# Patient Record
Sex: Male | Born: 1955
Health system: Southern US, Community
[De-identification: ages and names within clinical notes are randomized; demographics above are authoritative.]

## PROBLEM LIST (undated history)

## (undated) DIAGNOSIS — Z21 Asymptomatic human immunodeficiency virus [HIV] infection status: Secondary | ICD-10-CM

## (undated) DIAGNOSIS — E7800 Pure hypercholesterolemia, unspecified: Secondary | ICD-10-CM

## (undated) DIAGNOSIS — Z131 Encounter for screening for diabetes mellitus: Secondary | ICD-10-CM

## (undated) DIAGNOSIS — H269 Unspecified cataract: Secondary | ICD-10-CM

## (undated) DIAGNOSIS — B20 Human immunodeficiency virus [HIV] disease: Secondary | ICD-10-CM

## (undated) DIAGNOSIS — I10 Essential (primary) hypertension: Secondary | ICD-10-CM

## (undated) DIAGNOSIS — E119 Type 2 diabetes mellitus without complications: Secondary | ICD-10-CM

## (undated) DIAGNOSIS — F32A Depression, unspecified: Secondary | ICD-10-CM

## (undated) DIAGNOSIS — F419 Anxiety disorder, unspecified: Secondary | ICD-10-CM

## (undated) DIAGNOSIS — F329 Major depressive disorder, single episode, unspecified: Secondary | ICD-10-CM

## (undated) DIAGNOSIS — E785 Hyperlipidemia, unspecified: Secondary | ICD-10-CM

## (undated) HISTORY — DX: Asymptomatic human immunodeficiency virus (hiv) infection status: Z21

## (undated) HISTORY — DX: Encounter for screening for diabetes mellitus: Z13.1

## (undated) HISTORY — DX: Hyperlipidemia, unspecified: E78.5

## (undated) HISTORY — DX: Essential (primary) hypertension: I10

## (undated) HISTORY — DX: Human immunodeficiency virus (HIV) disease: B20

---

## 1995-09-12 DIAGNOSIS — B20 Human immunodeficiency virus [HIV] disease: Secondary | ICD-10-CM

## 1995-09-12 HISTORY — DX: Human immunodeficiency virus (HIV) disease: B20

## 1997-05-15 ENCOUNTER — Encounter: Admission: RE | Admit: 1997-05-15 | Discharge: 1997-05-15 | Payer: Self-pay | Admitting: Internal Medicine

## 1997-06-17 ENCOUNTER — Encounter: Admission: RE | Admit: 1997-06-17 | Discharge: 1997-06-17 | Payer: Self-pay | Admitting: Internal Medicine

## 1997-08-24 ENCOUNTER — Encounter: Admission: RE | Admit: 1997-08-24 | Discharge: 1997-08-24 | Payer: Self-pay | Admitting: Internal Medicine

## 1997-10-05 ENCOUNTER — Encounter: Admission: RE | Admit: 1997-10-05 | Discharge: 1997-10-05 | Payer: Self-pay | Admitting: Internal Medicine

## 1997-10-18 ENCOUNTER — Emergency Department (HOSPITAL_COMMUNITY): Admission: EM | Admit: 1997-10-18 | Discharge: 1997-10-18 | Payer: Self-pay | Admitting: Internal Medicine

## 1997-10-18 ENCOUNTER — Encounter: Payer: Self-pay | Admitting: Internal Medicine

## 1997-10-23 ENCOUNTER — Encounter: Admission: RE | Admit: 1997-10-23 | Discharge: 1997-10-23 | Payer: Self-pay | Admitting: Internal Medicine

## 1997-11-18 ENCOUNTER — Encounter: Admission: RE | Admit: 1997-11-18 | Discharge: 1997-11-18 | Payer: Self-pay | Admitting: Hematology and Oncology

## 1997-12-16 ENCOUNTER — Encounter: Admission: RE | Admit: 1997-12-16 | Discharge: 1997-12-16 | Payer: Self-pay | Admitting: Internal Medicine

## 1998-01-18 ENCOUNTER — Encounter: Admission: RE | Admit: 1998-01-18 | Discharge: 1998-01-18 | Payer: Self-pay | Admitting: Internal Medicine

## 1998-02-10 ENCOUNTER — Encounter: Admission: RE | Admit: 1998-02-10 | Discharge: 1998-02-10 | Payer: Self-pay | Admitting: Internal Medicine

## 1998-04-29 ENCOUNTER — Ambulatory Visit (HOSPITAL_COMMUNITY): Admission: RE | Admit: 1998-04-29 | Discharge: 1998-04-29 | Payer: Self-pay | Admitting: Internal Medicine

## 1998-04-29 ENCOUNTER — Encounter: Admission: RE | Admit: 1998-04-29 | Discharge: 1998-04-29 | Payer: Self-pay | Admitting: Internal Medicine

## 1998-05-26 ENCOUNTER — Encounter: Admission: RE | Admit: 1998-05-26 | Discharge: 1998-05-26 | Payer: Self-pay | Admitting: Internal Medicine

## 1998-07-20 ENCOUNTER — Encounter: Admission: RE | Admit: 1998-07-20 | Discharge: 1998-07-20 | Payer: Self-pay | Admitting: Internal Medicine

## 1998-09-21 ENCOUNTER — Encounter: Admission: RE | Admit: 1998-09-21 | Discharge: 1998-09-21 | Payer: Self-pay | Admitting: Internal Medicine

## 1998-11-05 ENCOUNTER — Encounter: Admission: RE | Admit: 1998-11-05 | Discharge: 1998-11-05 | Payer: Self-pay | Admitting: Internal Medicine

## 1999-01-11 ENCOUNTER — Ambulatory Visit (HOSPITAL_COMMUNITY): Admission: RE | Admit: 1999-01-11 | Discharge: 1999-01-11 | Payer: Self-pay | Admitting: Internal Medicine

## 1999-01-11 ENCOUNTER — Encounter: Admission: RE | Admit: 1999-01-11 | Discharge: 1999-01-11 | Payer: Self-pay | Admitting: Internal Medicine

## 1999-01-18 ENCOUNTER — Encounter: Admission: RE | Admit: 1999-01-18 | Discharge: 1999-01-18 | Payer: Self-pay | Admitting: Internal Medicine

## 1999-02-01 ENCOUNTER — Encounter: Admission: RE | Admit: 1999-02-01 | Discharge: 1999-02-01 | Payer: Self-pay | Admitting: Hematology and Oncology

## 1999-02-04 ENCOUNTER — Encounter: Admission: RE | Admit: 1999-02-04 | Discharge: 1999-02-04 | Payer: Self-pay | Admitting: Internal Medicine

## 1999-02-08 ENCOUNTER — Encounter: Admission: RE | Admit: 1999-02-08 | Discharge: 1999-02-08 | Payer: Self-pay | Admitting: Internal Medicine

## 1999-02-15 ENCOUNTER — Encounter: Admission: RE | Admit: 1999-02-15 | Discharge: 1999-02-15 | Payer: Self-pay | Admitting: Internal Medicine

## 1999-03-01 ENCOUNTER — Encounter: Admission: RE | Admit: 1999-03-01 | Discharge: 1999-03-01 | Payer: Self-pay | Admitting: Internal Medicine

## 1999-03-07 ENCOUNTER — Encounter (HOSPITAL_COMMUNITY): Admission: RE | Admit: 1999-03-07 | Discharge: 1999-06-05 | Payer: Self-pay | Admitting: Dentistry

## 1999-04-07 ENCOUNTER — Ambulatory Visit (HOSPITAL_COMMUNITY): Admission: RE | Admit: 1999-04-07 | Discharge: 1999-04-07 | Payer: Self-pay | Admitting: Internal Medicine

## 1999-04-07 ENCOUNTER — Encounter: Admission: RE | Admit: 1999-04-07 | Discharge: 1999-04-07 | Payer: Self-pay | Admitting: Internal Medicine

## 1999-04-28 ENCOUNTER — Encounter: Admission: RE | Admit: 1999-04-28 | Discharge: 1999-04-28 | Payer: Self-pay | Admitting: Internal Medicine

## 1999-05-06 ENCOUNTER — Encounter: Admission: RE | Admit: 1999-05-06 | Discharge: 1999-05-06 | Payer: Self-pay | Admitting: Internal Medicine

## 1999-05-10 ENCOUNTER — Encounter: Admission: RE | Admit: 1999-05-10 | Discharge: 1999-05-10 | Payer: Self-pay | Admitting: Internal Medicine

## 1999-05-26 ENCOUNTER — Encounter: Admission: RE | Admit: 1999-05-26 | Discharge: 1999-05-26 | Payer: Self-pay | Admitting: Internal Medicine

## 1999-06-12 ENCOUNTER — Encounter: Payer: Self-pay | Admitting: Emergency Medicine

## 1999-06-12 ENCOUNTER — Emergency Department (HOSPITAL_COMMUNITY): Admission: EM | Admit: 1999-06-12 | Discharge: 1999-06-12 | Payer: Self-pay | Admitting: Emergency Medicine

## 1999-06-14 ENCOUNTER — Encounter: Admission: RE | Admit: 1999-06-14 | Discharge: 1999-06-14 | Payer: Self-pay | Admitting: Internal Medicine

## 1999-09-20 ENCOUNTER — Encounter: Admission: RE | Admit: 1999-09-20 | Discharge: 1999-09-20 | Payer: Self-pay | Admitting: Internal Medicine

## 1999-09-20 ENCOUNTER — Ambulatory Visit (HOSPITAL_COMMUNITY): Admission: RE | Admit: 1999-09-20 | Discharge: 1999-09-20 | Payer: Self-pay | Admitting: Internal Medicine

## 1999-09-30 ENCOUNTER — Encounter (HOSPITAL_COMMUNITY): Admission: RE | Admit: 1999-09-30 | Discharge: 1999-12-29 | Payer: Self-pay | Admitting: Dentistry

## 1999-11-10 ENCOUNTER — Encounter: Admission: RE | Admit: 1999-11-10 | Discharge: 1999-11-10 | Payer: Self-pay | Admitting: Hematology and Oncology

## 1999-12-13 ENCOUNTER — Encounter: Admission: RE | Admit: 1999-12-13 | Discharge: 1999-12-13 | Payer: Self-pay | Admitting: Internal Medicine

## 1999-12-13 ENCOUNTER — Ambulatory Visit (HOSPITAL_COMMUNITY): Admission: RE | Admit: 1999-12-13 | Discharge: 1999-12-13 | Payer: Self-pay | Admitting: Internal Medicine

## 1999-12-27 ENCOUNTER — Encounter: Admission: RE | Admit: 1999-12-27 | Discharge: 1999-12-27 | Payer: Self-pay | Admitting: Internal Medicine

## 2000-01-24 ENCOUNTER — Encounter: Admission: RE | Admit: 2000-01-24 | Discharge: 2000-01-24 | Payer: Self-pay | Admitting: Internal Medicine

## 2000-02-03 ENCOUNTER — Encounter (HOSPITAL_COMMUNITY): Admission: RE | Admit: 2000-02-03 | Discharge: 2000-05-03 | Payer: Self-pay | Admitting: Dentistry

## 2000-02-09 ENCOUNTER — Encounter: Admission: RE | Admit: 2000-02-09 | Discharge: 2000-02-09 | Payer: Self-pay | Admitting: Hematology and Oncology

## 2000-03-06 ENCOUNTER — Encounter: Admission: RE | Admit: 2000-03-06 | Discharge: 2000-03-06 | Payer: Self-pay | Admitting: Internal Medicine

## 2000-03-06 ENCOUNTER — Ambulatory Visit (HOSPITAL_COMMUNITY): Admission: RE | Admit: 2000-03-06 | Discharge: 2000-03-06 | Payer: Self-pay | Admitting: Internal Medicine

## 2000-03-15 ENCOUNTER — Ambulatory Visit (HOSPITAL_COMMUNITY): Admission: RE | Admit: 2000-03-15 | Discharge: 2000-03-15 | Payer: Self-pay | Admitting: Internal Medicine

## 2000-04-17 ENCOUNTER — Encounter: Admission: RE | Admit: 2000-04-17 | Discharge: 2000-04-17 | Payer: Self-pay | Admitting: Internal Medicine

## 2000-06-12 ENCOUNTER — Encounter: Admission: RE | Admit: 2000-06-12 | Discharge: 2000-06-12 | Payer: Self-pay | Admitting: Internal Medicine

## 2000-07-09 ENCOUNTER — Encounter (HOSPITAL_COMMUNITY): Admission: RE | Admit: 2000-07-09 | Discharge: 2000-10-07 | Payer: Self-pay | Admitting: Dentistry

## 2000-07-27 ENCOUNTER — Encounter: Admission: RE | Admit: 2000-07-27 | Discharge: 2000-07-27 | Payer: Self-pay | Admitting: Internal Medicine

## 2000-07-27 ENCOUNTER — Ambulatory Visit (HOSPITAL_COMMUNITY): Admission: RE | Admit: 2000-07-27 | Discharge: 2000-07-27 | Payer: Self-pay | Admitting: Internal Medicine

## 2000-09-17 ENCOUNTER — Encounter: Admission: RE | Admit: 2000-09-17 | Discharge: 2000-09-17 | Payer: Self-pay | Admitting: Internal Medicine

## 2000-09-25 ENCOUNTER — Encounter: Admission: RE | Admit: 2000-09-25 | Discharge: 2000-12-24 | Payer: Self-pay | Admitting: Internal Medicine

## 2000-11-05 ENCOUNTER — Encounter: Admission: RE | Admit: 2000-11-05 | Discharge: 2000-11-05 | Payer: Self-pay

## 2001-02-11 ENCOUNTER — Encounter: Admission: RE | Admit: 2001-02-11 | Discharge: 2001-02-11 | Payer: Self-pay | Admitting: Internal Medicine

## 2001-02-11 ENCOUNTER — Ambulatory Visit (HOSPITAL_COMMUNITY): Admission: RE | Admit: 2001-02-11 | Discharge: 2001-02-11 | Payer: Self-pay | Admitting: Internal Medicine

## 2001-02-22 ENCOUNTER — Encounter (HOSPITAL_COMMUNITY): Admission: RE | Admit: 2001-02-22 | Discharge: 2001-05-23 | Payer: Self-pay | Admitting: Dentistry

## 2001-03-18 ENCOUNTER — Encounter: Admission: RE | Admit: 2001-03-18 | Discharge: 2001-03-18 | Payer: Self-pay | Admitting: Internal Medicine

## 2001-04-15 ENCOUNTER — Encounter: Admission: RE | Admit: 2001-04-15 | Discharge: 2001-04-15 | Payer: Self-pay | Admitting: Internal Medicine

## 2001-05-01 ENCOUNTER — Encounter: Admission: RE | Admit: 2001-05-01 | Discharge: 2001-05-01 | Payer: Self-pay | Admitting: Internal Medicine

## 2001-07-01 ENCOUNTER — Encounter: Admission: RE | Admit: 2001-07-01 | Discharge: 2001-07-01 | Payer: Self-pay | Admitting: Internal Medicine

## 2001-07-01 ENCOUNTER — Ambulatory Visit (HOSPITAL_COMMUNITY): Admission: RE | Admit: 2001-07-01 | Discharge: 2001-07-01 | Payer: Self-pay | Admitting: Internal Medicine

## 2001-08-01 ENCOUNTER — Encounter: Admission: RE | Admit: 2001-08-01 | Discharge: 2001-08-01 | Payer: Self-pay | Admitting: Internal Medicine

## 2001-10-14 ENCOUNTER — Ambulatory Visit (HOSPITAL_COMMUNITY): Admission: RE | Admit: 2001-10-14 | Discharge: 2001-10-14 | Payer: Self-pay | Admitting: Internal Medicine

## 2001-10-14 ENCOUNTER — Encounter: Admission: RE | Admit: 2001-10-14 | Discharge: 2001-10-14 | Payer: Self-pay | Admitting: Internal Medicine

## 2001-10-14 ENCOUNTER — Encounter (INDEPENDENT_AMBULATORY_CARE_PROVIDER_SITE_OTHER): Payer: Self-pay | Admitting: *Deleted

## 2001-10-14 LAB — CONVERTED CEMR LAB: HIV 1 RNA Quant: 97 copies/mL

## 2001-12-09 ENCOUNTER — Encounter: Admission: RE | Admit: 2001-12-09 | Discharge: 2001-12-09 | Payer: Self-pay | Admitting: Internal Medicine

## 2001-12-16 ENCOUNTER — Encounter: Admission: RE | Admit: 2001-12-16 | Discharge: 2001-12-16 | Payer: Self-pay | Admitting: Internal Medicine

## 2002-01-29 ENCOUNTER — Ambulatory Visit (HOSPITAL_COMMUNITY): Admission: RE | Admit: 2002-01-29 | Discharge: 2002-01-29 | Payer: Self-pay | Admitting: Internal Medicine

## 2002-01-29 ENCOUNTER — Encounter (INDEPENDENT_AMBULATORY_CARE_PROVIDER_SITE_OTHER): Payer: Self-pay | Admitting: *Deleted

## 2002-01-29 ENCOUNTER — Encounter: Admission: RE | Admit: 2002-01-29 | Discharge: 2002-01-29 | Payer: Self-pay | Admitting: Internal Medicine

## 2002-01-29 LAB — CONVERTED CEMR LAB: CD4 Count: 620 microliters

## 2002-02-17 ENCOUNTER — Encounter: Admission: RE | Admit: 2002-02-17 | Discharge: 2002-02-17 | Payer: Self-pay | Admitting: Internal Medicine

## 2002-03-31 ENCOUNTER — Encounter: Admission: RE | Admit: 2002-03-31 | Discharge: 2002-03-31 | Payer: Self-pay | Admitting: Internal Medicine

## 2002-03-31 ENCOUNTER — Encounter (INDEPENDENT_AMBULATORY_CARE_PROVIDER_SITE_OTHER): Payer: Self-pay | Admitting: *Deleted

## 2002-03-31 LAB — CONVERTED CEMR LAB: CD4 Count: 590 microliters

## 2002-05-06 ENCOUNTER — Encounter: Admission: RE | Admit: 2002-05-06 | Discharge: 2002-05-06 | Payer: Self-pay | Admitting: Dentistry

## 2002-05-19 ENCOUNTER — Encounter: Admission: RE | Admit: 2002-05-19 | Discharge: 2002-05-19 | Payer: Self-pay | Admitting: Internal Medicine

## 2002-07-21 ENCOUNTER — Encounter: Payer: Self-pay | Admitting: Internal Medicine

## 2002-07-21 ENCOUNTER — Encounter: Admission: RE | Admit: 2002-07-21 | Discharge: 2002-07-21 | Payer: Self-pay | Admitting: Internal Medicine

## 2002-07-21 ENCOUNTER — Ambulatory Visit (HOSPITAL_COMMUNITY): Admission: RE | Admit: 2002-07-21 | Discharge: 2002-07-21 | Payer: Self-pay | Admitting: Internal Medicine

## 2002-07-25 ENCOUNTER — Encounter: Admission: RE | Admit: 2002-07-25 | Discharge: 2002-07-25 | Payer: Self-pay | Admitting: Internal Medicine

## 2002-09-18 ENCOUNTER — Encounter: Admission: RE | Admit: 2002-09-18 | Discharge: 2002-09-18 | Payer: Self-pay | Admitting: Internal Medicine

## 2002-10-15 ENCOUNTER — Encounter: Admission: RE | Admit: 2002-10-15 | Discharge: 2002-10-15 | Payer: Self-pay | Admitting: Internal Medicine

## 2002-11-27 ENCOUNTER — Encounter: Admission: RE | Admit: 2002-11-27 | Discharge: 2002-11-27 | Payer: Self-pay | Admitting: Internal Medicine

## 2002-11-30 ENCOUNTER — Encounter (INDEPENDENT_AMBULATORY_CARE_PROVIDER_SITE_OTHER): Payer: Self-pay | Admitting: *Deleted

## 2002-11-30 LAB — CONVERTED CEMR LAB: CD4 T Cell Abs: 620

## 2003-01-10 HISTORY — PX: OTHER SURGICAL HISTORY: SHX169

## 2003-01-10 HISTORY — PX: EYE SURGERY: SHX253

## 2003-01-22 ENCOUNTER — Ambulatory Visit (HOSPITAL_COMMUNITY): Admission: RE | Admit: 2003-01-22 | Discharge: 2003-01-22 | Payer: Self-pay | Admitting: Internal Medicine

## 2003-01-22 ENCOUNTER — Encounter: Admission: RE | Admit: 2003-01-22 | Discharge: 2003-01-22 | Payer: Self-pay | Admitting: Internal Medicine

## 2003-02-27 ENCOUNTER — Encounter (HOSPITAL_COMMUNITY): Admission: RE | Admit: 2003-02-27 | Discharge: 2003-02-27 | Payer: Self-pay | Admitting: Dentistry

## 2003-03-12 ENCOUNTER — Encounter: Admission: RE | Admit: 2003-03-12 | Discharge: 2003-03-12 | Payer: Self-pay | Admitting: Internal Medicine

## 2003-05-15 ENCOUNTER — Encounter: Admission: RE | Admit: 2003-05-15 | Discharge: 2003-05-15 | Payer: Self-pay | Admitting: Internal Medicine

## 2003-05-22 ENCOUNTER — Encounter: Admission: RE | Admit: 2003-05-22 | Discharge: 2003-05-22 | Payer: Self-pay | Admitting: Internal Medicine

## 2003-05-22 ENCOUNTER — Ambulatory Visit (HOSPITAL_COMMUNITY): Admission: RE | Admit: 2003-05-22 | Discharge: 2003-05-22 | Payer: Self-pay | Admitting: Internal Medicine

## 2003-07-07 ENCOUNTER — Encounter: Admission: RE | Admit: 2003-07-07 | Discharge: 2003-07-07 | Payer: Self-pay | Admitting: Internal Medicine

## 2003-09-28 ENCOUNTER — Ambulatory Visit: Payer: Self-pay | Admitting: Internal Medicine

## 2003-10-09 ENCOUNTER — Ambulatory Visit: Payer: Self-pay | Admitting: Dentistry

## 2003-10-14 ENCOUNTER — Ambulatory Visit: Payer: Self-pay | Admitting: Internal Medicine

## 2003-10-15 ENCOUNTER — Ambulatory Visit: Payer: Self-pay | Admitting: *Deleted

## 2003-11-25 ENCOUNTER — Ambulatory Visit: Payer: Self-pay | Admitting: Internal Medicine

## 2004-01-06 ENCOUNTER — Ambulatory Visit: Payer: Self-pay | Admitting: Internal Medicine

## 2004-03-04 ENCOUNTER — Ambulatory Visit: Payer: Self-pay | Admitting: Internal Medicine

## 2004-04-29 ENCOUNTER — Ambulatory Visit: Payer: Self-pay | Admitting: Dentistry

## 2004-04-29 ENCOUNTER — Encounter: Admission: RE | Admit: 2004-04-29 | Discharge: 2004-04-29 | Payer: Self-pay | Admitting: Dentistry

## 2004-05-31 ENCOUNTER — Ambulatory Visit: Payer: Self-pay | Admitting: Internal Medicine

## 2004-07-19 ENCOUNTER — Ambulatory Visit: Payer: Self-pay | Admitting: Internal Medicine

## 2004-08-20 ENCOUNTER — Emergency Department (HOSPITAL_COMMUNITY): Admission: EM | Admit: 2004-08-20 | Discharge: 2004-08-20 | Payer: Self-pay | Admitting: Emergency Medicine

## 2004-09-14 ENCOUNTER — Ambulatory Visit: Payer: Self-pay | Admitting: Internal Medicine

## 2004-11-15 ENCOUNTER — Ambulatory Visit: Payer: Self-pay | Admitting: Internal Medicine

## 2004-11-29 ENCOUNTER — Ambulatory Visit: Payer: Self-pay | Admitting: Internal Medicine

## 2005-01-06 ENCOUNTER — Ambulatory Visit: Payer: Self-pay | Admitting: Internal Medicine

## 2005-01-20 ENCOUNTER — Ambulatory Visit: Payer: Self-pay | Admitting: Dentistry

## 2005-07-21 ENCOUNTER — Ambulatory Visit: Payer: Self-pay | Admitting: Dentistry

## 2005-07-28 ENCOUNTER — Ambulatory Visit: Payer: Self-pay | Admitting: Internal Medicine

## 2005-08-25 ENCOUNTER — Ambulatory Visit: Payer: Self-pay | Admitting: Internal Medicine

## 2005-11-06 ENCOUNTER — Ambulatory Visit: Payer: Self-pay | Admitting: Internal Medicine

## 2005-12-11 ENCOUNTER — Ambulatory Visit: Payer: Self-pay | Admitting: Internal Medicine

## 2006-01-29 ENCOUNTER — Ambulatory Visit: Payer: Self-pay | Admitting: Internal Medicine

## 2006-02-01 ENCOUNTER — Ambulatory Visit: Payer: Self-pay | Admitting: Dentistry

## 2006-02-26 ENCOUNTER — Ambulatory Visit: Payer: Self-pay | Admitting: Internal Medicine

## 2006-03-05 ENCOUNTER — Encounter (INDEPENDENT_AMBULATORY_CARE_PROVIDER_SITE_OTHER): Payer: Self-pay | Admitting: *Deleted

## 2006-03-05 LAB — CONVERTED CEMR LAB

## 2006-03-18 ENCOUNTER — Encounter (INDEPENDENT_AMBULATORY_CARE_PROVIDER_SITE_OTHER): Payer: Self-pay | Admitting: *Deleted

## 2006-03-29 ENCOUNTER — Ambulatory Visit: Payer: Self-pay | Admitting: Internal Medicine

## 2006-04-09 ENCOUNTER — Ambulatory Visit: Payer: Self-pay | Admitting: Internal Medicine

## 2006-06-11 ENCOUNTER — Ambulatory Visit: Payer: Self-pay | Admitting: Internal Medicine

## 2006-08-14 ENCOUNTER — Emergency Department (HOSPITAL_COMMUNITY): Admission: EM | Admit: 2006-08-14 | Discharge: 2006-08-14 | Payer: Self-pay | Admitting: Emergency Medicine

## 2006-08-22 ENCOUNTER — Ambulatory Visit: Payer: Self-pay | Admitting: Internal Medicine

## 2006-08-31 ENCOUNTER — Ambulatory Visit: Payer: Self-pay | Admitting: Dentistry

## 2006-09-12 ENCOUNTER — Ambulatory Visit: Payer: Self-pay | Admitting: Internal Medicine

## 2006-09-12 LAB — CONVERTED CEMR LAB
BUN: 13 mg/dL (ref 6–23)
Chloride: 103 meq/L (ref 96–112)
Creatinine, Ser: 0.85 mg/dL (ref 0.40–1.50)
Glucose, Bld: 173 mg/dL — ABNORMAL HIGH (ref 70–99)
Potassium: 4.7 meq/L (ref 3.5–5.3)

## 2006-09-18 ENCOUNTER — Ambulatory Visit (HOSPITAL_COMMUNITY): Admission: RE | Admit: 2006-09-18 | Discharge: 2006-09-18 | Payer: Self-pay | Admitting: Internal Medicine

## 2006-10-01 ENCOUNTER — Ambulatory Visit: Payer: Self-pay | Admitting: Internal Medicine

## 2006-10-19 ENCOUNTER — Ambulatory Visit: Payer: Self-pay | Admitting: Internal Medicine

## 2006-12-12 ENCOUNTER — Ambulatory Visit: Payer: Self-pay | Admitting: Internal Medicine

## 2006-12-13 ENCOUNTER — Encounter (INDEPENDENT_AMBULATORY_CARE_PROVIDER_SITE_OTHER): Payer: Self-pay | Admitting: Internal Medicine

## 2006-12-13 LAB — CONVERTED CEMR LAB
ALT: 33 units/L (ref 0–53)
AST: 40 units/L — ABNORMAL HIGH (ref 0–37)
Absolute CD4: 599 #/uL (ref 381–1469)
Albumin: 4.5 g/dL (ref 3.5–5.2)
Alkaline Phosphatase: 53 units/L (ref 39–117)
Basophils Absolute: 0 10*3/uL (ref 0.0–0.1)
Basophils Relative: 0 % (ref 0–1)
CD4 T Helper %: 25 % — ABNORMAL LOW (ref 32–62)
Eosinophils Absolute: 0.1 10*3/uL — ABNORMAL LOW (ref 0.2–0.7)
MCHC: 34.4 g/dL (ref 30.0–36.0)
MCV: 110 fL — ABNORMAL HIGH (ref 78.0–100.0)
Neutrophils Relative %: 53 % (ref 43–77)
Platelets: 144 10*3/uL — ABNORMAL LOW (ref 150–400)
Potassium: 4.2 meq/L (ref 3.5–5.3)
Sodium: 140 meq/L (ref 135–145)
Total Protein: 8 g/dL (ref 6.0–8.3)
WBC, lymph enumeration: 6.3 10*3/uL (ref 4.0–10.5)
WBC: 6.3 10*3/uL (ref 4.0–10.5)

## 2007-02-06 ENCOUNTER — Ambulatory Visit: Payer: Self-pay | Admitting: Internal Medicine

## 2007-03-27 ENCOUNTER — Ambulatory Visit: Payer: Self-pay | Admitting: Internal Medicine

## 2007-05-15 ENCOUNTER — Ambulatory Visit: Payer: Self-pay | Admitting: Internal Medicine

## 2007-05-16 ENCOUNTER — Encounter (INDEPENDENT_AMBULATORY_CARE_PROVIDER_SITE_OTHER): Payer: Self-pay | Admitting: Internal Medicine

## 2007-05-16 LAB — CONVERTED CEMR LAB
Absolute CD4: 692 #/uL (ref 381–1469)
Cholesterol: 151 mg/dL (ref 0–200)
HDL: 46 mg/dL (ref >39)
Microalb, Ur: 0.86 mg/dL (ref 0.00–1.89)
Total Lymphocytes %: 43 % (ref 12–46)
Triglycerides: 91 mg/dL (ref <150)
WBC, lymph enumeration: 7 10*3/uL (ref 4.0–10.5)

## 2007-07-17 ENCOUNTER — Ambulatory Visit: Payer: Self-pay | Admitting: Internal Medicine

## 2007-09-18 ENCOUNTER — Ambulatory Visit: Payer: Self-pay | Admitting: Internal Medicine

## 2007-09-18 LAB — CONVERTED CEMR LAB
Basophils Absolute: 0 10*3/uL (ref 0.0–0.1)
HIV 1 RNA Quant: 161 copies/mL — ABNORMAL HIGH (ref <50)
HIV-1 RNA Quant, Log: 2.21 — ABNORMAL HIGH (ref <1.70)
Hemoglobin: 15.4 g/dL (ref 13.0–17.0)
Lymphocytes Relative: 36 % (ref 12–46)
Monocytes Absolute: 0.5 10*3/uL (ref 0.1–1.0)
Neutro Abs: 4.2 10*3/uL (ref 1.7–7.7)
Platelets: 146 10*3/uL — ABNORMAL LOW (ref 150–400)
RDW: 14 % (ref 11.5–15.5)
Total Lymphocytes %: 36 % (ref 12–46)
WBC, lymph enumeration: 7.5 10*3/uL (ref 4.0–10.5)

## 2007-11-21 ENCOUNTER — Ambulatory Visit: Payer: Self-pay | Admitting: Internal Medicine

## 2007-11-21 LAB — CONVERTED CEMR LAB
ALT: 29 units/L (ref 0–53)
Absolute CD4: 693 #/uL (ref 381–1469)
Alkaline Phosphatase: 53 units/L (ref 39–117)
Basophils Absolute: 0 10*3/uL (ref 0.0–0.1)
Creatinine, Ser: 0.85 mg/dL (ref 0.40–1.50)
Eosinophils Absolute: 0.1 10*3/uL (ref 0.0–0.7)
Eosinophils Relative: 1 % (ref 0–5)
Glucose, Bld: 267 mg/dL — ABNORMAL HIGH (ref 70–99)
HCT: 45.3 % (ref 39.0–52.0)
LDL Cholesterol: 89 mg/dL (ref 0–99)
MCHC: 35.3 g/dL (ref 30.0–36.0)
MCV: 110.8 fL — ABNORMAL HIGH (ref 78.0–100.0)
Platelets: 168 10*3/uL (ref 150–400)
RDW: 14 % (ref 11.5–15.5)
Sodium: 137 meq/L (ref 135–145)
Total Bilirubin: 0.3 mg/dL (ref 0.3–1.2)
Total CHOL/HDL Ratio: 3.6
Total Protein: 8.2 g/dL (ref 6.0–8.3)
Triglycerides: 147 mg/dL (ref <150)
VLDL: 29 mg/dL (ref 0–40)
WBC, lymph enumeration: 6.5 10*3/uL (ref 4.0–10.5)

## 2008-01-15 ENCOUNTER — Ambulatory Visit: Payer: Self-pay | Admitting: Internal Medicine

## 2008-01-16 ENCOUNTER — Encounter (INDEPENDENT_AMBULATORY_CARE_PROVIDER_SITE_OTHER): Payer: Self-pay | Admitting: Internal Medicine

## 2008-01-16 LAB — CONVERTED CEMR LAB
Absolute CD4: 786 #/uL (ref 381–1469)
BUN: 8 mg/dL (ref 6–23)
Basophils Absolute: 0 10*3/uL (ref 0.0–0.1)
CD4 T Helper %: 27 % — ABNORMAL LOW (ref 32–62)
CO2: 23 meq/L (ref 19–32)
Calcium: 9.4 mg/dL (ref 8.4–10.5)
Chloride: 99 meq/L (ref 96–112)
Cholesterol: 143 mg/dL (ref 0–200)
Creatinine, Ser: 0.87 mg/dL (ref 0.40–1.50)
Eosinophils Relative: 2 % (ref 0–5)
Glucose, Bld: 239 mg/dL — ABNORMAL HIGH (ref 70–99)
HCT: 44.9 % (ref 39.0–52.0)
HDL: 41 mg/dL (ref >39)
HIV-1 RNA Quant, Log: <1.68 (ref <1.68)
Hemoglobin: 16 g/dL (ref 13.0–17.0)
Lymphocytes Relative: 41 % (ref 12–46)
Lymphs Abs: 2.9 10*3/uL (ref 0.7–4.0)
Monocytes Absolute: 0.6 10*3/uL (ref 0.1–1.0)
Monocytes Relative: 8 % (ref 3–12)
Neutro Abs: 3.5 10*3/uL (ref 1.7–7.7)
RBC: 4.14 M/uL — ABNORMAL LOW (ref 4.22–5.81)
RDW: 13.9 % (ref 11.5–15.5)
Total Bilirubin: 0.3 mg/dL (ref 0.3–1.2)
Total CHOL/HDL Ratio: 3.5
Total lymphocyte count: 2911 cells/mcL (ref 700–3300)
Triglycerides: 118 mg/dL (ref <150)
VLDL: 24 mg/dL (ref 0–40)

## 2008-03-25 ENCOUNTER — Ambulatory Visit: Payer: Self-pay | Admitting: Internal Medicine

## 2008-05-18 ENCOUNTER — Ambulatory Visit: Payer: Self-pay | Admitting: Internal Medicine

## 2008-05-18 LAB — CONVERTED CEMR LAB
ALT: 23 units/L (ref 0–53)
Absolute CD4: 808 #/uL (ref 381–1469)
Albumin: 4.5 g/dL (ref 3.5–5.2)
Alkaline Phosphatase: 51 units/L (ref 39–117)
Basophils Absolute: 0 10*3/uL (ref 0.0–0.1)
Basophils Relative: 0 % (ref 0–1)
CD4 T Helper %: 26 % — ABNORMAL LOW (ref 32–62)
Hemoglobin: 15.1 g/dL (ref 13.0–17.0)
Lymphocytes Relative: 42 % (ref 12–46)
MCHC: 34.8 g/dL (ref 30.0–36.0)
Monocytes Absolute: 0.5 10*3/uL (ref 0.1–1.0)
Monocytes Relative: 7 % (ref 3–12)
Neutro Abs: 3.7 10*3/uL (ref 1.7–7.7)
Neutrophils Relative %: 50 % (ref 43–77)
Potassium: 4.9 meq/L (ref 3.5–5.3)
RBC: 3.92 M/uL — ABNORMAL LOW (ref 4.22–5.81)
Sodium: 138 meq/L (ref 135–145)
Total Bilirubin: 0.3 mg/dL (ref 0.3–1.2)
Total Lymphocytes %: 42 % (ref 12–46)
Total Protein: 7.9 g/dL (ref 6.0–8.3)
Total lymphocyte count: 3108 cells/mcL (ref 700–3300)

## 2008-06-03 ENCOUNTER — Encounter (INDEPENDENT_AMBULATORY_CARE_PROVIDER_SITE_OTHER): Payer: Self-pay | Admitting: Internal Medicine

## 2008-06-03 LAB — CONVERTED CEMR LAB
Chlamydia, Swab/Urine, PCR: NEGATIVE
GC Probe Amp, Urine: NEGATIVE

## 2008-08-12 ENCOUNTER — Ambulatory Visit: Payer: Self-pay | Admitting: Internal Medicine

## 2008-08-12 LAB — CONVERTED CEMR LAB
Albumin: 4.9 g/dL (ref 3.5–5.2)
Alkaline Phosphatase: 43 units/L (ref 39–117)
BUN: 11 mg/dL (ref 6–23)
Basophils Absolute: 0 10*3/uL (ref 0.0–0.1)
Basophils Relative: 0 % (ref 0–1)
CD4 T Helper %: 29 % — ABNORMAL LOW (ref 32–62)
Creatinine, Ser: 0.86 mg/dL (ref 0.40–1.50)
Eosinophils Absolute: 0.1 10*3/uL (ref 0.0–0.7)
Eosinophils Relative: 2 % (ref 0–5)
Glucose, Bld: 219 mg/dL — ABNORMAL HIGH (ref 70–99)
HCT: 46 % (ref 39.0–52.0)
HIV-1 RNA Quant, Log: 2.46 — ABNORMAL HIGH (ref <1.68)
Hemoglobin: 15.6 g/dL (ref 13.0–17.0)
Lymphocytes Relative: 40 % (ref 12–46)
MCHC: 33.9 g/dL (ref 30.0–36.0)
MCV: 116.2 fL — ABNORMAL HIGH (ref 78.0–100.0)
Monocytes Absolute: 0.6 10*3/uL (ref 0.1–1.0)
Platelets: 147 10*3/uL — ABNORMAL LOW (ref 150–400)
Potassium: 4.1 meq/L (ref 3.5–5.3)
RDW: 14.7 % (ref 11.5–15.5)
Total lymphocyte count: 2920 cells/mcL (ref 700–3300)

## 2008-11-12 ENCOUNTER — Ambulatory Visit: Payer: Self-pay | Admitting: Internal Medicine

## 2008-11-13 ENCOUNTER — Encounter (INDEPENDENT_AMBULATORY_CARE_PROVIDER_SITE_OTHER): Payer: Self-pay | Admitting: Internal Medicine

## 2008-11-13 LAB — CONVERTED CEMR LAB
ALT: 25 units/L (ref 0–53)
AST: 31 units/L (ref 0–37)
Alkaline Phosphatase: 51 units/L (ref 39–117)
Basophils Relative: 0 % (ref 0–1)
Creatinine, Ser: 0.78 mg/dL (ref 0.40–1.50)
Eosinophils Absolute: 0.1 10*3/uL (ref 0.0–0.7)
HIV-1 antibody: POSITIVE — AB
HIV: REACTIVE
MCHC: 34.2 g/dL (ref 30.0–36.0)
MCV: 113.7 fL — ABNORMAL HIGH (ref 78.0–100.0)
Neutrophils Relative %: 50 % (ref 43–77)
Platelets: 148 10*3/uL — ABNORMAL LOW (ref 150–400)
Total Bilirubin: 0.3 mg/dL (ref 0.3–1.2)
Total CHOL/HDL Ratio: 3.4
Total lymphocyte count: 2814 cells/mcL (ref 700–3300)
VLDL: 26 mg/dL (ref 0–40)
WBC: 6.7 10*3/uL (ref 4.0–10.5)

## 2009-01-12 ENCOUNTER — Ambulatory Visit: Payer: Self-pay | Admitting: Internal Medicine

## 2009-03-09 ENCOUNTER — Ambulatory Visit: Payer: Self-pay | Admitting: Internal Medicine

## 2009-03-09 LAB — CONVERTED CEMR LAB
ALT: 22 units/L (ref 0–53)
Albumin: 4.8 g/dL (ref 3.5–5.2)
CO2: 25 meq/L (ref 19–32)
Calcium: 9.4 mg/dL (ref 8.4–10.5)
Chloride: 99 meq/L (ref 96–112)
Creatinine, Ser: 0.75 mg/dL (ref 0.40–1.50)
Eosinophils Absolute: 0.1 10*3/uL (ref 0.0–0.7)
HCT: 47.5 % (ref 39.0–52.0)
Hgb A1c MFr Bld: 9.8 % — ABNORMAL HIGH (ref 4.6–6.1)
Lymphs Abs: 2.7 10*3/uL (ref 0.7–4.0)
MCV: 111.5 fL — ABNORMAL HIGH (ref 78.0–100.0)
Monocytes Relative: 7 % (ref 3–12)
Neutrophils Relative %: 51 % (ref 43–77)
Potassium: 4.5 meq/L (ref 3.5–5.3)
RBC: 4.26 M/uL (ref 4.22–5.81)
Total Protein: 8.2 g/dL (ref 6.0–8.3)
Total lymphocyte count: 2747 cells/mcL (ref 700–3300)
WBC: 6.7 10*3/uL (ref 4.0–10.5)

## 2009-05-13 ENCOUNTER — Ambulatory Visit: Payer: Self-pay | Admitting: Internal Medicine

## 2009-06-14 ENCOUNTER — Ambulatory Visit: Payer: Self-pay | Admitting: Internal Medicine

## 2009-06-15 ENCOUNTER — Encounter (INDEPENDENT_AMBULATORY_CARE_PROVIDER_SITE_OTHER): Payer: Self-pay | Admitting: Internal Medicine

## 2009-06-15 LAB — CONVERTED CEMR LAB
Absolute CD4: 806 #/uL (ref 381–1469)
Basophils Relative: 0 % (ref 0–1)
CO2: 25 meq/L (ref 19–32)
Calcium: 9.4 mg/dL (ref 8.4–10.5)
Chloride: 100 meq/L (ref 96–112)
Cholesterol: 165 mg/dL (ref 0–200)
Creatinine, Ser: 0.86 mg/dL (ref 0.40–1.50)
Eosinophils Absolute: 0.1 10*3/uL (ref 0.0–0.7)
Glucose, Bld: 297 mg/dL — ABNORMAL HIGH (ref 70–99)
HCT: 46.8 % (ref 39.0–52.0)
HIV-1 RNA Quant, Log: <1.68 (ref <1.68)
Hemoglobin: 15.7 g/dL (ref 13.0–17.0)
Lymphs Abs: 2.8 10*3/uL (ref 0.7–4.0)
MCHC: 33.5 g/dL (ref 30.0–36.0)
MCV: 113.6 fL — ABNORMAL HIGH (ref 78.0–100.0)
Monocytes Absolute: 0.5 10*3/uL (ref 0.1–1.0)
Monocytes Relative: 7 % (ref 3–12)
RBC: 4.12 M/uL — ABNORMAL LOW (ref 4.22–5.81)
Total Bilirubin: 0.4 mg/dL (ref 0.3–1.2)
Total CHOL/HDL Ratio: 4
Total Protein: 8.2 g/dL (ref 6.0–8.3)
Total lymphocyte count: 2880 cells/mcL (ref 700–3300)
Triglycerides: 206 mg/dL — ABNORMAL HIGH (ref <150)
VLDL: 41 mg/dL — ABNORMAL HIGH (ref 0–40)

## 2009-08-12 ENCOUNTER — Ambulatory Visit: Payer: Self-pay | Admitting: Internal Medicine

## 2009-08-12 LAB — CONVERTED CEMR LAB: Microalb, Ur: 1.79 mg/dL (ref 0.00–1.89)

## 2009-10-11 ENCOUNTER — Encounter (INDEPENDENT_AMBULATORY_CARE_PROVIDER_SITE_OTHER): Payer: Self-pay | Admitting: Internal Medicine

## 2009-10-11 LAB — CONVERTED CEMR LAB
ALT: 29 units/L (ref 0–53)
AST: 34 units/L (ref 0–37)
Albumin: 5.1 g/dL (ref 3.5–5.2)
BUN: 8 mg/dL (ref 6–23)
Calcium: 10 mg/dL (ref 8.4–10.5)
Chloride: 104 meq/L (ref 96–112)
HIV-1 RNA Quant, Log: 2.19 — ABNORMAL HIGH (ref <1.30)
Potassium: 4.5 meq/L (ref 3.5–5.3)

## 2010-08-17 ENCOUNTER — Other Ambulatory Visit: Payer: Self-pay | Admitting: Internal Medicine

## 2010-08-17 DIAGNOSIS — I10 Essential (primary) hypertension: Secondary | ICD-10-CM | POA: Insufficient documentation

## 2010-08-17 DIAGNOSIS — Z79899 Other long term (current) drug therapy: Secondary | ICD-10-CM

## 2010-08-17 DIAGNOSIS — E1140 Type 2 diabetes mellitus with diabetic neuropathy, unspecified: Secondary | ICD-10-CM | POA: Insufficient documentation

## 2010-08-17 DIAGNOSIS — Z113 Encounter for screening for infections with a predominantly sexual mode of transmission: Secondary | ICD-10-CM

## 2010-08-17 DIAGNOSIS — B20 Human immunodeficiency virus [HIV] disease: Secondary | ICD-10-CM

## 2010-08-17 DIAGNOSIS — Z131 Encounter for screening for diabetes mellitus: Secondary | ICD-10-CM

## 2010-08-17 DIAGNOSIS — E785 Hyperlipidemia, unspecified: Secondary | ICD-10-CM

## 2010-08-18 ENCOUNTER — Other Ambulatory Visit: Payer: Medicare Other

## 2010-08-18 ENCOUNTER — Other Ambulatory Visit: Payer: Self-pay

## 2010-08-18 ENCOUNTER — Other Ambulatory Visit: Payer: Self-pay | Admitting: Internal Medicine

## 2010-08-18 DIAGNOSIS — Z79899 Other long term (current) drug therapy: Secondary | ICD-10-CM

## 2010-08-18 DIAGNOSIS — B20 Human immunodeficiency virus [HIV] disease: Secondary | ICD-10-CM

## 2010-08-18 DIAGNOSIS — Z113 Encounter for screening for infections with a predominantly sexual mode of transmission: Secondary | ICD-10-CM

## 2010-08-18 LAB — COMPREHENSIVE METABOLIC PANEL
ALT: 26 U/L (ref 0–53)
AST: 32 U/L (ref 0–37)
Albumin: 4.5 g/dL (ref 3.5–5.2)
Calcium: 10 mg/dL (ref 8.4–10.5)
Chloride: 104 mEq/L (ref 96–112)
Potassium: 4.3 mEq/L (ref 3.5–5.3)
Total Protein: 7.7 g/dL (ref 6.0–8.3)

## 2010-08-18 LAB — URINALYSIS
Ketones, ur: NEGATIVE mg/dL
Leukocytes, UA: NEGATIVE
Nitrite: NEGATIVE
Specific Gravity, Urine: 1.03 (ref 1.005–1.030)
pH: 5.5 (ref 5.0–8.0)

## 2010-08-18 LAB — LIPID PANEL
Total CHOL/HDL Ratio: 3.3 Ratio
VLDL: 36 mg/dL (ref 0–40)

## 2010-08-19 LAB — CBC WITH DIFFERENTIAL/PLATELET
Basophils Absolute: 0 10*3/uL (ref 0.0–0.1)
HCT: 43.3 % (ref 39.0–52.0)
Lymphocytes Relative: 39 % (ref 12–46)
Monocytes Absolute: 0.4 10*3/uL (ref 0.1–1.0)
Neutro Abs: 4.5 10*3/uL (ref 1.7–7.7)
Platelets: 150 10*3/uL (ref 150–400)
RDW: 13.9 % (ref 11.5–15.5)
WBC: 8.1 10*3/uL (ref 4.0–10.5)

## 2010-08-19 LAB — HEPATITIS C ANTIBODY: HCV Ab: REACTIVE — AB

## 2010-08-19 LAB — T-HELPER CELL (CD4) - (RCID CLINIC ONLY): CD4 % Helper T Cell: 27 % — ABNORMAL LOW (ref 33–55)

## 2010-08-19 LAB — GC/CHLAMYDIA PROBE AMP, URINE
Chlamydia, Swab/Urine, PCR: NEGATIVE
GC Probe Amp, Urine: NEGATIVE

## 2010-09-02 ENCOUNTER — Telehealth: Payer: Self-pay | Admitting: *Deleted

## 2010-09-02 ENCOUNTER — Ambulatory Visit (INDEPENDENT_AMBULATORY_CARE_PROVIDER_SITE_OTHER): Payer: Medicare Other | Admitting: Internal Medicine

## 2010-09-02 ENCOUNTER — Encounter: Payer: Self-pay | Admitting: Internal Medicine

## 2010-09-02 VITALS — BP 119/74 | HR 69 | Temp 98.2°F | Ht 73.0 in | Wt 171.0 lb

## 2010-09-02 DIAGNOSIS — B20 Human immunodeficiency virus [HIV] disease: Secondary | ICD-10-CM

## 2010-09-02 DIAGNOSIS — Z23 Encounter for immunization: Secondary | ICD-10-CM

## 2010-09-02 MED ORDER — EFAVIRENZ-EMTRICITAB-TENOFOVIR 600-200-300 MG PO TABS: 1.0000 | ORAL_TABLET | Freq: Every day | ORAL | Status: DC

## 2010-09-02 NOTE — Assessment & Plan Note (Signed)
I discussed with the patient at length different treatment options for his HIV including Atripla.  He has never been on any other regimen since starting his current regimen so should not have any resistance mutations.  I discussed the long term effects of medicines including cardiac risks and better options with tenofovir and emtricitabine and he is in agreement with switching.  I have given him a new prescription and he is to take that once it is filled.  I also discussed condom use with sexual activity.  I am going to recheck his virus in 6 weeks to assure continued immune suppression and see him at that time to assure he is getting a PCP and other needs.     He was referred to [facility] for a new PCP.     Records are going to be obtained from [facility].

## 2010-09-02 NOTE — Telephone Encounter (Signed)
Notified patient of appointment with Internal Medicine, [place] [facility], Dr. Nehemiah Settle for 09/09/10 at 2:15 PM Wendall Mola CMA

## 2010-09-02 NOTE — Telephone Encounter (Signed)
Faxed medical release for patient's records from [facility]. Wendall Mola CMA

## 2010-09-02 NOTE — Progress Notes (Signed)
  Subjective:    Patient ID: Nicholas Whitehead, male    DOB: 11/19/1955, 55 y.o.   MRN: [id number]  HPI new patient here for his first visit.  55 yo male with a history of HIV diagnosed in 1997 with a CD4 nadir at that time of 20.  His risk factors are MSM, no history of drug abuse.  He has been followed in [facility] for the last 10 years approximately but his physician left and so is looking for a new physician.  He has diabetes and hypertension.  He has been on a regimen of efavirenz, epivir and Zerit for about 10 years and has been doing well with no significant side effects.  His virus today is undetectable though has had some low level viremia since at least 2008 (no records available).  His CD4 has remained good during that time.  He tells me he has tolerated that regimen well.      Review of Systems  Constitutional: Negative.   HENT: Negative.   Eyes: Negative.   Respiratory: Negative.   Cardiovascular: Negative.   Gastrointestinal: Negative.   Genitourinary: Negative.   Musculoskeletal: Negative.   Skin: Negative.   Neurological: Negative.   Hematological: Negative.   Psychiatric/Behavioral: Negative.        Objective:   Physical Exam  Constitutional: He is oriented to person, place, and time. He appears well-developed and well-nourished.  HENT:  Mouth/Throat: Oropharynx is clear and moist. No oropharyngeal exudate.  Cardiovascular: Normal rate, regular rhythm and normal heart sounds.   No murmur heard. Pulmonary/Chest: Effort normal and breath sounds normal. No respiratory distress. He has no wheezes.  Abdominal: Soft. Bowel sounds are normal. He exhibits no distension.  Lymphadenopathy:    He has no cervical adenopathy.  Neurological: He is alert and oriented to person, place, and time.  Skin: Skin is warm and dry. No erythema.  Psychiatric: He has a normal mood and affect. His behavior is normal.          Assessment & Plan:

## 2010-10-03 ENCOUNTER — Other Ambulatory Visit: Payer: Self-pay | Admitting: Internal Medicine

## 2010-10-03 ENCOUNTER — Other Ambulatory Visit (INDEPENDENT_AMBULATORY_CARE_PROVIDER_SITE_OTHER): Payer: Medicare Other

## 2010-10-03 DIAGNOSIS — B20 Human immunodeficiency virus [HIV] disease: Secondary | ICD-10-CM

## 2010-10-03 LAB — COMPLETE METABOLIC PANEL WITH GFR
Alkaline Phosphatase: 59 U/L (ref 39–117)
BUN: 14 mg/dL (ref 6–23)
Creat: 0.99 mg/dL (ref 0.50–1.35)
GFR, Est Non African American: >60 mL/min (ref >60)
Glucose, Bld: 300 mg/dL — ABNORMAL HIGH (ref 70–99)
Total Bilirubin: 0.3 mg/dL (ref 0.3–1.2)

## 2010-10-03 LAB — CBC WITH DIFFERENTIAL/PLATELET
Basophils Absolute: 0 10*3/uL (ref 0.0–0.1)
HCT: 45.9 % (ref 39.0–52.0)
Hemoglobin: 16 g/dL (ref 13.0–17.0)
Lymphocytes Relative: 34 % (ref 12–46)
Monocytes Absolute: 0.5 10*3/uL (ref 0.1–1.0)
Monocytes Relative: 6 % (ref 3–12)
Neutro Abs: 5.1 10*3/uL (ref 1.7–7.7)
WBC: 8.7 10*3/uL (ref 4.0–10.5)

## 2010-10-04 LAB — T-HELPER CELL (CD4) - (RCID CLINIC ONLY): CD4 % Helper T Cell: 29 % — ABNORMAL LOW (ref 33–55)

## 2010-10-04 LAB — HEPATITIS A ANTIBODY, TOTAL: Hep A Total Ab: POSITIVE — AB

## 2010-10-05 LAB — HIV-1 RNA ULTRAQUANT REFLEX TO GENTYP+
HIV 1 RNA Quant: <20 copies/mL (ref <20)
HIV-1 RNA Quant, Log: <1.3 {Log} (ref <1.30)

## 2010-10-20 ENCOUNTER — Ambulatory Visit (INDEPENDENT_AMBULATORY_CARE_PROVIDER_SITE_OTHER): Payer: Medicare Other | Admitting: Internal Medicine

## 2010-10-20 ENCOUNTER — Encounter: Payer: Self-pay | Admitting: Internal Medicine

## 2010-10-20 VITALS — BP 107/65 | HR 69 | Temp 98.1°F | Ht 72.0 in | Wt 174.0 lb

## 2010-10-20 DIAGNOSIS — B20 Human immunodeficiency virus [HIV] disease: Secondary | ICD-10-CM

## 2010-10-20 MED ORDER — GLUCERNA PO LIQD: 237.0000 mL | Freq: Every day | ORAL | Status: DC

## 2010-10-20 NOTE — Progress Notes (Signed)
  Subjective:    Patient ID: Nicholas Whitehead, male    DOB: 1955/12/23, 55 y.o.   MRN: [id number]  HPI He comes in for followup on his regimen of Atripla. Recent denies ever since with Zerit and Epivir and tolerated those well however he was changed by my recommendation to Atripla. He states that he continues to tolerate well and has not noted any rashes. Since he had been on Sustiva there is no significant new side effects that he is noted. He is pleased with the medications. His viral load continues to be undetectable.   Review of Systems  Constitutional: Negative for fever, activity change and unexpected weight change.  Respiratory: Negative for chest tightness, shortness of breath and wheezing.   Cardiovascular: Negative for chest pain and palpitations.  Gastrointestinal: Negative for nausea, diarrhea, constipation and abdominal distention.  Musculoskeletal: Negative for myalgias and arthralgias.  Skin: Negative for rash.  Neurological: Negative for headaches.  Psychiatric/Behavioral: Negative for dysphoric mood.       Objective:   Physical Exam  Constitutional: He is oriented to person, place, and time. He appears well-developed and well-nourished.  HENT:  Mouth/Throat: Oropharynx is clear and moist. No oropharyngeal exudate.  Eyes: Right eye exhibits discharge. Left eye exhibits no discharge. No scleral icterus.  Cardiovascular: Normal rate, regular rhythm and normal heart sounds.   No murmur heard. Pulmonary/Chest: Effort normal and breath sounds normal. He has no rales.  Abdominal: Soft. Bowel sounds are normal. There is no tenderness.  Lymphadenopathy:    He has no cervical adenopathy.  Neurological: He is alert and oriented to person, place, and time.  Skin: Skin is warm and dry. No rash noted. No erythema.  Psychiatric: He has a normal mood and affect. His behavior is normal.          Assessment & Plan:

## 2010-10-24 LAB — CULTURE, ROUTINE-ABSCESS

## 2010-11-28 ENCOUNTER — Other Ambulatory Visit: Payer: Self-pay | Admitting: *Deleted

## 2010-11-28 NOTE — Telephone Encounter (Signed)
rec'd refill request for a med that was d/c'd 8/12. Sent it back to pharmacy with notationthat it was d/c'd

## 2011-04-03 ENCOUNTER — Other Ambulatory Visit: Payer: Medicare Other

## 2011-04-04 ENCOUNTER — Other Ambulatory Visit: Payer: Medicare Other

## 2011-04-04 DIAGNOSIS — B20 Human immunodeficiency virus [HIV] disease: Secondary | ICD-10-CM

## 2011-04-04 LAB — CBC WITH DIFFERENTIAL/PLATELET
HCT: 46.3 % (ref 39.0–52.0)
Hemoglobin: 15.9 g/dL (ref 13.0–17.0)
Lymphs Abs: 2.9 10*3/uL (ref 0.7–4.0)
Monocytes Relative: 7 % (ref 3–12)
Neutro Abs: 4.2 10*3/uL (ref 1.7–7.7)
Neutrophils Relative %: 55 % (ref 43–77)
RBC: 4.89 MIL/uL (ref 4.22–5.81)

## 2011-04-04 LAB — COMPLETE METABOLIC PANEL WITH GFR
ALT: 27 U/L (ref 0–53)
Alkaline Phosphatase: 58 U/L (ref 39–117)
CO2: 29 mEq/L (ref 19–32)
Creat: 0.78 mg/dL (ref 0.50–1.35)
GFR, Est African American: >89 mL/min
GFR, Est Non African American: >89 mL/min
Total Bilirubin: 0.3 mg/dL (ref 0.3–1.2)

## 2011-04-05 LAB — T-HELPER CELL (CD4) - (RCID CLINIC ONLY): CD4 % Helper T Cell: 28 % — ABNORMAL LOW (ref 33–55)

## 2011-04-05 LAB — HIV-1 RNA QUANT-NO REFLEX-BLD: HIV-1 RNA Quant, Log: <1.3 {Log} (ref <1.30)

## 2011-04-17 ENCOUNTER — Other Ambulatory Visit: Payer: Self-pay | Admitting: *Deleted

## 2011-04-17 ENCOUNTER — Ambulatory Visit (INDEPENDENT_AMBULATORY_CARE_PROVIDER_SITE_OTHER): Payer: Medicare Other | Admitting: Internal Medicine

## 2011-04-17 ENCOUNTER — Encounter: Payer: Self-pay | Admitting: Internal Medicine

## 2011-04-17 VITALS — BP 112/74 | HR 80 | Temp 98.6°F | Ht 72.5 in | Wt 172.0 lb

## 2011-04-17 DIAGNOSIS — B20 Human immunodeficiency virus [HIV] disease: Secondary | ICD-10-CM

## 2011-04-17 DIAGNOSIS — F329 Major depressive disorder, single episode, unspecified: Secondary | ICD-10-CM

## 2011-04-17 DIAGNOSIS — Z113 Encounter for screening for infections with a predominantly sexual mode of transmission: Secondary | ICD-10-CM

## 2011-04-17 MED ORDER — GLUCERNA PO LIQD: 237.0000 mL | Freq: Every day | ORAL | Status: DC

## 2011-04-17 NOTE — Patient Instructions (Signed)
Increase exercise when possible

## 2011-04-18 ENCOUNTER — Encounter: Payer: Self-pay | Admitting: Internal Medicine

## 2011-04-18 DIAGNOSIS — F419 Anxiety disorder, unspecified: Secondary | ICD-10-CM | POA: Insufficient documentation

## 2011-04-18 DIAGNOSIS — F32A Depression, unspecified: Secondary | ICD-10-CM | POA: Insufficient documentation

## 2011-04-18 DIAGNOSIS — F329 Major depressive disorder, single episode, unspecified: Secondary | ICD-10-CM | POA: Insufficient documentation

## 2011-04-18 NOTE — Assessment & Plan Note (Signed)
Doing well, will continue.  Follow up in 6 months.  He did receive condoms and was reminded to use them with all sexual activity.  He is follow by a PCP who manages non HIV issues.

## 2011-04-18 NOTE — Assessment & Plan Note (Signed)
Feels occasionally.  Not interested in psychotherapy or pharmacotherapy at this time.  No SI.

## 2011-04-18 NOTE — Progress Notes (Signed)
  Subjective:    Patient ID: Nicholas Whitehead, male    DOB: 12-17-1955, 56 y.o.   MRN: [id number]  HPI Here for follow up of 042.  He continues to take Atripla, changed last year from Combivir, and no new issues.  His peripheral neuropathy persists but no worse. No recent hospitalizations.  Continues to deal with occasional depression but feels he is improving.  Continues to take his medicine with 100% compliance.     Review of Systems  Constitutional: Negative for fever, chills, fatigue and unexpected weight change.  HENT: Negative for sore throat and trouble swallowing.   Respiratory: Negative for cough and shortness of breath.   Cardiovascular: Negative for chest pain, palpitations and leg swelling.  Gastrointestinal: Negative for nausea, abdominal pain and diarrhea.  Genitourinary: Negative for discharge.  Musculoskeletal: Negative for myalgias, joint swelling and arthralgias.  Skin: Negative for pallor and rash.  Neurological: Negative for weakness, light-headedness and headaches.  Hematological: Negative for adenopathy.  Psychiatric/Behavioral: Positive for dysphoric mood. The patient is not nervous/anxious.        Objective:   Physical Exam  Constitutional: He appears well-developed and well-nourished. No distress.  HENT:  Mouth/Throat: Oropharynx is clear and moist. No oropharyngeal exudate.  Cardiovascular: Normal rate, regular rhythm and normal heart sounds.  Exam reveals no gallop and no friction rub.   No murmur heard. Pulmonary/Chest: Effort normal and breath sounds normal. No respiratory distress. He has no wheezes. He has no rales.  Abdominal: Soft. Bowel sounds are normal. He exhibits no distension. There is no tenderness. There is no rebound.  Lymphadenopathy:    He has no cervical adenopathy.  Skin: Skin is warm and dry. No rash noted. No erythema.          Assessment & Plan:

## 2011-08-03 ENCOUNTER — Other Ambulatory Visit: Payer: Self-pay | Admitting: Licensed Clinical Social Worker

## 2011-08-03 DIAGNOSIS — B20 Human immunodeficiency virus [HIV] disease: Secondary | ICD-10-CM

## 2011-08-03 MED ORDER — EFAVIRENZ-EMTRICITAB-TENOFOVIR 600-200-300 MG PO TABS: 1.0000 | ORAL_TABLET | Freq: Every day | ORAL | Status: DC

## 2011-09-27 ENCOUNTER — Encounter: Payer: Self-pay | Admitting: *Deleted

## 2011-10-12 ENCOUNTER — Other Ambulatory Visit: Payer: Medicare Other

## 2011-10-12 ENCOUNTER — Other Ambulatory Visit (HOSPITAL_COMMUNITY)
Admission: RE | Admit: 2011-10-12 | Discharge: 2011-10-12 | Disposition: A | Discharge status: Home / Self Care | Payer: Medicare Other | Source: Ambulatory Visit | Attending: Infectious Diseases | Admitting: Infectious Diseases

## 2011-10-12 DIAGNOSIS — Z113 Encounter for screening for infections with a predominantly sexual mode of transmission: Secondary | ICD-10-CM | POA: Insufficient documentation

## 2011-10-12 DIAGNOSIS — B20 Human immunodeficiency virus [HIV] disease: Secondary | ICD-10-CM

## 2011-10-12 LAB — COMPREHENSIVE METABOLIC PANEL
ALT: 25 U/L (ref 0–53)
Alkaline Phosphatase: 60 U/L (ref 39–117)
CO2: 26 mEq/L (ref 19–32)
Creat: 0.75 mg/dL (ref 0.50–1.35)
Sodium: 135 mEq/L (ref 135–145)
Total Bilirubin: 0.3 mg/dL (ref 0.3–1.2)
Total Protein: 7.5 g/dL (ref 6.0–8.3)

## 2011-10-12 LAB — CBC WITH DIFFERENTIAL/PLATELET
Basophils Relative: 0 % (ref 0–1)
Eosinophils Absolute: 0.1 10*3/uL (ref 0.0–0.7)
Eosinophils Relative: 1 % (ref 0–5)
Lymphs Abs: 2.4 10*3/uL (ref 0.7–4.0)
MCH: 32.1 pg (ref 26.0–34.0)
MCHC: 34.5 g/dL (ref 30.0–36.0)
MCV: 93.1 fL (ref 78.0–100.0)
Neutrophils Relative %: 59 % (ref 43–77)
Platelets: 147 10*3/uL — ABNORMAL LOW (ref 150–400)
RBC: 4.64 MIL/uL (ref 4.22–5.81)
RDW: 14.3 % (ref 11.5–15.5)

## 2011-10-12 LAB — RPR

## 2011-10-13 LAB — T-HELPER CELL (CD4) - (RCID CLINIC ONLY): CD4 T Cell Abs: 770 uL (ref 400–2700)

## 2011-10-13 LAB — HIV-1 RNA QUANT-NO REFLEX-BLD: HIV 1 RNA Quant: <20 copies/mL (ref <20)

## 2011-10-26 ENCOUNTER — Ambulatory Visit (INDEPENDENT_AMBULATORY_CARE_PROVIDER_SITE_OTHER): Payer: Medicare Other | Admitting: Internal Medicine

## 2011-10-26 ENCOUNTER — Encounter: Payer: Self-pay | Admitting: Internal Medicine

## 2011-10-26 VITALS — BP 114/73 | HR 73 | Temp 98.1°F | Ht 73.0 in | Wt 177.0 lb

## 2011-10-26 DIAGNOSIS — B20 Human immunodeficiency virus [HIV] disease: Secondary | ICD-10-CM

## 2011-10-30 NOTE — Assessment & Plan Note (Signed)
Doing well and no new issues.  Knows to use condoms with sexual activity.  Will defer lipid management to primary physician.  RTC 6 months.

## 2011-10-30 NOTE — Progress Notes (Signed)
  Subjective:    Patient ID: Nicholas Whitehead, male    DOB: 02-18-1955, 56 y.o.   MRN: [id number]  HPI Here for follow up of 042.  No complaints related to HIV or meds.  Excellent compliance and tolerance.  Peripheral neuropathy persists.  Also fell the night before and hurt left hip.  Able to walk and full ROM.  Reports good vacation in [place].     Review of Systems  Constitutional: Negative for fatigue.  Respiratory: Negative for shortness of breath.   Gastrointestinal: Negative for nausea, abdominal pain and diarrhea.  Musculoskeletal:       Hurt hip  Neurological: Negative for dizziness and headaches.  Psychiatric/Behavioral: Negative for dysphoric mood. The patient is not nervous/anxious.        Objective:   Physical Exam  Constitutional: He appears well-developed and well-nourished. No distress.  HENT:  Mouth/Throat: No oropharyngeal exudate.  Cardiovascular: Normal rate, regular rhythm and normal heart sounds.  Exam reveals no gallop and no friction rub.   No murmur heard. Pulmonary/Chest: Effort normal and breath sounds normal. No respiratory distress. He has no wheezes. He has no rales.  Abdominal: Soft. Bowel sounds are normal. He exhibits no distension. There is no tenderness. There is no rebound.          Assessment & Plan:

## 2011-11-27 ENCOUNTER — Telehealth: Payer: Self-pay | Admitting: *Deleted

## 2011-11-27 DIAGNOSIS — B20 Human immunodeficiency virus [HIV] disease: Secondary | ICD-10-CM

## 2011-11-27 MED ORDER — GLUCERNA PO LIQD: 237.0000 mL | Freq: Every day | ORAL | Status: DC

## 2011-11-27 NOTE — Telephone Encounter (Signed)
Patient called and advised to fax Rx for Glucerna to [facility].

## 2011-12-13 ENCOUNTER — Other Ambulatory Visit: Payer: Self-pay | Admitting: Gastroenterology

## 2011-12-27 ENCOUNTER — Encounter (HOSPITAL_COMMUNITY): Payer: Self-pay | Admitting: Pharmacy Technician

## 2011-12-29 ENCOUNTER — Encounter (HOSPITAL_COMMUNITY): Payer: Self-pay | Admitting: *Deleted

## 2011-12-29 NOTE — Pre-Procedure Instructions (Signed)
Your procedure is scheduled WU:[id number], January 16, 2012 Report to [facility] Admitting at:1100 Call this number if you have problems morning of your [phone]  Follow all bowel prep instructions per your doctor's orders.  Do not eat or drink anything after midnight the night before your procedure. You may brush your teeth, rinse out your mouth, but no water, no food, no chewing gum, no mints, no candies, no chewing tobacco.     Take these medicines the morning of your procedure with A SIP OF WATER:Atenolol and Zoloft   Please make arrangements for a responsible person to drive you home after the procedure. You cannot go home by cab/taxi. We recommend you have someone with you at home the first 24 hours after your procedure. Driver for procedure is friend Tawni Pummel [phone]  LEAVE ALL VALUABLES, JEWELRY, BILLFOLD AT HOME.  NO DENTURES, CONTACT LENSES ALLOWED IN THE ENDOSCOPY ROOM.   YOU MAY WEAR DEODORANT, PLEASE REMOVE ALL JEWELRY, WATCHES RINGS, BODY PIERCINGS AND LEAVE AT HOME.   WOMEN: NO MAKE-UP, LOTIONS PERFUMES

## 2012-01-16 ENCOUNTER — Encounter (HOSPITAL_COMMUNITY)
Admission: RE | Disposition: A | Discharge status: Home / Self Care | Payer: Self-pay | Source: Ambulatory Visit | Attending: Gastroenterology

## 2012-01-16 ENCOUNTER — Encounter (HOSPITAL_COMMUNITY): Payer: Self-pay | Admitting: *Deleted

## 2012-01-16 ENCOUNTER — Encounter (HOSPITAL_COMMUNITY): Payer: Self-pay | Admitting: Anesthesiology

## 2012-01-16 ENCOUNTER — Ambulatory Visit (HOSPITAL_COMMUNITY): Payer: Medicare Other | Admitting: Anesthesiology

## 2012-01-16 ENCOUNTER — Ambulatory Visit (HOSPITAL_COMMUNITY)
Admission: RE | Admit: 2012-01-16 | Discharge: 2012-01-16 | Disposition: A | Discharge status: Home / Self Care | Payer: Medicare Other | Source: Ambulatory Visit | Attending: Gastroenterology | Admitting: Gastroenterology

## 2012-01-16 DIAGNOSIS — D126 Benign neoplasm of colon, unspecified: Secondary | ICD-10-CM | POA: Insufficient documentation

## 2012-01-16 DIAGNOSIS — Z1211 Encounter for screening for malignant neoplasm of colon: Secondary | ICD-10-CM | POA: Insufficient documentation

## 2012-01-16 DIAGNOSIS — E7800 Pure hypercholesterolemia, unspecified: Secondary | ICD-10-CM | POA: Insufficient documentation

## 2012-01-16 DIAGNOSIS — E119 Type 2 diabetes mellitus without complications: Secondary | ICD-10-CM | POA: Insufficient documentation

## 2012-01-16 DIAGNOSIS — K5730 Diverticulosis of large intestine without perforation or abscess without bleeding: Secondary | ICD-10-CM | POA: Insufficient documentation

## 2012-01-16 DIAGNOSIS — Z794 Long term (current) use of insulin: Secondary | ICD-10-CM | POA: Insufficient documentation

## 2012-01-16 DIAGNOSIS — D214 Benign neoplasm of connective and other soft tissue of abdomen: Secondary | ICD-10-CM | POA: Insufficient documentation

## 2012-01-16 DIAGNOSIS — Z21 Asymptomatic human immunodeficiency virus [HIV] infection status: Secondary | ICD-10-CM | POA: Insufficient documentation

## 2012-01-16 DIAGNOSIS — Z79899 Other long term (current) drug therapy: Secondary | ICD-10-CM | POA: Insufficient documentation

## 2012-01-16 HISTORY — DX: Unspecified cataract: H26.9

## 2012-01-16 HISTORY — PX: COLONOSCOPY WITH PROPOFOL: SHX5780

## 2012-01-16 HISTORY — DX: Pure hypercholesterolemia, unspecified: E78.00

## 2012-01-16 HISTORY — DX: Type 2 diabetes mellitus without complications: E11.9

## 2012-01-16 HISTORY — DX: Depression, unspecified: F32.A

## 2012-01-16 HISTORY — DX: Major depressive disorder, single episode, unspecified: F32.9

## 2012-01-16 HISTORY — DX: Anxiety disorder, unspecified: F41.9

## 2012-01-16 LAB — GLUCOSE, CAPILLARY: Glucose-Capillary: 190 mg/dL — ABNORMAL HIGH (ref 70–99)

## 2012-01-16 SURGERY — COLONOSCOPY WITH PROPOFOL
Anesthesia: Monitor Anesthesia Care

## 2012-01-16 MED ORDER — LACTATED RINGERS IV SOLN
INTRAVENOUS | Status: DC
  Administered 2012-01-16: 12:00:00 via INTRAVENOUS

## 2012-01-16 MED ORDER — SODIUM CHLORIDE 0.9 % IV SOLN: INTRAVENOUS | Status: DC

## 2012-01-16 MED ORDER — PROPOFOL 10 MG/ML IV EMUL
INTRAVENOUS | Status: DC | PRN
  Administered 2012-01-16: 120 ug/kg/min via INTRAVENOUS

## 2012-01-16 MED ORDER — FENTANYL CITRATE 0.05 MG/ML IJ SOLN
INTRAMUSCULAR | Status: DC | PRN
  Administered 2012-01-16 (×2): 50 ug via INTRAVENOUS

## 2012-01-16 MED ORDER — MIDAZOLAM HCL 5 MG/5ML IJ SOLN
INTRAMUSCULAR | Status: DC | PRN
  Administered 2012-01-16: 2 mg via INTRAVENOUS

## 2012-01-16 SURGICAL SUPPLY — 22 items

## 2012-01-16 NOTE — Anesthesia Preprocedure Evaluation (Signed)
Anesthesia Evaluation  Patient identified by MRN, date of birth, ID band Patient awake    Reviewed: Allergy & Precautions, H&P , NPO status , Patient's Chart, lab work & pertinent test results  Airway Mallampati: II TM Distance: <3 FB Neck ROM: Full    Dental No notable dental hx.    Pulmonary Current Smoker,  breath sounds clear to auscultation  Pulmonary exam normal       Cardiovascular hypertension, Pt. on medications Rhythm:Regular Rate:Normal     Neuro/Psych negative neurological ROS  negative psych ROS   GI/Hepatic negative GI ROS, Neg liver ROS,   Endo/Other  diabetes, Insulin Dependent  Renal/GU negative Renal ROS  negative genitourinary   Musculoskeletal negative musculoskeletal ROS (+)   Abdominal   Peds negative pediatric ROS (+)  Hematology  (+) HIV,   Anesthesia Other Findings   Reproductive/Obstetrics negative OB ROS                           Anesthesia Physical Anesthesia Plan  ASA: III  Anesthesia Plan: MAC   Post-op Pain Management:    Induction: Intravenous  Airway Management Planned: Nasal Cannula  Additional Equipment:   Intra-op Plan:   Post-operative Plan:   Informed Consent: I have reviewed the patients History and Physical, chart, labs and discussed the procedure including the risks, benefits and alternatives for the proposed anesthesia with the patient or authorized representative who has indicated his/her understanding and acceptance.     Plan Discussed with: CRNA and Surgeon  Anesthesia Plan Comments:         Anesthesia Quick Evaluation

## 2012-01-16 NOTE — H&P (Signed)
  Problem: Screening colonoscopy  History: The patient is a 57 year old male born 1955/08/29 who is scheduled to undergo his first screening colonoscopy with polypectomy to prevent colon cancer.  Past medical history: HIV. Hypertension. Type 2 diabetes mellitus. Hypercholesterolemia. Depression. Cataracts. Neuropathy. Cataract surgery.  Medication allergies: Glipizide causes nausea.  Chronic medications: Colace. Pravachol. Atripla. Cirtirizine. Insulin. Trazodone. Zoloft. Atenolol. Lotensin. Hydrochlorothiazide. Klonopin.  Exam: The patient is alert and lying comfortably on the endoscopy stretcher. Cardiac exam reveals a regular rhythm. Lungs are clear to auscultation. Abdomen is soft, flat, and nontender to palpation in all quadrants.  Plan: Proceed with screening colonoscopy with polypectomy to prevent colon cancer.

## 2012-01-16 NOTE — Transfer of Care (Signed)
Immediate Anesthesia Transfer of Care Note  Patient: Nicholas Whitehead  Procedure(s) Performed: Procedure(s) (LRB) with comments: COLONOSCOPY WITH PROPOFOL (N/A)  Patient Location: PACU  Anesthesia Type:MAC  Level of Consciousness: awake, alert  and oriented  Airway & Oxygen Therapy: Patient Spontanous Breathing and Patient connected to face mask oxygen  Post-op Assessment: Report given to PACU RN and Post -op Vital signs reviewed and stable  Post vital signs: Reviewed and stable  Complications: No apparent anesthesia complications

## 2012-01-16 NOTE — Op Note (Signed)
Procedure: Screening colonoscopy  Endoscopist: Danise Edge  Premedication: Propofol administered by anesthesia  Procedure: The patient was placed in the left lateral decubitus position. Anal inspection and digital rectal exam were normal. The Pentax pediatric colonoscope was introduced into the rectum and advanced to the cecum. A normal-appearing ileocecal valve and appendiceal orifice were identified. Colonic preparation for the exam today was suboptimal to screen for small colon polyps.  Rectum. A 5 mm pedunculated polyp was removed with the cold snare and submitted for pathological interpretation. Retroflexed view of the distal rectum was normal.  Sigmoid colon and descending colon. Left colonic diverticulosis. From the mid sigmoid colon at 40 cm from the anal verge a 10 mm pedunculated polyp was removed with the electrocautery snare.  Splenic flexure. Normal.  Transverse colon. Normal.  Hepatic flexure. Normal.  Ascending colon. Normal.  Cecum and ileocecal valve. Normal.  Assessment: #1. Suboptimal colon prep to screen for small colon polyps #2. Left colonic diverticulosis #3. A small polyp was removed from the rectum and a 10 mm pedunculated polyp was removed from the sigmoid colon with the electrocautery snare  Recommendations: Await colon polyp pathology.

## 2012-01-16 NOTE — Anesthesia Postprocedure Evaluation (Signed)
  Anesthesia Post-op Note  Patient: Nicholas Whitehead  Procedure(s) Performed: Procedure(s) (LRB): COLONOSCOPY WITH PROPOFOL (N/A)  Patient Location: PACU  Anesthesia Type: MAC  Level of Consciousness: awake and alert   Airway and Oxygen Therapy: Patient Spontanous Breathing  Post-op Pain: mild  Post-op Assessment: Post-op Vital signs reviewed, Patient's Cardiovascular Status Stable, Respiratory Function Stable, Patent Airway and No signs of Nausea or vomiting  Last Vitals:  Filed Vitals:   01/16/12 1151  BP: 159/89  Pulse: 51  Temp: 36.5 C  Resp: 20    Post-op Vital Signs: stable   Complications: No apparent anesthesia complications

## 2012-01-17 ENCOUNTER — Encounter (HOSPITAL_COMMUNITY): Payer: Self-pay | Admitting: Gastroenterology

## 2012-04-16 ENCOUNTER — Other Ambulatory Visit: Payer: Self-pay | Admitting: Infectious Diseases

## 2012-04-16 ENCOUNTER — Other Ambulatory Visit (INDEPENDENT_AMBULATORY_CARE_PROVIDER_SITE_OTHER): Payer: Medicare HMO

## 2012-04-16 DIAGNOSIS — B20 Human immunodeficiency virus [HIV] disease: Secondary | ICD-10-CM

## 2012-04-17 LAB — HIV-1 RNA QUANT-NO REFLEX-BLD
HIV 1 RNA Quant: 108 copies/mL — ABNORMAL HIGH (ref <20)
HIV-1 RNA Quant, Log: 2.03 {Log} — ABNORMAL HIGH (ref <1.30)

## 2012-04-17 LAB — T-HELPER CELL (CD4) - (RCID CLINIC ONLY): CD4 % Helper T Cell: 26 % — ABNORMAL LOW (ref 33–55)

## 2012-04-24 LAB — HM COLONOSCOPY

## 2012-04-30 ENCOUNTER — Ambulatory Visit (INDEPENDENT_AMBULATORY_CARE_PROVIDER_SITE_OTHER): Payer: Medicare HMO | Admitting: Internal Medicine

## 2012-04-30 ENCOUNTER — Encounter: Payer: Self-pay | Admitting: Internal Medicine

## 2012-04-30 VITALS — BP 107/71 | HR 69 | Temp 98.6°F | Ht 73.0 in | Wt 181.0 lb

## 2012-04-30 DIAGNOSIS — B20 Human immunodeficiency virus [HIV] disease: Secondary | ICD-10-CM

## 2012-04-30 DIAGNOSIS — Z113 Encounter for screening for infections with a predominantly sexual mode of transmission: Secondary | ICD-10-CM

## 2012-04-30 NOTE — Assessment & Plan Note (Signed)
He is doing well though did have a detectable viral load of 108. I'm going to have him repeat the viral load in about one month otherwise he can return in 6 months for his routine followup. He will be called with this result and particularly if there is any concern based on the repeat viral load.

## 2012-04-30 NOTE — Progress Notes (Signed)
  Subjective:    Patient ID: Nicholas Whitehead, male    DOB: Jan 08, 1956, 57 y.o.   MRN: [id number]  HPI He comes in for follow up of his HIV.  He continues on Atripla and denies any missed doses. He has had no issues since his last visit. He did get a colonoscopy which showed 2 benign polyps. He does continue to follow his primary care physician. He does exercise and does not smoke. Today he has no complaints the   Review of Systems  Constitutional: Negative for fever, chills and fatigue.  HENT: Negative for sore throat and trouble swallowing.   Respiratory: Negative for cough and shortness of breath.   Cardiovascular: Negative for chest pain, palpitations and leg swelling.  Gastrointestinal: Negative for nausea, abdominal pain and diarrhea.  Musculoskeletal: Negative for myalgias, joint swelling and arthralgias.  Skin: Negative for rash.  Neurological: Negative for dizziness and headaches.       Objective:   Physical Exam  Constitutional: He appears well-developed and well-nourished. No distress.  HENT:  Mouth/Throat: Oropharynx is clear and moist. No oropharyngeal exudate.  Cardiovascular: Normal rate, regular rhythm and normal heart sounds.  Exam reveals no gallop and no friction rub.   No murmur heard. Pulmonary/Chest: Effort normal and breath sounds normal. No respiratory distress. He has no wheezes. He has no rales.  Abdominal: Soft. Bowel sounds are normal. He exhibits no distension. There is no tenderness. There is no rebound.          Assessment & Plan:

## 2012-05-28 ENCOUNTER — Telehealth: Payer: Self-pay | Admitting: *Deleted

## 2012-05-28 NOTE — Telephone Encounter (Signed)
Patient called c/o a "purple area" on his chest that he noticed. It is not painful, wasn't sure if he needed to be seen. He has a lab appt on Thursday 05/30/12. Told him one of the nurses will look at it on Thursday and if he feels he needs to be seen will  try to work in with one of the MDs. Wendall Mola CMA

## 2012-05-30 ENCOUNTER — Other Ambulatory Visit: Payer: Self-pay | Admitting: Internal Medicine

## 2012-05-30 ENCOUNTER — Other Ambulatory Visit: Payer: Medicare HMO

## 2012-05-30 ENCOUNTER — Encounter: Payer: Self-pay | Admitting: *Deleted

## 2012-05-30 DIAGNOSIS — L989 Disorder of the skin and subcutaneous tissue, unspecified: Secondary | ICD-10-CM

## 2012-05-30 DIAGNOSIS — B20 Human immunodeficiency virus [HIV] disease: Secondary | ICD-10-CM

## 2012-05-31 LAB — HIV-1 RNA QUANT-NO REFLEX-BLD: HIV 1 RNA Quant: 41 copies/mL — ABNORMAL HIGH (ref <20)

## 2012-06-28 ENCOUNTER — Other Ambulatory Visit: Payer: Self-pay | Admitting: *Deleted

## 2012-06-28 DIAGNOSIS — B20 Human immunodeficiency virus [HIV] disease: Secondary | ICD-10-CM

## 2012-06-28 MED ORDER — EFAVIRENZ-EMTRICITAB-TENOFOVIR 600-200-300 MG PO TABS: 1.0000 | ORAL_TABLET | Freq: Every day | ORAL | Status: DC

## 2012-07-08 ENCOUNTER — Telehealth: Payer: Self-pay | Admitting: *Deleted

## 2012-07-08 NOTE — Telephone Encounter (Signed)
Patient called for his viral load results from 05/30/12.  Results reported to patient.  Pt verbalized understanding, all questions answered. Andree Coss, RN

## 2012-09-13 ENCOUNTER — Other Ambulatory Visit: Payer: Self-pay | Admitting: *Deleted

## 2012-09-13 DIAGNOSIS — Z131 Encounter for screening for diabetes mellitus: Secondary | ICD-10-CM

## 2012-09-13 DIAGNOSIS — E785 Hyperlipidemia, unspecified: Secondary | ICD-10-CM

## 2012-09-16 ENCOUNTER — Other Ambulatory Visit: Payer: Medicare HMO

## 2012-09-16 ENCOUNTER — Other Ambulatory Visit: Payer: Self-pay | Admitting: *Deleted

## 2012-09-16 MED ORDER — GABAPENTIN 100 MG PO CAPS: 100.0000 mg | ORAL_CAPSULE | Freq: Three times a day (TID) | ORAL | Status: DC

## 2012-09-16 MED ORDER — PIOGLITAZONE HCL 30 MG PO TABS: 30.0000 mg | ORAL_TABLET | Freq: Every day | ORAL | Status: DC

## 2012-09-19 ENCOUNTER — Other Ambulatory Visit: Payer: Self-pay | Admitting: *Deleted

## 2012-09-19 ENCOUNTER — Encounter: Payer: Self-pay | Admitting: Infectious Disease

## 2012-09-19 ENCOUNTER — Ambulatory Visit (INDEPENDENT_AMBULATORY_CARE_PROVIDER_SITE_OTHER): Payer: Commercial Managed Care - HMO | Admitting: Infectious Disease

## 2012-09-19 ENCOUNTER — Other Ambulatory Visit (HOSPITAL_COMMUNITY)
Admission: RE | Admit: 2012-09-19 | Discharge: 2012-09-19 | Disposition: A | Discharge status: Home / Self Care | Payer: Medicare HMO | Source: Ambulatory Visit | Attending: Infectious Disease | Admitting: Infectious Disease

## 2012-09-19 VITALS — BP 136/84 | HR 119 | Temp 98.8°F | Wt 182.0 lb

## 2012-09-19 DIAGNOSIS — G629 Polyneuropathy, unspecified: Secondary | ICD-10-CM

## 2012-09-19 DIAGNOSIS — B20 Human immunodeficiency virus [HIV] disease: Secondary | ICD-10-CM

## 2012-09-19 DIAGNOSIS — Z113 Encounter for screening for infections with a predominantly sexual mode of transmission: Secondary | ICD-10-CM | POA: Insufficient documentation

## 2012-09-19 DIAGNOSIS — I10 Essential (primary) hypertension: Secondary | ICD-10-CM

## 2012-09-19 DIAGNOSIS — G4700 Insomnia, unspecified: Secondary | ICD-10-CM

## 2012-09-19 DIAGNOSIS — E119 Type 2 diabetes mellitus without complications: Secondary | ICD-10-CM

## 2012-09-19 DIAGNOSIS — Z23 Encounter for immunization: Secondary | ICD-10-CM

## 2012-09-19 DIAGNOSIS — F329 Major depressive disorder, single episode, unspecified: Secondary | ICD-10-CM

## 2012-09-19 DIAGNOSIS — G589 Mononeuropathy, unspecified: Secondary | ICD-10-CM

## 2012-09-19 LAB — COMPLETE METABOLIC PANEL WITH GFR
ALT: 35 U/L (ref 0–53)
CO2: 28 mEq/L (ref 19–32)
Calcium: 10.1 mg/dL (ref 8.4–10.5)
Chloride: 102 mEq/L (ref 96–112)
Creat: 0.75 mg/dL (ref 0.50–1.35)
GFR, Est African American: >89 mL/min
GFR, Est Non African American: >89 mL/min
Glucose, Bld: 109 mg/dL — ABNORMAL HIGH (ref 70–99)
Total Protein: 8 g/dL (ref 6.0–8.3)

## 2012-09-19 LAB — CBC WITH DIFFERENTIAL/PLATELET
Basophils Relative: 0 % (ref 0–1)
Eosinophils Absolute: 0.1 10*3/uL (ref 0.0–0.7)
MCH: 33.6 pg (ref 26.0–34.0)
MCHC: 34.9 g/dL (ref 30.0–36.0)
Neutro Abs: 6.2 10*3/uL (ref 1.7–7.7)
Neutrophils Relative %: 63 % (ref 43–77)
Platelets: 162 10*3/uL (ref 150–400)
RBC: 4.53 MIL/uL (ref 4.22–5.81)

## 2012-09-19 MED ORDER — EMTRICITAB-RILPIVIR-TENOFOV DF 200-25-300 MG PO TABS: 1.0000 | ORAL_TABLET | Freq: Every day | ORAL | Status: DC

## 2012-09-19 MED ORDER — PRAVASTATIN SODIUM 40 MG PO TABS: 40.0000 mg | ORAL_TABLET | Freq: Every evening | ORAL | Status: DC

## 2012-09-19 MED ORDER — CLONAZEPAM 1 MG PO TABS: 1.0000 mg | ORAL_TABLET | ORAL | Status: DC

## 2012-09-19 MED ORDER — TRAZODONE HCL 100 MG PO TABS: 200.0000 mg | ORAL_TABLET | Freq: Every day | ORAL | Status: DC

## 2012-09-19 NOTE — Progress Notes (Signed)
Subjective:    Patient ID: Nicholas Whitehead, male    DOB: [birth year], 57 y.o.   MRN: [id number]  HPI  57 year old African American man long standing HIV controlled on Atripla but with morbid diabetes mellitus hypertension hyperlipidemia, neuropathy and depression. Patient is currently suffering from fairly severe depressive symptoms. He has had several major life stressors. The most recent of these has been the fact that he has been dismissed from [facility] his failure to meet their payment schedule requirements. He also been followed by endocrinology at that practice. Secondly he has had his sister move in with him because she has had disabling stroke and is unable to move walk up stairs. This interpersonal relation is also been spectrum on the patient. His depression is been significant enough that recently when his friend took him on a cruise to the [place] he did not enjoy any of it despite the fact that he visited several very [place] locations.  He denies passive or active suicidal ideation but is afraid that he is going to "lose control of his emotions. He is currently on trazodone at 200 mg at bedtime as well as clonazepam twice daily. He is still having trouble sleeping. I proposed increasing his nighttime dose of clonazepam and will refill his trazodone. I am also happy to refill his chronic other medications until he is reestablished with a new primary care physician.  Does he has had substantial depressive symptoms I feel very strongly he should come off of Atripla and I've made a decision with the patient to change him to complera with food.    Review of Systems  Constitutional: Negative for fever, chills, diaphoresis, activity change, appetite change, fatigue and unexpected weight change.  HENT: Negative for congestion, sore throat, rhinorrhea, sneezing, trouble swallowing and sinus pressure.   Eyes: Negative for photophobia and visual disturbance.  Respiratory:  Negative for cough, chest tightness, shortness of breath, wheezing and stridor.   Cardiovascular: Negative for chest pain, palpitations and leg swelling.  Gastrointestinal: Negative for nausea, vomiting, abdominal pain, diarrhea, constipation, blood in stool, abdominal distention and anal bleeding.  Genitourinary: Negative for dysuria, hematuria, flank pain and difficulty urinating.  Musculoskeletal: Negative for myalgias, back pain, joint swelling, arthralgias and gait problem.  Skin: Negative for color change, pallor, rash and wound.  Neurological: Negative for dizziness, tremors, weakness and light-headedness.  Hematological: Negative for adenopathy. Does not bruise/bleed easily.  Psychiatric/Behavioral: Positive for sleep disturbance, dysphoric mood and decreased concentration. Negative for suicidal ideas, hallucinations, behavioral problems, confusion, self-injury and agitation. The patient is nervous/anxious. The patient is not hyperactive.        Objective:   Physical Exam  Constitutional: He is oriented to person, place, and time. He appears well-nourished. No distress.  Lipodystrophy changes in the face  HENT:  Head: Normocephalic and atraumatic.  Mouth/Throat: Oropharynx is clear and moist. No oropharyngeal exudate.  Eyes: Conjunctivae and EOM are normal. Pupils are equal, round, and reactive to light. No scleral icterus.  Neck: Normal range of motion. Neck supple. No JVD present.  Cardiovascular: Normal rate, regular rhythm and normal heart sounds.  Exam reveals no gallop and no friction rub.   No murmur heard. Pulmonary/Chest: Effort normal and breath sounds normal. No respiratory distress. He has no wheezes. He has no rales. He exhibits no tenderness.  Abdominal: He exhibits no distension and no mass. There is no tenderness. There is no rebound and no guarding.  Musculoskeletal: He exhibits no edema and no  tenderness.  Lymphadenopathy:    He has no cervical adenopathy.   Neurological: He is alert and oriented to person, place, and time. He has normal reflexes. He exhibits normal muscle tone. Coordination normal.  Skin: Skin is warm and dry. He is not diaphoretic. No erythema. No pallor.  Psychiatric: His behavior is normal. Judgment and thought content normal. He exhibits a depressed mood.          Assessment & Plan:  HIV: change to Complera, check labs today and in 1-2 months  Depression with insomnia and anxiety: Refill his trazodone at 200 mg at bedtime increase clonazepam to 1 mg in the morning and 2 mg at bedtime. I spent greater than 45 minutes with the patient including greater than 50% of time in face to face counsel of the patient and in coordination of their care.   Diabetes mellitus we can refill his Lantus if needed and other diabetic medications.  Hypertension also had to refill these meds as needed.

## 2012-09-20 LAB — T-HELPER CELL (CD4) - (RCID CLINIC ONLY)
CD4 % Helper T Cell: 31 % — ABNORMAL LOW (ref 33–55)
CD4 T Cell Abs: 910 /uL (ref 400–2700)

## 2012-09-20 LAB — RPR

## 2012-09-20 LAB — HIV-1 RNA ULTRAQUANT REFLEX TO GENTYP+: HIV-1 RNA Quant, Log: <1.3 {Log} (ref <1.30)

## 2012-09-23 ENCOUNTER — Ambulatory Visit: Payer: Medicare HMO | Admitting: Endocrinology

## 2012-09-25 LAB — HLA B*5701: HLA-B*5701: NEGATIVE

## 2012-09-26 ENCOUNTER — Telehealth: Payer: Self-pay | Admitting: *Deleted

## 2012-09-26 NOTE — Telephone Encounter (Signed)
HLA-B *5701 genetic testing prior to considering change to abacavir-based medication.  Paperwork given to Dr. Daiva Eves for completion.

## 2012-10-17 ENCOUNTER — Other Ambulatory Visit: Payer: Commercial Managed Care - HMO

## 2012-10-17 DIAGNOSIS — B20 Human immunodeficiency virus [HIV] disease: Secondary | ICD-10-CM

## 2012-10-17 LAB — COMPLETE METABOLIC PANEL WITH GFR
ALT: 37 U/L (ref 0–53)
Alkaline Phosphatase: 47 U/L (ref 39–117)
CO2: 27 mEq/L (ref 19–32)
Creat: 0.86 mg/dL (ref 0.50–1.35)
GFR, Est African American: >89 mL/min
GFR, Est Non African American: >89 mL/min
Sodium: 134 mEq/L — ABNORMAL LOW (ref 135–145)
Total Bilirubin: 0.3 mg/dL (ref 0.3–1.2)
Total Protein: 7.2 g/dL (ref 6.0–8.3)

## 2012-10-17 LAB — CBC WITH DIFFERENTIAL/PLATELET
Eosinophils Absolute: 0.1 10*3/uL (ref 0.0–0.7)
Hemoglobin: 14.4 g/dL (ref 13.0–17.0)
Lymphocytes Relative: 25 % (ref 12–46)
Lymphs Abs: 1.9 10*3/uL (ref 0.7–4.0)
MCH: 32.7 pg (ref 26.0–34.0)
Monocytes Relative: 10 % (ref 3–12)
Neutrophils Relative %: 63 % (ref 43–77)
Platelets: 160 10*3/uL (ref 150–400)
RBC: 4.4 MIL/uL (ref 4.22–5.81)
WBC: 7.6 10*3/uL (ref 4.0–10.5)

## 2012-10-18 LAB — T-HELPER CELL (CD4) - (RCID CLINIC ONLY)
CD4 % Helper T Cell: 32 % — ABNORMAL LOW (ref 33–55)
CD4 T Cell Abs: 680 /uL (ref 400–2700)

## 2012-10-18 LAB — HIV-1 RNA QUANT-NO REFLEX-BLD: HIV-1 RNA Quant, Log: <1.3 {Log} (ref <1.30)

## 2012-10-28 ENCOUNTER — Other Ambulatory Visit: Payer: Self-pay | Admitting: *Deleted

## 2012-10-28 MED ORDER — BENAZEPRIL-HYDROCHLOROTHIAZIDE 20-25 MG PO TABS: 1.0000 | ORAL_TABLET | Freq: Every day | ORAL | Status: DC

## 2012-10-31 ENCOUNTER — Ambulatory Visit (INDEPENDENT_AMBULATORY_CARE_PROVIDER_SITE_OTHER): Payer: Commercial Managed Care - HMO | Admitting: Internal Medicine

## 2012-10-31 ENCOUNTER — Encounter: Payer: Self-pay | Admitting: Internal Medicine

## 2012-10-31 VITALS — BP 130/72 | HR 62 | Temp 98.3°F | Ht 73.0 in | Wt 189.0 lb

## 2012-10-31 DIAGNOSIS — I10 Essential (primary) hypertension: Secondary | ICD-10-CM

## 2012-10-31 DIAGNOSIS — F329 Major depressive disorder, single episode, unspecified: Secondary | ICD-10-CM

## 2012-10-31 DIAGNOSIS — B20 Human immunodeficiency virus [HIV] disease: Secondary | ICD-10-CM

## 2012-10-31 MED ORDER — PSEUDOEPHEDRINE HCL 30 MG PO TABS: 30.0000 mg | ORAL_TABLET | ORAL | Status: DC | PRN

## 2012-10-31 MED ORDER — GABAPENTIN 100 MG PO CAPS: 100.0000 mg | ORAL_CAPSULE | Freq: Three times a day (TID) | ORAL | Status: DC

## 2012-10-31 NOTE — Assessment & Plan Note (Signed)
He has good blood pressure control today. He will continue with his current medications. He also will look for a new primary physician.  He was let go by his previous practice at [place] to 2 not paying his bills

## 2012-10-31 NOTE — Assessment & Plan Note (Signed)
He is doing well with his Complera and will continue. He will return in 4 months.

## 2012-10-31 NOTE — Assessment & Plan Note (Signed)
He was scheduled for counseling

## 2012-10-31 NOTE — Progress Notes (Signed)
  Subjective:    Patient ID: Nicholas Whitehead, male    DOB: 09/24/1955, 57 y.o.   MRN: [id number]  HPI He comes in for followup of his HIV. He was seen last month and with ongoing depression his Atripla was changed to Complera.  He reports excellent compliance in taking medication with a high fatty meal. He also continues to suffer from depression though has noted some improvement since stopping Sustiva.  He has not yet seen a Veterinary surgeon. He does continue to take his medication including higher dose of the benzodiazepine. He is requesting refills of his Neurontin. He has had some upper airway congestion, no fever   Review of Systems  Constitutional: Positive for fatigue. Negative for fever, chills and unexpected weight change.  HENT: Positive for congestion. Negative for sore throat.   Eyes: Negative for visual disturbance.  Respiratory: Positive for cough. Negative for shortness of breath.   Cardiovascular: Negative for leg swelling.  Gastrointestinal: Negative for nausea.  Musculoskeletal: Negative for back pain.  Neurological: Negative for dizziness, light-headedness and headaches.  Hematological: Negative for adenopathy.  Psychiatric/Behavioral: Positive for sleep disturbance and dysphoric mood. Negative for suicidal ideas.       Objective:   Physical Exam  Constitutional: He appears well-developed and well-nourished. No distress.  HENT:  Mouth/Throat: No oropharyngeal exudate.  Eyes: No scleral icterus.  Cardiovascular: Normal rate, regular rhythm and normal heart sounds.   No murmur heard. Pulmonary/Chest: Effort normal and breath sounds normal. No respiratory distress.  Lymphadenopathy:    He has no cervical adenopathy.  Neurological: He is alert.  Skin: No rash noted.  Psychiatric: His behavior is normal.          Assessment & Plan:

## 2012-11-05 ENCOUNTER — Ambulatory Visit: Payer: Commercial Managed Care - HMO

## 2012-11-05 DIAGNOSIS — F331 Major depressive disorder, recurrent, moderate: Secondary | ICD-10-CM

## 2012-11-05 DIAGNOSIS — F411 Generalized anxiety disorder: Secondary | ICD-10-CM

## 2012-11-05 NOTE — Progress Notes (Signed)
I met with Nicholas Whitehead today for the first time and he reports that he has no energy and is not sleeping well.  He said he gets about 3-4 hours of sleep a night and sometimes gets a nap during the day.  His sister and grand-nephew (age 55) are staying with him temporarily and this is very stressful to him.  He said "I don't like to be around children very much" and reports that this child is very "rambunctious", which is stressful to him.  He said he gets along well with his sister and that she is planning to get her own place soon.  She had a car wreck in July and has been recuperating ever since.  He reports depressed mood (3 on a 10 pt scale) with no suicidal thoughts.  He also reports anxiety every day and possibly panic attacks.  He said he is irritable at times as well.  He presents very lethargic and soft-spoken.  Plan to meet in 5 weeks due to scheduling.

## 2012-11-22 ENCOUNTER — Other Ambulatory Visit: Payer: Self-pay | Admitting: *Deleted

## 2012-11-22 MED ORDER — INSULIN ASPART 100 UNIT/ML ~~LOC~~ SOLN: 16.0000 [IU] | Freq: Three times a day (TID) | SUBCUTANEOUS | Status: DC

## 2012-12-16 ENCOUNTER — Ambulatory Visit: Payer: Commercial Managed Care - HMO

## 2012-12-16 DIAGNOSIS — F331 Major depressive disorder, recurrent, moderate: Secondary | ICD-10-CM

## 2012-12-16 DIAGNOSIS — F411 Generalized anxiety disorder: Secondary | ICD-10-CM

## 2012-12-16 NOTE — Progress Notes (Signed)
Nicholas Whitehead was soft spoken again today, reporting that he has had some issues with poor sleep and diarrhea, since starting the Complera.  He said that recently it seemed to go away and is hopeful that the worst of it is over.  I told him about melatonin and he said he may try this as a sleep aid.  He also reported racing thoughts, so I introduced him to mindfulness and facilitated a 2 minute guided meditation.  He responded very well to this and said he felt very peaceful from it.  Plan to meet in one week.

## 2012-12-23 ENCOUNTER — Ambulatory Visit: Payer: Commercial Managed Care - HMO

## 2012-12-23 DIAGNOSIS — F330 Major depressive disorder, recurrent, mild: Secondary | ICD-10-CM

## 2012-12-23 NOTE — Progress Notes (Signed)
Nicholas Whitehead talked about his past week and how he spent 3 hours at a choir rehearsal on Saturday, which took a lot out of him.  As a result, he did not go to hear the [facility] yesterday.  He asked "do you think I'm lonely?"  I suggested [facility], but he said he had tried it a couple of years ago and didn't relate to the people who went there.  I gave him contact information on [facility] [employer]'s support groups and printed out a calendar.  I also talked about volunteer work, but he said he didn't want to do that.  Due to scheduling he will return in January.

## 2012-12-24 ENCOUNTER — Other Ambulatory Visit: Payer: Self-pay | Admitting: *Deleted

## 2012-12-26 ENCOUNTER — Ambulatory Visit: Payer: Self-pay | Admitting: Podiatry

## 2013-01-03 ENCOUNTER — Other Ambulatory Visit: Payer: Self-pay | Admitting: *Deleted

## 2013-01-03 MED ORDER — PRAVASTATIN SODIUM 40 MG PO TABS: 40.0000 mg | ORAL_TABLET | Freq: Every evening | ORAL | Status: DC

## 2013-01-06 LAB — HM DIABETES EYE EXAM

## 2013-01-13 ENCOUNTER — Telehealth: Payer: Self-pay | Admitting: Podiatry

## 2013-01-13 NOTE — Telephone Encounter (Signed)
Patient called inquiring about his appointment wanting to know if we relocated.  I returned his call and informed him that his appointment is not today.  Appointment is on Thursday the 8th at 9:30am and we are at the same location on [address].

## 2013-01-15 ENCOUNTER — Telehealth: Payer: Self-pay | Admitting: *Deleted

## 2013-01-15 NOTE — Telephone Encounter (Signed)
Pt states he is changing pharmacies to [facility] and to expect refill requests from them today.  Pharmacy changed in his profile. Landis Gandy, RN

## 2013-01-16 ENCOUNTER — Ambulatory Visit (INDEPENDENT_AMBULATORY_CARE_PROVIDER_SITE_OTHER): Payer: Medicare HMO | Admitting: Podiatry

## 2013-01-16 ENCOUNTER — Encounter: Payer: Self-pay | Admitting: Podiatry

## 2013-01-16 VITALS — BP 144/83 | HR 59 | Resp 17 | Ht 73.0 in | Wt 190.0 lb

## 2013-01-16 DIAGNOSIS — M79609 Pain in unspecified limb: Secondary | ICD-10-CM

## 2013-01-16 DIAGNOSIS — B351 Tinea unguium: Secondary | ICD-10-CM

## 2013-01-16 DIAGNOSIS — E1159 Type 2 diabetes mellitus with other circulatory complications: Secondary | ICD-10-CM

## 2013-01-16 DIAGNOSIS — Q828 Other specified congenital malformations of skin: Secondary | ICD-10-CM

## 2013-01-16 NOTE — Progress Notes (Signed)
Subjective:     Patient ID: Nicholas Whitehead, male   DOB: [birth year], 58 y.o.   MRN: [id number]  HPI patient is in poor health HIV-positive with diabetes and nail disease 1-5 of both feet and fifth keratotic lesions heels of both feet that are painful   Review of Systems     Objective:   Physical Exam No change in neurovascular history with severe nail disease 1-5 both feet that are thick and keratotic plantar lesions heel both feet that are thick and painful    Assessment:     At risk diabetic with vascular disease and at risk lesions plantar feet and painful nailbeds 1-5 both feet    Plan:     Diabetic education rendered and today debridement of nailbeds 1-5 both feet accomplished along with debridement of lesions on the plantar aspect of both feet reappoint for routine care and less needs to be seen earlier

## 2013-01-16 NOTE — Progress Notes (Signed)
   Subjective:    Patient ID: Nicholas Whitehead, male    DOB: 02-01-1955, 58 y.o.   MRN: [id number]  HPI    Review of Systems  Constitutional: Positive for activity change and appetite change.  HENT: Negative.   Eyes: Negative.   Respiratory: Negative.   Cardiovascular: Negative.   Gastrointestinal: Negative.   Endocrine: Negative.   Genitourinary: Negative.   Musculoskeletal: Positive for arthralgias.       Aching in pelvic bones and legs and sometimes in the arms  Skin: Negative.   Allergic/Immunologic: Positive for immunocompromised state.       HIV positive  Neurological:       Peripheral neuropathy  Hematological:       HIV positive  Psychiatric/Behavioral: The patient is nervous/anxious.        Pt's brother has been ICU       Objective:   Physical Exam        Assessment & Plan:

## 2013-01-16 NOTE — Progress Notes (Signed)
Pt presents for debridement of callous and toenails.

## 2013-01-28 ENCOUNTER — Ambulatory Visit: Payer: Commercial Managed Care - HMO

## 2013-01-28 DIAGNOSIS — F330 Major depressive disorder, recurrent, mild: Secondary | ICD-10-CM

## 2013-01-28 NOTE — Progress Notes (Signed)
Nicholas Whitehead was in a pleasant mood today and seemed to have more energy than in past sessions.  He reported that on New Year's Eve his younger brother was taken to the hospital by EMT and when he saw him he looked like he was dying.  He said it shocked him and he thought he was losing his brother.  However, he said a few days later they released him and he looked much better.  He expressed his relief at this.  He also followed my suggestion and went to a support group at the [facility].  He said there were 4 or 5 of them there and they had a very good discussion.  He plans to return this week.  I praised him for following up on this.  He also said he said over the weekend with a gospel group he is a part of, called [employer], and they were "received very well".  Plan to meet in one week.

## 2013-02-01 ENCOUNTER — Other Ambulatory Visit: Payer: Self-pay | Admitting: Infectious Disease

## 2013-02-01 DIAGNOSIS — F411 Generalized anxiety disorder: Secondary | ICD-10-CM

## 2013-02-04 ENCOUNTER — Ambulatory Visit: Payer: Commercial Managed Care - HMO

## 2013-02-04 DIAGNOSIS — F330 Major depressive disorder, recurrent, mild: Secondary | ICD-10-CM

## 2013-02-04 NOTE — Progress Notes (Signed)
Nicholas Whitehead was pleasant again today and reported that he has returned to the [employer] a second time for a support group and it was very helpful.  He said the group members are respectful of each other and really seem to listen to each other.  He said he looks forward to it now and is glad I recommended it.  He also reports that his brother, who was recently hospitalized, is doing much better and he is relieved.  He talked about his family today and how close they are.  Plan to meet in one month.

## 2013-02-13 ENCOUNTER — Other Ambulatory Visit: Payer: Self-pay | Admitting: Licensed Clinical Social Worker

## 2013-02-13 DIAGNOSIS — E785 Hyperlipidemia, unspecified: Secondary | ICD-10-CM

## 2013-02-13 MED ORDER — PRAVASTATIN SODIUM 40 MG PO TABS: 40.0000 mg | ORAL_TABLET | Freq: Every evening | ORAL | Status: DC

## 2013-02-20 ENCOUNTER — Other Ambulatory Visit: Payer: Self-pay | Admitting: Licensed Clinical Social Worker

## 2013-02-20 DIAGNOSIS — F329 Major depressive disorder, single episode, unspecified: Secondary | ICD-10-CM

## 2013-02-20 DIAGNOSIS — F32A Depression, unspecified: Secondary | ICD-10-CM

## 2013-02-20 MED ORDER — SERTRALINE HCL 100 MG PO TABS: 100.0000 mg | ORAL_TABLET | Freq: Every day | ORAL | Status: DC

## 2013-02-24 ENCOUNTER — Other Ambulatory Visit (INDEPENDENT_AMBULATORY_CARE_PROVIDER_SITE_OTHER): Payer: Commercial Managed Care - HMO

## 2013-02-24 DIAGNOSIS — B20 Human immunodeficiency virus [HIV] disease: Secondary | ICD-10-CM

## 2013-02-24 LAB — COMPLETE METABOLIC PANEL WITH GFR
ALK PHOS: 61 U/L (ref 39–117)
ALT: 31 U/L (ref 0–53)
AST: 33 U/L (ref 0–37)
Albumin: 4.4 g/dL (ref 3.5–5.2)
BILIRUBIN TOTAL: 0.3 mg/dL (ref 0.2–1.2)
BUN: 14 mg/dL (ref 6–23)
CO2: 29 meq/L (ref 19–32)
CREATININE: 0.97 mg/dL (ref 0.50–1.35)
Calcium: 9.1 mg/dL (ref 8.4–10.5)
Chloride: 100 mEq/L (ref 96–112)
GFR, Est Non African American: 88 mL/min
Glucose, Bld: 245 mg/dL — ABNORMAL HIGH (ref 70–99)
Potassium: 3.9 mEq/L (ref 3.5–5.3)
SODIUM: 137 meq/L (ref 135–145)
TOTAL PROTEIN: 7.8 g/dL (ref 6.0–8.3)

## 2013-02-24 LAB — CBC WITH DIFFERENTIAL/PLATELET
BASOS ABS: 0 10*3/uL (ref 0.0–0.1)
BASOS PCT: 0 % (ref 0–1)
EOS ABS: 0.1 10*3/uL (ref 0.0–0.7)
EOS PCT: 1 % (ref 0–5)
HCT: 46.7 % (ref 39.0–52.0)
Hemoglobin: 15.9 g/dL (ref 13.0–17.0)
Lymphocytes Relative: 27 % (ref 12–46)
Lymphs Abs: 2.6 10*3/uL (ref 0.7–4.0)
MCH: 31.5 pg (ref 26.0–34.0)
MCHC: 34 g/dL (ref 30.0–36.0)
MCV: 92.5 fL (ref 78.0–100.0)
Monocytes Absolute: 0.6 10*3/uL (ref 0.1–1.0)
Monocytes Relative: 6 % (ref 3–12)
Neutro Abs: 6.3 10*3/uL (ref 1.7–7.7)
Neutrophils Relative %: 66 % (ref 43–77)
Platelets: 128 10*3/uL — ABNORMAL LOW (ref 150–400)
RBC: 5.05 MIL/uL (ref 4.22–5.81)
RDW: 15.1 % (ref 11.5–15.5)
WBC: 9.6 10*3/uL (ref 4.0–10.5)

## 2013-02-25 LAB — T-HELPER CELL (CD4) - (RCID CLINIC ONLY)
CD4 T CELL HELPER: 33 % (ref 33–55)
CD4 T Cell Abs: 800 /uL (ref 400–2700)

## 2013-02-26 ENCOUNTER — Other Ambulatory Visit: Payer: Self-pay | Admitting: *Deleted

## 2013-02-26 ENCOUNTER — Telehealth: Payer: Self-pay | Admitting: *Deleted

## 2013-02-26 DIAGNOSIS — G609 Hereditary and idiopathic neuropathy, unspecified: Secondary | ICD-10-CM

## 2013-02-26 LAB — HIV-1 RNA QUANT-NO REFLEX-BLD: HIV-1 RNA Quant, Log: <1.3 {Log} (ref <1.30)

## 2013-02-26 MED ORDER — GABAPENTIN 100 MG PO CAPS: 100.0000 mg | ORAL_CAPSULE | Freq: Three times a day (TID) | ORAL | Status: DC

## 2013-02-26 NOTE — Telephone Encounter (Signed)
Patient called requesting refills on his insulins and supplies and also the Atenolol. Advised him he needed to get established with a PCP and gave him the # again for [facility]. Told him I would ask Dr. Linus Salmons to fill these medications this month while he is waiting to be seen by PCP. Myrtis Hopping

## 2013-02-27 ENCOUNTER — Other Ambulatory Visit: Payer: Self-pay | Admitting: Internal Medicine

## 2013-02-27 ENCOUNTER — Other Ambulatory Visit: Payer: Self-pay | Admitting: *Deleted

## 2013-02-27 DIAGNOSIS — Z23 Encounter for immunization: Secondary | ICD-10-CM

## 2013-02-27 DIAGNOSIS — E785 Hyperlipidemia, unspecified: Secondary | ICD-10-CM

## 2013-02-27 DIAGNOSIS — G4700 Insomnia, unspecified: Secondary | ICD-10-CM

## 2013-02-27 DIAGNOSIS — B20 Human immunodeficiency virus [HIV] disease: Secondary | ICD-10-CM

## 2013-02-27 DIAGNOSIS — I10 Essential (primary) hypertension: Secondary | ICD-10-CM

## 2013-02-27 DIAGNOSIS — G629 Polyneuropathy, unspecified: Secondary | ICD-10-CM

## 2013-02-27 DIAGNOSIS — F329 Major depressive disorder, single episode, unspecified: Secondary | ICD-10-CM

## 2013-02-27 DIAGNOSIS — E119 Type 2 diabetes mellitus without complications: Secondary | ICD-10-CM

## 2013-02-27 DIAGNOSIS — F32A Depression, unspecified: Secondary | ICD-10-CM

## 2013-02-27 MED ORDER — INSULIN PEN NEEDLE 31G X 8 MM MISC: Status: DC

## 2013-02-27 MED ORDER — BENAZEPRIL-HYDROCHLOROTHIAZIDE 20-25 MG PO TABS: 1.0000 | ORAL_TABLET | Freq: Every day | ORAL | Status: DC

## 2013-02-27 MED ORDER — PRAVASTATIN SODIUM 40 MG PO TABS: 40.0000 mg | ORAL_TABLET | Freq: Every evening | ORAL | Status: DC

## 2013-02-27 MED ORDER — INSULIN ASPART 100 UNIT/ML ~~LOC~~ SOLN: 16.0000 [IU] | Freq: Three times a day (TID) | SUBCUTANEOUS | Status: DC

## 2013-02-27 MED ORDER — ATENOLOL 50 MG PO TABS: 50.0000 mg | ORAL_TABLET | Freq: Every day | ORAL | Status: DC

## 2013-02-27 MED ORDER — INSULIN GLARGINE 100 UNIT/ML ~~LOC~~ SOLN: 80.0000 [IU] | Freq: Two times a day (BID) | SUBCUTANEOUS | Status: DC

## 2013-02-27 MED ORDER — EMTRICITAB-RILPIVIR-TENOFOV DF 200-25-300 MG PO TABS: 1.0000 | ORAL_TABLET | Freq: Every day | ORAL | Status: DC

## 2013-02-27 MED ORDER — PIOGLITAZONE HCL 30 MG PO TABS: 30.0000 mg | ORAL_TABLET | Freq: Every day | ORAL | Status: DC

## 2013-02-27 NOTE — Progress Notes (Signed)
Patient notified, insulin needles also sent with message for future refills to be provided by PCP

## 2013-02-27 NOTE — Telephone Encounter (Signed)
Patient scheduled with [facility] for 03/25/13. Nicholas Whitehead

## 2013-03-03 ENCOUNTER — Other Ambulatory Visit: Payer: Medicare HMO

## 2013-03-04 ENCOUNTER — Ambulatory Visit: Payer: Medicare HMO

## 2013-03-10 ENCOUNTER — Other Ambulatory Visit: Payer: Self-pay | Admitting: *Deleted

## 2013-03-10 MED ORDER — INSULIN ASPART 100 UNIT/ML FLEXPEN: 16.0000 [IU] | PEN_INJECTOR | Freq: Three times a day (TID) | SUBCUTANEOUS | Status: DC

## 2013-03-20 ENCOUNTER — Ambulatory Visit: Payer: Medicare HMO | Admitting: Internal Medicine

## 2013-03-20 ENCOUNTER — Ambulatory Visit: Payer: Commercial Managed Care - HMO

## 2013-03-20 DIAGNOSIS — F330 Major depressive disorder, recurrent, mild: Secondary | ICD-10-CM

## 2013-03-20 NOTE — Progress Notes (Signed)
Nicholas Whitehead was very low key at the outset of the appointment, talking very low.  Then he looked up and said "how about them Redsox??"  Then he laughed when I looked at him confused, and said he was just trying to lighten the mood.  We both laughed.  He talked about his frustration with his sister and her grandson living with him.  He said he loves her (and her grandson), but he misses having his space.  He said there are things of hers stacked up in house and it is driving him crazy.  He did say she is working on finding a place to move to.  He also talked about his involvement with 2 gospel singing groups and how he enjoys it, but sometimes just gets tired of going to the rehearsals.  He also said he plans to return to the [facility] for a support group meeting.  I praised him for continuing to do this.  Plan to meet in a few weeks.

## 2013-03-23 ENCOUNTER — Emergency Department (HOSPITAL_COMMUNITY): Payer: Medicare HMO

## 2013-03-23 ENCOUNTER — Encounter (HOSPITAL_COMMUNITY): Payer: Self-pay | Admitting: Emergency Medicine

## 2013-03-23 ENCOUNTER — Emergency Department (HOSPITAL_COMMUNITY)
Admission: EM | Admit: 2013-03-23 | Discharge: 2013-03-23 | Disposition: A | Discharge status: Home / Self Care | Payer: Medicare HMO | Attending: Emergency Medicine | Admitting: Emergency Medicine

## 2013-03-23 DIAGNOSIS — I10 Essential (primary) hypertension: Secondary | ICD-10-CM | POA: Insufficient documentation

## 2013-03-23 DIAGNOSIS — S20219A Contusion of unspecified front wall of thorax, initial encounter: Secondary | ICD-10-CM

## 2013-03-23 DIAGNOSIS — Z87891 Personal history of nicotine dependence: Secondary | ICD-10-CM | POA: Insufficient documentation

## 2013-03-23 DIAGNOSIS — B20 Human immunodeficiency virus [HIV] disease: Secondary | ICD-10-CM | POA: Insufficient documentation

## 2013-03-23 DIAGNOSIS — F3289 Other specified depressive episodes: Secondary | ICD-10-CM | POA: Insufficient documentation

## 2013-03-23 DIAGNOSIS — H269 Unspecified cataract: Secondary | ICD-10-CM | POA: Insufficient documentation

## 2013-03-23 DIAGNOSIS — E7800 Pure hypercholesterolemia, unspecified: Secondary | ICD-10-CM | POA: Insufficient documentation

## 2013-03-23 DIAGNOSIS — Z794 Long term (current) use of insulin: Secondary | ICD-10-CM | POA: Insufficient documentation

## 2013-03-23 DIAGNOSIS — E785 Hyperlipidemia, unspecified: Secondary | ICD-10-CM | POA: Insufficient documentation

## 2013-03-23 DIAGNOSIS — E119 Type 2 diabetes mellitus without complications: Secondary | ICD-10-CM | POA: Insufficient documentation

## 2013-03-23 DIAGNOSIS — W010XXA Fall on same level from slipping, tripping and stumbling without subsequent striking against object, initial encounter: Secondary | ICD-10-CM | POA: Insufficient documentation

## 2013-03-23 DIAGNOSIS — IMO0002 Reserved for concepts with insufficient information to code with codable children: Secondary | ICD-10-CM | POA: Insufficient documentation

## 2013-03-23 DIAGNOSIS — F411 Generalized anxiety disorder: Secondary | ICD-10-CM | POA: Insufficient documentation

## 2013-03-23 DIAGNOSIS — Z79899 Other long term (current) drug therapy: Secondary | ICD-10-CM | POA: Insufficient documentation

## 2013-03-23 DIAGNOSIS — Y9389 Activity, other specified: Secondary | ICD-10-CM | POA: Insufficient documentation

## 2013-03-23 DIAGNOSIS — Y929 Unspecified place or not applicable: Secondary | ICD-10-CM | POA: Insufficient documentation

## 2013-03-23 DIAGNOSIS — F329 Major depressive disorder, single episode, unspecified: Secondary | ICD-10-CM | POA: Insufficient documentation

## 2013-03-23 MED ORDER — HYDROCODONE-ACETAMINOPHEN 5-325 MG PO TABS
2.0000 | ORAL_TABLET | Freq: Once | ORAL | Status: AC
  Administered 2013-03-23: 2 via ORAL
  Filled 2013-03-23: qty 2

## 2013-03-23 MED ORDER — HYDROCODONE-ACETAMINOPHEN 5-325 MG PO TABS
1.0000 | ORAL_TABLET | Freq: Four times a day (QID) | ORAL | Status: DC | PRN
Start: 2013-03-23 — End: 2013-05-21

## 2013-03-23 NOTE — ED Notes (Signed)
Bed: DY70 Expected date: 03/23/13 Expected time: 12:27 AM Means of arrival: Ambulance Comments: [facility], rib pain

## 2013-03-23 NOTE — ED Notes (Signed)
Pt assisted to the BR.  With steady gait.

## 2013-03-23 NOTE — Discharge Instructions (Signed)
Take ibuprofen as need for pain. You may also take hydrocodone as need for pain. No driving for the next 6 hours or when taking hydrocodone. Also, do not take tylenol or acetaminophen containing medication when taking hydrocodone. Also do not drink alcohol when taking the hydrocodone. Take full and deep breaths, stay active. Follow up with primary care doctor in 1 week for recheck if symptoms fail to improve/resolve. Return to ER if worse, new symptoms/pain, trouble breathing, fevers, other concern.    Fall Prevention and Home Safety Falls cause injuries and can affect all age groups. It is possible to use preventive measures to significantly decrease the likelihood of falls. There are many simple measures which can make your home safer and prevent falls. OUTDOORS  Repair cracks and edges of walkways and driveways.  Remove high doorway thresholds.  Trim shrubbery on the main path into your home.  Have good outside lighting.  Clear walkways of tools, rocks, debris, and clutter.  Check that handrails are not broken and are securely fastened. Both sides of steps should have handrails.  Have leaves, snow, and ice cleared regularly.  Use sand or salt on walkways during winter months.  In the garage, clean up grease or oil spills. BATHROOM  Install night lights.  Install grab bars by the toilet and in the tub and shower.  Use non-skid mats or decals in the tub or shower.  Place a plastic non-slip stool in the shower to sit on, if needed.  Keep floors dry and clean up all water on the floor immediately.  Remove soap buildup in the tub or shower on a regular basis.  Secure bath mats with non-slip, double-sided rug tape.  Remove throw rugs and tripping hazards from the floors. BEDROOMS  Install night lights.  Make sure a bedside light is easy to reach.  Do not use oversized bedding.  Keep a telephone by your bedside.  Have a firm chair with side arms to use for getting  dressed.  Remove throw rugs and tripping hazards from the floor. KITCHEN  Keep handles on pots and pans turned toward the center of the stove. Use back burners when possible.  Clean up spills quickly and allow time for drying.  Avoid walking on wet floors.  Avoid hot utensils and knives.  Position shelves so they are not too high or low.  Place commonly used objects within easy reach.  If necessary, use a sturdy step stool with a grab bar when reaching.  Keep electrical cables out of the way.  Do not use floor polish or wax that makes floors slippery. If you must use wax, use non-skid floor wax.  Remove throw rugs and tripping hazards from the floor. STAIRWAYS  Never leave objects on stairs.  Place handrails on both sides of stairways and use them. Fix any loose handrails. Make sure handrails on both sides of the stairways are as long as the stairs.  Check carpeting to make sure it is firmly attached along stairs. Make repairs to worn or loose carpet promptly.  Avoid placing throw rugs at the top or bottom of stairways, or properly secure the rug with carpet tape to prevent slippage. Get rid of throw rugs, if possible.  Have an electrician put in a light switch at the top and bottom of the stairs. OTHER FALL PREVENTION TIPS  Wear low-heel or rubber-soled shoes that are supportive and fit well. Wear closed toe shoes.  When using a stepladder, make sure it is fully  opened and both spreaders are firmly locked. Do not climb a closed stepladder.  Add color or contrast paint or tape to grab bars and handrails in your home. Place contrasting color strips on first and last steps.  Learn and use mobility aids as needed. Install an electrical emergency response system.  Turn on lights to avoid dark areas. Replace light bulbs that burn out immediately. Get light switches that glow.  Arrange furniture to create clear pathways. Keep furniture in the same place.  Firmly attach  carpet with non-skid or double-sided tape.  Eliminate uneven floor surfaces.  Select a carpet pattern that does not visually hide the edge of steps.  Be aware of all pets. OTHER HOME SAFETY TIPS  Set the water temperature for 120 F (48.8 C).  Keep emergency numbers on or near the telephone.  Keep smoke detectors on every level of the home and near sleeping areas. Document Released: 12/16/2001 Document Revised: 06/27/2011 Document Reviewed: 03/17/2011 [facility] Patient Information 2014 [place].    Alcohol Intoxication Alcohol intoxication occurs when the amount of alcohol that a person has consumed impairs his or her ability to mentally and physically function. Alcohol directly impairs the normal chemical activity of the brain. Drinking large amounts of alcohol can lead to changes in mental function and behavior, and it can cause many physical effects that can be harmful.  Alcohol intoxication can range in severity from mild to very severe. Various factors can affect the level of intoxication that occurs, such as the person's age, gender, weight, frequency of alcohol consumption, and the presence of other medical conditions (such as diabetes, seizures, or heart conditions). Dangerous levels of alcohol intoxication may occur when people drink large amounts of alcohol in a short period (binge drinking). Alcohol can also be especially dangerous when combined with certain prescription medicines or "recreational" drugs. SIGNS AND SYMPTOMS Some common signs and symptoms of mild alcohol intoxication include:  Loss of coordination.  Changes in mood and behavior.  Impaired judgment.  Slurred speech. As alcohol intoxication progresses to more severe levels, other signs and symptoms will appear. These may include:  Vomiting.  Confusion and impaired memory.  Slowed breathing.  Seizures.  Loss of consciousness. DIAGNOSIS  Your health care provider will take a medical history  and perform a physical exam. You will be asked about the amount and type of alcohol you have consumed. Blood tests will be done to measure the concentration of alcohol in your blood. In many places, your blood alcohol level must be lower than 80 mg/dL (0.08%) to legally drive. However, many dangerous effects of alcohol can occur at much lower levels.  TREATMENT  People with alcohol intoxication often do not require treatment. Most of the effects of alcohol intoxication are temporary, and they go away as the alcohol naturally leaves the body. Your health care provider will monitor your condition until you are stable enough to go home. Fluids are sometimes given through an IV access tube to help prevent dehydration.  HOME CARE INSTRUCTIONS  Do not drive after drinking alcohol.  Stay hydrated. Drink enough water and fluids to keep your urine clear or pale yellow. Avoid caffeine.   Only take over-the-counter or prescription medicines as directed by your health care provider.  SEEK MEDICAL CARE IF:   You have persistent vomiting.   You do not feel better after a few days.  You have frequent alcohol intoxication. Your health care provider can help determine if you should see a substance use  treatment counselor. SEEK IMMEDIATE MEDICAL CARE IF:   You become shaky or tremble when you try to stop drinking.   You shake uncontrollably (seizure).   You throw up (vomit) blood. This may be bright red or may look like black coffee grounds.   You have blood in your stool. This may be bright red or may appear as a black, tarry, bad smelling stool.   You become lightheaded or faint.  MAKE SURE YOU:   Understand these instructions.  Will watch your condition.  Will get help right away if you are not doing well or get worse. Document Released: 10/05/2004 Document Revised: 08/28/2012 Document Reviewed: 05/31/2012 [facility] Patient Information 2014 [place].     Chest Contusion A  chest contusion is a deep bruise on your chest area. Contusions are the result of an injury that caused bleeding under the skin. A chest contusion may involve bruising of the skin, muscles, or ribs. The contusion may turn blue, purple, or yellow. Minor injuries will give you a painless contusion, but more severe contusions may stay painful and swollen for a few weeks. CAUSES  A contusion is usually caused by a blow, trauma, or direct force to an area of the body. SYMPTOMS   Swelling and redness of the injured area.  Discoloration of the injured area.  Tenderness and soreness of the injured area.  Pain. DIAGNOSIS  The diagnosis can be made by taking a history and performing a physical exam. An X-ray, CT scan, or MRI may be needed to determine if there were any associated injuries, such as broken bones (fractures) or internal injuries. TREATMENT  Often, the best treatment for a chest contusion is resting, icing, and applying cold compresses to the injured area. Deep breathing exercises may be recommended to reduce the risk of pneumonia. Over-the-counter medicines may also be recommended for pain control. HOME CARE INSTRUCTIONS   Put ice on the injured area.  Put ice in a plastic bag.  Place a towel between your skin and the bag.  Leave the ice on for 15-20 minutes, 03-04 times a day.  Only take over-the-counter or prescription medicines as directed by your caregiver. Your caregiver may recommend avoiding anti-inflammatory medicines (aspirin, ibuprofen, and naproxen) for 48 hours because these medicines may increase bruising.  Rest the injured area.  Perform deep-breathing exercises as directed by your caregiver.  Stop smoking if you smoke.  Do not lift objects over 5 pounds (2.3 kg) for 3 days or longer if recommended by your caregiver. SEEK IMMEDIATE MEDICAL CARE IF:   You have increased bruising or swelling.  You have pain that is getting worse.  You have difficulty  breathing.  You have dizziness, weakness, or fainting.  You have blood in your urine or stool.  You cough up or vomit blood.  Your swelling or pain is not relieved with medicines. MAKE SURE YOU:   Understand these instructions.  Will watch your condition.  Will get help right away if you are not doing well or get worse. Document Released: 09/20/2000 Document Revised: 09/20/2011 Document Reviewed: 06/19/2011 [facility] Patient Information 2014 [place].    Rib Contusion A rib contusion (bruise) can occur by a blow to the chest or by a fall against a hard object. Usually these will be much better in a couple weeks. If X-rays were taken today and there are no broken bones (fractures), the diagnosis of bruising is made. However, broken ribs may not show up for several days, or may  be discovered later on a routine X-ray when signs of healing show up. If this happens to you, it does not mean that something was missed on the X-ray, but simply that it did not show up on the first X-rays. Earlier diagnosis will not usually change the treatment. HOME CARE INSTRUCTIONS   Avoid strenuous activity. Be careful during activities and avoid bumping the injured ribs. Activities that pull on the injured ribs and cause pain should be avoided, if possible.  For the first day or two, an ice pack used every 20 minutes while awake may be helpful. Put ice in a plastic bag and put a towel between the bag and the skin.  Eat a normal, well-balanced diet. Drink plenty of fluids to avoid constipation.  Take deep breaths several times a day to keep lungs free of infection. Try to cough several times a day. Splint the injured area with a pillow while coughing to ease pain. Coughing can help prevent pneumonia.  Wear a rib belt or binder only if told to do so by your caregiver. If you are wearing a rib belt or binder, you must do the breathing exercises as directed by your caregiver. If not used properly, rib  belts or binders restrict breathing which can lead to pneumonia.  Only take over-the-counter or prescription medicines for pain, discomfort, or fever as directed by your caregiver. SEEK MEDICAL CARE IF:   You or your child has an oral temperature above 102 F (38.9 C).  Your baby is older than 3 months with a rectal temperature of 100.5 F (38.1 C) or higher for more than 1 day.  You develop a cough, with thick or bloody sputum. SEEK IMMEDIATE MEDICAL CARE IF:   You have difficulty breathing.  You feel sick to your stomach (nausea), have vomiting or belly (abdominal) pain.  You have worsening pain, not controlled with medications, or there is a change in the location of the pain.  You develop sweating or radiation of the pain into the arms, jaw or shoulders, or become light headed or faint.  You or your child has an oral temperature above 102 F (38.9 C), not controlled by medicine.  Your or your baby is older than 3 months with a rectal temperature of 102 F (38.9 C) or higher.  Your baby is 31 months old or younger with a rectal temperature of 100.4 F (38 C) or higher. MAKE SURE YOU:   Understand these instructions.  Will watch your condition.  Will get help right away if you are not doing well or get worse. Document Released: 09/20/2000 Document Revised: 04/22/2012 Document Reviewed: 08/14/2007 [facility] Patient Information 2014 [place].      Emergency Department Resource Guide 1) Find a Doctor and Pay Out of Pocket Although you won't have to find out who is covered by your insurance plan, it is a good idea to ask around and get recommendations. You will then need to call the office and see if the doctor you have chosen will accept you as a new patient and what types of options they offer for patients who are self-pay. Some doctors offer discounts or will set up payment plans for their patients who do not have insurance, but you will need to ask so you aren't  surprised when you get to your appointment.  2) Contact Your Local Health Department Not all health departments have doctors that can see patients for sick visits, but many do, so it is worth a call  to see if yours does. If you don't know where your local health department is, you can check in your phone book. The CDC also has a tool to help you locate your state's health department, and many state websites also have listings of all of their local health departments.  3) Find a [facility] If your illness is not likely to be very severe or complicated, you may want to try a walk in clinic. These are popping up all over the country in pharmacies, drugstores, and shopping centers. They're usually staffed by nurse practitioners or physician assistants that have been trained to treat common illnesses and complaints. They're usually fairly quick and inexpensive. However, if you have serious medical issues or chronic medical problems, these are probably not your best option.  No Primary Care Doctor: - Call Health Connect at  [phone] - they can help you locate a primary care doctor that  accepts your insurance, provides certain services, etc. - Physician Referral Service- [phone]  Chronic Pain Problems: Organization         Address  Phone   Notes  [facility]  [phone] Patients need to be referred by their primary care doctor.   Medication Assistance: Organization         Address  Phone   Notes  [facility] Medication [facility] [address] [place], [place] [address] [phone] --Must be a resident of [facility] -- Must have NO insurance coverage whatsoever (no Medicaid/ Medicare, etc.) -- The pt. MUST have a primary care doctor that directs their care regularly and follows them in the community   Med[facility]ist  [phone]   [employer]  [phone]    Agencies that provide inexpensive medical care: Organization          Address  Phone   Notes  [place]  [phone]   [facility]    [phone]   [facility] [place], [place] [address] [phone]   [place] [address] [place] [phone]   Planned Parenthood    [phone]   [facility]    [phone]   [place] and [address], [place] Phone:  [phone], Fax:  [phone] Hours of Operation:  9 am - 6 pm, M-F.  Also accepts Medicaid/Medicare and self-pay.  [facility] for [place], [address], [place] Phone: [phone], Fax: [phone]. Hours of Operation:  8:30 am - 5:30 pm, M-F.  Also accepts Medicaid and self-pay.  [facility] [place] [address] [place] Phone: [phone]   [place], [place], [place] [phone], Ext. [phone] Mondays & Thursdays: 7-9 AM.  First 15 patients are seen on a first come, first serve basis.    [place] Providers:  Organization         Address  Phone   Notes  [facility] [address], [place] [phone] Also accepts self-pay patients.  [place], [place]  [phone]   [place], [address], [place] [phone]   [place] [address] [place] [phone]   Lucianne Lei [address], [place]   [phone] Only accepts [place] Access Medicaid patients after they have their name applied to their card.   Self-Pay (no insurance) in [facility]:  Organization         Address  Phone   Notes  Sickle Cell Patients, [facility] [place] [phone]   [facility] [place] [phone]     [facility] [place]  [place], [place],  [phone]   [facility]/Dr. Osei-Bonsu  [address] [place] or [address], [place] [phone] Phone number for both [place] and [place] locations is the same.  Urgent Medical and [facility] [address] [place] [phone]   [facility] [address] [place] or [address] [phone]   [facility] [address], [place] [phone], phone; [phone], fax Sees patients 1st and 3rd Saturday of every month.  Must not qualify for public or private insurance (i.e. Medicaid, Medicare, [place] Health Choice, Veterans' Benefits)  Household income should be no more than 200% of the poverty level The clinic cannot treat you if you are pregnant or think you are pregnant  Sexually transmitted diseases are not treated at the clinic.    Dental Care: Organization         Address  Phone  Notes  [facility] Department of [facility] [place] [phone] Accepts children up to age 20 who are enrolled in [place] or [place]; pregnant women with a Medicaid card; and children who have applied for Medicaid or [place] Health Choice, but were declined, whose parents can pay a reduced fee at time of service.  [facility] Department of [facility]  [address], [place] [phone] Accepts children up to age 9 who are enrolled in [place] or [place]; pregnant women with a Medicaid card; and children who have applied for Medicaid or [place] Health Choice, but were declined, whose parents can pay a reduced fee at time of service.  [place] Adult Dental Access PROGRAM  [place] [phone] Patients are seen by appointment only. Walk-ins are not accepted. [place] will see patients 64  years of age and older. Monday - Tuesday (8am-5pm) Most Wednesdays (8:30-5pm) $30 per visit, cash only  [facility] Adult Dental Access PROGRAM  [address], [facility] [phone] Patients are seen by appointment only. Walk-ins are not accepted. [place] will see patients 28 years of age and older. One Wednesday Evening (Monthly: Volunteer Based).  $30 per visit, cash only  [place]  [phone] for adults; Children under age 55, call Graduate Pediatric Dentistry at [phone]. Children aged 47-14, please call [phone] to request a pediatric application.  Dental services are provided in all areas of dental care including fillings, crowns and bridges, complete and partial dentures, implants, gum treatment, root canals, and extractions. Preventive care is also provided. Treatment is provided to both adults and children. Patients are selected via a lottery and there is often a waiting list.   [facility] [address] [place]  [phone] [email or web address], [place], [place] [phone], Ext. [phone] Second and Fourth Thursday of each month, opens at  6:30 AM; Clinic ends at 9 AM.  Patients are seen on a first-come first-served basis, and a limited number are seen during each clinic.   [facility]  [address] Hillard Danker [place], [place] [phone]   Eligibility Requirements You must have lived in [place], [place], or [place] counties for at least the last three months.   You cannot be eligible for state or federal sponsored [employer], including [employer], [place], or [employer].   You generally cannot be eligible for healthcare insurance through your employer.    How to apply: Eligibility screenings are held every Tuesday and Wednesday afternoon from 1:00 pm until 4:00 pm. You do not need an appointment for the interview!  [facility]  [address] [place], [place]   [place]  [place] Department  [place]  [phone]    Behavioral Health Resources in the Community: Intensive Outpatient Programs Organization         Address  Phone  Notes  [place]. [address], [place], [place] [phone]   [facility] Outpatient [address] [place], [place]   ADS: Alcohol & Drug Svcs [address], [place], [place]   [place] [address]  [place], [place] or [phone]   Substance Abuse Resources Organization         Address  Phone  Notes  Alcohol and Drug Services  [phone]   [place]  [phone]   [place]   Chinita Pester  [phone]   Residential & Outpatient Substance Abuse Program  [phone]   Psychological Services Organization         Address  Phone  Notes  [facility] [place]  [place]  [phone]   [place] [address], [place] or [phone]    Mobile Crisis Teams Organization         Address  Phone  Notes  [facility], Mobile Crisis Care Unit  [phone]   [facility]  [address] [place], [place]   [place] [address] [place] [phone]    Self-Help/Support Groups Organization         Address  Phone             Notes  [place]. of [place] - variety of support groups  [place] Call for more information  Narcotics Anonymous (NA), [facility] [address], [employer] [place]  2 meetings at this location   Special educational needs teacher         Address  Phone  Notes  [facility] Residential Treatment [place],    [place]   [phone]   [facility]  [address], [place] [id number], [place], [place]   [place], [place] [phone] Admissions: 8am-3pm M-F  Incentives Substance [place] [address],    [place], [place] [phone]   [facility] [address] Jadene Pierini [place], [place]   [facility] [address],  [place], [place]   [facility] - Intensive Outpatient [address], [place], [place] [phone]   [facility] [place].,  [place], [place] or  [phone]   [facility]idential Treatment Services (RTS) [address] [place], [place] Accepts Medicaid  [facility] [address]  [place] [phone] Substance Abuse/Addiction Treatment   [facility] Organization         Address  Phone  Notes  [facility]  [phone]   Domenic Schwab, PhD [address] Arlis Porta [place], [place]   [phone] or [phone]   [place], [place] [phone]   [place], [place], [place] [phone] Insurance/Medicaid/sponsorship through [facility] and Families [address] [place]                                    [place], [place] [phone] [place] [address], [place] [phone]    Dr. Adele Schilder  [phone]   [facility] [place] Dept. 1) [address], [place] 2) [place] 3)  [place] [address], [place] [phone]  [phone]   [place] [phone] or [phone] (After Hours)

## 2013-03-23 NOTE — ED Notes (Signed)
Pt fell over a speed bump and injured his left side in the rib area, pt has etoh on board, pt states pain is worse with inspiration

## 2013-03-23 NOTE — ED Notes (Signed)
Pt's family phone numbers: Verdene Lennert [phone] Joycelyn Schmid [phone]

## 2013-03-23 NOTE — ED Provider Notes (Signed)
CSN: [id number]     Arrival date & time 03/23/13  0028 History   First MD Initiated Contact with Patient 03/23/13 0054     Chief Complaint  Patient presents with  . Fall  . Chest Pain     (Consider location/radiation/quality/duration/timing/severity/associated sxs/prior Treatment) Patient is a 58 y.o. male presenting with fall and chest pain. The history is provided by the patient.  Fall Associated symptoms include chest pain. Pertinent negatives include no abdominal pain, no headaches and no shortness of breath.  Chest Pain Associated symptoms: no abdominal pain, no back pain, no fever, no headache, no nausea, no numbness, no palpitations, no shortness of breath, not vomiting and no weakness   pt states had drank '3 cocktails' tonight, was dropped off at home, stumbled over a speed bump in his drive, and fell against chest wall. C/o chest wall pain since fall. Constant, dull, moderate, non radiating, worse w position change and palpation. No sob. Was asymptomatic prior to trip and fall. Denies loc. No head injury. No headache. No nv. No neck pain or stiffness. No back pain. Abrasion to right knee, states tetanus shot within past 2-3 years. Has been ambulatory post fall. No anticoag use. Pt denies other injury or pain.      Past Medical History  Diagnosis Date  . HIV infection   . Hypertension   . Diabetes mellitus without complication   . Depression   . Anxiety   . Hypercholesterolemia   . Cataract     right    Past Surgical History  Procedure Laterality Date  . Eye surgery  2005    catarct surgery  . Optic lens surgery  2005  . Colonoscopy with propofol  01/16/2012    Procedure: COLONOSCOPY WITH PROPOFOL;  Surgeon: Garlan Fair, MD;  Location: [facility];  Service: Endoscopy;  Laterality: N/A;   Family History  Problem Relation Age of Onset  . Arthritis Mother   . Diabetes Mother   . Heart disease Mother   . Arthritis Father   . Diabetes Father   . Heart disease  Father    History  Substance Use Topics  . Smoking status: Former Smoker -- 0.25 packs/day    Types: Cigarettes    Quit date: 01/09/2012  . Smokeless tobacco: Never Used  . Alcohol Use: Yes     Comment: socially    Review of Systems  Constitutional: Negative for fever.  HENT: Negative for nosebleeds.   Eyes: Negative for pain and visual disturbance.  Respiratory: Negative for shortness of breath.   Cardiovascular: Positive for chest pain. Negative for palpitations and leg swelling.  Gastrointestinal: Negative for nausea, vomiting and abdominal pain.  Genitourinary: Negative for flank pain.  Musculoskeletal: Negative for back pain and neck pain.  Skin: Positive for wound.  Neurological: Negative for weakness, numbness and headaches.  Hematological: Does not bruise/bleed easily.  Psychiatric/Behavioral: Negative for confusion.      Allergies  Glipizide  Home Medications   Current Outpatient Rx  Name  Route  Sig  Dispense  Refill  . atenolol (TENORMIN) 50 MG tablet   Oral   Take 1 tablet (50 mg total) by mouth daily.   30 tablet   0   . benazepril-hydrochlorthiazide (LOTENSIN HCT) 20-25 MG per tablet   Oral   Take 1 tablet by mouth daily before breakfast.   30 tablet   0   . cetirizine (ZYRTEC) 10 MG tablet   Oral   Take 10 mg by mouth  daily.         . clonazePAM (KLONOPIN) 1 MG tablet      take 1 tablet by mouth every morning and 2 at bedtime   90 tablet   3   . docusate sodium (COLACE) 50 MG capsule   Oral   Take by mouth daily.          . Emtricitab-Rilpivir-Tenofovir 200-25-300 MG TABS   Oral   Take 1 tablet by mouth daily.   30 tablet   11   . gabapentin (NEURONTIN) 100 MG capsule   Oral   Take 1 capsule (100 mg total) by mouth 3 (three) times daily.   90 capsule   3   . Glucerna (GLUCERNA) LIQD   Oral   Take 237 mLs by mouth daily.   30 Can   11   . ibuprofen (ADVIL,MOTRIN) 200 MG tablet   Oral   Take 800 mg by mouth every 6  (six) hours as needed. PAIN         . insulin aspart (NOVOLOG) 100 UNIT/ML FlexPen   Subcutaneous   Inject 16-24 Units into the skin 3 (three) times daily with meals. And at bedtime   20 mL   0     Future refills from PCP   . insulin glargine (LANTUS) 100 UNIT/ML injection   Subcutaneous   Inject 0.8 mLs (80 Units total) into the skin 2 (two) times daily.   30 mL   0   . Multiple Vitamin (MULTIVITAMIN) tablet   Oral   Take 1 tablet by mouth daily.           . pioglitazone (ACTOS) 30 MG tablet   Oral   Take 1 tablet (30 mg total) by mouth daily.   30 tablet   0   . pravastatin (PRAVACHOL) 40 MG tablet   Oral   Take 1 tablet (40 mg total) by mouth every evening.   30 tablet   0   . pseudoephedrine (SUDAFED) 30 MG tablet   Oral   Take 1 tablet (30 mg total) by mouth every 4 (four) hours as needed for congestion.   30 tablet   0   . sertraline (ZOLOFT) 100 MG tablet   Oral   Take 1 tablet (100 mg total) by mouth daily before breakfast.   30 tablet   6   . traZODone (DESYREL) 100 MG tablet   Oral   Take 2 tablets (200 mg total) by mouth at bedtime.   60 tablet   11    BP 142/77  Pulse 75  Temp(Src) 98.3 F (36.8 C) (Oral)  Resp 18  SpO2 94% Physical Exam  Nursing note and vitals reviewed. Constitutional: He is oriented to person, place, and time. He appears well-developed and well-nourished. No distress.  HENT:  Head: Atraumatic.  No facial or scalp contusion, pain, or tenderness.   Eyes: Conjunctivae are normal. Pupils are equal, round, and reactive to light.  Neck: Normal range of motion. Neck supple. No tracheal deviation present.  Cardiovascular: Normal rate, regular rhythm, normal heart sounds and intact distal pulses.   Pulmonary/Chest: Effort normal and breath sounds normal. No accessory muscle usage. No respiratory distress. He exhibits tenderness.  Anterior chest wall tenderness reproducing symptoms. Symmetric chest movement/excursion. No  crepitus.   Abdominal: Soft. Bowel sounds are normal. He exhibits no distension and no mass. There is no tenderness. There is no rebound and no guarding.  No abd wall contusion or bruising.  Musculoskeletal: Normal range of motion. He exhibits no edema and no tenderness.  CTLS spine, non tender, aligned, no step off. Good rom bil ext without pain or focal bony tenderness.  Abrasion right knee. No fb. No effusion. Knee stable. Distal pulses palp.   Neurological: He is alert and oriented to person, place, and time.  Motor intact bil.   Skin: Skin is warm and dry. He is not diaphoretic.  Psychiatric: He has a normal mood and affect.    ED Course  Procedures (including critical care time)   Dg Chest 2 View  03/23/2013   CLINICAL DATA:  Ex-smoker.  Fell 6 hr ago.  Chest pain.  EXAM: CHEST  2 VIEW  COMPARISON:  None.  FINDINGS: Enlarged cardiac silhouette. Mildly prominent pulmonary vasculature. Poor inspiration. Mild linear density at both lung bases. No fracture or pneumothorax seen.  IMPRESSION: 1. Poor inspiration with mild bibasilar atelectasis or scarring. 2. Cardiomegaly and mild pulmonary vascular congestion.   Electronically Signed   By: Enrique Sack M.D.   On: 03/23/2013 01:21      MDM  Cxr.  Reviewed nursing notes and prior charts for additional history.   Pt has ride, does not have to drive. No meds pta.  vicodin po.  Pt s/p mechanical fall. No loc. No anticoag use. cxr neg acute.   Pt comfortable on recheck. Spine nt.  Pt appears stable for d/c.       Mirna Mires, MD 03/23/13 [phone]

## 2013-03-23 NOTE — ED Notes (Signed)
Pt reports he was walking home tonight after having "3 cocktails", tripped on a speed bump and fell.  Pt reports landing on his L side, reports L side chest pain, pain is worse when taking a deep breath.  Pt denies hitting his head.  Small abrasion noted on his L elbow.

## 2013-03-25 ENCOUNTER — Other Ambulatory Visit (INDEPENDENT_AMBULATORY_CARE_PROVIDER_SITE_OTHER): Payer: Commercial Managed Care - HMO

## 2013-03-25 ENCOUNTER — Encounter: Payer: Self-pay | Admitting: Physician Assistant

## 2013-03-25 ENCOUNTER — Ambulatory Visit (INDEPENDENT_AMBULATORY_CARE_PROVIDER_SITE_OTHER): Payer: Commercial Managed Care - HMO | Admitting: Physician Assistant

## 2013-03-25 VITALS — BP 108/70 | HR 89 | Temp 98.3°F | Ht 73.0 in

## 2013-03-25 DIAGNOSIS — F329 Major depressive disorder, single episode, unspecified: Secondary | ICD-10-CM

## 2013-03-25 DIAGNOSIS — I10 Essential (primary) hypertension: Secondary | ICD-10-CM

## 2013-03-25 DIAGNOSIS — S20219A Contusion of unspecified front wall of thorax, initial encounter: Secondary | ICD-10-CM

## 2013-03-25 DIAGNOSIS — G589 Mononeuropathy, unspecified: Secondary | ICD-10-CM

## 2013-03-25 DIAGNOSIS — F32A Depression, unspecified: Secondary | ICD-10-CM

## 2013-03-25 DIAGNOSIS — E785 Hyperlipidemia, unspecified: Secondary | ICD-10-CM

## 2013-03-25 DIAGNOSIS — J309 Allergic rhinitis, unspecified: Secondary | ICD-10-CM

## 2013-03-25 DIAGNOSIS — F3289 Other specified depressive episodes: Secondary | ICD-10-CM

## 2013-03-25 DIAGNOSIS — Z0000 Encounter for general adult medical examination without abnormal findings: Secondary | ICD-10-CM

## 2013-03-25 DIAGNOSIS — G629 Polyneuropathy, unspecified: Secondary | ICD-10-CM

## 2013-03-25 DIAGNOSIS — Z131 Encounter for screening for diabetes mellitus: Secondary | ICD-10-CM

## 2013-03-25 DIAGNOSIS — J302 Other seasonal allergic rhinitis: Secondary | ICD-10-CM

## 2013-03-25 DIAGNOSIS — E119 Type 2 diabetes mellitus without complications: Secondary | ICD-10-CM

## 2013-03-25 DIAGNOSIS — S20212A Contusion of left front wall of thorax, initial encounter: Secondary | ICD-10-CM

## 2013-03-25 DIAGNOSIS — B20 Human immunodeficiency virus [HIV] disease: Secondary | ICD-10-CM

## 2013-03-25 LAB — TSH: TSH: 0.87 u[IU]/mL (ref 0.35–5.50)

## 2013-03-25 LAB — HEMOGLOBIN A1C: Hgb A1c MFr Bld: 11.1 % — ABNORMAL HIGH (ref 4.6–6.5)

## 2013-03-25 NOTE — Progress Notes (Signed)
Patient ID: Nicholas Whitehead is a 58 y.o. male DOB: [birth year] MRN: [id number]     HPI:  Patient is a 58 year old male who presents to the office to establish care. Patient reports an extensive medical history including the following. Reports he follows with Infectious Disease Dr every three months for HIV evaluation. Follows with podiatrist for diabetic foot care and neuropathy.   1.Hypertension Treated with Benazepril - HCTZ and Tenormin  2. Hyperlipidemia Treated with prevastatin  3.depression treated with Sertraline, Trazodone, Clonazepam  4. Seasonal allergies Treated with Zrytec  5 diabetes Treated with Actos and Insulin glargine, insuline aspart  6. diabetic neuropathy Treated with Gabapentin  7. HIV Treated with Complera  8 constipation Treated with docusate sodium  Reports was seen at ED 2 day PTA after a fall. States had been drinking and was walking to his home when he stumbled over a speed bump on the road. States fell forward landing on his left anterior and side chest. Was evaluated by ED and told no fractures. States he is still having pain on his chest wall and it hurts to breath. Was provided Norco for pain which he states is not helping. Since his injury he reports he is not able to stand up and straighten his chest or take deep breaths. Denies SOB, cough, chest tightness, Numbness, tingling, extremity pain or swelling, N/V/F.    Influenza: 02/27/13 Pneumonia: 09/02/10 Tetanus: unknown Eye Dr. unknown   ROS: As stated in HPI. All other systems negative  Past Medical History  Diagnosis Date  . HIV infection   . Hypertension   . Diabetes mellitus without complication   . Depression   . Anxiety   . Hypercholesterolemia   . Cataract     right   . DM (diabetes mellitus screen)   . HIV disease 09/12/1995  . Hyperlipidemia    Family History  Problem Relation Age of Onset  . Arthritis Mother   . Diabetes Mother   . Heart disease Mother   . Arthritis  Father   . Diabetes Father   . Heart disease Father    History   Social History  . Marital Status: Single    Spouse Name: N/A    Number of Children: N/A  . Years of Education: N/A   Social History Main Topics  . Smoking status: Former Smoker -- 0.25 packs/day    Types: Cigarettes    Quit date: 01/09/2012  . Smokeless tobacco: Never Used  . Alcohol Use: Yes     Comment: socially  . Drug Use: No  . Sexual Activity: No     Comment: pt.given condoms,started back smoking in November 2013   Other Topics Concern  . None   Social History Narrative  . None   Past Surgical History  Procedure Laterality Date  . Eye surgery  2005    catarct surgery  . Optic lens surgery  2005  . Colonoscopy with propofol  01/16/2012    Procedure: COLONOSCOPY WITH PROPOFOL;  Surgeon: Garlan Fair, MD;  Location: [facility];  Service: Endoscopy;  Laterality: N/A;   Current Outpatient Prescriptions on File Prior to Visit  Medication Sig Dispense Refill  . aspirin EC 81 MG tablet Take 81 mg by mouth every morning.      Marland Kitchen atenolol (TENORMIN) 50 MG tablet Take 1 tablet (50 mg total) by mouth daily.  30 tablet  0  . benazepril-hydrochlorthiazide (LOTENSIN HCT) 20-25 MG per tablet Take 1 tablet by mouth daily  before breakfast.  30 tablet  0  . cetirizine (ZYRTEC) 10 MG tablet Take 10 mg by mouth daily.      . clonazePAM (KLONOPIN) 1 MG tablet Take 1-2 mg by mouth 2 (two) times daily. Take 1 tablet in the morning and 2 tablets at bedtime      . COD LIVER OIL PO Take 1 capsule by mouth every morning.      . docusate sodium (COLACE) 100 MG capsule Take 100 mg by mouth every morning.      Marland Kitchen Emtricitab-Rilpivir-Tenofovir 200-25-300 MG TABS Take 1 tablet by mouth daily.  30 tablet  11  . gabapentin (NEURONTIN) 100 MG capsule Take 1 capsule (100 mg total) by mouth 3 (three) times daily.  90 capsule  3  . HYDROcodone-acetaminophen (NORCO/VICODIN) 5-325 MG per tablet Take 1-2 tablets by mouth every 6 (six)  hours as needed for moderate pain.  20 tablet  0  . insulin aspart (NOVOLOG) 100 UNIT/ML injection Inject 16-24 Units into the skin 4 (four) times daily -  with meals and at bedtime.      . insulin glargine (LANTUS) 100 UNIT/ML injection Inject 100 Units into the skin 2 (two) times daily.      . Multiple Vitamin (MULTIVITAMIN) tablet Take 1 tablet by mouth daily.        . pioglitazone (ACTOS) 30 MG tablet Take 1 tablet (30 mg total) by mouth daily.  30 tablet  0  . pravastatin (PRAVACHOL) 40 MG tablet Take 1 tablet (40 mg total) by mouth every evening.  30 tablet  0  . sertraline (ZOLOFT) 100 MG tablet Take 1 tablet (100 mg total) by mouth daily before breakfast.  30 tablet  6  . traZODone (DESYREL) 100 MG tablet Take 2 tablets (200 mg total) by mouth at bedtime.  60 tablet  11   No current facility-administered medications on file prior to visit.   Allergies  Allergen Reactions  . Glipizide Nausea Only    PE:  Filed Vitals:   03/25/13 1131  BP: 108/70  Pulse: 89  Temp: 98.3 F (36.8 C)    CONSTITUTIONAL: thin, frail appearing, pleasant male, appears older than stated age, seated slouched in wheel chair with cane at his side. Refuse to stand or ambulate during physical exam due to pain. Soft spoken. HEENT: normocephalic, atraumatic, bilateral ext/int canals normal. Bilateral TM's without injections, bulging, erythema. Nose normal, uvula midline, oropharynx clear and moist. EYES: PERRLA, bilateral EOM and conjunctiva normal NECK: FROM, supple, without thyromegaly or mass CARDIO: RRR, normal S1 and S2, distal pulses intact. PULM/CHEST CTA bilateral, no wheezes, rales or rhonchi. Decreased breath sounds in all lobes. Chest wall is TTP of left anterior and lateral aspect. No ecchymosis noted. Chest wall is symmetric with inhalation. ABD: appearance normal, soft, nontender. Normal bowel sounds x 4 quadrants. GU: deferred.  MUSC: decreased ROM of left upper extremity secondary to chest  wall pain. Able to flex and extend bilateral Lower extremity without difficulty, well healing abrasion noted to right knee.  LYMPH: no cervical, supraclavicular adenopathy NEURO: alert and oriented x 3, no cranial nerve deficit, good muscle strength with good sensation in bilateral lower and right upper extremities. Muscle strength of extremities tested with patient in seated position.Unable to access gait. SKIN: warm, dry, no rash or lesions noted. PSYCH: Mood and affect normal, speech normal.   Lab Results  Component Value Date   WBC 9.6 02/24/2013   HGB 15.9 02/24/2013   HCT 46.7 02/24/2013  PLT 128* 02/24/2013   GLUCOSE 245* 02/24/2013   CHOL 134 08/18/2010   TRIG 179* 08/18/2010   HDL 41 08/18/2010   LDLCALC 57 08/18/2010   ALT 31 02/24/2013   AST 33 02/24/2013   NA 137 02/24/2013   K 3.9 02/24/2013   CL 100 02/24/2013   CREATININE 0.97 02/24/2013   BUN 14 02/24/2013   CO2 29 02/24/2013   TSH 0.87 03/25/2013   HGBA1C 11.1* 03/25/2013   MICROALBUR 1.79 08/12/2009     ASSESSMENT and PLAN   CPX/v70.0 - Patient has been counseled on age-appropriate routine health concerns for screening and prevention. These are reviewed and up-to-date. Immunizations are up-to-date or declined. Labs ordered and will be reviewed reviewed.  1.Hypertension Treated with Benazepril - HCTZ and Tenormin  2. Hyperlipidemia Treated with prevastatin  3.depression treated with Sertraline, Trazodone, Clonazepam  4. Seasonal allergies Treated with Zrytec  5 diabetes Treated with Actos and Insulin glargine, insuline aspart  6. diabetic neuropathy Treated with Gabapentin  7. HIV Treated with Complera  8 constipation Treated with docusate sodium  9. Chest wall contusion Patient encouraged to continue taking Hydrocodone - acetaminophen as prescribed and to practice deep breathing exercises. Instructed patient need to attempt to straighten back and elevate chest wall to allow for deep breathing. Instructed and  demonstrated to take a minimum of 10 deep breaths per hour for pneumonia prevention. Patient replied with "not going to tell you I'll do something if I don't think I will."

## 2013-03-25 NOTE — Patient Instructions (Signed)
It was great meeting you today Nicholas Whitehead!  I have ordered a few labs for you, please report to the lab in the lower level to have these completed.  Continue taking all medications as prescribed.  Hypertension Hypertension is another name for high blood pressure. High blood pressure may mean that your heart needs to work harder to pump blood. Blood pressure consists of two numbers, which includes a higher number over a lower number (example: 110/72). HOME CARE   Make lifestyle changes as told by your doctor. This may include weight loss and exercise.  Take your blood pressure medicine every day.  Limit how much salt you use.  Stop smoking if you smoke.  Do not use drugs.  Talk to your doctor if you are using decongestants or birth control pills. These medicines might make blood pressure higher.  Females should not drink more than 1 alcoholic drink per day. Males should not drink more than 2 alcoholic drinks per day.  See your doctor as told. GET HELP RIGHT AWAY IF:   You have a blood pressure reading with a top number of 180 or higher.  You get a very bad headache.  You get blurred or changing vision.  You feel confused.  You feel weak, numb, or faint.  You get chest or belly (abdominal) pain.  You throw up (vomit).  You cannot breathe very well. MAKE SURE YOU:   Understand these instructions.  Will watch your condition.  Will get help right away if you are not doing well or get worse. Document Released: 06/14/2007 Document Revised: 03/20/2011 Document Reviewed: 06/14/2007 [facility] Patient Information 2014 [place], [place].  Diabetes and Foot Care Diabetes may cause you to have problems because of poor blood supply (circulation) to your feet and legs. This may cause the skin on your feet to become thinner, break easier, and heal more slowly. Your skin may become dry, and the skin may peel and crack. You may also have nerve damage in your legs and feet causing  decreased feeling in them. You may not notice minor injuries to your feet that could lead to infections or more serious problems. Taking care of your feet is one of the most important things you can do for yourself.  HOME CARE INSTRUCTIONS  Wear shoes at all times, even in the house. Do not go barefoot. Bare feet are easily injured.  Check your feet daily for blisters, cuts, and redness. If you cannot see the bottom of your feet, use a mirror or ask someone for help.  Wash your feet with warm water (do not use hot water) and mild soap. Then pat your feet and the areas between your toes until they are completely dry. Do not soak your feet as this can dry your skin.  Apply a moisturizing lotion or petroleum jelly (that does not contain alcohol and is unscented) to the skin on your feet and to dry, brittle toenails. Do not apply lotion between your toes.  Trim your toenails straight across. Do not dig under them or around the cuticle. File the edges of your nails with an emery board or nail file.  Do not cut corns or calluses or try to remove them with medicine.  Wear clean socks or stockings every day. Make sure they are not too tight. Do not wear knee-high stockings since they may decrease blood flow to your legs.  Wear shoes that fit properly and have enough cushioning. To break in new shoes, wear them for  just a few hours a day. This prevents you from injuring your feet. Always look in your shoes before you put them on to be sure there are no objects inside.  Do not cross your legs. This may decrease the blood flow to your feet.  If you find a minor scrape, cut, or break in the skin on your feet, keep it and the skin around it clean and dry. These areas may be cleansed with mild soap and water. Do not cleanse the area with peroxide, alcohol, or iodine.  When you remove an adhesive bandage, be sure not to damage the skin around it.  If you have a wound, look at it several times a day to make  sure it is healing.  Do not use heating pads or hot water bottles. They may burn your skin. If you have lost feeling in your feet or legs, you may not know it is happening until it is too late.  Make sure your health care provider performs a complete foot exam at least annually or more often if you have foot problems. Report any cuts, sores, or bruises to your health care provider immediately. SEEK MEDICAL CARE IF:   You have an injury that is not healing.  You have cuts or breaks in the skin.  You have an ingrown nail.  You notice redness on your legs or feet.  You feel burning or tingling in your legs or feet.  You have pain or cramps in your legs and feet.  Your legs or feet are numb.  Your feet always feel cold. SEEK IMMEDIATE MEDICAL CARE IF:   There is increasing redness, swelling, or pain in or around a wound.  There is a red line that goes up your leg.  Pus is coming from a wound.  You develop a fever or as directed by your health care provider.  You notice a bad smell coming from an ulcer or wound. Document Released: 12/24/1999 Document Revised: 08/28/2012 Document Reviewed: 06/04/2012 [facility] Patient Information 2014 [place].  Cholesterol Cholesterol is a type of fat. Your body needs a small amount of cholesterol, but too much can cause health problems. Certain problems include heart attacks, strokes, and not enough blood flow to your heart, brain, kidneys, or feet. You get cholesterol in 2 ways:  Naturally.  By eating certain foods. HOME CARE  Eat a low-fat diet:  Eat less eggs, whole dairy products (whole milk, cheese, and butter), fatty meats, and fried foods.  Eat more fruits, vegetables, whole-wheat breads, lean chicken, and fish.  Follow your exercise program as told by your doctor.  Keep your weight at a healthy level. Talk to your doctor about what is right for you.  Only take medicine as told by your doctor.  Get your cholesterol  checked once a year or as told by your doctor. MAKE SURE YOU:  Understand these instructions.  Will watch your condition.  Will get help right away if you are not doing well or get worse. Document Released: 03/24/2008 Document Revised: 03/20/2011 Document Reviewed: 10/09/2012 [facility] Patient Information 2014 [place], [place].

## 2013-03-25 NOTE — Progress Notes (Signed)
Pre visit review using our clinic review tool, if applicable. No additional management support is needed unless otherwise documented below in the visit note. 

## 2013-03-26 ENCOUNTER — Other Ambulatory Visit: Payer: Self-pay | Admitting: Physician Assistant

## 2013-03-26 DIAGNOSIS — E119 Type 2 diabetes mellitus without complications: Secondary | ICD-10-CM

## 2013-03-27 NOTE — Assessment & Plan Note (Signed)
Treated with Actos and Insulin glargine, insuline aspart

## 2013-03-27 NOTE — Assessment & Plan Note (Signed)
Treated with prevastatin

## 2013-03-27 NOTE — Assessment & Plan Note (Signed)
Treated with Complera Sees Infectious Disease provider

## 2013-03-27 NOTE — Assessment & Plan Note (Signed)
Treated with Benazepril - HCTZ and Tenormin

## 2013-03-27 NOTE — Assessment & Plan Note (Signed)
treated with Sertraline, Trazodone, Clonazepam

## 2013-03-31 ENCOUNTER — Encounter: Payer: Self-pay | Admitting: Endocrinology

## 2013-03-31 ENCOUNTER — Ambulatory Visit (INDEPENDENT_AMBULATORY_CARE_PROVIDER_SITE_OTHER): Payer: Medicare HMO | Admitting: Endocrinology

## 2013-03-31 VITALS — BP 108/60 | HR 63 | Temp 97.7°F | Ht 73.0 in | Wt 181.0 lb

## 2013-03-31 DIAGNOSIS — Z131 Encounter for screening for diabetes mellitus: Secondary | ICD-10-CM

## 2013-03-31 NOTE — Progress Notes (Signed)
Subjective:    Patient ID: Nicholas Whitehead, male    DOB: March 01, 1955, 58 y.o.   MRN: [id number]  HPI pt states DM was dx'ed in 1996; he has moderate neuropathy of the lower extremities, but he is unaware of any associated chronic complications.  he has been on insulin since approx 2001.  He takes multiple daily injections.  pt says his diet and exercise are not very good.  Past Medical History  Diagnosis Date  . HIV infection   . Hypertension   . Diabetes mellitus without complication   . Depression   . Anxiety   . Hypercholesterolemia   . Cataract     right   . DM (diabetes mellitus screen)   . HIV disease 09/12/1995  . Hyperlipidemia     Past Surgical History  Procedure Laterality Date  . Eye surgery  2005    catarct surgery  . Optic lens surgery  2005  . Colonoscopy with propofol  01/16/2012    Procedure: COLONOSCOPY WITH PROPOFOL;  Surgeon: Garlan Fair, MD;  Location: [facility];  Service: Endoscopy;  Laterality: N/A;    History   Social History  . Marital Status: Single    Spouse Name: N/A    Number of Children: N/A  . Years of Education: N/A   Occupational History  . Not on file.   Social History Main Topics  . Smoking status: Former Smoker -- 0.25 packs/day    Types: Cigarettes    Quit date: 01/09/2012  . Smokeless tobacco: Never Used  . Alcohol Use: Yes     Comment: socially  . Drug Use: No  . Sexual Activity: No     Comment: pt.given condoms,started back smoking in November 2013   Other Topics Concern  . Not on file   Social History Narrative  . No narrative on file    Current Outpatient Prescriptions on File Prior to Visit  Medication Sig Dispense Refill  . aspirin EC 81 MG tablet Take 81 mg by mouth every morning.      Marland Kitchen atenolol (TENORMIN) 50 MG tablet Take 1 tablet (50 mg total) by mouth daily.  30 tablet  0  . benazepril-hydrochlorthiazide (LOTENSIN HCT) 20-25 MG per tablet Take 1 tablet by mouth daily before breakfast.  30 tablet  0    . cetirizine (ZYRTEC) 10 MG tablet Take 10 mg by mouth daily.      . clonazePAM (KLONOPIN) 1 MG tablet Take 1-2 mg by mouth 2 (two) times daily. Take 1 tablet in the morning and 2 tablets at bedtime      . COD LIVER OIL PO Take 1 capsule by mouth every morning.      . docusate sodium (COLACE) 100 MG capsule Take 100 mg by mouth every morning.      Marland Kitchen Emtricitab-Rilpivir-Tenofovir 200-25-300 MG TABS Take 1 tablet by mouth daily.  30 tablet  11  . gabapentin (NEURONTIN) 100 MG capsule Take 1 capsule (100 mg total) by mouth 3 (three) times daily.  90 capsule  3  . HYDROcodone-acetaminophen (NORCO/VICODIN) 5-325 MG per tablet Take 1-2 tablets by mouth every 6 (six) hours as needed for moderate pain.  20 tablet  0  . insulin aspart (NOVOLOG) 100 UNIT/ML injection Inject 16-24 Units into the skin 4 (four) times daily -  with meals and at bedtime.      . insulin glargine (LANTUS) 100 UNIT/ML injection Inject 100 Units into the skin 2 (two) times daily.      Marland Kitchen  Melatonin 3 MG TABS Take by mouth at bedtime.      . Multiple Vitamin (MULTIVITAMIN) tablet Take 1 tablet by mouth daily.        . pioglitazone (ACTOS) 30 MG tablet Take 1 tablet (30 mg total) by mouth daily.  30 tablet  0  . pravastatin (PRAVACHOL) 40 MG tablet Take 1 tablet (40 mg total) by mouth every evening.  30 tablet  0  . sertraline (ZOLOFT) 100 MG tablet Take 1 tablet (100 mg total) by mouth daily before breakfast.  30 tablet  6  . traZODone (DESYREL) 100 MG tablet Take 2 tablets (200 mg total) by mouth at bedtime.  60 tablet  11   No current facility-administered medications on file prior to visit.    Allergies  Allergen Reactions  . Glipizide Nausea Only    Family History  Problem Relation Age of Onset  . Arthritis Mother   . Diabetes Mother   . Heart disease Mother   . Arthritis Father   . Diabetes Father   . Heart disease Father     BP 108/60  Pulse 63  Temp(Src) 97.7 F (36.5 C) (Oral)  Ht 6\' 1"  (1.854 m)  Wt 181  lb (82.101 kg)  BMI 23.89 kg/m2  SpO2 95%  Review of Systems Pt says he is under care for depression.  This is helping, but depression is still affecting his ability to care for his DM.  He has mild hypoglycemia approx once a week, usually in the afternoon.      Objective:   Physical Exam VITAL SIGNS:  See vs page GENERAL: no distress.  Lab Results  Component Value Date   HGBA1C 11.1* 03/25/2013      Assessment & Plan:  DM: poor control Depression: this complicates the rx of DM.

## 2013-03-31 NOTE — Patient Instructions (Addendum)
good diet and exercise habits significanly improve the control of your diabetes.  please let me know if you wish to be referred to a dietician.  high blood sugar is very risky to your health.  you should see an eye doctor every year.  You are at higher than average risk for pneumonia and hepatitis-B.  You should be vaccinated against both.   controlling your blood pressure and cholesterol drastically reduces the damage diabetes does to your body.  this also applies to quitting smoking.  please discuss these with your doctor.   check your blood sugar 3 times a day.  vary the time of day when you check, between before the 3 meals, and at bedtime.  also check if you have symptoms of your blood sugar being too high or too low.  please keep a record of the readings and bring it to your next appointment with Dr Dwyane Dee.  You can write it on any piece of paper.  please call us sooner if your blood sugar goes below 70, or if you have a lot of readings over 200. This is the next step in your diabetes care.   Please see dr Dwyane Dee soon.

## 2013-04-02 ENCOUNTER — Other Ambulatory Visit: Payer: Self-pay | Admitting: Endocrinology

## 2013-04-02 DIAGNOSIS — E1165 Type 2 diabetes mellitus with hyperglycemia: Principal | ICD-10-CM

## 2013-04-02 DIAGNOSIS — IMO0001 Reserved for inherently not codable concepts without codable children: Secondary | ICD-10-CM

## 2013-04-08 ENCOUNTER — Encounter: Payer: Self-pay | Admitting: Internal Medicine

## 2013-04-08 ENCOUNTER — Ambulatory Visit (INDEPENDENT_AMBULATORY_CARE_PROVIDER_SITE_OTHER): Payer: Commercial Managed Care - HMO | Admitting: Internal Medicine

## 2013-04-08 VITALS — BP 103/65 | HR 71 | Temp 97.8°F | Ht 73.0 in | Wt 182.0 lb

## 2013-04-08 DIAGNOSIS — F32A Depression, unspecified: Secondary | ICD-10-CM

## 2013-04-08 DIAGNOSIS — R52 Pain, unspecified: Secondary | ICD-10-CM

## 2013-04-08 DIAGNOSIS — B20 Human immunodeficiency virus [HIV] disease: Secondary | ICD-10-CM

## 2013-04-08 DIAGNOSIS — F329 Major depressive disorder, single episode, unspecified: Secondary | ICD-10-CM

## 2013-04-08 DIAGNOSIS — F3289 Other specified depressive episodes: Secondary | ICD-10-CM

## 2013-04-08 MED ORDER — IBUPROFEN 200 MG PO TABS
400.0000 mg | ORAL_TABLET | Freq: Once | ORAL | Status: AC
  Administered 2013-04-08: 400 mg via ORAL

## 2013-04-08 NOTE — Assessment & Plan Note (Signed)
Doing well from an HIV standpoint.  RTC 4 months

## 2013-04-08 NOTE — Progress Notes (Signed)
  Subjective:    Patient ID: Nicholas Whitehead, male    DOB: 07/07/1955, 58 y.o.   MRN: [id number]  HPI  He comes in for followup of his HIV. He was seen last month and with ongoing depression his Atripla was changed to Complera.  He reports excellent compliance in taking medication with a high fatty meal. He also continues to suffer from depression though has noted some improvement since stopping Sustiva.  He is now following with Grayland Ormond. He does continue to take his medication including higher dose of the benzodiazepine. He also was seen in the ED after a fall.  No fx, just sore muscles.  Now has a PCP.    Review of Systems  Eyes: Positive for visual disturbance.       Will discuss with pcp  Cardiovascular: Negative for leg swelling.  Gastrointestinal: Negative for nausea.  Musculoskeletal: Negative for back pain.  Skin: Negative for rash.  Neurological: Negative for dizziness, light-headedness and headaches.  Psychiatric/Behavioral: Positive for dysphoric mood. Negative for suicidal ideas.       Objective:   Physical Exam  Constitutional: He appears well-developed and well-nourished. No distress.  HENT:  Mouth/Throat: No oropharyngeal exudate.  Eyes: No scleral icterus.  Cardiovascular: Normal rate, regular rhythm and normal heart sounds.   No murmur heard. Pulmonary/Chest: Effort normal and breath sounds normal. No respiratory distress.  Lymphadenopathy:    He has no cervical adenopathy.  Neurological: He is alert.  Skin: No rash noted.  Psychiatric: His behavior is normal.          Assessment & Plan:

## 2013-04-08 NOTE — Assessment & Plan Note (Signed)
Pleased with our counselor and group.

## 2013-04-08 NOTE — Addendum Note (Signed)
Addended by: Myrtis Hopping A on: 04/08/2013 12:20 PM   Modules accepted: Orders

## 2013-04-17 ENCOUNTER — Ambulatory Visit (INDEPENDENT_AMBULATORY_CARE_PROVIDER_SITE_OTHER): Payer: Commercial Managed Care - HMO | Admitting: Podiatry

## 2013-04-17 ENCOUNTER — Encounter: Payer: Self-pay | Admitting: Podiatry

## 2013-04-17 VITALS — BP 106/59 | HR 70 | Resp 16

## 2013-04-17 DIAGNOSIS — B351 Tinea unguium: Secondary | ICD-10-CM

## 2013-04-17 DIAGNOSIS — M79609 Pain in unspecified limb: Secondary | ICD-10-CM | POA: Diagnosis not present

## 2013-04-17 DIAGNOSIS — E1059 Type 1 diabetes mellitus with other circulatory complications: Secondary | ICD-10-CM | POA: Diagnosis not present

## 2013-04-17 DIAGNOSIS — Q828 Other specified congenital malformations of skin: Secondary | ICD-10-CM

## 2013-04-17 NOTE — Patient Instructions (Signed)
Diabetes and Foot Care Diabetes may cause you to have problems because of poor blood supply (circulation) to your feet and legs. This may cause the skin on your feet to become thinner, break easier, and heal more slowly. Your skin may become dry, and the skin may peel and crack. You may also have nerve damage in your legs and feet causing decreased feeling in them. You may not notice minor injuries to your feet that could lead to infections or more serious problems. Taking care of your feet is one of the most important things you can do for yourself.  HOME CARE INSTRUCTIONS  Wear shoes at all times, even in the house. Do not go barefoot. Bare feet are easily injured.  Check your feet daily for blisters, cuts, and redness. If you cannot see the bottom of your feet, use a mirror or ask someone for help.  Wash your feet with warm water (do not use hot water) and mild soap. Then pat your feet and the areas between your toes until they are completely dry. Do not soak your feet as this can dry your skin.  Apply a moisturizing lotion or petroleum jelly (that does not contain alcohol and is unscented) to the skin on your feet and to dry, brittle toenails. Do not apply lotion between your toes.  Trim your toenails straight across. Do not dig under them or around the cuticle. File the edges of your nails with an emery board or nail file.  Do not cut corns or calluses or try to remove them with medicine.  Wear clean socks or stockings every day. Make sure they are not too tight. Do not wear knee-high stockings since they may decrease blood flow to your legs.  Wear shoes that fit properly and have enough cushioning. To break in new shoes, wear them for just a few hours a day. This prevents you from injuring your feet. Always look in your shoes before you put them on to be sure there are no objects inside.  Do not cross your legs. This may decrease the blood flow to your feet.  If you find a minor scrape,  cut, or break in the skin on your feet, keep it and the skin around it clean and dry. These areas may be cleansed with mild soap and water. Do not cleanse the area with peroxide, alcohol, or iodine.  When you remove an adhesive bandage, be sure not to damage the skin around it.  If you have a wound, look at it several times a day to make sure it is healing.  Do not use heating pads or hot water bottles. They may burn your skin. If you have lost feeling in your feet or legs, you may not know it is happening until it is too late.  Make sure your health care provider performs a complete foot exam at least annually or more often if you have foot problems. Report any cuts, sores, or bruises to your health care provider immediately. SEEK MEDICAL CARE IF:   You have an injury that is not healing.  You have cuts or breaks in the skin.  You have an ingrown nail.  You notice redness on your legs or feet.  You feel burning or tingling in your legs or feet.  You have pain or cramps in your legs and feet.  Your legs or feet are numb.  Your feet always feel cold. SEEK IMMEDIATE MEDICAL CARE IF:   There is increasing redness,   swelling, or pain in or around a wound.  There is a red line that goes up your leg.  Pus is coming from a wound.  You develop a fever or as directed by your health care provider.  You notice a bad smell coming from an ulcer or wound. Document Released: 12/24/1999 Document Revised: 08/28/2012 Document Reviewed: 06/04/2012 Exit[employer] Patient Information 2014 [employer].  

## 2013-04-20 NOTE — Progress Notes (Signed)
Subjective:     Patient ID: Nicholas Whitehead, male   DOB: 23-Apr-1955, 58 y.o.   MRN: [id number]  HPI patient presents with nail disease that are thick dystrophic and impossible to cut for him 1-5 both feet in lesions on the plantar aspect of both feet that are painful. States that he cannot take care of them himself due to poor health and long-term diabetes   Review of Systems     Objective:   Physical Exam Neurovascular status unchanged from previous visit with patient well oriented x3 and is noted to have incurvated nailbeds 1-5 bilateral with thickness and also is found to have keratotic lesion sub-fifth metatarsals both feet that are painful when pressed    Assessment:     At risk diabetic with lesions sub-5 both feet and nail disease 1-5 both feet    Plan:     Debridement nailbeds 1-5 both feet with no iatrogenic bleeding noted and debridement lesions on the plantar aspect fifth metatarsal both feet with no bleeding noted

## 2013-04-22 ENCOUNTER — Ambulatory Visit: Payer: Medicare HMO

## 2013-04-22 NOTE — Progress Notes (Signed)
Nicholas Whitehead was in a pleasant mood today and reported that he continues to attend a support group at the [facility] every Thursday.  He said many good things about this group - how accepting they are, how interested they seem to be in every member, etc.  He continues to be frustrated with his sister and great nephews (ages 49 and 26), who are temporarily staying with him.  He said "I never wanted children" and he feels like his personal space is compromised by sharing it with them.  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  He talked about how gratifying it is to be a part of these groups.  Plan to meet in 2 weeks.

## 2013-04-29 ENCOUNTER — Other Ambulatory Visit: Payer: Self-pay | Admitting: Licensed Clinical Social Worker

## 2013-04-29 MED ORDER — INSULIN GLARGINE 100 UNIT/ML ~~LOC~~ SOLN: 100.0000 [IU] | Freq: Two times a day (BID) | SUBCUTANEOUS | Status: DC

## 2013-04-29 MED ORDER — ATENOLOL 50 MG PO TABS: 50.0000 mg | ORAL_TABLET | Freq: Every day | ORAL | Status: DC

## 2013-04-30 ENCOUNTER — Other Ambulatory Visit: Payer: Self-pay | Admitting: Licensed Clinical Social Worker

## 2013-04-30 MED ORDER — INSULIN GLARGINE 100 UNITS/ML SOLOSTAR PEN: 80.0000 [IU] | PEN_INJECTOR | Freq: Two times a day (BID) | SUBCUTANEOUS | Status: DC

## 2013-04-30 MED ORDER — PIOGLITAZONE HCL 30 MG PO TABS: 30.0000 mg | ORAL_TABLET | Freq: Every day | ORAL | Status: DC

## 2013-05-05 ENCOUNTER — Ambulatory Visit (INDEPENDENT_AMBULATORY_CARE_PROVIDER_SITE_OTHER): Payer: Commercial Managed Care - HMO | Admitting: Endocrinology

## 2013-05-05 ENCOUNTER — Encounter: Payer: Self-pay | Admitting: Endocrinology

## 2013-05-05 VITALS — BP 110/62 | HR 68 | Temp 97.9°F | Resp 14 | Ht 73.0 in | Wt 182.4 lb

## 2013-05-05 DIAGNOSIS — E1142 Type 2 diabetes mellitus with diabetic polyneuropathy: Secondary | ICD-10-CM

## 2013-05-05 DIAGNOSIS — IMO0001 Reserved for inherently not codable concepts without codable children: Secondary | ICD-10-CM | POA: Diagnosis not present

## 2013-05-05 DIAGNOSIS — Z131 Encounter for screening for diabetes mellitus: Secondary | ICD-10-CM | POA: Diagnosis not present

## 2013-05-05 DIAGNOSIS — E785 Hyperlipidemia, unspecified: Secondary | ICD-10-CM | POA: Diagnosis not present

## 2013-05-05 DIAGNOSIS — I10 Essential (primary) hypertension: Secondary | ICD-10-CM

## 2013-05-05 DIAGNOSIS — E1149 Type 2 diabetes mellitus with other diabetic neurological complication: Secondary | ICD-10-CM | POA: Diagnosis not present

## 2013-05-05 DIAGNOSIS — E1165 Type 2 diabetes mellitus with hyperglycemia: Principal | ICD-10-CM

## 2013-05-05 LAB — BASIC METABOLIC PANEL
BUN: 11 mg/dL (ref 6–23)
CHLORIDE: 101 meq/L (ref 96–112)
CO2: 29 mEq/L (ref 19–32)
Calcium: 9.2 mg/dL (ref 8.4–10.5)
Creatinine, Ser: 0.8 mg/dL (ref 0.4–1.5)
GFR: 128.84 mL/min (ref >60.00)
GLUCOSE: 149 mg/dL — AB (ref 70–99)
POTASSIUM: 4 meq/L (ref 3.5–5.1)
SODIUM: 141 meq/L (ref 135–145)

## 2013-05-05 LAB — URINALYSIS
BILIRUBIN URINE: NEGATIVE
Hgb urine dipstick: NEGATIVE
KETONES UR: NEGATIVE
LEUKOCYTES UA: NEGATIVE
Nitrite: NEGATIVE
SPECIFIC GRAVITY, URINE: 1.015 (ref 1.000–1.030)
Total Protein, Urine: NEGATIVE
URINE GLUCOSE: NEGATIVE
Urobilinogen, UA: 1 (ref 0.0–1.0)
pH: 6 (ref 5.0–8.0)

## 2013-05-05 LAB — LIPID PANEL
CHOL/HDL RATIO: 3
Cholesterol: 113 mg/dL (ref 0–200)
HDL: 32.7 mg/dL — AB (ref >39.00)
LDL Cholesterol: 51 mg/dL (ref 0–99)
TRIGLYCERIDES: 147 mg/dL (ref 0.0–149.0)
VLDL: 29.4 mg/dL (ref 0.0–40.0)

## 2013-05-05 LAB — HEMOGLOBIN A1C: HEMOGLOBIN A1C: 9.2 % — AB (ref 4.6–6.5)

## 2013-05-05 LAB — MICROALBUMIN / CREATININE URINE RATIO
CREATININE, U: 77.2 mg/dL
Microalb Creat Ratio: 0.6 mg/g (ref 0.0–30.0)
Microalb, Ur: 0.5 mg/dL (ref 0.0–1.9)

## 2013-05-05 NOTE — Patient Instructions (Signed)
Invokana in am   Please check blood sugars at least half the time about 2 hours after any meal and as directed on waking up. Please bring blood sugar monitor to each visit  Reduce Benazepril to 1/2 daily  When sugar <100 reduce doses 5-10 units

## 2013-05-05 NOTE — Progress Notes (Addendum)
Patient ID: Nicholas Whitehead, male   DOB: 03-02-1955, 58 y.o.   MRN: [id number]   Reason for Appointment: Type II Diabetes follow-up   History of Present Illness   Diagnosis date: 1998    Previous history: He was started on oral agents at diagnosis and around 2006 was put on insulin  Recent history:  His blood sugars appear to be usually well controlled when he takes Actos along with his insulin. He has usually been taking very large doses of insulin especially basal On his last visit in 6/14 his A1c was excellent at 5.9 and he was taking about 25-30 units of NovoLog with his 80 units of Lantus twice a day He did not followup since then However he thinks that because of depression and also going on a cruise last July he is not doing well with his day-to-day management especially diet. Also was irregular with insulin Blood sugars were markedly increased recently with A1c of 11.7 in 3/15 Since starting back on his insulin regimen as before his blood sugars are improving but still relatively high during the day Taking somewhat more doses of NovoLog and before Overall he thinks he is watching his diet better with avoiding simple sugars and snacks Although he is back on his insulin as before and Actos for the last month or so his blood sugars have not come down to good levels as yet     Oral hypoglycemic drugs: Actos 30 mg        Side effects from medications: None Insulin regimen: Lantus 80 bid, Novolog 40, 30 acl, 50 ACC supper         Proper timing of medications in relation to meals: Yes.          Monitors blood glucose:  2-3 times a day     Glucometer: ? One Touch.          Blood Glucose readings from recall: readings before breakfast:  146, 238 Lunch and Supper 200, hs 148 last night  Hypoglycemia:  none         Meals: 3 meals per day.          Physical activity: exercise: minimal, had a fall and is still having pain           Dietician visit: Most recent: 2013   Weight control:  Wt  Readings from Last 3 Encounters:  05/05/13 182 lb 6.4 oz (82.736 kg)  04/08/13 182 lb (82.555 kg)  03/31/13 181 lb (82.101 kg)          Complications:     Diabetes labs:  Lab Results  Component Value Date   HGBA1C 11.1* 03/25/2013   HGBA1C 6.7* 10/11/2009   HGBA1C 9.8* 03/09/2009   Lab Results  Component Value Date   MICROALBUR 1.79 08/12/2009   LDLCALC 57 08/18/2010   CREATININE 0.97 02/24/2013       Medication List       This list is accurate as of: 05/05/13  3:15 PM.  Always use your most recent med list.               aspirin EC 81 MG tablet  Take 81 mg by mouth every morning.     atenolol 50 MG tablet  Commonly known as:  TENORMIN  Take 1 tablet (50 mg total) by mouth daily.     benazepril-hydrochlorthiazide 20-25 MG per tablet  Commonly known as:  LOTENSIN HCT  Take 1 tablet by mouth daily before breakfast.  cetirizine 10 MG tablet  Commonly known as:  ZYRTEC  Take 10 mg by mouth daily.     clonazePAM 1 MG tablet  Commonly known as:  KLONOPIN  Take 1-2 mg by mouth 2 (two) times daily. Take 1 tablet in the morning and 2 tablets at bedtime     COD LIVER OIL PO  Take 1 capsule by mouth every morning.     docusate sodium 100 MG capsule  Commonly known as:  COLACE  Take 100 mg by mouth every morning.     Emtricitab-Rilpivir-Tenofovir 200-25-300 MG Tabs  Take 1 tablet by mouth daily.     gabapentin 100 MG capsule  Commonly known as:  NEURONTIN  Take 1 capsule (100 mg total) by mouth 3 (three) times daily.     HYDROcodone-acetaminophen 5-325 MG per tablet  Commonly known as:  NORCO/VICODIN  Take 1-2 tablets by mouth every 6 (six) hours as needed for moderate pain.     insulin aspart 100 UNIT/ML injection  Commonly known as:  novoLOG  Inject 16-24 Units into the skin 4 (four) times daily -  with meals and at bedtime.     insulin glargine 100 unit/mL Sopn  Commonly known as:  LANTUS  Inject 0.8 mLs (80 Units total) into the skin 2 (two) times daily.      Melatonin 3 MG Tabs  Take by mouth at bedtime.     multivitamin tablet  Take 1 tablet by mouth daily.     pioglitazone 30 MG tablet  Commonly known as:  ACTOS  Take 1 tablet (30 mg total) by mouth daily.     pravastatin 40 MG tablet  Commonly known as:  PRAVACHOL  Take 1 tablet (40 mg total) by mouth every evening.     sertraline 100 MG tablet  Commonly known as:  ZOLOFT  Take 1 tablet (100 mg total) by mouth daily before breakfast.     traZODone 100 MG tablet  Commonly known as:  DESYREL  Take 2 tablets (200 mg total) by mouth at bedtime.        Allergies:  Allergies  Allergen Reactions  . Glipizide Nausea Only    Past Medical History  Diagnosis Date  . HIV infection   . Hypertension   . Diabetes mellitus without complication   . Depression   . Anxiety   . Hypercholesterolemia   . Cataract     right   . DM (diabetes mellitus screen)   . HIV disease 09/12/1995  . Hyperlipidemia     Past Surgical History  Procedure Laterality Date  . Eye surgery  2005    catarct surgery  . Optic lens surgery  2005  . Colonoscopy with propofol  01/16/2012    Procedure: COLONOSCOPY WITH PROPOFOL;  Surgeon: Garlan Fair, MD;  Location: [facility];  Service: Endoscopy;  Laterality: N/A;    Family History  Problem Relation Age of Onset  . Arthritis Mother   . Diabetes Mother   . Heart disease Mother   . Arthritis Father   . Diabetes Father   . Heart disease Father     Social History:  reports that he quit smoking about 15 months ago. His smoking use included Cigarettes. He smoked 0.25 packs per day. He has never used smokeless tobacco. He reports that he drinks alcohol. He reports that he does not use illicit drugs.  Review of Systems:  Hypertension:  currently on benazepril/HCT  Lipids: Last LDL was 68 with taking pravastatin  He has symptoms of neuropathy with paresthesia in feet treated with gabapentin    LABS:  No visits with results within 1 Week(s)  from this visit. Latest known visit with results is:  Appointment on 03/25/2013  Component Date Value Ref Range Status  . TSH 03/25/2013 0.87  0.35 - 5.50 uIU/mL Final  . Hemoglobin A1C 03/25/2013 11.1* 4.6 - 6.5 % Final   Glycemic Control Guidelines for People with Diabetes:Non Diabetic:  <6%Goal of Therapy: <7%Additional Action Suggested:  >8%      Examination:   BP 110/62  Pulse 68  Temp(Src) 97.9 F (36.6 C)  Resp 14  Ht 6\' 1"  (1.854 m)  Wt 182 lb 6.4 oz (82.736 kg)  BMI 24.07 kg/m2  SpO2 96%  Body mass index is 24.07 kg/(m^2).   No ankle edema  ASSESSMENT/ PLAN:    Diabetes type 2: He has had insulin resistance despite his normal BMI and overall appears to have excellent control when he is compliant with his insulin, diet, exercise. In the past has had significant benefits using Actos to help with insulin resistance Until last month his blood sugar was out of control because of noncompliance on insulin and self-care as above Blood glucose control is appearing improved although postprandial readings are significantly high and it appears to need more basal insulin during the day Because of persistently poor control this month will have him add Invokana Discussed how this works and benefits as well as effects on weight, blood pressure, fluid status and possible side effects and management He will be started on 100 mg daily He will increase his NovoLog based on blood sugars as before and most likely will be able to cut down his insulin doses once the Invokana is effective Needs to be followup in 3 weeks  Hypertension: Since blood pressure is low normal he will cut down his Lotensin HCT to half a tablet  Depression: He needs to work with his PCP for treatment  Elayne Snare 05/05/2013, 3:15 PM

## 2013-05-06 ENCOUNTER — Ambulatory Visit: Payer: Commercial Managed Care - HMO

## 2013-05-06 DIAGNOSIS — F330 Major depressive disorder, recurrent, mild: Secondary | ICD-10-CM

## 2013-05-06 NOTE — Progress Notes (Signed)
Nicholas Whitehead was pleasant today and reported that he continues to enjoy attending a support group at the [facility] every week.  He talked about his ongoing frustration with having his sister and grand nephews living with him in his apartment.  She is waiting on a settlement from a car wreck and is also applying for disability, meaning that she will likely stay with him a while longer.  He talked some today about when he was first diagnosed with HIV and how sick he was.  Overall, he seems to be stable at this time.

## 2013-05-07 LAB — FRUCTOSAMINE: Fructosamine: 338 umol/L — ABNORMAL HIGH (ref 190–270)

## 2013-05-13 ENCOUNTER — Ambulatory Visit: Payer: Medicare HMO | Admitting: *Deleted

## 2013-05-13 ENCOUNTER — Other Ambulatory Visit: Payer: Self-pay | Admitting: Licensed Clinical Social Worker

## 2013-05-16 ENCOUNTER — Ambulatory Visit: Payer: Medicare HMO | Admitting: Internal Medicine

## 2013-05-21 ENCOUNTER — Encounter: Payer: Self-pay | Admitting: Internal Medicine

## 2013-05-21 ENCOUNTER — Ambulatory Visit (INDEPENDENT_AMBULATORY_CARE_PROVIDER_SITE_OTHER): Payer: Commercial Managed Care - HMO | Admitting: Internal Medicine

## 2013-05-21 ENCOUNTER — Other Ambulatory Visit (INDEPENDENT_AMBULATORY_CARE_PROVIDER_SITE_OTHER): Payer: Commercial Managed Care - HMO

## 2013-05-21 VITALS — BP 94/62 | HR 64 | Temp 98.2°F | Resp 22 | Ht 73.0 in | Wt 182.4 lb

## 2013-05-21 DIAGNOSIS — Z0000 Encounter for general adult medical examination without abnormal findings: Secondary | ICD-10-CM

## 2013-05-21 DIAGNOSIS — N400 Enlarged prostate without lower urinary tract symptoms: Secondary | ICD-10-CM

## 2013-05-21 DIAGNOSIS — E785 Hyperlipidemia, unspecified: Secondary | ICD-10-CM

## 2013-05-21 DIAGNOSIS — M19019 Primary osteoarthritis, unspecified shoulder: Secondary | ICD-10-CM | POA: Insufficient documentation

## 2013-05-21 DIAGNOSIS — I10 Essential (primary) hypertension: Secondary | ICD-10-CM

## 2013-05-21 DIAGNOSIS — E1149 Type 2 diabetes mellitus with other diabetic neurological complication: Secondary | ICD-10-CM

## 2013-05-21 DIAGNOSIS — Z23 Encounter for immunization: Secondary | ICD-10-CM

## 2013-05-21 LAB — LIPID PANEL
CHOLESTEROL: 97 mg/dL (ref 0–200)
HDL: 34.7 mg/dL — ABNORMAL LOW (ref >39.00)
LDL Cholesterol: 45 mg/dL (ref 0–99)
Total CHOL/HDL Ratio: 3
Triglycerides: 85 mg/dL (ref 0.0–149.0)
VLDL: 17 mg/dL (ref 0.0–40.0)

## 2013-05-21 LAB — PSA: PSA: 0.71 ng/mL (ref 0.10–4.00)

## 2013-05-21 LAB — HM DIABETES FOOT EXAM

## 2013-05-21 LAB — FECAL OCCULT BLOOD, GUAIAC: FECAL OCCULT BLD: NEGATIVE

## 2013-05-21 MED ORDER — BENAZEPRIL HCL 20 MG PO TABS: 20.0000 mg | ORAL_TABLET | Freq: Every day | ORAL | Status: DC

## 2013-05-21 MED ORDER — CANAGLIFLOZIN 100 MG PO TABS: 1.0000 | ORAL_TABLET | Freq: Every day | ORAL | Status: DC

## 2013-05-21 MED ORDER — HYDROCODONE-ACETAMINOPHEN 5-325 MG PO TABS: 1.0000 | ORAL_TABLET | Freq: Four times a day (QID) | ORAL | Status: DC | PRN

## 2013-05-21 NOTE — Patient Instructions (Addendum)
Hypertension As your heart beats, it forces blood through your arteries. This force is your blood pressure. If the pressure is too high, it is called hypertension (HTN) or high blood pressure. HTN is dangerous because you may have it and not know it. High blood pressure may mean that your heart has to work harder to pump blood. Your arteries may be narrow or stiff. The extra work puts you at risk for heart disease, stroke, and other problems.  Blood pressure consists of two numbers, a higher number over a lower, 110/72, for example. It is stated as "110 over 72." The ideal is below 120 for the top number (systolic) and under 80 for the bottom (diastolic). Write down your blood pressure today. You should pay close attention to your blood pressure if you have certain conditions such as:  Heart failure.  Prior heart attack.  Diabetes  Chronic kidney disease.  Prior stroke.  Multiple risk factors for heart disease. To see if you have HTN, your blood pressure should be measured while you are seated with your arm held at the level of the heart. It should be measured at least twice. A one-time elevated blood pressure reading (especially in the Emergency Department) does not mean that you need treatment. There may be conditions in which the blood pressure is different between your right and left arms. It is important to see your caregiver soon for a recheck. Most people have essential hypertension which means that there is not a specific cause. This type of high blood pressure may be lowered by changing lifestyle factors such as:  Stress.  Smoking.  Lack of exercise.  Excessive weight.  Drug/tobacco/alcohol use.  Eating less salt. Most people do not have symptoms from high blood pressure until it has caused damage to the body. Effective treatment can often prevent, delay or reduce that damage. TREATMENT  When a cause has been identified, treatment for high blood pressure is directed at the  cause. There are a large number of medications to treat HTN. These fall into several categories, and your caregiver will help you select the medicines that are best for you. Medications may have side effects. You should review side effects with your caregiver. If your blood pressure stays high after you have made lifestyle changes or started on medicines,   Your medication(s) may need to be changed.  Other problems may need to be addressed.  Be certain you understand your prescriptions, and know how and when to take your medicine.  Be sure to follow up with your caregiver within the time frame advised (usually within two weeks) to have your blood pressure rechecked and to review your medications.  If you are taking more than one medicine to lower your blood pressure, make sure you know how and at what times they should be taken. Taking two medicines at the same time can result in blood pressure that is too low. SEEK IMMEDIATE MEDICAL CARE IF:  You develop a severe headache, blurred or changing vision, or confusion.  You have unusual weakness or numbness, or a faint feeling.  You have severe chest or abdominal pain, vomiting, or breathing problems. MAKE SURE YOU:   Understand these instructions.  Will watch your condition.  Will get help right away if you are not doing well or get worse. Document Released: 12/26/2004 Document Revised: 03/20/2011 Document Reviewed: 08/16/2007 [facility] Patient Information 2014 [place]. Health Maintenance, Males A healthy lifestyle and preventative care can promote health and wellness.  Maintain  regular health, dental, and eye exams.  Eat a healthy diet. Foods like vegetables, fruits, whole grains, low-fat dairy products, and lean protein foods contain the nutrients you need and are low in calories. Decrease your intake of foods high in solid fats, added sugars, and salt. Get information about a proper diet from your health care provider, if  necessary.  Regular physical exercise is one of the most important things you can do for your health. Most adults should get at least 150 minutes of moderate-intensity exercise (any activity that increases your heart rate and causes you to sweat) each week. In addition, most adults need muscle-strengthening exercises on 2 or more days a week.   Maintain a healthy weight. The body mass index (BMI) is a screening tool to identify possible weight problems. It provides an estimate of body fat based on height and weight. Your health care provider can find your BMI and can help you achieve or maintain a healthy weight. For males 20 years and older:  A BMI below 18.5 is considered underweight.  A BMI of 18.5 to 24.9 is normal.  A BMI of 25 to 29.9 is considered overweight.  A BMI of 30 and above is considered obese.  Maintain normal blood lipids and cholesterol by exercising and minimizing your intake of saturated fat. Eat a balanced diet with plenty of fruits and vegetables. Blood tests for lipids and cholesterol should begin at age 57 and be repeated every 5 years. If your lipid or cholesterol levels are high, you are over 50, or you are at high risk for heart disease, you may need your cholesterol levels checked more frequently.Ongoing high lipid and cholesterol levels should be treated with medicines, if diet and exercise are not working.  If you smoke, find out from your health care provider how to quit. If you do not use tobacco, do not start.  Lung cancer screening is recommended for adults aged 50 80 years who are at high risk for developing lung cancer because of a history of smoking. A yearly low-dose CT scan of the lungs is recommended for people who have at least a 30-pack-year history of smoking and are a current smoker or have quit within the past 15 years. A pack year of smoking is smoking an average of 1 pack of cigarettes a day for 1 year (for example, a 30-pack-year history of smoking  could mean smoking 1 pack a day for 30 years or 2 packs a day for 15 years). Yearly screening should continue until the smoker has stopped smoking for at least 15 years. Yearly screening should be stopped for people who develop a health problem that would prevent them from having lung cancer treatment.  If you choose to drink alcohol, do not have more than 2 drinks per day. One drink is considered to be 12 oz (360 mL) of beer, 5 oz (150 mL) of wine, or 1.5 oz (45 mL) of liquor.  Avoid use of street drugs. Do not share needles with anyone. Ask for help if you need support or instructions about stopping the use of drugs.  High blood pressure causes heart disease and increases the risk of stroke. Blood pressure should be checked at least every 1 2 years. Ongoing high blood pressure should be treated with medicines if weight loss and exercise are not effective.  If you are 44 56 years old, ask your health care provider if you should take aspirin to prevent heart disease.  Diabetes screening involves  taking a blood sample to check your fasting blood sugar level. This should be done once every 3 years after age 38, if you are at a normal weight and without risk factors for diabetes. Testing should be considered at a younger age or be carried out more frequently if you are overweight and have at least 1 risk factor for diabetes.  Colorectal cancer can be detected and often prevented. Most routine colorectal cancer screening begins at the age of 29 and continues through age 2. However, your health care provider may recommend screening at an earlier age if you have risk factors for colon cancer. On a yearly basis, your health care provider may provide home test kits to check for hidden blood in the stool. A small camera at the end of a tube may be used to directly examine the colon (sigmoidoscopy or colonoscopy) to detect the earliest forms of colorectal cancer. Talk to your health care provider about this at  age 49, when routine screening begins. A direct exam of the colon should be repeated every 5 10 years through age 46, unless early forms of pre-cancerous polyps or small growths are found.  People who are at an increased risk for hepatitis B should be screened for this virus. You are considered at high risk for hepatitis B if:  You were born in a country where hepatitis B occurs often. Talk with your health care provider about which countries are considered high-risk.  Your parents were born in a high-risk country and you have not received a shot to protect against hepatitis B (hepatitis B vaccine).  You have HIV or AIDS.  You use needles to inject street drugs.  You live with, or have sex with, someone who has hepatitis B.  You are a man who has sex with other men (MSM).  You get hemodialysis treatment.  You take certain medicines for conditions like cancer, organ transplantation, and autoimmune conditions.  Hepatitis C blood testing is recommended for all people born from [birth year] through 1963 and any individual with known risk factors for hepatitis C.  Healthy men should no longer receive prostate-specific antigen (PSA) blood tests as part of routine cancer screening. Talk to your health care provider about prostate cancer screening.  Testicular cancer screening is not recommended for adolescents or adult males who have no symptoms. Screening includes self-exam, a health care provider exam, and other screening tests. Consult with your health care provider about any symptoms you have or any concerns you have about testicular cancer.  Practice safe sex. Use condoms and avoid high-risk sexual practices to reduce the spread of sexually transmitted infections (STIs).  Use sunscreen. Apply sunscreen liberally and repeatedly throughout the day. You should seek shade when your shadow is shorter than you. Protect yourself by wearing long sleeves, pants, a wide-brimmed hat, and sunglasses year  round, whenever you are outdoors.  Tell your health care provider of new moles or changes in moles, especially if there is a change in shape or color. Also tell your provider if a mole is larger than the size of a pencil eraser.  A one-time screening for abdominal aortic aneurysm (AAA) and surgical repair of large AAAs by ultrasound is recommended for men aged 21 75 years who are current or former smokers.  Stay current with your vaccines (immunizations). Document Released: 06/24/2007 Document Revised: 10/16/2012 Document Reviewed: 05/23/2010 [facility] Patient Information 2014 [place], [place].

## 2013-05-21 NOTE — Progress Notes (Signed)
Subjective:    Patient ID: Nicholas Whitehead, male    DOB: 18-Sep-1955, 58 y.o.   MRN: [id number]  Arthritis Presents for follow-up visit. The disease course has been fluctuating. He complains of pain. He reports no stiffness, joint swelling or joint warmth. Affected locations include the right shoulder and left shoulder. His pain is at a severity of 3/10. Associated symptoms include pain at night and pain while resting. Pertinent negatives include no diarrhea, dry eyes, dry mouth, dysuria, fatigue, fever, rash, Raynaud's syndrome, uveitis or weight loss. His past medical history is significant for osteoarthritis. His pertinent risk factors include overuse. Past treatments include an opioid. The treatment provided moderate relief. Factors aggravating his arthritis include activity. Compliance with prior treatments has been good.      Review of Systems  Constitutional: Negative.  Negative for fever, chills, weight loss, diaphoresis, appetite change and fatigue.  HENT: Negative.   Eyes: Negative.   Respiratory: Negative.  Negative for cough, choking, chest tightness, shortness of breath, wheezing and stridor.   Cardiovascular: Negative.  Negative for chest pain, palpitations and leg swelling.  Gastrointestinal: Negative.  Negative for nausea, vomiting, abdominal pain, diarrhea, constipation, blood in stool and rectal pain.  Endocrine: Negative.  Negative for polydipsia, polyphagia and polyuria.  Genitourinary: Negative for dysuria.  Musculoskeletal: Positive for arthralgias and arthritis. Negative for back pain, gait problem, joint swelling, myalgias, neck pain, neck stiffness and stiffness.  Skin: Negative.  Negative for rash.  Allergic/Immunologic: Positive for immunocompromised state.  Neurological: Negative.  Negative for tremors, syncope, weakness, light-headedness, numbness and headaches.  Hematological: Negative.  Negative for adenopathy. Does not bruise/bleed easily.    Psychiatric/Behavioral: Negative.        Objective:   Physical Exam  Vitals reviewed. Constitutional: He is oriented to person, place, and time. He appears well-developed and well-nourished. No distress.  HENT:  Head: Normocephalic and atraumatic.  Mouth/Throat: Oropharynx is clear and moist. No oropharyngeal exudate.  Eyes: Conjunctivae are normal. Right eye exhibits no discharge. Left eye exhibits no discharge. No scleral icterus.  Neck: Normal range of motion. Neck supple. No JVD present. No tracheal deviation present. No thyromegaly present.  Cardiovascular: Normal rate, regular rhythm, normal heart sounds and intact distal pulses.  Exam reveals no gallop and no friction rub.   No murmur heard. Pulmonary/Chest: Effort normal and breath sounds normal. No stridor. No respiratory distress. He has no wheezes. He has no rales. He exhibits no tenderness.  Abdominal: Soft. Bowel sounds are normal. He exhibits no distension and no mass. There is no tenderness. There is no rebound and no guarding. Hernia confirmed negative in the right inguinal area and confirmed negative in the left inguinal area.  Genitourinary: Rectum normal and penis normal. Rectal exam shows no external hemorrhoid, no internal hemorrhoid, no fissure, no mass, no tenderness and anal tone normal. Guaiac negative stool. Prostate is enlarged (1+ smooth symm BPH). Prostate is not tender. Right testis shows no mass, no swelling and no tenderness. Right testis is descended. Left testis shows no mass, no swelling and no tenderness. Left testis is descended. Circumcised. No penile erythema or penile tenderness. No discharge found.  Musculoskeletal: Normal range of motion. He exhibits no edema and no tenderness.  Lymphadenopathy:    He has no cervical adenopathy.       Right: No inguinal adenopathy present.       Left: No inguinal adenopathy present.  Neurological: He is oriented to person, place, and time.  Skin: Skin is warm  and  dry. No rash noted. He is not diaphoretic. No erythema. No pallor.  Psychiatric: He has a normal mood and affect. His behavior is normal. Judgment and thought content normal.     Lab Results  Component Value Date   WBC 9.6 02/24/2013   HGB 15.9 02/24/2013   HCT 46.7 02/24/2013   PLT 128* 02/24/2013   GLUCOSE 149* 05/05/2013   CHOL 113 05/05/2013   TRIG 147.0 05/05/2013   HDL 32.70* 05/05/2013   LDLCALC 51 05/05/2013   ALT 31 02/24/2013   AST 33 02/24/2013   NA 141 05/05/2013   K 4.0 05/05/2013   CL 101 05/05/2013   CREATININE 0.8 05/05/2013   BUN 11 05/05/2013   CO2 29 05/05/2013   TSH 0.87 03/25/2013   HGBA1C 9.2* 05/05/2013   MICROALBUR 0.5 05/05/2013       Assessment & Plan:

## 2013-05-21 NOTE — Progress Notes (Signed)
Pre visit review using our clinic review tool, if applicable. No additional management support is needed unless otherwise documented below in the visit note. 

## 2013-05-22 ENCOUNTER — Encounter: Payer: Self-pay | Admitting: Internal Medicine

## 2013-05-22 NOTE — Assessment & Plan Note (Addendum)

## 2013-05-22 NOTE — Assessment & Plan Note (Signed)
His PSA is normal and there are no s/s that need to be treated

## 2013-05-22 NOTE — Assessment & Plan Note (Signed)
His blood sugars are well controlled 

## 2013-05-22 NOTE — Assessment & Plan Note (Signed)
He will cont norco as needed for pain 

## 2013-05-22 NOTE — Assessment & Plan Note (Signed)
His BP is well controlled 

## 2013-05-23 ENCOUNTER — Telehealth: Payer: Self-pay

## 2013-05-23 NOTE — Telephone Encounter (Signed)
Received pharmacy rejection stating that insurance will not cover [employer] without a prior authorization. PA submitted via cover my meds and approved per insurance, pharmacy notified.

## 2013-06-05 ENCOUNTER — Ambulatory Visit: Payer: Commercial Managed Care - HMO

## 2013-06-05 DIAGNOSIS — F330 Major depressive disorder, recurrent, mild: Secondary | ICD-10-CM

## 2013-06-05 NOTE — Progress Notes (Signed)
Nicholas Whitehead was in a very good mood today, laughing a good part of the session.  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  They performed 15 songs and he got to see old friends, which he said he thoroughly enjoyed.  He also reported that he continues to go to a support group at [facility] and has really benefited from this.  He has made some friends out of this group and has even met them outside of the group.  Plan to meet in 2 weeks.

## 2013-06-06 ENCOUNTER — Other Ambulatory Visit: Payer: Self-pay | Admitting: Licensed Clinical Social Worker

## 2013-06-06 MED ORDER — CLONAZEPAM 1 MG PO TABS: 1.0000 mg | ORAL_TABLET | Freq: Two times a day (BID) | ORAL | Status: DC

## 2013-06-09 ENCOUNTER — Other Ambulatory Visit: Payer: Commercial Managed Care - HMO

## 2013-06-09 ENCOUNTER — Other Ambulatory Visit (INDEPENDENT_AMBULATORY_CARE_PROVIDER_SITE_OTHER): Payer: Commercial Managed Care - HMO

## 2013-06-09 ENCOUNTER — Other Ambulatory Visit: Payer: Self-pay | Admitting: *Deleted

## 2013-06-09 DIAGNOSIS — E1149 Type 2 diabetes mellitus with other diabetic neurological complication: Secondary | ICD-10-CM

## 2013-06-09 DIAGNOSIS — E785 Hyperlipidemia, unspecified: Secondary | ICD-10-CM

## 2013-06-09 LAB — BASIC METABOLIC PANEL
BUN: 10 mg/dL (ref 6–23)
CO2: 29 meq/L (ref 19–32)
Calcium: 8.8 mg/dL (ref 8.4–10.5)
Chloride: 105 mEq/L (ref 96–112)
Creatinine, Ser: 0.9 mg/dL (ref 0.4–1.5)
GFR: 113.88 mL/min (ref >60.00)
GLUCOSE: 122 mg/dL — AB (ref 70–99)
Potassium: 3.7 mEq/L (ref 3.5–5.1)
SODIUM: 139 meq/L (ref 135–145)

## 2013-06-09 LAB — LIPID PANEL
Cholesterol: 96 mg/dL (ref 0–200)
HDL: 37 mg/dL — ABNORMAL LOW (ref >39.00)
LDL Cholesterol: 39 mg/dL (ref 0–99)
Total CHOL/HDL Ratio: 3
Triglycerides: 100 mg/dL (ref 0.0–149.0)
VLDL: 20 mg/dL (ref 0.0–40.0)

## 2013-06-11 LAB — FRUCTOSAMINE: FRUCTOSAMINE: 260 umol/L (ref 190–270)

## 2013-06-12 ENCOUNTER — Encounter: Payer: Self-pay | Admitting: Endocrinology

## 2013-06-12 ENCOUNTER — Ambulatory Visit (INDEPENDENT_AMBULATORY_CARE_PROVIDER_SITE_OTHER): Payer: Commercial Managed Care - HMO | Admitting: Endocrinology

## 2013-06-12 VITALS — BP 110/68 | HR 72 | Temp 97.8°F | Resp 14 | Ht 73.0 in | Wt 180.4 lb

## 2013-06-12 DIAGNOSIS — E1149 Type 2 diabetes mellitus with other diabetic neurological complication: Secondary | ICD-10-CM

## 2013-06-12 DIAGNOSIS — E785 Hyperlipidemia, unspecified: Secondary | ICD-10-CM

## 2013-06-12 MED ORDER — PRAVASTATIN SODIUM 40 MG PO TABS: 40.0000 mg | ORAL_TABLET | Freq: Every evening | ORAL | Status: DC

## 2013-06-12 NOTE — Patient Instructions (Addendum)
Lantus 55 in am and 65 in pm

## 2013-06-12 NOTE — Progress Notes (Signed)
Patient ID: Nicholas Whitehead, male   DOB: 1955/11/03, 58 y.o.   MRN: [id number]   Reason for Appointment: Type II Diabetes follow-up   History of Present Illness   Diagnosis date: 1998    Previous history: He was started on oral agents at diagnosis and around 2006 was put on insulin His blood sugars are usually well controlled when he takes Actos along with his insulin.  Recent history:  He has usually been taking  large doses of insulin especially basal Blood sugars were worse in early 2015 because of depression andnot doing well with his day-to-day management especially diet. Also was irregular with insulin Because of poor control he was started on Invokana in 4/15 and Actos was resumed He also thinks he has been more committed to watching his diet consistently as before With this his blood sugars are significantly improved He has been on his own cutting back on his insulin when he sees his blood sugar is near-normal He has reduced his Lantus by about 20 units twice a day Also taking almost half of his NovoLog especially in the evening Glucose control: His average blood sugar now is down to 133 for the last 4 weeks  Oral hypoglycemic drugs: Actos 30 mg        Side effects from medications: None, tolerating Actos and Invokana Insulin regimen: Lantus 60-65 bid, Novolog 25 AC tid        Proper timing of medications in relation to meals: Yes.          Monitors blood glucose:  2-3 times a day     Glucometer:  Accu-Chek Analysis of download showed the following recent readings  PREMEAL Breakfast Lunch Dinner Bedtime Overall  Glucose range:  100-155   96-148   82-137   99-205   82-218   Mean/median:  131     147   133   Hypoglycemia:  none         Meals: 3 meals per day.          Physical activity: exercise: minimal,is still having pain in his legs           Dietician visit: Most recent: 2013   Weight control:  Wt Readings from Last 3 Encounters:  06/12/13 180 lb 6.4 oz (81.829 kg)   05/21/13 182 lb 6 oz (82.725 kg)  05/05/13 182 lb 6.4 oz (82.736 kg)          Complications:     Diabetes labs:  Lab Results  Component Value Date   HGBA1C 9.2* 05/05/2013   HGBA1C 11.1* 03/25/2013   HGBA1C 6.7* 10/11/2009   Lab Results  Component Value Date   MICROALBUR 0.5 05/05/2013   LDLCALC 39 06/09/2013   CREATININE 0.9 06/09/2013       Medication List       This list is accurate as of: 06/12/13  2:56 PM.  Always use your most recent med list.               aspirin EC 81 MG tablet  Take 81 mg by mouth every morning.     atenolol 50 MG tablet  Commonly known as:  TENORMIN  Take 1 tablet (50 mg total) by mouth daily.     B-D ULTRAFINE III SHORT PEN 31G X 8 MM Misc  Generic drug:  Insulin Pen Needle     Canagliflozin 100 MG Tabs  Commonly known as:  INVOKANA  Take 1 tablet (100 mg total) by mouth daily.  cetirizine 10 MG tablet  Commonly known as:  ZYRTEC  Take 10 mg by mouth daily.     clonazePAM 1 MG tablet  Commonly known as:  KLONOPIN  Take 1-2 tablets (1-2 mg total) by mouth 2 (two) times daily. Take 1 tablet in the morning and 2 tablets at bedtime     COD LIVER OIL PO  Take 1 capsule by mouth every morning.     docusate sodium 100 MG capsule  Commonly known as:  COLACE  Take 100 mg by mouth every morning.     Emtricitab-Rilpivir-Tenofovir 200-25-300 MG Tabs  Take 1 tablet by mouth daily.     gabapentin 100 MG capsule  Commonly known as:  NEURONTIN  Take 1 capsule (100 mg total) by mouth 3 (three) times daily.     HYDROcodone-acetaminophen 5-325 MG per tablet  Commonly known as:  NORCO/VICODIN  Take 1-2 tablets by mouth every 6 (six) hours as needed for moderate pain.     insulin aspart 100 UNIT/ML injection  Commonly known as:  novoLOG  Inject 16-24 Units into the skin 3 (three) times daily with meals.     insulin glargine 100 unit/mL Sopn  Commonly known as:  LANTUS  Inject 60 Units into the skin 2 (two) times daily.     Melatonin 3  MG Tabs  Take by mouth at bedtime.     multivitamin tablet  Take 1 tablet by mouth daily.     pioglitazone 30 MG tablet  Commonly known as:  ACTOS  Take 1 tablet (30 mg total) by mouth daily.     pravastatin 40 MG tablet  Commonly known as:  PRAVACHOL  Take 1 tablet (40 mg total) by mouth every evening.     sertraline 100 MG tablet  Commonly known as:  ZOLOFT  Take 1 tablet (100 mg total) by mouth daily before breakfast.     traZODone 100 MG tablet  Commonly known as:  DESYREL  Take 2 tablets (200 mg total) by mouth at bedtime.        Allergies:  Allergies  Allergen Reactions  . Glipizide Nausea Only    Past Medical History  Diagnosis Date  . HIV infection   . Hypertension   . Diabetes mellitus without complication   . Depression   . Anxiety   . Hypercholesterolemia   . Cataract     right   . DM (diabetes mellitus screen)   . HIV disease 09/12/1995  . Hyperlipidemia     Past Surgical History  Procedure Laterality Date  . Eye surgery  2005    catarct surgery  . Optic lens surgery  2005  . Colonoscopy with propofol  01/16/2012    Procedure: COLONOSCOPY WITH PROPOFOL;  Surgeon: Garlan Fair, MD;  Location: [facility];  Service: Endoscopy;  Laterality: N/A;    Family History  Problem Relation Age of Onset  . Arthritis Mother   . Diabetes Mother   . Heart disease Mother   . Arthritis Father   . Diabetes Father   . Heart disease Father     Social History:  reports that he quit smoking about 17 months ago. His smoking use included Cigarettes. He smoked 0.25 packs per day. He has never used smokeless tobacco. He reports that he drinks alcohol. He reports that he does not use illicit drugs.  Review of Systems:  Hypertension:  currently on atenolol  Lipids: Last LDL was 68 with taking pravastatin  He has symptoms of  neuropathy with paresthesia in feet treated with gabapentin  Depression: Recently well-controlled    LABS:  Appointment on  06/09/2013  Component Date Value Ref Range Status  . Fructosamine 06/09/2013 260  190 - 270 umol/L Final  . Cholesterol 06/09/2013 96  0 - 200 mg/dL Final   ATP III Classification       Desirable:  < 200 mg/dL               Borderline High:  200 - 239 mg/dL          High:  > = 240 mg/dL  . Triglycerides 06/09/2013 100.0  0.0 - 149.0 mg/dL Final   Normal:  <150 mg/dLBorderline High:  150 - 199 mg/dL  . HDL 06/09/2013 37.00* >39.00 mg/dL Final  . VLDL 06/09/2013 20.0  0.0 - 40.0 mg/dL Final  . LDL Cholesterol 06/09/2013 39  0 - 99 mg/dL Final  . Total CHOL/HDL Ratio 06/09/2013 3   Final                  Men          Women1/2 Average Risk     3.4          3.3Average Risk          5.0          4.42X Average Risk          9.6          7.13X Average Risk          15.0          11.0                      . Sodium 06/09/2013 139  135 - 145 mEq/L Final  . Potassium 06/09/2013 3.7  3.5 - 5.1 mEq/L Final  . Chloride 06/09/2013 105  96 - 112 mEq/L Final  . CO2 06/09/2013 29  19 - 32 mEq/L Final  . Glucose, Bld 06/09/2013 122* 70 - 99 mg/dL Final  . BUN 06/09/2013 10  6 - 23 mg/dL Final  . Creatinine, Ser 06/09/2013 0.9  0.4 - 1.5 mg/dL Final  . Calcium 06/09/2013 8.8  8.4 - 10.5 mg/dL Final  . GFR 06/09/2013 113.88  >60.00 mL/min Final     Examination:   BP 110/68  Pulse 72  Temp(Src) 97.8 F (36.6 C)  Resp 14  Ht 6\' 1"  (1.854 m)  Wt 180 lb 6.4 oz (81.829 kg)  BMI 23.81 kg/m2  SpO2 94%  Body mass index is 23.81 kg/(m^2).   No ankle edema  ASSESSMENT/ PLAN:    Diabetes type 2:  Recent blood sugars are significantly better with adding Invokana in improving his diet He has better motivation to be better compliant with day-to-day management of his diabetes as instructed Although he had some fluctuation in his overall blood sugars are excellent and fructosamine is upper normal  He has had insulin resistance despite his normal BMI   In the past has had significant benefits using Actos to  help with insulin resistance and now able to reduce insulin dosage with Invokana and this is being tolerated well  Hypertension: He has stopped his benazepril on his own and blood pressure is still fairly good with atenolol alone   Elayne Snare 06/12/2013, 2:56 PM

## 2013-06-17 ENCOUNTER — Ambulatory Visit: Payer: Medicare HMO

## 2013-06-17 DIAGNOSIS — F331 Major depressive disorder, recurrent, moderate: Secondary | ICD-10-CM

## 2013-06-17 NOTE — Progress Notes (Signed)
Nicholas Whitehead was soft spoken today, reporting that he had "woken up on the wrong side of the bed".  He said he wasn't sure why but that he just felt more "down" today than usual.  I suggested that when this happens, perhaps making a "gratitude list" can help pull [name] out of that negative slump.  I also showed him some of the basics of cognitive behavior therapy - how our thoughts influence our feelings and also gave him a list of cognitive distortions.  He seemed to find this interesting.  He continues to attend a support group at [facility].  Plan to meet in 2 weeks.

## 2013-07-01 ENCOUNTER — Ambulatory Visit: Payer: Commercial Managed Care - HMO

## 2013-07-01 DIAGNOSIS — F330 Major depressive disorder, recurrent, mild: Secondary | ICD-10-CM

## 2013-07-01 NOTE — Progress Notes (Signed)
Nicholas Whitehead was in good spirits today, reporting that he continues to enjoy his support group at the [facility] and that he and 2 or 3 of the other members have become friends.  He said they recently had a cookout at one of their houses and he had a "phenomenal time".  He told me to suggest this group to other clients if they need it.  He also has been busy visiting with some friends from out of town.  Overall, he appears to be very stable at this time.  Plan to meet in 4 weeks.

## 2013-07-22 ENCOUNTER — Other Ambulatory Visit: Payer: Self-pay | Admitting: *Deleted

## 2013-07-22 NOTE — Telephone Encounter (Signed)
Metformin and atenolol rxes faxed back to [facility] to forward to pt's PCP, [facility].

## 2013-07-25 ENCOUNTER — Other Ambulatory Visit: Payer: Self-pay | Admitting: *Deleted

## 2013-07-25 MED ORDER — PIOGLITAZONE HCL 30 MG PO TABS: 30.0000 mg | ORAL_TABLET | Freq: Every day | ORAL | Status: DC

## 2013-07-25 MED ORDER — INSULIN ASPART 100 UNIT/ML ~~LOC~~ SOLN: 16.0000 [IU] | Freq: Three times a day (TID) | SUBCUTANEOUS | Status: DC

## 2013-07-29 ENCOUNTER — Other Ambulatory Visit: Payer: Self-pay | Admitting: Internal Medicine

## 2013-07-30 ENCOUNTER — Other Ambulatory Visit: Payer: Commercial Managed Care - HMO

## 2013-07-30 DIAGNOSIS — B20 Human immunodeficiency virus [HIV] disease: Secondary | ICD-10-CM

## 2013-07-31 LAB — T-HELPER CELL (CD4) - (RCID CLINIC ONLY)
CD4 % Helper T Cell: 33 % (ref 33–55)
CD4 T CELL ABS: 680 /uL (ref 400–2700)

## 2013-08-02 LAB — HIV-1 RNA QUANT-NO REFLEX-BLD
HIV 1 RNA Quant: <20 copies/mL (ref <20)
HIV-1 RNA Quant, Log: <1.3 {Log} (ref <1.30)

## 2013-08-04 ENCOUNTER — Other Ambulatory Visit: Payer: Self-pay | Admitting: Licensed Clinical Social Worker

## 2013-08-04 ENCOUNTER — Ambulatory Visit: Payer: Commercial Managed Care - HMO

## 2013-08-04 MED ORDER — CLONAZEPAM 1 MG PO TABS: 1.0000 mg | ORAL_TABLET | Freq: Two times a day (BID) | ORAL | Status: DC

## 2013-08-12 ENCOUNTER — Ambulatory Visit (INDEPENDENT_AMBULATORY_CARE_PROVIDER_SITE_OTHER): Payer: Medicare HMO | Admitting: Internal Medicine

## 2013-08-12 ENCOUNTER — Encounter: Payer: Self-pay | Admitting: Internal Medicine

## 2013-08-12 VITALS — BP 145/84 | HR 71 | Temp 98.1°F | Wt 186.0 lb

## 2013-08-12 DIAGNOSIS — B20 Human immunodeficiency virus [HIV] disease: Secondary | ICD-10-CM

## 2013-08-12 DIAGNOSIS — Z113 Encounter for screening for infections with a predominantly sexual mode of transmission: Secondary | ICD-10-CM | POA: Insufficient documentation

## 2013-08-12 NOTE — Progress Notes (Signed)
  Subjective:    Patient ID: Nicholas Whitehead, male    DOB: 1955-09-23, 58 y.o.   MRN: [id number]  HPI  He comes in for followup of his HIV. He continues on Complera and reports excellent compliance in taking medication with a high fatty meal. He also continues to suffer from depression though has noted some improvement since stopping Sustiva.  He continues to follow with [facility]. He does continue to take his medication including higher dose of the benzodiazepine.  Manages his dm with Dr. Dwyane Dee and PCP Dr. [name].     Review of Systems  Cardiovascular: Negative for leg swelling.  Gastrointestinal: Negative for nausea.  Musculoskeletal: Negative for back pain.  Skin: Negative for rash.  Neurological: Negative for dizziness, light-headedness and headaches.  Psychiatric/Behavioral: Positive for dysphoric mood. Negative for suicidal ideas.       Objective:   Physical Exam  Constitutional: He appears well-developed and well-nourished. No distress.  HENT:  Mouth/Throat: No oropharyngeal exudate.  Eyes: No scleral icterus.  Cardiovascular: Normal rate, regular rhythm and normal heart sounds.   No murmur heard. Pulmonary/Chest: Effort normal and breath sounds normal. No respiratory distress.  Lymphadenopathy:    He has no cervical adenopathy.  Neurological: He is alert.  Skin: No rash noted.  Psychiatric: His behavior is normal.          Assessment & Plan:

## 2013-08-12 NOTE — Assessment & Plan Note (Signed)
Doing good on current regimen.  RTC 4 months.  Lipids done by PCP.

## 2013-08-14 ENCOUNTER — Ambulatory Visit: Payer: Commercial Managed Care - HMO

## 2013-08-21 ENCOUNTER — Ambulatory Visit (INDEPENDENT_AMBULATORY_CARE_PROVIDER_SITE_OTHER): Payer: Commercial Managed Care - HMO | Admitting: Internal Medicine

## 2013-08-21 ENCOUNTER — Other Ambulatory Visit (INDEPENDENT_AMBULATORY_CARE_PROVIDER_SITE_OTHER): Payer: Commercial Managed Care - HMO

## 2013-08-21 ENCOUNTER — Encounter: Payer: Self-pay | Admitting: Internal Medicine

## 2013-08-21 VITALS — BP 140/80 | HR 64 | Temp 97.9°F | Resp 16 | Ht 73.0 in | Wt 182.0 lb

## 2013-08-21 DIAGNOSIS — E1149 Type 2 diabetes mellitus with other diabetic neurological complication: Secondary | ICD-10-CM

## 2013-08-21 DIAGNOSIS — L602 Onychogryphosis: Secondary | ICD-10-CM | POA: Insufficient documentation

## 2013-08-21 DIAGNOSIS — I10 Essential (primary) hypertension: Secondary | ICD-10-CM

## 2013-08-21 DIAGNOSIS — L608 Other nail disorders: Secondary | ICD-10-CM

## 2013-08-21 LAB — HEMOGLOBIN A1C: Hgb A1c MFr Bld: 6.8 % — ABNORMAL HIGH (ref 4.6–6.5)

## 2013-08-21 LAB — BASIC METABOLIC PANEL
BUN: 10 mg/dL (ref 6–23)
CALCIUM: 9.1 mg/dL (ref 8.4–10.5)
CO2: 29 meq/L (ref 19–32)
Chloride: 106 mEq/L (ref 96–112)
Creatinine, Ser: 0.9 mg/dL (ref 0.4–1.5)
GFR: 108.17 mL/min (ref >60.00)
Glucose, Bld: 135 mg/dL — ABNORMAL HIGH (ref 70–99)
Potassium: 3.8 mEq/L (ref 3.5–5.1)
SODIUM: 141 meq/L (ref 135–145)

## 2013-08-21 NOTE — Progress Notes (Signed)
Pre visit review using our clinic review tool, if applicable. No additional management support is needed unless otherwise documented below in the visit note. 

## 2013-08-21 NOTE — Progress Notes (Signed)
Subjective:    Patient ID: Nicholas Whitehead, male    DOB: [birth year], 58 y.o.   MRN: [id number]  Diabetes He presents for his follow-up diabetic visit. He has type 2 diabetes mellitus. His disease course has been stable. There are no hypoglycemic associated symptoms. Pertinent negatives for hypoglycemia include no dizziness, headaches or tremors. Pertinent negatives for diabetes include no blurred vision, no chest pain, no fatigue, no foot paresthesias, no foot ulcerations, no polydipsia, no polyphagia, no polyuria, no visual change, no weakness and no weight loss. There are no hypoglycemic complications. There are no diabetic complications. Current diabetic treatment includes oral agent (dual therapy) and insulin injections. His weight is stable. He is following a generally healthy diet. Meal planning includes avoidance of concentrated sweets. He never participates in exercise. There is no change in his home blood glucose trend. An ACE inhibitor/angiotensin II receptor blocker is not being taken. He sees a podiatrist.Eye exam is current.      Review of Systems  Constitutional: Negative.  Negative for fever, chills, weight loss, diaphoresis, appetite change and fatigue.  HENT: Negative.   Eyes: Negative.  Negative for blurred vision.  Respiratory: Negative.  Negative for cough, choking, chest tightness, shortness of breath and stridor.   Cardiovascular: Negative.  Negative for chest pain, palpitations and leg swelling.  Gastrointestinal: Negative.  Negative for nausea, vomiting, abdominal pain, diarrhea, constipation and blood in stool.  Endocrine: Negative.  Negative for polydipsia, polyphagia and polyuria.  Genitourinary: Negative.   Musculoskeletal: Negative.  Negative for arthralgias, back pain, myalgias and neck pain.  Skin: Negative.  Negative for rash.  Allergic/Immunologic: Negative.   Neurological: Negative.  Negative for dizziness, tremors, syncope, weakness, light-headedness,  numbness and headaches.  Hematological: Negative.  Negative for adenopathy. Does not bruise/bleed easily.  Psychiatric/Behavioral: Negative.        Objective:   Physical Exam  Vitals reviewed. Constitutional: He is oriented to person, place, and time. He appears well-developed and well-nourished. No distress.  HENT:  Head: Normocephalic and atraumatic.  Mouth/Throat: Oropharynx is clear and moist. No oropharyngeal exudate.  Eyes: Conjunctivae are normal. Right eye exhibits no discharge. Left eye exhibits no discharge. No scleral icterus.  Neck: Normal range of motion. Neck supple. No JVD present. No tracheal deviation present. No thyromegaly present.  Cardiovascular: Normal rate, regular rhythm, normal heart sounds and intact distal pulses.  Exam reveals no gallop and no friction rub.   No murmur heard. Pulmonary/Chest: Effort normal and breath sounds normal. No stridor. No respiratory distress. He has no wheezes. He has no rales. He exhibits no tenderness.  Abdominal: Soft. Bowel sounds are normal. He exhibits no distension and no mass. There is no tenderness. There is no rebound and no guarding.  Musculoskeletal: Normal range of motion. He exhibits no edema and no tenderness.  Lymphadenopathy:    He has no cervical adenopathy.  Neurological: He is oriented to person, place, and time.  Skin: Skin is warm and dry. No rash noted. He is not diaphoretic. No erythema. No pallor.     Lab Results  Component Value Date   WBC 9.6 02/24/2013   HGB 15.9 02/24/2013   HCT 46.7 02/24/2013   PLT 128* 02/24/2013   GLUCOSE 122* 06/09/2013   CHOL 96 06/09/2013   TRIG 100.0 06/09/2013   HDL 37.00* 06/09/2013   LDLCALC 39 06/09/2013   ALT 31 02/24/2013   AST 33 02/24/2013   NA 139 06/09/2013   K 3.7 06/09/2013   CL 105 06/09/2013  CREATININE 0.9 06/09/2013   BUN 10 06/09/2013   CO2 29 06/09/2013   TSH 0.87 03/25/2013   PSA 0.71 05/21/2013   HGBA1C 9.2* 05/05/2013   MICROALBUR 0.5 05/05/2013       Assessment &  Plan:

## 2013-08-21 NOTE — Patient Instructions (Signed)

## 2013-08-23 NOTE — Assessment & Plan Note (Signed)
His BP is well controlled His lytes and renal function are stable 

## 2013-08-23 NOTE — Assessment & Plan Note (Signed)
His blood sugars are well controlled His renal function is stable 

## 2013-08-26 ENCOUNTER — Ambulatory Visit: Payer: Commercial Managed Care - HMO

## 2013-09-04 ENCOUNTER — Telehealth: Payer: Self-pay | Admitting: *Deleted

## 2013-09-04 ENCOUNTER — Other Ambulatory Visit: Payer: Self-pay | Admitting: Internal Medicine

## 2013-09-04 DIAGNOSIS — F419 Anxiety disorder, unspecified: Principal | ICD-10-CM

## 2013-09-04 DIAGNOSIS — G629 Polyneuropathy, unspecified: Secondary | ICD-10-CM

## 2013-09-04 DIAGNOSIS — F32A Depression, unspecified: Secondary | ICD-10-CM

## 2013-09-04 DIAGNOSIS — I10 Essential (primary) hypertension: Secondary | ICD-10-CM

## 2013-09-04 DIAGNOSIS — F329 Major depressive disorder, single episode, unspecified: Secondary | ICD-10-CM

## 2013-09-04 DIAGNOSIS — G4700 Insomnia, unspecified: Secondary | ICD-10-CM

## 2013-09-04 DIAGNOSIS — B20 Human immunodeficiency virus [HIV] disease: Secondary | ICD-10-CM

## 2013-09-04 DIAGNOSIS — Z23 Encounter for immunization: Secondary | ICD-10-CM

## 2013-09-04 MED ORDER — SERTRALINE HCL 100 MG PO TABS: 100.0000 mg | ORAL_TABLET | Freq: Every day | ORAL | Status: DC

## 2013-09-04 MED ORDER — TRAZODONE HCL 100 MG PO TABS: 200.0000 mg | ORAL_TABLET | Freq: Every day | ORAL | Status: DC

## 2013-09-04 NOTE — Telephone Encounter (Signed)
I don't see any indication the PCP is.  We can refill it with 5 refills. thanks

## 2013-09-04 NOTE — Telephone Encounter (Signed)
Received incoming fax requesting refills on trazodone 100mg  and sertraline 100mg .  Please advise if patient's PCP is now maintaining these medications. Landis Gandy, RN

## 2013-09-05 NOTE — Telephone Encounter (Signed)
Refilled by Dr. [name].  Thank you!

## 2013-09-08 ENCOUNTER — Other Ambulatory Visit (INDEPENDENT_AMBULATORY_CARE_PROVIDER_SITE_OTHER): Payer: Commercial Managed Care - HMO

## 2013-09-08 ENCOUNTER — Other Ambulatory Visit: Payer: Commercial Managed Care - HMO

## 2013-09-08 DIAGNOSIS — E785 Hyperlipidemia, unspecified: Secondary | ICD-10-CM

## 2013-09-08 DIAGNOSIS — E1149 Type 2 diabetes mellitus with other diabetic neurological complication: Secondary | ICD-10-CM

## 2013-09-08 LAB — LIPID PANEL
CHOLESTEROL: 104 mg/dL (ref 0–200)
HDL: 43.3 mg/dL (ref >39.00)
LDL Cholesterol: 50 mg/dL (ref 0–99)
NonHDL: 60.7
TRIGLYCERIDES: 53 mg/dL (ref 0.0–149.0)
Total CHOL/HDL Ratio: 2
VLDL: 10.6 mg/dL (ref 0.0–40.0)

## 2013-09-08 LAB — COMPREHENSIVE METABOLIC PANEL
ALT: 21 U/L (ref 0–53)
AST: 26 U/L (ref 0–37)
Albumin: 3.9 g/dL (ref 3.5–5.2)
Alkaline Phosphatase: 45 U/L (ref 39–117)
BUN: 7 mg/dL (ref 6–23)
CO2: 27 meq/L (ref 19–32)
Calcium: 8.9 mg/dL (ref 8.4–10.5)
Chloride: 107 mEq/L (ref 96–112)
Creatinine, Ser: 0.8 mg/dL (ref 0.4–1.5)
GFR: 134.48 mL/min (ref >60.00)
GLUCOSE: 104 mg/dL — AB (ref 70–99)
Potassium: 4 mEq/L (ref 3.5–5.1)
Sodium: 141 mEq/L (ref 135–145)
Total Bilirubin: 0.5 mg/dL (ref 0.2–1.2)
Total Protein: 8.1 g/dL (ref 6.0–8.3)

## 2013-09-08 LAB — MICROALBUMIN / CREATININE URINE RATIO
Creatinine,U: 129.2 mg/dL
MICROALB UR: 7 mg/dL — AB (ref 0.0–1.9)
Microalb Creat Ratio: 5.4 mg/g (ref 0.0–30.0)

## 2013-09-08 LAB — HEMOGLOBIN A1C: Hgb A1c MFr Bld: 6.7 % — ABNORMAL HIGH (ref 4.6–6.5)

## 2013-09-11 ENCOUNTER — Ambulatory Visit (INDEPENDENT_AMBULATORY_CARE_PROVIDER_SITE_OTHER): Payer: Commercial Managed Care - HMO | Admitting: Endocrinology

## 2013-09-11 ENCOUNTER — Encounter: Payer: Self-pay | Admitting: Endocrinology

## 2013-09-11 VITALS — BP 162/84 | HR 64 | Temp 98.1°F | Resp 16 | Ht 73.0 in | Wt 184.4 lb

## 2013-09-11 DIAGNOSIS — E1149 Type 2 diabetes mellitus with other diabetic neurological complication: Secondary | ICD-10-CM

## 2013-09-11 DIAGNOSIS — I10 Essential (primary) hypertension: Secondary | ICD-10-CM

## 2013-09-11 DIAGNOSIS — Z23 Encounter for immunization: Secondary | ICD-10-CM

## 2013-09-11 DIAGNOSIS — G609 Hereditary and idiopathic neuropathy, unspecified: Secondary | ICD-10-CM

## 2013-09-11 MED ORDER — GABAPENTIN 300 MG PO CAPS: 300.0000 mg | ORAL_CAPSULE | Freq: Three times a day (TID) | ORAL | Status: DC

## 2013-09-11 NOTE — Patient Instructions (Signed)
Please check blood sugars at least half the time about 2 hours after any meal and times per week on waking up. Please bring blood sugar monitor to each visit  Lantus dose depends on trend of glucose before Bfst and supper  Novolog dose depends on meal size and Carbs not sugar level

## 2013-09-11 NOTE — Progress Notes (Signed)
Patient ID: Nicholas Whitehead, male   DOB: [birth year], 58 y.o.   MRN: [id number]   Reason for Appointment: Type II Diabetes follow-up   History of Present Illness   Diagnosis date: 1998    Previous history: He was started on oral agents at diagnosis and around 2006 was put on insulin His blood sugars are usually well controlled when he takes Actos along with his insulin.  Recent history:  He has usually been taking  large doses of insulin especially his Lantus Blood sugars were worse in early 2015 because of noncompliance secondary to depression Because of poor control he was started on Invokana in 4/15 and Actos was resumed He has also been more compliant with taking his insulin and watching his diet With this his blood sugars are significantly improved and A1c is below 7 recently He has been on his own cutting back on his Lantus insulin and has reduced it by about 10 units recently However he is still taking about 25 units of NovoLog at meals and not adjusting dose based on meal size He is however checking his blood sugar mostly around lunchtime and sometimes right after eating Also has occasional bedtime readings which are good Fasting blood sugar but never checked appears to be fairly good No hypoglycemia Has been able to maintain his weight even with improving blood sugars and is tolerating Actos and Invokana without side effects  Oral hypoglycemic drugs: Actos 30 mg        Side effects from medications: None Insulin regimen: Lantus 50-55 bid, Novolog  25 AC tid        Proper timing of medications in relation to meals: Yes.          Monitors blood glucose:  2-3 times a day     Glucometer:  Accu-Chek Analysis of download showed the following recent readings  PREMEAL Breakfast Lunch  PCL  Bedtime Overall  Glucose range:  101-147   114-154   147- 185   97-175    Mean/median:     135   Hypoglycemia:  none although he feels a little weaker when blood sugar is close to 100          Meals: 3 meals per day.  usually avoiding high-fat meals         Physical activity: exercise: some walking,is still having pain in his legs           Dietician visit: Most recent: 2013   Weight control:  Wt Readings from Last 3 Encounters:  09/11/13 184 lb 6.4 oz (83.643 kg)  08/21/13 182 lb (82.555 kg)  08/12/13 186 lb (84.369 kg)          Complications:     Diabetes labs:  Lab Results  Component Value Date   HGBA1C 6.7* 09/08/2013   HGBA1C 6.8* 08/21/2013   HGBA1C 9.2* 05/05/2013   Lab Results  Component Value Date   MICROALBUR 7.0* 09/08/2013   LDLCALC 50 09/08/2013   CREATININE 0.8 09/08/2013       Medication List       This list is accurate as of: 09/11/13  3:05 PM.  Always use your most recent med list.               aspirin EC 81 MG tablet  Take 81 mg by mouth every morning.     atenolol 50 MG tablet  Commonly known as:  TENORMIN  take 1 tablet by mouth once daily     B-D  ULTRAFINE III SHORT PEN 31G X 8 MM Misc  Generic drug:  Insulin Pen Needle  use as directed     Canagliflozin 100 MG Tabs  Commonly known as:  INVOKANA  Take 1 tablet (100 mg total) by mouth daily.     cetirizine 10 MG tablet  Commonly known as:  ZYRTEC  Take 10 mg by mouth daily.     clonazePAM 1 MG tablet  Commonly known as:  KLONOPIN  Take 1-2 tablets (1-2 mg total) by mouth 2 (two) times daily. Take 1 tablet in the morning and 2 tablets at bedtime     COD LIVER OIL PO  Take 1 capsule by mouth every morning.     docusate sodium 100 MG capsule  Commonly known as:  COLACE  Take 100 mg by mouth every morning.     Emtricitab-Rilpivir-Tenofovir 200-25-300 MG Tabs  Take 1 tablet by mouth daily.     gabapentin 100 MG capsule  Commonly known as:  NEURONTIN  Take 1 capsule (100 mg total) by mouth 3 (three) times daily.     insulin aspart 100 UNIT/ML injection  Commonly known as:  novoLOG  Inject 16-24 Units into the skin 3 (three) times daily with meals.     insulin glargine  100 unit/mL Sopn  Commonly known as:  LANTUS  Inject 60 Units into the skin 2 (two) times daily.     Melatonin 3 MG Tabs  Take by mouth at bedtime.     multivitamin tablet  Take 1 tablet by mouth daily.     pioglitazone 30 MG tablet  Commonly known as:  ACTOS  Take 1 tablet (30 mg total) by mouth daily.     pravastatin 40 MG tablet  Commonly known as:  PRAVACHOL  Take 1 tablet (40 mg total) by mouth every evening.     sertraline 100 MG tablet  Commonly known as:  ZOLOFT  Take 1 tablet (100 mg total) by mouth daily before breakfast.     traZODone 100 MG tablet  Commonly known as:  DESYREL  Take 2 tablets (200 mg total) by mouth at bedtime.        Allergies:  Allergies  Allergen Reactions  . Glipizide Nausea Only    Past Medical History  Diagnosis Date  . HIV infection   . Hypertension   . Diabetes mellitus without complication   . Depression   . Anxiety   . Hypercholesterolemia   . Cataract     right   . DM (diabetes mellitus screen)   . HIV disease 09/12/1995  . Hyperlipidemia     Past Surgical History  Procedure Laterality Date  . Eye surgery  2005    catarct surgery  . Optic lens surgery  2005  . Colonoscopy with propofol  01/16/2012    Procedure: COLONOSCOPY WITH PROPOFOL;  Surgeon: Garlan Fair, MD;  Location: [facility];  Service: Endoscopy;  Laterality: N/A;    Family History  Problem Relation Age of Onset  . Arthritis Mother   . Diabetes Mother   . Heart disease Mother   . Arthritis Father   . Diabetes Father   . Heart disease Father     Social History:  reports that he quit smoking about 20 months ago. His smoking use included Cigarettes. He smoked 0.25 packs per day. He has never used smokeless tobacco. He reports that he does not drink alcohol or use illicit drugs.  Review of Systems:  Hypertension:  currently on  atenolol, blood pressure is higher because of stress. Blood pressure was fairly good with PCP recently  He was  previously on benazepril which he stopped on his own  Lipids: LDL is controlled with taking pravastatin  Lab Results  Component Value Date   CHOL 104 09/08/2013   HDL 43.30 09/08/2013   LDLCALC 50 09/08/2013   TRIG 53.0 09/08/2013   CHOLHDL 2 09/08/2013    He has symptoms of neuropathy with paresthesia in feet and legs treated with 100 mg of gabapentin, 3 daily. However he does not feel his symptoms are controlled and is also having pain on walking  Depression: Recently well-controlled, continues to take Zoloft    LABS:  Appointment on 09/08/2013  Component Date Value Ref Range Status  . Hemoglobin A1C 09/08/2013 6.7* 4.6 - 6.5 % Final   Glycemic Control Guidelines for People with Diabetes:Non Diabetic:  <6%Goal of Therapy: <7%Additional Action Suggested:  >8%   . Sodium 09/08/2013 141  135 - 145 mEq/L Final  . Potassium 09/08/2013 4.0  3.5 - 5.1 mEq/L Final  . Chloride 09/08/2013 107  96 - 112 mEq/L Final  . CO2 09/08/2013 27  19 - 32 mEq/L Final  . Glucose, Bld 09/08/2013 104* 70 - 99 mg/dL Final  . BUN 09/08/2013 7  6 - 23 mg/dL Final  . Creatinine, Ser 09/08/2013 0.8  0.4 - 1.5 mg/dL Final  . Total Bilirubin 09/08/2013 0.5  0.2 - 1.2 mg/dL Final  . Alkaline Phosphatase 09/08/2013 45  39 - 117 U/L Final  . AST 09/08/2013 26  0 - 37 U/L Final  . ALT 09/08/2013 21  0 - 53 U/L Final  . Total Protein 09/08/2013 8.1  6.0 - 8.3 g/dL Final  . Albumin 09/08/2013 3.9  3.5 - 5.2 g/dL Final  . Calcium 09/08/2013 8.9  8.4 - 10.5 mg/dL Final  . GFR 09/08/2013 134.48  >60.00 mL/min Final  . Cholesterol 09/08/2013 104  0 - 200 mg/dL Final   ATP III Classification       Desirable:  < 200 mg/dL               Borderline High:  200 - 239 mg/dL          High:  > = 240 mg/dL  . Triglycerides 09/08/2013 53.0  0.0 - 149.0 mg/dL Final   Normal:  <150 mg/dLBorderline High:  150 - 199 mg/dL  . HDL 09/08/2013 43.30  >39.00 mg/dL Final  . VLDL 09/08/2013 10.6  0.0 - 40.0 mg/dL Final  . LDL Cholesterol  09/08/2013 50  0 - 99 mg/dL Final  . Total CHOL/HDL Ratio 09/08/2013 2   Final                  Men          Women1/2 Average Risk     3.4          3.3Average Risk          5.0          4.42X Average Risk          9.6          7.13X Average Risk          15.0          11.0                      . NonHDL 09/08/2013 60.70   Final   NOTE:  Non-HDL  goal should be 30 mg/dL higher than patient's LDL goal (i.e. LDL goal of < 70 mg/dL, would have non-HDL goal of < 100 mg/dL)  . Microalb, Ur 09/08/2013 7.0* 0.0 - 1.9 mg/dL Final  . Creatinine,U 09/08/2013 129.2   Final  . Microalb Creat Ratio 09/08/2013 5.4  0.0 - 30.0 mg/g Final     Examination:   BP 162/84  Pulse 64  Temp(Src) 98.1 F (36.7 C)  Resp 16  Ht 6\' 1"  (1.854 m)  Wt 184 lb 6.4 oz (83.643 kg)  BMI 24.33 kg/m2  SpO2 95%  Body mass index is 24.33 kg/(m^2).   Has a normal affect  No lower leg edema  ASSESSMENT/ PLAN:    Diabetes type 2:  Recent blood sugars are overall looking better with good compliance with his insulin, diet and trying to walk a little Also has benefited from using Actos which had helped his insulin resistant previously; also probably better with adding Invokana since he is reduced his insulin dose especially basal Discussed timing of glucose monitoring and she is checking blood sugars mostly around lunchtime Also discussed that any stool adjust his mealtime insulin based on his meal size and carbohydrate He should not adjust his Lantus arbitrarily but discussed adjusting it based on blood sugars before breakfast and supper on a weekly basis Currently does not wish to see a dietitian again  NEUROPATHY: He is not getting relief of symptoms. Taking only small doses of gabapentin and has no side effects. Discussed increasing the dose at least 300 mg up to 3-4 times a day for symptomatic relief  Hypertension: Blood pressure is high normal and may consider restarting benazepril even though his microalbumin ratio is  normal  Counseling time over 50% of today's 25 minute visit   Tinaya Ceballos 09/11/2013, 3:05 PM

## 2013-09-23 ENCOUNTER — Ambulatory Visit: Payer: Commercial Managed Care - HMO

## 2013-10-02 ENCOUNTER — Ambulatory Visit: Payer: Commercial Managed Care - HMO | Admitting: Podiatry

## 2013-10-07 ENCOUNTER — Ambulatory Visit: Payer: Commercial Managed Care - HMO

## 2013-10-15 ENCOUNTER — Other Ambulatory Visit: Payer: Self-pay | Admitting: *Deleted

## 2013-10-15 MED ORDER — INSULIN PEN NEEDLE 31G X 8 MM MISC: Status: DC

## 2013-10-15 NOTE — Telephone Encounter (Signed)
Left msg on triage requesting refills on his pen needles. Called pt back inform him will send to [facility]...Nicholas Whitehead

## 2013-10-16 ENCOUNTER — Ambulatory Visit: Payer: Commercial Managed Care - HMO | Admitting: Podiatry

## 2013-10-16 ENCOUNTER — Ambulatory Visit (INDEPENDENT_AMBULATORY_CARE_PROVIDER_SITE_OTHER): Payer: Commercial Managed Care - HMO | Admitting: Podiatry

## 2013-10-16 ENCOUNTER — Encounter: Payer: Self-pay | Admitting: Podiatry

## 2013-10-16 DIAGNOSIS — E1151 Type 2 diabetes mellitus with diabetic peripheral angiopathy without gangrene: Secondary | ICD-10-CM

## 2013-10-16 DIAGNOSIS — B351 Tinea unguium: Secondary | ICD-10-CM

## 2013-10-16 DIAGNOSIS — M79673 Pain in unspecified foot: Secondary | ICD-10-CM

## 2013-10-16 DIAGNOSIS — Q828 Other specified congenital malformations of skin: Secondary | ICD-10-CM

## 2013-10-16 NOTE — Progress Notes (Signed)
Subjective:     Patient ID: Nicholas Whitehead, male   DOB: [birth year], 58 y.o.   MRN: [id number]  HPI patient presents with severely thickened nailbeds 1-5 both feet that are painful and painful lesions plantar aspect heel of both feet that make it hard for him to walk   Review of Systems     Objective:   Physical Exam Neurovascular status diminished but intact with thick yellow brittle nailbeds 1-5 both feet that are painful and painful keratotic lesions plantar aspect heel of both feet    Assessment:     At risk diabetic with lesion formation and nail disease 1-5 both feet that are painful    Plan:     Debride painful nailbeds 1-5 both feet and lesions on both feet with no iatrogenic bleeding noted

## 2013-10-16 NOTE — Progress Notes (Signed)
   Subjective:    Patient ID: Nicholas Whitehead, male    DOB: Nov 29, 1955, 58 y.o.   MRN: [id number]  HPI Nail debridement   Review of Systems     Objective:   Physical Exam        Assessment & Plan:

## 2013-10-20 ENCOUNTER — Telehealth: Payer: Self-pay

## 2013-10-20 MED ORDER — CLONAZEPAM 1 MG PO TABS: 1.0000 mg | ORAL_TABLET | Freq: Two times a day (BID) | ORAL | Status: DC

## 2013-10-20 NOTE — Telephone Encounter (Signed)
done

## 2013-10-20 NOTE — Telephone Encounter (Signed)
Received refill request from [place] request refills for clonazepam 1mg  . Rx last written7/27/15 and pt last seen 08/21/13  . Please advise Thanks

## 2013-10-21 ENCOUNTER — Ambulatory Visit: Payer: Commercial Managed Care - HMO

## 2013-11-04 ENCOUNTER — Ambulatory Visit: Payer: Commercial Managed Care - HMO

## 2013-11-17 ENCOUNTER — Other Ambulatory Visit: Payer: Self-pay | Admitting: *Deleted

## 2013-11-17 DIAGNOSIS — IMO0002 Reserved for concepts with insufficient information to code with codable children: Secondary | ICD-10-CM

## 2013-11-17 DIAGNOSIS — E1165 Type 2 diabetes mellitus with hyperglycemia: Secondary | ICD-10-CM

## 2013-11-17 MED ORDER — PIOGLITAZONE HCL 30 MG PO TABS: 30.0000 mg | ORAL_TABLET | Freq: Every day | ORAL | Status: DC

## 2013-11-17 MED ORDER — PRAVASTATIN SODIUM 40 MG PO TABS: 40.0000 mg | ORAL_TABLET | Freq: Every evening | ORAL | Status: DC

## 2013-11-20 ENCOUNTER — Ambulatory Visit: Payer: Commercial Managed Care - HMO

## 2013-11-28 ENCOUNTER — Other Ambulatory Visit (INDEPENDENT_AMBULATORY_CARE_PROVIDER_SITE_OTHER): Payer: Commercial Managed Care - HMO

## 2013-11-28 ENCOUNTER — Other Ambulatory Visit: Payer: Commercial Managed Care - HMO

## 2013-11-28 DIAGNOSIS — E1149 Type 2 diabetes mellitus with other diabetic neurological complication: Secondary | ICD-10-CM

## 2013-11-28 DIAGNOSIS — IMO0002 Reserved for concepts with insufficient information to code with codable children: Secondary | ICD-10-CM

## 2013-11-28 DIAGNOSIS — E1165 Type 2 diabetes mellitus with hyperglycemia: Principal | ICD-10-CM

## 2013-11-28 LAB — COMPREHENSIVE METABOLIC PANEL
ALBUMIN: 4.1 g/dL (ref 3.5–5.2)
ALT: 31 U/L (ref 0–53)
AST: 40 U/L — ABNORMAL HIGH (ref 0–37)
Alkaline Phosphatase: 60 U/L (ref 39–117)
BUN: 13 mg/dL (ref 6–23)
CALCIUM: 8.9 mg/dL (ref 8.4–10.5)
CHLORIDE: 104 meq/L (ref 96–112)
CO2: 26 meq/L (ref 19–32)
Creatinine, Ser: 1 mg/dL (ref 0.4–1.5)
GFR: 101.73 mL/min (ref >60.00)
GLUCOSE: 159 mg/dL — AB (ref 70–99)
POTASSIUM: 3.9 meq/L (ref 3.5–5.1)
SODIUM: 138 meq/L (ref 135–145)
TOTAL PROTEIN: 8.1 g/dL (ref 6.0–8.3)
Total Bilirubin: 0.7 mg/dL (ref 0.2–1.2)

## 2013-11-28 LAB — HEMOGLOBIN A1C: Hgb A1c MFr Bld: 6.9 % — ABNORMAL HIGH (ref 4.6–6.5)

## 2013-12-01 ENCOUNTER — Other Ambulatory Visit: Payer: Self-pay | Admitting: *Deleted

## 2013-12-01 MED ORDER — ATENOLOL 50 MG PO TABS: 50.0000 mg | ORAL_TABLET | Freq: Every day | ORAL | Status: DC

## 2013-12-08 ENCOUNTER — Other Ambulatory Visit: Payer: Commercial Managed Care - HMO

## 2013-12-09 ENCOUNTER — Telehealth: Payer: Self-pay

## 2013-12-09 NOTE — Telephone Encounter (Signed)
rx refill for Clonazepam denied.   Rx was written for #90 with 3 refills on 10.12.15.   Indicated to pharmacy may have had that rx filled at another pharmacy.   Will need to hear from pt for more information.

## 2013-12-11 ENCOUNTER — Ambulatory Visit (INDEPENDENT_AMBULATORY_CARE_PROVIDER_SITE_OTHER): Payer: Commercial Managed Care - HMO | Admitting: Endocrinology

## 2013-12-11 ENCOUNTER — Ambulatory Visit: Payer: Commercial Managed Care - HMO

## 2013-12-11 ENCOUNTER — Encounter: Payer: Self-pay | Admitting: Endocrinology

## 2013-12-11 ENCOUNTER — Other Ambulatory Visit (INDEPENDENT_AMBULATORY_CARE_PROVIDER_SITE_OTHER): Payer: Commercial Managed Care - HMO

## 2013-12-11 VITALS — BP 122/70 | HR 64 | Temp 98.0°F | Resp 14 | Ht 73.0 in | Wt 185.2 lb

## 2013-12-11 DIAGNOSIS — I10 Essential (primary) hypertension: Secondary | ICD-10-CM

## 2013-12-11 DIAGNOSIS — Z23 Encounter for immunization: Secondary | ICD-10-CM

## 2013-12-11 DIAGNOSIS — G629 Polyneuropathy, unspecified: Secondary | ICD-10-CM

## 2013-12-11 DIAGNOSIS — F32A Depression, unspecified: Secondary | ICD-10-CM

## 2013-12-11 DIAGNOSIS — B20 Human immunodeficiency virus [HIV] disease: Secondary | ICD-10-CM

## 2013-12-11 DIAGNOSIS — G4700 Insomnia, unspecified: Secondary | ICD-10-CM

## 2013-12-11 DIAGNOSIS — E119 Type 2 diabetes mellitus without complications: Secondary | ICD-10-CM

## 2013-12-11 DIAGNOSIS — F329 Major depressive disorder, single episode, unspecified: Secondary | ICD-10-CM

## 2013-12-11 LAB — CBC WITH DIFFERENTIAL/PLATELET
Basophils Absolute: 0 10*3/uL (ref 0.0–0.1)
Basophils Relative: 0 % (ref 0–1)
EOS ABS: 0.1 10*3/uL (ref 0.0–0.7)
EOS PCT: 1 % (ref 0–5)
HCT: 46.1 % (ref 39.0–52.0)
Hemoglobin: 15.6 g/dL (ref 13.0–17.0)
LYMPHS ABS: 2.2 10*3/uL (ref 0.7–4.0)
Lymphocytes Relative: 28 % (ref 12–46)
MCH: 32.6 pg (ref 26.0–34.0)
MCHC: 33.8 g/dL (ref 30.0–36.0)
MCV: 96.4 fL (ref 78.0–100.0)
MPV: 10 fL (ref 9.4–12.4)
Monocytes Absolute: 0.6 10*3/uL (ref 0.1–1.0)
Monocytes Relative: 8 % (ref 3–12)
NEUTROS PCT: 63 % (ref 43–77)
Neutro Abs: 5 10*3/uL (ref 1.7–7.7)
Platelets: 113 10*3/uL — ABNORMAL LOW (ref 150–400)
RBC: 4.78 MIL/uL (ref 4.22–5.81)
RDW: 15.2 % (ref 11.5–15.5)
WBC: 8 10*3/uL (ref 4.0–10.5)

## 2013-12-11 LAB — COMPLETE METABOLIC PANEL WITH GFR
ALT: 30 U/L (ref 0–53)
AST: 34 U/L (ref 0–37)
Albumin: 3.8 g/dL (ref 3.5–5.2)
Alkaline Phosphatase: 48 U/L (ref 39–117)
BUN: 10 mg/dL (ref 6–23)
CO2: 29 meq/L (ref 19–32)
CREATININE: 0.78 mg/dL (ref 0.50–1.35)
Calcium: 9.1 mg/dL (ref 8.4–10.5)
Chloride: 106 mEq/L (ref 96–112)
GLUCOSE: 130 mg/dL — AB (ref 70–99)
Potassium: 4.4 mEq/L (ref 3.5–5.3)
Sodium: 142 mEq/L (ref 135–145)
Total Bilirubin: 0.3 mg/dL (ref 0.2–1.2)
Total Protein: 7.5 g/dL (ref 6.0–8.3)

## 2013-12-11 NOTE — Patient Instructions (Signed)
May stop Invokana  Monitor BP

## 2013-12-11 NOTE — Progress Notes (Signed)
Patient ID: Nicholas Whitehead, male   DOB: August 27, 1955, 58 y.o.   MRN: [id number]   Reason for Appointment: Type II Diabetes follow-up   History of Present Illness   Diagnosis date: 1998    Previous history: He was started on oral agents at diagnosis and around 2006 was put on insulin His blood sugars are usually well controlled when he takes Actos along with his insulin.  Recent history:  He has usually been requiring large doses of insulin especially  Lantus Blood sugars were worse in early 2015 because of noncompliance secondary to depression Because of poor control he was started on Invokana in 4/15 and Actos was resumed He continues to be compliant with taking his insulin and watching his diet With this his blood sugars are significantly improved and A1c is below 7 consistently  He has been on his own cutting back on his Lantus insulin and has reduced it by about 5 units since his last visit He thinks he is adjusting his Humalog based on his meal size, between 20-25 units Did not bring his monitor for download and not clear what his blood sugar patterns are A1c is fairly good although relatively higher His fasting glucose was higher in the lab but he thinks it is usually not high at home No hypoglycemia Has been having some issues with nocturia and urgency and he thinks this is since starting Invokana  Oral hypoglycemic drugs: Actos 30 mg, Invokana       Side effects from medications: None Insulin regimen: Lantus 50  bid, Novolog  20-25 AC tid        Proper timing of medications in relation to meals: Yes.          Monitors blood glucose:  2-3 times a day     Glucometer:  Accu-Chek Readings by recall:  PRE-MEAL Breakfast Lunch Dinner Bedtime Overall  Glucose range: 110   120-140   Mean/median:     ?     Hypoglycemia:  none although he feels a little weak when blood sugar is close to 100         Meals: 3 meals per day.  usually avoiding high-fat meals         Physical activity:  exercise: more walking, having pain in his legs at times           Dietician visit: Most recent: 2013   Weight control:  Wt Readings from Last 3 Encounters:  12/11/13 185 lb 3.2 oz (84.006 kg)  09/11/13 184 lb 6.4 oz (83.643 kg)  08/21/13 182 lb (82.555 kg)          Complications:     Diabetes labs:  Lab Results  Component Value Date   HGBA1C 6.9* 11/28/2013   HGBA1C 6.7* 09/08/2013   HGBA1C 6.8* 08/21/2013   Lab Results  Component Value Date   MICROALBUR 7.0* 09/08/2013   LDLCALC 50 09/08/2013   CREATININE 1.0 11/28/2013       Medication List       This list is accurate as of: 12/11/13 10:07 AM.  Always use your most recent med list.               aspirin EC 81 MG tablet  Take 81 mg by mouth every morning.     atenolol 50 MG tablet  Commonly known as:  TENORMIN  Take 1 tablet (50 mg total) by mouth daily.     canagliflozin 100 MG Tabs tablet  Commonly known as:  INVOKANA  Take 1 tablet (100 mg total) by mouth daily.     cetirizine 10 MG tablet  Commonly known as:  ZYRTEC  Take 10 mg by mouth daily.     clonazePAM 1 MG tablet  Commonly known as:  KLONOPIN  Take 1-2 tablets (1-2 mg total) by mouth 2 (two) times daily. Take 1 tablet in the morning and 2 tablets at bedtime     COD LIVER OIL PO  Take 1 capsule by mouth every morning.     docusate sodium 100 MG capsule  Commonly known as:  COLACE  Take 100 mg by mouth every morning.     Emtricitab-Rilpivir-Tenofovir 200-25-300 MG Tabs  Take 1 tablet by mouth daily.     gabapentin 300 MG capsule  Commonly known as:  NEURONTIN  Take 1 capsule (300 mg total) by mouth 3 (three) times daily.     insulin aspart 100 UNIT/ML injection  Commonly known as:  novoLOG  Inject 16-24 Units into the skin 3 (three) times daily with meals.     insulin glargine 100 unit/mL Sopn  Commonly known as:  LANTUS  Inject 60 Units into the skin 2 (two) times daily.     Insulin Pen Needle 31G X 8 MM Misc  Commonly known  as:  B-D ULTRAFINE III SHORT PEN  - Use to administer insulin   - Dx 250.00     Melatonin 3 MG Tabs  Take by mouth at bedtime.     multivitamin tablet  Take 1 tablet by mouth daily.     pioglitazone 30 MG tablet  Commonly known as:  ACTOS  Take 1 tablet (30 mg total) by mouth daily.     pravastatin 40 MG tablet  Commonly known as:  PRAVACHOL  Take 1 tablet (40 mg total) by mouth every evening.     sertraline 100 MG tablet  Commonly known as:  ZOLOFT  Take 1 tablet (100 mg total) by mouth daily before breakfast.     traZODone 100 MG tablet  Commonly known as:  DESYREL  Take 2 tablets (200 mg total) by mouth at bedtime.        Allergies:  Allergies  Allergen Reactions  . Glipizide Nausea Only    Past Medical History  Diagnosis Date  . HIV infection   . Hypertension   . Diabetes mellitus without complication   . Depression   . Anxiety   . Hypercholesterolemia   . Cataract     right   . DM (diabetes mellitus screen)   . HIV disease 09/12/1995  . Hyperlipidemia     Past Surgical History  Procedure Laterality Date  . Eye surgery  2005    catarct surgery  . Optic lens surgery  2005  . Colonoscopy with propofol  01/16/2012    Procedure: COLONOSCOPY WITH PROPOFOL;  Surgeon: Garlan Fair, MD;  Location: [facility];  Service: Endoscopy;  Laterality: N/A;    Family History  Problem Relation Age of Onset  . Arthritis Mother   . Diabetes Mother   . Heart disease Mother   . Arthritis Father   . Diabetes Father   . Heart disease Father     Social History:  reports that he quit smoking about 23 months ago. His smoking use included Cigarettes. He smoked 0.25 packs per day. He has never used smokeless tobacco. He reports that he does not drink alcohol or use illicit drugs.  Review of Systems:  Hypertension:  currently on  atenolol only   Lipids: LDL is controlled with taking pravastatin  Lab Results  Component Value Date   CHOL 104 09/08/2013   HDL 43.30  09/08/2013   LDLCALC 50 09/08/2013   TRIG 53.0 09/08/2013   CHOLHDL 2 09/08/2013    He has symptoms of neuropathy with paresthesia in feet and legs treated with 300 mg of gabapentin, 3 daily.   Depression: Recently well-controlled, continues to take Zoloft    LABS:  No visits with results within 1 Week(s) from this visit. Latest known visit with results is:  Appointment on 11/28/2013  Component Date Value Ref Range Status  . Hgb A1c MFr Bld 11/28/2013 6.9* 4.6 - 6.5 % Final   Glycemic Control Guidelines for People with Diabetes:Non Diabetic:  <6%Goal of Therapy: <7%Additional Action Suggested:  >8%   . Sodium 11/28/2013 138  135 - 145 mEq/L Final  . Potassium 11/28/2013 3.9  3.5 - 5.1 mEq/L Final  . Chloride 11/28/2013 104  96 - 112 mEq/L Final  . CO2 11/28/2013 26  19 - 32 mEq/L Final  . Glucose, Bld 11/28/2013 159* 70 - 99 mg/dL Final  . BUN 11/28/2013 13  6 - 23 mg/dL Final  . Creatinine, Ser 11/28/2013 1.0  0.4 - 1.5 mg/dL Final  . Total Bilirubin 11/28/2013 0.7  0.2 - 1.2 mg/dL Final  . Alkaline Phosphatase 11/28/2013 60  39 - 117 U/L Final  . AST 11/28/2013 40* 0 - 37 U/L Final  . ALT 11/28/2013 31  0 - 53 U/L Final  . Total Protein 11/28/2013 8.1  6.0 - 8.3 g/dL Final  . Albumin 11/28/2013 4.1  3.5 - 5.2 g/dL Final  . Calcium 11/28/2013 8.9  8.4 - 10.5 mg/dL Final  . GFR 11/28/2013 101.73  >60.00 mL/min Final     Examination:   BP 122/70 mmHg  Pulse 64  Temp(Src) 98 F (36.7 C)  Resp 14  Ht 6\' 1"  (1.854 m)  Wt 185 lb 3.2 oz (84.006 kg)  BMI 24.44 kg/m2  SpO2 95%  Body mass index is 24.44 kg/(m^2).      ASSESSMENT/ PLAN:    Diabetes type 2:  Recent blood sugars are overall fairly well controlled with A1c again below 7% He still has good compliance with his insulin, diet and is now trying to walk a little more Also has benefited from using Actos which had helped his insulin resistant previously However he thinks he is getting frequent urination especially  at night with urge incontinence and he thinks this is from [place] He can try to stop the Baker for a week and see if it makes a difference Since his blood sugar patterns were not reviewed in detail will not make any changes in his insulin; he reports fairly good blood sugars at various times  NEUROPATHY: He is  getting relief of symptoms somewhat better with increasing the gabapentin to 300 mg  Hypertension: Blood pressure is  normal and may consider restarting benazepril if blood pressure goes up with stopping Invokana    Karyn Brull 12/11/2013, 10:07 AM

## 2013-12-12 LAB — HIV-1 RNA QUANT-NO REFLEX-BLD

## 2013-12-12 LAB — T-HELPER CELL (CD4) - (RCID CLINIC ONLY)
CD4 T CELL HELPER: 35 % (ref 33–55)
CD4 T Cell Abs: 840 /uL (ref 400–2700)

## 2013-12-17 ENCOUNTER — Ambulatory Visit: Payer: Commercial Managed Care - HMO

## 2013-12-24 ENCOUNTER — Other Ambulatory Visit: Payer: Self-pay | Admitting: *Deleted

## 2013-12-24 ENCOUNTER — Telehealth: Payer: Self-pay | Admitting: Internal Medicine

## 2013-12-24 DIAGNOSIS — F329 Major depressive disorder, single episode, unspecified: Secondary | ICD-10-CM

## 2013-12-24 DIAGNOSIS — F32A Depression, unspecified: Secondary | ICD-10-CM

## 2013-12-24 DIAGNOSIS — F419 Anxiety disorder, unspecified: Secondary | ICD-10-CM

## 2013-12-24 MED ORDER — CLONAZEPAM 1 MG PO TABS: 1.0000 mg | ORAL_TABLET | Freq: Two times a day (BID) | ORAL | Status: DC

## 2013-12-24 MED ORDER — SERTRALINE HCL 100 MG PO TABS: 100.0000 mg | ORAL_TABLET | Freq: Every day | ORAL | Status: DC

## 2013-12-24 NOTE — Telephone Encounter (Signed)
Patient called back.  He is not out of zoloft.  He is out of klonopin.

## 2013-12-24 NOTE — Telephone Encounter (Signed)
Pt called and requesting refill of Zoloft as he is out, he has appt with MD on 01/21/14.

## 2013-12-24 NOTE — Telephone Encounter (Signed)
done

## 2013-12-25 ENCOUNTER — Ambulatory Visit: Payer: Commercial Managed Care - HMO

## 2013-12-25 ENCOUNTER — Encounter: Payer: Self-pay | Admitting: Internal Medicine

## 2013-12-25 ENCOUNTER — Ambulatory Visit (INDEPENDENT_AMBULATORY_CARE_PROVIDER_SITE_OTHER): Payer: Commercial Managed Care - HMO | Admitting: Internal Medicine

## 2013-12-25 VITALS — BP 153/82 | HR 82 | Temp 97.7°F | Ht 73.0 in | Wt 182.0 lb

## 2013-12-25 DIAGNOSIS — B20 Human immunodeficiency virus [HIV] disease: Secondary | ICD-10-CM

## 2013-12-25 DIAGNOSIS — Z113 Encounter for screening for infections with a predominantly sexual mode of transmission: Secondary | ICD-10-CM

## 2013-12-29 NOTE — Progress Notes (Signed)
  Subjective:    Patient ID: Nicholas Whitehead, male    DOB: [birth year], 58 y.o.   MRN: [id number]  HPI He comes in for followup of his HIV. He continues on Complera and reports excellent compliance in taking medication with a high fatty meal.  He continues to follow with Gray[facility] Ormond. He does continue to take his medication including higher dose of the benzodiazepine.  Manages his dm with Dr. Dwyane Dee and PCP Dr. [name].  CD4 840, viral load remains undetectable.    Review of Systems  Cardiovascular: Negative for leg swelling.  Gastrointestinal: Negative for nausea.  Musculoskeletal: Negative for back pain.  Skin: Negative for rash.  Neurological: Negative for dizziness, light-headedness and headaches.  Psychiatric/Behavioral: Positive for dysphoric mood. Negative for suicidal ideas.       Objective:   Physical Exam  Constitutional: He appears well-developed and well-nourished.  HENT:  Mouth/Throat: No oropharyngeal exudate.  Eyes: No scleral icterus.  Cardiovascular: Normal rate, regular rhythm and normal heart sounds.   No murmur heard. Pulmonary/Chest: Effort normal and breath sounds normal. No respiratory distress.  Lymphadenopathy:    He has no cervical adenopathy.  Skin: No rash noted.          Assessment & Plan:

## 2013-12-29 NOTE — Assessment & Plan Note (Signed)
Doing well.  RTC 4-5 months.

## 2014-01-06 LAB — HM DIABETES EYE EXAM

## 2014-01-07 ENCOUNTER — Ambulatory Visit: Payer: Commercial Managed Care - HMO

## 2014-01-12 ENCOUNTER — Other Ambulatory Visit: Payer: Self-pay | Admitting: *Deleted

## 2014-01-12 MED ORDER — INSULIN PEN NEEDLE 31G X 8 MM MISC: Status: DC

## 2014-01-14 ENCOUNTER — Other Ambulatory Visit: Payer: Self-pay | Admitting: *Deleted

## 2014-01-14 MED ORDER — ACCU-CHEK FASTCLIX LANCETS MISC: Status: DC

## 2014-01-14 MED ORDER — GLUCOSE BLOOD VI STRP: ORAL_STRIP | Status: DC

## 2014-01-19 ENCOUNTER — Ambulatory Visit (INDEPENDENT_AMBULATORY_CARE_PROVIDER_SITE_OTHER): Payer: Commercial Managed Care - HMO | Admitting: Podiatry

## 2014-01-19 DIAGNOSIS — B351 Tinea unguium: Secondary | ICD-10-CM

## 2014-01-19 DIAGNOSIS — Q828 Other specified congenital malformations of skin: Secondary | ICD-10-CM | POA: Diagnosis not present

## 2014-01-19 DIAGNOSIS — M79673 Pain in unspecified foot: Secondary | ICD-10-CM | POA: Diagnosis not present

## 2014-01-19 DIAGNOSIS — E1151 Type 2 diabetes mellitus with diabetic peripheral angiopathy without gangrene: Secondary | ICD-10-CM | POA: Diagnosis not present

## 2014-01-19 NOTE — Progress Notes (Signed)
Subjective:     Patient ID: Nicholas Whitehead, male   DOB: [birth year], 59 y.o.   MRN: [id number]  HPI patient presents with severely thickened nailbeds 1-5 both feet that are painful and painful lesions plantar aspect heel of both feet that make it hard for him to walk   Review of Systems     Objective:   Physical Exam Neurovascular status diminished but intact with thick yellow brittle nailbeds 1-5 both feet that are painful and painful keratotic lesions plantar aspect heel of both feet    Assessment:     At risk diabetic with lesion formation and nail disease 1-5 both feet that are painful    Plan:     Debride painful nailbeds 1-5 both feet and lesions on both feet with no iatrogenic bleeding noted

## 2014-01-21 ENCOUNTER — Encounter: Payer: Self-pay | Admitting: Internal Medicine

## 2014-01-21 ENCOUNTER — Ambulatory Visit: Payer: Commercial Managed Care - HMO

## 2014-01-21 ENCOUNTER — Ambulatory Visit (INDEPENDENT_AMBULATORY_CARE_PROVIDER_SITE_OTHER): Payer: Commercial Managed Care - HMO | Admitting: Internal Medicine

## 2014-01-21 VITALS — BP 122/78 | HR 68 | Temp 98.2°F | Resp 16 | Ht 73.0 in | Wt 186.0 lb

## 2014-01-21 DIAGNOSIS — I10 Essential (primary) hypertension: Secondary | ICD-10-CM

## 2014-01-21 DIAGNOSIS — Z23 Encounter for immunization: Secondary | ICD-10-CM

## 2014-01-21 DIAGNOSIS — E1140 Type 2 diabetes mellitus with diabetic neuropathy, unspecified: Secondary | ICD-10-CM

## 2014-01-21 NOTE — Patient Instructions (Signed)

## 2014-01-21 NOTE — Progress Notes (Signed)
   Subjective:    Patient ID: Nicholas Whitehead, male    DOB: 1955/10/23, 59 y.o.   MRN: [id number]  Diabetes He presents for his follow-up diabetic visit. He has type 2 diabetes mellitus. His disease course has been stable. There are no hypoglycemic associated symptoms. Associated symptoms include foot paresthesias. Pertinent negatives for diabetes include no blurred vision, no chest pain, no fatigue, no foot ulcerations, no polydipsia, no polyphagia, no polyuria, no visual change, no weakness and no weight loss. There are no hypoglycemic complications. Diabetic complications include peripheral neuropathy. Current diabetic treatment includes oral agent (dual therapy) and insulin injections. He is compliant with treatment all of the time. He is following a generally healthy diet. Meal planning includes avoidance of concentrated sweets. He participates in exercise intermittently. There is no change in his home blood glucose trend. An ACE inhibitor/angiotensin II receptor blocker is not being taken. He does not see a podiatrist.Eye exam is current.      Review of Systems  Constitutional: Negative.  Negative for weight loss and fatigue.  HENT: Negative.   Eyes: Negative.  Negative for blurred vision.  Respiratory: Negative.   Cardiovascular: Negative.  Negative for chest pain, palpitations and leg swelling.  Gastrointestinal: Negative.  Negative for abdominal pain.  Endocrine: Negative.  Negative for polydipsia, polyphagia and polyuria.  Genitourinary: Negative.   Musculoskeletal: Negative.   Skin: Negative.   Allergic/Immunologic: Negative.   Neurological: Negative.  Negative for weakness.  Hematological: Negative.   Psychiatric/Behavioral: Negative.        Objective:   Physical Exam  Constitutional: He is oriented to person, place, and time. He appears well-developed and well-nourished. No distress.  HENT:  Head: Normocephalic and atraumatic.  Mouth/Throat: Oropharynx is clear and moist.  No oropharyngeal exudate.  Eyes: Conjunctivae are normal. Right eye exhibits no discharge. Left eye exhibits no discharge. No scleral icterus.  Neck: Normal range of motion. Neck supple. No JVD present. No tracheal deviation present. No thyromegaly present.  Cardiovascular: Normal rate, regular rhythm, normal heart sounds and intact distal pulses.  Exam reveals no gallop and no friction rub.   No murmur heard. Pulmonary/Chest: Effort normal and breath sounds normal. No stridor. No respiratory distress. He has no wheezes. He has no rales. He exhibits no tenderness.  Abdominal: Soft. Bowel sounds are normal. He exhibits no distension and no mass. There is no tenderness. There is no rebound and no guarding.  Musculoskeletal: Normal range of motion. He exhibits no edema or tenderness.  Lymphadenopathy:    He has no cervical adenopathy.  Neurological: He is oriented to person, place, and time.  Skin: Skin is warm and dry. No rash noted. He is not diaphoretic. No erythema. No pallor.  Vitals reviewed.     Lab Results  Component Value Date   WBC 8.0 12/11/2013   HGB 15.6 12/11/2013   HCT 46.1 12/11/2013   PLT 113* 12/11/2013   GLUCOSE 130* 12/11/2013   CHOL 104 09/08/2013   TRIG 53.0 09/08/2013   HDL 43.30 09/08/2013   LDLCALC 50 09/08/2013   ALT 30 12/11/2013   AST 34 12/11/2013   NA 142 12/11/2013   K 4.4 12/11/2013   CL 106 12/11/2013   CREATININE 0.78 12/11/2013   BUN 10 12/11/2013   CO2 29 12/11/2013   TSH 0.87 03/25/2013   PSA 0.71 05/21/2013   HGBA1C 6.9* 11/28/2013   MICROALBUR 7.0* 09/08/2013      Assessment & Plan:

## 2014-01-22 ENCOUNTER — Encounter: Payer: Self-pay | Admitting: Internal Medicine

## 2014-01-22 ENCOUNTER — Other Ambulatory Visit: Payer: Commercial Managed Care - HMO

## 2014-01-22 NOTE — Assessment & Plan Note (Signed)
His BP is well controlled 

## 2014-01-22 NOTE — Assessment & Plan Note (Signed)
His recent A1C shows excellent blood sugar control Will cont the current regimen

## 2014-02-05 ENCOUNTER — Ambulatory Visit: Payer: Commercial Managed Care - HMO

## 2014-02-05 ENCOUNTER — Telehealth: Payer: Self-pay | Admitting: Internal Medicine

## 2014-02-05 MED ORDER — CLONAZEPAM 1 MG PO TABS: 1.0000 mg | ORAL_TABLET | Freq: Two times a day (BID) | ORAL | Status: DC

## 2014-02-05 NOTE — Telephone Encounter (Signed)
done

## 2014-02-05 NOTE — Telephone Encounter (Signed)
Pharmacy called in and said that pt came in to request refill on his   clonazePAM (KLONOPIN) 1 MG tablet [[id number]]

## 2014-02-19 ENCOUNTER — Ambulatory Visit: Payer: Commercial Managed Care - HMO

## 2014-02-19 DIAGNOSIS — F331 Major depressive disorder, recurrent, moderate: Secondary | ICD-10-CM

## 2014-02-19 NOTE — BH Specialist Note (Signed)
Nicholas Whitehead was very quiet when he first arrived and said his depression has been taking its toll on him.  As the session progressed he was able to laugh and smile some.  He talked about his ongoing frustration with his sister living with him and having her grandchildren (4 and 3) staying at his apartment part of every day.  Then he shared that he has a new love interest - a 57 year old - who he says is very nice and is a Theme park manager.  Overall, he seems to be stable and his sharp wit is still intact.  Plan to meet in 2 weeks. Nicholas Spice, LCSW  [place]: [phone]

## 2014-02-23 ENCOUNTER — Other Ambulatory Visit: Payer: Self-pay | Admitting: *Deleted

## 2014-02-23 MED ORDER — INSULIN ASPART 100 UNIT/ML ~~LOC~~ SOLN: 20.0000 [IU] | Freq: Three times a day (TID) | SUBCUTANEOUS | Status: DC

## 2014-03-05 ENCOUNTER — Ambulatory Visit: Payer: Commercial Managed Care - HMO

## 2014-03-05 DIAGNOSIS — F331 Major depressive disorder, recurrent, moderate: Secondary | ICD-10-CM
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
## 2014-03-12 ENCOUNTER — Ambulatory Visit (INDEPENDENT_AMBULATORY_CARE_PROVIDER_SITE_OTHER): Payer: Commercial Managed Care - HMO | Admitting: Endocrinology

## 2014-03-12 ENCOUNTER — Encounter: Payer: Self-pay | Admitting: Endocrinology

## 2014-03-12 VITALS — BP 148/72 | HR 61 | Temp 97.8°F | Resp 16 | Ht 73.0 in | Wt 178.2 lb

## 2014-03-12 DIAGNOSIS — E1140 Type 2 diabetes mellitus with diabetic neuropathy, unspecified: Secondary | ICD-10-CM

## 2014-03-12 DIAGNOSIS — E119 Type 2 diabetes mellitus without complications: Secondary | ICD-10-CM

## 2014-03-12 LAB — HEMOGLOBIN A1C: Hgb A1c MFr Bld: 8.2 % — ABNORMAL HIGH (ref 4.6–6.5)

## 2014-03-12 LAB — BASIC METABOLIC PANEL
BUN: 10 mg/dL (ref 6–23)
CO2: 30 mEq/L (ref 19–32)
CREATININE: 0.93 mg/dL (ref 0.40–1.50)
Calcium: 9.3 mg/dL (ref 8.4–10.5)
Chloride: 104 mEq/L (ref 96–112)
GFR: 107.95 mL/min (ref >60.00)
GLUCOSE: 262 mg/dL — AB (ref 70–99)
Potassium: 4.2 mEq/L (ref 3.5–5.1)
Sodium: 138 mEq/L (ref 135–145)

## 2014-03-12 LAB — GLUCOSE, POCT (MANUAL RESULT ENTRY): POC GLUCOSE: 264 mg/dL — AB (ref 70–99)

## 2014-03-12 NOTE — Progress Notes (Signed)
Patient ID: Nicholas Whitehead, male   DOB: Jun 02, 1955, 59 y.o.   MRN: [id number]   Reason for Appointment: Type II Diabetes follow-up   History of Present Illness   Diagnosis date: 1998    Previous history: He was started on oral agents at diagnosis and around 2006 was put on insulin His blood sugars are usually well controlled when he takes Actos along with his insulin.  Recent history:  He takes a regimen of basal bolus insulin He has usually been requiring large doses of insulin especially  Lantus Because of poor control he was started on Invokana in 4/15 and Actos was resumed With this his blood sugars are significantly improved and A1c is below 7 consistently  However on his visit in 12/15 because of complaints of frequent urination and urgency his Invokana was stopped as a trial; he thinks that with stopping the medication he did not have any further frequency and incontinence  Recently because of various stress issues and personal factors he has not been consistent with his regimen He will sometimes forget his Lantus including last night He has not had a functioning glucose monitor for the last 2 months He has not changed his Lantus dose He thinks he is adjusting his Humalog based on his meal size, between 20-25 units; however sometimes may skip a meal because of decreased appetite  Oral hypoglycemic drugs: Actos 30 mg, not Invokana       Side effects from medications: None Insulin regimen: Lantus 50  bid, Novolog  20-25 AC tid        Proper timing of medications in relation to meals: Yes.          Monitors blood glucose: none for the last 2 months   Glucometer:  Accu-Chek  Hypoglycemia:  none although he feels a little weak when blood sugar is close to 100         Meals: 3 meals per day.  usually avoiding high-fat meals         Physical activity: exercise: more walking, having pain in his legs at times           Dietician visit: Most recent: 2013   Weight control:  Wt  Readings from Last 3 Encounters:  03/12/14 178 lb 3.2 oz (80.831 kg)  01/21/14 186 lb (84.369 kg)  12/25/13 182 lb (82.555 kg)          Complications:     Diabetes labs:  Lab Results  Component Value Date   HGBA1C 6.9* 11/28/2013   HGBA1C 6.7* 09/08/2013   HGBA1C 6.8* 08/21/2013   Lab Results  Component Value Date   MICROALBUR 7.0* 09/08/2013   LDLCALC 50 09/08/2013   CREATININE 0.78 12/11/2013       Medication List       This list is accurate as of: 03/12/14 10:41 AM.  Always use your most recent med list.               ACCU-CHEK FASTCLIX LANCETS Misc  Use to check blood sugar 3 times per day dx code E11.49     aspirin EC 81 MG tablet  Take 81 mg by mouth every morning.     atenolol 50 MG tablet  Commonly known as:  TENORMIN  Take 1 tablet (50 mg total) by mouth daily.     benazepril 20 MG tablet  Commonly known as:  LOTENSIN     cetirizine 10 MG tablet  Commonly known as:  ZYRTEC  Take 10  mg by mouth daily.     clonazePAM 1 MG tablet  Commonly known as:  KLONOPIN  Take 1-2 tablets (1-2 mg total) by mouth 2 (two) times daily. Take 1 tablet in the morning and 2 tablets at bedtime     COD LIVER OIL PO  Take 1 capsule by mouth every morning.     docusate sodium 100 MG capsule  Commonly known as:  COLACE  Take 100 mg by mouth every morning.     Emtricitab-Rilpivir-Tenofovir 200-25-300 MG Tabs  Take 1 tablet by mouth daily.     gabapentin 300 MG capsule  Commonly known as:  NEURONTIN  Take 1 capsule (300 mg total) by mouth 3 (three) times daily.     glucose blood test strip  Commonly known as:  ACCU-CHEK SMARTVIEW  Use as instructed to check blood sugar 3 times per day dx code E11.49     insulin aspart 100 UNIT/ML injection  Commonly known as:  novoLOG  Inject 20-25 Units into the skin 3 (three) times daily with meals.     insulin glargine 100 unit/mL Sopn  Commonly known as:  LANTUS  Inject 60 Units into the skin 2 (two) times daily.      Insulin Pen Needle 31G X 8 MM Misc  Commonly known as:  B-D ULTRAFINE III SHORT PEN  Use to administer insulin Dx E11.9     Melatonin 3 MG Tabs  Take by mouth at bedtime.     multivitamin tablet  Take 1 tablet by mouth daily.     pioglitazone 30 MG tablet  Commonly known as:  ACTOS  Take 1 tablet (30 mg total) by mouth daily.     pravastatin 40 MG tablet  Commonly known as:  PRAVACHOL  Take 1 tablet (40 mg total) by mouth every evening.     sertraline 100 MG tablet  Commonly known as:  ZOLOFT  Take 1 tablet (100 mg total) by mouth daily before breakfast.     traZODone 100 MG tablet  Commonly known as:  DESYREL  Take 2 tablets (200 mg total) by mouth at bedtime.        Allergies:  Allergies  Allergen Reactions  . Glipizide Nausea Only    Past Medical History  Diagnosis Date  . HIV infection   . Hypertension   . Diabetes mellitus without complication   . Depression   . Anxiety   . Hypercholesterolemia   . Cataract     right   . DM (diabetes mellitus screen)   . HIV disease 09/12/1995  . Hyperlipidemia     Past Surgical History  Procedure Laterality Date  . Eye surgery  2005    catarct surgery  . Optic lens surgery  2005  . Colonoscopy with propofol  01/16/2012    Procedure: COLONOSCOPY WITH PROPOFOL;  Surgeon: Garlan Fair, MD;  Location: [facility];  Service: Endoscopy;  Laterality: N/A;    Family History  Problem Relation Age of Onset  . Arthritis Mother   . Diabetes Mother   . Heart disease Mother   . Arthritis Father   . Diabetes Father   . Heart disease Father     Social History:  reports that he quit smoking about 2 years ago. His smoking use included Cigarettes. He smoked 0.25 packs per day. He has never used smokeless tobacco. He reports that he does not drink alcohol or use illicit drugs.  Review of Systems:  Hypertension:  currently on atenolol and  back on benazepril, he thinks blood pressure may be higher today from stress  Lipids:  LDL is controlled with taking pravastatin  Lab Results  Component Value Date   CHOL 104 09/08/2013   HDL 43.30 09/08/2013   LDLCALC 50 09/08/2013   TRIG 53.0 09/08/2013   CHOLHDL 2 09/08/2013    He has symptoms of neuropathy with paresthesia in feet and legs and symptoms are well controlled with 300 mg of gabapentin, 3 daily.   Depression: Not well-controlled, continues to take Zoloft    LABS:  Office Visit on 03/12/2014  Component Date Value Ref Range Status  . POC Glucose 03/12/2014 264* 70 - 99 mg/dl Final     Examination:   BP 162/84 mmHg  Pulse 61  Temp(Src) 97.8 F (36.6 C)  Resp 16  Ht 6\' 1"  (1.854 m)  Wt 178 lb 3.2 oz (80.831 kg)  BMI 23.52 kg/m2  SpO2 96%  Body mass index is 23.52 kg/(m^2).      ASSESSMENT/ PLAN:    Diabetes type 2:  Recent blood sugars are difficult to assess as he has not checked his blood sugars at home and A1c not available He has not been consistently compliant with his Lantus and his glucose is high today  For now will not change his insulin regimen except making him take his Lantus consistently right at suppertime for better compliance New glucose monitor given Continue Actos Follow-up in 2 months for review of blood sugar control and patterns Patient instructions as follows   Hypertension: Blood pressure is relatively higher, he will follow-up with PCP  Patient Instructions  Take Lantus at supper  Please check blood sugars at least half the time about 2 hours after any meal and 3 times per week on waking up. Please bring blood sugar monitor to each visit. Recommended blood sugar levels about 2 hours after meal is 140-180 and on waking up 90-130 Call if staying higher than target  Walk daily       Cyann Venti 03/12/2014, 10:41 AM

## 2014-03-12 NOTE — Patient Instructions (Signed)
Take Lantus at supper  Please check blood sugars at least half the time about 2 hours after any meal and 3 times per week on waking up. Please bring blood sugar monitor to each visit. Recommended blood sugar levels about 2 hours after meal is 140-180 and on waking up 90-130 Call if staying higher than target  Walk daily

## 2014-03-15 NOTE — Progress Notes (Signed)
Quick Note:  Please let patient know that the A1c as high as expected at 8.2 and glucose 262  ______

## 2014-03-16 ENCOUNTER — Other Ambulatory Visit: Payer: Self-pay | Admitting: *Deleted

## 2014-03-16 DIAGNOSIS — I10 Essential (primary) hypertension: Secondary | ICD-10-CM

## 2014-03-16 DIAGNOSIS — B20 Human immunodeficiency virus [HIV] disease: Secondary | ICD-10-CM

## 2014-03-16 DIAGNOSIS — F329 Major depressive disorder, single episode, unspecified: Secondary | ICD-10-CM

## 2014-03-16 DIAGNOSIS — F32A Depression, unspecified: Secondary | ICD-10-CM

## 2014-03-16 DIAGNOSIS — G4700 Insomnia, unspecified: Secondary | ICD-10-CM

## 2014-03-16 DIAGNOSIS — Z23 Encounter for immunization: Secondary | ICD-10-CM

## 2014-03-16 DIAGNOSIS — G629 Polyneuropathy, unspecified: Secondary | ICD-10-CM

## 2014-03-16 MED ORDER — PIOGLITAZONE HCL 30 MG PO TABS: 30.0000 mg | ORAL_TABLET | Freq: Every day | ORAL | Status: DC

## 2014-03-16 MED ORDER — EMTRICITAB-RILPIVIR-TENOFOV DF 200-25-300 MG PO TABS: 1.0000 | ORAL_TABLET | Freq: Every day | ORAL | Status: DC

## 2014-03-17 ENCOUNTER — Other Ambulatory Visit: Payer: Self-pay

## 2014-03-17 MED ORDER — ATENOLOL 50 MG PO TABS: 50.0000 mg | ORAL_TABLET | Freq: Every day | ORAL | Status: DC

## 2014-03-19 ENCOUNTER — Ambulatory Visit: Payer: Commercial Managed Care - HMO

## 2014-03-19 DIAGNOSIS — F331 Major depressive disorder, recurrent, moderate: Secondary | ICD-10-CM
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
## 2014-03-24 ENCOUNTER — Telehealth: Payer: Self-pay

## 2014-03-24 MED ORDER — CLONAZEPAM 1 MG PO TABS: 1.0000 mg | ORAL_TABLET | Freq: Two times a day (BID) | ORAL | Status: DC

## 2014-03-24 NOTE — Addendum Note (Signed)
Addended by: [name] on: 03/24/2014 03:57 PM   Modules accepted: Orders

## 2014-03-24 NOTE — Telephone Encounter (Signed)
Received refill request from [facility]  request refills for clonazepam 1 mg . Rx last written 02/05/14 #30/3rf and pt last seen 02/05/14. Please advise Thanks

## 2014-03-24 NOTE — Telephone Encounter (Signed)
done

## 2014-03-27 ENCOUNTER — Other Ambulatory Visit: Payer: Self-pay | Admitting: *Deleted

## 2014-03-27 DIAGNOSIS — E1140 Type 2 diabetes mellitus with diabetic neuropathy, unspecified: Secondary | ICD-10-CM

## 2014-03-27 MED ORDER — GABAPENTIN 300 MG PO CAPS: 300.0000 mg | ORAL_CAPSULE | Freq: Three times a day (TID) | ORAL | Status: DC

## 2014-04-02 ENCOUNTER — Ambulatory Visit: Payer: Commercial Managed Care - HMO

## 2014-04-02 DIAGNOSIS — F330 Major depressive disorder, recurrent, mild: Secondary | ICD-10-CM
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
## 2014-04-13 ENCOUNTER — Other Ambulatory Visit: Payer: Self-pay | Admitting: *Deleted

## 2014-04-13 DIAGNOSIS — E1165 Type 2 diabetes mellitus with hyperglycemia: Secondary | ICD-10-CM

## 2014-04-13 DIAGNOSIS — IMO0002 Reserved for concepts with insufficient information to code with codable children: Secondary | ICD-10-CM

## 2014-04-13 MED ORDER — PRAVASTATIN SODIUM 40 MG PO TABS: 40.0000 mg | ORAL_TABLET | Freq: Every evening | ORAL | Status: DC

## 2014-04-16 ENCOUNTER — Ambulatory Visit: Payer: Commercial Managed Care - HMO

## 2014-04-16 DIAGNOSIS — F32A Depression, unspecified: Secondary | ICD-10-CM

## 2014-04-16 DIAGNOSIS — F329 Major depressive disorder, single episode, unspecified: Secondary | ICD-10-CM

## 2014-04-16 NOTE — BH Specialist Note (Signed)
Nicholas Whitehead was in a down mood today, reporting that his blood/sugar levels have been fluctuating some lately.  He also expressed ongoing frustration with his living situation, which involves his sister staying with him and her grandchildren, 28 and 17, spending time at his apartment.  He said they are not well disciplined and it frustrates him.  I talked with him about setting some limits with his sister and/or his niece about allowing the children to be at his place, but he feels very trapped and obligated to "help family".  Plan to meet again in 2 weeks. Curley Spice, LCSW   [place]odas: [phone]

## 2014-04-23 ENCOUNTER — Ambulatory Visit: Payer: Commercial Managed Care - HMO

## 2014-04-27 ENCOUNTER — Ambulatory Visit: Payer: Commercial Managed Care - HMO

## 2014-04-28 ENCOUNTER — Telehealth: Payer: Self-pay

## 2014-04-28 MED ORDER — INSULIN PEN NEEDLE 31G X 8 MM MISC: Status: DC

## 2014-04-28 NOTE — Telephone Encounter (Signed)
Refill request for short pen needle 31gx72mm

## 2014-04-29 ENCOUNTER — Other Ambulatory Visit: Payer: Commercial Managed Care - HMO

## 2014-04-29 ENCOUNTER — Other Ambulatory Visit: Payer: Self-pay

## 2014-04-29 ENCOUNTER — Ambulatory Visit: Payer: Commercial Managed Care - HMO

## 2014-04-29 ENCOUNTER — Other Ambulatory Visit (HOSPITAL_COMMUNITY)
Admission: RE | Admit: 2014-04-29 | Discharge: 2014-04-29 | Disposition: A | Discharge status: Home / Self Care | Payer: Commercial Managed Care - HMO | Source: Ambulatory Visit | Attending: Internal Medicine | Admitting: Internal Medicine

## 2014-04-29 DIAGNOSIS — B20 Human immunodeficiency virus [HIV] disease: Secondary | ICD-10-CM

## 2014-04-29 DIAGNOSIS — F331 Major depressive disorder, recurrent, moderate: Secondary | ICD-10-CM

## 2014-04-29 DIAGNOSIS — Z113 Encounter for screening for infections with a predominantly sexual mode of transmission: Secondary | ICD-10-CM

## 2014-04-29 DIAGNOSIS — E1140 Type 2 diabetes mellitus with diabetic neuropathy, unspecified: Secondary | ICD-10-CM

## 2014-04-29 LAB — COMPLETE METABOLIC PANEL WITH GFR
ALK PHOS: 55 U/L (ref 39–117)
ALT: 23 U/L (ref 0–53)
AST: 31 U/L (ref 0–37)
Albumin: 3.9 g/dL (ref 3.5–5.2)
BUN: 7 mg/dL (ref 6–23)
CHLORIDE: 106 meq/L (ref 96–112)
CO2: 26 mEq/L (ref 19–32)
Calcium: 9 mg/dL (ref 8.4–10.5)
Creat: 0.74 mg/dL (ref 0.50–1.35)
GFR, Est African American: >89 mL/min
Glucose, Bld: 123 mg/dL — ABNORMAL HIGH (ref 70–99)
Potassium: 4.5 mEq/L (ref 3.5–5.3)
Sodium: 140 mEq/L (ref 135–145)
Total Bilirubin: 0.3 mg/dL (ref 0.2–1.2)
Total Protein: 7.1 g/dL (ref 6.0–8.3)

## 2014-04-29 LAB — CBC WITH DIFFERENTIAL/PLATELET
Basophils Absolute: 0 10*3/uL (ref 0.0–0.1)
Basophils Relative: 0 % (ref 0–1)
Eosinophils Absolute: 0.1 10*3/uL (ref 0.0–0.7)
Eosinophils Relative: 1 % (ref 0–5)
HCT: 43 % (ref 39.0–52.0)
Hemoglobin: 14.6 g/dL (ref 13.0–17.0)
Lymphocytes Relative: 25 % (ref 12–46)
Lymphs Abs: 1.6 10*3/uL (ref 0.7–4.0)
MCH: 32.5 pg (ref 26.0–34.0)
MCHC: 34 g/dL (ref 30.0–36.0)
MCV: 95.8 fL (ref 78.0–100.0)
MONO ABS: 0.3 10*3/uL (ref 0.1–1.0)
MPV: 10.2 fL (ref 8.6–12.4)
Monocytes Relative: 5 % (ref 3–12)
Neutro Abs: 4.4 10*3/uL (ref 1.7–7.7)
Neutrophils Relative %: 69 % (ref 43–77)
PLATELETS: 136 10*3/uL — AB (ref 150–400)
RBC: 4.49 MIL/uL (ref 4.22–5.81)
RDW: 14 % (ref 11.5–15.5)
WBC: 6.4 10*3/uL (ref 4.0–10.5)

## 2014-04-29 MED ORDER — BENAZEPRIL HCL 20 MG PO TABS: 20.0000 mg | ORAL_TABLET | Freq: Every day | ORAL | Status: DC

## 2014-04-29 NOTE — Addendum Note (Signed)
Addended by: Dolan Amen D on: 04/29/2014 12:05 PM   Modules accepted: Orders

## 2014-04-29 NOTE — BH Specialist Note (Signed)
Nicholas Whitehead presented visibly down in spirits. [name] reports his sister has moved out his house and he has mixed emotions. He feels that she feels "some type of way" and his other siblings do not understand. [name] feels guilty about having to ask his sister to leave the home despite years of wanting her to move out. He stated he had to ask her to go to live with her daughter because he was paranoid that someone was going to report him and he would be charged with fraud.  He talks about feelings of loneliness but denies any suicidal thoughts. He denies drinking to sooth the pain but admits to having done so in the past. CBT was used in order to assist [name] with processing his feelings/emotions and to look out developing a new life for himself. [name] agrees to call in he needs to return prior to his scheduled appointment. Connie will return to therapy in two weeks.  Fransisca Kaufmann. Benjiman Core MSW Intern [place], [place]

## 2014-04-30 ENCOUNTER — Ambulatory Visit: Payer: Commercial Managed Care - HMO

## 2014-04-30 LAB — T-HELPER CELL (CD4) - (RCID CLINIC ONLY)
CD4 % Helper T Cell: 30 % — ABNORMAL LOW (ref 33–55)
CD4 T Cell Abs: 450 /uL (ref 400–2700)

## 2014-04-30 LAB — RPR

## 2014-04-30 LAB — URINE CYTOLOGY ANCILLARY ONLY
Chlamydia: NEGATIVE
Neisseria Gonorrhea: NEGATIVE

## 2014-05-01 ENCOUNTER — Other Ambulatory Visit (INDEPENDENT_AMBULATORY_CARE_PROVIDER_SITE_OTHER): Payer: Commercial Managed Care - HMO

## 2014-05-01 DIAGNOSIS — E1140 Type 2 diabetes mellitus with diabetic neuropathy, unspecified: Secondary | ICD-10-CM

## 2014-05-01 LAB — COMPREHENSIVE METABOLIC PANEL
ALBUMIN: 4.2 g/dL (ref 3.5–5.2)
ALT: 29 U/L (ref 0–53)
AST: 43 U/L — AB (ref 0–37)
Alkaline Phosphatase: 46 U/L (ref 39–117)
BUN: 6 mg/dL (ref 6–23)
CO2: 33 meq/L — AB (ref 19–32)
CREATININE: 0.83 mg/dL (ref 0.40–1.50)
Calcium: 9.5 mg/dL (ref 8.4–10.5)
Chloride: 103 mEq/L (ref 96–112)
GFR: 123.03 mL/min (ref >60.00)
Glucose, Bld: 127 mg/dL — ABNORMAL HIGH (ref 70–99)
Potassium: 4.3 mEq/L (ref 3.5–5.1)
Sodium: 137 mEq/L (ref 135–145)
Total Bilirubin: 0.4 mg/dL (ref 0.2–1.2)
Total Protein: 8.1 g/dL (ref 6.0–8.3)

## 2014-05-01 LAB — HEMOGLOBIN A1C: Hgb A1c MFr Bld: 7 % — ABNORMAL HIGH (ref 4.6–6.5)

## 2014-05-01 LAB — HIV-1 RNA QUANT-NO REFLEX-BLD
HIV 1 RNA Quant: <20 {copies}/mL
HIV-1 RNA Quant, Log: <1.3 {Log}

## 2014-05-06 ENCOUNTER — Ambulatory Visit: Payer: Medicare HMO | Admitting: Endocrinology

## 2014-05-11 ENCOUNTER — Other Ambulatory Visit: Payer: Self-pay | Admitting: *Deleted

## 2014-05-11 MED ORDER — INSULIN ASPART 100 UNIT/ML ~~LOC~~ SOLN: 20.0000 [IU] | Freq: Three times a day (TID) | SUBCUTANEOUS | Status: DC

## 2014-05-14 ENCOUNTER — Ambulatory Visit (INDEPENDENT_AMBULATORY_CARE_PROVIDER_SITE_OTHER): Payer: Medicare HMO | Admitting: Endocrinology

## 2014-05-14 ENCOUNTER — Ambulatory Visit: Payer: Commercial Managed Care - HMO

## 2014-05-14 ENCOUNTER — Encounter: Payer: Self-pay | Admitting: Internal Medicine

## 2014-05-14 ENCOUNTER — Encounter: Payer: Self-pay | Admitting: Endocrinology

## 2014-05-14 ENCOUNTER — Ambulatory Visit (INDEPENDENT_AMBULATORY_CARE_PROVIDER_SITE_OTHER): Payer: Commercial Managed Care - HMO | Admitting: Internal Medicine

## 2014-05-14 VITALS — BP 150/78 | HR 79 | Temp 98.3°F | Resp 16 | Ht 72.0 in | Wt 184.2 lb

## 2014-05-14 VITALS — BP 128/74 | HR 68 | Temp 98.3°F | Ht 73.25 in | Wt 184.0 lb

## 2014-05-14 DIAGNOSIS — IMO0002 Reserved for concepts with insufficient information to code with codable children: Secondary | ICD-10-CM

## 2014-05-14 DIAGNOSIS — B20 Human immunodeficiency virus [HIV] disease: Secondary | ICD-10-CM

## 2014-05-14 DIAGNOSIS — L0291 Cutaneous abscess, unspecified: Secondary | ICD-10-CM

## 2014-05-14 DIAGNOSIS — E1165 Type 2 diabetes mellitus with hyperglycemia: Secondary | ICD-10-CM

## 2014-05-14 DIAGNOSIS — L0390 Cellulitis, unspecified: Secondary | ICD-10-CM | POA: Diagnosis not present

## 2014-05-14 DIAGNOSIS — F331 Major depressive disorder, recurrent, moderate: Secondary | ICD-10-CM

## 2014-05-14 NOTE — Patient Instructions (Signed)
Please check blood sugars at least half the time about 2 hours after any meal and 3 times per week on waking up. Please bring blood sugar monitor to each visit. Recommended blood sugar levels about 2 hours after meal is 140-180 and on waking up 90-130  Stop Novolog at bedtime

## 2014-05-14 NOTE — Assessment & Plan Note (Signed)
Indurated area on left hand noted.  Discussed treatment including I and D.  Area cleaned with iodine in a sterile fashion, area injected with lidocaine over top of lesion.  I and D performed using I and D blade with return of blood with pus and drained.  Culture sent.  Patient tolerated procedure well with no complications.  Minimal blood loss.

## 2014-05-14 NOTE — Progress Notes (Signed)
  Subjective:    Patient ID: Nicholas Whitehead, male    DOB: May 17, 1955, 59 y.o.   MRN: [id number]  HPI He comes in for followup of his HIV. He continues on Complera and reports excellent compliance in taking medication with a high fatty meal.  He continues to follow with [facility] Ormond. He does continue to take his medication including higher dose of the benzodiazepine.  Manages his dm with Dr. Dwyane Dee and PCP Dr. [name].  CD4 450, viral load remains undetectable. Has a rash on both hands that is dry and scaley.  Left hand with tender area with induration that looks like abscess.    Review of Systems  Cardiovascular: Negative for leg swelling.  Gastrointestinal: Negative for nausea.  Genitourinary: Negative for discharge and genital sores.  Musculoskeletal: Negative for back pain.  Skin: Negative for rash.       Left hand with indurated and painful area  Neurological: Negative for dizziness, light-headedness and headaches.       Objective:   Physical Exam  Constitutional: He appears well-developed and well-nourished.  HENT:  Mouth/Throat: No oropharyngeal exudate.  Eyes: No scleral icterus.  Cardiovascular: Normal rate, regular rhythm and normal heart sounds.   No murmur heard. Pulmonary/Chest: Effort normal and breath sounds normal. No respiratory distress.  Lymphadenopathy:    He has no cervical adenopathy.  Skin: No rash noted.          Assessment & Plan:

## 2014-05-14 NOTE — Assessment & Plan Note (Signed)
Doing great, no new issues.  RTC 4-5 months

## 2014-05-14 NOTE — BH Specialist Note (Signed)
Nicholas Whitehead was sleepy today, reporting that he was up late last night when a friend knocked on his door at 1:30.  He said he sent him away and said it was too late to be visiting.  He talked about how his sister still has some of her things in his apartment,which frustrates him, because it feels like it isn't really his place still.  He said he is working on finding someone to help him move her things out.  He had a bandage on his hand and said Dr. Linus Salmons lanced a spot that was infected.  Otherwise, his humor was intact and he perked up as the session progressed, as he usually does.  Plan to meet in 2 weeks. Curley Spice, LCSW  Whodas: 28

## 2014-05-14 NOTE — Progress Notes (Signed)
Patient ID: Nicholas Whitehead, male   DOB: 19-Oct-1955, 59 y.o.   MRN: [id number]   Reason for Appointment: Type II Diabetes follow-up   History of Present Illness   Diagnosis date: 1998    Previous history: Nicholas Whitehead was started on oral agents at diagnosis and around 2006 was put on insulin His blood sugars are usually well controlled when Nicholas Whitehead takes Actos along with his insulin.  Recent history:   Insulin regimen: Lantus 60  bid, Novolog  20-25 AC tid         Nicholas Whitehead is on a regimen of basal bolus insulin Nicholas Whitehead has usually been requiring large doses of insulin especially  Lantus Because of poor control Nicholas Whitehead was started on Invokana in 4/15 and Actos was resumed With this his blood sugars are significantly improved and A1c is below 7 usually In 12/15 because of complaints of frequent urination and urgency his Invokana was stopped  His blood sugars had gone out of control in early 2016 because of poor compliance with his insulin and medications and also lack of glucose monitoring More recently Nicholas Whitehead has done much better with his insulin compliance and diet A1c is down to 7% again Unable to review his home readings as Nicholas Whitehead did not bring his monitor today For some reason Nicholas Whitehead has increased his Lantus insulin by 10 units twice a day on his own but does not appear to have hyperglycemia from this NOVOLOG: Nicholas Whitehead now sees that Nicholas Whitehead is taking this at bedtime also and not clear why.  Nicholas Whitehead will occasionally feel hypoglycemic during the night Does not have hypoglycemia in either time Nicholas Whitehead thinks Nicholas Whitehead is adjusting his NovoLog based on his meal size, between 20-25 units  Oral hypoglycemic drugs: Actos 30 mg    Side effects from medications: None Proper timing of medications in relation to meals: Yes.          Monitors blood glucose:     Glucometer:  Accu-Chek  Readings by recall:  PRE-MEAL Breakfast Lunch Dinner Bedtime Overall  Glucose range: 71-90 114 137 95-213   Mean/median:                Meals: 3 meals per  day.  usually avoiding high-fat meals         Physical activity: exercise: regular walking, having pain in his legs at times           Dietician visit: Most recent: 2013   Weight control:  Wt Readings from Last 3 Encounters:  05/14/14 184 lb (83.462 kg)  05/14/14 184 lb 3.2 oz (83.553 kg)  03/12/14 178 lb 3.2 oz (80.831 kg)          Diabetes labs:  Lab Results  Component Value Date   HGBA1C 7.0* 05/01/2014   HGBA1C 8.2* 03/12/2014   HGBA1C 6.9* 11/28/2013   Lab Results  Component Value Date   MICROALBUR 7.0* 09/08/2013   LDLCALC 50 09/08/2013   CREATININE 0.83 05/01/2014       Medication List       This list is accurate as of: 05/14/14 12:01 PM.  Always use your most recent med list.               ACCU-CHEK FASTCLIX LANCETS Misc  Use to check blood sugar 3 times per day dx code E11.49     aspirin EC 81 MG tablet  Take 81 mg by mouth every morning.     atenolol 50 MG tablet  Commonly known as:  TENORMIN  Take 1 tablet (50 mg total) by mouth daily.     benazepril 20 MG tablet  Commonly known as:  LOTENSIN  Take 1 tablet (20 mg total) by mouth daily.     cetirizine 10 MG tablet  Commonly known as:  ZYRTEC  Take 10 mg by mouth daily.     clonazePAM 1 MG tablet  Commonly known as:  KLONOPIN  Take 1-2 tablets (1-2 mg total) by mouth 2 (two) times daily. Take 1 tablet in the morning and 2 tablets at bedtime     COD LIVER OIL PO  Take 1 capsule by mouth every morning.     docusate sodium 100 MG capsule  Commonly known as:  COLACE  Take 100 mg by mouth every morning.     Emtricitab-Rilpivir-Tenofov DF 200-25-300 MG Tabs  Take 1 tablet by mouth daily.     gabapentin 300 MG capsule  Commonly known as:  NEURONTIN  Take 1 capsule (300 mg total) by mouth 3 (three) times daily.     glucose blood test strip  Commonly known as:  ACCU-CHEK SMARTVIEW  Use as instructed to check blood sugar 3 times per day dx code E11.49     insulin aspart 100 UNIT/ML injection   Commonly known as:  novoLOG  Inject 20-25 Units into the skin 3 (three) times daily with meals.     insulin glargine 100 unit/mL Sopn  Commonly known as:  LANTUS  Inject 60 Units into the skin 2 (two) times daily.     Insulin Pen Needle 31G X 8 MM Misc  Commonly known as:  B-D ULTRAFINE III SHORT PEN  Use to administer insulin Dx E11.9     Melatonin 3 MG Tabs  Take by mouth at bedtime.     multivitamin tablet  Take 1 tablet by mouth daily.     pioglitazone 30 MG tablet  Commonly known as:  ACTOS  Take 1 tablet (30 mg total) by mouth daily.     pravastatin 40 MG tablet  Commonly known as:  PRAVACHOL  Take 1 tablet (40 mg total) by mouth every evening.     sertraline 100 MG tablet  Commonly known as:  ZOLOFT  Take 1 tablet (100 mg total) by mouth daily before breakfast.     traZODone 100 MG tablet  Commonly known as:  DESYREL  Take 2 tablets (200 mg total) by mouth at bedtime.        Allergies:  Allergies  Allergen Reactions  . Glipizide Nausea Only    Past Medical History  Diagnosis Date  . HIV infection   . Hypertension   . Diabetes mellitus without complication   . Depression   . Anxiety   . Hypercholesterolemia   . Cataract     right   . DM (diabetes mellitus screen)   . HIV disease 09/12/1995  . Hyperlipidemia     Past Surgical History  Procedure Laterality Date  . Eye surgery  2005    catarct surgery  . Optic lens surgery  2005  . Colonoscopy with propofol  01/16/2012    Procedure: COLONOSCOPY WITH PROPOFOL;  Surgeon: Garlan Fair, MD;  Location: [facility];  Service: Endoscopy;  Laterality: N/A;    Family History  Problem Relation Age of Onset  . Arthritis Mother   . Diabetes Mother   . Heart disease Mother   . Arthritis Father   . Diabetes Father   . Heart disease Father  Social History:  reports that Nicholas Whitehead quit smoking about 2 years ago. His smoking use included Cigarettes. Nicholas Whitehead smoked 0.25 packs per day. Nicholas Whitehead has never used smokeless  tobacco. Nicholas Whitehead reports that Nicholas Whitehead does not drink alcohol or use illicit drugs.  Review of Systems:  Nicholas Whitehead is asking about a painful area on the dorsum of his left hand for the last week or so  Hypertension:  currently on atenolol and back on benazepril  Lipids: LDL is controlled with taking pravastatin  Lab Results  Component Value Date   CHOL 104 09/08/2013   HDL 43.30 09/08/2013   LDLCALC 50 09/08/2013   TRIG 53.0 09/08/2013   CHOLHDL 2 09/08/2013    Nicholas Whitehead has symptoms of neuropathy with paresthesia in feet and legs and symptoms are well controlled with 300 mg of gabapentin, 3 daily.   Depression: Not well-controlled, continues to take Zoloft    LABS:  No visits with results within 1 Week(s) from this visit. Latest known visit with results is:  Lab on 05/01/2014  Component Date Value Ref Range Status  . Hgb A1c MFr Bld 05/01/2014 7.0* 4.6 - 6.5 % Final   Glycemic Control Guidelines for People with Diabetes:Non Diabetic:  <6%Goal of Therapy: <7%Additional Action Suggested:  >8%   . Sodium 05/01/2014 137  135 - 145 mEq/L Final  . Potassium 05/01/2014 4.3  3.5 - 5.1 mEq/L Final  . Chloride 05/01/2014 103  96 - 112 mEq/L Final  . CO2 05/01/2014 33* 19 - 32 mEq/L Final  . Glucose, Bld 05/01/2014 127* 70 - 99 mg/dL Final  . BUN 05/01/2014 6  6 - 23 mg/dL Final  . Creatinine, Ser 05/01/2014 0.83  0.40 - 1.50 mg/dL Final  . Total Bilirubin 05/01/2014 0.4  0.2 - 1.2 mg/dL Final  . Alkaline Phosphatase 05/01/2014 46  39 - 117 U/L Final  . AST 05/01/2014 43* 0 - 37 U/L Final  . ALT 05/01/2014 29  0 - 53 U/L Final  . Total Protein 05/01/2014 8.1  6.0 - 8.3 g/dL Final  . Albumin 05/01/2014 4.2  3.5 - 5.2 g/dL Final  . Calcium 05/01/2014 9.5  8.4 - 10.5 mg/dL Final  . GFR 05/01/2014 123.03  >60.00 mL/min Final     Examination:   BP 150/78 mmHg  Pulse 79  Temp(Src) 98.3 F (36.8 C)  Resp 16  Ht 6' (1.829 m)  Wt 184 lb 3.2 oz (83.553 kg)  BMI 24.98 kg/m2  SpO2 91%  Body mass index is  24.98 kg/(m^2).    Nicholas Whitehead has a nodular area on the dorsum of his left hand, not warm or erythematous and minimally tender.  Has pain on movement of the hand  ASSESSMENT/ PLAN:    Diabetes type 2:  Recent blood sugars are difficult to assess as Nicholas Whitehead has not brought his monitor for download A1c is better at 7% with improved compliance with all his insulin doses and Actos However the patient is taking NovoLog at bedtime and discussed that this is not needed and would cause nocturnal hypoglycemia Otherwise will continue his insulin regimen unchanged  Left hand nodule with pain: This does not appear to have infection and Nicholas Whitehead will follow-up with PCP  Hypertension: Followed by PCP  Patient Instructions  Please check blood sugars at least half the time about 2 hours after any meal and 3 times per week on waking up. Please bring blood sugar monitor to each visit. Recommended blood sugar levels about 2 hours after meal is 140-180  and on waking up 90-130  Stop Novolog at bedtime        Jamonta Goerner 05/14/2014, 12:01 PM

## 2014-05-17 LAB — WOUND CULTURE
GRAM STAIN: NONE SEEN
GRAM STAIN: NONE SEEN
Gram Stain: NONE SEEN
Organism ID, Bacteria: NO GROWTH

## 2014-05-25 DIAGNOSIS — E119 Type 2 diabetes mellitus without complications: Secondary | ICD-10-CM | POA: Diagnosis not present

## 2014-05-25 LAB — HM DIABETES EYE EXAM

## 2014-05-28 ENCOUNTER — Ambulatory Visit: Payer: Commercial Managed Care - HMO

## 2014-06-04 ENCOUNTER — Ambulatory Visit: Payer: Commercial Managed Care - HMO

## 2014-06-04 DIAGNOSIS — F331 Major depressive disorder, recurrent, moderate: Secondary | ICD-10-CM

## 2014-06-04 NOTE — BH Specialist Note (Signed)
Nicholas Whitehead was in a better mood today as he talked about how his life is a bit easier now that his sister has moved out.  He said his family has been upset with him for "kicking out" his sister, but he feels like he did what he had to do and that he did help her for several years.  I validated this for him.  He was able to joke around some today, which is a good sign.  I facilitated him meeting with Myriam Jacobson at [facility] re: food assistance.  Plan to meet in 2 weeks. Curley Spice, LCSW  Whodas: 30

## 2014-06-09 ENCOUNTER — Other Ambulatory Visit: Payer: Medicare HMO

## 2014-06-12 ENCOUNTER — Ambulatory Visit: Payer: Medicare HMO | Admitting: Endocrinology

## 2014-06-15 ENCOUNTER — Telehealth: Payer: Self-pay | Admitting: *Deleted

## 2014-06-15 NOTE — Telephone Encounter (Signed)
Requesting f/u of cellulits.  Dr. Linus Salmons does not have any appointments until July.  Pt given appt for Thurs., June 9 with Dr. Megan Salon.

## 2014-06-18 ENCOUNTER — Other Ambulatory Visit: Payer: Self-pay | Admitting: *Deleted

## 2014-06-18 ENCOUNTER — Ambulatory Visit: Payer: Commercial Managed Care - HMO

## 2014-06-18 ENCOUNTER — Ambulatory Visit (INDEPENDENT_AMBULATORY_CARE_PROVIDER_SITE_OTHER): Payer: Commercial Managed Care - HMO | Admitting: Internal Medicine

## 2014-06-18 ENCOUNTER — Encounter: Payer: Self-pay | Admitting: Internal Medicine

## 2014-06-18 VITALS — BP 151/76 | HR 61 | Temp 97.7°F | Wt 168.0 lb

## 2014-06-18 DIAGNOSIS — L0390 Cellulitis, unspecified: Secondary | ICD-10-CM | POA: Diagnosis not present

## 2014-06-18 DIAGNOSIS — L309 Dermatitis, unspecified: Secondary | ICD-10-CM | POA: Diagnosis not present

## 2014-06-18 DIAGNOSIS — L0291 Cutaneous abscess, unspecified: Secondary | ICD-10-CM | POA: Diagnosis not present

## 2014-06-18 MED ORDER — DOXYCYCLINE HYCLATE 100 MG PO TABS: 100.0000 mg | ORAL_TABLET | Freq: Two times a day (BID) | ORAL | Status: DC

## 2014-06-18 MED ORDER — TRIAMCINOLONE 0.1 % CREAM:EUCERIN CREAM 1:1: 1.0000 "application " | TOPICAL_CREAM | Freq: Three times a day (TID) | CUTANEOUS | Status: DC

## 2014-06-18 NOTE — Progress Notes (Signed)
Patient ID: Nicholas Whitehead, male   DOB: 05/20/1955, 59 y.o.   MRN: [id number]          Patient Active Problem List   Diagnosis Date Noted  . Dermatitis 06/18/2014  . Cellulitis and abscess 05/14/2014  . Long toenail 08/21/2013  . Screening examination for venereal disease 08/12/2013  . Routine general medical examination at a health care facility 05/21/2013  . BPH (benign prostatic hyperplasia) 05/21/2013  . 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 Anxiety and depression 04/18/2011  . Hyperlipidemia   . Diabetes mellitus with diabetic neuropathy   . Hypertension   . HIV disease 09/12/1995    Patient's Medications  New Prescriptions   DOXYCYCLINE (VIBRA-TABS) 100 MG TABLET    Take 1 tablet (100 mg total) by mouth 2 (two) times daily.   TRIAMCINOLONE ACETONIDE (TRIAMCINOLONE 0.1 % CREAM : EUCERIN) CREA    Apply 1 application topically 3 (three) times daily.  Previous Medications   ACCU-CHEK FASTCLIX LANCETS MISC    Use to check blood sugar 3 times per day dx code E11.49   ASPIRIN EC 81 MG TABLET    Take 81 mg by mouth every morning.   ATENOLOL (TENORMIN) 50 MG TABLET    Take 1 tablet (50 mg total) by mouth daily.   BENAZEPRIL (LOTENSIN) 20 MG TABLET    Take 1 tablet (20 mg total) by mouth daily.   CETIRIZINE (ZYRTEC) 10 MG TABLET    Take 10 mg by mouth daily.   CLONAZEPAM (KLONOPIN) 1 MG TABLET    Take 1-2 tablets (1-2 mg total) by mouth 2 (two) times daily. Take 1 tablet in the morning and 2 tablets at bedtime   COD LIVER OIL PO    Take 1 capsule by mouth every morning.   DOCUSATE SODIUM (COLACE) 100 MG CAPSULE    Take 100 mg by mouth every morning.   EMTRICITAB-RILPIVIR-TENOFOVIR 200-25-300 MG TABS    Take 1 tablet by mouth daily.   GABAPENTIN (NEURONTIN) 300 MG CAPSULE    Take 1 capsule (300 mg total) by mouth 3 (three) times daily.   GLUCOSE BLOOD (ACCU-CHEK SMARTVIEW) TEST STRIP    Use as instructed to check blood sugar 3 times per day dx code E11.49   INSULIN ASPART (NOVOLOG) 100  UNIT/ML INJECTION    Inject 20-25 Units into the skin 3 (three) times daily with meals.   INSULIN GLARGINE (LANTUS) 100 UNIT/ML SOPN    Inject 60 Units into the skin 2 (two) times daily.   INSULIN PEN NEEDLE (B-D ULTRAFINE III SHORT PEN) 31G X 8 MM MISC    Use to administer insulin Dx E11.9   MELATONIN 3 MG TABS    Take by mouth at bedtime.   MULTIPLE VITAMIN (MULTIVITAMIN) TABLET    Take 1 tablet by mouth daily.     PIOGLITAZONE (ACTOS) 30 MG TABLET    Take 1 tablet (30 mg total) by mouth daily.   PRAVASTATIN (PRAVACHOL) 40 MG TABLET    Take 1 tablet (40 mg total) by mouth every evening.   SERTRALINE (ZOLOFT) 100 MG TABLET    Take 1 tablet (100 mg total) by mouth daily before breakfast.   TRAZODONE (DESYREL) 100 MG TABLET    Take 2 tablets (200 mg total) by mouth at bedtime.  Modified Medications   No medications on file  Discontinued Medications   No medications on file    Subjective: Mr. Fuston is seen on a work in basis. He is followed by my  partner Dr. Linus Salmons last month and had a small abscess on the dorsum of his left hand lanced. Cultures of the abscess fluid were negative. Since that time he's had another small nodule appear near the previous site of the I&D. He has also developed recurrent dermatitis over the dorsum of both hands. He had a similar type of dermatitis many years ago when he was followed by Dr. Tonia Ghent. He cannot remember the name of the medicated cream that he used with success but when I mentioned Eucerin he thought that that sounded familiar. He was seen by a dermatologist to get a biopsy that did not reveal a reason for the dermatitis.  Review of Systems: Pertinent items are noted in HPI.  Past Medical History  Diagnosis Date  . HIV infection   . Hypertension   . Diabetes mellitus without complication   . Depression   . Anxiety   . Hypercholesterolemia   . Cataract     right   . DM (diabetes mellitus screen)   . HIV disease 09/12/1995  . Hyperlipidemia      History  Substance Use Topics  . Smoking status: Former Smoker -- 0.25 packs/day    Types: Cigarettes    Quit date: 01/09/2012  . Smokeless tobacco: Never Used  . Alcohol Use: No     Comment: socially    Family History  Problem Relation Age of Onset  . Arthritis Mother   . Diabetes Mother   . Heart disease Mother   . Arthritis Father   . Diabetes Father   . Heart disease Father     Allergies  Allergen Reactions  . Glipizide Nausea Only    Objective: Temp: 97.7 F (36.5 C) (06/09 1524) Temp Source: Oral (06/09 1524) BP: 151/76 mmHg (06/09 1524) Pulse Rate: 61 (06/09 1524) Body mass index is 22 kg/(m^2).  General: He is pleasant and in no distress Hands: Dry cracked thickened skin over the dorsum of both hands. There is a 10 mm nodule on the dorsum of the left hand that is superficially denuded. There is no drainage.  Lab Results Lab Results  Component Value Date   WBC 6.4 04/29/2014   HGB 14.6 04/29/2014   HCT 43.0 04/29/2014   MCV 95.8 04/29/2014   PLT 136* 04/29/2014    Lab Results  Component Value Date   CREATININE 0.83 05/01/2014   BUN 6 05/01/2014   NA 137 05/01/2014   K 4.3 05/01/2014   CL 103 05/01/2014   CO2 33* 05/01/2014    Lab Results  Component Value Date   ALT 29 05/01/2014   AST 43* 05/01/2014   ALKPHOS 46 05/01/2014   BILITOT 0.4 05/01/2014    Lab Results  Component Value Date   CHOL 104 09/08/2013   HDL 43.30 09/08/2013   LDLCALC 50 09/08/2013   TRIG 53.0 09/08/2013   CHOLHDL 2 09/08/2013    Lab Results HIV 1 RNA QUANT (copies/mL)  Date Value  04/29/2014 <20  12/11/2013 <20  07/30/2013 <20   CD4 T CELL ABS (/uL)  Date Value  04/29/2014 450  12/11/2013 840  07/30/2013 680     Assessment: He has a history of recurrent dermatitis. I will treat him with Eucerin cream.  He has a history of MRSA boils. I will treat him with doxycycline.  Plan: 1. Eucerin cream 2. Doxycycline 100 mg twice a day for 5  days 3. Follow-up in 3 months   Michel Bickers, MD [facility]  [facility] [phone] pager   [phone] cell 06/18/2014, 4:20 PM

## 2014-06-19 ENCOUNTER — Other Ambulatory Visit: Payer: Self-pay | Admitting: *Deleted

## 2014-06-22 ENCOUNTER — Other Ambulatory Visit: Payer: Self-pay | Admitting: Endocrinology

## 2014-06-23 NOTE — Telephone Encounter (Signed)
Error

## 2014-06-25 ENCOUNTER — Ambulatory Visit: Payer: Commercial Managed Care - HMO

## 2014-06-25 DIAGNOSIS — F32A Depression, unspecified: Secondary | ICD-10-CM

## 2014-06-25 DIAGNOSIS — F329 Major depressive disorder, single episode, unspecified: Secondary | ICD-10-CM

## 2014-06-25 NOTE — BH Specialist Note (Signed)
Erika was in a fairly good mood today.  He talked about how his hand got infected and he came here last week and got it treated.  He said it was definitely getting better.  He also talked about a neighbor he used to check on, but who recently told him he didn't need to come over anymore, which was a "slap in the face".  He was able to laugh some today and show some of his dry humor, which indicates he is doing well.  Plan to meet in 2 weeks. Curley Spice, LCSW

## 2014-07-01 ENCOUNTER — Telehealth: Payer: Self-pay

## 2014-07-01 MED ORDER — CLONAZEPAM 1 MG PO TABS: 1.0000 mg | ORAL_TABLET | Freq: Two times a day (BID) | ORAL | Status: DC

## 2014-07-01 NOTE — Telephone Encounter (Signed)
Received refill request from [facility]   request refills for Clonazepam 1 mg tablet . Rx last written 03/24/2014 and pt last seen 02/05/2014 . Please advise Thanks

## 2014-07-01 NOTE — Telephone Encounter (Signed)
done

## 2014-07-01 NOTE — Addendum Note (Signed)
Addended by: [name] on: 07/01/2014 10:24 AM   Modules accepted: Orders

## 2014-07-02 ENCOUNTER — Ambulatory Visit (INDEPENDENT_AMBULATORY_CARE_PROVIDER_SITE_OTHER): Payer: Commercial Managed Care - HMO | Admitting: Internal Medicine

## 2014-07-02 ENCOUNTER — Ambulatory Visit: Payer: Commercial Managed Care - HMO

## 2014-07-02 VITALS — BP 160/82 | HR 62 | Temp 98.2°F | Resp 16 | Ht 72.0 in | Wt 168.0 lb

## 2014-07-02 DIAGNOSIS — I10 Essential (primary) hypertension: Secondary | ICD-10-CM | POA: Diagnosis not present

## 2014-07-02 DIAGNOSIS — F418 Other specified anxiety disorders: Secondary | ICD-10-CM | POA: Diagnosis not present

## 2014-07-02 DIAGNOSIS — F329 Major depressive disorder, single episode, unspecified: Secondary | ICD-10-CM

## 2014-07-02 DIAGNOSIS — F419 Anxiety disorder, unspecified: Secondary | ICD-10-CM

## 2014-07-02 MED ORDER — NEBIVOLOL HCL 10 MG PO TABS: 10.0000 mg | ORAL_TABLET | Freq: Every day | ORAL | Status: DC

## 2014-07-02 NOTE — Progress Notes (Signed)
Pre visit review using our clinic review tool, if applicable. No additional management support is needed unless otherwise documented below in the visit note. 

## 2014-07-02 NOTE — Patient Instructions (Signed)

## 2014-07-02 NOTE — Progress Notes (Signed)
Subjective:  Patient ID: Nicholas Whitehead, male    DOB: 1955/10/24  Age: 59 y.o. MRN: [id number]  CC: Hypertension   HPI Arael Piccione presents for a BP check - he offers no new complaints. He needs a [place] form completed to help him with transportation in light of the neuropathy in his feet.  Outpatient Prescriptions Prior to Visit  Medication Sig Dispense Refill  . ACCU-CHEK FASTCLIX LANCETS MISC USE  TO CHECK BLOOD SUGAR THREE TIMES DAILY 306 each 1  . ACCU-CHEK SMARTVIEW test strip USE AS DIRECTED  TO CHECK BLOOD SUGAR THREE TIMES DAILY 300 each 1  . aspirin EC 81 MG tablet Take 81 mg by mouth every morning.    . benazepril (LOTENSIN) 20 MG tablet Take 1 tablet (20 mg total) by mouth daily. 90 tablet 3  . cetirizine (ZYRTEC) 10 MG tablet Take 10 mg by mouth daily.    . clonazePAM (KLONOPIN) 1 MG tablet Take 1-2 tablets (1-2 mg total) by mouth 2 (two) times daily. Take 1 tablet in the morning and 2 tablets at bedtime 60 tablet 3  . COD LIVER OIL PO Take 1 capsule by mouth every morning.    . docusate sodium (COLACE) 100 MG capsule Take 100 mg by mouth every morning.    Marland Kitchen Emtricitab-Rilpivir-Tenofovir 200-25-300 MG TABS Take 1 tablet by mouth daily. 30 tablet 11  . gabapentin (NEURONTIN) 300 MG capsule Take 1 capsule (300 mg total) by mouth 3 (three) times daily. 90 capsule 3  . insulin aspart (NOVOLOG) 100 UNIT/ML injection Inject 20-25 Units into the skin 3 (three) times daily with meals. 30 mL 1  . insulin glargine (LANTUS) 100 unit/mL SOPN Inject 60 Units into the skin 2 (two) times daily.    . Insulin Pen Needle (B-D ULTRAFINE III SHORT PEN) 31G X 8 MM MISC Use to administer insulin Dx E11.9 300 each 3  . Melatonin 3 MG TABS Take by mouth at bedtime.    . Multiple Vitamin (MULTIVITAMIN) tablet Take 1 tablet by mouth daily.      . pioglitazone (ACTOS) 30 MG tablet Take 1 tablet (30 mg total) by mouth daily. 30 tablet 3  . pravastatin (PRAVACHOL) 40 MG tablet Take 1 tablet (40 mg total)  by mouth every evening. 30 tablet 4  . sertraline (ZOLOFT) 100 MG tablet Take 1 tablet (100 mg total) by mouth daily before breakfast. 30 tablet 11  . traZODone (DESYREL) 100 MG tablet Take 2 tablets (200 mg total) by mouth at bedtime. 60 tablet 11  . Triamcinolone Acetonide (TRIAMCINOLONE 0.1 % CREAM : EUCERIN) CREA Apply 1 application topically 3 (three) times daily. 1 each 3  . atenolol (TENORMIN) 50 MG tablet Take 1 tablet (50 mg total) by mouth daily. 30 tablet 11  . doxycycline (VIBRA-TABS) 100 MG tablet Take 1 tablet (100 mg total) by mouth 2 (two) times daily. 10 tablet 0   No facility-administered medications prior to visit.    ROS Review of Systems  Constitutional: Negative.  Negative for fever, chills, diaphoresis, appetite change and fatigue.  HENT: Negative.   Eyes: Negative.   Respiratory: Negative.  Negative for cough, choking, chest tightness, shortness of breath and stridor.   Cardiovascular: Negative.  Negative for chest pain, palpitations and leg swelling.  Gastrointestinal: Negative.  Negative for nausea, vomiting, abdominal pain, diarrhea, constipation and blood in stool.  Endocrine: Negative.   Genitourinary: Negative.   Musculoskeletal: Negative.  Negative for myalgias, back pain, joint swelling, arthralgias and neck  pain.  Skin: Positive for rash. Negative for color change and wound.  Allergic/Immunologic: Negative.   Neurological: Positive for numbness. Negative for dizziness, tremors, seizures, syncope, weakness, light-headedness and headaches.  Hematological: Negative.  Negative for adenopathy. Does not bruise/bleed easily.  Psychiatric/Behavioral: Positive for sleep disturbance. Negative for suicidal ideas, hallucinations, self-injury, dysphoric mood and agitation. The patient is nervous/anxious. The patient is not hyperactive.     Objective:  BP 160/82 mmHg  Pulse 62  Temp(Src) 98.2 F (36.8 C) (Oral)  Ht 6' (1.829 m)  Wt 168 lb (76.204 kg)  BMI 22.78  kg/m2  SpO2 92%  BP Readings from Last 3 Encounters:  07/02/14 160/82  06/18/14 151/76  05/14/14 128/74    Wt Readings from Last 3 Encounters:  07/02/14 168 lb (76.204 kg)  06/18/14 168 lb (76.204 kg)  05/14/14 184 lb (83.462 kg)    Physical Exam  Constitutional: He is oriented to person, place, and time. He appears well-developed and well-nourished. No distress.  HENT:  Mouth/Throat: Oropharynx is clear and moist. No oropharyngeal exudate.  Eyes: Conjunctivae are normal. Right eye exhibits no discharge. Left eye exhibits no discharge. No scleral icterus.  Neck: Normal range of motion. Neck supple. No JVD present. No tracheal deviation present. No thyromegaly present.  Cardiovascular: Normal rate, regular rhythm, normal heart sounds and intact distal pulses.  Exam reveals no gallop and no friction rub.   No murmur heard. Pulmonary/Chest: Effort normal and breath sounds normal. No stridor. No respiratory distress. He has no wheezes. He has no rales. He exhibits no tenderness.  Abdominal: Soft. Bowel sounds are normal. He exhibits no distension and no mass. There is no tenderness. There is no rebound and no guarding.  Musculoskeletal: Normal range of motion. He exhibits no edema or tenderness.  Lymphadenopathy:    He has no cervical adenopathy.  Neurological: He is oriented to person, place, and time.  Skin: Skin is warm and dry. No rash noted. He is not diaphoretic. No erythema. No pallor.  Scaling and lichenification over both FA's and the dorsum of both hands  Psychiatric: He has a normal mood and affect. His behavior is normal. Judgment and thought content normal.  Vitals reviewed.   Lab Results  Component Value Date   WBC 6.4 04/29/2014   HGB 14.6 04/29/2014   HCT 43.0 04/29/2014   PLT 136* 04/29/2014   GLUCOSE 127* 05/01/2014   CHOL 104 09/08/2013   TRIG 53.0 09/08/2013   HDL 43.30 09/08/2013   LDLCALC 50 09/08/2013   ALT 29 05/01/2014   AST 43* 05/01/2014   NA 137  05/01/2014   K 4.3 05/01/2014   CL 103 05/01/2014   CREATININE 0.83 05/01/2014   BUN 6 05/01/2014   CO2 33* 05/01/2014   TSH 0.87 03/25/2013   PSA 0.71 05/21/2013   HGBA1C 7.0* 05/01/2014   MICROALBUR 7.0* 09/08/2013    No results found.  Assessment & Plan:   Abdifatah was seen today for hypertension.  Diagnoses and all orders for this visit:  Essential hypertension - his BP is not well controlled, I think bystolic would be a better BP med than atenolol, will cont the ACEI Orders: -     nebivolol (BYSTOLIC) 10 MG tablet; Take 1 tablet (10 mg total) by mouth daily.  Anxiety and depression - will cont klonopin as needed   I have discontinued Mr. Marmolejos atenolol and doxycycline. I am also having him start on nebivolol. Additionally, I am having him maintain his multivitamin, cetirizine,  aspirin EC, COD LIVER OIL PO, docusate sodium, Melatonin, insulin glargine, traZODone, sertraline, pioglitazone, Emtricitab-Rilpivir-Tenofov DF, gabapentin, pravastatin, Insulin Pen Needle, benazepril, insulin aspart, triamcinolone 0.1 % cream : eucerin, ACCU-CHEK FASTCLIX LANCETS, ACCU-CHEK SMARTVIEW, and clonazePAM.  Meds ordered this encounter  Medications  . nebivolol (BYSTOLIC) 10 MG tablet    Sig: Take 1 tablet (10 mg total) by mouth daily.    Dispense:  30 tablet    Refill:  11     Follow-up: Return in about 3 months (around 10/02/2014).  Scarlette Calico, MD

## 2014-07-04 ENCOUNTER — Encounter: Payer: Self-pay | Admitting: Internal Medicine

## 2014-07-06 ENCOUNTER — Other Ambulatory Visit: Payer: Self-pay | Admitting: Internal Medicine

## 2014-07-06 ENCOUNTER — Ambulatory Visit: Payer: Commercial Managed Care - HMO

## 2014-07-06 ENCOUNTER — Other Ambulatory Visit: Payer: Self-pay | Admitting: *Deleted

## 2014-07-06 DIAGNOSIS — F411 Generalized anxiety disorder: Secondary | ICD-10-CM

## 2014-07-06 MED ORDER — PIOGLITAZONE HCL 30 MG PO TABS: 30.0000 mg | ORAL_TABLET | Freq: Every day | ORAL | Status: DC

## 2014-07-06 MED ORDER — CLONAZEPAM 1 MG PO TABS: 1.0000 mg | ORAL_TABLET | Freq: Three times a day (TID) | ORAL | Status: DC | PRN

## 2014-07-08 ENCOUNTER — Telehealth: Payer: Self-pay | Admitting: Internal Medicine

## 2014-07-08 NOTE — Telephone Encounter (Signed)
Informed pt .

## 2014-07-08 NOTE — Telephone Encounter (Signed)
Patient has called back and they will be faxing this form back over to [name]. There is some information missing from it. Once its filled please fax back.

## 2014-07-08 NOTE — Telephone Encounter (Signed)
Paperwork was faxed and sent down to be scanned. I have no idea who long it takes to show up in chart, plus pt was given a copy for his own records.

## 2014-07-08 NOTE — Telephone Encounter (Signed)
Patient called regarding his [employer] paperwork that was supposed to be faxed last week. Apparently they were having problems with their fax machine and he was wanting [name] to fax this paperwork back over and then call Anderson Malta with [employer] at [phone]. He did not have their fax number.  I verified with Rachel Bo that he does not have this paperwork.

## 2014-07-14 NOTE — Telephone Encounter (Signed)
Patient is calling back, he need [employer] form fax.

## 2014-07-15 ENCOUNTER — Telehealth: Payer: Self-pay | Admitting: Internal Medicine

## 2014-07-15 NOTE — Telephone Encounter (Signed)
Patient is calling back. [employer] was supposed to be faxing this back to [name]. Have we received it?

## 2014-07-15 NOTE — Telephone Encounter (Signed)
Patient is stating that [facility] on [address] never received the fax for clonazePAM (KLONOPIN) 1 MG tablet [[id number]

## 2014-07-16 ENCOUNTER — Other Ambulatory Visit: Payer: Self-pay

## 2014-07-16 DIAGNOSIS — F411 Generalized anxiety disorder: Secondary | ICD-10-CM

## 2014-07-16 MED ORDER — CLONAZEPAM 1 MG PO TABS: 1.0000 mg | ORAL_TABLET | Freq: Three times a day (TID) | ORAL | Status: DC | PRN

## 2014-07-16 NOTE — Telephone Encounter (Signed)
Noted  

## 2014-07-16 NOTE — Telephone Encounter (Signed)
Pt called in and just wanted you to know that he rec'd the Clonazepam from the mail order today.  Just FYI!!!

## 2014-07-16 NOTE — Telephone Encounter (Signed)
Printed up application and refaxed to [phone]

## 2014-07-22 ENCOUNTER — Ambulatory Visit: Payer: Commercial Managed Care - HMO

## 2014-07-22 ENCOUNTER — Other Ambulatory Visit: Payer: Self-pay | Admitting: *Deleted

## 2014-07-22 DIAGNOSIS — E1140 Type 2 diabetes mellitus with diabetic neuropathy, unspecified: Secondary | ICD-10-CM

## 2014-07-22 DIAGNOSIS — F329 Major depressive disorder, single episode, unspecified: Secondary | ICD-10-CM

## 2014-07-22 DIAGNOSIS — F32A Depression, unspecified: Secondary | ICD-10-CM

## 2014-07-22 MED ORDER — GABAPENTIN 300 MG PO CAPS: 300.0000 mg | ORAL_CAPSULE | Freq: Three times a day (TID) | ORAL | Status: DC

## 2014-07-22 NOTE — BH Specialist Note (Signed)
Nicholas Whitehead was preoccupied today with getting papers filled out for [facility], so that he can get rides to his medical appointments, etc.  After this he told a hilarious story of how he went to a friend's bachelorette party recently and did a "strip tease", wearing a robe, but with black shorts and a black "wife beater" tee shirt underneath.  He and I laughed for several minutes over this.  He seems to be doing fairly well at this time and obviously still is able to have a good time when he wants to.  Plan to meet in 3 weeks. Curley Spice, LCSW

## 2014-07-23 ENCOUNTER — Ambulatory Visit: Payer: Commercial Managed Care - HMO | Admitting: Internal Medicine

## 2014-08-11 ENCOUNTER — Other Ambulatory Visit (INDEPENDENT_AMBULATORY_CARE_PROVIDER_SITE_OTHER): Payer: Commercial Managed Care - HMO

## 2014-08-11 DIAGNOSIS — E1165 Type 2 diabetes mellitus with hyperglycemia: Secondary | ICD-10-CM

## 2014-08-11 DIAGNOSIS — IMO0002 Reserved for concepts with insufficient information to code with codable children: Secondary | ICD-10-CM

## 2014-08-11 LAB — BASIC METABOLIC PANEL
BUN: 10 mg/dL (ref 6–23)
CALCIUM: 9.2 mg/dL (ref 8.4–10.5)
CO2: 31 mEq/L (ref 19–32)
Chloride: 108 mEq/L (ref 96–112)
Creatinine, Ser: 0.85 mg/dL (ref 0.40–1.50)
GFR: 119.58 mL/min (ref >60.00)
Glucose, Bld: 64 mg/dL — ABNORMAL LOW (ref 70–99)
Potassium: 3.7 mEq/L (ref 3.5–5.1)
Sodium: 143 mEq/L (ref 135–145)

## 2014-08-11 LAB — HEMOGLOBIN A1C: HEMOGLOBIN A1C: 6.2 % (ref 4.6–6.5)

## 2014-08-12 ENCOUNTER — Ambulatory Visit: Payer: Commercial Managed Care - HMO

## 2014-08-12 DIAGNOSIS — F32A Depression, unspecified: Secondary | ICD-10-CM

## 2014-08-12 DIAGNOSIS — F329 Major depressive disorder, single episode, unspecified: Secondary | ICD-10-CM

## 2014-08-12 NOTE — BH Specialist Note (Signed)
Nicholas Whitehead was in good spirits today, saying that his married next door neighbor "came on to him" recently, but he turned him down so as to avoid any "drama".  He also talked about the socializing he does with other neighbors and said he enjoys it. He did talk some about his chronic pain and I mentioned my training in hypnosis but he said he didn't think he could do it.  Plan to meet in 2 weeks. Curley Spice, LCSW

## 2014-08-14 ENCOUNTER — Ambulatory Visit (INDEPENDENT_AMBULATORY_CARE_PROVIDER_SITE_OTHER): Payer: Commercial Managed Care - HMO | Admitting: Endocrinology

## 2014-08-14 ENCOUNTER — Other Ambulatory Visit: Payer: Self-pay | Admitting: *Deleted

## 2014-08-14 ENCOUNTER — Encounter: Payer: Self-pay | Admitting: Endocrinology

## 2014-08-14 VITALS — BP 138/80 | HR 54 | Temp 97.9°F | Resp 16 | Ht 72.0 in | Wt 184.0 lb

## 2014-08-14 DIAGNOSIS — E119 Type 2 diabetes mellitus without complications: Secondary | ICD-10-CM | POA: Diagnosis not present

## 2014-08-14 MED ORDER — INSULIN ASPART 100 UNIT/ML FLEXPEN: PEN_INJECTOR | SUBCUTANEOUS | Status: DC

## 2014-08-14 NOTE — Progress Notes (Signed)
Patient ID: Nicholas Whitehead, male   DOB: Aug 22, 1955, 59 y.o.   MRN: [id number]   Reason for Appointment: Type II Diabetes follow-up   History of Present Illness   Diagnosis date: 1998    Previous history: He was started on oral agents at diagnosis and around 2006 was put on insulin His blood sugars are usually well controlled when he takes Actos along with his insulin. He has been on Invokana since 04/2013 Recent history:   Insulin regimen: Lantus 60  bid, Novolog  20 AC tid         He is on a regimen of basal bolus insulin with Actos. He has usually been requiring large doses of insulin especially  Lantus With this his blood sugars are significantly improved and A1c is below 7 usually In 12/15 because of complaints of frequent urination and urgency his Anastasio Auerbach was stopped  Since about 2016 he has done much better with his insulin compliance and diet A1c is down to 6.2 which is about the best he has had On his last visit his insulin doses were unchanged but he was told not to take any Novolog at bedtime  Current blood sugar patterns and problems:  His blood sugars are overall fairly stable throughout the day with only occasional fluctuations  Has only one high reading in the morning from forgetting to take his Lantus the night before  He may sporadically have high readings after lunch or supper probably from inconsistent diet  Taking fairly consistent doses of Novolog of 20 units despite variable diet  Oral hypoglycemic drugs: Actos 30 mg    Side effects from medications: None Proper timing of medications in relation to meals: Yes.          Monitors blood glucose:  1-2 times a day  Glucometer:  Accu-Chek  Mean values apply above for all meters except median for One Touch  PRE-MEAL Fasting Lunch Dinner Bedtime Overall  Glucose range:  87-173   79-155   114, 134   95-181    Mean/median:  110   126    136  126          Meals: 3 meals per day. Lunch 2-3pm  usually  avoiding high-fat meals         Physical activity: exercise: regular walking, legs weaker recently         Dietician visit: Most recent: 2013   Weight control:  Wt Readings from Last 3 Encounters:  08/14/14 184 lb (83.462 kg)  07/02/14 168 lb (76.204 kg)  06/18/14 168 lb (76.204 kg)          Diabetes labs:  Lab Results  Component Value Date   HGBA1C 6.2 08/11/2014   HGBA1C 7.0* 05/01/2014   HGBA1C 8.2* 03/12/2014   Lab Results  Component Value Date   MICROALBUR 7.0* 09/08/2013   LDLCALC 50 09/08/2013   CREATININE 0.85 08/11/2014       Medication List       This list is accurate as of: 08/14/14 11:59 PM.  Always use your most recent med list.               ACCU-CHEK FASTCLIX LANCETS Misc  USE  TO CHECK BLOOD SUGAR THREE TIMES DAILY     ACCU-CHEK SMARTVIEW test strip  Generic drug:  glucose blood  USE AS DIRECTED  TO CHECK BLOOD SUGAR THREE TIMES DAILY     aspirin EC 81 MG tablet  Take 81 mg by  mouth every morning.     atenolol 50 MG tablet  Commonly known as:  TENORMIN     benazepril 20 MG tablet  Commonly known as:  LOTENSIN  Take 1 tablet (20 mg total) by mouth daily.     cetirizine 10 MG tablet  Commonly known as:  ZYRTEC  Take 10 mg by mouth daily.     clonazePAM 1 MG tablet  Commonly known as:  KLONOPIN  Take 1 tablet (1 mg total) by mouth 3 (three) times daily as needed for anxiety.     COD LIVER OIL PO  Take 1 capsule by mouth every morning.     docusate sodium 100 MG capsule  Commonly known as:  COLACE  Take 100 mg by mouth every morning.     Emtricitab-Rilpivir-Tenofov DF 200-25-300 MG Tabs  Take 1 tablet by mouth daily.     gabapentin 300 MG capsule  Commonly known as:  NEURONTIN  Take 1 capsule (300 mg total) by mouth 3 (three) times daily.     insulin aspart 100 UNIT/ML injection  Commonly known as:  novoLOG  Inject 20-25 Units into the skin 3 (three) times daily with meals.     insulin aspart 100 UNIT/ML FlexPen  Commonly  known as:  NOVOLOG FLEXPEN  Inject 20-25 units three times a day with meals     insulin glargine 100 unit/mL Sopn  Commonly known as:  LANTUS  Inject 60 Units into the skin 2 (two) times daily.     Insulin Pen Needle 31G X 8 MM Misc  Commonly known as:  B-D ULTRAFINE III SHORT PEN  Use to administer insulin Dx E11.9     Melatonin 3 MG Tabs  Take by mouth at bedtime.     multivitamin tablet  Take 1 tablet by mouth daily.     nebivolol 10 MG tablet  Commonly known as:  BYSTOLIC  Take 1 tablet (10 mg total) by mouth daily.     pioglitazone 30 MG tablet  Commonly known as:  ACTOS  Take 1 tablet (30 mg total) by mouth daily.     pravastatin 40 MG tablet  Commonly known as:  PRAVACHOL  Take 1 tablet (40 mg total) by mouth every evening.     sertraline 100 MG tablet  Commonly known as:  ZOLOFT  Take 1 tablet (100 mg total) by mouth daily before breakfast.     traZODone 100 MG tablet  Commonly known as:  DESYREL  Take 2 tablets (200 mg total) by mouth at bedtime.     triamcinolone 0.1 % cream : eucerin Crea  Apply 1 application topically 3 (three) times daily.        Allergies:  Allergies  Allergen Reactions  . Glipizide Nausea Only    Past Medical History  Diagnosis Date  . HIV infection   . Hypertension   . Diabetes mellitus without complication   . Depression   . Anxiety   . Hypercholesterolemia   . Cataract     right   . DM (diabetes mellitus screen)   . HIV disease 09/12/1995  . Hyperlipidemia     Past Surgical History  Procedure Laterality Date  . Eye surgery  2005    catarct surgery  . Optic lens surgery  2005  . Colonoscopy with propofol  01/16/2012    Procedure: COLONOSCOPY WITH PROPOFOL;  Surgeon: Garlan Fair, MD;  Location: [facility];  Service: Endoscopy;  Laterality: N/A;    Family History  Problem Relation Age of Onset  . Arthritis Mother   . Diabetes Mother   . Heart disease Mother   . Arthritis Father   . Diabetes Father   .  Heart disease Father     Social History:  reports that he quit smoking about 2 years ago. His smoking use included Cigarettes. He smoked 0.25 packs per day. He has never used smokeless tobacco. He reports that he does not drink alcohol or use illicit drugs.  Review of Systems:  He is asking about a painful area on the dorsum of his left hand for the last week or so  Hypertension:  currently on atenolol and back on benazepril  Lipids: LDL is controlled with taking pravastatin  Lab Results  Component Value Date   CHOL 104 09/08/2013   HDL 43.30 09/08/2013   LDLCALC 50 09/08/2013   TRIG 53.0 09/08/2013   CHOLHDL 2 09/08/2013    He has symptoms of neuropathy with paresthesia in feet and legs and symptoms are well controlled with 300 mg of gabapentin, 3 daily.   Depression: Not well-controlled, continues to take Zoloft    LABS:  Appointment on 08/11/2014  Component Date Value Ref Range Status  . Hgb A1c MFr Bld 08/11/2014 6.2  4.6 - 6.5 % Final   Glycemic Control Guidelines for People with Diabetes:Non Diabetic:  <6%Goal of Therapy: <7%Additional Action Suggested:  >8%   . Sodium 08/11/2014 143  135 - 145 mEq/L Final  . Potassium 08/11/2014 3.7  3.5 - 5.1 mEq/L Final  . Chloride 08/11/2014 108  96 - 112 mEq/L Final  . CO2 08/11/2014 31  19 - 32 mEq/L Final  . Glucose, Bld 08/11/2014 64* 70 - 99 mg/dL Final  . BUN 08/11/2014 10  6 - 23 mg/dL Final  . Creatinine, Ser 08/11/2014 0.85  0.40 - 1.50 mg/dL Final  . Calcium 08/11/2014 9.2  8.4 - 10.5 mg/dL Final  . GFR 08/11/2014 119.58  >60.00 mL/min Final     Examination:   BP 138/80 mmHg  Pulse 54  Temp(Src) 97.9 F (36.6 C)  Resp 16  Ht 6' (1.829 m)  Wt 184 lb (83.462 kg)  BMI 24.95 kg/m2  SpO2 94%  Body mass index is 24.95 kg/(m^2).   No pedal edema  ASSESSMENT/ PLAN:    Diabetes type 2:  Recent blood sugars are significantly improved with A1c now 6.2% without any hypoglycemia Although his weight is fluctuating it  is about the same as earlier this year This is despite taking large doses of insulin along with Actos  Since he tends to have low normal sugars at lunch and dinner will have him reduce his Lantus by 5 units in the morning Discussed that A1c is excellent and does not need aggressive control Also he can take his Lantus earlier in the evening for better compliance instead of bedtime  Needs rechecking his microalbumin next time  Hypertension: Followed by PCP, recently controlled  Patient Instructions  Lantus 55 in am and 60 in pm; take Lantus at dinner in pms           [facility] 08/16/2014, 5:04 PM

## 2014-08-14 NOTE — Patient Instructions (Signed)
Lantus 55 in am and 60 in pm; take Lantus at dinner in pms

## 2014-08-25 ENCOUNTER — Ambulatory Visit: Payer: Commercial Managed Care - HMO

## 2014-08-25 DIAGNOSIS — F32A Depression, unspecified: Secondary | ICD-10-CM

## 2014-08-25 DIAGNOSIS — F329 Major depressive disorder, single episode, unspecified: Secondary | ICD-10-CM

## 2014-08-25 NOTE — BH Specialist Note (Signed)
Nicholas Whitehead came in with a cane and stumbled a couple of times walking down the hall.  He was in a fairly good mood with minimal complaints today, unlike recent sessions.  He was able to laugh some and joke with me at times.  He talked about how he would like to find someone to be in a relationship with, but doesn't know how to make that happen.  He also talked briefly about a younger brother with whom he has no contact because of his rudeness.  Plan to meet again in 2 weeks.

## 2014-09-07 ENCOUNTER — Other Ambulatory Visit: Payer: Self-pay | Admitting: *Deleted

## 2014-09-07 ENCOUNTER — Other Ambulatory Visit: Payer: Self-pay

## 2014-09-07 DIAGNOSIS — IMO0002 Reserved for concepts with insufficient information to code with codable children: Secondary | ICD-10-CM

## 2014-09-07 DIAGNOSIS — F329 Major depressive disorder, single episode, unspecified: Secondary | ICD-10-CM

## 2014-09-07 DIAGNOSIS — F419 Anxiety disorder, unspecified: Secondary | ICD-10-CM

## 2014-09-07 DIAGNOSIS — F32A Depression, unspecified: Secondary | ICD-10-CM

## 2014-09-07 DIAGNOSIS — G4700 Insomnia, unspecified: Secondary | ICD-10-CM

## 2014-09-07 DIAGNOSIS — E1165 Type 2 diabetes mellitus with hyperglycemia: Secondary | ICD-10-CM

## 2014-09-07 MED ORDER — INSULIN GLARGINE 100 UNITS/ML SOLOSTAR PEN: PEN_INJECTOR | SUBCUTANEOUS | Status: DC

## 2014-09-07 MED ORDER — PRAVASTATIN SODIUM 40 MG PO TABS: 40.0000 mg | ORAL_TABLET | Freq: Every evening | ORAL | Status: DC

## 2014-09-07 MED ORDER — TRAZODONE HCL 100 MG PO TABS: 200.0000 mg | ORAL_TABLET | Freq: Every day | ORAL | Status: DC

## 2014-09-07 NOTE — Telephone Encounter (Signed)
OK #[id number] NR

## 2014-09-07 NOTE — Telephone Encounter (Signed)
Ok to rf? Dr. Jones out of office  

## 2014-09-08 ENCOUNTER — Ambulatory Visit: Payer: Commercial Managed Care - HMO

## 2014-09-08 DIAGNOSIS — F329 Major depressive disorder, single episode, unspecified: Secondary | ICD-10-CM

## 2014-09-08 DIAGNOSIS — F32A Depression, unspecified: Secondary | ICD-10-CM

## 2014-09-08 NOTE — BH Specialist Note (Signed)
Nicholas Whitehead was in a pleasant mood today, telling funny stories of interactions with neighbors, who he seems to enjoy joking with.  He mentioned that his father was a Art gallery manager and I commented that that must be where he got his humor from.  He reported that he finally got his [employer] pass, which took over 2 months to get.  Plan to meet in 3 weeks. Curley Spice, LCSW

## 2014-09-22 ENCOUNTER — Encounter: Payer: Self-pay | Admitting: Internal Medicine

## 2014-09-22 ENCOUNTER — Other Ambulatory Visit (INDEPENDENT_AMBULATORY_CARE_PROVIDER_SITE_OTHER): Payer: Commercial Managed Care - HMO

## 2014-09-22 ENCOUNTER — Ambulatory Visit (INDEPENDENT_AMBULATORY_CARE_PROVIDER_SITE_OTHER): Payer: Commercial Managed Care - HMO | Admitting: Internal Medicine

## 2014-09-22 VITALS — BP 154/80 | HR 50 | Temp 97.5°F | Wt 187.8 lb

## 2014-09-22 VITALS — BP 136/80 | HR 60 | Temp 97.8°F | Resp 16 | Ht 72.0 in | Wt 187.0 lb

## 2014-09-22 DIAGNOSIS — E785 Hyperlipidemia, unspecified: Secondary | ICD-10-CM

## 2014-09-22 DIAGNOSIS — E1140 Type 2 diabetes mellitus with diabetic neuropathy, unspecified: Secondary | ICD-10-CM

## 2014-09-22 DIAGNOSIS — B20 Human immunodeficiency virus [HIV] disease: Secondary | ICD-10-CM

## 2014-09-22 DIAGNOSIS — Z23 Encounter for immunization: Secondary | ICD-10-CM

## 2014-09-22 LAB — BASIC METABOLIC PANEL
BUN: 10 mg/dL (ref 6–23)
CHLORIDE: 105 meq/L (ref 96–112)
CO2: 32 meq/L (ref 19–32)
Calcium: 9.6 mg/dL (ref 8.4–10.5)
Creatinine, Ser: 0.8 mg/dL (ref 0.40–1.50)
GFR: 128.19 mL/min (ref >60.00)
Glucose, Bld: 79 mg/dL (ref 70–99)
POTASSIUM: 3.9 meq/L (ref 3.5–5.1)
Sodium: 143 mEq/L (ref 135–145)

## 2014-09-22 LAB — CBC
HEMATOCRIT: 46.2 % (ref 39.0–52.0)
HEMOGLOBIN: 16.2 g/dL (ref 13.0–17.0)
MCH: 33 pg (ref 26.0–34.0)
MCHC: 35.1 g/dL (ref 30.0–36.0)
MCV: 94.1 fL (ref 78.0–100.0)
MPV: 9.9 fL (ref 8.6–12.4)
Platelets: 127 10*3/uL — ABNORMAL LOW (ref 150–400)
RBC: 4.91 MIL/uL (ref 4.22–5.81)
RDW: 15.3 % (ref 11.5–15.5)
WBC: 6.7 10*3/uL (ref 4.0–10.5)

## 2014-09-22 LAB — COMPREHENSIVE METABOLIC PANEL
ALBUMIN: 4.4 g/dL (ref 3.6–5.1)
ALK PHOS: 40 U/L (ref 40–115)
ALT: 22 U/L (ref 9–46)
AST: 28 U/L (ref 10–35)
BILIRUBIN TOTAL: 0.3 mg/dL (ref 0.2–1.2)
BUN: 11 mg/dL (ref 7–25)
CALCIUM: 9.2 mg/dL (ref 8.6–10.3)
CO2: 27 mmol/L (ref 20–31)
Chloride: 105 mmol/L (ref 98–110)
Creat: 0.81 mg/dL (ref 0.70–1.33)
Glucose, Bld: 80 mg/dL (ref 65–99)
Potassium: 4 mmol/L (ref 3.5–5.3)
Sodium: 143 mmol/L (ref 135–146)
Total Protein: 7.9 g/dL (ref 6.1–8.1)

## 2014-09-22 LAB — URINALYSIS, ROUTINE W REFLEX MICROSCOPIC
BILIRUBIN URINE: NEGATIVE
KETONES UR: NEGATIVE
LEUKOCYTES UA: NEGATIVE
Nitrite: NEGATIVE
PH: 6 (ref 5.0–8.0)
Specific Gravity, Urine: 1.02 (ref 1.000–1.030)
Total Protein, Urine: NEGATIVE
Urine Glucose: NEGATIVE
Urobilinogen, UA: 0.2 (ref 0.0–1.0)

## 2014-09-22 LAB — LIPID PANEL
CHOL/HDL RATIO: 3
Cholesterol: 120 mg/dL (ref 0–200)
HDL: 42.7 mg/dL (ref >39.00)
LDL Cholesterol: 61 mg/dL (ref 0–99)
NonHDL: 77.14
TRIGLYCERIDES: 80 mg/dL (ref 0.0–149.0)
VLDL: 16 mg/dL (ref 0.0–40.0)

## 2014-09-22 LAB — TSH: TSH: 1.07 u[IU]/mL (ref 0.35–4.50)

## 2014-09-22 LAB — MICROALBUMIN / CREATININE URINE RATIO
CREATININE, U: 137 mg/dL
Microalb Creat Ratio: 4 mg/g (ref 0.0–30.0)
Microalb, Ur: 5.5 mg/dL — ABNORMAL HIGH (ref 0.0–1.9)

## 2014-09-22 MED ORDER — EMTRICITAB-RILPIVIR-TENOFOV AF 200-25-25 MG PO TABS: 1.0000 | ORAL_TABLET | Freq: Every day | ORAL | Status: DC

## 2014-09-22 NOTE — Progress Notes (Signed)
Patient ID: Nicholas Whitehead, male   DOB: 1955-01-22, 59 y.o.   MRN: [id number]          Patient Active Problem List   Diagnosis Date Noted  . HIV disease 09/12/1995    Priority: High  . Dermatitis 06/18/2014  . Long toenail 08/21/2013  . Screening examination for venereal disease 08/12/2013  . Routine general medical examination at a health care facility 05/21/2013  . BPH (benign prostatic hyperplasia) 05/21/2013  . 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 Anxiety and depression 04/18/2011  . Hyperlipidemia   . Diabetes mellitus with diabetic neuropathy   . Hypertension     Patient's Medications  New Prescriptions   EMTRICITABINE-RILPIVIR-TENOFOVIR AF (ODEFSEY) 200-25-25 MG TABS PER TABLET    Take 1 tablet by mouth daily.  Previous Medications   ACCU-CHEK FASTCLIX LANCETS MISC    USE  TO CHECK BLOOD SUGAR THREE TIMES DAILY   ACCU-CHEK SMARTVIEW TEST STRIP    USE AS DIRECTED  TO CHECK BLOOD SUGAR THREE TIMES DAILY   ASPIRIN EC 81 MG TABLET    Take 81 mg by mouth every morning.   ATENOLOL (TENORMIN) 50 MG TABLET       BENAZEPRIL (LOTENSIN) 20 MG TABLET    Take 1 tablet (20 mg total) by mouth daily.   CETIRIZINE (ZYRTEC) 10 MG TABLET    Take 10 mg by mouth daily.   CLONAZEPAM (KLONOPIN) 1 MG TABLET    Take 1 tablet (1 mg total) by mouth 3 (three) times daily as needed for anxiety.   COD LIVER OIL PO    Take 1 capsule by mouth every morning.   DOCUSATE SODIUM (COLACE) 100 MG CAPSULE    Take 100 mg by mouth every morning.   GABAPENTIN (NEURONTIN) 300 MG CAPSULE    Take 1 capsule (300 mg total) by mouth 3 (three) times daily.   INSULIN ASPART (NOVOLOG FLEXPEN) 100 UNIT/ML FLEXPEN    Inject 20-25 units three times a day with meals   INSULIN ASPART (NOVOLOG) 100 UNIT/ML INJECTION    Inject 20-25 Units into the skin 3 (three) times daily with meals.   INSULIN GLARGINE (LANTUS) 100 UNIT/ML SOPN    Inject 55 units in the am and 60 units in the pm   INSULIN PEN NEEDLE (B-D ULTRAFINE III SHORT PEN) 31G  X 8 MM MISC    Use to administer insulin Dx E11.9   MELATONIN 3 MG TABS    Take by mouth at bedtime.   MULTIPLE VITAMIN (MULTIVITAMIN) TABLET    Take 1 tablet by mouth daily.     NEBIVOLOL (BYSTOLIC) 10 MG TABLET    Take 1 tablet (10 mg total) by mouth daily.   PIOGLITAZONE (ACTOS) 30 MG TABLET    Take 1 tablet (30 mg total) by mouth daily.   PRAVASTATIN (PRAVACHOL) 40 MG TABLET    Take 1 tablet (40 mg total) by mouth every evening.   SERTRALINE (ZOLOFT) 100 MG TABLET    Take 1 tablet (100 mg total) by mouth daily before breakfast.   TRAZODONE (DESYREL) 100 MG TABLET    Take 2 tablets (200 mg total) by mouth at bedtime.   TRIAMCINOLONE ACETONIDE (TRIAMCINOLONE 0.1 % CREAM : EUCERIN) CREA    Apply 1 application topically 3 (three) times daily.  Modified Medications   No medications on file  Discontinued Medications   EMTRICITAB-RILPIVIR-TENOFOVIR 200-25-300 MG TABS    Take 1 tablet by mouth daily.    Subjective: Nicholas Whitehead is in for his  routine HIV follow-up visit. He denies missing any doses of his Complera. He is tolerating it well. He takes it each morning right after breakfast. He continues to see Curley Spice, our behavioral health counselor, for depression and feels that that is helpful. He has been using the Eucerin cream and his dermatitis has improved.  Review of Systems: Pertinent items are noted in HPI.  Past Medical History  Diagnosis Date  . HIV infection   . Hypertension   . Diabetes mellitus without complication   . Depression   . Anxiety   . Hypercholesterolemia   . Cataract     right   . DM (diabetes mellitus screen)   . HIV disease 09/12/1995  . Hyperlipidemia     Social History  Substance Use Topics  . Smoking status: Current Some Day Smoker -- 0.25 packs/day    Types: Cigarettes    Last Attempt to Quit: 01/09/2012  . Smokeless tobacco: Never Used  . Alcohol Use: No     Comment: socially    Family History  Problem Relation Age of Onset  . Arthritis Mother     . Diabetes Mother   . Heart disease Mother   . Arthritis Father   . Diabetes Father   . Heart disease Father     Allergies  Allergen Reactions  . Glipizide Nausea Only    Objective:  Filed Vitals:   09/22/14 1109  BP: 154/80  Pulse: 50  Temp: 97.5 F (36.4 C)  TempSrc: Oral  Weight: 187 lb 12 oz (85.163 kg)   Body mass index is 25.46 kg/(m^2).  General: He is in good spirits Oral: No oropharyngeal lesions Skin: His chronic dermatitis on his forearms and hands is much better Lungs: Clear Cor: Regular S1 and S2 with no murmurs Mood: Normal. He does not appear anxious or depressed  Lab Results Lab Results  Component Value Date   WBC 6.4 04/29/2014   HGB 14.6 04/29/2014   HCT 43.0 04/29/2014   MCV 95.8 04/29/2014   PLT 136* 04/29/2014    Lab Results  Component Value Date   CREATININE 0.85 08/11/2014   BUN 10 08/11/2014   NA 143 08/11/2014   K 3.7 08/11/2014   CL 108 08/11/2014   CO2 31 08/11/2014    Lab Results  Component Value Date   ALT 29 05/01/2014   AST 43* 05/01/2014   ALKPHOS 46 05/01/2014   BILITOT 0.4 05/01/2014    Lab Results  Component Value Date   CHOL 104 09/08/2013   HDL 43.30 09/08/2013   LDLCALC 50 09/08/2013   TRIG 53.0 09/08/2013   CHOLHDL 2 09/08/2013    Lab Results HIV 1 RNA QUANT (copies/mL)  Date Value  04/29/2014 <20  12/11/2013 <20  07/30/2013 <20   CD4 T CELL ABS (/uL)  Date Value  04/29/2014 450  12/11/2013 840  07/30/2013 680     Problem List Items Addressed This Visit      High   HIV disease    His HIV infection remains under excellent control. I will change Complera to the new, safer version called Odefsey. He can take that each morning after breakfast. I will repeat lab work today and have him follow-up in 6 months.      Relevant Medications   emtricitabine-rilpivir-tenofovir AF (ODEFSEY) 200-25-25 MG TABS per tablet   Other Relevant Orders   T-helper cell (CD4)- ([facility] clinic only)   HIV 1 RNA  quant-no reflex-bld   CBC   Comprehensive metabolic  panel        Michel Bickers, [place] [facility] [phone] pager   [phone] cell 09/22/2014, 11:46 AM

## 2014-09-22 NOTE — Patient Instructions (Signed)

## 2014-09-22 NOTE — Progress Notes (Signed)
Pre visit review using our clinic review tool, if applicable. No additional management support is needed unless otherwise documented below in the visit note. 

## 2014-09-22 NOTE — Progress Notes (Signed)
Subjective:  Patient ID: Nicholas Whitehead, male    DOB: [birth year]  Age: 59 y.o. MRN: [id number]  CC: Hypertension; Diabetes; and Hyperlipidemia   HPI Nicholas Whitehead presents for follow-up. He offers no new or different complaints today.  Outpatient Prescriptions Prior to Visit  Medication Sig Dispense Refill  . ACCU-CHEK FASTCLIX LANCETS MISC USE  TO CHECK BLOOD SUGAR THREE TIMES DAILY 306 each 1  . ACCU-CHEK SMARTVIEW test strip USE AS DIRECTED  TO CHECK BLOOD SUGAR THREE TIMES DAILY 300 each 1  . aspirin EC 81 MG tablet Take 81 mg by mouth every morning.    Marland Kitchen atenolol (TENORMIN) 50 MG tablet   1  . benazepril (LOTENSIN) 20 MG tablet Take 1 tablet (20 mg total) by mouth daily. 90 tablet 3  . cetirizine (ZYRTEC) 10 MG tablet Take 10 mg by mouth daily.    . clonazePAM (KLONOPIN) 1 MG tablet Take 1 tablet (1 mg total) by mouth 3 (three) times daily as needed for anxiety. 75 tablet 3  . COD LIVER OIL PO Take 1 capsule by mouth every morning.    . docusate sodium (COLACE) 100 MG capsule Take 100 mg by mouth every morning.    Marland Kitchen emtricitabine-rilpivir-tenofovir AF (ODEFSEY) 200-25-25 MG TABS per tablet Take 1 tablet by mouth daily. 30 tablet 11  . gabapentin (NEURONTIN) 300 MG capsule Take 1 capsule (300 mg total) by mouth 3 (three) times daily. 90 capsule 3  . insulin aspart (NOVOLOG FLEXPEN) 100 UNIT/ML FlexPen Inject 20-25 units three times a day with meals 30 mL 3  . insulin aspart (NOVOLOG) 100 UNIT/ML injection Inject 20-25 Units into the skin 3 (three) times daily with meals. 30 mL 1  . insulin glargine (LANTUS) 100 unit/mL SOPN Inject 55 units in the am and 60 units in the pm 30 mL 3  . Insulin Pen Needle (B-D ULTRAFINE III SHORT PEN) 31G X 8 MM MISC Use to administer insulin Dx E11.9 300 each 3  . Melatonin 3 MG TABS Take by mouth at bedtime.    . Multiple Vitamin (MULTIVITAMIN) tablet Take 1 tablet by mouth daily.      . nebivolol (BYSTOLIC) 10 MG tablet Take 1 tablet (10 mg total) by  mouth daily. 30 tablet 11  . pioglitazone (ACTOS) 30 MG tablet Take 1 tablet (30 mg total) by mouth daily. 30 tablet 3  . pravastatin (PRAVACHOL) 40 MG tablet Take 1 tablet (40 mg total) by mouth every evening. 30 tablet 4  . sertraline (ZOLOFT) 100 MG tablet Take 1 tablet (100 mg total) by mouth daily before breakfast. 30 tablet 11  . traZODone (DESYREL) 100 MG tablet Take 2 tablets (200 mg total) by mouth at bedtime. 60 tablet 0  . Triamcinolone Acetonide (TRIAMCINOLONE 0.1 % CREAM : EUCERIN) CREA Apply 1 application topically 3 (three) times daily. 1 each 3   No facility-administered medications prior to visit.    ROS Review of Systems  Constitutional: Negative.  Negative for fever, chills, diaphoresis, appetite change and fatigue.  HENT: Negative.  Negative for trouble swallowing.   Eyes: Negative.   Respiratory: Negative.  Negative for cough, choking, shortness of breath and stridor.   Cardiovascular: Negative.  Negative for chest pain, palpitations and leg swelling.  Gastrointestinal: Negative.  Negative for nausea, vomiting, abdominal pain, diarrhea, constipation and blood in stool.  Endocrine: Negative.   Genitourinary: Negative.   Musculoskeletal: Negative.  Negative for myalgias, back pain, joint swelling and arthralgias.  Skin: Negative.  Negative  for rash.  Allergic/Immunologic: Negative.   Neurological: Negative.  Negative for dizziness, tremors, light-headedness, numbness and headaches.  Hematological: Negative.  Negative for adenopathy. Does not bruise/bleed easily.  Psychiatric/Behavioral: Negative for sleep disturbance, self-injury, dysphoric mood, decreased concentration and agitation. The patient is nervous/anxious.     Objective:  BP 136/80 mmHg  Pulse 60  Temp(Src) 97.8 F (36.6 C) (Oral)  Resp 16  Ht 6' (1.829 m)  Wt 187 lb (84.823 kg)  BMI 25.36 kg/m2  SpO2 97%  BP Readings from Last 3 Encounters:  09/22/14 136/80  09/22/14 154/80  08/14/14 138/80     Wt Readings from Last 3 Encounters:  09/22/14 187 lb (84.823 kg)  09/22/14 187 lb 12 oz (85.163 kg)  08/14/14 184 lb (83.462 kg)    Physical Exam  Constitutional: He is oriented to person, place, and time. No distress.  HENT:  Mouth/Throat: Oropharynx is clear and moist. No oropharyngeal exudate.  Eyes: Conjunctivae are normal. Right eye exhibits no discharge. Left eye exhibits no discharge. No scleral icterus.  Neck: Normal range of motion. Neck supple. No JVD present. No tracheal deviation present. No thyromegaly present.  Cardiovascular: Normal rate, regular rhythm, normal heart sounds and intact distal pulses.  Exam reveals no gallop and no friction rub.   No murmur heard. Pulmonary/Chest: Effort normal and breath sounds normal. No stridor. No respiratory distress. He has no wheezes. He has no rales. He exhibits no tenderness.  Abdominal: Soft. Bowel sounds are normal. He exhibits no distension and no mass. There is no tenderness. There is no rebound and no guarding.  Musculoskeletal: Normal range of motion. He exhibits no edema or tenderness.  Lymphadenopathy:    He has no cervical adenopathy.  Neurological: He is oriented to person, place, and time.  Skin: Skin is warm and dry. No rash noted. He is not diaphoretic. No erythema. No pallor.  Vitals reviewed.   Lab Results  Component Value Date   WBC 6.7 09/22/2014   HGB 16.2 09/22/2014   HCT 46.2 09/22/2014   PLT 127* 09/22/2014   GLUCOSE 79 09/22/2014   CHOL 120 09/22/2014   TRIG 80.0 09/22/2014   HDL 42.70 09/22/2014   LDLCALC 61 09/22/2014   ALT 22 09/22/2014   AST 28 09/22/2014   NA 143 09/22/2014   K 3.9 09/22/2014   CL 105 09/22/2014   CREATININE 0.80 09/22/2014   BUN 10 09/22/2014   CO2 32 09/22/2014   TSH 1.07 09/22/2014   PSA 0.71 05/21/2013   HGBA1C 6.2 08/11/2014   MICROALBUR 5.5* 09/22/2014    No results found.  Assessment & Plan:   Nicholas Whitehead was seen today for hypertension, diabetes and  hyperlipidemia.  Diagnoses and all orders for this visit:  Type 2 diabetes mellitus with diabetic neuropathy- his blood sugars are well-controlled and his renal function is stable. -     Lipid panel; Future -     Basic metabolic panel; Future -     Microalbumin / creatinine urine ratio; Future -     Urinalysis, Routine w reflex microscopic (not at [facility]); Future  Hyperlipidemia- he has achieved his LDL goal and is doing well on the statin. -     Lipid panel; Future -     TSH; Future   I am having Mr. Spieker maintain his multivitamin, cetirizine, aspirin EC, COD LIVER OIL PO, docusate sodium, Melatonin, sertraline, Insulin Pen Needle, benazepril, insulin aspart, triamcinolone 0.1 % cream : eucerin, ACCU-CHEK FASTCLIX LANCETS, ACCU-CHEK SMARTVIEW, nebivolol, pioglitazone,  clonazePAM, gabapentin, atenolol, insulin aspart, pravastatin, traZODone, insulin glargine, emtricitabine-rilpivir-tenofovir AF, and COMPLERA.  Meds ordered this encounter  Medications  . COMPLERA 200-25-300 MG TABS    Sig:     Refill:  1     Follow-up: Return in about 6 months (around 03/22/2015).  Scarlette Calico, MD

## 2014-09-22 NOTE — Assessment & Plan Note (Signed)
His HIV infection remains under excellent control. I will change Complera to the new, safer version called Odefsey. He can take that each morning after breakfast. I will repeat lab work today and have him follow-up in 6 months.

## 2014-09-23 ENCOUNTER — Encounter: Payer: Self-pay | Admitting: Internal Medicine

## 2014-09-23 ENCOUNTER — Other Ambulatory Visit: Payer: Self-pay | Admitting: Internal Medicine

## 2014-09-23 LAB — HIV-1 RNA QUANT-NO REFLEX-BLD
HIV 1 RNA Quant: <20 copies/mL (ref <20)
HIV-1 RNA Quant, Log: <1.3 {Log} (ref <1.30)

## 2014-09-24 LAB — T-HELPER CELL (CD4) - (RCID CLINIC ONLY)
CD4 % Helper T Cell: 34 % (ref 33–55)
CD4 T Cell Abs: 600 /uL (ref 400–2700)

## 2014-09-29 ENCOUNTER — Ambulatory Visit: Payer: Commercial Managed Care - HMO

## 2014-09-30 ENCOUNTER — Ambulatory Visit: Payer: Commercial Managed Care - HMO

## 2014-09-30 DIAGNOSIS — F32A Depression, unspecified: Secondary | ICD-10-CM

## 2014-09-30 DIAGNOSIS — F329 Major depressive disorder, single episode, unspecified: Secondary | ICD-10-CM

## 2014-09-30 NOTE — BH Specialist Note (Signed)
Nicholas Whitehead was in a pleasant mood today, reporting that he had a really nice weekend, attending a service in [place], where his brother was installed as a deacon in his church.  He said it was exhausting and he came back and slept for 2 days, but he was glad he went.  He was able to laugh some today and said his mood has been much better lately.   Plan to meet again in 2 weeks. Curley Spice, LCSW

## 2014-10-14 ENCOUNTER — Ambulatory Visit: Payer: Commercial Managed Care - HMO

## 2014-10-14 DIAGNOSIS — F32A Depression, unspecified: Secondary | ICD-10-CM

## 2014-10-14 DIAGNOSIS — F329 Major depressive disorder, single episode, unspecified: Secondary | ICD-10-CM

## 2014-10-14 NOTE — BH Specialist Note (Signed)
Nicholas Whitehead was in a good mood again today, reporting that he and some old friends got together over the weekend at a [employer] and laughed and had a good time, in part to celebrate some of their birthdays, including his later this month.  He shared some of the comical things they said and it seemed to lift his spirits to reconnect with these friends.  I expressed that this is a good thing and encouraged more of it.  No major concerns today.  Plan to meet in 2 weeks. Curley Spice, LCSW

## 2014-10-21 ENCOUNTER — Encounter: Payer: Self-pay | Admitting: Internal Medicine

## 2014-10-28 ENCOUNTER — Ambulatory Visit: Payer: Commercial Managed Care - HMO

## 2014-10-28 DIAGNOSIS — F32A Depression, unspecified: Secondary | ICD-10-CM

## 2014-10-28 DIAGNOSIS — F329 Major depressive disorder, single episode, unspecified: Secondary | ICD-10-CM

## 2014-10-28 NOTE — BH Specialist Note (Signed)
Nicholas Whitehead was in a pleasant mood today, but said that he needed to come in today because his birthday is next week and he always gets a bit depressed around his birthday.  We discussed "seasonal affective disorder" and he agreed that he always gets more depressed in the fall/winter months.  I explained about phototherapy and recommended he spend at least 30 minutes a day outside, even if it is cloudy.  Plan to meet in 2 weeks. Curley Spice, LCSW

## 2014-10-31 ENCOUNTER — Other Ambulatory Visit: Payer: Self-pay | Admitting: Endocrinology

## 2014-11-11 ENCOUNTER — Telehealth: Payer: Self-pay | Admitting: *Deleted

## 2014-11-11 NOTE — Telephone Encounter (Signed)
Pt states he would like to schedule an appt to get his toenails clipped.

## 2014-11-12 ENCOUNTER — Ambulatory Visit: Payer: Commercial Managed Care - HMO

## 2014-11-12 DIAGNOSIS — F32A Depression, unspecified: Secondary | ICD-10-CM

## 2014-11-12 DIAGNOSIS — F329 Major depressive disorder, single episode, unspecified: Secondary | ICD-10-CM

## 2014-11-12 NOTE — BH Specialist Note (Signed)
Nicholas Whitehead was 30 minutes late today due to the [employer] transportation running behind.  He was in a good mood today, reporting that some friends took him out to dinner for his birthday and they laughed and had a good time.  He said his neighbors also acknowledged his birthday and that made him feel good.  He said he has been doing well lately.  Plan to meet again in 2 weeks. Curley Spice, LCSW

## 2014-11-16 ENCOUNTER — Other Ambulatory Visit (INDEPENDENT_AMBULATORY_CARE_PROVIDER_SITE_OTHER): Payer: Commercial Managed Care - HMO

## 2014-11-16 ENCOUNTER — Other Ambulatory Visit: Payer: Self-pay

## 2014-11-16 DIAGNOSIS — F411 Generalized anxiety disorder: Secondary | ICD-10-CM

## 2014-11-16 DIAGNOSIS — E119 Type 2 diabetes mellitus without complications: Secondary | ICD-10-CM

## 2014-11-16 LAB — COMPREHENSIVE METABOLIC PANEL
ALBUMIN: 3.8 g/dL (ref 3.5–5.2)
ALT: 20 U/L (ref 0–53)
AST: 26 U/L (ref 0–37)
Alkaline Phosphatase: 50 U/L (ref 39–117)
BUN: 11 mg/dL (ref 6–23)
CALCIUM: 9.2 mg/dL (ref 8.4–10.5)
CHLORIDE: 107 meq/L (ref 96–112)
CO2: 29 meq/L (ref 19–32)
Creatinine, Ser: 0.8 mg/dL (ref 0.40–1.50)
GFR: 128.13 mL/min (ref >60.00)
Glucose, Bld: 126 mg/dL — ABNORMAL HIGH (ref 70–99)
Potassium: 3.8 mEq/L (ref 3.5–5.1)
Sodium: 142 mEq/L (ref 135–145)
Total Bilirubin: 0.3 mg/dL (ref 0.2–1.2)
Total Protein: 7.4 g/dL (ref 6.0–8.3)

## 2014-11-16 LAB — HEMOGLOBIN A1C: Hgb A1c MFr Bld: 6.4 % (ref 4.6–6.5)

## 2014-11-16 LAB — LIPID PANEL
CHOL/HDL RATIO: 3
CHOLESTEROL: 112 mg/dL (ref 0–200)
HDL: 35.4 mg/dL — AB (ref >39.00)
LDL CALC: 56 mg/dL (ref 0–99)
NonHDL: 76.99
TRIGLYCERIDES: 103 mg/dL (ref 0.0–149.0)
VLDL: 20.6 mg/dL (ref 0.0–40.0)

## 2014-11-16 MED ORDER — CLONAZEPAM 1 MG PO TABS: 1.0000 mg | ORAL_TABLET | Freq: Three times a day (TID) | ORAL | Status: DC | PRN

## 2014-11-16 NOTE — Telephone Encounter (Signed)
Incoming fax for pended med.  

## 2014-11-18 ENCOUNTER — Other Ambulatory Visit: Payer: Self-pay | Admitting: Endocrinology

## 2014-11-19 ENCOUNTER — Encounter: Payer: Self-pay | Admitting: Endocrinology

## 2014-11-19 ENCOUNTER — Ambulatory Visit (INDEPENDENT_AMBULATORY_CARE_PROVIDER_SITE_OTHER): Payer: Commercial Managed Care - HMO | Admitting: Endocrinology

## 2014-11-19 VITALS — BP 138/90 | HR 60 | Temp 98.3°F | Resp 16 | Ht 72.0 in | Wt 188.2 lb

## 2014-11-19 DIAGNOSIS — E785 Hyperlipidemia, unspecified: Secondary | ICD-10-CM

## 2014-11-19 DIAGNOSIS — I10 Essential (primary) hypertension: Secondary | ICD-10-CM | POA: Diagnosis not present

## 2014-11-19 DIAGNOSIS — Z794 Long term (current) use of insulin: Secondary | ICD-10-CM | POA: Diagnosis not present

## 2014-11-19 DIAGNOSIS — E1165 Type 2 diabetes mellitus with hyperglycemia: Secondary | ICD-10-CM

## 2014-11-19 LAB — MICROALBUMIN / CREATININE URINE RATIO
CREATININE, U: 162.1 mg/dL
MICROALB UR: 13 mg/dL — AB (ref 0.0–1.9)
Microalb Creat Ratio: 8 mg/g (ref 0.0–30.0)

## 2014-11-19 NOTE — Patient Instructions (Addendum)
Novolog 25 for larger meals  Extra 6-8 for dessert at nite  Check blood sugars on waking up 3  times a week Also check blood sugars about 2 hours after a meal and do this after different meals by rotation  Recommended blood sugar levels on waking up is 90-130 and about 2 hours after meal is 130-160  Please bring your blood sugar monitor to each visit, thank you  Take 2 Benazepril daily and only 1/2 atenolol, stay on Bystolic

## 2014-11-19 NOTE — Progress Notes (Signed)
Patient ID: Nicholas Whitehead, male   DOB: November 23, 1955, 59 y.o.   MRN: [id number]   Reason for Appointment: Type II Diabetes follow-up   History of Present Illness   Diagnosis date: 1998    Previous history: He was started on oral agents at diagnosis and around 2006 was put on insulin His blood sugars are usually well controlled when he takes Actos along with his insulin. He has been on Invokana since 04/2013  Recent history:   Insulin regimen: Lantus 55 units a.m. 60 units p.m., Novolog  20 AC tid         He is on a regimen of basal bolus insulin with Actos. He has usually been requiring large doses of insulin especially  Lantus which he takes twice a day  With this his blood sugars are significantly improved and A1c is below 7 usually In 12/15 because of complaints of frequent urination and urgency his Anastasio Auerbach was stopped  Since about 2016 he has done much better with his insulin compliance and diet A1c is down to 6.2 which is about the best he has had On his last visit his morning Lantus insulin dose was reduced because of relatively low sugars during the daytime Other doses were unchanged  He is very compliant with his insulin doses but  checks his blood sugar somewhat erratically  Current blood sugar patterns and problems:  His blood sugars are overall fairly stable throughout the day with only occasional fluctuations  Fasting blood sugars are only sporadically high  Highest blood sugars are after supper but he is not checking them recently.  Generally blood sugars are higher when he eats dessert later at night without any insulin coverage  Has only one high reading in the morning from forgetting to take his Lantus the night before  He does not exercise regularly but his weight is stable  Taking  consistent doses of Novolog of 20 units at each meal since he finds this regimen simpler to follow  No hypoglycemia  Oral hypoglycemic drugs: Actos 30 mg    Side  effects from medications: None Proper timing of medications in relation to meals: Yes.          Monitors blood glucose:  1-2 times a day  Glucometer:  Accu-Chek  Mean values apply above for all meters except median for One Touch  PRE-MEAL Fasting Lunch Dinner Bedtime Overall  Glucose range:  94-200   124-182    113-204    Mean/median:  137   137    170  144           Meals: 3 meals per day. Lunch 2-3pm  usually avoiding high-fat meals, dinner 6 pm        Physical activity: exercise: irregular walking, legs weak          Dietician visit: Most recent: 2013   Weight control:  Wt Readings from Last 3 Encounters:  11/19/14 188 lb 3.2 oz (85.367 kg)  09/22/14 187 lb (84.823 kg)  09/22/14 187 lb 12 oz (85.163 kg)          Diabetes labs:  Lab Results  Component Value Date   HGBA1C 6.4 11/16/2014   HGBA1C 6.2 08/11/2014   HGBA1C 7.0* 05/01/2014   Lab Results  Component Value Date   MICROALBUR 5.5* 09/22/2014   LDLCALC 56 11/16/2014   CREATININE 0.80 11/16/2014       Medication List       This list is accurate  as of: 11/19/14 11:59 PM.  Always use your most recent med list.               ACCU-CHEK FASTCLIX LANCETS Misc  USE  TO CHECK BLOOD SUGAR THREE TIMES DAILY     ACCU-CHEK SMARTVIEW test strip  Generic drug:  glucose blood  USE AS DIRECTED  TO CHECK BLOOD SUGAR THREE TIMES DAILY     aspirin EC 81 MG tablet  Take 81 mg by mouth every morning.     atenolol 50 MG tablet  Commonly known as:  TENORMIN     benazepril 20 MG tablet  Commonly known as:  LOTENSIN  Take 1 tablet (20 mg total) by mouth daily.     cetirizine 10 MG tablet  Commonly known as:  ZYRTEC  Take 10 mg by mouth daily.     clonazePAM 1 MG tablet  Commonly known as:  KLONOPIN  Take 1 tablet (1 mg total) by mouth 3 (three) times daily as needed for anxiety.     COD LIVER OIL PO  Take 1 capsule by mouth every morning.     COMPLERA 200-25-300 MG Tabs  Generic drug:   Emtricitab-Rilpivir-Tenofov DF     docusate sodium 100 MG capsule  Commonly known as:  COLACE  Take 100 mg by mouth every morning.     emtricitabine-rilpivir-tenofovir AF 200-25-25 MG Tabs tablet  Commonly known as:  ODEFSEY  Take 1 tablet by mouth daily.     gabapentin 300 MG capsule  Commonly known as:  NEURONTIN  take 1 capsule by mouth three times a day     insulin aspart 100 UNIT/ML FlexPen  Commonly known as:  NOVOLOG FLEXPEN  Inject 20-25 units three times a day with meals     insulin glargine 100 unit/mL Sopn  Commonly known as:  LANTUS  Inject 55 units in the am and 60 units in the pm     Insulin Pen Needle 31G X 8 MM Misc  Commonly known as:  B-D ULTRAFINE III SHORT PEN  Use to administer insulin Dx E11.9     Melatonin 3 MG Tabs  Take by mouth at bedtime.     multivitamin tablet  Take 1 tablet by mouth daily.     nebivolol 10 MG tablet  Commonly known as:  BYSTOLIC  Take 1 tablet (10 mg total) by mouth daily.     pioglitazone 30 MG tablet  Commonly known as:  ACTOS  take 1 tablet by mouth once daily     pravastatin 40 MG tablet  Commonly known as:  PRAVACHOL  Take 1 tablet (40 mg total) by mouth every evening.     sertraline 100 MG tablet  Commonly known as:  ZOLOFT  Take 1 tablet (100 mg total) by mouth daily before breakfast.     traZODone 100 MG tablet  Commonly known as:  DESYREL  Take 2 tablets (200 mg total) by mouth at bedtime.     triamcinolone 0.1 % cream : eucerin Crea  Apply 1 application topically 3 (three) times daily.        Allergies:  Allergies  Allergen Reactions  . Glipizide Nausea Only    Past Medical History  Diagnosis Date  . HIV infection ([place])   . Hypertension   . Diabetes mellitus without complication ([place])   . Depression   . Anxiety   . Hypercholesterolemia   . Cataract     right   . DM (diabetes mellitus screen)   .  HIV disease ([place]) 09/12/1995  . Hyperlipidemia     Past Surgical History  Procedure  Laterality Date  . Eye surgery  2005    catarct surgery  . Optic lens surgery  2005  . Colonoscopy with propofol  01/16/2012    Procedure: COLONOSCOPY WITH PROPOFOL;  Surgeon: Garlan Fair, MD;  Location: [facility];  Service: Endoscopy;  Laterality: N/A;    Family History  Problem Relation Age of Onset  . Arthritis Mother   . Diabetes Mother   . Heart disease Mother   . Arthritis Father   . Diabetes Father   . Heart disease Father     Social History:  reports that he has been smoking Cigarettes.  He has been smoking about 0.25 packs per day. He has never used smokeless tobacco. He reports that he does not drink alcohol or use illicit drugs.  Review of Systems:   Hypertension:  currently on atenolol and  on benazepril, not clear why Bystolic was also started and he is taking to beta blockers now  Lipids: LDL is controlled with taking pravastatin HDL still low  Lab Results  Component Value Date   CHOL 112 11/16/2014   HDL 35.40* 11/16/2014   LDLCALC 56 11/16/2014   TRIG 103.0 11/16/2014   CHOLHDL 3 11/16/2014    He has symptoms of neuropathy with paresthesia in feet and legs  Recently symptoms are well controlled with 300 mg of gabapentin, 3 daily.   Depression: Not well-controlled, continues to take Zoloft and trazodone    LABS:  Appointment on 11/16/2014  Component Date Value Ref Range Status  . Hgb A1c MFr Bld 11/16/2014 6.4  4.6 - 6.5 % Final   Glycemic Control Guidelines for People with Diabetes:Non Diabetic:  <6%Goal of Therapy: <7%Additional Action Suggested:  >8%   . Sodium 11/16/2014 142  135 - 145 mEq/L Final  . Potassium 11/16/2014 3.8  3.5 - 5.1 mEq/L Final  . Chloride 11/16/2014 107  96 - 112 mEq/L Final  . CO2 11/16/2014 29  19 - 32 mEq/L Final  . Glucose, Bld 11/16/2014 126* 70 - 99 mg/dL Final  . BUN 11/16/2014 11  6 - 23 mg/dL Final  . Creatinine, Ser 11/16/2014 0.80  0.40 - 1.50 mg/dL Final  . Total Bilirubin 11/16/2014 0.3  0.2 - 1.2 mg/dL  Final  . Alkaline Phosphatase 11/16/2014 50  39 - 117 U/L Final  . AST 11/16/2014 26  0 - 37 U/L Final  . ALT 11/16/2014 20  0 - 53 U/L Final  . Total Protein 11/16/2014 7.4  6.0 - 8.3 g/dL Final  . Albumin 11/16/2014 3.8  3.5 - 5.2 g/dL Final  . Calcium 11/16/2014 9.2  8.4 - 10.5 mg/dL Final  . GFR 11/16/2014 128.13  >60.00 mL/min Final  . Cholesterol 11/16/2014 112  0 - 200 mg/dL Final   ATP III Classification       Desirable:  < 200 mg/dL               Borderline High:  200 - 239 mg/dL          High:  > = 240 mg/dL  . Triglycerides 11/16/2014 103.0  0.0 - 149.0 mg/dL Final   Normal:  <150 mg/dLBorderline High:  150 - 199 mg/dL  . HDL 11/16/2014 35.40* >39.00 mg/dL Final  . VLDL 11/16/2014 20.6  0.0 - 40.0 mg/dL Final  . LDL Cholesterol 11/16/2014 56  0 - 99 mg/dL Final  . Total CHOL/HDL  Ratio 11/16/2014 3   Final                  Men          Women1/2 Average Risk     3.4          3.3Average Risk          5.0          4.42X Average Risk          9.6          7.13X Average Risk          15.0          11.0                      . NonHDL 11/16/2014 76.99   Final   NOTE:  Non-HDL goal should be 30 mg/dL higher than patient's LDL goal (i.e. LDL goal of < 70 mg/dL, would have non-HDL goal of < 100 mg/dL)     Examination:   BP 138/90 mmHg  Pulse 60  Temp(Src) 98.3 F (36.8 C)  Resp 16  Ht 6' (1.829 m)  Wt 188 lb 3.2 oz (85.367 kg)  BMI 25.52 kg/m2  SpO2 94%  Body mass index is 25.52 kg/(m^2).   No ankle edema  Repeat blood pressure 158/82  ASSESSMENT/ PLAN:    Diabetes type 2:  See history of present illness for detailed discussion of his current management, blood sugar patterns and problems identified His A1c is excellent at 6.4 and fairly stable now. Does very well when he takes Actos with his insulin Still requiring large doses of basal insulin but no hypoglycemia Morning Lantus was reduced previously to avoid low normal readings during the day He is not able to follow any  complicated regimen of mealtime doses or carbohydrate counting and is taking the same aren't of insulin regardless of what he is eating  Also not checking many readings after her evening meal or other meals Most of his high readings after supper are related to eating dessert without any external Novolog coverage Also has not been trying to exercise as much  For now he will continue the same basic regimen but take extra 5 units Novolog larger meal than additional 6-8 units for his dessert at night.  Discussed timing of insulin  Needs rechecking his microalbumin next time  Hypertension: Followed by PCP, discussed that since he is on 2 beta blockers taken reduce his atenolol for now and increase benazepril since blood pressure is high   Patient Instructions  Novolog 25 for larger meals  Extra 6-8 for dessert at nite  Check blood sugars on waking up 3  times a week Also check blood sugars about 2 hours after a meal and do this after different meals by rotation  Recommended blood sugar levels on waking up is 90-130 and about 2 hours after meal is 130-160  Please bring your blood sugar monitor to each visit, thank you  Take 2 Benazepril daily and only 1/2 atenolol, stay on Bystolic    Counseling time on subjects discussed above is over 50% of today's 25 minute visit    Jeziah Kretschmer 11/22/2014, 3:32 PM

## 2014-11-23 ENCOUNTER — Ambulatory Visit (INDEPENDENT_AMBULATORY_CARE_PROVIDER_SITE_OTHER): Payer: Commercial Managed Care - HMO | Admitting: Podiatry

## 2014-11-23 DIAGNOSIS — M79673 Pain in unspecified foot: Secondary | ICD-10-CM | POA: Diagnosis not present

## 2014-11-23 DIAGNOSIS — B351 Tinea unguium: Secondary | ICD-10-CM | POA: Diagnosis not present

## 2014-11-23 DIAGNOSIS — Q828 Other specified congenital malformations of skin: Secondary | ICD-10-CM | POA: Diagnosis not present

## 2014-11-23 DIAGNOSIS — E1151 Type 2 diabetes mellitus with diabetic peripheral angiopathy without gangrene: Secondary | ICD-10-CM

## 2014-11-23 NOTE — Progress Notes (Signed)
Subjective:     Patient ID: Nicholas Whitehead, male   DOB: 1955-08-02, 59 y.o.   MRN: [id number]  HPI patient presents with thick nailbeds 1-5 both feet that are painful and lesions that are painful   Review of Systems     Objective:   Physical Exam Neurovascular status was found to be diminished but it is still intact and I noted that the patient does have yellow brittle nailbeds 1-5 both feet that are painful when pressed and lesions underneath the heel of both feet and slightly underneath the fifth metatarsal of both feet    Assessment:     At risk diabetic with mycotic nail infection and lesion formation    Plan:     Debride painful nailbeds 1-5 both feet and lesions both feet and at risk diabetic with no iatrogenic bleeding noted

## 2014-11-23 NOTE — Progress Notes (Signed)
Subjective:     Patient ID: Nicholas Whitehead, male   DOB: December 08, 1955, 59 y.o.   MRN: [id number]  HPI patient presents with severely thickened nailbeds 1-5 both feet that are painful and painful lesions plantar aspect heel of both feet that make it hard for him to walk   Review of Systems     Objective:   Physical Exam Neurovascular status diminished but intact with thick yellow brittle nailbeds 1-5 both feet that are painful and painful keratotic lesions plantar aspect heel of both feet    Assessment:     At risk diabetic with lesion formation and nail disease 1-5 both feet that are painful    Plan:     Debride painful nailbeds 1-5 both feet and lesions on both feet with no iatrogenic bleeding noted

## 2014-11-25 ENCOUNTER — Ambulatory Visit: Payer: Commercial Managed Care - HMO

## 2014-11-25 DIAGNOSIS — F329 Major depressive disorder, single episode, unspecified: Secondary | ICD-10-CM

## 2014-11-25 DIAGNOSIS — F32A Depression, unspecified: Secondary | ICD-10-CM

## 2014-11-25 NOTE — BH Specialist Note (Signed)
Nicholas Whitehead was in a good mood today.  He was frustrated by the [employer] transportation service because they keep having problems - getting the addresses for his appointments wrong, showing up late, etc. He said he was pretty upset last week about the election, but has decided not to look at anything related to it since then and is feeling some better now.  He said he is going to [place] to his brother's house for Thanksgiving and hopes he will be able to return that night.  He was able to joke around some today, indicating his mood is good.  Plan to meet again in 2 weeks. Curley Spice, LCSW

## 2014-11-30 ENCOUNTER — Telehealth: Payer: Self-pay | Admitting: Internal Medicine

## 2014-11-30 ENCOUNTER — Other Ambulatory Visit: Payer: Self-pay

## 2014-11-30 DIAGNOSIS — F419 Anxiety disorder, unspecified: Secondary | ICD-10-CM

## 2014-11-30 DIAGNOSIS — F32A Depression, unspecified: Secondary | ICD-10-CM

## 2014-11-30 DIAGNOSIS — G4700 Insomnia, unspecified: Secondary | ICD-10-CM

## 2014-11-30 DIAGNOSIS — F329 Major depressive disorder, single episode, unspecified: Secondary | ICD-10-CM

## 2014-11-30 MED ORDER — TRAZODONE HCL 100 MG PO TABS: 200.0000 mg | ORAL_TABLET | Freq: Every day | ORAL | Status: DC

## 2014-11-30 NOTE — Telephone Encounter (Signed)
Patient requesting refill for nebivolol (BYSTOLIC) 10 MG tablet [id number] - pt states you told him to start taking 2 a day at his last appt   And traZODone (DESYREL) 100 MG tablet [id number] Pharmacy is [facility] on [address]

## 2014-12-01 ENCOUNTER — Other Ambulatory Visit: Payer: Self-pay | Admitting: Internal Medicine

## 2014-12-01 ENCOUNTER — Telehealth: Payer: Self-pay | Admitting: Endocrinology

## 2014-12-01 DIAGNOSIS — I10 Essential (primary) hypertension: Secondary | ICD-10-CM

## 2014-12-01 MED ORDER — NEBIVOLOL HCL 10 MG PO TABS: 10.0000 mg | ORAL_TABLET | Freq: Every day | ORAL | Status: DC

## 2014-12-01 NOTE — Telephone Encounter (Signed)
Sent to Dr. [name], he's the prescriber of this medication.

## 2014-12-01 NOTE — Telephone Encounter (Signed)
Patient need a refill of his nebivolol (BYSTOLIC) 10 MG tablet he take 2 x a day, send to  [place], [place] [phone] (Phone) [phone] (Fax)

## 2014-12-01 NOTE — Telephone Encounter (Signed)
Nicholas Whitehead, Please see below. This is your patient. Thanks!

## 2014-12-10 ENCOUNTER — Ambulatory Visit: Payer: Commercial Managed Care - HMO

## 2014-12-14 ENCOUNTER — Telehealth: Payer: Self-pay | Admitting: Endocrinology

## 2014-12-14 ENCOUNTER — Other Ambulatory Visit: Payer: Self-pay | Admitting: Internal Medicine

## 2014-12-14 DIAGNOSIS — I10 Essential (primary) hypertension: Secondary | ICD-10-CM

## 2014-12-14 MED ORDER — NEBIVOLOL HCL 10 MG PO TABS: 20.0000 mg | ORAL_TABLET | Freq: Every day | ORAL | Status: DC

## 2014-12-14 NOTE — Telephone Encounter (Signed)
Dr. [name] is the prescriber of this medication, will forward to him.

## 2014-12-14 NOTE — Telephone Encounter (Signed)
Please call in bystolic 2 tabs daily call into [facility] please

## 2014-12-20 ENCOUNTER — Other Ambulatory Visit: Payer: Self-pay | Admitting: Endocrinology

## 2014-12-24 ENCOUNTER — Ambulatory Visit: Payer: Commercial Managed Care - HMO | Admitting: Internal Medicine

## 2014-12-24 ENCOUNTER — Ambulatory Visit: Payer: Commercial Managed Care - HMO

## 2014-12-24 DIAGNOSIS — F329 Major depressive disorder, single episode, unspecified: Secondary | ICD-10-CM

## 2014-12-24 DIAGNOSIS — F32A Depression, unspecified: Secondary | ICD-10-CM

## 2014-12-24 NOTE — BH Specialist Note (Signed)
Nicholas Whitehead was in a pleasant mood today, making jokes and laughing at times.  He talked some about his life as a gay man and how difficult it is to find someone to be in a relationship with.  Overall, his mood has been good lately and he has fewer complaints than a few months ago when he was under stress due to his sister living with him and having to babysit his nephews.  Plan to meet again in 2 weeks. Curley Spice, LCSW

## 2015-01-06 ENCOUNTER — Ambulatory Visit: Payer: Commercial Managed Care - HMO

## 2015-01-06 DIAGNOSIS — F331 Major depressive disorder, recurrent, moderate: Secondary | ICD-10-CM

## 2015-01-06 NOTE — BH Specialist Note (Signed)
Ibhan had a short session due a mix up of the time.  He teased me about this during the first part of the session and then talked about how he wants an "escort" for New Year's Eve.  He appears to be in good spirits today, as he has been in the past few weeks.  Plan to meet again in 2 weeks. Curley Spice, LCSW

## 2015-01-15 ENCOUNTER — Other Ambulatory Visit: Payer: Self-pay | Admitting: Internal Medicine

## 2015-01-19 ENCOUNTER — Ambulatory Visit: Payer: Commercial Managed Care - HMO

## 2015-01-21 ENCOUNTER — Other Ambulatory Visit: Payer: Self-pay | Admitting: Endocrinology

## 2015-01-26 ENCOUNTER — Other Ambulatory Visit: Payer: Self-pay | Admitting: Endocrinology

## 2015-01-29 ENCOUNTER — Other Ambulatory Visit: Payer: Self-pay

## 2015-01-29 DIAGNOSIS — F329 Major depressive disorder, single episode, unspecified: Secondary | ICD-10-CM

## 2015-01-29 DIAGNOSIS — F419 Anxiety disorder, unspecified: Secondary | ICD-10-CM

## 2015-01-29 DIAGNOSIS — F32A Depression, unspecified: Secondary | ICD-10-CM

## 2015-01-29 MED ORDER — SERTRALINE HCL 100 MG PO TABS: 100.0000 mg | ORAL_TABLET | Freq: Every day | ORAL | Status: DC

## 2015-02-02 ENCOUNTER — Ambulatory Visit: Payer: Commercial Managed Care - HMO

## 2015-02-02 DIAGNOSIS — F32A Depression, unspecified: Secondary | ICD-10-CM

## 2015-02-02 DIAGNOSIS — F329 Major depressive disorder, single episode, unspecified: Secondary | ICD-10-CM

## 2015-02-02 NOTE — BH Specialist Note (Signed)
Nicholas Whitehead was in a pleasant mood today, but did report that a couple of weeks ago he fell in the bathtub and was very sore for several days.  He said it took him 2 hours to get out of the tub and his phone was in the other room.  He also reported that the aunt of a former lover of his died recently and he spoke at the funeral.  He said he was upset, but did enjoy speaking and also seeing old friends.  We also talked about "seasonal affective disorder" and speculated on whether he has this.  Plan to meet again in 2 weeks. Nicholas Spice, LCSW

## 2015-02-16 ENCOUNTER — Other Ambulatory Visit (INDEPENDENT_AMBULATORY_CARE_PROVIDER_SITE_OTHER): Payer: Commercial Managed Care - HMO

## 2015-02-16 DIAGNOSIS — Z794 Long term (current) use of insulin: Secondary | ICD-10-CM

## 2015-02-16 DIAGNOSIS — E1165 Type 2 diabetes mellitus with hyperglycemia: Secondary | ICD-10-CM | POA: Diagnosis not present

## 2015-02-16 LAB — COMPREHENSIVE METABOLIC PANEL
ALBUMIN: 3.9 g/dL (ref 3.5–5.2)
ALK PHOS: 58 U/L (ref 39–117)
ALT: 31 U/L (ref 0–53)
AST: 39 U/L — AB (ref 0–37)
BILIRUBIN TOTAL: 0.2 mg/dL (ref 0.2–1.2)
BUN: 11 mg/dL (ref 6–23)
CALCIUM: 9 mg/dL (ref 8.4–10.5)
CO2: 27 mEq/L (ref 19–32)
CREATININE: 0.82 mg/dL (ref 0.40–1.50)
Chloride: 105 mEq/L (ref 96–112)
GFR: 124.41 mL/min (ref >60.00)
Glucose, Bld: 276 mg/dL — ABNORMAL HIGH (ref 70–99)
Potassium: 3.8 mEq/L (ref 3.5–5.1)
Sodium: 138 mEq/L (ref 135–145)
TOTAL PROTEIN: 7.5 g/dL (ref 6.0–8.3)

## 2015-02-16 LAB — HEMOGLOBIN A1C: Hgb A1c MFr Bld: 7.8 % — ABNORMAL HIGH (ref 4.6–6.5)

## 2015-02-17 ENCOUNTER — Ambulatory Visit: Payer: Commercial Managed Care - HMO

## 2015-02-17 DIAGNOSIS — F329 Major depressive disorder, single episode, unspecified: Secondary | ICD-10-CM

## 2015-02-17 DIAGNOSIS — F32A Depression, unspecified: Secondary | ICD-10-CM

## 2015-02-17 NOTE — BH Specialist Note (Signed)
Nicholas Whitehead was sniffling today, reporting that he has had a head cold for the past 2 days.  He said he stumbled in the lobby prior to the session.  He also complained that he has not been able to have an ejaculation in several years due to erectile dysfunction.  I suggested that the fact that he is taking several medications, including blood pressure medications, may be contributing, but that he should talk to his primary care physician about this.  He said he has an appointment with Dr. Dwyane Dee this Friday and he plans to discuss it with him then.  I also talked to him about his depression, which he says is better than before, but is still there.  I provided some psycho-education on EMDR and he expressed interest in giving this a try.  Plan to meet in 2 weeks. Curley Spice, LCSW

## 2015-02-19 ENCOUNTER — Ambulatory Visit: Payer: Commercial Managed Care - HMO | Admitting: Endocrinology

## 2015-02-26 ENCOUNTER — Ambulatory Visit: Payer: Commercial Managed Care - HMO | Admitting: Podiatry

## 2015-03-02 ENCOUNTER — Ambulatory Visit: Payer: Commercial Managed Care - HMO

## 2015-03-02 ENCOUNTER — Ambulatory Visit (INDEPENDENT_AMBULATORY_CARE_PROVIDER_SITE_OTHER): Payer: Commercial Managed Care - HMO | Admitting: *Deleted

## 2015-03-02 DIAGNOSIS — Z23 Encounter for immunization: Secondary | ICD-10-CM | POA: Diagnosis not present

## 2015-03-02 DIAGNOSIS — F329 Major depressive disorder, single episode, unspecified: Secondary | ICD-10-CM

## 2015-03-02 DIAGNOSIS — F32A Depression, unspecified: Secondary | ICD-10-CM

## 2015-03-02 NOTE — BH Specialist Note (Signed)
Nicholas Whitehead came in today reporting that he has been dealing with the flu, but his fever has broken and he is much better. He continues to have congestion.  He got a flu shot prior to our session.  He was in a good mood, reporting that he has been stuck in his apartment for several days due to being sick, but decided he wanted to get out today and come to his appointment here. He was able to joke around some, which is a good sign that his mood continues to be good.  Plan to meet again in 2 weeks. Curley Spice, LCSW

## 2015-03-04 ENCOUNTER — Telehealth: Payer: Self-pay | Admitting: *Deleted

## 2015-03-04 ENCOUNTER — Telehealth: Payer: Self-pay | Admitting: General Practice

## 2015-03-04 DIAGNOSIS — F411 Generalized anxiety disorder: Secondary | ICD-10-CM

## 2015-03-04 MED ORDER — CLONAZEPAM 1 MG PO TABS: 1.0000 mg | ORAL_TABLET | Freq: Three times a day (TID) | ORAL | Status: DC | PRN

## 2015-03-04 NOTE — Telephone Encounter (Signed)
Left message on triage needing refills on his clonazepam.../lmb

## 2015-03-04 NOTE — Telephone Encounter (Signed)
Duplicate msg Per Janace Hoard has been fax to pharmacy...Johny Chess

## 2015-03-04 NOTE — Telephone Encounter (Signed)
faxed

## 2015-03-04 NOTE — Telephone Encounter (Signed)
This was taken care off earlier today. Please look at the chart.

## 2015-03-04 NOTE — Telephone Encounter (Signed)
done

## 2015-03-12 ENCOUNTER — Other Ambulatory Visit: Payer: Self-pay | Admitting: Endocrinology

## 2015-03-15 ENCOUNTER — Other Ambulatory Visit: Payer: Commercial Managed Care - HMO

## 2015-03-16 ENCOUNTER — Other Ambulatory Visit (INDEPENDENT_AMBULATORY_CARE_PROVIDER_SITE_OTHER): Payer: Commercial Managed Care - HMO

## 2015-03-16 ENCOUNTER — Other Ambulatory Visit: Payer: Self-pay | Admitting: *Deleted

## 2015-03-16 ENCOUNTER — Ambulatory Visit: Payer: Commercial Managed Care - HMO

## 2015-03-16 DIAGNOSIS — E785 Hyperlipidemia, unspecified: Secondary | ICD-10-CM

## 2015-03-16 DIAGNOSIS — E1165 Type 2 diabetes mellitus with hyperglycemia: Secondary | ICD-10-CM

## 2015-03-16 DIAGNOSIS — E118 Type 2 diabetes mellitus with unspecified complications: Secondary | ICD-10-CM

## 2015-03-16 DIAGNOSIS — F32A Depression, unspecified: Secondary | ICD-10-CM

## 2015-03-16 DIAGNOSIS — F329 Major depressive disorder, single episode, unspecified: Secondary | ICD-10-CM

## 2015-03-16 LAB — LIPID PANEL
CHOL/HDL RATIO: 3
Cholesterol: 102 mg/dL (ref 0–200)
HDL: 38.2 mg/dL — AB (ref >39.00)
LDL CALC: 46 mg/dL (ref 0–99)
NONHDL: 63.48
Triglycerides: 89 mg/dL (ref 0.0–149.0)
VLDL: 17.8 mg/dL (ref 0.0–40.0)

## 2015-03-16 LAB — BASIC METABOLIC PANEL
BUN: 8 mg/dL (ref 6–23)
CHLORIDE: 106 meq/L (ref 96–112)
CO2: 29 mEq/L (ref 19–32)
CREATININE: 0.84 mg/dL (ref 0.40–1.50)
Calcium: 9 mg/dL (ref 8.4–10.5)
GFR: 120.97 mL/min (ref >60.00)
Glucose, Bld: 140 mg/dL — ABNORMAL HIGH (ref 70–99)
Potassium: 3.7 mEq/L (ref 3.5–5.1)
Sodium: 144 mEq/L (ref 135–145)

## 2015-03-16 NOTE — BH Specialist Note (Signed)
Nicholas Whitehead was in a fairly good mood today, joking around at times as he often does.  But then he did say that he still has bouts of depression from time to time and it's very frustrating.  I shared with him a list of ways to fight depression and encouraged him to take it and read it and let me know if it was helpful in any way.  I reminded him of the support group on every other Friday, but he was non-committal about attending.  I also encouraged him to talk to his primary care physician on Thursday about his problem with erectile dysfunction, which he said he would do. Plan to meet again in 2 weeks. Curley Spice, LCSW

## 2015-03-18 ENCOUNTER — Ambulatory Visit (INDEPENDENT_AMBULATORY_CARE_PROVIDER_SITE_OTHER): Payer: Commercial Managed Care - HMO | Admitting: Endocrinology

## 2015-03-18 ENCOUNTER — Encounter: Payer: Self-pay | Admitting: Endocrinology

## 2015-03-18 VITALS — BP 144/84 | HR 63 | Temp 97.7°F | Resp 14 | Ht 72.0 in | Wt 185.0 lb

## 2015-03-18 DIAGNOSIS — E785 Hyperlipidemia, unspecified: Secondary | ICD-10-CM

## 2015-03-18 DIAGNOSIS — Z794 Long term (current) use of insulin: Secondary | ICD-10-CM | POA: Diagnosis not present

## 2015-03-18 DIAGNOSIS — E1165 Type 2 diabetes mellitus with hyperglycemia: Secondary | ICD-10-CM | POA: Diagnosis not present

## 2015-03-18 MED ORDER — SILDENAFIL CITRATE 20 MG PO TABS: ORAL_TABLET | ORAL | Status: DC

## 2015-03-18 NOTE — Patient Instructions (Signed)
Take Lantus 55 units a.m. And 60 units p.m.,  Take at least 15 novolog with each meal  Check blood sugars on waking up 3  times a week Also check blood sugars about 2 hours after a meal and do this after different meals by rotation  Recommended blood sugar levels on waking up is 90-130 and about 2 hours after meal is 130-160  Please bring your blood sugar monitor to each visit, thank you

## 2015-03-18 NOTE — Progress Notes (Signed)
Patient ID: Nicholas Whitehead, male   DOB: 1955/08/31, 60 y.o.   MRN: [id number]   Reason for Appointment: Type II Diabetes follow-up   History of Present Illness   Diagnosis date: 1998    Previous history: He was started on oral agents at diagnosis and around 2006 was put on insulin His blood sugars are usually well controlled when he takes Actos along with his insulin. He has been on Invokana since 04/2013  Recent history:   Insulin regimen: Lantus 50-55 units bid;  Novolog  20 AC tid         He is on a regimen of basal bolus insulin with Actos. He has usually been requiring large doses of insulin especially  Lantus which he takes twice a day  With this his blood sugars are significantly improved and A1c is below 7 usually  However on this visit his blood sugars are out of control and recently averaging over 200 A1c is relatively higher at 7.8, previously 6.4  Current management, blood sugar patterns and problems:  His blood sugars are higher most of the time and relatively higher in the evenings  He is taking somewhat less Lantus than prescribed previously  Also after some discussion he reveals that he is not taking his Lantus consistently  He will skip his Lantus especially when he is not having much of an appetite  Despite reduced appetite his weight is not much different  He says he is compliant with his Actos  Taking  consistent doses of Novolog of 20 units at each meal even with differences in his meal size and appetite  No hypoglycemia  Oral hypoglycemic drugs: Actos 30 mg    Side effects from medications:  urgency, frequent urination from Invokana Proper timing of medications in relation to meals: Yes.          Monitors blood glucose:  1-2 times a day  Glucometer:  Accu-Chek  Mean values apply above for all meters except median for One Touch  PRE-MEAL Fasting Lunch Dinner Bedtime Overall  Glucose range:  157-229   124-234   217   200-313      Mean/median:      206            Meals: 3 meals per day. Lunch 2-3pm  usually avoiding high-fat meals, dinner 6 pm        Physical activity: exercise: irregular walking, legs weak          Dietician visit: Most recent: 2013   Weight control:  Wt Readings from Last 3 Encounters:  03/18/15 185 lb (83.915 kg)  11/19/14 188 lb 3.2 oz (85.367 kg)  09/22/14 187 lb (84.823 kg)          Diabetes labs:  Lab Results  Component Value Date   HGBA1C 7.8* 02/16/2015   HGBA1C 6.4 11/16/2014   HGBA1C 6.2 08/11/2014   Lab Results  Component Value Date   MICROALBUR 13.0* 11/16/2014   LDLCALC 46 03/16/2015   CREATININE 0.84 03/16/2015   Lab on 03/16/2015  Component Date Value Ref Range Status  . Cholesterol 03/16/2015 102  0 - 200 mg/dL Final   ATP III Classification       Desirable:  < 200 mg/dL               Borderline High:  200 - 239 mg/dL          High:  > = 240 mg/dL  . Triglycerides 03/16/2015 89.0  0.0 - 149.0 mg/dL Final   Normal:  <150 mg/dLBorderline High:  150 - 199 mg/dL  . HDL 03/16/2015 38.20* >39.00 mg/dL Final  . VLDL 03/16/2015 17.8  0.0 - 40.0 mg/dL Final  . LDL Cholesterol 03/16/2015 46  0 - 99 mg/dL Final  . Total CHOL/HDL Ratio 03/16/2015 3   Final                  Men          Women1/2 Average Risk     3.4          3.3Average Risk          5.0          4.42X Average Risk          9.6          7.13X Average Risk          15.0          11.0                      . NonHDL 03/16/2015 63.48   Final   NOTE:  Non-HDL goal should be 30 mg/dL higher than patient's LDL goal (i.e. LDL goal of < 70 mg/dL, would have non-HDL goal of < 100 mg/dL)  . Sodium 03/16/2015 144  135 - 145 mEq/L Final  . Potassium 03/16/2015 3.7  3.5 - 5.1 mEq/L Final  . Chloride 03/16/2015 106  96 - 112 mEq/L Final  . CO2 03/16/2015 29  19 - 32 mEq/L Final  . Glucose, Bld 03/16/2015 140* 70 - 99 mg/dL Final  . BUN 03/16/2015 8  6 - 23 mg/dL Final  . Creatinine, Ser 03/16/2015 0.84  0.40 - 1.50 mg/dL  Final  . Calcium 03/16/2015 9.0  8.4 - 10.5 mg/dL Final  . GFR 03/16/2015 120.97  >60.00 mL/min Final       Medication List       This list is accurate as of: 03/18/15 11:59 PM.  Always use your most recent med list.               ACCU-CHEK FASTCLIX LANCETS Misc  USE  TO CHECK BLOOD SUGAR THREE TIMES DAILY     ACCU-CHEK SMARTVIEW test strip  Generic drug:  glucose blood  USE AS DIRECTED  TO CHECK BLOOD SUGAR THREE TIMES DAILY     aspirin EC 81 MG tablet  Take 81 mg by mouth every morning.     atenolol 50 MG tablet  Commonly known as:  TENORMIN     benazepril 20 MG tablet  Commonly known as:  LOTENSIN  Take 1 tablet (20 mg total) by mouth daily.     cetirizine 10 MG tablet  Commonly known as:  ZYRTEC  Take 10 mg by mouth daily.     clonazePAM 1 MG tablet  Commonly known as:  KLONOPIN  Take 1 tablet (1 mg total) by mouth 3 (three) times daily as needed for anxiety.     COD LIVER OIL PO  Take 1 capsule by mouth every morning.     COMPLERA 200-25-300 MG tablet  Generic drug:  emtricitabine-rilpivir-tenofovir DF     docusate sodium 100 MG capsule  Commonly known as:  COLACE  Take 100 mg by mouth every morning.     gabapentin 300 MG capsule  Commonly known as:  NEURONTIN  take 1 capsule by mouth three times a day     Insulin Pen  Needle 31G X 8 MM Misc  Commonly known as:  B-D ULTRAFINE III SHORT PEN  Use to administer insulin Dx E11.9     LANTUS SOLOSTAR 100 UNIT/ML Solostar Pen  Generic drug:  Insulin Glargine  inject 55 units subcutaneously every morning and 60 units every evening     Melatonin 3 MG Tabs  Take by mouth at bedtime.     multivitamin tablet  Take 1 tablet by mouth daily.     nebivolol 10 MG tablet  Commonly known as:  BYSTOLIC  Take 2 tablets (20 mg total) by mouth daily.     NOVOLOG FLEXPEN 100 UNIT/ML FlexPen  Generic drug:  insulin aspart  INJECT 20 TO 25 UNITS SUBCUTANEOUSLY THREE TIMES A DAY WITH MEALS     pioglitazone 30 MG  tablet  Commonly known as:  ACTOS  take 1 tablet by mouth once daily     pravastatin 40 MG tablet  Commonly known as:  PRAVACHOL  take 1 tablet by mouth every evening     sertraline 100 MG tablet  Commonly known as:  ZOLOFT  Take 1 tablet (100 mg total) by mouth daily before breakfast.     sildenafil 20 MG tablet  Commonly known as:  REVATIO  Take 3 as needed     traZODone 100 MG tablet  Commonly known as:  DESYREL  take 2 tablets by mouth at bedtime     triamcinolone 0.1 % cream : eucerin Crea  Apply 1 application topically 3 (three) times daily.        Allergies:  Allergies  Allergen Reactions  . Glipizide Nausea Only    Past Medical History  Diagnosis Date  . HIV infection ([place])   . Hypertension   . Diabetes mellitus without complication ([place])   . Depression   . Anxiety   . Hypercholesterolemia   . Cataract     right   . DM (diabetes mellitus screen)   . HIV disease ([place]) 09/12/1995  . Hyperlipidemia     Past Surgical History  Procedure Laterality Date  . Eye surgery  2005    catarct surgery  . Optic lens surgery  2005  . Colonoscopy with propofol  01/16/2012    Procedure: COLONOSCOPY WITH PROPOFOL;  Surgeon: Garlan Fair, MD;  Location: [facility];  Service: Endoscopy;  Laterality: N/A;    Family History  Problem Relation Age of Onset  . Arthritis Mother   . Diabetes Mother   . Heart disease Mother   . Arthritis Father   . Diabetes Father   . Heart disease Father     Social History:  reports that he has been smoking Cigarettes.  He has been smoking about 0.25 packs per day. He has never used smokeless tobacco. He reports that he does not drink alcohol or use illicit drugs.  Review of Systems:   Hypertension:  currently on atenolol and on benazepril, not clear why Bystolic has also been prescribed Followed by PCP  Lipids: LDL is controlled with taking pravastatin HDL still low  Lab Results  Component Value Date   CHOL 102 03/16/2015     HDL 38.20* 03/16/2015   LDLCALC 46 03/16/2015   TRIG 89.0 03/16/2015   CHOLHDL 3 03/16/2015    He has symptoms of neuropathy with paresthesia in feet and legs  Recently symptoms are well controlled with 300 mg of gabapentin, 3 daily.   Depression: Not well-controlled, followed by psychiatrist    LABS:  Lab on 03/16/2015  Component Date Value Ref Range Status  . Cholesterol 03/16/2015 102  0 - 200 mg/dL Final   ATP III Classification       Desirable:  < 200 mg/dL               Borderline High:  200 - 239 mg/dL          High:  > = 240 mg/dL  . Triglycerides 03/16/2015 89.0  0.0 - 149.0 mg/dL Final   Normal:  <150 mg/dLBorderline High:  150 - 199 mg/dL  . HDL 03/16/2015 38.20* >39.00 mg/dL Final  . VLDL 03/16/2015 17.8  0.0 - 40.0 mg/dL Final  . LDL Cholesterol 03/16/2015 46  0 - 99 mg/dL Final  . Total CHOL/HDL Ratio 03/16/2015 3   Final                  Men          Women1/2 Average Risk     3.4          3.3Average Risk          5.0          4.42X Average Risk          9.6          7.13X Average Risk          15.0          11.0                      . NonHDL 03/16/2015 63.48   Final   NOTE:  Non-HDL goal should be 30 mg/dL higher than patient's LDL goal (i.e. LDL goal of < 70 mg/dL, would have non-HDL goal of < 100 mg/dL)  . Sodium 03/16/2015 144  135 - 145 mEq/L Final  . Potassium 03/16/2015 3.7  3.5 - 5.1 mEq/L Final  . Chloride 03/16/2015 106  96 - 112 mEq/L Final  . CO2 03/16/2015 29  19 - 32 mEq/L Final  . Glucose, Bld 03/16/2015 140* 70 - 99 mg/dL Final  . BUN 03/16/2015 8  6 - 23 mg/dL Final  . Creatinine, Ser 03/16/2015 0.84  0.40 - 1.50 mg/dL Final  . Calcium 03/16/2015 9.0  8.4 - 10.5 mg/dL Final  . GFR 03/16/2015 120.97  >60.00 mL/min Final     Examination:   BP 144/84 mmHg  Pulse 63  Temp(Src) 97.7 F (36.5 C)  Resp 14  Ht 6' (1.829 m)  Wt 185 lb (83.915 kg)  BMI 25.08 kg/m2  SpO2 94%  Body mass index is 25.08 kg/(m^2).     ASSESSMENT/ PLAN:     Diabetes type 2:  See history of present illness for detailed discussion of his current management, blood sugar patterns and problems identified His A1c is higher than usual His blood sugars are seen in fairly high throughout the day especially in the evenings This is likely to be from noncompliance with his insulin even though he is eating less because of decreased appetite He does not understand the need to take insistent amounts of basal insulin even if he is not eating since he is insulin resistant. He probably needs somewhat less Novolog based on his meal size but not skip it completely also Discussed needing to go back to same dose of LANTUS that he was taking before since even fasting blood sugars are high  Hypertension: Followed by PCP, advised him to follow-up with PCP  Decreased appetite and general malaise: He  will follow-up with PCP  Patient Instructions  Take Lantus 55 units a.m. And 60 units p.m.,  Take at least 15 novolog with each meal  Check blood sugars on waking up 3  times a week Also check blood sugars about 2 hours after a meal and do this after different meals by rotation  Recommended blood sugar levels on waking up is 90-130 and about 2 hours after meal is 130-160  Please bring your blood sugar monitor to each visit, thank you      Counseling time on subjects discussed above is over 50% of today's 25 minute visit    Sender Rueb 03/19/2015, 10:46 AM

## 2015-03-25 ENCOUNTER — Ambulatory Visit (INDEPENDENT_AMBULATORY_CARE_PROVIDER_SITE_OTHER): Payer: Commercial Managed Care - HMO | Admitting: Internal Medicine

## 2015-03-25 ENCOUNTER — Encounter: Payer: Self-pay | Admitting: Internal Medicine

## 2015-03-25 ENCOUNTER — Ambulatory Visit (INDEPENDENT_AMBULATORY_CARE_PROVIDER_SITE_OTHER)
Admission: RE | Admit: 2015-03-25 | Discharge: 2015-03-25 | Disposition: A | Discharge status: Home / Self Care | Payer: Commercial Managed Care - HMO | Source: Ambulatory Visit | Attending: Internal Medicine | Admitting: Internal Medicine

## 2015-03-25 ENCOUNTER — Other Ambulatory Visit (INDEPENDENT_AMBULATORY_CARE_PROVIDER_SITE_OTHER): Payer: Commercial Managed Care - HMO

## 2015-03-25 VITALS — BP 148/88 | HR 60 | Temp 97.7°F | Resp 16 | Ht 72.0 in | Wt 179.0 lb

## 2015-03-25 DIAGNOSIS — N400 Enlarged prostate without lower urinary tract symptoms: Secondary | ICD-10-CM

## 2015-03-25 DIAGNOSIS — R9431 Abnormal electrocardiogram [ECG] [EKG]: Secondary | ICD-10-CM

## 2015-03-25 DIAGNOSIS — R05 Cough: Secondary | ICD-10-CM | POA: Diagnosis not present

## 2015-03-25 DIAGNOSIS — R0609 Other forms of dyspnea: Secondary | ICD-10-CM | POA: Diagnosis not present

## 2015-03-25 DIAGNOSIS — E1140 Type 2 diabetes mellitus with diabetic neuropathy, unspecified: Secondary | ICD-10-CM

## 2015-03-25 DIAGNOSIS — I10 Essential (primary) hypertension: Secondary | ICD-10-CM

## 2015-03-25 DIAGNOSIS — R059 Cough, unspecified: Secondary | ICD-10-CM

## 2015-03-25 DIAGNOSIS — F411 Generalized anxiety disorder: Secondary | ICD-10-CM

## 2015-03-25 DIAGNOSIS — L309 Dermatitis, unspecified: Secondary | ICD-10-CM

## 2015-03-25 LAB — URINALYSIS, ROUTINE W REFLEX MICROSCOPIC
BILIRUBIN URINE: NEGATIVE
KETONES UR: NEGATIVE
LEUKOCYTES UA: NEGATIVE
NITRITE: NEGATIVE
SPECIFIC GRAVITY, URINE: 1.025 (ref 1.000–1.030)
Total Protein, Urine: 30 — AB
URINE GLUCOSE: NEGATIVE
UROBILINOGEN UA: 0.2 (ref 0.0–1.0)
WBC UA: NONE SEEN (ref 0–?)
pH: 6 (ref 5.0–8.0)

## 2015-03-25 LAB — TROPONIN I: TNIDX: 0.01 ug/L (ref 0.00–0.06)

## 2015-03-25 LAB — PSA: PSA: 0.82 ng/mL (ref 0.10–4.00)

## 2015-03-25 LAB — CARDIAC PANEL
CK MB: 2.3 ng/mL (ref 0.3–4.0)
Relative Index: 1.8 calc (ref 0.0–2.5)
Total CK: 130 U/L (ref 7–232)

## 2015-03-25 LAB — BRAIN NATRIURETIC PEPTIDE: Pro B Natriuretic peptide (BNP): 53 pg/mL (ref 0.0–100.0)

## 2015-03-25 LAB — TSH: TSH: 0.93 u[IU]/mL (ref 0.35–4.50)

## 2015-03-25 MED ORDER — CLONAZEPAM 1 MG PO TABS: 1.0000 mg | ORAL_TABLET | Freq: Three times a day (TID) | ORAL | Status: DC | PRN

## 2015-03-25 MED ORDER — IRBESARTAN 300 MG PO TABS: 300.0000 mg | ORAL_TABLET | Freq: Every day | ORAL | Status: DC

## 2015-03-25 MED ORDER — TRIAMCINOLONE 0.1 % CREAM:EUCERIN CREAM 1:1: 1.0000 "application " | TOPICAL_CREAM | Freq: Three times a day (TID) | CUTANEOUS | Status: DC

## 2015-03-25 NOTE — Progress Notes (Signed)
Subjective:  Patient ID: Nicholas Whitehead, male    DOB: 12-24-1955  Age: 60 y.o. MRN: [id number]  CC: Cough and Hypertension   HPI Nicholas Whitehead presents for Follow-up.  He complains of a recurrent itchy rash on both hands. He has had this intermittently for many years. He previously used Eucerin and a topical steroid and got symptom relief. Over the last few months he has not been using any of those topical remedies. This seems to get worse for him in the winter.  He is also due for a blood pressure check and he complains of persistent, mild, nonproductive cough. He also has dyspnea on exertion. He denies chest pain, dizziness, lightheadedness, diaphoresis, edema, or near-syncope. The symptoms have developed gradually over the last year.  Outpatient Prescriptions Prior to Visit  Medication Sig Dispense Refill  . ACCU-CHEK FASTCLIX LANCETS MISC USE  TO CHECK BLOOD SUGAR THREE TIMES DAILY 306 each 1  . ACCU-CHEK SMARTVIEW test strip USE AS DIRECTED  TO CHECK BLOOD SUGAR THREE TIMES DAILY 300 each 1  . aspirin EC 81 MG tablet Take 81 mg by mouth every morning.    . cetirizine (ZYRTEC) 10 MG tablet Take 10 mg by mouth daily.    . COD LIVER OIL PO Take 1 capsule by mouth every morning.    . COMPLERA 200-25-300 MG TABS   1  . docusate sodium (COLACE) 100 MG capsule Take 100 mg by mouth every morning.    . gabapentin (NEURONTIN) 300 MG capsule take 1 capsule by mouth three times a day 90 capsule 3  . Insulin Pen Needle (B-D ULTRAFINE III SHORT PEN) 31G X 8 MM MISC Use to administer insulin Dx E11.9 300 each 3  . LANTUS SOLOSTAR 100 UNIT/ML Solostar Pen inject 55 units subcutaneously every morning and 60 units every evening 30 mL 3  . Melatonin 3 MG TABS Take by mouth at bedtime.    . Multiple Vitamin (MULTIVITAMIN) tablet Take 1 tablet by mouth daily.      . nebivolol (BYSTOLIC) 10 MG tablet Take 2 tablets (20 mg total) by mouth daily. 60 tablet 11  . NOVOLOG FLEXPEN 100 UNIT/ML FlexPen INJECT  20 TO 25 UNITS SUBCUTANEOUSLY THREE TIMES A DAY WITH MEALS 30 mL 2  . pioglitazone (ACTOS) 30 MG tablet take 1 tablet by mouth once daily 30 tablet 3  . pravastatin (PRAVACHOL) 40 MG tablet take 1 tablet by mouth every evening 30 tablet 4  . sertraline (ZOLOFT) 100 MG tablet Take 1 tablet (100 mg total) by mouth daily before breakfast. 30 tablet 11  . sildenafil (REVATIO) 20 MG tablet Take 3 as needed 20 tablet 1  . traZODone (DESYREL) 100 MG tablet take 2 tablets by mouth at bedtime 60 tablet 11  . benazepril (LOTENSIN) 20 MG tablet Take 1 tablet (20 mg total) by mouth daily. 90 tablet 3  . clonazePAM (KLONOPIN) 1 MG tablet Take 1 tablet (1 mg total) by mouth 3 (three) times daily as needed for anxiety. 75 tablet 3  . Triamcinolone Acetonide (TRIAMCINOLONE 0.1 % CREAM : EUCERIN) CREA Apply 1 application topically 3 (three) times daily. 1 each 3  . atenolol (TENORMIN) 50 MG tablet Reported on 03/25/2015  1   No facility-administered medications prior to visit.    ROS Review of Systems  Constitutional: Negative.  Negative for fever, chills, diaphoresis, appetite change and fatigue.  HENT: Negative.  Negative for congestion, nosebleeds, postnasal drip, rhinorrhea, sinus pressure, sore throat and trouble swallowing.  Eyes: Negative.   Respiratory: Positive for cough. Negative for apnea, choking, chest tightness, shortness of breath, wheezing and stridor.   Cardiovascular: Negative.  Negative for chest pain, palpitations and leg swelling.  Gastrointestinal: Negative.  Negative for nausea, vomiting, abdominal pain, diarrhea, constipation and blood in stool.  Endocrine: Negative.  Negative for polydipsia, polyphagia and polyuria.  Genitourinary: Negative.   Musculoskeletal: Negative.   Skin: Positive for rash. Negative for color change and pallor.  Allergic/Immunologic: Negative.   Neurological: Negative.  Negative for dizziness, syncope, weakness, light-headedness and numbness.  Hematological:  Negative for adenopathy. Does not bruise/bleed easily.  Psychiatric/Behavioral: Positive for sleep disturbance. Negative for suicidal ideas, hallucinations, behavioral problems, confusion, self-injury, dysphoric mood, decreased concentration and agitation. The patient is nervous/anxious. The patient is not hyperactive.     Objective:  BP 148/88 mmHg  Pulse 60  Temp(Src) 97.7 F (36.5 C) (Oral)  Resp 16  Ht 6' (1.829 m)  Wt 179 lb (81.194 kg)  BMI 24.27 kg/m2  SpO2 96%  BP Readings from Last 3 Encounters:  03/25/15 148/88  03/18/15 144/84  11/19/14 138/90    Wt Readings from Last 3 Encounters:  03/25/15 179 lb (81.194 kg)  03/18/15 185 lb (83.915 kg)  11/19/14 188 lb 3.2 oz (85.367 kg)    Physical Exam  Constitutional: He is oriented to person, place, and time.  Non-toxic appearance. He does not have a sickly appearance. He does not appear ill. No distress.  Eyes: Conjunctivae are normal. Right eye exhibits no discharge. Left eye exhibits no discharge. No scleral icterus.  Neck: Normal range of motion. Neck supple. No JVD present. No tracheal deviation present. No thyromegaly present.  Cardiovascular: Normal rate, regular rhythm, normal heart sounds and intact distal pulses.  Exam reveals no gallop and no friction rub.   No murmur heard. EKG --  Sinus  Bradycardia  Voltage criteria for LVH  (S(V1)+R(V6) exceeds 3.50 mV)  -Voltage criteria w/o ST/T abnormality may be normal.   -consider old anterior infarct.   ABNORMAL   Pulmonary/Chest: Effort normal and breath sounds normal. No stridor. No respiratory distress. He has no wheezes. He has no rales. He exhibits no tenderness.  Abdominal: Soft. Bowel sounds are normal. He exhibits no distension and no mass. There is no tenderness. There is no rebound and no guarding.  Musculoskeletal: Normal range of motion. He exhibits no edema or tenderness.  Lymphadenopathy:    He has no cervical adenopathy.  Neurological: He is oriented  to person, place, and time.  Skin: Skin is warm and dry. Rash noted. Rash is not pustular and not urticarial. He is not diaphoretic. No cyanosis or erythema. No pallor. Nails show no clubbing.  Over the dorsum of both hands there is very mild fissuring, there is severe xerosis and lichenification. There are a few tiny subcutaneous nodules that measure about 3-4 mm each. There is no exudate, fluctuance, induration, erythema, or streaking.  Psychiatric: He has a normal mood and affect. His behavior is normal. Judgment and thought content normal.  Vitals reviewed.   Lab Results  Component Value Date   WBC 6.7 09/22/2014   HGB 16.2 09/22/2014   HCT 46.2 09/22/2014   PLT 127* 09/22/2014   GLUCOSE 140* 03/16/2015   CHOL 102 03/16/2015   TRIG 89.0 03/16/2015   HDL 38.20* 03/16/2015   LDLCALC 46 03/16/2015   ALT 31 02/16/2015   AST 39* 02/16/2015   NA 144 03/16/2015   K 3.7 03/16/2015   CL 106  03/16/2015   CREATININE 0.84 03/16/2015   BUN 8 03/16/2015   CO2 29 03/16/2015   TSH 0.93 03/25/2015   PSA 0.82 03/25/2015   HGBA1C 7.8* 02/16/2015   MICROALBUR 13.0* 11/16/2014    No results found.  Assessment & Plan:   Marcia was seen today for cough and hypertension.  Diagnoses and all orders for this visit:  DOE (dyspnea on exertion)- His EKG is abnormal but his cardiac enzymes and BNP are normal, he is high risk for coronary artery disease so I have ordered a Lexiscan to screen for ischemia. -     EKG 12-Lead -     Myocardial Perfusion Imaging; Future -     Cardiac panel; Future -     Troponin I; Future -     Brain natriuretic peptide; Future  Essential hypertension- his blood pressure is well-controlled, his electrolytes and renal function are stable, he has however developed a recurrent cough which I think is caused by the ACE inhibitor, will discontinue the ACEI and switch to an ARB. -     TSH; Future -     Urinalysis, Routine w reflex microscopic (not at [facility]); Future -      irbesartan (AVAPRO) 300 MG tablet; Take 1 tablet (300 mg total) by mouth daily.  Type 2 diabetes mellitus with diabetic neuropathy, without long-term current use of insulin ([place])- his recent A1c was 7.8%. This is managed by endocrinology. -     irbesartan (AVAPRO) 300 MG tablet; Take 1 tablet (300 mg total) by mouth daily.  Cough- his exam and chest x-ray are normal, this appears to be related to the ACE inhibitor. Will discontinue the ACE inhibitor and observe. -     DG Chest 2 View; Future  Nonspecific abnormal electrocardiogram (ECG) (EKG)- he has symptoms and an abnormal EKG that shows LVH -     Myocardial Perfusion Imaging; Future -     Cardiac panel; Future -     Troponin I; Future -     Brain natriuretic peptide; Future  BPH (benign prostatic hyperplasia) -     Urinalysis, Routine w reflex microscopic (not at [facility]); Future -     PSA; Future  Acute eczema of hand- he will restart the use of Eucerin cream and will add a topical steroid as well. -     Triamcinolone Acetonide (TRIAMCINOLONE 0.1 % CREAM : EUCERIN) CREA; Apply 1 application topically 3 (three) times daily.  Dermatitis and concerns for an old anterior myocardial infarction. He is high risk for coronary artery disease so I have ordered a [place].  GAD (generalized anxiety disorder) -     clonazePAM (KLONOPIN) 1 MG tablet; Take 1 tablet (1 mg total) by mouth 3 (three) times daily as needed for anxiety.   I have discontinued Mr. Goad benazepril and atenolol. I am also having him start on irbesartan. Additionally, I am having him maintain his multivitamin, cetirizine, aspirin EC, COD LIVER OIL PO, docusate sodium, Melatonin, Insulin Pen Needle, ACCU-CHEK FASTCLIX LANCETS, ACCU-CHEK SMARTVIEW, COMPLERA, pioglitazone, nebivolol, LANTUS SOLOSTAR, traZODone, NOVOLOG FLEXPEN, pravastatin, sertraline, gabapentin, sildenafil, triamcinolone 0.1 % cream : eucerin, and clonazePAM.  Meds ordered this encounter  Medications  .  irbesartan (AVAPRO) 300 MG tablet    Sig: Take 1 tablet (300 mg total) by mouth daily.    Dispense:  90 tablet    Refill:  3  . Triamcinolone Acetonide (TRIAMCINOLONE 0.1 % CREAM : EUCERIN) CREA    Sig: Apply 1 application topically 3 (three)  times daily.    Dispense:  1 each    Refill:  3  . clonazePAM (KLONOPIN) 1 MG tablet    Sig: Take 1 tablet (1 mg total) by mouth 3 (three) times daily as needed for anxiety.    Dispense:  90 tablet    Refill:  3     Follow-up: Return in about 6 weeks (around 05/06/2015).  Scarlette Calico, MD

## 2015-03-25 NOTE — Progress Notes (Signed)
Pre visit review using our clinic review tool, if applicable. No additional management support is needed unless otherwise documented below in the visit note. 

## 2015-03-25 NOTE — Patient Instructions (Signed)

## 2015-03-29 ENCOUNTER — Other Ambulatory Visit: Payer: Self-pay | Admitting: Internal Medicine

## 2015-03-29 ENCOUNTER — Ambulatory Visit: Payer: Commercial Managed Care - HMO

## 2015-03-29 MED ORDER — COMPLERA 200-25-300 MG PO TABS: 1.0000 | ORAL_TABLET | Freq: Every day | ORAL | Status: DC

## 2015-03-29 NOTE — Telephone Encounter (Signed)
Pt requesting refill for [facility] [phone] [place] TABS [id number] Pharmacy is [facility] on [address]

## 2015-03-29 NOTE — Telephone Encounter (Signed)
Ok to fill 

## 2015-03-30 ENCOUNTER — Ambulatory Visit: Payer: Commercial Managed Care - HMO

## 2015-03-30 DIAGNOSIS — F329 Major depressive disorder, single episode, unspecified: Secondary | ICD-10-CM

## 2015-03-30 DIAGNOSIS — F32A Depression, unspecified: Secondary | ICD-10-CM

## 2015-03-30 NOTE — BH Specialist Note (Signed)
Nicholas Whitehead reported that his doctor had an EKG done and now thinks that he may have had a heart attack.  This, of course, was very upsetting to him. He said they are going to schedule him very soon for a test, using dye, to further check his heart. He also reported that he got a prescription for generic Cialis and that it works.  He said this has been a problem for several years and he is glad to finally resolve it. Overall, he says his mood has been good, though he is worried about his heart.  Plan to meet again in 2 weeks. Curley Spice, LCSW

## 2015-03-31 ENCOUNTER — Other Ambulatory Visit: Payer: Self-pay | Admitting: Endocrinology

## 2015-04-03 ENCOUNTER — Other Ambulatory Visit: Payer: Self-pay | Admitting: Endocrinology

## 2015-04-06 ENCOUNTER — Ambulatory Visit: Payer: Commercial Managed Care - HMO | Admitting: Podiatry

## 2015-04-07 ENCOUNTER — Telehealth (HOSPITAL_COMMUNITY): Payer: Self-pay | Admitting: *Deleted

## 2015-04-07 NOTE — Telephone Encounter (Signed)
Patient given detailed instructions per Myocardial Perfusion Study Information Sheet for the test on 04/12/15. Patient notified to arrive 15 minutes early and that it is imperative to arrive on time for appointment to keep from having the test rescheduled.  If you need to cancel or reschedule your appointment, please call the office within 24 hours of your appointment. Failure to do so may result in a cancellation of your appointment, and a $50 no show fee. Patient verbalized understanding.Iolani Twilley J Landrey Mahurin, RN  

## 2015-04-12 ENCOUNTER — Ambulatory Visit (HOSPITAL_COMMUNITY): Payer: Commercial Managed Care - HMO | Attending: Cardiovascular Disease

## 2015-04-12 DIAGNOSIS — R9439 Abnormal result of other cardiovascular function study: Secondary | ICD-10-CM | POA: Insufficient documentation

## 2015-04-12 DIAGNOSIS — R0609 Other forms of dyspnea: Secondary | ICD-10-CM

## 2015-04-12 DIAGNOSIS — I10 Essential (primary) hypertension: Secondary | ICD-10-CM | POA: Insufficient documentation

## 2015-04-12 DIAGNOSIS — R0789 Other chest pain: Secondary | ICD-10-CM | POA: Insufficient documentation

## 2015-04-12 DIAGNOSIS — R9431 Abnormal electrocardiogram [ECG] [EKG]: Secondary | ICD-10-CM | POA: Diagnosis not present

## 2015-04-12 DIAGNOSIS — E119 Type 2 diabetes mellitus without complications: Secondary | ICD-10-CM | POA: Diagnosis not present

## 2015-04-12 LAB — MYOCARDIAL PERFUSION IMAGING
CHL CUP NUCLEAR SRS: 4
CHL CUP NUCLEAR SSS: 4
CHL CUP RESTING HR STRESS: 45 {beats}/min
CSEPPHR: 65 {beats}/min
LVDIAVOL: 159 mL (ref 62–150)
LVSYSVOL: 77 mL
NUC STRESS TID: 1.05
RATE: 0.33
SDS: 0

## 2015-04-12 MED ORDER — TECHNETIUM TC 99M SESTAMIBI GENERIC - CARDIOLITE
10.8000 | Freq: Once | INTRAVENOUS | Status: AC | PRN
  Administered 2015-04-12: 11 via INTRAVENOUS

## 2015-04-12 MED ORDER — TECHNETIUM TC 99M SESTAMIBI GENERIC - CARDIOLITE
32.4000 | Freq: Once | INTRAVENOUS | Status: AC | PRN
  Administered 2015-04-12: 32 via INTRAVENOUS

## 2015-04-12 MED ORDER — REGADENOSON 0.4 MG/5ML IV SOLN
0.4000 mg | Freq: Once | INTRAVENOUS | Status: AC
  Administered 2015-04-12: 0.4 mg via INTRAVENOUS

## 2015-04-13 ENCOUNTER — Ambulatory Visit: Payer: Commercial Managed Care - HMO

## 2015-04-15 ENCOUNTER — Ambulatory Visit: Payer: Commercial Managed Care - HMO

## 2015-04-15 DIAGNOSIS — F32A Depression, unspecified: Secondary | ICD-10-CM

## 2015-04-15 DIAGNOSIS — F329 Major depressive disorder, single episode, unspecified: Secondary | ICD-10-CM

## 2015-04-15 NOTE — BH Specialist Note (Signed)
Nicholas Whitehead seemed more tired than usual today and said he has been dealing with faulty plumbing at his apartment since Sunday night. He said the landlord did call "[employer]", who came yesterday, but now he needs the carpets cleaned and the landlord doesn't want to pay for that.  I told him I thought that should be the landlord's responsibility, but I helped him look up a carpet cleaning business online. He also said that the [employer] is threatening to kick him out of his apartment, but it wasn't clear why this may be happening.  He said his sister is helping him write a letter to them.  Plan to meet again in 2 weeks. Curley Spice, LCSW

## 2015-04-16 ENCOUNTER — Ambulatory Visit: Payer: Commercial Managed Care - HMO | Admitting: Podiatry

## 2015-04-20 ENCOUNTER — Telehealth: Payer: Self-pay | Admitting: *Deleted

## 2015-04-20 DIAGNOSIS — L309 Dermatitis, unspecified: Secondary | ICD-10-CM

## 2015-04-20 MED ORDER — TRIAMCINOLONE ACETONIDE 0.5 % EX CREA: 1.0000 "application " | TOPICAL_CREAM | Freq: Three times a day (TID) | CUTANEOUS | Status: DC

## 2015-04-20 NOTE — Telephone Encounter (Signed)
Left msg on triage stating MD sent rx for Triamcinlone-Euceribn cream 0.1% which is not covered under pt insurance plan, he can get the plain Triamcinolone 0.1% at no charge, and can advice pt to mix Eucerin otc w/cream. Wanting to know will this be ok...Nicholas Whitehead

## 2015-04-20 NOTE — Telephone Encounter (Signed)
Done

## 2015-04-26 ENCOUNTER — Other Ambulatory Visit (INDEPENDENT_AMBULATORY_CARE_PROVIDER_SITE_OTHER): Payer: Commercial Managed Care - HMO

## 2015-04-26 DIAGNOSIS — IMO0002 Reserved for concepts with insufficient information to code with codable children: Secondary | ICD-10-CM

## 2015-04-26 DIAGNOSIS — E1165 Type 2 diabetes mellitus with hyperglycemia: Secondary | ICD-10-CM | POA: Diagnosis not present

## 2015-04-26 DIAGNOSIS — Z794 Long term (current) use of insulin: Secondary | ICD-10-CM

## 2015-04-26 LAB — BASIC METABOLIC PANEL
BUN: 9 mg/dL (ref 6–23)
CALCIUM: 9.2 mg/dL (ref 8.4–10.5)
CO2: 28 mEq/L (ref 19–32)
CREATININE: 0.86 mg/dL (ref 0.40–1.50)
Chloride: 105 mEq/L (ref 96–112)
GFR: 117.68 mL/min (ref >60.00)
Glucose, Bld: 97 mg/dL (ref 70–99)
Potassium: 4.2 mEq/L (ref 3.5–5.1)
Sodium: 140 mEq/L (ref 135–145)

## 2015-04-27 ENCOUNTER — Ambulatory Visit: Payer: Commercial Managed Care - HMO

## 2015-04-27 ENCOUNTER — Ambulatory Visit: Payer: Commercial Managed Care - HMO | Admitting: Podiatry

## 2015-04-27 LAB — FRUCTOSAMINE: Fructosamine: 249 umol/L (ref 0–285)

## 2015-04-29 ENCOUNTER — Ambulatory Visit (INDEPENDENT_AMBULATORY_CARE_PROVIDER_SITE_OTHER): Payer: Commercial Managed Care - HMO | Admitting: Endocrinology

## 2015-04-29 ENCOUNTER — Encounter: Payer: Self-pay | Admitting: Endocrinology

## 2015-04-29 VITALS — BP 140/80 | HR 50 | Temp 97.7°F | Resp 14 | Ht 72.0 in | Wt 187.0 lb

## 2015-04-29 DIAGNOSIS — E1140 Type 2 diabetes mellitus with diabetic neuropathy, unspecified: Secondary | ICD-10-CM | POA: Diagnosis not present

## 2015-04-29 DIAGNOSIS — Z794 Long term (current) use of insulin: Secondary | ICD-10-CM | POA: Diagnosis not present

## 2015-04-29 NOTE — Patient Instructions (Signed)
REDUCE PM DOSE OF LANTUS TO 55 ALSO  Check blood sugars on waking up 4-5  times a week Also check blood sugars about 2 hours after a meal and do this after different meals by rotation  Recommended blood sugar levels on waking up is 90-130 and about 2 hours after meal is 130-160  Please bring your blood sugar monitor to each visit, thank you

## 2015-04-29 NOTE — Progress Notes (Signed)
Patient ID: Nicholas Whitehead, male   DOB: April 11, 1955, 60 y.o.   MRN: [id number]   Reason for Appointment: Type II Diabetes follow-up   History of Present Illness   Diagnosis date: 1998    Previous history: He was started on oral agents at diagnosis and around 2006 was put on insulin His blood sugars are usually well controlled when he takes Actos along with his insulin. He has been on Invokana since 04/2013  Recent history:   Insulin regimen: Lantus 55-60 units bid;  Novolog  20 AC tid         He is on a regimen of basal bolus insulin with Actos. He has usually been requiring large doses of insulin especially  Lantus which he takes twice a day  His A1c is below 7 usually but in 02/2015 because of poor compliance with insulin and all measures of self-care his A1c had gone up to 7.8 and glucose averaging over 200   Current management, blood sugar patterns and problems:  His blood sugars are excellent now with his getting back on his insulin regimen as prescribed and is monitoring his sugars as well as watching his diet better  He is taking 5 units more Lantus in the evening than in the morning which was an older dose and he tends to have low normal sugars in the mornings including a few readings in the 60s  Usually No hypoglycemia later in the day, lowest reading after supper is 86  He has gained weight since his last visit probably because of improved blood sugars  Taking  the same doses of Novolog of 20 units at each meal even with differences in his meal size and appetite  Oral hypoglycemic drugs: Actos 30 mg    Side effects from medications:  urgency, frequent urination from Invokana Proper timing of medications in relation to meals: Yes.          Monitors blood glucose:  1-2 times a day  Glucometer:  Accu-Chek  Mean values apply above for all meters except median for One Touch  PRE-MEAL Fasting Lunch Dinner Bedtime Overall  Glucose range: 65-114  65-123  89-141   60-167    Mean/median: 98   123  127  112            Meals: Breakfast variable, Lunch 2-3pm  usually avoiding high-fat meals, dinner 6 pm        Physical activity: exercise: irregular walking, legs weak          Dietician visit: Most recent: 2013   Weight control:  Wt Readings from Last 3 Encounters:  04/29/15 187 lb (84.823 kg)  04/12/15 179 lb (81.194 kg)  03/25/15 179 lb (81.194 kg)          Diabetes labs:  Lab Results  Component Value Date   HGBA1C 7.8* 02/16/2015   HGBA1C 6.4 11/16/2014   HGBA1C 6.2 08/11/2014   Lab Results  Component Value Date   MICROALBUR 13.0* 11/16/2014   LDLCALC 46 03/16/2015   CREATININE 0.86 04/26/2015   Lab on 04/26/2015  Component Date Value Ref Range Status  . Sodium 04/26/2015 140  135 - 145 mEq/L Final  . Potassium 04/26/2015 4.2  3.5 - 5.1 mEq/L Final  . Chloride 04/26/2015 105  96 - 112 mEq/L Final  . CO2 04/26/2015 28  19 - 32 mEq/L Final  . Glucose, Bld 04/26/2015 97  70 - 99 mg/dL Final  . BUN 04/26/2015 9  6 -  23 mg/dL Final  . Creatinine, Ser 04/26/2015 0.86  0.40 - 1.50 mg/dL Final  . Calcium 04/26/2015 9.2  8.4 - 10.5 mg/dL Final  . GFR 04/26/2015 117.68  >60.00 mL/min Final  . Fructosamine 04/26/2015 249  0 - 285 umol/L Final   Comment: Published reference interval for apparently healthy subjects between age 32 and 100 is 44 - 285 umol/L and in a poorly controlled diabetic population is 228 - 563 umol/L with a mean of 396 umol/L.            Medication List       This list is accurate as of: 04/29/15 11:59 PM.  Always use your most recent med list.               ACCU-CHEK FASTCLIX LANCETS Misc  USE  TO CHECK BLOOD SUGAR THREE TIMES DAILY     ACCU-CHEK SMARTVIEW test strip  Generic drug:  glucose blood  USE AS DIRECTED  TO CHECK BLOOD SUGAR THREE TIMES DAILY     aspirin EC 81 MG tablet  Take 81 mg by mouth every morning.     cetirizine 10 MG tablet  Commonly known as:  ZYRTEC  Take 10 mg by mouth  daily.     clonazePAM 1 MG tablet  Commonly known as:  KLONOPIN  Take 1 tablet (1 mg total) by mouth 3 (three) times daily as needed for anxiety.     COD LIVER OIL PO  Take 1 capsule by mouth every morning.     COMPLERA 200-25-300 MG tablet  Generic drug:  emtricitabine-rilpivir-tenofovir DF  Take 1 tablet by mouth daily.     docusate sodium 100 MG capsule  Commonly known as:  COLACE  Take 100 mg by mouth every morning.     gabapentin 300 MG capsule  Commonly known as:  NEURONTIN  take 1 capsule by mouth three times a day     Insulin Pen Needle 31G X 8 MM Misc  Commonly known as:  B-D ULTRAFINE III SHORT PEN  Use to administer insulin Dx E11.9     irbesartan 300 MG tablet  Commonly known as:  AVAPRO  Take 1 tablet (300 mg total) by mouth daily.     LANTUS SOLOSTAR 100 UNIT/ML Solostar Pen  Generic drug:  Insulin Glargine  inject 55 units subcutaneously every morning and 60 units every evening     Melatonin 3 MG Tabs  Take by mouth at bedtime.     multivitamin tablet  Take 1 tablet by mouth daily.     nebivolol 10 MG tablet  Commonly known as:  BYSTOLIC  Take 2 tablets (20 mg total) by mouth daily.     NOVOLOG FLEXPEN 100 UNIT/ML FlexPen  Generic drug:  insulin aspart  INJECT 20 TO 25 UNITS SUBCUTANEOUSLY THREE TIMES A DAY WITH MEALS     pioglitazone 30 MG tablet  Commonly known as:  ACTOS  take 1 tablet by mouth once daily     pravastatin 40 MG tablet  Commonly known as:  PRAVACHOL  take 1 tablet by mouth every evening     sertraline 100 MG tablet  Commonly known as:  ZOLOFT  Take 1 tablet (100 mg total) by mouth daily before breakfast.     sildenafil 20 MG tablet  Commonly known as:  REVATIO  Take 3 as needed     traZODone 100 MG tablet  Commonly known as:  DESYREL  take 2 tablets by mouth at bedtime  triamcinolone cream 0.5 %  Commonly known as:  KENALOG  Apply 1 application topically 3 (three) times daily.        Allergies:  Allergies    Allergen Reactions  . Benazepril Cough  . Glipizide Nausea Only    Past Medical History  Diagnosis Date  . HIV infection ([place])   . Hypertension   . Diabetes mellitus without complication ([place])   . Depression   . Anxiety   . Hypercholesterolemia   . Cataract     right   . DM (diabetes mellitus screen)   . HIV disease ([place]) 09/12/1995  . Hyperlipidemia     Past Surgical History  Procedure Laterality Date  . Eye surgery  2005    catarct surgery  . Optic lens surgery  2005  . Colonoscopy with propofol  01/16/2012    Procedure: COLONOSCOPY WITH PROPOFOL;  Surgeon: Garlan Fair, MD;  Location: [facility];  Service: Endoscopy;  Laterality: N/A;    Family History  Problem Relation Age of Onset  . Arthritis Mother   . Diabetes Mother   . Heart disease Mother   . Arthritis Father   . Diabetes Father   . Heart disease Father     Social History:  reports that he has been smoking Cigarettes.  He has been smoking about 0.25 packs per day. He has never used smokeless tobacco. He reports that he does not drink alcohol or use illicit drugs.  Review of Systems:   Hypertension:  currently on atenolol and on benazepril, not clear why Bystolic has also been prescribed Followed by PCP  Lipids: LDL is controlled with taking pravastatin HDL still low  Lab Results  Component Value Date   CHOL 102 03/16/2015   HDL 38.20* 03/16/2015   LDLCALC 46 03/16/2015   TRIG 89.0 03/16/2015   CHOLHDL 3 03/16/2015    He has symptoms of neuropathy with paresthesia in feet and legs  Recently symptoms are well controlled with 300 mg of gabapentin, 3 daily.   Depression: Not well-controlled, followed by psychiatrist    LABS:  Lab on 04/26/2015  Component Date Value Ref Range Status  . Sodium 04/26/2015 140  135 - 145 mEq/L Final  . Potassium 04/26/2015 4.2  3.5 - 5.1 mEq/L Final  . Chloride 04/26/2015 105  96 - 112 mEq/L Final  . CO2 04/26/2015 28  19 - 32 mEq/L Final  . Glucose, Bld  04/26/2015 97  70 - 99 mg/dL Final  . BUN 04/26/2015 9  6 - 23 mg/dL Final  . Creatinine, Ser 04/26/2015 0.86  0.40 - 1.50 mg/dL Final  . Calcium 04/26/2015 9.2  8.4 - 10.5 mg/dL Final  . GFR 04/26/2015 117.68  >60.00 mL/min Final  . Fructosamine 04/26/2015 249  0 - 285 umol/L Final   Comment: Published reference interval for apparently healthy subjects between age 74 and 34 is 65 - 285 umol/L and in a poorly controlled diabetic population is 228 - 563 umol/L with a mean of 396 umol/L.      Examination:   BP 140/80 mmHg  Pulse 50  Temp(Src) 97.7 F (36.5 C)  Resp 14  Ht 6' (1.829 m)  Wt 187 lb (84.823 kg)  BMI 25.36 kg/m2  SpO2 95%  Body mass index is 25.36 kg/(m^2).     ASSESSMENT/ PLAN:    Diabetes type 2:  See history of present illness for detailed discussion of his current management, blood sugar patterns and problems identified His A1c is higher  than usual His blood sugars are seen in fairly high throughout the day especially in the evenings This is likely to be from noncompliance with his insulin even though he is eating less because of decreased appetite He does not understand the need to take insistent amounts of basal insulin even if he is not eating since he is insulin resistant. He probably needs somewhat less Novolog based on his meal size but not skip it completely also Discussed needing to go back to same dose of LANTUS that he was taking before since even fasting blood sugars are high  Hypertension: Followed by PCP, advised him to follow-up with PCP     Patient Instructions  REDUCE PM DOSE OF LANTUS TO 55 ALSO  Check blood sugars on waking up 4-5  times a week Also check blood sugars about 2 hours after a meal and do this after different meals by rotation  Recommended blood sugar levels on waking up is 90-130 and about 2 hours after meal is 130-160  Please bring your blood sugar monitor to each visit, thank you     Counseling time on subjects  discussed above is over 50% of today's 25 minute visit    Luara Faye 04/30/2015, 9:12 AM             Patient ID: Nicholas Whitehead, male   DOB: [birth year], 60 y.o.   MRN: [id number]   Reason for Appointment: Type II Diabetes follow-up   History of Present Illness   Diagnosis date: 1998    Previous history: He was started on oral agents at diagnosis and around 2006 was put on insulin His blood sugars are usually well controlled when he takes Actos along with his insulin. He has been on Invokana since 04/2013  Recent history:   Insulin regimen: Lantus 50-55 units bid;  Novolog  20 AC tid         He is on a regimen of basal bolus insulin with Actos. He has usually been requiring large doses of insulin especially  Lantus which he takes twice a day  With this his blood sugars are significantly improved and A1c is below 7 usually  However on this visit his blood sugars are out of control and recently averaging over 200 A1c is relatively higher at 7.8, previously 6.4  Current management, blood sugar patterns and problems:  His blood sugars are higher most of the time and relatively higher in the evenings  He is taking somewhat less Lantus than prescribed previously  Also after some discussion he reveals that he is not taking his Lantus consistently  He will skip his Lantus especially when he is not having much of an appetite  Despite reduced appetite his weight is not much different  He says he is compliant with his Actos  Taking  consistent doses of Novolog of 20 units at each meal even with differences in his meal size and appetite  No hypoglycemia  Oral hypoglycemic drugs: Actos 30 mg    Side effects from medications:  urgency, frequent urination from Invokana Proper timing of medications in relation to meals: Yes.          Monitors blood glucose:  1-2 times a day  Glucometer:  Accu-Chek  Mean values apply above for all meters except median for One Touch  PRE-MEAL  Fasting Lunch Dinner Bedtime Overall  Glucose range:  157-229   124-234   217   200-313    Mean/median:      206  Meals: 3 meals per day. Lunch 2-3pm  usually avoiding high-fat meals, dinner 6 pm        Physical activity: exercise: irregular walking, legs weak          Dietician visit: Most recent: 2013   Weight control:  Wt Readings from Last 3 Encounters:  04/29/15 187 lb (84.823 kg)  04/12/15 179 lb (81.194 kg)  03/25/15 179 lb (81.194 kg)          Diabetes labs:  Lab Results  Component Value Date   HGBA1C 7.8* 02/16/2015   HGBA1C 6.4 11/16/2014   HGBA1C 6.2 08/11/2014   Lab Results  Component Value Date   MICROALBUR 13.0* 11/16/2014   LDLCALC 46 03/16/2015   CREATININE 0.86 04/26/2015   Lab on 04/26/2015  Component Date Value Ref Range Status  . Sodium 04/26/2015 140  135 - 145 mEq/L Final  . Potassium 04/26/2015 4.2  3.5 - 5.1 mEq/L Final  . Chloride 04/26/2015 105  96 - 112 mEq/L Final  . CO2 04/26/2015 28  19 - 32 mEq/L Final  . Glucose, Bld 04/26/2015 97  70 - 99 mg/dL Final  . BUN 04/26/2015 9  6 - 23 mg/dL Final  . Creatinine, Ser 04/26/2015 0.86  0.40 - 1.50 mg/dL Final  . Calcium 04/26/2015 9.2  8.4 - 10.5 mg/dL Final  . GFR 04/26/2015 117.68  >60.00 mL/min Final  . Fructosamine 04/26/2015 249  0 - 285 umol/L Final   Comment: Published reference interval for apparently healthy subjects between age 64 and 67 is 43 - 285 umol/L and in a poorly controlled diabetic population is 228 - 563 umol/L with a mean of 396 umol/L.        Medication List       This list is accurate as of: 04/29/15 11:59 PM.  Always use your most recent med list.               ACCU-CHEK FASTCLIX LANCETS Misc  USE  TO CHECK BLOOD SUGAR THREE TIMES DAILY     ACCU-CHEK SMARTVIEW test strip  Generic drug:  glucose blood  USE AS DIRECTED  TO CHECK BLOOD SUGAR THREE TIMES DAILY     aspirin EC 81 MG tablet  Take 81 mg by mouth every morning.     cetirizine 10 MG  tablet  Commonly known as:  ZYRTEC  Take 10 mg by mouth daily.     clonazePAM 1 MG tablet  Commonly known as:  KLONOPIN  Take 1 tablet (1 mg total) by mouth 3 (three) times daily as needed for anxiety.     COD LIVER OIL PO  Take 1 capsule by mouth every morning.     COMPLERA 200-25-300 MG tablet  Generic drug:  emtricitabine-rilpivir-tenofovir DF  Take 1 tablet by mouth daily.     docusate sodium 100 MG capsule  Commonly known as:  COLACE  Take 100 mg by mouth every morning.     gabapentin 300 MG capsule  Commonly known as:  NEURONTIN  take 1 capsule by mouth three times a day     Insulin Pen Needle 31G X 8 MM Misc  Commonly known as:  B-D ULTRAFINE III SHORT PEN  Use to administer insulin Dx E11.9     irbesartan 300 MG tablet  Commonly known as:  AVAPRO  Take 1 tablet (300 mg total) by mouth daily.     LANTUS SOLOSTAR 100 UNIT/ML Solostar Pen  Generic drug:  Insulin Glargine  inject 55  units subcutaneously every morning and 60 units every evening     Melatonin 3 MG Tabs  Take by mouth at bedtime.     multivitamin tablet  Take 1 tablet by mouth daily.     nebivolol 10 MG tablet  Commonly known as:  BYSTOLIC  Take 2 tablets (20 mg total) by mouth daily.     NOVOLOG FLEXPEN 100 UNIT/ML FlexPen  Generic drug:  insulin aspart  INJECT 20 TO 25 UNITS SUBCUTANEOUSLY THREE TIMES A DAY WITH MEALS     pioglitazone 30 MG tablet  Commonly known as:  ACTOS  take 1 tablet by mouth once daily     pravastatin 40 MG tablet  Commonly known as:  PRAVACHOL  take 1 tablet by mouth every evening     sertraline 100 MG tablet  Commonly known as:  ZOLOFT  Take 1 tablet (100 mg total) by mouth daily before breakfast.     sildenafil 20 MG tablet  Commonly known as:  REVATIO  Take 3 as needed     traZODone 100 MG tablet  Commonly known as:  DESYREL  take 2 tablets by mouth at bedtime     triamcinolone cream 0.5 %  Commonly known as:  KENALOG  Apply 1 application topically 3  (three) times daily.        Allergies:  Allergies  Allergen Reactions  . Benazepril Cough  . Glipizide Nausea Only    Past Medical History  Diagnosis Date  . HIV infection ([place])   . Hypertension   . Diabetes mellitus without complication ([place])   . Depression   . Anxiety   . Hypercholesterolemia   . Cataract     right   . DM (diabetes mellitus screen)   . HIV disease ([place]) 09/12/1995  . Hyperlipidemia     Past Surgical History  Procedure Laterality Date  . Eye surgery  2005    catarct surgery  . Optic lens surgery  2005  . Colonoscopy with propofol  01/16/2012    Procedure: COLONOSCOPY WITH PROPOFOL;  Surgeon: Garlan Fair, MD;  Location: [facility];  Service: Endoscopy;  Laterality: N/A;    Family History  Problem Relation Age of Onset  . Arthritis Mother   . Diabetes Mother   . Heart disease Mother   . Arthritis Father   . Diabetes Father   . Heart disease Father     Social History:  reports that he has been smoking Cigarettes.  He has been smoking about 0.25 packs per day. He has never used smokeless tobacco. He reports that he does not drink alcohol or use illicit drugs.  Review of Systems:   Hypertension:  currently on atenolol, Bystolic and on benazepril, not clear why Bystolic has also been prescribed Followed by PCP  Lipids: LDL is controlled with taking pravastatin HDL still low  Lab Results  Component Value Date   CHOL 102 03/16/2015   HDL 38.20* 03/16/2015   LDLCALC 46 03/16/2015   TRIG 89.0 03/16/2015   CHOLHDL 3 03/16/2015    He has symptoms of neuropathy with paresthesia in feet and legs  Usually symptoms are well controlled with 300 mg of gabapentin, 3 daily.   Depression: Not well-controlled, followed by psychiatrist    LABS:  Lab on 04/26/2015  Component Date Value Ref Range Status  . Sodium 04/26/2015 140  135 - 145 mEq/L Final  . Potassium 04/26/2015 4.2  3.5 - 5.1 mEq/L Final  . Chloride 04/26/2015 105  96 -  112 mEq/L  Final  . CO2 04/26/2015 28  19 - 32 mEq/L Final  . Glucose, Bld 04/26/2015 97  70 - 99 mg/dL Final  . BUN 04/26/2015 9  6 - 23 mg/dL Final  . Creatinine, Ser 04/26/2015 0.86  0.40 - 1.50 mg/dL Final  . Calcium 04/26/2015 9.2  8.4 - 10.5 mg/dL Final  . GFR 04/26/2015 117.68  >60.00 mL/min Final  . Fructosamine 04/26/2015 249  0 - 285 umol/L Final   Comment: Published reference interval for apparently healthy subjects between age 55 and 34 is 11 - 285 umol/L and in a poorly controlled diabetic population is 228 - 563 umol/L with a mean of 396 umol/L.      Examination:   BP 140/80 mmHg  Pulse 50  Temp(Src) 97.7 F (36.5 C)  Resp 14  Ht 6' (1.829 m)  Wt 187 lb (84.823 kg)  BMI 25.36 kg/m2  SpO2 95%  Body mass index is 25.36 kg/(m^2).     ASSESSMENT/ PLAN:    Diabetes type 2:  See history of present illness for detailed discussion of his current management, blood sugar patterns and problems identified With his getting back on his diet and taking insulin more consistently with improved motivation his blood sugars are back to excellent levels Fructosamine is mid normal indicating overall good control However he is getting excessive dose of Lantus in the evening with periodic readings in the 60s overnight than morning sugar averaging only 98 He has also gained weight from bringing his sugars down with his high-dose insulin regimen  For now he will reduce his evening Lantus to 55 and can do the same insulin doses otherwise  Hypertension: Followed by PCP    Patient Instructions  REDUCE PM DOSE OF LANTUS TO 55 ALSO  Check blood sugars on waking up 4-5  times a week Also check blood sugars about 2 hours after a meal and do this after different meals by rotation  Recommended blood sugar levels on waking up is 90-130 and about 2 hours after meal is 130-160  Please bring your blood sugar monitor to each visit, thank you     Counseling time on subjects discussed above is  over 50% of today's 25 minute visit    Lateria Alderman 04/30/2015, 9:12 AM

## 2015-04-30 DIAGNOSIS — Z794 Long term (current) use of insulin: Principal | ICD-10-CM

## 2015-04-30 DIAGNOSIS — E1140 Type 2 diabetes mellitus with diabetic neuropathy, unspecified: Secondary | ICD-10-CM | POA: Insufficient documentation

## 2015-05-05 ENCOUNTER — Other Ambulatory Visit: Payer: Self-pay | Admitting: Endocrinology

## 2015-05-06 ENCOUNTER — Ambulatory Visit (INDEPENDENT_AMBULATORY_CARE_PROVIDER_SITE_OTHER): Payer: Commercial Managed Care - HMO | Admitting: Internal Medicine

## 2015-05-06 ENCOUNTER — Encounter: Payer: Self-pay | Admitting: Internal Medicine

## 2015-05-06 VITALS — BP 144/78 | HR 65 | Temp 98.5°F | Resp 16 | Ht 72.0 in | Wt 187.0 lb

## 2015-05-06 DIAGNOSIS — G3184 Mild cognitive impairment, so stated: Secondary | ICD-10-CM

## 2015-05-06 DIAGNOSIS — J410 Simple chronic bronchitis: Secondary | ICD-10-CM

## 2015-05-06 MED ORDER — UMECLIDINIUM-VILANTEROL 62.5-25 MCG/INH IN AEPB: 1.0000 | INHALATION_SPRAY | Freq: Every day | RESPIRATORY_TRACT | Status: DC

## 2015-05-06 NOTE — Patient Instructions (Signed)

## 2015-05-06 NOTE — Progress Notes (Signed)
Subjective:  Patient ID: Nicholas Whitehead, male    DOB: 05-07-1955  Age: 60 y.o. MRN: [id number]  CC: Cough   HPI Nicholas Whitehead presents for recurrent episodes of nonproductive cough, shortness of breath at rest, wheezing, and continued tobacco abuse. He had a normal chest x-ray about a month ago. He had a normal Lexiscan done a few weeks ago so it does not appear that his symptoms are related to cardiac ischemia. He denies hemoptysis, night sweats, fever, chills, weight loss, or loss of appetite.  He also complains of episodes are for forgetfulness and declining memory. He wants to see a neurologist for further evaluation.  Outpatient Prescriptions Prior to Visit  Medication Sig Dispense Refill  . ACCU-CHEK FASTCLIX LANCETS MISC USE  TO CHECK BLOOD SUGAR THREE TIMES DAILY 306 each 1  . ACCU-CHEK SMARTVIEW test strip USE AS DIRECTED  TO CHECK BLOOD SUGAR THREE TIMES DAILY 300 each 1  . aspirin EC 81 MG tablet Take 81 mg by mouth every morning.    . cetirizine (ZYRTEC) 10 MG tablet Take 10 mg by mouth daily.    . clonazePAM (KLONOPIN) 1 MG tablet Take 1 tablet (1 mg total) by mouth 3 (three) times daily as needed for anxiety. 90 tablet 3  . COD LIVER OIL PO Take 1 capsule by mouth every morning.    . COMPLERA 200-25-300 MG tablet Take 1 tablet by mouth daily. 30 tablet 5  . docusate sodium (COLACE) 100 MG capsule Take 100 mg by mouth every morning.    . gabapentin (NEURONTIN) 300 MG capsule take 1 capsule by mouth three times a day 90 capsule 3  . Insulin Pen Needle (B-D ULTRAFINE III SHORT PEN) 31G X 8 MM MISC Use to administer insulin Dx E11.9 300 each 3  . irbesartan (AVAPRO) 300 MG tablet Take 1 tablet (300 mg total) by mouth daily. 90 tablet 3  . LANTUS SOLOSTAR 100 UNIT/ML Solostar Pen inject 55 units subcutaneously every morning and 60 units every evening 30 mL 3  . Melatonin 3 MG TABS Take by mouth at bedtime.    . Multiple Vitamin (MULTIVITAMIN) tablet Take 1 tablet by mouth daily.       . nebivolol (BYSTOLIC) 10 MG tablet Take 2 tablets (20 mg total) by mouth daily. 60 tablet 11  . NOVOLOG FLEXPEN 100 UNIT/ML FlexPen INJECT 20 TO 25 UNITS SUBCUTANEOUSLY THREE TIMES A DAY WITH MEALS 30 mL 2  . pioglitazone (ACTOS) 30 MG tablet take 1 tablet by mouth once daily 30 tablet 3  . pravastatin (PRAVACHOL) 40 MG tablet take 1 tablet by mouth every evening 30 tablet 4  . sertraline (ZOLOFT) 100 MG tablet Take 1 tablet (100 mg total) by mouth daily before breakfast. 30 tablet 11  . sildenafil (REVATIO) 20 MG tablet Take 3 as needed 20 tablet 1  . traZODone (DESYREL) 100 MG tablet take 2 tablets by mouth at bedtime 60 tablet 11  . triamcinolone cream (KENALOG) 0.5 % Apply 1 application topically 3 (three) times daily. 100 g 1   No facility-administered medications prior to visit.    ROS Review of Systems  Constitutional: Negative.  Negative for fever, chills, diaphoresis, fatigue and unexpected weight change.  HENT: Negative.  Negative for sinus pressure and trouble swallowing.   Eyes: Negative.   Respiratory: Positive for cough, shortness of breath and wheezing. Negative for choking, chest tightness and stridor.   Cardiovascular: Negative.  Negative for chest pain, palpitations and leg swelling.  Gastrointestinal:  Negative.  Negative for nausea, vomiting, abdominal pain, diarrhea, constipation and blood in stool.  Endocrine: Negative.   Genitourinary: Negative.   Musculoskeletal: Negative.   Skin: Negative.  Negative for color change and rash.  Allergic/Immunologic: Negative.   Neurological: Negative.  Negative for dizziness, tremors, syncope, light-headedness and numbness.  Hematological: Negative.  Negative for adenopathy. Does not bruise/bleed easily.  Psychiatric/Behavioral: Positive for decreased concentration. Negative for suicidal ideas, confusion, sleep disturbance, self-injury, dysphoric mood and agitation. The patient is nervous/anxious.     Objective:  BP 144/78  mmHg  Pulse 65  Temp(Src) 98.5 F (36.9 C) (Oral)  Resp 16  Ht 6' (1.829 m)  Wt 187 lb (84.823 kg)  BMI 25.36 kg/m2  SpO2 94%  BP Readings from Last 3 Encounters:  05/06/15 144/78  04/29/15 140/80  03/25/15 148/88    Wt Readings from Last 3 Encounters:  05/06/15 187 lb (84.823 kg)  04/29/15 187 lb (84.823 kg)  04/12/15 179 lb (81.194 kg)    Physical Exam  Constitutional: He is oriented to person, place, and time. He appears well-developed and well-nourished. No distress.  HENT:  Mouth/Throat: Oropharynx is clear and moist. No oropharyngeal exudate.  Eyes: Conjunctivae are normal. Right eye exhibits no discharge. Left eye exhibits no discharge. No scleral icterus.  Neck: Normal range of motion. Neck supple. No JVD present. No tracheal deviation present. No thyromegaly present.  Cardiovascular: Normal rate, regular rhythm, normal heart sounds and intact distal pulses.  Exam reveals no gallop and no friction rub.   No murmur heard. Pulmonary/Chest: Effort normal and breath sounds normal. No stridor. No respiratory distress. He has no wheezes. He has no rales. He exhibits no tenderness.  Abdominal: Soft. Bowel sounds are normal. He exhibits no distension and no mass. There is no tenderness. There is no rebound and no guarding.  Musculoskeletal: Normal range of motion. He exhibits no edema or tenderness.  Lymphadenopathy:    He has no cervical adenopathy.  Neurological: He is oriented to person, place, and time.  Skin: Skin is warm and dry. No rash noted. He is not diaphoretic. No erythema. No pallor.  Vitals reviewed.   Lab Results  Component Value Date   WBC 6.7 09/22/2014   HGB 16.2 09/22/2014   HCT 46.2 09/22/2014   PLT 127* 09/22/2014   GLUCOSE 97 04/26/2015   CHOL 102 03/16/2015   TRIG 89.0 03/16/2015   HDL 38.20* 03/16/2015   LDLCALC 46 03/16/2015   ALT 31 02/16/2015   AST 39* 02/16/2015   NA 140 04/26/2015   K 4.2 04/26/2015   CL 105 04/26/2015   CREATININE  0.86 04/26/2015   BUN 9 04/26/2015   CO2 28 04/26/2015   TSH 0.93 03/25/2015   PSA 0.82 03/25/2015   HGBA1C 7.8* 02/16/2015   MICROALBUR 13.0* 11/16/2014    Dg Chest 2 View  03/25/2015  CLINICAL DATA:  Routine checkup.  Cough. EXAM: CHEST  2 VIEW COMPARISON:  March 23, 2013 FINDINGS: No pneumothorax. The heart is unremarkable. The hila and mediastinum are normal. No pulmonary nodules, masses, or infiltrates. A healed left rib fracture is identified. IMPRESSION: No active cardiopulmonary disease. Electronically Signed   By: Dorise Bullion III M.D   On: 03/25/2015 14:03    Assessment & Plan:   Jaquaylon was seen today for cough.  Diagnoses and all orders for this visit:  Simple chronic bronchitis ([place])- he was encouraged, and agrees to try to quit smoking, will treat the chronic bronchitis with a combination  of a LABA and an anti-cholinergic. -     umeclidinium-vilanterol (ANORO ELLIPTA) 62.5-25 MCG/INH AEPB; Inhale 1 puff into the lungs daily. -     Pulmonary Function Test; Future  Mild cognitive impairment- neurology referral to screen for dementia. -     Ambulatory referral to Neurology -     DME Other see comment   I am having Mr. Frasier start on umeclidinium-vilanterol. I am also having him maintain his multivitamin, cetirizine, aspirin EC, COD LIVER OIL PO, docusate sodium, Melatonin, Insulin Pen Needle, nebivolol, traZODone, NOVOLOG FLEXPEN, pravastatin, sertraline, gabapentin, sildenafil, irbesartan, clonazePAM, COMPLERA, ACCU-CHEK FASTCLIX LANCETS, ACCU-CHEK SMARTVIEW, LANTUS SOLOSTAR, triamcinolone cream, and pioglitazone.  Meds ordered this encounter  Medications  . umeclidinium-vilanterol (ANORO ELLIPTA) 62.5-25 MCG/INH AEPB    Sig: Inhale 1 puff into the lungs daily.    Dispense:  30 each    Refill:  11     Follow-up: Return in about 6 months (around 11/05/2015).  Scarlette Calico, MD

## 2015-05-06 NOTE — Progress Notes (Signed)
Pre visit review using our clinic review tool, if applicable. No additional management support is needed unless otherwise documented below in the visit note. 

## 2015-05-11 ENCOUNTER — Ambulatory Visit: Payer: Commercial Managed Care - HMO

## 2015-05-11 DIAGNOSIS — F32A Depression, unspecified: Secondary | ICD-10-CM

## 2015-05-11 DIAGNOSIS — F329 Major depressive disorder, single episode, unspecified: Secondary | ICD-10-CM

## 2015-05-11 NOTE — BH Specialist Note (Signed)
Kingdom was in much better spirits today, laughing and joking as he often does.  He reported that everything was worked out with his apartment. He talked about all the medical appointments he has to go to and that otherwise he rarely leaves his place. He said he now has an inhaler that he uses once a day, in addition to his many other medications. Overall, he seems to be doing better.  Plan to meet in 3 weeks. Curley Spice, LCSW

## 2015-05-14 ENCOUNTER — Ambulatory Visit (INDEPENDENT_AMBULATORY_CARE_PROVIDER_SITE_OTHER): Payer: Commercial Managed Care - HMO | Admitting: Podiatry

## 2015-05-14 ENCOUNTER — Encounter: Payer: Self-pay | Admitting: Podiatry

## 2015-05-14 DIAGNOSIS — Q828 Other specified congenital malformations of skin: Secondary | ICD-10-CM | POA: Diagnosis not present

## 2015-05-14 DIAGNOSIS — M79673 Pain in unspecified foot: Secondary | ICD-10-CM

## 2015-05-14 DIAGNOSIS — B351 Tinea unguium: Secondary | ICD-10-CM | POA: Diagnosis not present

## 2015-05-14 DIAGNOSIS — E1151 Type 2 diabetes mellitus with diabetic peripheral angiopathy without gangrene: Secondary | ICD-10-CM

## 2015-05-14 NOTE — Progress Notes (Signed)
Patient ID: Nicholas Whitehead, male   DOB: 03-Mar-1955, 60 y.o.   MRN: [id number] Complaint:  Visit Type: Patient returns to my office for continued preventative foot care services. Complaint: Patient states" my nails have grown long and thick and become painful to walk and wear shoes" Patient has been diagnosed with DM with no foot complications. The patient presents for preventative foot care services. No changes to ROS.  Patient also has heel callus B/L.  Podiatric Exam: Vascular: dorsalis pedis and posterior tibial pulses are not  palpable bilateral. Capillary return is immediate. Cold feet noted. Sensorium: Absent Semmes Weinstein monofilament test. Nail Exam: Pt has thick disfigured discolored nails with subungual debris noted bilateral entire nail hallux through fifth toenails Ulcer Exam: There is no evidence of ulcer or pre-ulcerative changes or infection. Orthopedic Exam: Muscle tone and strength are WNL. No limitations in general ROM. No crepitus or effusions noted. Foot type and digits show no abnormalities. Bony prominences are unremarkable. Skin: No Porokeratosis. No infection or ulcers.  Plantar heel tyloma noted B/L  Diagnosis:  Onychomycosis, , Pain in right toe, pain in left toes,  Heel callus B/L  Treatment & Plan Procedures and Treatment: Consent by patient was obtained for treatment procedures. The patient understood the discussion of treatment and procedures well. All questions were answered thoroughly reviewed. Debridement of mycotic and hypertrophic toenails, 1 through 5 bilateral and clearing of subungual debris. No ulceration, no infection noted. Debride callus B/L. Return Visit-Office Procedure: Patient instructed to return to the office for a follow up visit 3 months for continued evaluation and treatment.    Gardiner Barefoot DPM

## 2015-05-19 ENCOUNTER — Telehealth: Payer: Self-pay

## 2015-05-19 ENCOUNTER — Telehealth: Payer: Self-pay | Admitting: Endocrinology

## 2015-05-19 ENCOUNTER — Other Ambulatory Visit: Payer: Self-pay

## 2015-05-19 MED ORDER — INSULIN PEN NEEDLE 31G X 8 MM MISC: Status: DC

## 2015-05-19 NOTE — Telephone Encounter (Signed)
Pt called and wanted to schedule Pulmonary Function Test.   Gave pt number to pulmonary to schedule. Pt stated that he would contact directly.

## 2015-05-19 NOTE — Telephone Encounter (Signed)
Patient ask you to give him a call °

## 2015-05-21 ENCOUNTER — Telehealth: Payer: Self-pay | Admitting: Internal Medicine

## 2015-05-21 NOTE — Telephone Encounter (Signed)
Pt called want to let our office know that he see Dr. Quay Burow at [facility]. FYI

## 2015-05-24 NOTE — Telephone Encounter (Signed)
Will he send me a report?

## 2015-05-25 ENCOUNTER — Other Ambulatory Visit: Payer: Self-pay | Admitting: Endocrinology

## 2015-05-26 NOTE — Telephone Encounter (Signed)
Called and report will be faxed over

## 2015-05-31 ENCOUNTER — Other Ambulatory Visit (INDEPENDENT_AMBULATORY_CARE_PROVIDER_SITE_OTHER): Payer: Commercial Managed Care - HMO

## 2015-05-31 DIAGNOSIS — E1140 Type 2 diabetes mellitus with diabetic neuropathy, unspecified: Secondary | ICD-10-CM

## 2015-05-31 DIAGNOSIS — Z794 Long term (current) use of insulin: Secondary | ICD-10-CM | POA: Diagnosis not present

## 2015-05-31 LAB — COMPREHENSIVE METABOLIC PANEL
ALBUMIN: 4.2 g/dL (ref 3.5–5.2)
ALT: 15 U/L (ref 0–53)
AST: 22 U/L (ref 0–37)
Alkaline Phosphatase: 44 U/L (ref 39–117)
BUN: 8 mg/dL (ref 6–23)
CO2: 31 meq/L (ref 19–32)
CREATININE: 0.86 mg/dL (ref 0.40–1.50)
Calcium: 9.5 mg/dL (ref 8.4–10.5)
Chloride: 105 mEq/L (ref 96–112)
GFR: 117.64 mL/min (ref >60.00)
Glucose, Bld: 76 mg/dL (ref 70–99)
Potassium: 4 mEq/L (ref 3.5–5.1)
SODIUM: 140 meq/L (ref 135–145)
Total Bilirubin: 0.3 mg/dL (ref 0.2–1.2)
Total Protein: 8 g/dL (ref 6.0–8.3)

## 2015-05-31 LAB — HEMOGLOBIN A1C: Hgb A1c MFr Bld: 6.3 % (ref 4.6–6.5)

## 2015-06-01 ENCOUNTER — Ambulatory Visit: Payer: Commercial Managed Care - HMO

## 2015-06-01 LAB — FRUCTOSAMINE: FRUCTOSAMINE: 249 umol/L (ref 0–285)

## 2015-06-02 NOTE — Telephone Encounter (Signed)
Still have not received fax called again to get this done

## 2015-06-03 ENCOUNTER — Ambulatory Visit: Payer: Commercial Managed Care - HMO

## 2015-06-03 DIAGNOSIS — F32A Depression, unspecified: Secondary | ICD-10-CM

## 2015-06-03 DIAGNOSIS — F329 Major depressive disorder, single episode, unspecified: Secondary | ICD-10-CM

## 2015-06-03 NOTE — BH Specialist Note (Signed)
Nicholas Whitehead was moving rather slow today, but was in good spirits.  He said his sister and niece are moving to [place] in July and he is sad about this. This is the sister who used to live with him.  He said he does not get enough to eat lately.  I arranged with Nicholas Mustard, RN to get some food from the pantry and he was very appreciative. He talked about his depression and how it comes and goes.  Plan to meet again in 2 weeks. Nicholas Spice, LCSW

## 2015-06-16 ENCOUNTER — Ambulatory Visit: Payer: Commercial Managed Care - HMO

## 2015-06-18 ENCOUNTER — Encounter: Payer: Self-pay | Admitting: Internal Medicine

## 2015-06-23 ENCOUNTER — Ambulatory Visit (INDEPENDENT_AMBULATORY_CARE_PROVIDER_SITE_OTHER): Payer: Commercial Managed Care - HMO | Admitting: Internal Medicine

## 2015-06-23 DIAGNOSIS — J410 Simple chronic bronchitis: Secondary | ICD-10-CM | POA: Diagnosis not present

## 2015-06-23 LAB — PULMONARY FUNCTION TEST
DL/VA % pred: 98 %
DL/VA: 4.67 ml/min/mmHg/L
DLCO UNC % PRED: 64 %
DLCO UNC: 22.73 ml/min/mmHg
DLCO cor % pred: 67 %
DLCO cor: 23.49 ml/min/mmHg
FEF 25-75 PRE: 2.39 L/s
FEF 25-75 Post: 2.38 L/sec
FEF2575-%CHANGE-POST: 0 %
FEF2575-%Pred-Post: 74 %
FEF2575-%Pred-Pre: 74 %
FEV1-%Change-Post: 0 %
FEV1-%PRED-POST: 85 %
FEV1-%PRED-PRE: 85 %
FEV1-POST: 2.93 L
FEV1-PRE: 2.92 L
FEV1FVC-%CHANGE-POST: 1 %
FEV1FVC-%Pred-Pre: 96 %
FEV6-%CHANGE-POST: -1 %
FEV6-%PRED-POST: 88 %
FEV6-%PRED-PRE: 89 %
FEV6-POST: 3.74 L
FEV6-PRE: 3.78 L
FEV6FVC-%Change-Post: 0 %
FEV6FVC-%PRED-POST: 102 %
FEV6FVC-%PRED-PRE: 102 %
FVC-%CHANGE-POST: -1 %
FVC-%Pred-Post: 86 %
FVC-%Pred-Pre: 87 %
FVC-POST: 3.75 L
FVC-Pre: 3.81 L
POST FEV6/FVC RATIO: 100 %
Post FEV1/FVC ratio: 78 %
Pre FEV1/FVC ratio: 77 %
Pre FEV6/FVC Ratio: 99 %
RV % PRED: 88 %
RV: 2.04 L
TLC % PRED: 79 %
TLC: 5.85 L

## 2015-06-23 NOTE — Progress Notes (Signed)
PFT done today. 

## 2015-06-28 ENCOUNTER — Other Ambulatory Visit (INDEPENDENT_AMBULATORY_CARE_PROVIDER_SITE_OTHER): Payer: Commercial Managed Care - HMO

## 2015-06-28 ENCOUNTER — Ambulatory Visit (INDEPENDENT_AMBULATORY_CARE_PROVIDER_SITE_OTHER): Payer: Commercial Managed Care - HMO | Admitting: Neurology

## 2015-06-28 ENCOUNTER — Encounter: Payer: Self-pay | Admitting: Neurology

## 2015-06-28 VITALS — BP 136/74 | HR 64 | Ht 74.0 in | Wt 187.0 lb

## 2015-06-28 DIAGNOSIS — R413 Other amnesia: Secondary | ICD-10-CM | POA: Diagnosis not present

## 2015-06-28 DIAGNOSIS — J410 Simple chronic bronchitis: Secondary | ICD-10-CM

## 2015-06-28 DIAGNOSIS — F17200 Nicotine dependence, unspecified, uncomplicated: Secondary | ICD-10-CM | POA: Diagnosis not present

## 2015-06-28 LAB — VITAMIN B12: Vitamin B-12: 395 pg/mL (ref 211–911)

## 2015-06-28 NOTE — Progress Notes (Signed)
NEUROLOGY CONSULTATION NOTE  Nicholas Whitehead MRN: [id number] DOB: 19-Jul-1955  Referring provider: Dr. [name] Primary care provider: Dr. [name]  Reason for consult:  Cognitive impairment  HISTORY OF PRESENT ILLNESS: Nicholas Whitehead is a 60 year old right-handed man with tobacco [name], type 2 diabetes with neuropathy, HIV, hypertension, and hyperlipidemia who presents for cognitive impairment.  History obtained by patient and PCP note.  He reports some memory problems for over 20 years but it has gotten worse over the past year.  He reports specifically short-term memory problems.  He forgets dates, appointments, and conversations.  He has gotten into arguments with family members because he expected them to take him to an appointment after forgetting that they told him they would not be able to.  He denies forgetting familiar people's names and faces.  He lives alone.  He has forgotten to pay bills but denies difficulty with calculations when he pays them.  He will forget the chapters he just read in a book.  He denies forgetting how to perform daily activities or chores around the house.  He no longer drives due to disability.  He has been on disability since 1998 due to HIV, diabetes and neuropathy.  He lives by himself and is usually not social.  Sometimes, he will go periods without going outside except to the grocery store.  He has struggled with depression for many years.  He did have college.  He reports that his parents had memory problems and possibly dementia, however they both lived by themselves until they passed away.  An MRI of the brain with and without contrast from 09/21/06 was personally reviewed and was unremarkable.   As per labs from September, viral load is undectable with CDF T cell abs 600 and CD4T Helper T cell 34.  TSH from March was 0.93.  Labs from May showed normal CMP and Hgb A1c of 6.3.  PAST MEDICAL HISTORY: Past Medical History  Diagnosis Date  . HIV infection ([place])    . Hypertension   . Diabetes mellitus without complication ([place])   . Depression   . Anxiety   . Hypercholesterolemia   . Cataract     right   . DM (diabetes mellitus screen)   . HIV disease ([place]) 09/12/1995  . Hyperlipidemia     PAST SURGICAL HISTORY: Past Surgical History  Procedure Laterality Date  . Eye surgery  2005    catarct surgery  . Optic lens surgery  2005  . Colonoscopy with propofol  01/16/2012    Procedure: COLONOSCOPY WITH PROPOFOL;  Surgeon: Garlan Fair, MD;  Location: [facility];  Service: Endoscopy;  Laterality: N/A;    MEDICATIONS: Current Outpatient Prescriptions on File Prior to Visit  Medication Sig Dispense Refill  . ACCU-CHEK FASTCLIX LANCETS MISC USE  TO CHECK BLOOD SUGAR THREE TIMES DAILY 306 each 1  . ACCU-CHEK SMARTVIEW test strip USE AS DIRECTED  TO CHECK BLOOD SUGAR THREE TIMES DAILY 300 each 1  . aspirin EC 81 MG tablet Take 81 mg by mouth every morning.    . cetirizine (ZYRTEC) 10 MG tablet Take 10 mg by mouth daily.    . clonazePAM (KLONOPIN) 1 MG tablet Take 1 tablet (1 mg total) by mouth 3 (three) times daily as needed for anxiety. 90 tablet 3  . COD LIVER OIL PO Take 1 capsule by mouth every morning.    . COMPLERA 200-25-300 MG tablet Take 1 tablet by mouth daily. 30 tablet 5  . docusate  sodium (COLACE) 100 MG capsule Take 100 mg by mouth every morning.    . gabapentin (NEURONTIN) 300 MG capsule take 1 capsule by mouth three times a day 90 capsule 3  . Insulin Pen Needle (B-D ULTRAFINE III SHORT PEN) 31G X 8 MM MISC Use to administer insulin Dx E11.9 300 each 3  . irbesartan (AVAPRO) 300 MG tablet Take 1 tablet (300 mg total) by mouth daily. 90 tablet 3  . LANTUS SOLOSTAR 100 UNIT/ML Solostar Pen inject 55 units subcutaneously every morning and 60 units every evening 30 mL 3  . Melatonin 3 MG TABS Take by mouth at bedtime.    . Multiple Vitamin (MULTIVITAMIN) tablet Take 1 tablet by mouth daily.      . nebivolol (BYSTOLIC) 10 MG tablet  Take 2 tablets (20 mg total) by mouth daily. 60 tablet 11  . NOVOLOG FLEXPEN 100 UNIT/ML FlexPen inject 20 to 25 units subcutaneously three times a day with meals 30 mL 2  . pioglitazone (ACTOS) 30 MG tablet take 1 tablet by mouth once daily 30 tablet 3  . pravastatin (PRAVACHOL) 40 MG tablet take 1 tablet by mouth every evening 30 tablet 4  . sertraline (ZOLOFT) 100 MG tablet Take 1 tablet (100 mg total) by mouth daily before breakfast. 30 tablet 11  . sildenafil (REVATIO) 20 MG tablet Take 3 as needed 20 tablet 1  . traZODone (DESYREL) 100 MG tablet take 2 tablets by mouth at bedtime 60 tablet 11  . triamcinolone cream (KENALOG) 0.5 % Apply 1 application topically 3 (three) times daily. 100 g 1  . umeclidinium-vilanterol (ANORO ELLIPTA) 62.5-25 MCG/INH AEPB Inhale 1 puff into the lungs daily. 30 each 11   No current facility-administered medications on file prior to visit.    ALLERGIES: Allergies  Allergen Reactions  . Benazepril Cough  . Glipizide Nausea Only    FAMILY HISTORY: Family History  Problem Relation Age of Onset  . Arthritis Mother   . Diabetes Mother   . Heart disease Mother   . Stroke Mother   . Arthritis Father   . Diabetes Father   . Heart disease Father     SOCIAL HISTORY: Social History   Social History  . Marital Status: Single    Spouse Name: N/A  . Number of Children: N/A  . Years of Education: N/A   Occupational History  . Not on file.   Social History Main Topics  . Smoking status: Current Some Day Smoker -- 0.25 packs/day    Types: Cigarettes    Last Attempt to Quit: 01/09/2012  . Smokeless tobacco: Never Used  . Alcohol Use: No     Comment: socially  . Drug Use: No     Comment: rarely  . Sexual Activity: No     Comment: declined condoms   Other Topics Concern  . Not on file   Social History Narrative    REVIEW OF SYSTEMS: Constitutional: No fevers, chills, or sweats, no generalized fatigue, change in appetite Eyes: No visual  changes, double vision, eye pain Ear, nose and throat: No hearing loss, ear pain, nasal congestion, sore throat Cardiovascular: No chest pain, palpitations Respiratory:  No shortness of breath at rest or with exertion, wheezes GastrointestinaI: No nausea, vomiting, diarrhea, abdominal pain, fecal incontinence Genitourinary:  No dysuria, urinary retention or frequency Musculoskeletal:  No neck pain, back pain Integumentary: No rash, pruritus, skin lesions Neurological: as above Psychiatric: No depression, insomnia, anxiety Endocrine: No palpitations, fatigue, diaphoresis, mood swings, change  in appetite, change in weight, increased thirst Hematologic/Lymphatic:  No purpura, petechiae. Allergic/Immunologic: no itchy/runny eyes, nasal congestion, recent allergic reactions, rashes  PHYSICAL EXAM: Filed Vitals:   06/28/15 1453  BP: 136/74  Pulse: 64   General: No acute distress.   Head:  Normocephalic/atraumatic Eyes:  fundi examined but not visualized Neck: supple, no paraspinal tenderness, full range of motion Back: No paraspinal tenderness Heart: regular rate and rhythm Lungs: Clear to auscultation bilaterally. Vascular: No carotid bruits. Neurological Exam: Mental status: alert and oriented to person, place, and time, delayed recall 3/5, remote memory intact, fund of knowledge intact, attention and concentration intact, speech fluent and not dysarthric, language intact. Montreal Cognitive Assessment  06/28/2015  Visuospatial/ Executive (0/5) 3  Naming (0/3) 3  Attention: Read list of digits (0/2) 2  Attention: Read list of letters (0/1) 1  Attention: Serial 7 subtraction starting at 100 (0/3) 0  Language: Repeat phrase (0/2) 1  Language : Fluency (0/1) 1  Abstraction (0/2) 2  Delayed Recall (0/5) 3  Orientation (0/6) 6  Total 22  Adjusted Score (based on education) 22   Cranial nerves: CN I: not tested CN II: pupils equal, round and reactive to light, visual fields  intact CN III, IV, VI:  full range of motion, no nystagmus, no ptosis CN V: facial sensation intact CN VII: upper and lower face symmetric CN VIII: hearing intact CN IX, X: gag intact, uvula midline CN XI: sternocleidomastoid and trapezius muscles intact CN XII: tongue midline Bulk & Tone: normal, no fasciculations. Motor:  5/5 throughout Sensation:  Decreased pinprick and vibration sensation in the feet. Deep Tendon Reflexes: absent throughout, toes downgoing. Finger to nose testing:  Without dysmetria.  Heel to shin:  Without dysmetria.  Gait:  Cautious wide-based gait.  Able to turn, unable to tandem walk. Romberg with sway.  IMPRESSION: Memory difficulties  PLAN: 1.  Check MRI of brain and B12 2.  Refer for neuropsychological testing 3.  Smoking cessation 4.  Follow up after testing.  Thank you for allowing me to take part in the care of this patient.  Metta Clines, DO  CC:  Scarlette Calico, MD

## 2015-06-28 NOTE — Patient Instructions (Signed)
We will evaluate for causes of memory problems. 1.  First, we will check B12 level because low B12 causes memory problems. 2.  We will check MRI of brain to look for causes of memory problems. 3.  I would like you to undergo neuropsychological testing as well. 4.  Follow up with me after testing to discuss further management. 5.  Please try to quit smoking.

## 2015-06-29 ENCOUNTER — Telehealth: Payer: Self-pay

## 2015-06-29 NOTE — Telephone Encounter (Signed)
Results were left on pt's voicemail, with instructions to call back with any questions or concerns in relation to results.   

## 2015-06-29 NOTE — Telephone Encounter (Signed)
-----   Message from Pieter Partridge, DO sent at 06/28/2015  7:53 PM EDT ----- B12 is normal

## 2015-06-29 NOTE — Telephone Encounter (Signed)
Pt scheduled for 07/06/15 @ 11:15 @ [facility] For imaging.

## 2015-07-05 ENCOUNTER — Other Ambulatory Visit: Payer: Self-pay | Admitting: Endocrinology

## 2015-07-06 ENCOUNTER — Ambulatory Visit
Admission: RE | Admit: 2015-07-06 | Discharge: 2015-07-06 | Disposition: A | Discharge status: Home / Self Care | Payer: Commercial Managed Care - HMO | Source: Ambulatory Visit | Attending: Neurology | Admitting: Neurology

## 2015-07-06 ENCOUNTER — Telehealth: Payer: Self-pay

## 2015-07-06 DIAGNOSIS — R413 Other amnesia: Secondary | ICD-10-CM

## 2015-07-06 DIAGNOSIS — R410 Disorientation, unspecified: Secondary | ICD-10-CM | POA: Diagnosis not present

## 2015-07-06 NOTE — Telephone Encounter (Signed)
Message relayed to patient. Verbalized understanding and denied questions.   

## 2015-07-06 NOTE — Telephone Encounter (Signed)
-----   Message from Pieter Partridge, DO sent at 07/06/2015  3:34 PM EDT ----- Mri of brain looks okay

## 2015-07-12 ENCOUNTER — Ambulatory Visit: Payer: Commercial Managed Care - HMO

## 2015-07-12 DIAGNOSIS — F32A Depression, unspecified: Secondary | ICD-10-CM

## 2015-07-12 DIAGNOSIS — F329 Major depressive disorder, single episode, unspecified: Secondary | ICD-10-CM

## 2015-07-12 NOTE — BH Specialist Note (Signed)
Nicholas Whitehead was in good spirits today, laughing and joking frequently.  He made jokes about having a limo sent to his apartment tomorrow for the 4th of July and how he wants a nice looking chauffeur, etc. He said he has been having a lot of medical appointments and his Epic chart confirms this.  But he reports that he has been feeling better lately and I told him that was very good to hear, since it wasn't that long ago that he was reporting depression.  Plan to meet again in 1 week. Curley Spice, LCSW

## 2015-07-13 ENCOUNTER — Other Ambulatory Visit: Payer: Self-pay | Admitting: Endocrinology

## 2015-07-19 ENCOUNTER — Ambulatory Visit: Payer: Commercial Managed Care - HMO

## 2015-07-20 ENCOUNTER — Encounter: Payer: Commercial Managed Care - HMO | Admitting: Psychology

## 2015-07-21 ENCOUNTER — Ambulatory Visit: Payer: Commercial Managed Care - HMO

## 2015-07-22 ENCOUNTER — Telehealth: Payer: Self-pay | Admitting: Endocrinology

## 2015-07-22 DIAGNOSIS — F411 Generalized anxiety disorder: Secondary | ICD-10-CM

## 2015-07-22 MED ORDER — CLONAZEPAM 1 MG PO TABS: 1.0000 mg | ORAL_TABLET | Freq: Three times a day (TID) | ORAL | Status: DC | PRN

## 2015-07-22 NOTE — Telephone Encounter (Signed)
PT called, he stated that he cannot get in touch with Dr. [name] office, he is almost out of Klonopin and stated that Dr. Dwyane Dee has refilled it before.  He requested I send a message to Dr. Dwyane Dee about this.

## 2015-07-22 NOTE — Telephone Encounter (Signed)
Will forward this to Dr. [name]

## 2015-07-23 NOTE — Telephone Encounter (Signed)
Pt contacted and request to send rx to [facility] at [place].   rx faxed.

## 2015-07-26 ENCOUNTER — Other Ambulatory Visit: Payer: Commercial Managed Care - HMO

## 2015-07-26 DIAGNOSIS — Z794 Long term (current) use of insulin: Secondary | ICD-10-CM | POA: Diagnosis not present

## 2015-07-26 DIAGNOSIS — E1165 Type 2 diabetes mellitus with hyperglycemia: Secondary | ICD-10-CM | POA: Diagnosis not present

## 2015-07-27 LAB — FRUCTOSAMINE: FRUCTOSAMINE: 239 umol/L (ref 0–285)

## 2015-07-29 ENCOUNTER — Encounter: Payer: Self-pay | Admitting: Endocrinology

## 2015-07-29 ENCOUNTER — Ambulatory Visit (INDEPENDENT_AMBULATORY_CARE_PROVIDER_SITE_OTHER): Payer: Commercial Managed Care - HMO | Admitting: Endocrinology

## 2015-07-29 ENCOUNTER — Encounter: Payer: Commercial Managed Care - HMO | Admitting: Psychology

## 2015-07-29 VITALS — BP 126/70 | HR 60 | Ht 74.0 in | Wt 187.0 lb

## 2015-07-29 DIAGNOSIS — E1140 Type 2 diabetes mellitus with diabetic neuropathy, unspecified: Secondary | ICD-10-CM | POA: Diagnosis not present

## 2015-07-29 DIAGNOSIS — Z794 Long term (current) use of insulin: Secondary | ICD-10-CM | POA: Diagnosis not present

## 2015-07-29 NOTE — Progress Notes (Signed)
Patient ID: Nicholas Whitehead, male   DOB: 23-Oct-1955, 60 y.o.   MRN: [id number]   Reason for Appointment: Type II Diabetes follow-up   History of Present Illness   Diagnosis date: 1998    Previous history: He was started on oral agents at diagnosis and around 2006 was put on insulin His blood sugars are usually well controlled when he takes Actos along with his insulin. He had been on Invokana since 04/2013  Recent history:   Insulin regimen: Lantus 55-55 units bid;  Novolog  0-20 AC tid         He is on a regimen of basal bolus insulin with Actos. He has usually been requiring large doses of insulin especially  Lantus which he takes twice a day  His A1c is below 7 usually but in 02/2015 because of poor compliance with insulin and measures of self-care his A1c had gone up to 7.8  In 5/17 A1c  is back to 6.3 Recently fructosamine is excellent at 239  Current management, blood sugar patterns and problems:  His blood sugars are fairly good with A1c recently 6.3 and fructosamine normal  He did not bring his monitor for download  On his last visit he was told to reduce his Lantus to 55 in the evening, with this his sugars are not low normal in the morning  He now thinks that he is taking only Lantus in the morning if blood sugar is below 100 and no NovoLog  Does not remember readings later in the day  He thinks he is trying to be more active now, weight is about the same  Oral hypoglycemic drugs: Actos 30 mg    Side effects from medications:  urgency, frequent urination from Invokana Proper timing of medications in relation to meals: Yes.          Monitors blood glucose:  1-2 times a day  Glucometer:  Accu-Chek  Monitored not available for download today Glucose fasting today was 76         Meals: Breakfast variable, Lunch 2-3pm  usually avoiding high-fat meals, dinner 6 pm        Physical activity: exercise: some walking         Dietician visit: Most recent: 2013     Weight control:  Wt Readings from Last 3 Encounters:  07/29/15 187 lb (84.823 kg)  06/28/15 187 lb (84.823 kg)  05/06/15 187 lb (84.823 kg)          Diabetes labs:  Lab Results  Component Value Date   HGBA1C 6.3 05/31/2015   HGBA1C 7.8* 02/16/2015   HGBA1C 6.4 11/16/2014   Lab Results  Component Value Date   MICROALBUR 13.0* 11/16/2014   LDLCALC 46 03/16/2015   CREATININE 0.86 05/31/2015   Lab on 07/26/2015  Component Date Value Ref Range Status  . Fructosamine 07/26/2015 239  0 - 285 umol/L Final   Comment: Published reference interval for apparently healthy subjects between age 75 and 81 is 60 - 285 umol/L and in a poorly controlled diabetic population is 228 - 563 umol/L with a mean of 396 umol/L.            Medication List       This list is accurate as of: 07/29/15  3:28 PM.  Always use your most recent med list.               ACCU-CHEK FASTCLIX LANCETS Misc  USE  TO CHECK BLOOD SUGAR  THREE TIMES DAILY     ACCU-CHEK SMARTVIEW test strip  Generic drug:  glucose blood  USE AS DIRECTED  TO CHECK BLOOD SUGAR THREE TIMES DAILY     aspirin EC 81 MG tablet  Take 81 mg by mouth every morning.     cetirizine 10 MG tablet  Commonly known as:  ZYRTEC  Take 10 mg by mouth daily.     clonazePAM 1 MG tablet  Commonly known as:  KLONOPIN  Take 1 tablet (1 mg total) by mouth 3 (three) times daily as needed for anxiety.     COD LIVER OIL PO  Take 1 capsule by mouth every morning.     COMPLERA 200-25-300 MG tablet  Generic drug:  emtricitabine-rilpivir-tenofovir DF  Take 1 tablet by mouth daily.     docusate sodium 100 MG capsule  Commonly known as:  COLACE  Take 100 mg by mouth every morning.     gabapentin 300 MG capsule  Commonly known as:  NEURONTIN  take 1 capsule by mouth three times a day     Insulin Pen Needle 31G X 8 MM Misc  Commonly known as:  B-D ULTRAFINE III SHORT PEN  Use to administer insulin Dx E11.9     irbesartan 300 MG tablet   Commonly known as:  AVAPRO  Take 1 tablet (300 mg total) by mouth daily.     LANTUS SOLOSTAR 100 UNIT/ML Solostar Pen  Generic drug:  Insulin Glargine  inject 55 UNITS subcutaneously every morning and 60 UNITS every evening     Melatonin 3 MG Tabs  Take by mouth at bedtime.     multivitamin tablet  Take 1 tablet by mouth daily.     nebivolol 10 MG tablet  Commonly known as:  BYSTOLIC  Take 2 tablets (20 mg total) by mouth daily.     NOVOLOG FLEXPEN 100 UNIT/ML FlexPen  Generic drug:  insulin aspart  inject 20 to 25 units subcutaneously three times a day with meals     pioglitazone 30 MG tablet  Commonly known as:  ACTOS  take 1 tablet by mouth once daily     pravastatin 40 MG tablet  Commonly known as:  PRAVACHOL  take 1 tablet by mouth every evening     sertraline 100 MG tablet  Commonly known as:  ZOLOFT  Take 1 tablet (100 mg total) by mouth daily before breakfast.     sildenafil 20 MG tablet  Commonly known as:  REVATIO  Take 3 as needed     traZODone 100 MG tablet  Commonly known as:  DESYREL  take 2 tablets by mouth at bedtime     triamcinolone cream 0.5 %  Commonly known as:  KENALOG  Apply 1 application topically 3 (three) times daily.     umeclidinium-vilanterol 62.5-25 MCG/INH Aepb  Commonly known as:  ANORO ELLIPTA  Inhale 1 puff into the lungs daily.        Allergies:  Allergies  Allergen Reactions  . Benazepril Cough  . Glipizide Nausea Only    Past Medical History  Diagnosis Date  . HIV infection ([place])   . Hypertension   . Diabetes mellitus without complication ([place])   . Depression   . Anxiety   . Hypercholesterolemia   . Cataract     right   . DM (diabetes mellitus screen)   . HIV disease ([place]) 09/12/1995  . Hyperlipidemia     Past Surgical History  Procedure Laterality Date  . Eye  surgery  2005    catarct surgery  . Optic lens surgery  2005  . Colonoscopy with propofol  01/16/2012    Procedure: COLONOSCOPY WITH PROPOFOL;   Surgeon: Garlan Fair, MD;  Location: [facility];  Service: Endoscopy;  Laterality: N/A;    Family History  Problem Relation Age of Onset  . Arthritis Mother   . Diabetes Mother   . Heart disease Mother   . Stroke Mother   . Arthritis Father   . Diabetes Father   . Heart disease Father     Social History:  reports that he has been smoking Cigarettes.  He has been smoking about 0.25 packs per day. He has never used smokeless tobacco. He reports that he does not drink alcohol or use illicit drugs.  Review of Systems:   Hypertension:  currently on atenolol and on benazepril, not clear why Bystolic has also been prescribed Followed by PCP  Lipids: LDL is controlled with taking pravastatin HDL still low  Lab Results  Component Value Date   CHOL 102 03/16/2015   HDL 38.20* 03/16/2015   LDLCALC 46 03/16/2015   TRIG 89.0 03/16/2015   CHOLHDL 3 03/16/2015    He has symptoms of neuropathy with paresthesia in feet and legs  Recently symptoms are well controlled with 300 mg of gabapentin, 3 daily.   Depression: Not well-controlled, followed by psychiatrist    LABS:  Lab on 07/26/2015  Component Date Value Ref Range Status  . Fructosamine 07/26/2015 239  0 - 285 umol/L Final   Comment: Published reference interval for apparently healthy subjects between age 89 and 74 is 36 - 285 umol/L and in a poorly controlled diabetic population is 228 - 563 umol/L with a mean of 396 umol/L.      Examination:   BP 126/70 mmHg  Pulse 60  Ht 6\' 2"  (1.88 m)  Wt 187 lb (84.823 kg)  BMI 24.00 kg/m2  SpO2 94%  Body mass index is 24 kg/(m^2).     ASSESSMENT/ PLAN:    Diabetes type 2:  See history of present illness for detailed discussion of his current management, blood sugar patterns and problems identified His A1c is higher than usual His blood sugars are seen in fairly high throughout the day especially in the evenings This is likely to be from noncompliance with his  insulin even though he is eating less because of decreased appetite He does not understand the need to take insistent amounts of basal insulin even if he is not eating since he is insulin resistant. He probably needs somewhat less Novolog based on his meal size but not skip it completely also Discussed needing to go back to same dose of LANTUS that he was taking before since even fasting blood sugars are high  Hypertension: Followed by PCP, advised him to follow-up with PCP     Patient Instructions  Take at least 100 Novolog even if sugar<100 for a meal  May take extra Gabapentin for pain      Counseling time on subjects discussed above is over 50% of today's 25 minute visit    Tausha Milhoan 07/29/2015, 3:28 PM             Patient ID: Nicholas Whitehead, male   DOB: May 20, 1955, 60 y.o.   MRN: [id number]   Reason for Appointment: Type II Diabetes follow-up   History of Present Illness   Diagnosis date: 1998    Previous history: He was started on oral agents at diagnosis  and around 2006 was put on insulin His blood sugars are usually well controlled when he takes Actos along with his insulin. He has been on Invokana since 04/2013  Recent history:   Insulin regimen: Lantus 50-55 units bid;  Novolog  20 AC tid         He is on a regimen of basal bolus insulin with Actos. He has usually been requiring large doses of insulin especially  Lantus which he takes twice a day  With this his blood sugars are significantly improved and A1c is below 7 usually  However on this visit his blood sugars are out of control and recently averaging over 200 A1c is relatively higher at 7.8, previously 6.4  Current management, blood sugar patterns and problems:  His blood sugars are higher most of the time and relatively higher in the evenings  He is taking somewhat less Lantus than prescribed previously  Also after some discussion he reveals that he is not taking his Lantus consistently  He  will skip his Lantus especially when he is not having much of an appetite  Despite reduced appetite his weight is not much different  He says he is compliant with his Actos  Taking  consistent doses of Novolog of 20 units at each meal even with differences in his meal size and appetite  No hypoglycemia  Oral hypoglycemic drugs: Actos 30 mg    Side effects from medications:  urgency, frequent urination from Invokana Proper timing of medications in relation to meals: Yes.          Monitors blood glucose:  1-2 times a day  Glucometer:  Accu-Chek  Mean values apply above for all meters except median for One Touch  PRE-MEAL Fasting Lunch Dinner Bedtime Overall  Glucose range:  157-229   124-234   217   200-313    Mean/median:      206            Meals: 3 meals per day. Lunch 2-3pm  usually avoiding high-fat meals, dinner 6 pm        Physical activity: exercise: irregular walking, legs weak          Dietician visit: Most recent: 2013   Weight control:  Wt Readings from Last 3 Encounters:  07/29/15 187 lb (84.823 kg)  06/28/15 187 lb (84.823 kg)  05/06/15 187 lb (84.823 kg)          Diabetes labs:  Lab Results  Component Value Date   HGBA1C 6.3 05/31/2015   HGBA1C 7.8* 02/16/2015   HGBA1C 6.4 11/16/2014   Lab Results  Component Value Date   MICROALBUR 13.0* 11/16/2014   LDLCALC 46 03/16/2015   CREATININE 0.86 05/31/2015   Lab on 07/26/2015  Component Date Value Ref Range Status  . Fructosamine 07/26/2015 239  0 - 285 umol/L Final   Comment: Published reference interval for apparently healthy subjects between age 22 and 44 is 70 - 285 umol/L and in a poorly controlled diabetic population is 228 - 563 umol/L with a mean of 396 umol/L.        Medication List       This list is accurate as of: 07/29/15  3:28 PM.  Always use your most recent med list.               ACCU-CHEK FASTCLIX LANCETS Misc  USE  TO CHECK BLOOD SUGAR THREE TIMES DAILY     ACCU-CHEK  SMARTVIEW test strip  Generic drug:  glucose blood  USE AS DIRECTED  TO CHECK BLOOD SUGAR THREE TIMES DAILY     aspirin EC 81 MG tablet  Take 81 mg by mouth every morning.     cetirizine 10 MG tablet  Commonly known as:  ZYRTEC  Take 10 mg by mouth daily.     clonazePAM 1 MG tablet  Commonly known as:  KLONOPIN  Take 1 tablet (1 mg total) by mouth 3 (three) times daily as needed for anxiety.     COD LIVER OIL PO  Take 1 capsule by mouth every morning.     COMPLERA 200-25-300 MG tablet  Generic drug:  emtricitabine-rilpivir-tenofovir DF  Take 1 tablet by mouth daily.     docusate sodium 100 MG capsule  Commonly known as:  COLACE  Take 100 mg by mouth every morning.     gabapentin 300 MG capsule  Commonly known as:  NEURONTIN  take 1 capsule by mouth three times a day     Insulin Pen Needle 31G X 8 MM Misc  Commonly known as:  B-D ULTRAFINE III SHORT PEN  Use to administer insulin Dx E11.9     irbesartan 300 MG tablet  Commonly known as:  AVAPRO  Take 1 tablet (300 mg total) by mouth daily.     LANTUS SOLOSTAR 100 UNIT/ML Solostar Pen  Generic drug:  Insulin Glargine  inject 55 UNITS subcutaneously every morning and 60 UNITS every evening     Melatonin 3 MG Tabs  Take by mouth at bedtime.     multivitamin tablet  Take 1 tablet by mouth daily.     nebivolol 10 MG tablet  Commonly known as:  BYSTOLIC  Take 2 tablets (20 mg total) by mouth daily.     NOVOLOG FLEXPEN 100 UNIT/ML FlexPen  Generic drug:  insulin aspart  inject 20 to 25 units subcutaneously three times a day with meals     pioglitazone 30 MG tablet  Commonly known as:  ACTOS  take 1 tablet by mouth once daily     pravastatin 40 MG tablet  Commonly known as:  PRAVACHOL  take 1 tablet by mouth every evening     sertraline 100 MG tablet  Commonly known as:  ZOLOFT  Take 1 tablet (100 mg total) by mouth daily before breakfast.     sildenafil 20 MG tablet  Commonly known as:  REVATIO  Take 3 as  needed     traZODone 100 MG tablet  Commonly known as:  DESYREL  take 2 tablets by mouth at bedtime     triamcinolone cream 0.5 %  Commonly known as:  KENALOG  Apply 1 application topically 3 (three) times daily.     umeclidinium-vilanterol 62.5-25 MCG/INH Aepb  Commonly known as:  ANORO ELLIPTA  Inhale 1 puff into the lungs daily.        Allergies:  Allergies  Allergen Reactions  . Benazepril Cough  . Glipizide Nausea Only    Past Medical History  Diagnosis Date  . HIV infection ([place])   . Hypertension   . Diabetes mellitus without complication ([place])   . Depression   . Anxiety   . Hypercholesterolemia   . Cataract     right   . DM (diabetes mellitus screen)   . HIV disease ([place]) 09/12/1995  . Hyperlipidemia     Past Surgical History  Procedure Laterality Date  . Eye surgery  2005    catarct surgery  . Optic lens surgery  2005  .  Colonoscopy with propofol  01/16/2012    Procedure: COLONOSCOPY WITH PROPOFOL;  Surgeon: Garlan Fair, MD;  Location: [facility];  Service: Endoscopy;  Laterality: N/A;    Family History  Problem Relation Age of Onset  . Arthritis Mother   . Diabetes Mother   . Heart disease Mother   . Stroke Mother   . Arthritis Father   . Diabetes Father   . Heart disease Father     Social History:  reports that he has been smoking Cigarettes.  He has been smoking about 0.25 packs per day. He has never used smokeless tobacco. He reports that he does not drink alcohol or use illicit drugs.  Review of Systems:   Hypertension:  currently on atenolol, Bystolic and on benazepril, not clear why Bystolic has also been prescribed Followed by PCP  Lipids: LDL is controlled with taking pravastatin HDL still low  Lab Results  Component Value Date   CHOL 102 03/16/2015   HDL 38.20* 03/16/2015   LDLCALC 46 03/16/2015   TRIG 89.0 03/16/2015   CHOLHDL 3 03/16/2015    He has symptoms of neuropathy with paresthesia in feet and legs  Usually  symptoms are well controlled with 300 mg of gabapentin, 3 daily.Occasionally will have more pain but does not take extra gabapentin for this   Depression:  well-controlled, followed by psychiatrist    LABS:  Lab on 07/26/2015  Component Date Value Ref Range Status  . Fructosamine 07/26/2015 239  0 - 285 umol/L Final   Comment: Published reference interval for apparently healthy subjects between age 44 and 64 is 88 - 285 umol/L and in a poorly controlled diabetic population is 228 - 563 umol/L with a mean of 396 umol/L.      Examination:   BP 126/70 mmHg  Pulse 60  Ht 6\' 2"  (1.88 m)  Wt 187 lb (84.823 kg)  BMI 24.00 kg/m2  SpO2 94%  Body mass index is 24 kg/(m^2).     ASSESSMENT/ PLAN:    Diabetes type 2:  See history of present illness for  discussion of his current management, blood sugar patterns and problems identified His glucose control is good with his watching his diet consistently and taking all his insulin doses as directed A1c was below 6.5 couple of months ago and fructosamine is 239 now He is however not taking his Novolog for fear of hypoglycemia if his blood sugar is below 100 before meals  Discussed need to cover his meals with some NovoLog even if  blood sugars low normal preprandial and he can take at least 10 units based on his meal size regardless of blood sugar  He will bring his monitor on Monday for download  Neuropathy: He can take up to 4 gabapentin a day as needed  Patient Instructions  Take at least 100 Novolog even if sugar<100 for a meal  May take extra Gabapentin for pain         Jamaris Theard 07/29/2015, 3:28 PM

## 2015-07-29 NOTE — Patient Instructions (Signed)
Take at least 100 Novolog even if sugar<100 for a meal  May take extra Gabapentin for pain

## 2015-08-02 ENCOUNTER — Encounter: Payer: Commercial Managed Care - HMO | Admitting: Psychology

## 2015-08-05 ENCOUNTER — Ambulatory Visit: Payer: Commercial Managed Care - HMO

## 2015-08-05 DIAGNOSIS — F331 Major depressive disorder, recurrent, moderate: Secondary | ICD-10-CM

## 2015-08-05 NOTE — BH Specialist Note (Signed)
Karthik let me know as soon as I saw him that he knew my recent tragedy (my wife's death) and wanted to extend his love and support.  He told me that he asked his church to pray for me.  I thanked him for being so kind and concerned and that I appreciate the prayers and the support. We talked about it for a while and I let him know that I am okay and then I was able to shift the focus back to him.  He reports that he is doing pretty well lately and was able to joke around some, which he does when he is in good spirits. He continues to have multiple medical issues but is fairly stable psychiatrically at this time. Plan to meet again in 2 weeks. Curley Spice, LCSW

## 2015-08-11 ENCOUNTER — Ambulatory Visit: Payer: Commercial Managed Care - HMO

## 2015-08-12 ENCOUNTER — Ambulatory Visit (INDEPENDENT_AMBULATORY_CARE_PROVIDER_SITE_OTHER): Payer: Commercial Managed Care - HMO | Admitting: Psychology

## 2015-08-12 ENCOUNTER — Encounter: Payer: Commercial Managed Care - HMO | Admitting: Psychology

## 2015-08-12 DIAGNOSIS — F329 Major depressive disorder, single episode, unspecified: Secondary | ICD-10-CM

## 2015-08-12 DIAGNOSIS — F32A Depression, unspecified: Secondary | ICD-10-CM

## 2015-08-12 DIAGNOSIS — R413 Other amnesia: Secondary | ICD-10-CM | POA: Diagnosis not present

## 2015-08-12 NOTE — Progress Notes (Signed)
NEUROPSYCHOLOGICAL INTERVIEW (CPT: [id number])  Name: Nicholas Whitehead Date of Birth: [birth year] Date of Interview: 08/12/2015  Reason for Referral: Nicholas Whitehead is a 60 y.o., single male who is referred for neuropsychological evaluation by Dr. Metta Clines of [facility] due to concerns about memory loss. This patient was unaccompanied to his appointment today.   History of Presenting Problem:  Nicholas Whitehead reported gradual onset and progressive worsening of short-term memory deficits. Specifically, he reported forgetfulness for recent appointments, medication refills, and other tasks that need to be completed. He has difficulty coordinating appointments and transportation. He will think he has made an appointment or transportation reservation and will be told that he has not. This causes a lot of stress for the patient. Upon direct questioning, he also endorsed difficulty concentrating, starting but not finishing tasks, reduced auditory comprehension.  The patient also endorsed depression which has been present since his parents passed away. He reported being very close with his parents and siblings. He reported increased depression recently due to his sister and her family moving from [place] to [place] this week. He reported that in the past he has become so depressed that he was a "recluse" and avoided social interaction and leaving his home for long periods of time. The patient denied past or present suicidal ideation or intention. He reported that he has been treated with psychopharmacological agents in the past. He reportedly is currently seeing a therapist Curley Spice) which has been very helpful for him.   Current Functioning: Nicholas Whitehead has been on disability since 1998 secondary to HIV, diabetes, and peripheral neuropathy. He lives alone. He no longer drives due to his peripheral neuropathy. He does manage his medications, finances/bills and appointments. As noted previously he does  have difficulty managing these tasks due to cognitive deficits.  Physically, the patient reports that he is tired all the time. He reported difficulty with walking and mobility secondary to peripheral neuropathy. He has had falls due to this.   The patient does monitor his blood sugars regularly (3 times a day). He reported that his A1c has been better lately but not as good as it could be.   Social History: Born/Raised: [employer] Education: One year of college Occupational history: On disability since 1998. 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 Marital history: Single, no children. Alcohol/Tobacco/Substances: The patient reported occasional alcohol use. He denied any history of heavy drinking. The patient currently smokes 1-2 cigarettes a day and is trying to quit altogether. No history of substance abuse or dependence was reported.  Medical History: Past Medical History:  Diagnosis Date  . Anxiety   . Cataract    right   . Depression   . Diabetes mellitus without complication ([place])   . DM (diabetes mellitus screen)   . HIV disease ([place]) 09/12/1995  . HIV infection ([place])   . Hypercholesterolemia   . Hyperlipidemia   . Hypertension     Current Medications:  Outpatient Encounter Prescriptions as of 08/12/2015  Medication Sig  . ACCU-CHEK FASTCLIX LANCETS MISC USE  TO CHECK BLOOD SUGAR THREE TIMES DAILY  . ACCU-CHEK SMARTVIEW test strip USE AS DIRECTED  TO CHECK BLOOD SUGAR THREE TIMES DAILY  . aspirin EC 81 MG tablet Take 81 mg by mouth every morning.  . cetirizine (ZYRTEC) 10 MG tablet Take 10 mg by mouth daily.  . clonazePAM (KLONOPIN) 1 MG tablet Take 1 tablet (1 mg total) by mouth 3 (three) times daily as  needed for anxiety.  . COD LIVER OIL PO Take 1 capsule by mouth every morning.  . COMPLERA 200-25-300 MG tablet Take 1 tablet by mouth daily.  Marland Kitchen docusate sodium (COLACE) 100 MG capsule Take 100 mg by mouth every morning.  .  gabapentin (NEURONTIN) 300 MG capsule take 1 capsule by mouth three times a day  . Insulin Pen Needle (B-D ULTRAFINE III SHORT PEN) 31G X 8 MM MISC Use to administer insulin Dx E11.9  . irbesartan (AVAPRO) 300 MG tablet Take 1 tablet (300 mg total) by mouth daily.  Marland Kitchen LANTUS SOLOSTAR 100 UNIT/ML Solostar Pen inject 55 UNITS subcutaneously every morning and 60 UNITS every evening  . Melatonin 3 MG TABS Take by mouth at bedtime.  . Multiple Vitamin (MULTIVITAMIN) tablet Take 1 tablet by mouth daily.    . nebivolol (BYSTOLIC) 10 MG tablet Take 2 tablets (20 mg total) by mouth daily.  Marland Kitchen NOVOLOG FLEXPEN 100 UNIT/ML FlexPen inject 20 to 25 units subcutaneously three times a day with meals  . pioglitazone (ACTOS) 30 MG tablet take 1 tablet by mouth once daily  . pravastatin (PRAVACHOL) 40 MG tablet take 1 tablet by mouth every evening  . sertraline (ZOLOFT) 100 MG tablet Take 1 tablet (100 mg total) by mouth daily before breakfast.  . sildenafil (REVATIO) 20 MG tablet Take 3 as needed  . traZODone (DESYREL) 100 MG tablet take 2 tablets by mouth at bedtime  . triamcinolone cream (KENALOG) 0.5 % Apply 1 application topically 3 (three) times daily.  Marland Kitchen umeclidinium-vilanterol (ANORO ELLIPTA) 62.5-25 MCG/INH AEPB Inhale 1 puff into the lungs daily.   No facility-administered encounter medications on file as of 08/12/2015.      Behavioral Observations:   Appearance: Neatly and appropriately dressed and groomed Gait: Ambulated with the assistance of a cane Speech: Fluent; normal rate, rhythm; low volume Thought process: Generally linear. Mild circumlocution. Affect: Generally euthymic. Appropriate to situation. Interpersonal: Pleasant, appropriate   TESTING: There is medical necessity to proceed with neuropsychological assessment as the results will be used to aid in differential diagnosis and clinical decision-making and to inform specific treatment recommendations. Per the patient and medical records  reviewed, there has been a change in cognitive functioning and a reasonable suspicion of mild cognitive disorder versus dementia versus pseudodementia.   PLAN: The patient will return for a full battery of neuropsychological testing with a psychometrician under my supervision. Education regarding testing procedures was provided. Subsequently, the patient will see this provider for a follow-up session at which time his test performances and my impressions and treatment recommendations will be reviewed in detail.   Full neuropsychological evaluation report to follow.

## 2015-08-22 ENCOUNTER — Other Ambulatory Visit: Payer: Self-pay | Admitting: Internal Medicine

## 2015-08-22 DIAGNOSIS — L309 Dermatitis, unspecified: Secondary | ICD-10-CM

## 2015-08-23 ENCOUNTER — Ambulatory Visit (INDEPENDENT_AMBULATORY_CARE_PROVIDER_SITE_OTHER): Payer: Commercial Managed Care - HMO | Admitting: Psychology

## 2015-08-23 DIAGNOSIS — R413 Other amnesia: Secondary | ICD-10-CM | POA: Diagnosis not present

## 2015-08-23 NOTE — Progress Notes (Signed)
   Neuropsychology Note  Nicholas Whitehead returned today for 3 hours of neuropsychological testing with technician, Milana Kidney, BS, under the supervision of Dr. Macarthur Critchley. The patient did appear very anxious and sluggish during the testing session, per behavioral observation or via self-report to the technician. Rest breaks were offered. Nicholas Whitehead will return within 2 weeks for a feedback session with Dr. Si Raider at which time his test performances, clinical impressions and treatment recommendations will be reviewed in detail. The patient understands he can contact our office should he require our assistance before this time.  Full report to follow.

## 2015-08-24 ENCOUNTER — Ambulatory Visit: Payer: Commercial Managed Care - HMO

## 2015-08-24 DIAGNOSIS — F331 Major depressive disorder, recurrent, moderate: Secondary | ICD-10-CM

## 2015-08-24 NOTE — BH Specialist Note (Signed)
Nicholas Whitehead was in a good mood today, cracking jokes about how he needs to be in a relationship and how I need to introduce him to someone he saw in the lobby. He talked some about his family and how his grief over his mother's death has evolved over the years. Then he again talked about how he wants to find someone to be with and so I suggested our support group, but he didn't seem too interested. I also mentioned [facility] [employer]'s support groups, where he used to attend, but he rejected this immediately due to the fact that he had a negative experience there with some of the group members. Overall, though, he seems to be doing well, despite some loneliness.  Plan to meet again in one week. Curley Spice, LCSW

## 2015-08-30 NOTE — Progress Notes (Signed)
NEUROPSYCHOLOGICAL EVALUATION   Name:    Nicholas Whitehead  Date of Birth:   Jul 14, 1955 Date of Interview:  08/12/2015 Date of Testing:  08/23/2015  Date of Feedback:  08/31/2015     Background Information:  Reason for Referral:  Nicholas Whitehead is a 60 y.o., single male referred by Dr. Metta Clines to assess his current level of cognitive functioning and assist in differential diagnosis. The current evaluation consisted of a review of available medical records, an interview with the patient, and the completion of a neuropsychological testing battery. Informed consent was obtained.  History of Presenting Problem:  Nicholas Whitehead reported gradual onset and progressive worsening of short-term memory deficits. Specifically, he reported forgetfulness for recent appointments, medication refills, and other tasks that need to be completed. He has difficulty coordinating appointments and transportation. He will think he has made an appointment or transportation reservation and will be told that he has not. This causes a lot of stress for the patient. Upon direct questioning, he also endorsed difficulty concentrating, starting but not finishing tasks, and reduced auditory comprehension.  MRI of the brain on 07/06/2015 was reportedly unremarkable. He scored 22/30 on the MoCA at his neurologic consultation with Dr. Tomi Likens on 06/28/2015.  The patient also endorsed depression which has been present since his parents passed away. He reported being very close with his parents and siblings. He reported increased depression recently due to his sister and her family moving from [place] to [place] this week. He reported that in the past he has become so depressed that he was a "recluse" and avoided social interaction and leaving his home for long periods of time. The patient denied past or present suicidal ideation or intention. He reported that he has been treated with psychopharmacological agents in the past. He  reportedly is currently seeing a therapist Nicholas Whitehead) which has been very helpful for him.   Current Functioning: Nicholas Whitehead has been on disability since 1998 secondary to HIV, diabetes, and peripheral neuropathy. He lives alone. He no longer drives due to his peripheral neuropathy. He does manage his medications, finances/bills and appointments. As noted previously he does have difficulty managing these tasks due to cognitive deficits.  Physically, the patient reports that he is tired all the time. He reported difficulty with walking and mobility secondary to peripheral neuropathy. He has had falls due to this.   The patient does monitor his blood sugars regularly (3 times a day). He reported that his A1c has been better lately but not as good as it could be.   Social History: Born/Raised: [employer] Education: One year of college Occupational history: On disability since 1998. 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 Marital history: Single, no children. Alcohol/Tobacco/Substances: The patient reported occasional alcohol use. He denied any history of heavy drinking. The patient currently smokes 1-2 cigarettes a day and is trying to quit altogether. No history of substance abuse or dependence was reported.   Medical History:  Past Medical History:  Diagnosis Date  . Anxiety   . Cataract    right   . Depression   . Diabetes mellitus without complication ([place])   . DM (diabetes mellitus screen)   . HIV disease ([place]) 09/12/1995  . HIV infection ([place])   . Hypercholesterolemia   . Hyperlipidemia   . Hypertension     Current medications:  Outpatient Encounter Prescriptions as of 08/31/2015  Medication Sig  . ACCU-CHEK FASTCLIX LANCETS MISC USE  TO CHECK BLOOD SUGAR THREE TIMES DAILY  . ACCU-CHEK SMARTVIEW test strip USE AS DIRECTED  TO CHECK BLOOD SUGAR THREE TIMES DAILY  . aspirin EC 81 MG tablet Take 81 mg by mouth every morning.  .  cetirizine (ZYRTEC) 10 MG tablet Take 10 mg by mouth daily.  . clonazePAM (KLONOPIN) 1 MG tablet Take 1 tablet (1 mg total) by mouth 3 (three) times daily as needed for anxiety.  . COD LIVER OIL PO Take 1 capsule by mouth every morning.  . COMPLERA 200-25-300 MG tablet Take 1 tablet by mouth daily.  Marland Kitchen docusate sodium (COLACE) 100 MG capsule Take 100 mg by mouth every morning.  . gabapentin (NEURONTIN) 300 MG capsule take 1 capsule by mouth three times a day  . Insulin Pen Needle (B-D ULTRAFINE III SHORT PEN) 31G X 8 MM MISC Use to administer insulin Dx E11.9  . irbesartan (AVAPRO) 300 MG tablet Take 1 tablet (300 mg total) by mouth daily.  Marland Kitchen LANTUS SOLOSTAR 100 UNIT/ML Solostar Pen inject 55 UNITS subcutaneously every morning and 60 UNITS every evening  . Melatonin 3 MG TABS Take by mouth at bedtime.  . Multiple Vitamin (MULTIVITAMIN) tablet Take 1 tablet by mouth daily.    . nebivolol (BYSTOLIC) 10 MG tablet Take 2 tablets (20 mg total) by mouth daily.  Marland Kitchen NOVOLOG FLEXPEN 100 UNIT/ML FlexPen inject 20 to 25 units subcutaneously three times a day with meals  . pioglitazone (ACTOS) 30 MG tablet take 1 tablet by mouth once daily  . pravastatin (PRAVACHOL) 40 MG tablet take 1 tablet by mouth every evening  . sertraline (ZOLOFT) 100 MG tablet Take 1 tablet (100 mg total) by mouth daily before breakfast.  . sildenafil (REVATIO) 20 MG tablet Take 3 as needed  . traZODone (DESYREL) 100 MG tablet take 2 tablets by mouth at bedtime  . triamcinolone cream (KENALOG) 0.5 % APPLY 1 APPLICATION TOPICALLY THREE TIME A DAY  . umeclidinium-vilanterol (ANORO ELLIPTA) 62.5-25 MCG/INH AEPB Inhale 1 puff into the lungs daily.   No facility-administered encounter medications on file as of 08/31/2015.      Current Examination:  Behavioral Observations:  Appearance: Neatly and appropriately dressed and groomed Gait: Ambulated with the assistance of a cane Speech: Fluent; normal rate, rhythm; low volume Thought  process: Generally linear, some circumlocution. Affect: Generally euthymic and appropriate during interview. Very anxious during testing session, with poor task persistence and refusal to complete some tasks. He also appeared fatigued and sluggish during the testing session. He required a 45 minute break. Interpersonal: Pleasant, appropriate  Tests Administered: . Test of Premorbid Functioning (TOPF) . Wechsler Adult Intelligence Scale-Fourth Edition (WAIS-IV): Similarities, Block Design, Matrix Reasoning, Arithmetic, Symbol Search, Coding and Digit Span subtests . Wisconsin Verbal Learning Test - 2nd Edition (CVLT-2) Standard Form* . Repeatable Battery for the Assessment of Neuropsychological Status (RBANS) Form A:  Story Memory and Recall subtests and Figure Copy and Recall subtests . Neuropsychological Assessment Battery (NAB) Language Module, Form 1: Naming subtest . Controlled Oral Word Association Test (COWAT) . Trail Making Test A and B . Clock drawing test . Generalized Anxiety Disorder - 7 item screener (GAD-7) . Beck Depression Inventory - Second edition (BDI-II) - patient completed only half of the questionnaire  . Beck Anxiety Inventory (BAI)  Test Results: Note: Standardized scores are presented only for use by appropriately trained professionals and to allow for any future test-retest comparison. These scores should not be interpreted without consideration of all the information that  is contained in the rest of the report. The most recent standardization samples from the test publisher or other sources were used whenever possible to derive standard scores; scores were corrected for age, gender, ethnicity and education when available.   *  There was a break from standardized administration on tests with an asterisk, which is discussed in the results section. Test scores are presented for qualitative purposes only and therefore should be interpreted with caution.  Test  Scores:   Test Name Standardized Score Descriptor  TOPF SS= 82 Low average  WAIS-IV Subtests    Similarities ss= 7 Low average  Block Design ss= 5 Borderline  Matrix Reasoning ss= 6 Low average  Arithmetic ss= 4 Impaired  Symbol Search ss= 5 Borderline  Coding ss= 3 Impaired  Digit Span  ss= 7 Low average  RBANS Subtests    Story Memory Z= -2.0 Impaired  Story Recall Z= -1.0 Low average  Figure Copy Refused to complete   Figure Recall Refused to complete   CVLT-II Scores    Trial 1 Z= -2 Impaired  Trials 1-5 total* T= 14 Severely impaired*  LD Free Recall* Z= -3.5 Severely impaired*  LD Cued Recall* Z= -2.5 Impaired*  Recognition Discriminability* (15/16 hits, 3 false positives) Z= 0 Average*  Forced Choice Recognition Raw =16/16 WNL  NAB Naming T= 53 Average  COWAT-FAS T= 32 Borderline  COWAT-Animals T= 37 Low average  Trail Making Test A 1 error T= <20 Severely impaired  Trail Making Test B 3 errors T= <20 Severely impaired  Clock Drawing  WNL   GAD-7 12/21 Severe  BDI-II* 18/63  (Incomplete)*   BAI 28/63 Severe                  Description of Test Results:  Premorbid verbal intellectual abilities were estimated to have been within the low average range based on a test of word reading. Psychomotor processing speed was impaired. Auditory attention and working memory were low average to impaired. Visual-spatial construction was borderline impaired. Language abilities were intact. Specifically, confrontation naming was average, and semantic verbal fluency was low average. With regard to verbal memory, testing of memory of non-contextual information (i.e., word list) was affected by his anxiety. He had great difficulty concentrating on the test, citing tremendous anxiety. He did not complete the test according to standardized administration. Qualitatively, he recalled 3/16 words, 5/16 words, 4/16 words and 3/16 words across four learning trials. He was unable to go  on with the fifth trial and requested a break. He took a 45 minute break. Upon his return 45 minutes later, he was able to freely recall 2/16 words. With semantic cueing, he was able to recall 6/16 words (better than any of his immediate recall trials). He also performed very well on the recognition task (his performance actually fell within the average range despite the increased delay time). Overall, it clearly seemed that anxiety interfered with immediate recall but when provided with cueing and/or recognition format, he was able to perform in the normal range, arguing against consolidation deficits. Similarly, on another verbal memory test, encoding and acquisition of contextual auditory information (i.e., short story) was impaired, but after a delay, free recall was low average. The patient refused to complete a task assessing visual memory. Executive functioning was variable. Mental flexibility and set-shifting were severely impaired on Trails [place]. He required increased time to complete the task, and he committed three set loss errors. Verbal fluency with phonemic search restrictions was borderline  impaired. Verbal abstract reasoning was low average. Non-verbal abstract reasoning was low average. Performance on a clock drawing task was normal. On self-report questionnaires, the patient's responses were  indicative of clinically significant anxiety at the present time. He reported severe (ie daily) worry about many different things, and frequent inability to stop or control his worrying. He reported difficulty relaxing and fearing the worst will happen. He completed only half of a self-report measure of depression (BDI-II), but his responses on the first half of the measure do suggest major depressive disorder, characterized by at the following symptoms: sadness, pessimism, anhedonia, feelings of punishment, self-disappointment, and inability to express his sadness through crying. He denied suicidal ideation or  intention.   Clinical Impressions: Anxiety disorder NOS (rule out generalized anxiety disorder); Major depressive disorder, recurrent. Rule out mild cognitive disorder due HIV and/or vascular cognitive impairment. The patient's cognitive testing revealed relative weaknesses in psychomotor processing speed, auditory attention, visual-spatial construction, initial encoding/acquisition of new information and mental flexibility/set-shifting. Meanwhile, retention and consolidation of new information was intact, as was semantic retrieval and abstract reasoning. These test results are NOT indicative of an underlying Alzheimer's disease, based on the profile of strengths and weaknesses. I cannot rule out fronto-subcortical dysfunction (which would likely be related to diabetes/small vessel disease and/or HIV), but it is important to note that the patient's performance on cognitive testing appeared to be greatly affected by anxiety. Consistent with this, he endorsed severe anxiety on self-report measures, which appears to be generalized anxiety disorder. He is also reporting mild to moderate depression at the present time. I do suspect his anxiety and depression contribute to his cognitive complaints in daily life. I am hopeful that enhanced treatment of his anxiety could result in improved daily functioning.    Recommendations/Plan: The patient was reassured that his current test results are not consistent with Alzheimer's disease, and that I suspect anxiety is the biggest factor contributing to his memory lapses in daily life.  Based on the findings of the present evaluation, the following recommendations are offered:  1. The patient should continue to participate in individual counseling. He reports this is beneficial for him. Addressing specific cognitive-behavioral coping skills for anxiety may be helpful.   2. I spoke with the patient about seeing a psychiatrist as well. At this point, it may be too much  for him to add another provider, so his current healthcare providers can probably manage psychopharmacological treatment of his anxiety best. I see he is prescribed Zoloft 100 mg, and I wonder if an increase in dosage is warranted for him. He is also prescribed Klonopin but given his cognitive complaints, I expressed my concerned about him continuing this medication long-term. Alternative treatment options may be considered by his treating providers.  3. Optimal control of vascular risk factors was highly encouraged, in order to reduce the risk of vascular cognitive impairment. I explained how vascular risk factors such as diabetes can affect memory/thinking over time, especially if not well controlled. 4. Participation in social activities and activities that provide mental stimulation is also recommended in order to improve quality of life and reduce the risk of cognitive decline. 5. Re-evaluation in one year is recommended in order to monitor cognitive status, track any progression of symptoms and further assist with treatment planning.    Feedback to Patient: Nicholas Whitehead returned for a feedback appointment on 08/31/2015 to review the results of his neuropsychological evaluation with this provider. 45 minutes face-to-face time was spent reviewing his test  results, my impressions and my recommendations as detailed above.    Total time spent on this patient's case: [id number] unit for interview with psychologist; [phone] units of testing by psychometrician under psychologist's supervision; [phone] units for medical record review, scoring of neuropsychological tests, interpretation of test results, preparation of this report, and review of results to the patient by psychologist.      Thank you for your referral of Nicholas Whitehead. Please feel free to contact me if you have any questions or concerns regarding this report.

## 2015-08-31 ENCOUNTER — Encounter: Payer: Self-pay | Admitting: Psychology

## 2015-08-31 ENCOUNTER — Ambulatory Visit (INDEPENDENT_AMBULATORY_CARE_PROVIDER_SITE_OTHER): Payer: Commercial Managed Care - HMO | Admitting: Psychology

## 2015-08-31 DIAGNOSIS — R413 Other amnesia: Secondary | ICD-10-CM | POA: Diagnosis not present

## 2015-08-31 DIAGNOSIS — F411 Generalized anxiety disorder: Secondary | ICD-10-CM

## 2015-08-31 DIAGNOSIS — F339 Major depressive disorder, recurrent, unspecified: Secondary | ICD-10-CM

## 2015-09-01 ENCOUNTER — Ambulatory Visit: Payer: Commercial Managed Care - HMO

## 2015-09-01 ENCOUNTER — Ambulatory Visit: Payer: Commercial Managed Care - HMO | Admitting: Neurology

## 2015-09-01 DIAGNOSIS — F331 Major depressive disorder, recurrent, moderate: Secondary | ICD-10-CM

## 2015-09-01 NOTE — BH Specialist Note (Signed)
Nicholas Whitehead reported that he has been having anxiety lately.  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  So I encouraged him to practice this when he is calm, so that he can do it better when anxiety comes on. I also suggested he get up and move around and I showed him some very basic tai chi exercises that are simple enough for him, despite his balance issues. I also talked to him about mindfulness and agreed to elaborate more next session.  Plan to meet in 2 weeks. Curley Spice, LCSW

## 2015-09-03 ENCOUNTER — Other Ambulatory Visit: Payer: Self-pay | Admitting: Endocrinology

## 2015-09-06 ENCOUNTER — Ambulatory Visit (INDEPENDENT_AMBULATORY_CARE_PROVIDER_SITE_OTHER): Payer: Commercial Managed Care - HMO | Admitting: Internal Medicine

## 2015-09-06 ENCOUNTER — Encounter: Payer: Self-pay | Admitting: Internal Medicine

## 2015-09-06 ENCOUNTER — Other Ambulatory Visit (INDEPENDENT_AMBULATORY_CARE_PROVIDER_SITE_OTHER): Payer: Commercial Managed Care - HMO

## 2015-09-06 VITALS — BP 148/82 | HR 60 | Temp 98.5°F | Ht 74.0 in | Wt 186.0 lb

## 2015-09-06 DIAGNOSIS — E1140 Type 2 diabetes mellitus with diabetic neuropathy, unspecified: Secondary | ICD-10-CM | POA: Diagnosis not present

## 2015-09-06 DIAGNOSIS — F418 Other specified anxiety disorders: Secondary | ICD-10-CM | POA: Diagnosis not present

## 2015-09-06 DIAGNOSIS — Z794 Long term (current) use of insulin: Secondary | ICD-10-CM

## 2015-09-06 DIAGNOSIS — Z23 Encounter for immunization: Secondary | ICD-10-CM | POA: Diagnosis not present

## 2015-09-06 DIAGNOSIS — N522 Drug-induced erectile dysfunction: Secondary | ICD-10-CM | POA: Insufficient documentation

## 2015-09-06 DIAGNOSIS — I10 Essential (primary) hypertension: Secondary | ICD-10-CM

## 2015-09-06 DIAGNOSIS — F419 Anxiety disorder, unspecified: Secondary | ICD-10-CM

## 2015-09-06 DIAGNOSIS — F411 Generalized anxiety disorder: Secondary | ICD-10-CM

## 2015-09-06 DIAGNOSIS — F329 Major depressive disorder, single episode, unspecified: Secondary | ICD-10-CM

## 2015-09-06 LAB — CBC WITH DIFFERENTIAL/PLATELET
BASOS PCT: 0.2 % (ref 0.0–3.0)
Basophils Absolute: 0 10*3/uL (ref 0.0–0.1)
EOS ABS: 0.1 10*3/uL (ref 0.0–0.7)
Eosinophils Relative: 1 % (ref 0.0–5.0)
HEMATOCRIT: 45.1 % (ref 39.0–52.0)
Hemoglobin: 15.4 g/dL (ref 13.0–17.0)
Lymphocytes Relative: 22.4 % (ref 12.0–46.0)
Lymphs Abs: 1.8 10*3/uL (ref 0.7–4.0)
MCHC: 34.2 g/dL (ref 30.0–36.0)
MCV: 96.6 fl (ref 78.0–100.0)
MONO ABS: 0.6 10*3/uL (ref 0.1–1.0)
Monocytes Relative: 7.2 % (ref 3.0–12.0)
NEUTROS ABS: 5.5 10*3/uL (ref 1.4–7.7)
Neutrophils Relative %: 69.2 % (ref 43.0–77.0)
PLATELETS: 143 10*3/uL — AB (ref 150.0–400.0)
RBC: 4.67 Mil/uL (ref 4.22–5.81)
RDW: 15.1 % (ref 11.5–15.5)
WBC: 7.9 10*3/uL (ref 4.0–10.5)

## 2015-09-06 LAB — BASIC METABOLIC PANEL
BUN: 12 mg/dL (ref 6–23)
CALCIUM: 8.6 mg/dL (ref 8.4–10.5)
CO2: 30 mEq/L (ref 19–32)
Chloride: 106 mEq/L (ref 96–112)
Creatinine, Ser: 0.91 mg/dL (ref 0.40–1.50)
GFR: 110.11 mL/min (ref >60.00)
Glucose, Bld: 130 mg/dL — ABNORMAL HIGH (ref 70–99)
POTASSIUM: 4.2 meq/L (ref 3.5–5.1)
SODIUM: 139 meq/L (ref 135–145)

## 2015-09-06 LAB — HEMOGLOBIN A1C: HEMOGLOBIN A1C: 6.2 % (ref 4.6–6.5)

## 2015-09-06 MED ORDER — CLONAZEPAM 1 MG PO TABS: 1.0000 mg | ORAL_TABLET | Freq: Two times a day (BID) | ORAL | 5 refills | Status: DC | PRN

## 2015-09-06 MED ORDER — SILDENAFIL CITRATE 100 MG PO TABS: 100.0000 mg | ORAL_TABLET | Freq: Every day | ORAL | 11 refills | Status: DC | PRN

## 2015-09-06 MED ORDER — TRAZODONE HCL 300 MG PO TABS: 300.0000 mg | ORAL_TABLET | Freq: Every day | ORAL | 3 refills | Status: DC

## 2015-09-06 NOTE — Progress Notes (Signed)
Subjective:  Patient ID: Nicholas Whitehead, male    DOB: 1955/07/11  Age: 60 y.o. MRN: [id number]  CC: Diabetes and Hypertension   HPI [name] presents for follow up-since I last saw him he has seen neurology and neuropsychology for evaluation of memory loss. It is felt by them that the benzodiazepines may be affecting his memory. He is willing to start a taper of the Klonopin but he does complain of ongoing symptoms with anxiety and insomnia. He is currently taking trazodone 100 mg at bedtime and says it helps some but not much.  Outpatient Medications Prior to Visit  Medication Sig Dispense Refill  . ACCU-CHEK FASTCLIX LANCETS MISC USE  TO CHECK BLOOD SUGAR THREE TIMES DAILY 306 each 1  . ACCU-CHEK SMARTVIEW test strip USE AS DIRECTED  TO CHECK BLOOD SUGAR THREE TIMES DAILY 300 each 1  . aspirin EC 81 MG tablet Take 81 mg by mouth every morning.    . cetirizine (ZYRTEC) 10 MG tablet Take 10 mg by mouth daily.    . COMPLERA 200-25-300 MG tablet Take 1 tablet by mouth daily. 30 tablet 5  . docusate sodium (COLACE) 100 MG capsule Take 100 mg by mouth every morning.    . gabapentin (NEURONTIN) 300 MG capsule take 1 capsule by mouth three times a day 90 capsule 3  . Insulin Pen Needle (B-D ULTRAFINE III SHORT PEN) 31G X 8 MM MISC Use to administer insulin Dx E11.9 300 each 3  . irbesartan (AVAPRO) 300 MG tablet Take 1 tablet (300 mg total) by mouth daily. 90 tablet 3  . LANTUS SOLOSTAR 100 UNIT/ML Solostar Pen inject 55 UNITS subcutaneously every morning and 60 UNITS every evening 30 mL 3  . Melatonin 3 MG TABS Take by mouth at bedtime.    . nebivolol (BYSTOLIC) 10 MG tablet Take 2 tablets (20 mg total) by mouth daily. 60 tablet 11  . NOVOLOG FLEXPEN 100 UNIT/ML FlexPen inject 20 to 25 units subcutaneously three times a day with meals 30 mL 2  . pioglitazone (ACTOS) 30 MG tablet take 1 tablet by mouth once daily 30 tablet 3  . pravastatin (PRAVACHOL) 40 MG tablet take 1 tablet by mouth  every evening 30 tablet 4  . sertraline (ZOLOFT) 100 MG tablet Take 1 tablet (100 mg total) by mouth daily before breakfast. 30 tablet 11  . triamcinolone cream (KENALOG) 0.5 % APPLY 1 APPLICATION TOPICALLY THREE TIME A DAY 100 g 1  . umeclidinium-vilanterol (ANORO ELLIPTA) 62.5-25 MCG/INH AEPB Inhale 1 puff into the lungs daily. 30 each 11  . clonazePAM (KLONOPIN) 1 MG tablet Take 1 tablet (1 mg total) by mouth 3 (three) times daily as needed for anxiety. 90 tablet 5  . COD LIVER OIL PO Take 1 capsule by mouth every morning.    . Multiple Vitamin (MULTIVITAMIN) tablet Take 1 tablet by mouth daily.      . sildenafil (REVATIO) 20 MG tablet Take 3 as needed 20 tablet 1  . traZODone (DESYREL) 100 MG tablet take 2 tablets by mouth at bedtime 60 tablet 11   No facility-administered medications prior to visit.     ROS Review of Systems  Constitutional: Negative for activity change, appetite change, chills, fatigue and fever.  HENT: Negative.  Negative for sore throat and trouble swallowing.   Eyes: Negative.  Negative for visual disturbance.  Respiratory: Negative for cough, choking, chest tightness, shortness of breath and stridor.   Cardiovascular: Negative.  Negative for chest pain, palpitations  and leg swelling.  Gastrointestinal: Negative.  Negative for abdominal pain, constipation, diarrhea, nausea and vomiting.  Endocrine: Negative.   Genitourinary: Negative.  Negative for decreased urine volume, difficulty urinating, dysuria, flank pain, frequency, hematuria and urgency.       He complains of erectile dysfunction, he has tried generic Viagra without much improvement, he wants me to write him a prescription for the real Viagra.  Musculoskeletal: Negative for back pain, myalgias and neck pain.  Skin: Negative.  Negative for color change and rash.  Allergic/Immunologic: Negative.   Neurological: Negative.  Negative for dizziness, weakness and light-headedness.  Psychiatric/Behavioral:  Positive for sleep disturbance. Negative for agitation, behavioral problems, confusion, decreased concentration, dysphoric mood, hallucinations and self-injury. The patient is nervous/anxious. The patient is not hyperactive.     Objective:  BP (!) 148/82 (BP Location: Left Arm, Patient Position: Sitting, Cuff Size: Normal)   Pulse 60   Temp 98.5 F (36.9 C) (Oral)   Ht 6\' 2"  (1.88 m)   Wt 186 lb (84.4 kg)   SpO2 95%   BMI 23.88 kg/m   BP Readings from Last 3 Encounters:  09/06/15 (!) 148/82  07/29/15 126/70  06/28/15 136/74    Wt Readings from Last 3 Encounters:  09/06/15 186 lb (84.4 kg)  07/29/15 187 lb (84.8 kg)  06/28/15 187 lb (84.8 kg)    Physical Exam  Constitutional: He is oriented to person, place, and time. No distress.  HENT:  Mouth/Throat: Oropharynx is clear and moist. No oropharyngeal exudate.  Eyes: Conjunctivae are normal. Right eye exhibits no discharge. Left eye exhibits no discharge. No scleral icterus.  Neck: Normal range of motion. Neck supple. No JVD present. No tracheal deviation present. No thyromegaly present.  Cardiovascular: Normal rate, regular rhythm, normal heart sounds and intact distal pulses.  Exam reveals no gallop and no friction rub.   No murmur heard. Pulmonary/Chest: Effort normal and breath sounds normal. No stridor. No respiratory distress. He has no wheezes. He has no rales. He exhibits no tenderness.  Abdominal: Soft. Bowel sounds are normal. He exhibits no distension and no mass. There is no tenderness. There is no rebound and no guarding.  Musculoskeletal: Normal range of motion. He exhibits no edema, tenderness or deformity.  Lymphadenopathy:    He has no cervical adenopathy.  Neurological: He is oriented to person, place, and time.  Skin: Skin is warm and dry. No rash noted. He is not diaphoretic. No erythema. No pallor.  Psychiatric: He has a normal mood and affect. His behavior is normal. Judgment and thought content normal.    Vitals reviewed.   Lab Results  Component Value Date   WBC 7.9 09/06/2015   HGB 15.4 09/06/2015   HCT 45.1 09/06/2015   PLT 143.0 (L) 09/06/2015   GLUCOSE 130 (H) 09/06/2015   CHOL 102 03/16/2015   TRIG 89.0 03/16/2015   HDL 38.20 (L) 03/16/2015   LDLCALC 46 03/16/2015   ALT 15 05/31/2015   AST 22 05/31/2015   NA 139 09/06/2015   K 4.2 09/06/2015   CL 106 09/06/2015   CREATININE 0.91 09/06/2015   BUN 12 09/06/2015   CO2 30 09/06/2015   TSH 0.93 03/25/2015   PSA 0.82 03/25/2015   HGBA1C 6.2 09/06/2015   MICROALBUR 13.0 (H) 11/16/2014    Mr Brain Wo Contrast  Result Date: 07/06/2015 CLINICAL DATA:  Memory loss, confusion, and depression. EXAM: MRI HEAD WITHOUT CONTRAST TECHNIQUE: Multiplanar, multiecho pulse sequences of the brain and surrounding structures were obtained  without intravenous contrast. COMPARISON:  09/18/2006 FINDINGS: There is no evidence of acute infarct, intracranial hemorrhage, mass, midline shift, or extra-axial fluid collection. Ventricles and sulci are unchanged and within normal limits. The brain is normal in signal. Prior right cataract extraction is noted. A small right maxillary sinus mucous retention cyst and mild left maxillary sinus mucosal thickening are noted. The mastoid air cells are clear. Major intracranial vascular flow voids are preserved. Bone marrow signal is unremarkable. IMPRESSION: Unremarkable appearance of the brain. Electronically Signed   By: Logan Bores M.D.   On: 07/06/2015 13:22    Assessment & Plan:   Joyner was seen today for diabetes and hypertension.  Diagnoses and all orders for this visit:  Need for prophylactic vaccination and inoculation against influenza -     Flu vaccine HIGH DOSE PF (Fluzone High dose)  Essential hypertension- his blood pressure is adequately well controlled, electrolytes and renal function are stable. -     CBC with Differential/Platelet; Future -     Basic metabolic panel; Future  Type 2  diabetes mellitus with diabetic neuropathy, with long-term current use of insulin ([place])- his blood sugars are well-controlled -     Ambulatory referral to Ophthalmology -     Basic metabolic panel; Future -     Hemoglobin A1c; Future  Anxiety and depression- will decrease the Klonopin from 3 times a day to twice a day in hopes that we can eventually taper off of this and will start increasing the trazodone dose, up to 300 mg and maybe eventually up to 600 mg at bedtime. -     trazodone (DESYREL) 300 MG tablet; Take 1 tablet (300 mg total) by mouth at bedtime. -     clonazePAM (KLONOPIN) 1 MG tablet; Take 1 tablet (1 mg total) by mouth 2 (two) times daily as needed for anxiety.  Drug-induced erectile dysfunction -     sildenafil (VIAGRA) 100 MG tablet; Take 1 tablet (100 mg total) by mouth daily as needed for erectile dysfunction.  GAD (generalized anxiety disorder) -     clonazePAM (KLONOPIN) 1 MG tablet; Take 1 tablet (1 mg total) by mouth 2 (two) times daily as needed for anxiety.  Need for prophylactic vaccination against Streptococcus pneumoniae (pneumococcus) -     Pneumococcal polysaccharide vaccine 23-valent greater than or equal to 2yo subcutaneous/IM   I have discontinued Mr. Tumlinson multivitamin, COD LIVER OIL PO, traZODone, and sildenafil. I have also changed his clonazePAM. Additionally, I am having him start on trazodone and sildenafil. Lastly, I am having him maintain his cetirizine, aspirin EC, docusate sodium, Melatonin, nebivolol, pravastatin, sertraline, irbesartan, COMPLERA, ACCU-CHEK FASTCLIX LANCETS, ACCU-CHEK SMARTVIEW, umeclidinium-vilanterol, Insulin Pen Needle, NOVOLOG FLEXPEN, gabapentin, LANTUS SOLOSTAR, triamcinolone cream, and pioglitazone.  Meds ordered this encounter  Medications  . trazodone (DESYREL) 300 MG tablet    Sig: Take 1 tablet (300 mg total) by mouth at bedtime.    Dispense:  90 tablet    Refill:  3  . sildenafil (VIAGRA) 100 MG tablet    Sig:  Take 1 tablet (100 mg total) by mouth daily as needed for erectile dysfunction.    Dispense:  12 tablet    Refill:  11  . clonazePAM (KLONOPIN) 1 MG tablet    Sig: Take 1 tablet (1 mg total) by mouth 2 (two) times daily as needed for anxiety.    Dispense:  60 tablet    Refill:  5     Follow-up: Return in about 6 months (  around 03/08/2016).  Scarlette Calico, MD

## 2015-09-06 NOTE — Patient Instructions (Signed)
Hypertension Hypertension, commonly called high blood pressure, is when the force of blood pumping through your arteries is too strong. Your arteries are the blood vessels that carry blood from your heart throughout your body. A blood pressure reading consists of a higher number over a lower number, such as 110/72. The higher number (systolic) is the pressure inside your arteries when your heart pumps. The lower number (diastolic) is the pressure inside your arteries when your heart relaxes. Ideally you want your blood pressure below 120/80. Hypertension forces your heart to work harder to pump blood. Your arteries may become narrow or stiff. Having untreated or uncontrolled hypertension can cause heart attack, stroke, kidney disease, and other problems. RISK FACTORS Some risk factors for high blood pressure are controllable. Others are not.  Risk factors you cannot control include:   Race. You may be at higher risk if you are African American.  Age. Risk increases with age.  Gender. Men are at higher risk than women before age 45 years. After age 65, women are at higher risk than men. Risk factors you can control include:  Not getting enough exercise or physical activity.  Being overweight.  Getting too much fat, sugar, calories, or salt in your diet.  Drinking too much alcohol. SIGNS AND SYMPTOMS Hypertension does not usually cause signs or symptoms. Extremely high blood pressure (hypertensive crisis) may cause headache, anxiety, shortness of breath, and nosebleed. DIAGNOSIS To check if you have hypertension, your health care provider will measure your blood pressure while you are seated, with your arm held at the level of your heart. It should be measured at least twice using the same arm. Certain conditions can cause a difference in blood pressure between your right and left arms. A blood pressure reading that is higher than normal on one occasion does not mean that you need treatment. If  it is not clear whether you have high blood pressure, you may be asked to return on a different day to have your blood pressure checked again. Or, you may be asked to monitor your blood pressure at home for 1 or more weeks. TREATMENT Treating high blood pressure includes making lifestyle changes and possibly taking medicine. Living a healthy lifestyle can help lower high blood pressure. You may need to change some of your habits. Lifestyle changes may include:  Following the DASH diet. This diet is high in fruits, vegetables, and whole grains. It is low in salt, red meat, and added sugars.  Keep your sodium intake below 2,300 mg per day.  Getting at least 30-45 minutes of aerobic exercise at least 4 times per week.  Losing weight if necessary.  Not smoking.  Limiting alcoholic beverages.  Learning ways to reduce stress. Your health care provider may prescribe medicine if lifestyle changes are not enough to get your blood pressure under control, and if one of the following is true:  You are 18-59 years of age and your systolic blood pressure is above 140.  You are 60 years of age or older, and your systolic blood pressure is above 150.  Your diastolic blood pressure is above 90.  You have diabetes, and your systolic blood pressure is over 140 or your diastolic blood pressure is over 90.  You have kidney disease and your blood pressure is above 140/90.  You have heart disease and your blood pressure is above 140/90. Your personal target blood pressure may vary depending on your medical conditions, your age, and other factors. HOME CARE INSTRUCTIONS    Have your blood pressure rechecked as directed by your health care provider.   Take medicines only as directed by your health care provider. Follow the directions carefully. Blood pressure medicines must be taken as prescribed. The medicine does not work as well when you skip doses. Skipping doses also puts you at risk for  problems.  Do not smoke.   Monitor your blood pressure at home as directed by your health care provider. SEEK MEDICAL CARE IF:   You think you are having a reaction to medicines taken.  You have recurrent headaches or feel dizzy.  You have swelling in your ankles.  You have trouble with your vision. SEEK IMMEDIATE MEDICAL CARE IF:  You develop a severe headache or confusion.  You have unusual weakness, numbness, or feel faint.  You have severe chest or abdominal pain.  You vomit repeatedly.  You have trouble breathing. MAKE SURE YOU:   Understand these instructions.  Will watch your condition.  Will get help right away if you are not doing well or get worse.   This information is not intended to replace advice given to you by your health care provider. Make sure you discuss any questions you have with your health care provider.   Document Released: 12/26/2004 Document Revised: 05/12/2014 Document Reviewed: 10/18/2012 Elsevier Interactive Patient Education 2016 [employer]  

## 2015-09-06 NOTE — Progress Notes (Signed)
Pre visit review using our clinic review tool, if applicable. No additional management support is needed unless otherwise documented below in the visit note. 

## 2015-09-13 ENCOUNTER — Other Ambulatory Visit: Payer: Self-pay | Admitting: Endocrinology

## 2015-09-15 ENCOUNTER — Ambulatory Visit: Payer: Commercial Managed Care - HMO

## 2015-09-15 DIAGNOSIS — F331 Major depressive disorder, recurrent, moderate: Secondary | ICD-10-CM

## 2015-09-15 NOTE — BH Specialist Note (Signed)
Jeydon was in a pleasant mood today, joking around as he usually does.  He did talk about some neighbors that he recently got angry with and "cussed out". He was doing favors for them - dog sitting, etc., but they began expecting him to do things for them and never compensated him.  They also would show up hours later than they said they would be back.  He let them know, he said, that that was the end of it. I validated for him that he did the right thing by setting limits with them. Otherwise, his mood seems to be good at this time. Plan to meet again in 2 weeks. Curley Spice, LCSW

## 2015-09-30 ENCOUNTER — Ambulatory Visit: Payer: Commercial Managed Care - HMO

## 2015-09-30 DIAGNOSIS — F331 Major depressive disorder, recurrent, moderate: Secondary | ICD-10-CM

## 2015-09-30 NOTE — BH Specialist Note (Signed)
Nicholas Whitehead reports that he has been sleeping a lot lately and so I asked if he thought he was depressed.  He said yes. He said he feels like he is no longer attractive to other men, partly due to his age.  I told him that many people like older men and that I have clients older than him who have an active dating life. I encouraged him to attend our support group here but he said he was usually too tired by the time Friday came. I also talked to him about using social media to connect with people, but he currently doesn't have internet access. Plan to meet again in 2 weeks. Curley Spice, LCSW

## 2015-10-13 DIAGNOSIS — Z961 Presence of intraocular lens: Secondary | ICD-10-CM | POA: Diagnosis not present

## 2015-10-13 DIAGNOSIS — H524 Presbyopia: Secondary | ICD-10-CM | POA: Diagnosis not present

## 2015-10-13 LAB — HM DIABETES EYE EXAM

## 2015-10-14 ENCOUNTER — Ambulatory Visit: Payer: Commercial Managed Care - HMO

## 2015-10-14 DIAGNOSIS — F331 Major depressive disorder, recurrent, moderate: Secondary | ICD-10-CM

## 2015-10-14 NOTE — BH Specialist Note (Signed)
Nicholas Whitehead was jovial today, but then reported that at times when he is at home he gets depressed. I talked with him about increasing his socialization and he did admit that when he is not going to doctor's appointments, he is at home all the time.  He used to attend [facility] [employer] support group but he had a bad experience and now won't go back. 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  Plan to meet again in 2 weeks. Curley Spice, LCSW

## 2015-10-15 ENCOUNTER — Other Ambulatory Visit: Payer: Self-pay | Admitting: Endocrinology

## 2015-10-15 DIAGNOSIS — Z794 Long term (current) use of insulin: Principal | ICD-10-CM

## 2015-10-15 DIAGNOSIS — E0840 Diabetes mellitus due to underlying condition with diabetic neuropathy, unspecified: Secondary | ICD-10-CM

## 2015-10-18 ENCOUNTER — Encounter: Payer: Self-pay | Admitting: Internal Medicine

## 2015-10-18 NOTE — Progress Notes (Unsigned)
Report faxed from [facility] for pt eye exam that showed no diabetic retinopathy.

## 2015-10-25 ENCOUNTER — Other Ambulatory Visit (INDEPENDENT_AMBULATORY_CARE_PROVIDER_SITE_OTHER): Payer: Commercial Managed Care - HMO

## 2015-10-25 DIAGNOSIS — E1140 Type 2 diabetes mellitus with diabetic neuropathy, unspecified: Secondary | ICD-10-CM

## 2015-10-25 DIAGNOSIS — Z794 Long term (current) use of insulin: Secondary | ICD-10-CM

## 2015-10-25 DIAGNOSIS — E0840 Diabetes mellitus due to underlying condition with diabetic neuropathy, unspecified: Secondary | ICD-10-CM

## 2015-10-25 LAB — COMPREHENSIVE METABOLIC PANEL
ALK PHOS: 47 U/L (ref 39–117)
ALT: 18 U/L (ref 0–53)
AST: 21 U/L (ref 0–37)
Albumin: 4.3 g/dL (ref 3.5–5.2)
BILIRUBIN TOTAL: 0.3 mg/dL (ref 0.2–1.2)
BUN: 9 mg/dL (ref 6–23)
CO2: 32 meq/L (ref 19–32)
Calcium: 9.2 mg/dL (ref 8.4–10.5)
Chloride: 104 mEq/L (ref 96–112)
Creatinine, Ser: 0.91 mg/dL (ref 0.40–1.50)
GFR: 110.06 mL/min (ref >60.00)
GLUCOSE: 146 mg/dL — AB (ref 70–99)
POTASSIUM: 3.8 meq/L (ref 3.5–5.1)
SODIUM: 142 meq/L (ref 135–145)
TOTAL PROTEIN: 7.9 g/dL (ref 6.0–8.3)

## 2015-10-26 LAB — FRUCTOSAMINE: Fructosamine: 271 umol/L (ref 0–285)

## 2015-10-28 ENCOUNTER — Ambulatory Visit: Payer: Commercial Managed Care - HMO | Admitting: Endocrinology

## 2015-10-28 ENCOUNTER — Encounter: Payer: Self-pay | Admitting: Endocrinology

## 2015-10-28 ENCOUNTER — Ambulatory Visit (INDEPENDENT_AMBULATORY_CARE_PROVIDER_SITE_OTHER): Payer: Commercial Managed Care - HMO | Admitting: Endocrinology

## 2015-10-28 ENCOUNTER — Ambulatory Visit: Payer: Commercial Managed Care - HMO

## 2015-10-28 VITALS — BP 122/82 | HR 56 | Temp 98.1°F | Resp 16 | Ht 74.0 in | Wt 194.8 lb

## 2015-10-28 DIAGNOSIS — Z794 Long term (current) use of insulin: Secondary | ICD-10-CM | POA: Diagnosis not present

## 2015-10-28 DIAGNOSIS — F331 Major depressive disorder, recurrent, moderate: Secondary | ICD-10-CM

## 2015-10-28 DIAGNOSIS — E1165 Type 2 diabetes mellitus with hyperglycemia: Secondary | ICD-10-CM | POA: Diagnosis not present

## 2015-10-28 LAB — MICROALBUMIN / CREATININE URINE RATIO
CREATININE, U: 100 mg/dL
Microalb Creat Ratio: 8.7 mg/g (ref 0.0–30.0)
Microalb, Ur: 8.7 mg/dL — ABNORMAL HIGH (ref 0.0–1.9)

## 2015-10-28 NOTE — BH Specialist Note (Signed)
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  He said he recently cussed out a male who used to ask him to look after her newborn, but was not taking good care of her despite her being premature and having health issues.  Nicholas Whitehead continues also to talk about his desire for romantic companionship but he has limited transportation and also has so many health concerns.  He was able to laugh some today.  Plan to meet again in 2 weeks. Curley Spice, LCSW

## 2015-10-28 NOTE — Patient Instructions (Signed)
Check blood sugars on waking up  3x weekly  Also check blood sugars about 2 hours after a meal and do this after different meals by rotation  Recommended blood sugar levels on waking up is 90-130 and about 2 hours after meal is 130-160  Please bring your blood sugar monitor to each visit, thank you  

## 2015-10-28 NOTE — Progress Notes (Signed)
Patient ID: Nicholas Whitehead, male   DOB: 29-Dec-1955, 60 y.o.   MRN: [id number]   Reason for Appointment: Type II Diabetes follow-up   History of Present Illness   Diagnosis date: 1998    Previous history: He was started on oral agents at diagnosis and around 2006 was put on insulin His blood sugars are usually well controlled when he takes Actos along with his insulin. He had been on Invokana since 04/2013  Recent history:   Insulin regimen: Lantus 55-55 units bid;  Novolog  20 AC tid         He is on a regimen of basal bolus insulin with Actos. He has usually been requiring large doses of insulin especially  Lantus which he takes twice a day  His A1c is below 7 usually but in 02/2015 because of poor compliance with insulin and measures of self-care his A1c had gone up to 7.8  In 8/17 A1c A1c was 6.2 and has been consistent now Recently fructosamine is slightly higher at 271, previously 239 However he had a meter with nonfunctioning battery and could not be downloaded  Current management, blood sugar patterns and problems:  His blood sugars are reportedly fairly good at home but does not remember the readings  He does not think his sugars are much higher after meals but today after eating and without insulin sugar was 252 fairly good   He was told on his last visit not to skip insulin either Lantus or Novolog when blood sugars are normal and he thinks he is trying to do this.  Sometimes will take 25 NovoLog for larger meals are high sugars  He thinks he is trying to be more active now, weight is going up  Oral hypoglycemic drugs: Actos 30 mg    Side effects from medications:  urgency, frequent urination from Invokana Proper timing of medications in relation to meals: Yes.          Monitors blood glucose:  1-2 times a day  Glucometer:  Accu-Chek  Monitore not available for download today Glucose as above          Meals: Breakfast variable, leftovevers usually.  Lunch  2-3pm  usually avoiding high-fat meals, dinner 6 pm        Physical activity: exercise: some walking         Dietician visit: Most recent: 2013   Weight control:  Wt Readings from Last 3 Encounters:  10/28/15 194 lb 12.8 oz (88.4 kg)  09/06/15 186 lb (84.4 kg)  07/29/15 187 lb (84.8 kg)          Diabetes labs:  Lab Results  Component Value Date   HGBA1C 6.2 09/06/2015   HGBA1C 6.3 05/31/2015   HGBA1C 7.8 (H) 02/16/2015   Lab Results  Component Value Date   MICROALBUR 13.0 (H) 11/16/2014   LDLCALC 46 03/16/2015   CREATININE 0.91 10/25/2015   Lab on 10/25/2015  Component Date Value Ref Range Status  . Sodium 10/25/2015 142  135 - 145 mEq/L Final  . Potassium 10/25/2015 3.8  3.5 - 5.1 mEq/L Final  . Chloride 10/25/2015 104  96 - 112 mEq/L Final  . CO2 10/25/2015 32  19 - 32 mEq/L Final  . Glucose, Bld 10/25/2015 146* 70 - 99 mg/dL Final  . BUN 10/25/2015 9  6 - 23 mg/dL Final  . Creatinine, Ser 10/25/2015 0.91  0.40 - 1.50 mg/dL Final  . Total Bilirubin 10/25/2015 0.3  0.2 -  1.2 mg/dL Final  . Alkaline Phosphatase 10/25/2015 47  39 - 117 U/L Final  . AST 10/25/2015 21  0 - 37 U/L Final  . ALT 10/25/2015 18  0 - 53 U/L Final  . Total Protein 10/25/2015 7.9  6.0 - 8.3 g/dL Final  . Albumin 10/25/2015 4.3  3.5 - 5.2 g/dL Final  . Calcium 10/25/2015 9.2  8.4 - 10.5 mg/dL Final  . GFR 10/25/2015 110.06  >60.00 mL/min Final  . Fructosamine 10/26/2015 271  0 - 285 umol/L Final   Comment: Published reference interval for apparently healthy subjects between age 67 and 84 is 35 - 285 umol/L and in a poorly controlled diabetic population is 228 - 563 umol/L with a mean of 396 umol/L.        Medication List       Accurate as of 10/28/15  3:28 PM. Always use your most recent med list.          ACCU-CHEK FASTCLIX LANCETS Misc USE  TO CHECK BLOOD SUGAR THREE TIMES DAILY   ACCU-CHEK SMARTVIEW test strip Generic drug:  glucose blood USE AS DIRECTED  TO CHECK BLOOD  SUGAR THREE TIMES DAILY   aspirin EC 81 MG tablet Take 81 mg by mouth every morning.   cetirizine 10 MG tablet Commonly known as:  ZYRTEC Take 10 mg by mouth daily.   clonazePAM 1 MG tablet Commonly known as:  KLONOPIN Take 1 tablet (1 mg total) by mouth 2 (two) times daily as needed for anxiety.   COMPLERA 200-25-300 MG tablet Generic drug:  emtricitabine-rilpivir-tenofovir DF Take 1 tablet by mouth daily.   docusate sodium 100 MG capsule Commonly known as:  COLACE Take 100 mg by mouth every morning.   gabapentin 300 MG capsule Commonly known as:  NEURONTIN take 1 capsule by mouth three times a day   Insulin Pen Needle 31G X 8 MM Misc Commonly known as:  B-D ULTRAFINE III SHORT PEN Use to administer insulin Dx E11.9   irbesartan 300 MG tablet Commonly known as:  AVAPRO Take 1 tablet (300 mg total) by mouth daily.   LANTUS SOLOSTAR 100 UNIT/ML Solostar Pen Generic drug:  Insulin Glargine inject 55 UNITS subcutaneously every morning and 60 UNITS every evening   Melatonin 3 MG Tabs Take by mouth at bedtime.   nebivolol 10 MG tablet Commonly known as:  BYSTOLIC Take 2 tablets (20 mg total) by mouth daily.   NOVOLOG FLEXPEN 100 UNIT/ML FlexPen Generic drug:  insulin aspart inject 20 to 25 units subcutaneously three times a day with meals   pioglitazone 30 MG tablet Commonly known as:  ACTOS take 1 tablet by mouth once daily   pravastatin 40 MG tablet Commonly known as:  PRAVACHOL take 1 tablet by mouth every evening   sertraline 100 MG tablet Commonly known as:  ZOLOFT Take 1 tablet (100 mg total) by mouth daily before breakfast.   sildenafil 100 MG tablet Commonly known as:  VIAGRA Take 1 tablet (100 mg total) by mouth daily as needed for erectile dysfunction.   trazodone 300 MG tablet Commonly known as:  DESYREL Take 1 tablet (300 mg total) by mouth at bedtime.   triamcinolone cream 0.5 % Commonly known as:  KENALOG APPLY 1 APPLICATION TOPICALLY  THREE TIME A DAY   umeclidinium-vilanterol 62.5-25 MCG/INH Aepb Commonly known as:  ANORO ELLIPTA Inhale 1 puff into the lungs daily.       Allergies:  Allergies  Allergen Reactions  . Benazepril Cough  .  Glipizide Nausea Only    Past Medical History:  Diagnosis Date  . Anxiety   . Cataract    right   . Depression   . Diabetes mellitus without complication ([place])   . DM (diabetes mellitus screen)   . HIV disease ([place]) 09/12/1995  . HIV infection ([place])   . Hypercholesterolemia   . Hyperlipidemia   . Hypertension     Past Surgical History:  Procedure Laterality Date  . COLONOSCOPY WITH PROPOFOL  01/16/2012   Procedure: COLONOSCOPY WITH PROPOFOL;  Surgeon: Garlan Fair, MD;  Location: [facility];  Service: Endoscopy;  Laterality: N/A;  . EYE SURGERY  2005   catarct surgery  . optic lens surgery  2005    Family History  Problem Relation Age of Onset  . Arthritis Mother   . Diabetes Mother   . Heart disease Mother   . Stroke Mother   . Arthritis Father   . Diabetes Father   . Heart disease Father     Social History:  reports that he has been smoking Cigarettes.  He has been smoking about 0.25 packs per day. He has never used smokeless tobacco. He reports that he does not drink alcohol or use drugs.  Review of Systems:   Hypertension:  currently on atenolol and on benazepril, not clear why Bystolic has also been prescribed Followed by PCP  Lipids: LDL is controlled with taking pravastatin HDL still low  Lab Results  Component Value Date   CHOL 102 03/16/2015   HDL 38.20 (L) 03/16/2015   LDLCALC 46 03/16/2015   TRIG 89.0 03/16/2015   CHOLHDL 3 03/16/2015    He has symptoms of neuropathy with paresthesia in feet and legs  Recently symptoms are well controlled with 300 mg of gabapentin, 3 daily.   Depression: Not well-controlled, followed by psychiatrist    LABS:  Lab on 10/25/2015  Component Date Value Ref Range Status  . Sodium 10/25/2015 142   135 - 145 mEq/L Final  . Potassium 10/25/2015 3.8  3.5 - 5.1 mEq/L Final  . Chloride 10/25/2015 104  96 - 112 mEq/L Final  . CO2 10/25/2015 32  19 - 32 mEq/L Final  . Glucose, Bld 10/25/2015 146* 70 - 99 mg/dL Final  . BUN 10/25/2015 9  6 - 23 mg/dL Final  . Creatinine, Ser 10/25/2015 0.91  0.40 - 1.50 mg/dL Final  . Total Bilirubin 10/25/2015 0.3  0.2 - 1.2 mg/dL Final  . Alkaline Phosphatase 10/25/2015 47  39 - 117 U/L Final  . AST 10/25/2015 21  0 - 37 U/L Final  . ALT 10/25/2015 18  0 - 53 U/L Final  . Total Protein 10/25/2015 7.9  6.0 - 8.3 g/dL Final  . Albumin 10/25/2015 4.3  3.5 - 5.2 g/dL Final  . Calcium 10/25/2015 9.2  8.4 - 10.5 mg/dL Final  . GFR 10/25/2015 110.06  >60.00 mL/min Final  . Fructosamine 10/26/2015 271  0 - 285 umol/L Final   Comment: Published reference interval for apparently healthy subjects between age 47 and 47 is 47 - 285 umol/L and in a poorly controlled diabetic population is 228 - 563 umol/L with a mean of 396 umol/L.      Examination:   BP 122/82   Pulse (!) 56   Temp 98.1 F (36.7 C)   Resp 16   Ht 6\' 2"  (1.88 m)   Wt 194 lb 12.8 oz (88.4 kg)   SpO2 95%   BMI 25.01 kg/m  Body mass index is 25.01 kg/m.     ASSESSMENT/ PLAN:    Diabetes type 2:  See history of present illness for detailed discussion of his current management, blood sugar patterns and problems identified His A1c is higher than usual His blood sugars are seen in fairly high throughout the day especially in the evenings This is likely to be from noncompliance with his insulin even though he is eating less because of decreased appetite He does not understand the need to take insistent amounts of basal insulin even if he is not eating since he is insulin resistant.  He probably needs somewhat less Novolog based on his meal size but not skip it completely also Discussed needing to go back to same dose of LANTUS that he was taking before since even fasting blood sugars  are high  Hypertension: Followed by PCP, advised him to follow-up with PCP     Patient Instructions  Check blood sugars on waking up  3x weekly  Also check blood sugars about 2 hours after a meal and do this after different meals by rotation  Recommended blood sugar levels on waking up is 90-130 and about 2 hours after meal is 130-160  Please bring your blood sugar monitor to each visit, thank you    Counseling time on subjects discussed above is over 50% of today's 25 minute visit    [place] 10/28/2015, 3:28 PM             Patient ID: Nicholas Whitehead, male   DOB: [birth year], 60 y.o.   MRN: [id number]   Reason for Appointment: Type II Diabetes follow-up   History of Present Illness   Diagnosis date: 1998    Previous history: He was started on oral agents at diagnosis and around 2006 was put on insulin His blood sugars are usually well controlled when he takes Actos along with his insulin. He has been on Invokana since 04/2013  Recent history:   Insulin regimen: Lantus 50-55 units bid;  Novolog  20 AC tid         He is on a regimen of basal bolus insulin with Actos. He has usually been requiring large doses of insulin especially  Lantus which he takes twice a day  With this his blood sugars are significantly improved and A1c is below 7 usually  However on this visit his blood sugars are out of control and recently averaging over 200 A1c is relatively higher at 7.8, previously 6.4  Current management, blood sugar patterns and problems:  His blood sugars are higher most of the time and relatively higher in the evenings  He is taking somewhat less Lantus than prescribed previously  Also after some discussion he reveals that he is not taking his Lantus consistently  He will skip his Lantus especially when he is not having much of an appetite  Despite reduced appetite his weight is not much different  He says he is compliant with his Actos  Taking   consistent doses of Novolog of 20 units at each meal even with differences in his meal size and appetite  No hypoglycemia  Oral hypoglycemic drugs: Actos 30 mg    Side effects from medications:  urgency, frequent urination from Invokana Proper timing of medications in relation to meals: Yes.          Monitors blood glucose:  1-2 times a day  Glucometer:  Accu-Chek  Mean values apply above for all meters except median for One Touch  PRE-MEAL Fasting Lunch Dinner Bedtime Overall  Glucose range:  157-229   124-234   217   200-313    Mean/median:      206            Meals: 3 meals per day. Lunch 2-3pm  usually avoiding high-fat meals, dinner 6 pm        Physical activity: exercise: irregular walking, legs weak          Dietician visit: Most recent: 2013   Weight control:  Wt Readings from Last 3 Encounters:  10/28/15 194 lb 12.8 oz (88.4 kg)  09/06/15 186 lb (84.4 kg)  07/29/15 187 lb (84.8 kg)          Diabetes labs:  Lab Results  Component Value Date   HGBA1C 6.2 09/06/2015   HGBA1C 6.3 05/31/2015   HGBA1C 7.8 (H) 02/16/2015   Lab Results  Component Value Date   MICROALBUR 13.0 (H) 11/16/2014   LDLCALC 46 03/16/2015   CREATININE 0.91 10/25/2015   Lab on 10/25/2015  Component Date Value Ref Range Status  . Sodium 10/25/2015 142  135 - 145 mEq/L Final  . Potassium 10/25/2015 3.8  3.5 - 5.1 mEq/L Final  . Chloride 10/25/2015 104  96 - 112 mEq/L Final  . CO2 10/25/2015 32  19 - 32 mEq/L Final  . Glucose, Bld 10/25/2015 146* 70 - 99 mg/dL Final  . BUN 10/25/2015 9  6 - 23 mg/dL Final  . Creatinine, Ser 10/25/2015 0.91  0.40 - 1.50 mg/dL Final  . Total Bilirubin 10/25/2015 0.3  0.2 - 1.2 mg/dL Final  . Alkaline Phosphatase 10/25/2015 47  39 - 117 U/L Final  . AST 10/25/2015 21  0 - 37 U/L Final  . ALT 10/25/2015 18  0 - 53 U/L Final  . Total Protein 10/25/2015 7.9  6.0 - 8.3 g/dL Final  . Albumin 10/25/2015 4.3  3.5 - 5.2 g/dL Final  . Calcium 10/25/2015 9.2  8.4 -  10.5 mg/dL Final  . GFR 10/25/2015 110.06  >60.00 mL/min Final  . Fructosamine 10/26/2015 271  0 - 285 umol/L Final   Comment: Published reference interval for apparently healthy subjects between age 43 and 64 is 6 - 285 umol/L and in a poorly controlled diabetic population is 228 - 563 umol/L with a mean of 396 umol/L.        Medication List       Accurate as of 10/28/15  3:28 PM. Always use your most recent med list.          ACCU-CHEK FASTCLIX LANCETS Misc USE  TO CHECK BLOOD SUGAR THREE TIMES DAILY   ACCU-CHEK SMARTVIEW test strip Generic drug:  glucose blood USE AS DIRECTED  TO CHECK BLOOD SUGAR THREE TIMES DAILY   aspirin EC 81 MG tablet Take 81 mg by mouth every morning.   cetirizine 10 MG tablet Commonly known as:  ZYRTEC Take 10 mg by mouth daily.   clonazePAM 1 MG tablet Commonly known as:  KLONOPIN Take 1 tablet (1 mg total) by mouth 2 (two) times daily as needed for anxiety.   COMPLERA 200-25-300 MG tablet Generic drug:  emtricitabine-rilpivir-tenofovir DF Take 1 tablet by mouth daily.   docusate sodium 100 MG capsule Commonly known as:  COLACE Take 100 mg by mouth every morning.   gabapentin 300 MG capsule Commonly known as:  NEURONTIN take 1 capsule by mouth three times a day   Insulin Pen Needle 31G X 8 MM Misc Commonly  known as:  B-D ULTRAFINE III SHORT PEN Use to administer insulin Dx E11.9   irbesartan 300 MG tablet Commonly known as:  AVAPRO Take 1 tablet (300 mg total) by mouth daily.   LANTUS SOLOSTAR 100 UNIT/ML Solostar Pen Generic drug:  Insulin Glargine inject 55 UNITS subcutaneously every morning and 60 UNITS every evening   Melatonin 3 MG Tabs Take by mouth at bedtime.   nebivolol 10 MG tablet Commonly known as:  BYSTOLIC Take 2 tablets (20 mg total) by mouth daily.   NOVOLOG FLEXPEN 100 UNIT/ML FlexPen Generic drug:  insulin aspart inject 20 to 25 units subcutaneously three times a day with meals   pioglitazone 30  MG tablet Commonly known as:  ACTOS take 1 tablet by mouth once daily   pravastatin 40 MG tablet Commonly known as:  PRAVACHOL take 1 tablet by mouth every evening   sertraline 100 MG tablet Commonly known as:  ZOLOFT Take 1 tablet (100 mg total) by mouth daily before breakfast.   sildenafil 100 MG tablet Commonly known as:  VIAGRA Take 1 tablet (100 mg total) by mouth daily as needed for erectile dysfunction.   trazodone 300 MG tablet Commonly known as:  DESYREL Take 1 tablet (300 mg total) by mouth at bedtime.   triamcinolone cream 0.5 % Commonly known as:  KENALOG APPLY 1 APPLICATION TOPICALLY THREE TIME A DAY   umeclidinium-vilanterol 62.5-25 MCG/INH Aepb Commonly known as:  ANORO ELLIPTA Inhale 1 puff into the lungs daily.       Allergies:  Allergies  Allergen Reactions  . Benazepril Cough  . Glipizide Nausea Only    Past Medical History:  Diagnosis Date  . Anxiety   . Cataract    right   . Depression   . Diabetes mellitus without complication ([place])   . DM (diabetes mellitus screen)   . HIV disease ([place]) 09/12/1995  . HIV infection ([place])   . Hypercholesterolemia   . Hyperlipidemia   . Hypertension     Past Surgical History:  Procedure Laterality Date  . COLONOSCOPY WITH PROPOFOL  01/16/2012   Procedure: COLONOSCOPY WITH PROPOFOL;  Surgeon: Garlan Fair, MD;  Location: [facility];  Service: Endoscopy;  Laterality: N/A;  . EYE SURGERY  2005   catarct surgery  . optic lens surgery  2005    Family History  Problem Relation Age of Onset  . Arthritis Mother   . Diabetes Mother   . Heart disease Mother   . Stroke Mother   . Arthritis Father   . Diabetes Father   . Heart disease Father     Social History:  reports that he has been smoking Cigarettes.  He has been smoking about 0.25 packs per day. He has never used smokeless tobacco. He reports that he does not drink alcohol or use drugs.  Review of Systems:   Hypertension:  currently on  Bystolic and on Avapro now Followed by PCP  Lipids: LDL is controlled with taking pravastatin HDL still low  Lab Results  Component Value Date   CHOL 102 03/16/2015   HDL 38.20 (L) 03/16/2015   LDLCALC 46 03/16/2015   TRIG 89.0 03/16/2015   CHOLHDL 3 03/16/2015    He has symptoms of neuropathy with paresthesia in feet and legs  Usually symptoms are well controlled with 300 mg of gabapentin, 3 daily  Depression:  well-controlled, followed by psychiatrist    LABS:  Lab on 10/25/2015  Component Date Value Ref Range Status  . Sodium  10/25/2015 142  135 - 145 mEq/L Final  . Potassium 10/25/2015 3.8  3.5 - 5.1 mEq/L Final  . Chloride 10/25/2015 104  96 - 112 mEq/L Final  . CO2 10/25/2015 32  19 - 32 mEq/L Final  . Glucose, Bld 10/25/2015 146* 70 - 99 mg/dL Final  . BUN 10/25/2015 9  6 - 23 mg/dL Final  . Creatinine, Ser 10/25/2015 0.91  0.40 - 1.50 mg/dL Final  . Total Bilirubin 10/25/2015 0.3  0.2 - 1.2 mg/dL Final  . Alkaline Phosphatase 10/25/2015 47  39 - 117 U/L Final  . AST 10/25/2015 21  0 - 37 U/L Final  . ALT 10/25/2015 18  0 - 53 U/L Final  . Total Protein 10/25/2015 7.9  6.0 - 8.3 g/dL Final  . Albumin 10/25/2015 4.3  3.5 - 5.2 g/dL Final  . Calcium 10/25/2015 9.2  8.4 - 10.5 mg/dL Final  . GFR 10/25/2015 110.06  >60.00 mL/min Final  . Fructosamine 10/26/2015 271  0 - 285 umol/L Final   Comment: Published reference interval for apparently healthy subjects between age 18 and 15 is 54 - 285 umol/L and in a poorly controlled diabetic population is 228 - 563 umol/L with a mean of 396 umol/L.      Examination:   BP 122/82   Pulse (!) 56   Temp 98.1 F (36.7 C)   Resp 16   Ht 6\' 2"  (1.88 m)   Wt 194 lb 12.8 oz (88.4 kg)   SpO2 95%   BMI 25.01 kg/m   Body mass index is 25.01 kg/m.     ASSESSMENT/ PLAN:    Diabetes type 2:  See history of present illness for  discussion of his current management, blood sugar patterns and problems identified Unable to  download his meter and not clear what his blood sugar patterns are  His glucose control is good with fructosamine upper normal although has been better than before He will need to get a new battery and bring his monitor for download for further review Meanwhile continue same doses of insulin Discussed consistent diet and offered consultation with dietitian  Patient Instructions  Check blood sugars on waking up  3x weekly  Also check blood sugars about 2 hours after a meal and do this after different meals by rotation  Recommended blood sugar levels on waking up is 90-130 and about 2 hours after meal is 130-160  Please bring your blood sugar monitor to each visit, thank you       [facility] 10/28/2015, 3:28 PM

## 2015-10-28 NOTE — Addendum Note (Signed)
Addended by: Elayne Snare on: 10/28/2015 03:30 PM   Modules accepted: Orders

## 2015-10-31 ENCOUNTER — Other Ambulatory Visit: Payer: Self-pay | Admitting: Endocrinology

## 2015-11-03 ENCOUNTER — Other Ambulatory Visit: Payer: Self-pay | Admitting: Endocrinology

## 2015-11-05 ENCOUNTER — Telehealth: Payer: Self-pay | Admitting: Emergency Medicine

## 2015-11-05 DIAGNOSIS — E118 Type 2 diabetes mellitus with unspecified complications: Secondary | ICD-10-CM

## 2015-11-05 NOTE — Telephone Encounter (Signed)
Pt called and wants to know if he can get a referral to see a podiatrist. He states his feet are out of control. Hes diabetic, cant cut his toenails and has a lot of callous. He would like to see Dr Mikey College. [place] [place]. The address is [address] , [place] [address] Please advise thanks.

## 2015-11-05 NOTE — Telephone Encounter (Signed)
Referral sent 

## 2015-11-05 NOTE — Telephone Encounter (Signed)
Referral entered. Please schedule with the provider pt requested in phone note, if possible.  Forwarding to PCP as an [place]

## 2015-11-11 ENCOUNTER — Ambulatory Visit: Payer: Commercial Managed Care - HMO

## 2015-11-11 DIAGNOSIS — F331 Major depressive disorder, recurrent, moderate: Secondary | ICD-10-CM

## 2015-11-11 NOTE — BH Specialist Note (Signed)
Nicholas Whitehead was in a good mood today, joking around some.  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 He also has a male friend who cooked a dinner for him and had him stay over for a few days to relax, which he enjoyed.  Overall, he seems to be doing well.  Plan to meet in 2 weeks. Nicholas Spice, LCSW

## 2015-11-18 DIAGNOSIS — M216X2 Other acquired deformities of left foot: Secondary | ICD-10-CM | POA: Diagnosis not present

## 2015-11-18 DIAGNOSIS — L602 Onychogryphosis: Secondary | ICD-10-CM | POA: Diagnosis not present

## 2015-11-18 DIAGNOSIS — M216X1 Other acquired deformities of right foot: Secondary | ICD-10-CM | POA: Diagnosis not present

## 2015-11-18 DIAGNOSIS — E1151 Type 2 diabetes mellitus with diabetic peripheral angiopathy without gangrene: Secondary | ICD-10-CM | POA: Diagnosis not present

## 2015-11-18 DIAGNOSIS — L84 Corns and callosities: Secondary | ICD-10-CM | POA: Diagnosis not present

## 2015-11-25 ENCOUNTER — Ambulatory Visit: Payer: Commercial Managed Care - HMO

## 2015-11-25 DIAGNOSIS — F331 Major depressive disorder, recurrent, moderate: Secondary | ICD-10-CM

## 2015-11-25 NOTE — BH Specialist Note (Signed)
Nicholas Whitehead talked about how he has felt rather "blah" lately. He is frustrated that he has no one in his life and even if he did, his ED would prevent him from having a sex life. We discussed his medications and I sent his PCP, Dr. [name], a note about possibly switching his Zoloft to Wellbutrin to see if this helped. He said he plans to stay at home on Thanksgiving, because he just doesn't want to go and be around family right now. Plan to meet again in 2 weeks. Curley Spice, LCSW

## 2015-11-29 ENCOUNTER — Encounter: Payer: Self-pay | Admitting: Internal Medicine

## 2015-12-09 ENCOUNTER — Ambulatory Visit: Payer: Commercial Managed Care - HMO

## 2015-12-13 ENCOUNTER — Other Ambulatory Visit: Payer: Self-pay | Admitting: Endocrinology

## 2015-12-16 ENCOUNTER — Ambulatory Visit: Payer: Commercial Managed Care - HMO

## 2015-12-23 ENCOUNTER — Encounter: Payer: Self-pay | Admitting: Internal Medicine

## 2015-12-23 ENCOUNTER — Ambulatory Visit (INDEPENDENT_AMBULATORY_CARE_PROVIDER_SITE_OTHER): Payer: Commercial Managed Care - HMO | Admitting: Internal Medicine

## 2015-12-23 VITALS — BP 160/80 | HR 60 | Temp 97.8°F | Ht 73.0 in | Wt 205.1 lb

## 2015-12-23 DIAGNOSIS — E1140 Type 2 diabetes mellitus with diabetic neuropathy, unspecified: Secondary | ICD-10-CM

## 2015-12-23 DIAGNOSIS — F329 Major depressive disorder, single episode, unspecified: Secondary | ICD-10-CM

## 2015-12-23 DIAGNOSIS — F418 Other specified anxiety disorders: Secondary | ICD-10-CM | POA: Diagnosis not present

## 2015-12-23 DIAGNOSIS — Z794 Long term (current) use of insulin: Secondary | ICD-10-CM

## 2015-12-23 DIAGNOSIS — Z5181 Encounter for therapeutic drug level monitoring: Secondary | ICD-10-CM | POA: Diagnosis not present

## 2015-12-23 DIAGNOSIS — F32A Depression, unspecified: Secondary | ICD-10-CM

## 2015-12-23 DIAGNOSIS — F419 Anxiety disorder, unspecified: Secondary | ICD-10-CM

## 2015-12-23 DIAGNOSIS — B20 Human immunodeficiency virus [HIV] disease: Secondary | ICD-10-CM

## 2015-12-23 LAB — GLUCOSE, CAPILLARY: Glucose-Capillary: 330 mg/dL — ABNORMAL HIGH (ref 65–99)

## 2015-12-23 NOTE — Assessment & Plan Note (Signed)
Doing well on Complera.  I will check next visit to get him on [place].  rtc 6 months

## 2015-12-23 NOTE — Assessment & Plan Note (Signed)
Finger stick glucose 330 today.

## 2015-12-23 NOTE — Assessment & Plan Note (Signed)
He continues to follow our counselor.  Will discuss new SSRI with his PCP

## 2015-12-23 NOTE — Assessment & Plan Note (Signed)
Creat has been stable but will change to [facility] next visit.

## 2015-12-23 NOTE — Progress Notes (Signed)
CC: Follow up for HIV  Interval history: Currently is asymptomatic and well-controlled on Complera.  He was supposed to change to [facility] last year but does not appear he got it.  He has followed with our counselor here and continues to suffer with depression. He has been on zoloft but it was suggested to him to consider Wellbutrin.  He also has diabetes and is trying to control it.  Some memory issues and has seen neurology, had an unremarkable MRI.   Prior to Admission medications   Medication Sig Start Date End Date Taking? Authorizing Provider  ACCU-CHEK FASTCLIX LANCETS MISC USE  TO CHECK BLOOD SUGAR THREE TIMES DAILY 03/31/15  Yes Elayne Snare, MD  ACCU-CHEK SMARTVIEW test strip USE AS DIRECTED  TO CHECK BLOOD SUGAR THREE TIMES DAILY 03/31/15  Yes Elayne Snare, MD  aspirin EC 81 MG tablet Take 81 mg by mouth every morning.   Yes Historical Provider, MD  cetirizine (ZYRTEC) 10 MG tablet Take 10 mg by mouth daily.   Yes Historical Provider, MD  clonazePAM (KLONOPIN) 1 MG tablet Take 1 tablet (1 mg total) by mouth 2 (two) times daily as needed for anxiety. 09/06/15  Yes [name], MD  COMPLERA 200-25-300 MG tablet Take 1 tablet by mouth daily. 03/29/15  Yes [name], MD  docusate sodium (COLACE) 100 MG capsule Take 100 mg by mouth every morning.   Yes Historical Provider, MD  gabapentin (NEURONTIN) 300 MG capsule take 1 capsule by mouth three times a day 11/03/15  Yes Elayne Snare, MD  Insulin Pen Needle (B-D ULTRAFINE III SHORT PEN) 31G X 8 MM MISC Use to administer insulin Dx E11.9 05/19/15  Yes [name], MD  irbesartan (AVAPRO) 300 MG tablet Take 1 tablet (300 mg total) by mouth daily. 03/25/15  Yes [name], MD  LANTUS SOLOSTAR 100 UNIT/ML Solostar Pen INJECT 55 UNITS SUBCUTANEOUSLY EVERY MORNING AND INJECT 60 UNITS EVERY EVENING 10/31/15  Yes Elayne Snare, MD  nebivolol (BYSTOLIC) 10 MG tablet Take 2 tablets (20 mg total) by mouth daily. 12/14/14  Yes [name], MD  NOVOLOG  FLEXPEN 100 UNIT/ML FlexPen inject 20 to 25 units subcutaneously three times a day with meals 12/13/15  Yes Elayne Snare, MD  pioglitazone (ACTOS) 30 MG tablet take 1 tablet by mouth once daily 09/03/15  Yes Elayne Snare, MD  pravastatin (PRAVACHOL) 40 MG tablet take 1 tablet by mouth every evening 10/15/15  Yes Elayne Snare, MD  sertraline (ZOLOFT) 100 MG tablet Take 1 tablet (100 mg total) by mouth daily before breakfast. 01/29/15  Yes [name], MD  trazodone (DESYREL) 300 MG tablet Take 1 tablet (300 mg total) by mouth at bedtime. 09/06/15  Yes [name], MD  triamcinolone cream (KENALOG) 0.5 % APPLY 1 APPLICATION TOPICALLY THREE TIME A DAY 08/23/15  Yes [name], MD  umeclidinium-vilanterol [facility] ELLIPTA) 62.5-25 MCG/INH AEPB Inhale 1 puff into the lungs daily. 05/06/15  Yes [name], MD  Melatonin 3 MG TABS Take by mouth at bedtime.    Historical Provider, MD  sildenafil (VIAGRA) 100 MG tablet Take 1 tablet (100 mg total) by mouth daily as needed for erectile dysfunction. Patient not taking: Reported on 12/23/2015 09/06/15   [name], MD    Review of Systems Constitutional: negative for anorexia and weight loss Gastrointestinal: negative for diarrhea Integument/breast: negative for rash Behavioral/Psych: positive for depression, negative for SI All other systems reviewed and are negative  Physical Exam: CONSTITUTIONAL:in no apparent distress and alert  Vitals:   12/23/15 1049  BP: (!) 160/80  Pulse: 60  Temp: 97.8 F (36.6 C)   Eyes: anicteric HENT: no thrush, no cervical lymphadenopathy Respiratory: Normal respiratory effort; CTA B GI: soft, nt  Lab Results  Component Value Date   HIV1RNAQUANT <20 09/22/2014   HIV1RNAQUANT <20 04/29/2014   HIV1RNAQUANT <20 12/11/2013   SH: continues to smoke

## 2015-12-24 LAB — T-HELPER CELL (CD4) - (RCID CLINIC ONLY)
CD4 % Helper T Cell: 35 % (ref 33–55)
CD4 T Cell Abs: 780 /uL (ref 400–2700)

## 2015-12-27 ENCOUNTER — Telehealth: Payer: Self-pay | Admitting: Endocrinology

## 2015-12-27 LAB — HIV-1 RNA QUANT-NO REFLEX-BLD: HIV-1 RNA Quant, Log: <1.3 Log copies/mL (ref <1.30)

## 2015-12-27 NOTE — Telephone Encounter (Signed)
Patient ask you to give him a call, asap

## 2015-12-28 NOTE — Telephone Encounter (Signed)
Called but patient mail box was full I will try again later

## 2015-12-30 ENCOUNTER — Ambulatory Visit: Payer: Commercial Managed Care - HMO

## 2015-12-30 DIAGNOSIS — F331 Major depressive disorder, recurrent, moderate: Secondary | ICD-10-CM

## 2015-12-30 NOTE — BH Specialist Note (Signed)
Nicholas Whitehead seemed to be in fairly good spirits today and reported that he plans to spend Christmas with his brother here in [place]. He talked about a male neighbor he has befriended and enjoyed spending time with her recently. He drank 2 glasses of water during the session, as he often does. Plan to meet again in 3 weeks. Curley Spice, LCSW

## 2016-01-02 ENCOUNTER — Other Ambulatory Visit: Payer: Self-pay | Admitting: Endocrinology

## 2016-01-08 ENCOUNTER — Other Ambulatory Visit: Payer: Self-pay | Admitting: Internal Medicine

## 2016-01-08 DIAGNOSIS — I10 Essential (primary) hypertension: Secondary | ICD-10-CM

## 2016-01-16 ENCOUNTER — Other Ambulatory Visit: Payer: Self-pay | Admitting: Internal Medicine

## 2016-01-20 ENCOUNTER — Ambulatory Visit: Payer: Medicare HMO

## 2016-01-20 DIAGNOSIS — F331 Major depressive disorder, recurrent, moderate: Secondary | ICD-10-CM

## 2016-01-20 NOTE — BH Specialist Note (Signed)
Nicholas Whitehead was lethargic today, reporting increased depression lately and low motivation/energy. He endorses a history of seasonal depression. We discussed increasing his socialization and he has a friend named Nicholas Whitehead who will do things with him sometimes. He also feels that his Zoloft is no longer helping, so I had him sign a release to his PCP, Nicholas Calico, MD, who prescribes that. I plan to contact him via Epic re: this. Plan to meet again in 2 weeks. Curley Spice, LCSW

## 2016-01-24 ENCOUNTER — Other Ambulatory Visit: Payer: Commercial Managed Care - HMO

## 2016-01-25 ENCOUNTER — Other Ambulatory Visit (INDEPENDENT_AMBULATORY_CARE_PROVIDER_SITE_OTHER): Payer: Medicare HMO

## 2016-01-25 ENCOUNTER — Other Ambulatory Visit: Payer: Self-pay | Admitting: Endocrinology

## 2016-01-25 DIAGNOSIS — E1165 Type 2 diabetes mellitus with hyperglycemia: Secondary | ICD-10-CM | POA: Diagnosis not present

## 2016-01-25 DIAGNOSIS — Z794 Long term (current) use of insulin: Secondary | ICD-10-CM | POA: Diagnosis not present

## 2016-01-25 LAB — BASIC METABOLIC PANEL
BUN: 10 mg/dL (ref 6–23)
CHLORIDE: 106 meq/L (ref 96–112)
CO2: 28 meq/L (ref 19–32)
Calcium: 9.3 mg/dL (ref 8.4–10.5)
Creatinine, Ser: 1.32 mg/dL (ref 0.40–1.50)
GFR: 71.59 mL/min (ref >60.00)
Glucose, Bld: 201 mg/dL — ABNORMAL HIGH (ref 70–99)
POTASSIUM: 4.1 meq/L (ref 3.5–5.1)
SODIUM: 140 meq/L (ref 135–145)

## 2016-01-25 LAB — LIPID PANEL
CHOL/HDL RATIO: 3
Cholesterol: 110 mg/dL (ref 0–200)
HDL: 40.4 mg/dL (ref >39.00)
LDL CALC: 45 mg/dL (ref 0–99)
NONHDL: 69.36
TRIGLYCERIDES: 120 mg/dL (ref 0.0–149.0)
VLDL: 24 mg/dL (ref 0.0–40.0)

## 2016-01-25 LAB — HEMOGLOBIN A1C: Hgb A1c MFr Bld: 8.7 % — ABNORMAL HIGH (ref 4.6–6.5)

## 2016-01-27 ENCOUNTER — Ambulatory Visit: Payer: Commercial Managed Care - HMO | Admitting: Endocrinology

## 2016-01-28 ENCOUNTER — Other Ambulatory Visit: Payer: Self-pay | Admitting: Internal Medicine

## 2016-02-01 ENCOUNTER — Telehealth: Payer: Self-pay | Admitting: Internal Medicine

## 2016-02-01 ENCOUNTER — Other Ambulatory Visit: Payer: Self-pay | Admitting: Internal Medicine

## 2016-02-01 DIAGNOSIS — F329 Major depressive disorder, single episode, unspecified: Secondary | ICD-10-CM

## 2016-02-01 DIAGNOSIS — F419 Anxiety disorder, unspecified: Principal | ICD-10-CM

## 2016-02-01 MED ORDER — TRAZODONE HCL 300 MG PO TABS: 300.0000 mg | ORAL_TABLET | Freq: Every day | ORAL | 3 refills | Status: DC

## 2016-02-01 NOTE — Telephone Encounter (Signed)
Pt states he is need of a refill of trazodone (DESYREL) 300 MG tablet [id number]  He knows he needs to see you. I scheduled him to come in on Monday 1/29 and informed him to be sure he comes in.  His pharmacy is [facility] on [address].

## 2016-02-02 ENCOUNTER — Other Ambulatory Visit: Payer: Self-pay | Admitting: Internal Medicine

## 2016-02-02 DIAGNOSIS — F32A Depression, unspecified: Secondary | ICD-10-CM

## 2016-02-02 DIAGNOSIS — F329 Major depressive disorder, single episode, unspecified: Secondary | ICD-10-CM

## 2016-02-02 DIAGNOSIS — F419 Anxiety disorder, unspecified: Secondary | ICD-10-CM

## 2016-02-03 ENCOUNTER — Ambulatory Visit: Payer: Medicare HMO

## 2016-02-03 ENCOUNTER — Telehealth: Payer: Self-pay | Admitting: Endocrinology

## 2016-02-03 NOTE — Telephone Encounter (Signed)
Patient is returning your call.  

## 2016-02-07 ENCOUNTER — Encounter: Payer: Self-pay | Admitting: Internal Medicine

## 2016-02-07 ENCOUNTER — Ambulatory Visit (INDEPENDENT_AMBULATORY_CARE_PROVIDER_SITE_OTHER): Payer: Medicare HMO | Admitting: Internal Medicine

## 2016-02-07 VITALS — BP 158/90 | HR 55 | Temp 97.6°F | Ht 73.0 in | Wt 225.5 lb

## 2016-02-07 DIAGNOSIS — F418 Other specified anxiety disorders: Secondary | ICD-10-CM | POA: Diagnosis not present

## 2016-02-07 DIAGNOSIS — I10 Essential (primary) hypertension: Secondary | ICD-10-CM

## 2016-02-07 DIAGNOSIS — F419 Anxiety disorder, unspecified: Secondary | ICD-10-CM

## 2016-02-07 DIAGNOSIS — F32A Depression, unspecified: Secondary | ICD-10-CM

## 2016-02-07 DIAGNOSIS — I739 Peripheral vascular disease, unspecified: Secondary | ICD-10-CM | POA: Insufficient documentation

## 2016-02-07 DIAGNOSIS — J410 Simple chronic bronchitis: Secondary | ICD-10-CM

## 2016-02-07 DIAGNOSIS — F329 Major depressive disorder, single episode, unspecified: Secondary | ICD-10-CM

## 2016-02-07 MED ORDER — GLYCOPYRROLATE-FORMOTEROL 9-4.8 MCG/ACT IN AERO: 2.0000 | INHALATION_SPRAY | Freq: Two times a day (BID) | RESPIRATORY_TRACT | 11 refills | Status: DC

## 2016-02-07 MED ORDER — DULOXETINE HCL 60 MG PO CPEP
60.0000 mg | ORAL_CAPSULE | Freq: Every day | ORAL | 3 refills | Status: DC
Start: 2016-02-07 — End: 2016-10-24

## 2016-02-07 NOTE — Patient Instructions (Signed)
Chronic Obstructive Pulmonary Disease Chronic obstructive pulmonary disease (COPD) is a common lung condition in which airflow from the lungs is limited. COPD is a general term that can be used to describe many different lung problems that limit airflow, including both chronic bronchitis and emphysema. If you have COPD, your lung function will probably never return to normal, but there are measures you can take to improve lung function and make yourself feel better. What are the causes?  Smoking (common).  Exposure to secondhand smoke.  Genetic problems.  Chronic inflammatory lung diseases or recurrent infections. What are the signs or symptoms?  Shortness of breath, especially with physical activity.  Deep, persistent (chronic) cough with a large amount of thick mucus.  Wheezing.  Rapid breaths (tachypnea).  Gray or bluish discoloration (cyanosis) of the skin, especially in your fingers, toes, or lips.  Fatigue.  Weight loss.  Frequent infections or episodes when breathing symptoms become much worse (exacerbations).  Chest tightness. How is this diagnosed? Your health care provider will take a medical history and perform a physical examination to diagnose COPD. Additional tests for COPD may include:  Lung (pulmonary) function tests.  Chest X-ray.  CT scan.  Blood tests. How is this treated? Treatment for COPD may include:  Inhaler and nebulizer medicines. These help manage the symptoms of COPD and make your breathing more comfortable.  Supplemental oxygen. Supplemental oxygen is only helpful if you have a low oxygen level in your blood.  Exercise and physical activity. These are beneficial for nearly all people with COPD.  Lung surgery or transplant.  Nutrition therapy to gain weight, if you are underweight.  Pulmonary rehabilitation. This may involve working with a team of health care providers and specialists, such as respiratory, occupational, and physical  therapists. Follow these instructions at home:  Take all medicines (inhaled or pills) as directed by your health care provider.  Avoid over-the-counter medicines or cough syrups that dry up your airway (such as antihistamines) and slow down the elimination of secretions unless instructed otherwise by your health care provider.  If you are a smoker, the most important thing that you can do is stop smoking. Continuing to smoke will cause further lung damage and breathing trouble. Ask your health care provider for help with quitting smoking. He or she can direct you to community resources or hospitals that provide support.  Avoid exposure to irritants such as smoke, chemicals, and fumes that aggravate your breathing.  Use oxygen therapy and pulmonary rehabilitation if directed by your health care provider. If you require home oxygen therapy, ask your health care provider whether you should purchase a pulse oximeter to measure your oxygen level at home.  Avoid contact with individuals who have a contagious illness.  Avoid extreme temperature and humidity changes.  Eat healthy foods. Eating smaller, more frequent meals and resting before meals may help you maintain your strength.  Stay active, but balance activity with periods of rest. Exercise and physical activity will help you maintain your ability to do things you want to do.  Preventing infection and hospitalization is very important when you have COPD. Make sure to receive all the vaccines your health care provider recommends, especially the pneumococcal and influenza vaccines. Ask your health care provider whether you need a pneumonia vaccine.  Learn and use relaxation techniques to manage stress.  Learn and use controlled breathing techniques as directed by your health care provider. Controlled breathing techniques include: 1. Pursed lip breathing. Start by breathing in (inhaling)   through your nose for 1 second. Then, purse your lips as  if you were going to whistle and breathe out (exhale) through the pursed lips for 2 seconds. 2. Diaphragmatic breathing. Start by putting one hand on your abdomen just above your waist. Inhale slowly through your nose. The hand on your abdomen should move out. Then purse your lips and exhale slowly. You should be able to feel the hand on your abdomen moving in as you exhale.  Learn and use controlled coughing to clear mucus from your lungs. Controlled coughing is a series of short, progressive coughs. The steps of controlled coughing are: 1. Lean your head slightly forward. 2. Breathe in deeply using diaphragmatic breathing. 3. Try to hold your breath for 3 seconds. 4. Keep your mouth slightly open while coughing twice. 5. Spit any mucus out into a tissue. 6. Rest and repeat the steps once or twice as needed. Contact a health care provider if:  You are coughing up more mucus than usual.  There is a change in the color or thickness of your mucus.  Your breathing is more labored than usual.  Your breathing is faster than usual. Get help right away if:  You have shortness of breath while you are resting.  You have shortness of breath that prevents you from:  Being able to talk.  Performing your usual physical activities.  You have chest pain lasting longer than 5 minutes.  Your skin color is more cyanotic than usual.  You measure low oxygen saturations for longer than 5 minutes with a pulse oximeter. This information is not intended to replace advice given to you by your health care provider. Make sure you discuss any questions you have with your health care provider. Document Released: 10/05/2004 Document Revised: 06/03/2015 Document Reviewed: 08/22/2012 Elsevier Interactive Patient Education  2017 Elsevier Inc.  

## 2016-02-07 NOTE — Progress Notes (Signed)
Pre visit review using our clinic review tool, if applicable. No additional management support is needed unless otherwise documented below in the visit note. 

## 2016-02-07 NOTE — Progress Notes (Signed)
Subjective:  Patient ID: Nicholas Whitehead, male    DOB: [birth year]  Age: 61 y.o. MRN: [id number]  CC: Hypertension and Depression   HPI Nicholas Whitehead presents for Follow-up. His main complaint today is a smoldering depression. He complains of anhedonia, sleep craving with difficulty sleeping and anxiety, fatigue, apathy, and feeling hopeless. He denies suicidal or homicidal ideations. He was to continue trazodone but would like to try an antidepressant other than sertraline.  He also complains of cramps in his calves sometimes well he is sleeping and some time while he is active.  He wants to try an inhaler other than ANORO. He doesn't feel like he is getting much benefit from it and he continues to have episodes of wheezing and shortness of breath.  Outpatient Medications Prior to Visit  Medication Sig Dispense Refill  . ACCU-CHEK FASTCLIX LANCETS MISC USE  TO CHECK BLOOD SUGAR THREE TIMES DAILY 306 each 1  . ACCU-CHEK SMARTVIEW test strip USE AS DIRECTED  TO CHECK BLOOD SUGAR THREE TIMES DAILY 300 each 1  . aspirin EC 81 MG tablet Take 81 mg by mouth every morning.    . B-D ULTRAFINE III SHORT PEN 31G X 8 MM MISC use as directed 300 each 3  . BYSTOLIC 10 MG tablet take 2 tablets by mouth daily 60 tablet 11  . cetirizine (ZYRTEC) 10 MG tablet Take 10 mg by mouth daily.    . clonazePAM (KLONOPIN) 1 MG tablet Take 1 tablet (1 mg total) by mouth 2 (two) times daily as needed for anxiety. 60 tablet 5  . COMPLERA 200-25-300 MG tablet Take 1 tablet by mouth daily. 30 tablet 5  . docusate sodium (COLACE) 100 MG capsule Take 100 mg by mouth every morning.    . gabapentin (NEURONTIN) 300 MG capsule take 1 capsule by mouth three times a day 90 capsule 3  . irbesartan (AVAPRO) 300 MG tablet Take 1 tablet (300 mg total) by mouth daily. 90 tablet 3  . LANTUS SOLOSTAR 100 UNIT/ML Solostar Pen INJECT 55 UNITS SUBCUTANEOUSLY EVERY MORNING AND INJECT 60 UNITS EVERY EVENING 30 mL 3  . Melatonin 3 MG TABS  Take by mouth at bedtime.    Marland Kitchen NOVOLOG FLEXPEN 100 UNIT/ML FlexPen inject 20 to 25 units subcutaneously three times a day with meals 15 mL 1  . pioglitazone (ACTOS) 30 MG tablet take 1 tablet by mouth once daily 30 tablet 3  . pravastatin (PRAVACHOL) 40 MG tablet take 1 tablet by mouth every evening 30 tablet 4  . sildenafil (VIAGRA) 100 MG tablet Take 1 tablet (100 mg total) by mouth daily as needed for erectile dysfunction. 12 tablet 11  . trazodone (DESYREL) 300 MG tablet Take 1 tablet (300 mg total) by mouth at bedtime. 90 tablet 3  . triamcinolone cream (KENALOG) 0.5 % APPLY 1 APPLICATION TOPICALLY THREE TIME A DAY 100 g 1  . sertraline (ZOLOFT) 100 MG tablet take 1 tablet by mouth once daily BEFORE BREAKFAST 30 tablet 11  . umeclidinium-vilanterol (ANORO ELLIPTA) 62.5-25 MCG/INH AEPB Inhale 1 puff into the lungs daily. 30 each 11   No facility-administered medications prior to visit.     ROS Review of Systems  Constitutional: Negative for appetite change, diaphoresis, fatigue and unexpected weight change.  HENT: Negative.  Negative for trouble swallowing.   Eyes: Negative for photophobia and visual disturbance.  Respiratory: Positive for shortness of breath and wheezing. Negative for cough, choking, chest tightness and stridor.   Cardiovascular: Negative.  Negative  for chest pain, palpitations and leg swelling.  Gastrointestinal: Negative for abdominal pain, constipation, diarrhea, nausea and vomiting.  Endocrine: Negative.   Genitourinary: Negative.   Musculoskeletal: Negative.  Negative for back pain, myalgias and neck pain.  Skin: Negative.  Negative for color change and rash.  Allergic/Immunologic: Negative.   Neurological: Negative.  Negative for dizziness, weakness and headaches.  Hematological: Negative.  Negative for adenopathy. Does not bruise/bleed easily.  Psychiatric/Behavioral: Positive for dysphoric mood and sleep disturbance. Negative for agitation, behavioral  problems, confusion, decreased concentration, hallucinations, self-injury and suicidal ideas. The patient is nervous/anxious. The patient is not hyperactive.     Objective:  BP (!) 158/90 (BP Location: Left Arm, Patient Position: Sitting, Cuff Size: Normal)   Pulse (!) 55   Temp 97.6 F (36.4 C) (Oral)   Ht 6\' 1"  (1.854 m)   Wt 225 lb 8 oz (102.3 kg)   SpO2 93%   BMI 29.75 kg/m   BP Readings from Last 3 Encounters:  02/07/16 (!) 158/90  12/23/15 (!) 160/80  10/28/15 122/82    Wt Readings from Last 3 Encounters:  02/07/16 225 lb 8 oz (102.3 kg)  12/23/15 205 lb 1.9 oz (93 kg)  10/28/15 194 lb 12.8 oz (88.4 kg)    Physical Exam  Constitutional: He is oriented to person, place, and time. No distress.  HENT:  Mouth/Throat: Oropharynx is clear and moist. No oropharyngeal exudate.  Eyes: Conjunctivae are normal. Right eye exhibits no discharge. Left eye exhibits no discharge. No scleral icterus.  Neck: Normal range of motion. Neck supple. No JVD present. No tracheal deviation present. No thyromegaly present.  Cardiovascular: Normal rate, regular rhythm, normal heart sounds and intact distal pulses.  Exam reveals no gallop and no friction rub.   No murmur heard. Pulmonary/Chest: Effort normal and breath sounds normal. No stridor. No respiratory distress. He has no wheezes. He has no rales. He exhibits no tenderness.  Abdominal: Soft. Bowel sounds are normal. He exhibits no distension and no mass. There is no tenderness. There is no rebound and no guarding.  Musculoskeletal: Normal range of motion. He exhibits no edema, tenderness or deformity.  Lymphadenopathy:    He has no cervical adenopathy.  Neurological: He is oriented to person, place, and time.  Skin: Skin is warm and dry. No rash noted. He is not diaphoretic. No erythema. No pallor.  Psychiatric: Judgment normal. His mood appears not anxious. His affect is not angry. His speech is not rapid and/or pressured, not delayed and  not tangential. He is not agitated, not aggressive, not slowed and not withdrawn. Cognition and memory are normal. He exhibits a depressed mood. He expresses no homicidal and no suicidal ideation. He expresses no suicidal plans and no homicidal plans. He is attentive.  Vitals reviewed.   Lab Results  Component Value Date   WBC 7.9 09/06/2015   HGB 15.4 09/06/2015   HCT 45.1 09/06/2015   PLT 143.0 (L) 09/06/2015   GLUCOSE 201 (H) 01/25/2016   CHOL 110 01/25/2016   TRIG 120.0 01/25/2016   HDL 40.40 01/25/2016   LDLCALC 45 01/25/2016   ALT 18 10/25/2015   AST 21 10/25/2015   NA 140 01/25/2016   K 4.1 01/25/2016   CL 106 01/25/2016   CREATININE 1.32 01/25/2016   BUN 10 01/25/2016   CO2 28 01/25/2016   TSH 0.93 03/25/2015   PSA 0.82 03/25/2015   HGBA1C 8.7 (H) 01/25/2016   MICROALBUR 8.7 (H) 10/28/2015    Mr Brain Wo  Contrast  Result Date: 07/06/2015 CLINICAL DATA:  Memory loss, confusion, and depression. EXAM: MRI HEAD WITHOUT CONTRAST TECHNIQUE: Multiplanar, multiecho pulse sequences of the brain and surrounding structures were obtained without intravenous contrast. COMPARISON:  09/18/2006 FINDINGS: There is no evidence of acute infarct, intracranial hemorrhage, mass, midline shift, or extra-axial fluid collection. Ventricles and sulci are unchanged and within normal limits. The brain is normal in signal. Prior right cataract extraction is noted. A small right maxillary sinus mucous retention cyst and mild left maxillary sinus mucosal thickening are noted. The mastoid air cells are clear. Major intracranial vascular flow voids are preserved. Bone marrow signal is unremarkable. IMPRESSION: Unremarkable appearance of the brain. Electronically Signed   By: Logan Bores M.D.   On: 07/06/2015 13:22    Assessment & Plan:   Candace was seen today for hypertension and depression.  Diagnoses and all orders for this visit:  Simple chronic bronchitis ([place])- will change to a different  lobular/lama inhaler to see if he gets better symptom relief -     Glycopyrrolate-Formoterol (BEVESPI AEROSPHERE) 9-4.8 MCG/ACT AERO; Inhale 2 puffs into the lungs 2 (two) times daily.  Anxiety and depression- will continue trazodone and Klonopin, will change his antidepressant from sertraline to duloxetine to see if he benefits from norepinephrine support -     DULoxetine (CYMBALTA) 60 MG capsule; Take 1 capsule (60 mg total) by mouth daily.  Intermittent claudication (HCC)- his symptoms and exam are suspicious for PAD, I have ordered an ABI to see how severe this is. -     VAS [name] ABI WITH/WO TBI; Future  Essential hypertension- considering his comorbid illnesses his blood pressure is adequately well controlled.   I have discontinued Mr. Dicks umeclidinium-vilanterol and sertraline. I am also having him start on Glycopyrrolate-Formoterol and DULoxetine. Additionally, I am having him maintain his cetirizine, aspirin EC, docusate sodium, Melatonin, irbesartan, COMPLERA, ACCU-CHEK FASTCLIX LANCETS, ACCU-CHEK SMARTVIEW, triamcinolone cream, sildenafil, clonazePAM, pravastatin, LANTUS SOLOSTAR, gabapentin, pioglitazone, BYSTOLIC, B-D ULTRAFINE III SHORT PEN, NOVOLOG FLEXPEN, and trazodone.  Meds ordered this encounter  Medications  . Glycopyrrolate-Formoterol (BEVESPI AEROSPHERE) 9-4.8 MCG/ACT AERO    Sig: Inhale 2 puffs into the lungs 2 (two) times daily.    Dispense:  10.7 g    Refill:  11  . DULoxetine (CYMBALTA) 60 MG capsule    Sig: Take 1 capsule (60 mg total) by mouth daily.    Dispense:  90 capsule    Refill:  3     Follow-up: Return in about 3 months (around 05/07/2016).  Scarlette Calico, MD

## 2016-02-21 ENCOUNTER — Other Ambulatory Visit: Payer: Self-pay | Admitting: Internal Medicine

## 2016-02-22 ENCOUNTER — Other Ambulatory Visit: Payer: Self-pay | Admitting: Endocrinology

## 2016-03-02 ENCOUNTER — Ambulatory Visit: Payer: Medicare HMO

## 2016-03-02 DIAGNOSIS — F332 Major depressive disorder, recurrent severe without psychotic features: Secondary | ICD-10-CM
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
## 2016-03-07 ENCOUNTER — Other Ambulatory Visit: Payer: Self-pay | Admitting: *Deleted

## 2016-03-07 ENCOUNTER — Other Ambulatory Visit: Payer: Self-pay | Admitting: Endocrinology

## 2016-03-07 DIAGNOSIS — B20 Human immunodeficiency virus [HIV] disease: Secondary | ICD-10-CM

## 2016-03-07 MED ORDER — COMPLERA 200-25-300 MG PO TABS: 1.0000 | ORAL_TABLET | Freq: Every day | ORAL | 2 refills | Status: DC

## 2016-03-08 ENCOUNTER — Other Ambulatory Visit: Payer: Self-pay | Admitting: *Deleted

## 2016-03-08 DIAGNOSIS — B20 Human immunodeficiency virus [HIV] disease: Secondary | ICD-10-CM

## 2016-03-08 MED ORDER — COMPLERA 200-25-300 MG PO TABS: 1.0000 | ORAL_TABLET | Freq: Every day | ORAL | 2 refills | Status: DC

## 2016-03-08 NOTE — Addendum Note (Signed)
Addended by: Lorne Skeens D on: 03/08/2016 12:59 PM   Modules accepted: Orders

## 2016-03-09 ENCOUNTER — Ambulatory Visit: Payer: Medicare HMO

## 2016-03-09 ENCOUNTER — Other Ambulatory Visit: Payer: Self-pay | Admitting: Endocrinology

## 2016-03-09 DIAGNOSIS — F331 Major depressive disorder, recurrent, moderate: Secondary | ICD-10-CM
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
## 2016-03-14 ENCOUNTER — Other Ambulatory Visit: Payer: Self-pay | Admitting: Endocrinology

## 2016-03-14 ENCOUNTER — Other Ambulatory Visit: Payer: Self-pay | Admitting: Internal Medicine

## 2016-03-14 DIAGNOSIS — I10 Essential (primary) hypertension: Secondary | ICD-10-CM

## 2016-03-14 DIAGNOSIS — E1140 Type 2 diabetes mellitus with diabetic neuropathy, unspecified: Secondary | ICD-10-CM

## 2016-03-15 ENCOUNTER — Ambulatory Visit: Payer: Self-pay

## 2016-03-15 DIAGNOSIS — F331 Major depressive disorder, recurrent, moderate: Secondary | ICD-10-CM
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
## 2016-03-17 ENCOUNTER — Other Ambulatory Visit: Payer: Self-pay | Admitting: Internal Medicine

## 2016-03-17 DIAGNOSIS — F419 Anxiety disorder, unspecified: Secondary | ICD-10-CM

## 2016-03-17 DIAGNOSIS — F329 Major depressive disorder, single episode, unspecified: Secondary | ICD-10-CM

## 2016-03-17 DIAGNOSIS — F411 Generalized anxiety disorder: Secondary | ICD-10-CM

## 2016-03-18 NOTE — Telephone Encounter (Signed)
rx faxed to pharmacy

## 2016-03-23 ENCOUNTER — Ambulatory Visit: Payer: Medicare HMO

## 2016-03-25 ENCOUNTER — Other Ambulatory Visit: Payer: Self-pay | Admitting: Endocrinology

## 2016-03-30 ENCOUNTER — Telehealth: Payer: Self-pay | Admitting: Endocrinology

## 2016-03-30 NOTE — Telephone Encounter (Signed)
Pt needs a letter stating how frequently he will be seen this year for his disability please.

## 2016-03-31 NOTE — Telephone Encounter (Signed)
He is overdue for his visit, we will give him the letter when he comes in, please make appointment with labs

## 2016-04-04 ENCOUNTER — Ambulatory Visit: Payer: Medicare HMO

## 2016-04-04 DIAGNOSIS — F331 Major depressive disorder, recurrent, moderate: Secondary | ICD-10-CM

## 2016-04-04 NOTE — BH Specialist Note (Signed)
Maurisio was moving rather slow today and talking softer than usual. But he was able to laugh and joke some as the session progressed. He said he has not been able to clean up much, so his apartment looks like a mess - to the extent that he told his brother and sister he was busy when they tried to come and visit him. I suggested he get a case manager from [employer] to set him up with Meals [employer] Wheels and possibly an in home aid to help with cleaning up. Plan to meet again in 2 weeks. Curley Spice, LCSW

## 2016-04-18 ENCOUNTER — Ambulatory Visit (INDEPENDENT_AMBULATORY_CARE_PROVIDER_SITE_OTHER): Payer: Medicare HMO

## 2016-04-18 DIAGNOSIS — F331 Major depressive disorder, recurrent, moderate: Secondary | ICD-10-CM

## 2016-04-18 NOTE — BH Specialist Note (Signed)
Nicholas Whitehead was in a pleasant mood today and said he is feeling much better lately. He said he has been socializing with a younger couple some and this has given him a good feeling. I asked if he had been to [employer] and he said yes, but it had been years. I suggested he give it another try and he agreed to call. Plan to meet again in 2 weeks. Curley Spice, LCSW

## 2016-04-21 ENCOUNTER — Other Ambulatory Visit: Payer: Self-pay | Admitting: Endocrinology

## 2016-04-28 ENCOUNTER — Telehealth: Payer: Self-pay | Admitting: *Deleted

## 2016-04-28 NOTE — Telephone Encounter (Signed)
Patient received call, did not know what it was regarding.  Pt has an appointment with Grayland Ormond 4/24. Of note, his SPAP may have expired 3/31.  Does he need renewal?

## 2016-04-29 ENCOUNTER — Other Ambulatory Visit: Payer: Self-pay | Admitting: Endocrinology

## 2016-04-29 DIAGNOSIS — E1165 Type 2 diabetes mellitus with hyperglycemia: Secondary | ICD-10-CM

## 2016-04-29 DIAGNOSIS — Z794 Long term (current) use of insulin: Principal | ICD-10-CM

## 2016-05-02 ENCOUNTER — Ambulatory Visit: Payer: Medicare HMO

## 2016-05-02 ENCOUNTER — Other Ambulatory Visit (INDEPENDENT_AMBULATORY_CARE_PROVIDER_SITE_OTHER): Payer: Medicare HMO

## 2016-05-02 ENCOUNTER — Telehealth: Payer: Self-pay | Admitting: Endocrinology

## 2016-05-02 DIAGNOSIS — E1165 Type 2 diabetes mellitus with hyperglycemia: Secondary | ICD-10-CM

## 2016-05-02 DIAGNOSIS — F331 Major depressive disorder, recurrent, moderate: Secondary | ICD-10-CM

## 2016-05-02 DIAGNOSIS — Z794 Long term (current) use of insulin: Secondary | ICD-10-CM | POA: Diagnosis not present

## 2016-05-02 LAB — BASIC METABOLIC PANEL
BUN: 11 mg/dL (ref 6–23)
CALCIUM: 9.2 mg/dL (ref 8.4–10.5)
CO2: 28 mEq/L (ref 19–32)
CREATININE: 0.97 mg/dL (ref 0.40–1.50)
Chloride: 101 mEq/L (ref 96–112)
GFR: 102.06 mL/min (ref >60.00)
Glucose, Bld: 262 mg/dL — ABNORMAL HIGH (ref 70–99)
Potassium: 4.3 mEq/L (ref 3.5–5.1)
Sodium: 137 mEq/L (ref 135–145)

## 2016-05-02 LAB — HEMOGLOBIN A1C: HEMOGLOBIN A1C: 9.7 % — AB (ref 4.6–6.5)

## 2016-05-02 NOTE — Telephone Encounter (Signed)
Patient stated that someone called him in reference to his social security and a form from an office. Patient was not sure who it was and was not sure of any further details. Documenting for record of conversation. Please advise if familiar with call.

## 2016-05-02 NOTE — BH Specialist Note (Signed)
Nicholas Whitehead was in a fairly good mood today. Unfortunately, I had to inform him that my position is going away and I will be leaving. He asked what to do and I gave him the contact information for [facility] [place]. He agreed to contact them and go there for counseling after I leave. Plan to see him one more time before I leave. Curley Spice, LCSW

## 2016-05-08 ENCOUNTER — Ambulatory Visit: Payer: Medicare HMO | Admitting: Internal Medicine

## 2016-05-08 ENCOUNTER — Other Ambulatory Visit: Payer: Self-pay | Admitting: Endocrinology

## 2016-05-09 ENCOUNTER — Ambulatory Visit (INDEPENDENT_AMBULATORY_CARE_PROVIDER_SITE_OTHER): Payer: Medicare HMO | Admitting: Internal Medicine

## 2016-05-09 ENCOUNTER — Encounter: Payer: Self-pay | Admitting: Internal Medicine

## 2016-05-09 VITALS — BP 136/70 | HR 56 | Temp 98.7°F | Resp 16 | Ht 73.0 in | Wt 194.5 lb

## 2016-05-09 DIAGNOSIS — E1140 Type 2 diabetes mellitus with diabetic neuropathy, unspecified: Secondary | ICD-10-CM

## 2016-05-09 DIAGNOSIS — I10 Essential (primary) hypertension: Secondary | ICD-10-CM | POA: Diagnosis not present

## 2016-05-09 DIAGNOSIS — Z794 Long term (current) use of insulin: Secondary | ICD-10-CM

## 2016-05-09 NOTE — Progress Notes (Signed)
Pre visit review using our clinic review tool, if applicable. No additional management support is needed unless otherwise documented below in the visit note. 

## 2016-05-09 NOTE — Progress Notes (Signed)
Subjective:  Patient ID: Nicholas Whitehead, male    DOB: 13-Jun-1955  Age: 61 y.o. MRN: [id number]  CC: Hypertension and Diabetes   HPI Nicholas Whitehead presents for f/up - he offers no new concerns or complaints. His recent A1C was up to 9.7%, he seen his ENDO doctor later this week.  Outpatient Medications Prior to Visit  Medication Sig Dispense Refill  . ACCU-CHEK FASTCLIX LANCETS MISC USE  TO CHECK BLOOD SUGAR THREE TIMES DAILY 306 each 1  . ACCU-CHEK SMARTVIEW test strip USE AS DIRECTED  TO CHECK BLOOD SUGAR THREE TIMES DAILY 300 each 1  . aspirin EC 81 MG tablet Take 81 mg by mouth every morning.    . B-D ULTRAFINE III SHORT PEN 31G X 8 MM MISC use as directed 300 each 3  . BYSTOLIC 10 MG tablet take 2 tablets by mouth daily 60 tablet 11  . cetirizine (ZYRTEC) 10 MG tablet Take 10 mg by mouth daily.    . clonazePAM (KLONOPIN) 1 MG tablet take 1 tablet by mouth twice a day if needed for anxiety 60 tablet 5  . COMPLERA 200-25-300 MG tablet Take 1 tablet by mouth daily. 30 tablet 2  . docusate sodium (COLACE) 100 MG capsule Take 100 mg by mouth every morning.    . DULoxetine (CYMBALTA) 60 MG capsule Take 1 capsule (60 mg total) by mouth daily. 90 capsule 3  . gabapentin (NEURONTIN) 300 MG capsule take 1 capsule by mouth three times a day 90 capsule 3  . Glycopyrrolate-Formoterol (BEVESPI AEROSPHERE) 9-4.8 MCG/ACT AERO Inhale 2 puffs into the lungs 2 (two) times daily. 10.7 g 11  . irbesartan (AVAPRO) 300 MG tablet take 1 tablet by mouth once daily 90 tablet 3  . LANTUS SOLOSTAR 100 UNIT/ML Solostar Pen inject 55 units every morning and 60 units every evening 30 mL 0  . Melatonin 3 MG TABS Take by mouth at bedtime.    Marland Kitchen NOVOLOG FLEXPEN 100 UNIT/ML FlexPen inject 20 to 25 units subcutaneously three times a day with meals 15 mL 1  . pioglitazone (ACTOS) 30 MG tablet take 1 tablet by mouth once daily 30 tablet 3  . pravastatin (PRAVACHOL) 40 MG tablet take 1 tablet by mouth every evening 90  tablet 0  . sildenafil (VIAGRA) 100 MG tablet Take 1 tablet (100 mg total) by mouth daily as needed for erectile dysfunction. 12 tablet 11  . trazodone (DESYREL) 300 MG tablet Take 1 tablet (300 mg total) by mouth at bedtime. 90 tablet 3  . triamcinolone cream (KENALOG) 0.5 % APPLY 1 APPLICATION TOPICALLY THREE TIME A DAY 100 g 1   No facility-administered medications prior to visit.     ROS Review of Systems  Constitutional: Negative for chills, diaphoresis, fatigue and unexpected weight change.  HENT: Negative.   Eyes: Negative.  Negative for visual disturbance.  Respiratory: Negative for cough, chest tightness, shortness of breath and wheezing.   Cardiovascular: Negative for chest pain, palpitations and leg swelling.  Gastrointestinal: Negative for abdominal pain, constipation, diarrhea, nausea and vomiting.  Endocrine: Negative for cold intolerance, heat intolerance, polydipsia, polyphagia and polyuria.  Genitourinary: Negative.  Negative for difficulty urinating, dysuria and urgency.  Musculoskeletal: Negative.  Negative for back pain and myalgias.  Skin: Negative.  Negative for rash.  Allergic/Immunologic: Negative.   Neurological: Negative.   Hematological: Negative for adenopathy. Does not bruise/bleed easily.  Psychiatric/Behavioral: Negative for behavioral problems, confusion, decreased concentration, dysphoric mood, self-injury, sleep disturbance and suicidal ideas. The patient  is nervous/anxious.     Objective:  BP 136/70 (BP Location: Right Arm, Patient Position: Sitting, Cuff Size: Normal)   Pulse (!) 56   Temp 98.7 F (37.1 C) (Oral)   Resp 16   Ht 6\' 1"  (1.854 m)   Wt 194 lb 8 oz (88.2 kg)   SpO2 97%   BMI 25.66 kg/m   BP Readings from Last 3 Encounters:  05/09/16 136/70  02/07/16 (!) 158/90  12/23/15 (!) 160/80    Wt Readings from Last 3 Encounters:  05/09/16 194 lb 8 oz (88.2 kg)  02/07/16 225 lb 8 oz (102.3 kg)  12/23/15 205 lb 1.9 oz (93 kg)     Physical Exam  Constitutional: He is oriented to person, place, and time. No distress.  HENT:  Mouth/Throat: Oropharynx is clear and moist. No oropharyngeal exudate.  Eyes: Conjunctivae are normal. Right eye exhibits no discharge. Left eye exhibits no discharge. No scleral icterus.  Neck: Normal range of motion. Neck supple. No JVD present. No tracheal deviation present. No thyromegaly present.  Cardiovascular: Normal rate, regular rhythm, normal heart sounds and intact distal pulses.  Exam reveals no gallop and no friction rub.   No murmur heard. Pulmonary/Chest: Effort normal and breath sounds normal. No stridor. No respiratory distress. He has no wheezes. He has no rales. He exhibits no tenderness.  Abdominal: Soft. Bowel sounds are normal. He exhibits no distension and no mass. There is no tenderness. There is no rebound and no guarding.  Musculoskeletal: Normal range of motion. He exhibits no edema, tenderness or deformity.  Lymphadenopathy:    He has no cervical adenopathy.  Neurological: He is oriented to person, place, and time.  Skin: Skin is warm and dry. No rash noted. He is not diaphoretic. No erythema. No pallor.  Vitals reviewed.   Lab Results  Component Value Date   WBC 7.9 09/06/2015   HGB 15.4 09/06/2015   HCT 45.1 09/06/2015   PLT 143.0 (L) 09/06/2015   GLUCOSE 262 (H) 05/02/2016   CHOL 110 01/25/2016   TRIG 120.0 01/25/2016   HDL 40.40 01/25/2016   LDLCALC 45 01/25/2016   ALT 18 10/25/2015   AST 21 10/25/2015   NA 137 05/02/2016   K 4.3 05/02/2016   CL 101 05/02/2016   CREATININE 0.97 05/02/2016   BUN 11 05/02/2016   CO2 28 05/02/2016   TSH 0.93 03/25/2015   PSA 0.82 03/25/2015   HGBA1C 9.7 (H) 05/02/2016   MICROALBUR 8.7 (H) 10/28/2015    Mr Brain Wo Contrast  Result Date: 07/06/2015 CLINICAL DATA:  Memory loss, confusion, and depression. EXAM: MRI HEAD WITHOUT CONTRAST TECHNIQUE: Multiplanar, multiecho pulse sequences of the brain and  surrounding structures were obtained without intravenous contrast. COMPARISON:  09/18/2006 FINDINGS: There is no evidence of acute infarct, intracranial hemorrhage, mass, midline shift, or extra-axial fluid collection. Ventricles and sulci are unchanged and within normal limits. The brain is normal in signal. Prior right cataract extraction is noted. A small right maxillary sinus mucous retention cyst and mild left maxillary sinus mucosal thickening are noted. The mastoid air cells are clear. Major intracranial vascular flow voids are preserved. Bone marrow signal is unremarkable. IMPRESSION: Unremarkable appearance of the brain. Electronically Signed   By: Logan Bores M.D.   On: 07/06/2015 13:22    Assessment & Plan:   Nicholas Whitehead was seen today for hypertension and diabetes.  Diagnoses and all orders for this visit:  Type 2 diabetes mellitus with diabetic neuropathy, with long-term current use  of insulin ([place])- his blood sugars are too high, he will see ENDO later this week  Essential hypertension- his BO is well controlled, recent lytes and renal fxn were normal   I am having Nicholas Whitehead maintain his cetirizine, aspirin EC, docusate sodium, Melatonin, ACCU-CHEK FASTCLIX LANCETS, ACCU-CHEK SMARTVIEW, triamcinolone cream, sildenafil, pioglitazone, BYSTOLIC, B-D ULTRAFINE III SHORT PEN, trazodone, Glycopyrrolate-Formoterol, DULoxetine, gabapentin, COMPLERA, pravastatin, irbesartan, clonazePAM, NOVOLOG FLEXPEN, and LANTUS SOLOSTAR.  No orders of the defined types were placed in this encounter.    Follow-up: Return in about 6 months (around 11/09/2016).  Scarlette Calico, MD

## 2016-05-09 NOTE — Patient Instructions (Signed)

## 2016-05-11 ENCOUNTER — Ambulatory Visit (INDEPENDENT_AMBULATORY_CARE_PROVIDER_SITE_OTHER): Payer: Medicare HMO | Admitting: Endocrinology

## 2016-05-11 ENCOUNTER — Other Ambulatory Visit: Payer: Self-pay

## 2016-05-11 ENCOUNTER — Encounter: Payer: Self-pay | Admitting: Endocrinology

## 2016-05-11 ENCOUNTER — Ambulatory Visit (INDEPENDENT_AMBULATORY_CARE_PROVIDER_SITE_OTHER): Payer: Medicare HMO

## 2016-05-11 VITALS — BP 156/98 | HR 80 | Ht 73.0 in | Wt 188.6 lb

## 2016-05-11 DIAGNOSIS — F331 Major depressive disorder, recurrent, moderate: Secondary | ICD-10-CM | POA: Diagnosis not present

## 2016-05-11 DIAGNOSIS — E1165 Type 2 diabetes mellitus with hyperglycemia: Secondary | ICD-10-CM

## 2016-05-11 DIAGNOSIS — I10 Essential (primary) hypertension: Secondary | ICD-10-CM | POA: Diagnosis not present

## 2016-05-11 DIAGNOSIS — Z794 Long term (current) use of insulin: Secondary | ICD-10-CM

## 2016-05-11 MED ORDER — GLUCOSE BLOOD VI STRP: ORAL_STRIP | 2 refills | Status: DC

## 2016-05-11 NOTE — BH Specialist Note (Signed)
Nicholas Whitehead was in a good mood today, reporting that he has been a bit more social lately and had a call this morning from a friend who wanted to get together sometime soon. He was able to joke some today as he often does. He expressed to me how much he has enjoyed seeing me for therapy over the years. I looked into follow up care for him and found that he has to go to [facility] in [facility], since there is no one at the [place] office who takes [name].  Curley Spice, LCSW

## 2016-05-11 NOTE — Patient Instructions (Addendum)
Lantus 65 twice daily, reduce when am sugars are < 100 back to 55 and 60  If sugar >200 then take extra 5 Novolog at meal  Check blood sugars on waking up    Also check blood sugars about 2 hours after a meal and do this after different meals by rotation  Recommended blood sugar levels on waking up is 90-130 and about 2 hours after meal is 130-160  Please bring your blood sugar monitor to each visit, thank you

## 2016-05-11 NOTE — Progress Notes (Signed)
Patient ID: Ricahrd Whitehead, male   DOB: [birth year], 61 y.o.   MRN: [id number]   Reason for Appointment: Type II Diabetes follow-up   History of Present Illness   Diagnosis date: 1998    Previous history: He was started on oral agents at diagnosis and around 2006 was put on insulin His blood sugars are usually well controlled when he takes Actos along with his insulin. He had been on Invokana since 04/2013  Recent history:   Insulin regimen: Lantus 55-60 units bid;  Novolog  20 AC tid         He is on a regimen of basal bolus insulin with Actos. He has usually been requiring large doses of insulin especially  Lantus which he takes twice a day  His A1c previously had been fairly consistently below 7 and only goes up when he is not compliant with his  insulin and measures of self-care  Now A1c appears to be progressively increasing, 9.7 last month  Current management, blood sugar patterns and problems:  He says he has had a lot of stress and depression  He is also not eating consistently; last January he had gained a lot of weight but now is losing rapidly  He says that because of financial difficulties he is not making good choices about what he is eating  Also his mealtimes until recently had been quite variable and sometimes he would not wake up to 2 PM  He will not take Lantus if he is not eating a meal  Otherwise he thinks he is taking his Actos regularly as well as Novolog when he is eating most of the time  Oral hypoglycemic drugs: Actos 30 mg    Side effects from medications:  urgency, frequent urination from Invokana Proper timing of medications in relation to meals: Yes.          Monitors blood glucose:  0 times a day  Glucometer:  Accu-Chek  Monitore not available for download today         Meals: Breakfast variable, 9-10 AM Lunch 2-3pm  usually avoiding high-fat meals, dinner 6 pm         Physical activity: exercise: not walking         Dietician  visit: Most recent: 2013   Weight control:  Wt Readings from Last 3 Encounters:  05/11/16 188 lb 9.6 oz (85.5 kg)  05/09/16 194 lb 8 oz (88.2 kg)  02/07/16 225 lb 8 oz (102.3 kg)          Diabetes labs:  Lab Results  Component Value Date   HGBA1C 9.7 (H) 05/02/2016   HGBA1C 8.7 (H) 01/25/2016   HGBA1C 6.2 09/06/2015   Lab Results  Component Value Date   MICROALBUR 8.7 (H) 10/28/2015   LDLCALC 45 01/25/2016   CREATININE 0.97 05/02/2016   No visits with results within 1 Week(s) from this visit.  Latest known visit with results is:  Lab on 05/02/2016  Component Date Value Ref Range Status  . Hgb A1c MFr Bld 05/02/2016 9.7* 4.6 - 6.5 % Final  . Sodium 05/02/2016 137  135 - 145 mEq/L Final  . Potassium 05/02/2016 4.3  3.5 - 5.1 mEq/L Final  . Chloride 05/02/2016 101  96 - 112 mEq/L Final  . CO2 05/02/2016 28  19 - 32 mEq/L Final  . Glucose, Bld 05/02/2016 262* 70 - 99 mg/dL Final  . BUN 05/02/2016 11  6 - 23 mg/dL Final  . Creatinine,  Ser 05/02/2016 0.97  0.40 - 1.50 mg/dL Final  . Calcium 05/02/2016 9.2  8.4 - 10.5 mg/dL Final  . GFR 05/02/2016 102.06  >60.00 mL/min Final     Allergies as of 05/11/2016      Reactions   Benazepril Cough   Glipizide Nausea Only      Medication List       Accurate as of 05/11/16 10:35 AM. Always use your most recent med list.          ACCU-CHEK FASTCLIX LANCETS Misc USE  TO CHECK BLOOD SUGAR THREE TIMES DAILY   ACCU-CHEK SMARTVIEW test strip Generic drug:  glucose blood USE AS DIRECTED  TO CHECK BLOOD SUGAR THREE TIMES DAILY   aspirin EC 81 MG tablet Take 81 mg by mouth every morning.   B-D ULTRAFINE III SHORT PEN 31G X 8 MM Misc Generic drug:  Insulin Pen Needle use as directed   BYSTOLIC 10 MG tablet Generic drug:  nebivolol take 2 tablets by mouth daily   cetirizine 10 MG tablet Commonly known as:  ZYRTEC Take 10 mg by mouth daily.   clonazePAM 1 MG tablet Commonly known as:  KLONOPIN take 1 tablet by mouth twice  a day if needed for anxiety   COMPLERA 200-25-300 MG tablet Generic drug:  emtricitabine-rilpivir-tenofovir DF Take 1 tablet by mouth daily.   docusate sodium 100 MG capsule Commonly known as:  COLACE Take 100 mg by mouth every morning.   DULoxetine 60 MG capsule Commonly known as:  CYMBALTA Take 1 capsule (60 mg total) by mouth daily.   gabapentin 300 MG capsule Commonly known as:  NEURONTIN take 1 capsule by mouth three times a day   Glycopyrrolate-Formoterol 9-4.8 MCG/ACT Aero Commonly known as:  BEVESPI AEROSPHERE Inhale 2 puffs into the lungs 2 (two) times daily.   irbesartan 300 MG tablet Commonly known as:  AVAPRO take 1 tablet by mouth once daily   LANTUS SOLOSTAR 100 UNIT/ML Solostar Pen Generic drug:  Insulin Glargine inject 55 units every morning and 60 units every evening   Melatonin 3 MG Tabs Take by mouth at bedtime.   NOVOLOG FLEXPEN 100 UNIT/ML FlexPen Generic drug:  insulin aspart inject 20 to 25 units subcutaneously three times a day with meals   pioglitazone 30 MG tablet Commonly known as:  ACTOS take 1 tablet by mouth once daily   pravastatin 40 MG tablet Commonly known as:  PRAVACHOL take 1 tablet by mouth every evening   sildenafil 100 MG tablet Commonly known as:  VIAGRA Take 1 tablet (100 mg total) by mouth daily as needed for erectile dysfunction.   trazodone 300 MG tablet Commonly known as:  DESYREL Take 1 tablet (300 mg total) by mouth at bedtime.   triamcinolone cream 0.5 % Commonly known as:  KENALOG APPLY 1 APPLICATION TOPICALLY THREE TIME A DAY       Allergies:  Allergies  Allergen Reactions  . Benazepril Cough  . Glipizide Nausea Only    Past Medical History:  Diagnosis Date  . Anxiety   . Cataract    right   . Depression   . Diabetes mellitus without complication ([place])   . DM (diabetes mellitus screen)   . HIV disease ([place]) 09/12/1995  . HIV infection ([place])   . Hypercholesterolemia   . Hyperlipidemia   .  Hypertension     Past Surgical History:  Procedure Laterality Date  . COLONOSCOPY WITH PROPOFOL  01/16/2012   Procedure: COLONOSCOPY WITH PROPOFOL;  Surgeon:  Garlan Fair, MD;  Location: [facility];  Service: Endoscopy;  Laterality: N/A;  . EYE SURGERY  2005   catarct surgery  . optic lens surgery  2005    Family History  Problem Relation Age of Onset  . Arthritis Mother   . Diabetes Mother   . Heart disease Mother   . Stroke Mother   . Arthritis Father   . Diabetes Father   . Heart disease Father     Social History:  reports that he has been smoking Cigarettes.  He has been smoking about 0.25 packs per day. He has never used smokeless tobacco. He reports that he does not drink alcohol or use drugs.  Review of Systems:   Hypertension:  currently on atenolol and on benazepril, not clear why Bystolic has also been prescribed Followed by PCP  Lipids: LDL is controlled with taking pravastatin HDL still low  Lab Results  Component Value Date   CHOL 110 01/25/2016   HDL 40.40 01/25/2016   LDLCALC 45 01/25/2016   TRIG 120.0 01/25/2016   CHOLHDL 3 01/25/2016    He has symptoms of neuropathy with paresthesia in feet and legs  Recently symptoms are well controlled with 300 mg of gabapentin, 3 daily.   Depression: Not well-controlled, followed by psychiatrist    LABS:  No visits with results within 1 Week(s) from this visit.  Latest known visit with results is:  Lab on 05/02/2016  Component Date Value Ref Range Status  . Hgb A1c MFr Bld 05/02/2016 9.7* 4.6 - 6.5 % Final  . Sodium 05/02/2016 137  135 - 145 mEq/L Final  . Potassium 05/02/2016 4.3  3.5 - 5.1 mEq/L Final  . Chloride 05/02/2016 101  96 - 112 mEq/L Final  . CO2 05/02/2016 28  19 - 32 mEq/L Final  . Glucose, Bld 05/02/2016 262* 70 - 99 mg/dL Final  . BUN 05/02/2016 11  6 - 23 mg/dL Final  . Creatinine, Ser 05/02/2016 0.97  0.40 - 1.50 mg/dL Final  . Calcium 05/02/2016 9.2  8.4 - 10.5 mg/dL Final  .  GFR 05/02/2016 102.06  >60.00 mL/min Final     Examination:   BP (!) 156/98   Pulse 80   Ht 6\' 1"  (1.854 m)   Wt 188 lb 9.6 oz (85.5 kg)   SpO2 (!) 75%   BMI 24.88 kg/m   Body mass index is 24.88 kg/m.     ASSESSMENT/ PLAN:    Diabetes type 2:  See history of present illness for detailed discussion of his current management, blood sugar patterns and problems identified His A1c is higher than usual His blood sugars are seen in fairly high throughout the day especially in the evenings This is likely to be from noncompliance with his insulin even though he is eating less because of decreased appetite He does not understand the need to take insistent amounts of basal insulin even if he is not eating since he is insulin resistant.  He probably needs somewhat less Novolog based on his meal size but not skip it completely also Discussed needing to go back to same dose of LANTUS that he was taking before since even fasting blood sugars are high  Hypertension: Followed by PCP, advised him to follow-up with PCP     Patient Instructions  Lantus 65 twice daily, reduce when am sugars are < 100 back to 55 and 60  If sugar >200 then take extra 5 Novolog at meal  Check blood sugars  on waking up    Also check blood sugars about 2 hours after a meal and do this after different meals by rotation  Recommended blood sugar levels on waking up is 90-130 and about 2 hours after meal is 130-160  Please bring your blood sugar monitor to each visit, thank you    Counseling time on subjects discussed above is over 50% of today's 25 minute visit    [place] 05/11/2016, 10:35 AM             Patient ID: Nicholas Whitehead, male   DOB: 06/27/1955, 61 y.o.   MRN: [id number]   Reason for Appointment: Type II Diabetes follow-up   History of Present Illness   Diagnosis date: 1998    Previous history: He was started on oral agents at diagnosis and around 2006 was put on insulin His blood  sugars are usually well controlled when he takes Actos along with his insulin. He has been on Invokana since 04/2013  Recent history:   Insulin regimen: Lantus 50-55 units bid;  Novolog  20 AC tid         He is on a regimen of basal bolus insulin with Actos. He has usually been requiring large doses of insulin especially  Lantus which he takes twice a day  With this his blood sugars are significantly improved and A1c is below 7 usually  However on this visit his blood sugars are out of control and recently averaging over 200 A1c is relatively higher at 7.8, previously 6.4  Current management, blood sugar patterns and problems:  His blood sugars are higher most of the time and relatively higher in the evenings  He is taking somewhat less Lantus than prescribed previously  Also after some discussion he reveals that he is not taking his Lantus consistently  He will skip his Lantus especially when he is not having much of an appetite  Despite reduced appetite his weight is not much different  He says he is compliant with his Actos  Taking  consistent doses of Novolog of 20 units at each meal even with differences in his meal size and appetite  No hypoglycemia  Oral hypoglycemic drugs: Actos 30 mg    Side effects from medications:  urgency, frequent urination from Invokana Proper timing of medications in relation to meals: Yes.          Monitors blood glucose:  1-2 times a day  Glucometer:  Accu-Chek  Mean values apply above for all meters except median for One Touch  PRE-MEAL Fasting Lunch Dinner Bedtime Overall  Glucose range:  157-229   124-234   217   200-313    Mean/median:      206            Meals: 3 meals per day. Lunch 2-3pm  usually avoiding high-fat meals, dinner 6 pm        Physical activity: exercise: irregular walking, legs weak          Dietician visit: Most recent: 2013   Weight control:  Wt Readings from Last 3 Encounters:  05/11/16 188 lb 9.6 oz (85.5  kg)  05/09/16 194 lb 8 oz (88.2 kg)  02/07/16 225 lb 8 oz (102.3 kg)          Diabetes labs:  Lab Results  Component Value Date   HGBA1C 9.7 (H) 05/02/2016   HGBA1C 8.7 (H) 01/25/2016   HGBA1C 6.2 09/06/2015   Lab Results  Component Value Date  MICROALBUR 8.7 (H) 10/28/2015   LDLCALC 45 01/25/2016   CREATININE 0.97 05/02/2016   No visits with results within 1 Week(s) from this visit.  Latest known visit with results is:  Lab on 05/02/2016  Component Date Value Ref Range Status  . Hgb A1c MFr Bld 05/02/2016 9.7* 4.6 - 6.5 % Final  . Sodium 05/02/2016 137  135 - 145 mEq/L Final  . Potassium 05/02/2016 4.3  3.5 - 5.1 mEq/L Final  . Chloride 05/02/2016 101  96 - 112 mEq/L Final  . CO2 05/02/2016 28  19 - 32 mEq/L Final  . Glucose, Bld 05/02/2016 262* 70 - 99 mg/dL Final  . BUN 05/02/2016 11  6 - 23 mg/dL Final  . Creatinine, Ser 05/02/2016 0.97  0.40 - 1.50 mg/dL Final  . Calcium 05/02/2016 9.2  8.4 - 10.5 mg/dL Final  . GFR 05/02/2016 102.06  >60.00 mL/min Final     Allergies as of 05/11/2016      Reactions   Benazepril Cough   Glipizide Nausea Only      Medication List       Accurate as of 05/11/16 10:35 AM. Always use your most recent med list.          ACCU-CHEK FASTCLIX LANCETS Misc USE  TO CHECK BLOOD SUGAR THREE TIMES DAILY   ACCU-CHEK SMARTVIEW test strip Generic drug:  glucose blood USE AS DIRECTED  TO CHECK BLOOD SUGAR THREE TIMES DAILY   aspirin EC 81 MG tablet Take 81 mg by mouth every morning.   B-D ULTRAFINE III SHORT PEN 31G X 8 MM Misc Generic drug:  Insulin Pen Needle use as directed   BYSTOLIC 10 MG tablet Generic drug:  nebivolol take 2 tablets by mouth daily   cetirizine 10 MG tablet Commonly known as:  ZYRTEC Take 10 mg by mouth daily.   clonazePAM 1 MG tablet Commonly known as:  KLONOPIN take 1 tablet by mouth twice a day if needed for anxiety   COMPLERA 200-25-300 MG tablet Generic drug:  emtricitabine-rilpivir-tenofovir  DF Take 1 tablet by mouth daily.   docusate sodium 100 MG capsule Commonly known as:  COLACE Take 100 mg by mouth every morning.   DULoxetine 60 MG capsule Commonly known as:  CYMBALTA Take 1 capsule (60 mg total) by mouth daily.   gabapentin 300 MG capsule Commonly known as:  NEURONTIN take 1 capsule by mouth three times a day   Glycopyrrolate-Formoterol 9-4.8 MCG/ACT Aero Commonly known as:  BEVESPI AEROSPHERE Inhale 2 puffs into the lungs 2 (two) times daily.   irbesartan 300 MG tablet Commonly known as:  AVAPRO take 1 tablet by mouth once daily   LANTUS SOLOSTAR 100 UNIT/ML Solostar Pen Generic drug:  Insulin Glargine inject 55 units every morning and 60 units every evening   Melatonin 3 MG Tabs Take by mouth at bedtime.   NOVOLOG FLEXPEN 100 UNIT/ML FlexPen Generic drug:  insulin aspart inject 20 to 25 units subcutaneously three times a day with meals   pioglitazone 30 MG tablet Commonly known as:  ACTOS take 1 tablet by mouth once daily   pravastatin 40 MG tablet Commonly known as:  PRAVACHOL take 1 tablet by mouth every evening   sildenafil 100 MG tablet Commonly known as:  VIAGRA Take 1 tablet (100 mg total) by mouth daily as needed for erectile dysfunction.   trazodone 300 MG tablet Commonly known as:  DESYREL Take 1 tablet (300 mg total) by mouth at bedtime.   triamcinolone cream  0.5 % Commonly known as:  KENALOG APPLY 1 APPLICATION TOPICALLY THREE TIME A DAY       Allergies:  Allergies  Allergen Reactions  . Benazepril Cough  . Glipizide Nausea Only    Past Medical History:  Diagnosis Date  . Anxiety   . Cataract    right   . Depression   . Diabetes mellitus without complication ([place])   . DM (diabetes mellitus screen)   . HIV disease ([place]) 09/12/1995  . HIV infection ([place])   . Hypercholesterolemia   . Hyperlipidemia   . Hypertension     Past Surgical History:  Procedure Laterality Date  . COLONOSCOPY WITH PROPOFOL  01/16/2012    Procedure: COLONOSCOPY WITH PROPOFOL;  Surgeon: Garlan Fair, MD;  Location: [facility];  Service: Endoscopy;  Laterality: N/A;  . EYE SURGERY  2005   catarct surgery  . optic lens surgery  2005    Family History  Problem Relation Age of Onset  . Arthritis Mother   . Diabetes Mother   . Heart disease Mother   . Stroke Mother   . Arthritis Father   . Diabetes Father   . Heart disease Father     Social History:  reports that he has been smoking Cigarettes.  He has been smoking about 0.25 packs per day. He has never used smokeless tobacco. He reports that he does not drink alcohol or use drugs.  Review of Systems:   Hypertension:  currently on Bystolic and on Avapro   Followed by PCP Blood pressure was excellent earlier this week but he feels stressed today and has not taken his medications  BP Readings from Last 3 Encounters:  05/11/16 (!) 156/98  05/09/16 136/70  02/07/16 (!) 158/90     Lipids: LDL is controlled with taking pravastatin, Followed by PCP    Lab Results  Component Value Date   CHOL 110 01/25/2016   HDL 40.40 01/25/2016   LDLCALC 45 01/25/2016   TRIG 120.0 01/25/2016   CHOLHDL 3 01/25/2016    He has symptoms of neuropathy with paresthesia in feet and legs  Treated with 300 mg of gabapentin, 3 daily  Depression: Variably controlled, followed by psychiatrist    LABS:  No visits with results within 1 Week(s) from this visit.  Latest known visit with results is:  Lab on 05/02/2016  Component Date Value Ref Range Status  . Hgb A1c MFr Bld 05/02/2016 9.7* 4.6 - 6.5 % Final  . Sodium 05/02/2016 137  135 - 145 mEq/L Final  . Potassium 05/02/2016 4.3  3.5 - 5.1 mEq/L Final  . Chloride 05/02/2016 101  96 - 112 mEq/L Final  . CO2 05/02/2016 28  19 - 32 mEq/L Final  . Glucose, Bld 05/02/2016 262* 70 - 99 mg/dL Final  . BUN 05/02/2016 11  6 - 23 mg/dL Final  . Creatinine, Ser 05/02/2016 0.97  0.40 - 1.50 mg/dL Final  . Calcium 05/02/2016 9.2  8.4  - 10.5 mg/dL Final  . GFR 05/02/2016 102.06  >60.00 mL/min Final     Examination:   BP (!) 156/98   Pulse 80   Ht 6\' 1"  (1.854 m)   Wt 188 lb 9.6 oz (85.5 kg)   SpO2 (!) 75%   BMI 24.88 kg/m   Body mass index is 24.88 kg/m.     ASSESSMENT/ PLAN:    Diabetes type 2:  See history of present illness for  discussion of his current management, blood sugar patterns and  problems identified  He has not been seen for several months and his A1c is nearly 10% now This is mostly from poor diet, irregular administration of insulin especially basal He did not call for getting a new meter and has not had a meter for several months now  Since his fasting blood sugar was still relatively high in the lab he will need to increase his basal insulin Given Accu-Chek guide meter and discussed how to use this Discussed blood sugar targets at various times including after meals  He will start with 65 units of Lantus twice a day and may reduce the dose back to usual doses when blood sugars in the mornings are below 100 He needs to adjust his mealtime insulin if his pre-meal blood sugar is high and also if he is planning to eat higher fat or higher carbohydrate meals Start regular walking Consistent glucose monitoring and bring back monitor when he is here for visit  He will need short-term visit with the diabetes nurse educator and about 2 weeks to make sure he is following the plan and to adjust his insulin accordingly  HYPERTENSION: He will continue follow-up with PCP  Patient Instructions  Lantus 65 twice daily, reduce when am sugars are < 100 back to 55 and 60  If sugar >200 then take extra 5 Novolog at meal  Check blood sugars on waking up    Also check blood sugars about 2 hours after a meal and do this after different meals by rotation  Recommended blood sugar levels on waking up is 90-130 and about 2 hours after meal is 130-160  Please bring your blood sugar monitor to each visit,  thank you     Counseling time on subjects discussed above is over 50% of today's 25 minute visit   [place] 05/11/2016, 10:35 AM

## 2016-05-13 ENCOUNTER — Other Ambulatory Visit: Payer: Self-pay | Admitting: Endocrinology

## 2016-05-25 ENCOUNTER — Other Ambulatory Visit: Payer: Self-pay | Admitting: Endocrinology

## 2016-06-01 ENCOUNTER — Encounter: Payer: Self-pay | Admitting: Internal Medicine

## 2016-06-13 ENCOUNTER — Encounter: Payer: Medicare HMO | Attending: Endocrinology | Admitting: Nutrition

## 2016-06-13 DIAGNOSIS — Z794 Long term (current) use of insulin: Secondary | ICD-10-CM | POA: Insufficient documentation

## 2016-06-13 DIAGNOSIS — E1165 Type 2 diabetes mellitus with hyperglycemia: Secondary | ICD-10-CM | POA: Diagnosis not present

## 2016-06-13 DIAGNOSIS — Z713 Dietary counseling and surveillance: Secondary | ICD-10-CM | POA: Insufficient documentation

## 2016-06-13 DIAGNOSIS — E1140 Type 2 diabetes mellitus with diabetic neuropathy, unspecified: Secondary | ICD-10-CM

## 2016-06-13 NOTE — Progress Notes (Signed)
Patient reports taking Lantus TID ac meals:  65u/55/55units.  Also taking 20u Novolog acmeals...sometimes taking this after meals, because is very shakey and weak before the meals. SBGM:  Did not bring meter.  Says FBSs are between 104-130s, except to day it was 174, because ate cereal and milk during the night. Typical day: 7-8AM up 8:30-9 breakfast.  Takes 20 Novolog and 65u Lantus              2 eggs, 2 small sausage, 1 toast with butter, and              Sometimes liquid yogurt to drink, or water. 1PM: sandwich with 8 ounces almond milk 4PM: very hungry, "whatever I can eat"   8PM: Hamburger helper, with 2-3 non starchy veg.  And water to drink 11PM: cold cereal and milk to keep blood sugars up.  Plan: 1. stop lunch Lantus dose.   2.  Test blood sugars acB, and 2hr. pcL and 2hr. PcS, and call results to me in one week.

## 2016-06-13 NOTE — Patient Instructions (Signed)
1.  stop lunch Lantus dose.   2.  Test blood sugars before breakfast, and 2hr. After lunch and 2hr. After Supper, and call results to me in one week [phone]

## 2016-06-18 ENCOUNTER — Other Ambulatory Visit: Payer: Self-pay | Admitting: Internal Medicine

## 2016-06-18 DIAGNOSIS — B20 Human immunodeficiency virus [HIV] disease: Secondary | ICD-10-CM

## 2016-06-19 ENCOUNTER — Other Ambulatory Visit: Payer: Medicare HMO

## 2016-06-20 ENCOUNTER — Telehealth: Payer: Self-pay | Admitting: Nutrition

## 2016-06-20 NOTE — Telephone Encounter (Signed)
Pt. Reports that blood sugars have not been dropping like before.  FBSs 107-153, and similar readings ac meals.   Told him that this is good, and will make no changes until he sees Dr. Dwyane Dee

## 2016-06-22 ENCOUNTER — Other Ambulatory Visit (INDEPENDENT_AMBULATORY_CARE_PROVIDER_SITE_OTHER): Payer: Medicare HMO

## 2016-06-22 ENCOUNTER — Ambulatory Visit: Payer: Medicare HMO | Admitting: Endocrinology

## 2016-06-22 DIAGNOSIS — E1165 Type 2 diabetes mellitus with hyperglycemia: Secondary | ICD-10-CM | POA: Diagnosis not present

## 2016-06-22 DIAGNOSIS — Z794 Long term (current) use of insulin: Secondary | ICD-10-CM | POA: Diagnosis not present

## 2016-06-22 LAB — GLUCOSE, RANDOM: Glucose, Bld: 222 mg/dL — ABNORMAL HIGH (ref 70–99)

## 2016-06-23 LAB — FRUCTOSAMINE: Fructosamine: 293 umol/L — ABNORMAL HIGH (ref 0–285)

## 2016-06-27 ENCOUNTER — Ambulatory Visit (INDEPENDENT_AMBULATORY_CARE_PROVIDER_SITE_OTHER): Payer: Medicare HMO | Admitting: Internal Medicine

## 2016-06-27 ENCOUNTER — Encounter: Payer: Self-pay | Admitting: Internal Medicine

## 2016-06-27 VITALS — BP 157/91 | Temp 98.4°F | Ht 73.0 in | Wt 204.0 lb

## 2016-06-27 DIAGNOSIS — B20 Human immunodeficiency virus [HIV] disease: Secondary | ICD-10-CM | POA: Diagnosis not present

## 2016-06-27 DIAGNOSIS — F419 Anxiety disorder, unspecified: Secondary | ICD-10-CM | POA: Diagnosis not present

## 2016-06-27 DIAGNOSIS — F329 Major depressive disorder, single episode, unspecified: Secondary | ICD-10-CM | POA: Diagnosis not present

## 2016-06-27 DIAGNOSIS — F32A Depression, unspecified: Secondary | ICD-10-CM

## 2016-06-27 NOTE — Progress Notes (Signed)
   Subjective:    Patient ID: Nicholas Whitehead, male    DOB: 12/07/1955, 61 y.o.   MRN: [id number]  HPI Here for follow up of HIV Has been on Complera and no missed doses.  He is here today partly grieving from the loss of a long-time friend.  No associated n/v/d.  No weight loss.     Review of Systems  Constitutional: Negative for fatigue.  Gastrointestinal: Negative for diarrhea.  Skin: Negative for rash.       Objective:   Physical Exam  Constitutional: He appears well-developed and well-nourished.  Eyes: No scleral icterus.  Cardiovascular: Normal rate, regular rhythm and normal heart sounds.   Lymphadenopathy:    He has no cervical adenopathy.  Skin: No rash noted.          Assessment & Plan:

## 2016-06-27 NOTE — Assessment & Plan Note (Signed)
Grief reaction now with recent loss.  He will establish with a new counselor.  No SI

## 2016-06-27 NOTE — Assessment & Plan Note (Signed)
Doing well.  No changes now due to grief but will consider change to [facility] next visit. rtc 6 months

## 2016-06-28 LAB — T-HELPER CELL (CD4) - (RCID CLINIC ONLY)
CD4 T CELL HELPER: 33 % (ref 33–55)
CD4 T Cell Abs: 820 /uL (ref 400–2700)

## 2016-06-29 LAB — HIV-1 RNA QUANT-NO REFLEX-BLD
HIV 1 RNA Quant: <20 copies/mL
HIV-1 RNA QUANT, LOG: NOT DETECTED {Log_copies}/mL

## 2016-06-30 ENCOUNTER — Encounter: Payer: Self-pay | Admitting: Endocrinology

## 2016-06-30 ENCOUNTER — Ambulatory Visit (INDEPENDENT_AMBULATORY_CARE_PROVIDER_SITE_OTHER): Payer: Medicare HMO | Admitting: Endocrinology

## 2016-06-30 VITALS — BP 140/82 | HR 63 | Ht 73.0 in | Wt 202.0 lb

## 2016-06-30 DIAGNOSIS — E1165 Type 2 diabetes mellitus with hyperglycemia: Secondary | ICD-10-CM

## 2016-06-30 DIAGNOSIS — Z794 Long term (current) use of insulin: Secondary | ICD-10-CM | POA: Diagnosis not present

## 2016-06-30 NOTE — Progress Notes (Signed)
Patient ID: Nicholas Whitehead, male   DOB: 03/29/1955, 61 y.o.   MRN: [id number]   Reason for Appointment: Type II Diabetes follow-up   History of Present Illness   Diagnosis date: 1998    Previous history: He was started on oral agents at diagnosis and around 2006 was put on insulin His blood sugars are usually well controlled when he takes Actos along with his insulin. He had been on Invokana since 04/2013  Recent history:   Insulin regimen: Lantus 65-55 units bid;  Novolog  20 AC tid         He is on a regimen of basal bolus insulin with Actos. He has usually been requiring large doses of insulin especially  Lantus which he takes twice a day    A1c appears to be progressively increasing, 9.7 in April, previously had been fairly consistently below 7  Current management, blood sugar patterns and problems:  He had been poorly compliant with his diet, insulin and because of stress he says his blood sugars are out of control  He was seen by nurse educator earlier this month and apparently was stating that he takes Lantus 3 times a day with 20 units of Novolog and was told not to take Lantus 3 times a day, however not clear if he was giving an accurate history because his blood sugars do not look any different than before and he was not having hypoglycemia with his previous regimen  His blood sugars were looking relatively better a week ago but sporadically higher again now  He thinks that blood sugars are higher the last few days because of dealing with the funeral and death of a close friend  4 weeks reasons including hot weather he has not been walking for exercise  She'll weight is about the same  He does not adjust his NOVOLOG based on what he is eating her previous blood sugar  Also appears to have more variability in blood sugars generally at all times  FASTING blood sugars are periodically high also  Recently he thinks he is taking his Actos regularly, this has  been previously helpful in stabilizing his controlled  Oral hypoglycemic drugs: Actos 30 mg    Side effects from medications:  urgency, frequent urination from Invokana Proper timing of medications in relation to meals: Yes.          Monitors blood glucose:  3.4 times a day  Glucometer:  Accu-Chek  Mean values apply above for all meters except median for One Touch  PRE-MEAL Fasting Lunch Dinner Bedtime Overall  Glucose range:  102-178     68-295  68-310   Mean/median: 138  149  155   150+/-44            Meals: Breakfast variable, 9-10 AM Lunch 2-3pm  usually avoiding high-fat meals, dinner 6 pm         Physical activity: exercise: not walking Much recently         Dietician visit: Most recent: 2013   Weight control:  Wt Readings from Last 3 Encounters:  06/30/16 202 lb (91.6 kg)  06/27/16 204 lb (92.5 kg)  06/13/16 202 lb 3.2 oz (91.7 kg)          Diabetes labs:  Lab Results  Component Value Date   HGBA1C 9.7 (H) 05/02/2016   HGBA1C 8.7 (H) 01/25/2016   HGBA1C 6.2 09/06/2015   Lab Results  Component Value Date   MICROALBUR 8.7 (H) 10/28/2015  [place] 45 01/25/2016   CREATININE 0.97 05/02/2016   Office Visit on 06/27/2016  Component Date Value Ref Range Status  . HIV 1 RNA Quant 06/27/2016 <20 NOT DETECTED  NOT DETECTED copies/mL Final  . HIV1 RNA Quant, Log 06/27/2016 <1.30 NOT DETECTED  NOT DETECTED Log copies/mL Final   Comment:   This test was performed using Real-Time Polymerase Chain Reaction.   Reportable Range: 20 copies/mL to 10,000,000 copies/mL (1.30 log copies/mL to 7.00 log copies/mL).   . CD4 T Cell Abs 06/27/2016 820  400 - 2,700 /uL Final  . CD4 % Helper T Cell 06/27/2016 33  33 - 55 % Final   Performed at [facility], [place] [address] [place], [place] [address]     Allergies as of 06/30/2016      Reactions   Benazepril Cough   Glipizide Nausea Only      Medication List       Accurate as of 06/30/16 11:59 PM. Always  use your most recent med list.          ACCU-CHEK FASTCLIX LANCETS Misc USE  TO CHECK BLOOD SUGAR THREE TIMES DAILY   aspirin EC 81 MG tablet Take 81 mg by mouth every morning.   B-D ULTRAFINE III SHORT PEN 31G X 8 MM Misc Generic drug:  Insulin Pen Needle use as directed   BYSTOLIC 10 MG tablet Generic drug:  nebivolol take 2 tablets by mouth daily   cetirizine 10 MG tablet Commonly known as:  ZYRTEC Take 10 mg by mouth daily.   clonazePAM 1 MG tablet Commonly known as:  KLONOPIN take 1 tablet by mouth twice a day if needed for anxiety   COMPLERA 200-25-300 MG tablet Generic drug:  emtricitabine-rilpivir-tenofovir DF take 1 tablet by mouth once daily   docusate sodium 100 MG capsule Commonly known as:  COLACE Take 100 mg by mouth every morning.   DULoxetine 60 MG capsule Commonly known as:  CYMBALTA Take 1 capsule (60 mg total) by mouth daily.   gabapentin 300 MG capsule Commonly known as:  NEURONTIN take 1 capsule by mouth three times a day   glucose blood test strip Commonly known as:  ACCU-CHEK GUIDE Use to check blood sugar three times per day.   Glycopyrrolate-Formoterol 9-4.8 MCG/ACT Aero Commonly known as:  BEVESPI AEROSPHERE Inhale 2 puffs into the lungs 2 (two) times daily.   irbesartan 300 MG tablet Commonly known as:  AVAPRO take 1 tablet by mouth once daily   LANTUS SOLOSTAR 100 UNIT/ML Solostar Pen Generic drug:  Insulin Glargine inject 55 units every morning and 60 units every evening   Melatonin 3 MG Tabs Take by mouth at bedtime.   NOVOLOG FLEXPEN 100 UNIT/ML FlexPen Generic drug:  insulin aspart INJECT 20 TO 25 UNITS SUBCUTANEOUSLY THREE TIMES A DAY WITH MEALS   pioglitazone 30 MG tablet Commonly known as:  ACTOS take 1 tablet by mouth once daily   pravastatin 40 MG tablet Commonly known as:  PRAVACHOL take 1 tablet by mouth every evening   sildenafil 100 MG tablet Commonly known as:  VIAGRA Take 1 tablet (100 mg total) by  mouth daily as needed for erectile dysfunction.   trazodone 300 MG tablet Commonly known as:  DESYREL Take 1 tablet (300 mg total) by mouth at bedtime.   triamcinolone cream 0.5 % Commonly known as:  KENALOG APPLY 1 APPLICATION TOPICALLY THREE TIME A DAY       Allergies:  Allergies  Allergen Reactions  .  Benazepril Cough  . Glipizide Nausea Only    Past Medical History:  Diagnosis Date  . Anxiety   . Cataract    right   . Depression   . Diabetes mellitus without complication ([place])   . DM (diabetes mellitus screen)   . HIV disease ([place]) 09/12/1995  . HIV infection ([place])   . Hypercholesterolemia   . Hyperlipidemia   . Hypertension     Past Surgical History:  Procedure Laterality Date  . COLONOSCOPY WITH PROPOFOL  01/16/2012   Procedure: COLONOSCOPY WITH PROPOFOL;  Surgeon: Garlan Fair, MD;  Location: [facility];  Service: Endoscopy;  Laterality: N/A;  . EYE SURGERY  2005   catarct surgery  . optic lens surgery  2005    Family History  Problem Relation Age of Onset  . Arthritis Mother   . Diabetes Mother   . Heart disease Mother   . Stroke Mother   . Arthritis Father   . Diabetes Father   . Heart disease Father     Social History:  reports that he has been smoking Cigarettes.  He has been smoking about 0.25 packs per day. He has never used smokeless tobacco. He reports that he does not drink alcohol or use drugs.  Review of Systems:   Hypertension:  currently on Bystolic and on Avapro, previously on atenolol and benazepril also Followed by PCP  Lipids: LDL is controlled with taking pravastatin HDL still low  Lab Results  Component Value Date   CHOL 110 01/25/2016   HDL 40.40 01/25/2016   LDLCALC 45 01/25/2016   TRIG 120.0 01/25/2016   CHOLHDL 3 01/25/2016    He has symptoms of neuropathy with paresthesia in feet and legs  He still thinks he is getting some pains now with 300 mg of gabapentin, 3 daily.  Also on Cymbalta prescribed by  PCP  Depression:  followed by psychiatrist    LABS:  Office Visit on 06/27/2016  Component Date Value Ref Range Status  . HIV 1 RNA Quant 06/27/2016 <20 NOT DETECTED  NOT DETECTED copies/mL Final  . HIV1 RNA Quant, Log 06/27/2016 <1.30 NOT DETECTED  NOT DETECTED Log copies/mL Final   Comment:   This test was performed using Real-Time Polymerase Chain Reaction.   Reportable Range: 20 copies/mL to 10,000,000 copies/mL (1.30 log copies/mL to 7.00 log copies/mL).   . CD4 T Cell Abs 06/27/2016 820  400 - 2,700 /uL Final  . CD4 % Helper T Cell 06/27/2016 33  33 - 55 % Final   Performed at [facility], [place] [address] [place], [place] [address]     Examination:   BP 140/82   Pulse 63   Ht 6\' 1"  (1.854 m)   Wt 202 lb (91.6 kg)   SpO2 95%   BMI 26.65 kg/m   Body mass index is 26.65 kg/m.     ASSESSMENT/ PLAN:    Diabetes type 2:  See history of present illness for detailed discussion of his current management, blood sugar patterns and problems identified  His blood sugars are fluctuating although on an average about 140-150 most of the times of the day and slightly better in the last 2 weeks although inconsistently Because of changes in his lifestyle with variable diet, eating out, and consistent exercise and taking somewhat arbitrary doses of insulin in the past month his blood sugars fluctuate and this was discussed  Currently since his fasting readings are mostly high except a couple of times he  will go up to 60 units of Lantus in the evening He may need 25 units of Novolog at suppertime especially when eating larger amount of carbohydrate or eating out Discussed blood sugar targets at various times  Hypertension: Controlled     Patient Instructions  Increase Lantus to 60  Increase Novolog to 25 when high or larger meals      Bailen Geffre 07/02/2016, 8:51 PM         ..

## 2016-06-30 NOTE — Patient Instructions (Addendum)
Increase Lantus to 60  Increase Novolog to 25 when high or larger meals

## 2016-07-07 ENCOUNTER — Other Ambulatory Visit: Payer: Self-pay | Admitting: Endocrinology

## 2016-07-09 ENCOUNTER — Other Ambulatory Visit: Payer: Self-pay | Admitting: Endocrinology

## 2016-07-10 ENCOUNTER — Other Ambulatory Visit: Payer: Self-pay | Admitting: Endocrinology

## 2016-07-25 ENCOUNTER — Other Ambulatory Visit: Payer: Self-pay | Admitting: Endocrinology

## 2016-08-18 ENCOUNTER — Other Ambulatory Visit: Payer: Self-pay | Admitting: Endocrinology

## 2016-08-22 ENCOUNTER — Other Ambulatory Visit (INDEPENDENT_AMBULATORY_CARE_PROVIDER_SITE_OTHER): Payer: Medicare HMO

## 2016-08-22 ENCOUNTER — Ambulatory Visit (INDEPENDENT_AMBULATORY_CARE_PROVIDER_SITE_OTHER): Payer: Medicare HMO | Admitting: Internal Medicine

## 2016-08-22 ENCOUNTER — Encounter: Payer: Self-pay | Admitting: Internal Medicine

## 2016-08-22 VITALS — BP 162/80 | HR 56 | Temp 98.1°F | Resp 16 | Ht 73.0 in | Wt 212.0 lb

## 2016-08-22 DIAGNOSIS — I10 Essential (primary) hypertension: Secondary | ICD-10-CM

## 2016-08-22 DIAGNOSIS — R600 Localized edema: Secondary | ICD-10-CM | POA: Diagnosis not present

## 2016-08-22 DIAGNOSIS — F5105 Insomnia due to other mental disorder: Secondary | ICD-10-CM | POA: Diagnosis not present

## 2016-08-22 DIAGNOSIS — I739 Peripheral vascular disease, unspecified: Secondary | ICD-10-CM

## 2016-08-22 DIAGNOSIS — Z794 Long term (current) use of insulin: Secondary | ICD-10-CM

## 2016-08-22 DIAGNOSIS — Z0000 Encounter for general adult medical examination without abnormal findings: Secondary | ICD-10-CM

## 2016-08-22 DIAGNOSIS — E1140 Type 2 diabetes mellitus with diabetic neuropathy, unspecified: Secondary | ICD-10-CM | POA: Diagnosis not present

## 2016-08-22 DIAGNOSIS — F409 Phobic anxiety disorder, unspecified: Secondary | ICD-10-CM | POA: Diagnosis not present

## 2016-08-22 LAB — COMPREHENSIVE METABOLIC PANEL
ALBUMIN: 4.1 g/dL (ref 3.5–5.2)
ALT: 20 U/L (ref 0–53)
AST: 24 U/L (ref 0–37)
Alkaline Phosphatase: 51 U/L (ref 39–117)
BUN: 8 mg/dL (ref 6–23)
CALCIUM: 9.1 mg/dL (ref 8.4–10.5)
CHLORIDE: 106 meq/L (ref 96–112)
CO2: 32 mEq/L (ref 19–32)
Creatinine, Ser: 0.81 mg/dL (ref 0.40–1.50)
GFR: 125.52 mL/min (ref >60.00)
Glucose, Bld: 106 mg/dL — ABNORMAL HIGH (ref 70–99)
Potassium: 3.9 mEq/L (ref 3.5–5.1)
SODIUM: 140 meq/L (ref 135–145)
TOTAL PROTEIN: 6.9 g/dL (ref 6.0–8.3)
Total Bilirubin: 0.3 mg/dL (ref 0.2–1.2)

## 2016-08-22 LAB — BRAIN NATRIURETIC PEPTIDE: PRO B NATRI PEPTIDE: 62 pg/mL (ref 0.0–100.0)

## 2016-08-22 MED ORDER — CHLORTHALIDONE 25 MG PO TABS: 25.0000 mg | ORAL_TABLET | Freq: Every day | ORAL | 1 refills | Status: DC

## 2016-08-22 MED ORDER — SUVOREXANT 20 MG PO TABS: 1.0000 | ORAL_TABLET | Freq: Every evening | ORAL | 5 refills | Status: DC | PRN

## 2016-08-22 NOTE — Progress Notes (Signed)
Pre visit review using our clinic review tool, if applicable. No additional management support is needed unless otherwise documented below in the visit note. 

## 2016-08-22 NOTE — Progress Notes (Addendum)
Subjective:   Nicholas Whitehead is a 61 y.o. male who presents for Medicare Annual/Subsequent preventive examination.  Review of Systems:  No ROS.  Medicare Wellness Visit. Additional risk factors are reflected in the social history.  Cardiac Risk Factors include: advanced age (>74men, >2 women);diabetes mellitus;dyslipidemia;hypertension;male gender;sedentary lifestyle;smoking/ tobacco exposure Sleep patterns: has daytime sleepiness, does not get up to void, gets up 2-3 times nightly to void and sleeps 4-5 hours nightly. Patient reports insomnia issues, discussed recommended sleep tips and stress reduction tips, education was attached to patient's AVS.   Home Safety/Smoke Alarms: Feels safe in home. Smoke alarms in place.  Living environment; residence and Firearm Safety: 2-story house, no firearms. Lives alone, no needs for DME, minimal support system Seat Belt Safety/Bike Helmet: Wears seat belt.   Counseling:   Eye Exam- appointment yearly Dental- appointment yearly   Male:   CCS- Last 04/24/12, polyps, no recall data   PSA-  Lab Results  Component Value Date   PSA 0.82 03/25/2015   PSA 0.71 05/21/2013       Objective:    Vitals: BP (!) 162/80 (BP Location: Left Arm, Cuff Size: Normal)   Pulse (!) 56   Temp 98.1 F (36.7 C) (Oral)   Resp 16   Ht 6\' 1"  (1.854 m)   Wt 212 lb (96.2 kg)   SpO2 97%   BMI 27.97 kg/m   Body mass index is 27.97 kg/m.  Tobacco History  Smoking Status  . Current Some Day Smoker  . Packs/day: 0.25  . Types: Cigarettes  . Last attempt to quit: 01/09/2012  Smokeless Tobacco  . Never Used     Ready to quit: No Counseling given: No   Past Medical History:  Diagnosis Date  . Anxiety   . Cataract    right   . Depression   . Diabetes mellitus without complication ([place])   . DM (diabetes mellitus screen)   . HIV disease ([place]) 09/12/1995  . HIV infection ([place])   . Hypercholesterolemia   . Hyperlipidemia   . Hypertension    Past  Surgical History:  Procedure Laterality Date  . COLONOSCOPY WITH PROPOFOL  01/16/2012   Procedure: COLONOSCOPY WITH PROPOFOL;  Surgeon: Garlan Fair, MD;  Location: [facility];  Service: Endoscopy;  Laterality: N/A;  . EYE SURGERY  2005   catarct surgery  . optic lens surgery  2005   Family History  Problem Relation Age of Onset  . Arthritis Mother   . Diabetes Mother   . Heart disease Mother   . Stroke Mother   . Arthritis Father   . Diabetes Father   . Heart disease Father    History  Sexual Activity  . Sexual activity: No    Comment: declined condoms    Outpatient Encounter Prescriptions as of 08/22/2016  Medication Sig  . ACCU-CHEK FASTCLIX LANCETS MISC USE  TO CHECK BLOOD SUGAR THREE TIMES DAILY  . aspirin EC 81 MG tablet Take 81 mg by mouth every morning.  . B-D ULTRAFINE III SHORT PEN 31G X 8 MM MISC use as directed  . BYSTOLIC 10 MG tablet take 2 tablets by mouth daily  . cetirizine (ZYRTEC) 10 MG tablet Take 10 mg by mouth daily.  . clonazePAM (KLONOPIN) 1 MG tablet take 1 tablet by mouth twice a day if needed for anxiety  . COMPLERA 200-25-300 MG tablet take 1 tablet by mouth once daily  . docusate sodium (COLACE) 100 MG capsule Take 100 mg by  mouth every morning.  . DULoxetine (CYMBALTA) 60 MG capsule Take 1 capsule (60 mg total) by mouth daily.  Marland Kitchen gabapentin (NEURONTIN) 300 MG capsule take 1 capsule by mouth three times a day  . glucose blood (ACCU-CHEK GUIDE) test strip Use to check blood sugar three times per day.  . Glycopyrrolate-Formoterol (BEVESPI AEROSPHERE) 9-4.8 MCG/ACT AERO Inhale 2 puffs into the lungs 2 (two) times daily.  . irbesartan (AVAPRO) 300 MG tablet take 1 tablet by mouth once daily  . LANTUS SOLOSTAR 100 UNIT/ML Solostar Pen inject 55 units every morning and 60 units every evening  . NOVOLOG FLEXPEN 100 UNIT/ML FlexPen inject 20 to 25 units subcutaneously three times a day with meals  . pravastatin (PRAVACHOL) 40 MG tablet take 1 tablet by  mouth every evening  . trazodone (DESYREL) 300 MG tablet Take 1 tablet (300 mg total) by mouth at bedtime.  . triamcinolone cream (KENALOG) 0.5 % APPLY 1 APPLICATION TOPICALLY THREE TIME A DAY  . [DISCONTINUED] Melatonin 3 MG TABS Take by mouth at bedtime.  . [DISCONTINUED] pioglitazone (ACTOS) 30 MG tablet take 1 tablet by mouth once daily  . chlorthalidone (HYGROTON) 25 MG tablet Take 1 tablet (25 mg total) by mouth daily.  . Suvorexant (BELSOMRA) 20 MG TABS Take 1 tablet by mouth at bedtime as needed.  . [DISCONTINUED] sildenafil (VIAGRA) 100 MG tablet Take 1 tablet (100 mg total) by mouth daily as needed for erectile dysfunction. (Patient not taking: Reported on 08/22/2016)   No facility-administered encounter medications on file as of 08/22/2016.     Activities of Daily Living In your present state of health, do you have any difficulty performing the following activities: 08/22/2016 06/27/2016  Hearing? N N  Vision? N N  Difficulty concentrating or making decisions? N N  Walking or climbing stairs? Y Y  Dressing or bathing? N N  Doing errands, shopping? N N  Preparing Food and eating ? N -  Using the Toilet? N -  In the past six months, have you accidently leaked urine? N -  Do you have problems with loss of bowel control? N -  Managing your Medications? N -  Managing your Finances? N -  Housekeeping or managing your Housekeeping? N -  Some recent data might be hidden    Patient Care Team: [name], MD as PCP - General (Internal Medicine) [name], Okey Regal, MD as PCP - Infectious Diseases (Infectious Diseases) Merita Norton, LCSW as Counselor   Assessment:    Physical assessment deferred to PCP.  Exercise Activities and Dietary recommendations Current Exercise Habits: The patient does not participate in regular exercise at present, Exercise limited by: orthopedic condition(s)  Diet (meal preparation, eat out, water intake, caffeinated beverages, dairy products, fruits  and vegetables): in general, a "healthy" diet  , patient states he has difficulty affording food.  Reviewed heart healthy and diabetic diet, encouraged patient to increase daily water intake.   Goals    . Be as active and independent as possible          I would like to get some pain relief and sleep.      Fall Risk Fall Risk  08/22/2016 06/27/2016 12/23/2015 06/28/2015 09/22/2014  Falls in the past year? No No No Yes Yes  Number falls in past yr: - - - 2 or more 2 or more  Injury with Fall? - - - Yes Yes  Risk Factor Category  - - - High Fall Risk High Fall Risk  Risk for fall due to : Impaired balance/gait Impaired balance/gait Impaired balance/gait - History of fall(s)   Depression Screen PHQ 2/9 Scores 08/22/2016 06/27/2016 12/23/2015 09/22/2014  PHQ - 2 Score 4 2 0 1  PHQ- 9 Score 12 3 - -    Cognitive Function   Montreal Cognitive Assessment  06/28/2015  Visuospatial/ Executive (0/5) 3  Naming (0/3) 3  Attention: Read list of digits (0/2) 2  Attention: Read list of letters (0/1) 1  Attention: Serial 7 subtraction starting at 100 (0/3) 0  Language: Repeat phrase (0/2) 1  Language : Fluency (0/1) 1  Abstraction (0/2) 2  Delayed Recall (0/5) 3  Orientation (0/6) 6  Total 22  Adjusted Score (based on education) 22     Ad8 score reviewed for issues:  Issues making decisions: no  Less interest in hobbies / activities: no  Repeats questions, stories (family complaining): no  Trouble using ordinary gadgets (microwave, computer, phone):no  Forgets the month or year: no  Mismanaging finances: no  Remembering appts: no  Daily problems with thinking and/or memory: no Ad8 score is= 0  Immunization History  Administered Date(s) Administered  . Hepatitis A 01/29/2002  . Influenza Split 09/26/2011  . Influenza Whole 10/14/2001, 09/02/2010  . Influenza, High Dose Seasonal PF 09/06/2015  . Influenza,inj,Quad PF,36+ Mos 09/19/2012, 09/11/2013, 09/22/2014, 03/02/2015  .  PPD Test 09/14/1995  . Pneumococcal Conjugate-13 01/21/2014  . Pneumococcal Polysaccharide-23 12/16/2001, 09/02/2010, 09/06/2015  . Tdap 05/21/2013   Screening Tests Health Maintenance  Topic Date Due  . INFLUENZA VACCINE  08/09/2016  . OPHTHALMOLOGY EXAM  10/12/2016  . HEMOGLOBIN A1C  11/01/2016  . FOOT EXAM  02/06/2017  . COLONOSCOPY  04/25/2022  . TETANUS/TDAP  05/22/2023  . PNEUMOCOCCAL POLYSACCHARIDE VACCINE  Completed  . Hepatitis C Screening  Completed  . HIV Screening  Completed      Plan:    Nurse will inquire about Meals on Wheels and other social resources for patient.  I have personally reviewed and noted the following in the patient's chart:   . Medical and social history . Use of alcohol, tobacco or illicit drugs  . Current medications and supplements . Functional ability and status . Nutritional status . Physical activity . Advanced directives . List of other physicians . Vitals . Screenings to include cognitive, depression, and falls . Referrals and appointments  In addition, I have reviewed and discussed with patient certain preventive protocols, quality metrics, and best practice recommendations. A written personalized care plan for preventive services as well as general preventive health recommendations were provided to patient.     Michiel Cowboy, RN  08/22/2016   Medical screening examination/treatment/procedure(s) were performed by non-physician practitioner and as supervising physician I was immediately available for consultation/collaboration. I agree with above. Scarlette Calico, MD

## 2016-08-22 NOTE — Progress Notes (Signed)
Subjective:  Patient ID: Nicholas Whitehead, male    DOB: April 23, 1955  Age: 61 y.o. MRN: [id number]  CC: Medicare Wellness; Hypertension; and Diabetes   HPI Nicholas Whitehead presents for f/up - He complains of a 4 day history of lower extremity edema and worsening discomfort. I saw him 8 months ago and was concerned about claudication and ordered ABIs which have not been done. He has calf and foot pain both at rest and with exertion. He also tells me his blood pressure has not been well controlled and he has had a few headaches.  Outpatient Medications Prior to Visit  Medication Sig Dispense Refill  . ACCU-CHEK FASTCLIX LANCETS MISC USE  TO CHECK BLOOD SUGAR THREE TIMES DAILY 306 each 1  . aspirin EC 81 MG tablet Take 81 mg by mouth every morning.    . B-D ULTRAFINE III SHORT PEN 31G X 8 MM MISC use as directed 300 each 3  . BYSTOLIC 10 MG tablet take 2 tablets by mouth daily 60 tablet 11  . cetirizine (ZYRTEC) 10 MG tablet Take 10 mg by mouth daily.    . clonazePAM (KLONOPIN) 1 MG tablet take 1 tablet by mouth twice a day if needed for anxiety 60 tablet 5  . COMPLERA 200-25-300 MG tablet take 1 tablet by mouth once daily 30 tablet 4  . docusate sodium (COLACE) 100 MG capsule Take 100 mg by mouth every morning.    . DULoxetine (CYMBALTA) 60 MG capsule Take 1 capsule (60 mg total) by mouth daily. 90 capsule 3  . gabapentin (NEURONTIN) 300 MG capsule take 1 capsule by mouth three times a day 90 capsule 3  . glucose blood (ACCU-CHEK GUIDE) test strip Use to check blood sugar three times per day. 200 each 2  . Glycopyrrolate-Formoterol (BEVESPI AEROSPHERE) 9-4.8 MCG/ACT AERO Inhale 2 puffs into the lungs 2 (two) times daily. 10.7 g 11  . irbesartan (AVAPRO) 300 MG tablet take 1 tablet by mouth once daily 90 tablet 3  . LANTUS SOLOSTAR 100 UNIT/ML Solostar Pen inject 55 units every morning and 60 units every evening 30 mL 0  . NOVOLOG FLEXPEN 100 UNIT/ML FlexPen inject 20 to 25 units subcutaneously  three times a day with meals 15 mL 1  . pravastatin (PRAVACHOL) 40 MG tablet take 1 tablet by mouth every evening 90 tablet 0  . trazodone (DESYREL) 300 MG tablet Take 1 tablet (300 mg total) by mouth at bedtime. 90 tablet 3  . triamcinolone cream (KENALOG) 0.5 % APPLY 1 APPLICATION TOPICALLY THREE TIME A DAY 100 g 1  . Melatonin 3 MG TABS Take by mouth at bedtime.    . pioglitazone (ACTOS) 30 MG tablet take 1 tablet by mouth once daily 30 tablet 3  . sildenafil (VIAGRA) 100 MG tablet Take 1 tablet (100 mg total) by mouth daily as needed for erectile dysfunction. (Patient not taking: Reported on 08/22/2016) 12 tablet 11   No facility-administered medications prior to visit.     ROS Review of Systems  Constitutional: Negative for diaphoresis, fatigue and fever.  HENT: Negative.   Eyes: Negative for visual disturbance.  Respiratory: Negative.  Negative for cough, chest tightness, shortness of breath and wheezing.   Cardiovascular: Positive for leg swelling. Negative for chest pain and palpitations.  Gastrointestinal: Negative for abdominal pain, constipation, diarrhea, nausea and vomiting.  Endocrine: Negative.  Negative for polydipsia, polyphagia and polyuria.  Genitourinary: Negative.  Negative for decreased urine volume, difficulty urinating, dysuria, frequency and hematuria.  Musculoskeletal: Negative.  Negative for arthralgias, back pain and myalgias.  Skin: Negative.  Negative for color change and rash.  Allergic/Immunologic: Negative.   Neurological: Positive for headaches.  Hematological: Negative for adenopathy. Does not bruise/bleed easily.  Psychiatric/Behavioral: Positive for sleep disturbance. Negative for decreased concentration, dysphoric mood, self-injury and suicidal ideas. The patient is nervous/anxious.     Objective:  BP (!) 162/80 (BP Location: Left Arm, Cuff Size: Normal)   Pulse (!) 56   Temp 98.1 F (36.7 C) (Oral)   Resp 16   Ht 6\' 1"  (1.854 m)   Wt 212 lb  (96.2 kg)   SpO2 97%   BMI 27.97 kg/m   BP Readings from Last 3 Encounters:  08/22/16 (!) 162/80  06/30/16 140/82  06/27/16 (!) 157/91    Wt Readings from Last 3 Encounters:  08/22/16 212 lb (96.2 kg)  06/30/16 202 lb (91.6 kg)  06/27/16 204 lb (92.5 kg)    Physical Exam  Constitutional: He is oriented to person, place, and time.  Non-toxic appearance. He does not have a sickly appearance. He does not appear ill. No distress.  HENT:  Mouth/Throat: Oropharynx is clear and moist. No oropharyngeal exudate.  Eyes: Conjunctivae are normal. Right eye exhibits no discharge. Left eye exhibits no discharge. No scleral icterus.  Neck: Normal range of motion. Neck supple. No JVD present. No thyromegaly present.  Cardiovascular: Normal rate, regular rhythm and intact distal pulses.  Exam reveals no gallop and no friction rub.   No murmur heard. EKG - Sinus  Rhythm  Voltage criteria for LVH  (S(V1)+R(V6) exceeds 3.50 mV).   -  Nonspecific T-abnormality.   ABNORMAL- no change from the prior EKG  Pulmonary/Chest: Effort normal and breath sounds normal. No respiratory distress. He has no wheezes. He has no rales. He exhibits no tenderness.  Abdominal: Soft. Bowel sounds are normal. He exhibits no distension and no mass. There is no tenderness. There is no rebound and no guarding.  Musculoskeletal: Normal range of motion. He exhibits edema (1+ pitting edema in BLE). He exhibits no tenderness or deformity.  Lymphadenopathy:    He has no cervical adenopathy.  Neurological: He is alert and oriented to person, place, and time.  Skin: Skin is warm and dry. No rash noted. He is not diaphoretic. No erythema. No pallor.  Psychiatric: He has a normal mood and affect. His behavior is normal. Judgment and thought content normal.  Vitals reviewed.   Lab Results  Component Value Date   WBC 7.0 08/22/2016   HGB 14.4 08/22/2016   HCT 42.6 08/22/2016   PLT 141.0 (L) 08/22/2016   GLUCOSE 106 (H)  08/22/2016   CHOL 110 01/25/2016   TRIG 120.0 01/25/2016   HDL 40.40 01/25/2016   LDLCALC 45 01/25/2016   ALT 20 08/22/2016   AST 24 08/22/2016   NA 140 08/22/2016   K 3.9 08/22/2016   CL 106 08/22/2016   CREATININE 0.81 08/22/2016   BUN 8 08/22/2016   CO2 32 08/22/2016   TSH 0.93 03/25/2015   PSA 0.82 03/25/2015   HGBA1C 8.3 (H) 08/22/2016   MICROALBUR 8.7 (H) 10/28/2015    Mr Brain Wo Contrast  Result Date: 07/06/2015 CLINICAL DATA:  Memory loss, confusion, and depression. EXAM: MRI HEAD WITHOUT CONTRAST TECHNIQUE: Multiplanar, multiecho pulse sequences of the brain and surrounding structures were obtained without intravenous contrast. COMPARISON:  09/18/2006 FINDINGS: There is no evidence of acute infarct, intracranial hemorrhage, mass, midline shift, or extra-axial fluid collection. Ventricles and  sulci are unchanged and within normal limits. The brain is normal in signal. Prior right cataract extraction is noted. A small right maxillary sinus mucous retention cyst and mild left maxillary sinus mucosal thickening are noted. The mastoid air cells are clear. Major intracranial vascular flow voids are preserved. Bone marrow signal is unremarkable. IMPRESSION: Unremarkable appearance of the brain. Electronically Signed   By: Logan Bores M.D.   On: 07/06/2015 13:22    Assessment & Plan:   Marvie was seen today for medicare wellness, hypertension and diabetes.  Diagnoses and all orders for this visit:  Type 2 diabetes mellitus with diabetic neuropathy, with long-term current use of insulin ([place])- will need to stop pioglitazone due to edema. His A1c shows that his blood sugars are not adequately well controlled. He will continue to follow with endocrinology about this. -     Comprehensive metabolic panel; Future -     Hemoglobin A1c; Future  Essential hypertension- his blood pressure is not well controlled and he has peripheral edema. Will add a thiazide diuretic to his current  regimen. His labs are negative for any secondary causes or end organ damage. -     EKG 12-Lead -     chlorthalidone (HYGROTON) 25 MG tablet; Take 1 tablet (25 mg total) by mouth daily. -     CBC with Differential/Platelet; Future -     Comprehensive metabolic panel; Future -     Urinalysis, Routine w reflex microscopic; Future  Intermittent claudication (HCC) -     VAS [name] ABI WITH/WO TBI; Future  Localized edema- I've asked him to stop taking pioglitazone since it can cause edema. He underwent a myocardial perfusion image about a year ago so I don't think this is related to ischemia. His BNP is normal so I don't think he has heart failure or fluid overload. His labs are negative for any concern for renal insufficiency or nephrotic syndrome. Will start a thiazide diuretic for blood pressure control and diuresis. -     EKG 12-Lead -     chlorthalidone (HYGROTON) 25 MG tablet; Take 1 tablet (25 mg total) by mouth daily. -     CBC with Differential/Platelet; Future -     Comprehensive metabolic panel; Future -     Brain natriuretic peptide; Future  Insomnia due to anxiety and fear -     Suvorexant (BELSOMRA) 20 MG TABS; Take 1 tablet by mouth at bedtime as needed.  Encounter for Medicare annual wellness exam   I have discontinued Nicholas Whitehead Melatonin, sildenafil, and pioglitazone. I am also having him start on chlorthalidone and Suvorexant. Additionally, I am having him maintain his cetirizine, aspirin EC, docusate sodium, ACCU-CHEK FASTCLIX LANCETS, triamcinolone cream, BYSTOLIC, B-D ULTRAFINE III SHORT PEN, trazodone, Glycopyrrolate-Formoterol, DULoxetine, irbesartan, clonazePAM, glucose blood, COMPLERA, gabapentin, LANTUS SOLOSTAR, pravastatin, and NOVOLOG FLEXPEN.  Meds ordered this encounter  Medications  . chlorthalidone (HYGROTON) 25 MG tablet    Sig: Take 1 tablet (25 mg total) by mouth daily.    Dispense:  90 tablet    Refill:  1  . Suvorexant (BELSOMRA) 20 MG TABS    Sig:  Take 1 tablet by mouth at bedtime as needed.    Dispense:  30 tablet    Refill:  5     Follow-up: Return in about 4 weeks (around 09/19/2016).  Scarlette Calico, MD

## 2016-08-22 NOTE — Patient Instructions (Addendum)
Continue doing brain stimulating activities (puzzles, reading, adult coloring books, staying active) to keep memory sharp.   Nicholas Whitehead , Thank you for taking time to come for your Medicare Wellness Visit. I appreciate your ongoing commitment to your health goals. Please review the following plan we discussed and let me know if I can assist you in the future.   These are the goals we discussed: Goals    . Be as active and independent as possible          I would like to get some pain relief and sleep.       This is a list of the screening recommended for you and due dates:  Health Maintenance  Topic Date Due  . Flu Shot  08/09/2016  . Eye exam for diabetics  10/12/2016  . Hemoglobin A1C  11/01/2016  . Complete foot exam   02/06/2017  . Colon Cancer Screening  04/25/2022  . Tetanus Vaccine  05/22/2023  . Pneumococcal vaccine  Completed  .  Hepatitis C: One time screening is recommended by Center for Disease Control  (CDC) for  adults born from [birth year] through 1965.   Completed  . HIV Screening  Completed      Stress and Stress Management Stress is a normal reaction to life events. It is what you feel when life demands more than you are used to or more than you can handle. Some stress can be useful. For example, the stress reaction can help you catch the last bus of the day, study for a test, or meet a deadline at work. But stress that occurs too often or for too long can cause problems. It can affect your emotional health and interfere with relationships and normal daily activities. Too much stress can weaken your immune system and increase your risk for physical illness. If you already have a medical problem, stress can make it worse. What are the causes? All sorts of life events may cause stress. An event that causes stress for one person may not be stressful for another person. Major life events commonly cause stress. These may be positive or negative. Examples include losing your  job, moving into a new home, getting married, having a baby, or losing a loved one. Less obvious life events may also cause stress, especially if they occur day after day or in combination. Examples include working long hours, driving in traffic, caring for children, being in debt, or being in a difficult relationship. What are the signs or symptoms? Stress may cause emotional symptoms including, the following:  Anxiety. This is feeling worried, afraid, on edge, overwhelmed, or out of control.  Anger. This is feeling irritated or impatient.  Depression. This is feeling sad, down, helpless, or guilty.  Difficulty focusing, remembering, or making decisions.  Stress may cause physical symptoms, including the following:  Aches and pains. These may affect your head, neck, back, stomach, or other areas of your body.  Tight muscles or clenched jaw.  Low energy or trouble sleeping.  Stress may cause unhealthy behaviors, including the following:  Eating to feel better (overeating) or skipping meals.  Sleeping too little, too much, or both.  Working too much or putting off tasks (procrastination).  Smoking, drinking alcohol, or using drugs to feel better.  How is this diagnosed? Stress is diagnosed through an assessment by your health care provider. Your health care provider will ask questions about your symptoms and any stressful life events.Your health care provider will also ask  about your medical history and may order blood tests or other tests. Certain medical conditions and medicine can cause physical symptoms similar to stress. Mental illness can cause emotional symptoms and unhealthy behaviors similar to stress. Your health care provider may refer you to a mental health professional for further evaluation. How is this treated? Stress management is the recommended treatment for stress.The goals of stress management are reducing stressful life events and coping with stress in healthy  ways. Techniques for reducing stressful life events include the following:  Stress identification. Self-monitor for stress and identify what causes stress for you. These skills may help you to avoid some stressful events.  Time management. Set your priorities, keep a calendar of events, and learn to say "no." These tools can help you avoid making too many commitments.  Techniques for coping with stress include the following:  Rethinking the problem. Try to think realistically about stressful events rather than ignoring them or overreacting. Try to find the positives in a stressful situation rather than focusing on the negatives.  Exercise. Physical exercise can release both physical and emotional tension. The key is to find a form of exercise you enjoy and do it regularly.  Relaxation techniques. These relax the body and mind. Examples include yoga, meditation, tai chi, biofeedback, deep breathing, progressive muscle relaxation, listening to music, being out in nature, journaling, and other hobbies. Again, the key is to find one or more that you enjoy and can do regularly.  Healthy lifestyle. Eat a balanced diet, get plenty of sleep, and do not smoke. Avoid using alcohol or drugs to relax.  Strong support network. Spend time with family, friends, or other people you enjoy being around.Express your feelings and talk things over with someone you trust.  Counseling or talktherapy with a mental health professional may be helpful if you are having difficulty managing stress on your own. Medicine is typically not recommended for the treatment of stress.Talk to your health care provider if you think you need medicine for symptoms of stress. Follow these instructions at home:  Keep all follow-up visits as directed by your health care provider.  Take all medicines as directed by your health care provider. Contact a health care provider if:  Your symptoms get worse or you start having new  symptoms.  You feel overwhelmed by your problems and can no longer manage them on your own. Get help right away if:  You feel like hurting yourself or someone else. This information is not intended to replace advice given to you by your health care provider. Make sure you discuss any questions you have with your health care provider. Document Released: 06/21/2000 Document Revised: 06/03/2015 Document Reviewed: 08/20/2012 Elsevier Interactive Patient Education  2017 [place]. Insomnia Insomnia is a sleep disorder that makes it difficult to fall asleep or to stay asleep. Insomnia can cause tiredness (fatigue), low energy, difficulty concentrating, mood swings, and poor performance at work or school. There are three different ways to classify insomnia:  Difficulty falling asleep.  Difficulty staying asleep.  Waking up too early in the morning.  Any type of insomnia can be long-term (chronic) or short-term (acute). Both are common. Short-term insomnia usually lasts for three months or less. Chronic insomnia occurs at least three times a week for longer than three months. What are the causes? Insomnia may be caused by another condition, situation, or substance, such as:  Anxiety.  Certain medicines.  Gastroesophageal reflux disease (GERD) or other gastrointestinal conditions.  Asthma or  other breathing conditions.  Restless legs syndrome, sleep apnea, or other sleep disorders.  Chronic pain.  Menopause. This may include hot flashes.  Stroke.  Abuse of alcohol, tobacco, or illegal drugs.  Depression.  Caffeine.  Neurological disorders, such as Alzheimer disease.  An overactive thyroid (hyperthyroidism).  The cause of insomnia may not be known. What increases the risk? Risk factors for insomnia include:  Gender. Women are more commonly affected than men.  Age. Insomnia is more common as you get older.  Stress. This may involve your professional or personal  life.  Income. Insomnia is more common in people with lower income.  Lack of exercise.  Irregular work schedule or night shifts.  Traveling between different time zones.  What are the signs or symptoms? If you have insomnia, trouble falling asleep or trouble staying asleep is the main symptom. This may lead to other symptoms, such as:  Feeling fatigued.  Feeling nervous about going to sleep.  Not feeling rested in the morning.  Having trouble concentrating.  Feeling irritable, anxious, or depressed.  How is this treated? Treatment for insomnia depends on the cause. If your insomnia is caused by an underlying condition, treatment will focus on addressing the condition. Treatment may also include:  Medicines to help you sleep.  Counseling or therapy.  Lifestyle adjustments.  Follow these instructions at home:  Take medicines only as directed by your health care provider.  Keep regular sleeping and waking hours. Avoid naps.  Keep a sleep diary to help you and your health care provider figure out what could be causing your insomnia. Include: ? When you sleep. ? When you wake up during the night. ? How well you sleep. ? How rested you feel the next day. ? Any side effects of medicines you are taking. ? What you eat and drink.  Make your bedroom a comfortable place where it is easy to fall asleep: ? Put up shades or special blackout curtains to block light from outside. ? Use a white noise machine to block noise. ? Keep the temperature cool.  Exercise regularly as directed by your health care provider. Avoid exercising right before bedtime.  Use relaxation techniques to manage stress. Ask your health care provider to suggest some techniques that may work well for you. These may include: ? Breathing exercises. ? Routines to release muscle tension. ? Visualizing peaceful scenes.  Cut back on alcohol, caffeinated beverages, and cigarettes, especially close to bedtime.  These can disrupt your sleep.  Do not overeat or eat spicy foods right before bedtime. This can lead to digestive discomfort that can make it hard for you to sleep.  Limit screen use before bedtime. This includes: ? Watching TV. ? Using your smartphone, tablet, and computer.  Stick to a routine. This can help you fall asleep faster. Try to do a quiet activity, brush your teeth, and go to bed at the same time each night.  Get out of bed if you are still awake after 15 minutes of trying to sleep. Keep the lights down, but try reading or doing a quiet activity. When you feel sleepy, go back to bed.  Make sure that you drive carefully. Avoid driving if you feel very sleepy.  Keep all follow-up appointments as directed by your health care provider. This is important. Contact a health care provider if:  You are tired throughout the day or have trouble in your daily routine due to sleepiness.  You continue to have sleep problems or  your sleep problems get worse. Get help right away if:  You have serious thoughts about hurting yourself or someone else. This information is not intended to replace advice given to you by your health care provider. Make sure you discuss any questions you have with your health care provider. Document Released: 12/24/1999 Document Revised: 05/28/2015 Document Reviewed: 09/26/2013 Elsevier Interactive Patient Education  [employer].

## 2016-08-23 LAB — CBC WITH DIFFERENTIAL/PLATELET
BASOS ABS: 0 10*3/uL (ref 0.0–0.1)
Basophils Relative: 0.5 % (ref 0.0–3.0)
EOS ABS: 0.1 10*3/uL (ref 0.0–0.7)
Eosinophils Relative: 1.4 % (ref 0.0–5.0)
HEMATOCRIT: 42.6 % (ref 39.0–52.0)
HEMOGLOBIN: 14.4 g/dL (ref 13.0–17.0)
LYMPHS PCT: 34.3 % (ref 12.0–46.0)
Lymphs Abs: 2.4 10*3/uL (ref 0.7–4.0)
MCHC: 33.8 g/dL (ref 30.0–36.0)
MCV: 97.9 fl (ref 78.0–100.0)
MONO ABS: 0.7 10*3/uL (ref 0.1–1.0)
Monocytes Relative: 9.9 % (ref 3.0–12.0)
Neutro Abs: 3.8 10*3/uL (ref 1.4–7.7)
Neutrophils Relative %: 53.9 % (ref 43.0–77.0)
Platelets: 141 10*3/uL — ABNORMAL LOW (ref 150.0–400.0)
RBC: 4.36 Mil/uL (ref 4.22–5.81)
RDW: 14.1 % (ref 11.5–15.5)
WBC: 7 10*3/uL (ref 4.0–10.5)

## 2016-08-23 LAB — URINALYSIS, ROUTINE W REFLEX MICROSCOPIC
Bilirubin Urine: NEGATIVE
HGB URINE DIPSTICK: NEGATIVE
KETONES UR: NEGATIVE
LEUKOCYTES UA: NEGATIVE
NITRITE: NEGATIVE
RBC / HPF: NONE SEEN (ref 0–?)
Specific Gravity, Urine: <=1.005 — AB (ref 1.000–1.030)
Total Protein, Urine: NEGATIVE
URINE GLUCOSE: NEGATIVE
Urobilinogen, UA: 0.2 (ref 0.0–1.0)
WBC, UA: NONE SEEN (ref 0–?)
pH: 6 (ref 5.0–8.0)

## 2016-08-23 LAB — HEMOGLOBIN A1C: HEMOGLOBIN A1C: 8.3 % — AB (ref 4.6–6.5)

## 2016-08-24 ENCOUNTER — Ambulatory Visit (HOSPITAL_COMMUNITY)
Admission: RE | Admit: 2016-08-24 | Discharge: 2016-08-24 | Disposition: A | Discharge status: Home / Self Care | Payer: Medicare HMO | Source: Ambulatory Visit | Attending: Vascular Surgery | Admitting: Vascular Surgery

## 2016-08-24 ENCOUNTER — Other Ambulatory Visit: Payer: Self-pay | Admitting: Endocrinology

## 2016-08-24 DIAGNOSIS — M79605 Pain in left leg: Secondary | ICD-10-CM | POA: Diagnosis not present

## 2016-08-24 DIAGNOSIS — M79604 Pain in right leg: Secondary | ICD-10-CM | POA: Diagnosis not present

## 2016-08-24 DIAGNOSIS — I739 Peripheral vascular disease, unspecified: Secondary | ICD-10-CM | POA: Diagnosis not present

## 2016-08-24 NOTE — Telephone Encounter (Signed)
MEDICATION: Lantus  PHARMACY: [facility]   IS THIS A 90 DAY SUPPLY : pt doesn't know  IS PATIENT OUT OF MEDICTAION: no  IF NOT; HOW MUCH IS LEFT:   LAST APPOINTMENT DATE: 06/22  NEXT APPOINTMENT DATE: 08/22  OTHER COMMENTS:    **Let patient know to contact pharmacy at the end of the day to make sure medication is ready. **  ** Please notify patient to allow 48-72 hours to process**  **Encourage patient to contact the pharmacy for refills or they can request refills through [facility]**

## 2016-08-25 ENCOUNTER — Telehealth: Payer: Self-pay | Admitting: *Deleted

## 2016-08-25 ENCOUNTER — Other Ambulatory Visit: Payer: Medicare HMO

## 2016-08-25 ENCOUNTER — Other Ambulatory Visit: Payer: Self-pay

## 2016-08-25 DIAGNOSIS — E1140 Type 2 diabetes mellitus with diabetic neuropathy, unspecified: Secondary | ICD-10-CM

## 2016-08-25 DIAGNOSIS — Z794 Long term (current) use of insulin: Principal | ICD-10-CM

## 2016-08-25 MED ORDER — INSULIN GLARGINE 100 UNIT/ML SOLOSTAR PEN: PEN_INJECTOR | SUBCUTANEOUS | 0 refills | Status: DC

## 2016-08-25 NOTE — Telephone Encounter (Signed)
Called patient and left a voice message to let him know this has been sent to his pharmacy.

## 2016-08-25 NOTE — Telephone Encounter (Signed)
During AWV, patient discussed difficulty controlling his diabetes, he has very limited support and no transportation, and financially cannot afford to buy diabetic friendly foods. Nurse contacted [facility] to discuss if patient would qualify to be seen by a [facility] nurse and SW, the patient does qualify. An order was placed, the patient was called and notified that [facility] would contact him by phone to set up home appointments. The patient was appreciative and verbalized understanding.

## 2016-08-29 ENCOUNTER — Telehealth: Payer: Self-pay | Admitting: Internal Medicine

## 2016-08-29 ENCOUNTER — Other Ambulatory Visit: Payer: Self-pay | Admitting: *Deleted

## 2016-08-29 NOTE — Patient Outreach (Signed)
[facility]) Care Management  08/29/2016  Mikail Goostree 1955-07-27 [id number]  Referral from MD office; uncontrolled diabetes with A1c of 9.7; unable to afford to eat diabetic healthy diet; other social needs:  Telephone call to patient; advised by person who answered call-wrong number. Telephone call to alternate number -recording stated number cannot be completed as dialed.   Plan: Will follow up.  Sherrin Daisy, RN BSN [place] Management Coordinator [facility] Care Management  [phone]

## 2016-08-29 NOTE — Telephone Encounter (Signed)
Patient calling in requesting results of test done at [facility].

## 2016-08-29 NOTE — Telephone Encounter (Signed)
Pt will have to call that center. There are no results in his chart.

## 2016-08-30 ENCOUNTER — Ambulatory Visit: Payer: Medicare HMO | Admitting: Endocrinology

## 2016-08-30 ENCOUNTER — Other Ambulatory Visit: Payer: Self-pay | Admitting: *Deleted

## 2016-08-30 NOTE — Telephone Encounter (Signed)
Notified patient.

## 2016-08-30 NOTE — Patient Outreach (Signed)
[facility]) Care Management  08/30/2016  Nicholas Whitehead 04-Apr-1955 [id number]  MD referral; uncontrolled diabetes -A1c 9.7, unable to afford to eat diabetic healthy diet & other social needs:  Telephone call to patient who was advised of reason for call & [facility] care management services.  HIPPA verification received.   Patient voices  that he lives alone & is independent of his care.  Per Hx: Medical condition includes diabetes, neuropathy, chronic bronchitis, HIV, depression Voices that he manages his own medications & takes as prescribed. Currently he is able to get prescriptions filled but is unable to afford over the counter medications like Zyrtec, colace  & vitamins.   States he uses [facility] to get to MD appointments & other places that he needs to go such as Management consultant.   Voices major concerns are uncontrolled diabetes; unable to afford food for healthy diabetic diet. States he goes to [employer] but often only can get foods that are left over & not as healthy to eat. States he would be interested in Meals on Wheels if available in his area.   Recommendation for [facility] services-Health Coach for disease management & Clinical Social Worker for [employer], financial & psycho-social concerns.   Patient consents to [facility] services.  . Recommendation made to try to get [facility] earlier during the day in order to get better selection. Patient states he would try to get there earlier.  Plan: Send to care management assistant to assign [place] Worker.  Sherrin Daisy, RN BSN [place] Management Coordinator [facility] Care Management  [phone]

## 2016-08-30 NOTE — Progress Notes (Deleted)
Patient ID: Nicholas Whitehead, male   DOB: 04-24-1955, 61 y.o.   MRN: [id number]   Reason for Appointment: Type II Diabetes follow-up   History of Present Illness   Diagnosis date: 1998    Previous history: He was started on oral agents at diagnosis and around 2006 was put on insulin His blood sugars are usually well controlled when he takes Actos along with his insulin. He had been on Invokana since 04/2013  Recent history:   Insulin regimen: Lantus 65-55 units bid;  Novolog  20 AC tid         He is on a regimen of basal bolus insulin with Actos. He has usually been requiring large doses of insulin especially  Lantus which he takes twice a day    A1c appears to be progressively increasing, 9.7 in April, previously had been fairly consistently below 7  Current management, blood sugar patterns and problems:  He had been poorly compliant with his diet, insulin and because of stress he says his blood sugars are out of control  He was seen by nurse educator earlier this month and apparently was stating that he takes Lantus 3 times a day with 20 units of Novolog and was told not to take Lantus 3 times a day, however not clear if he was giving an accurate history because his blood sugars do not look any different than before and he was not having hypoglycemia with his previous regimen  His blood sugars were looking relatively better a week ago but sporadically higher again now  He thinks that blood sugars are higher the last few days because of dealing with the funeral and death of a close friend  4 weeks reasons including hot weather he has not been walking for exercise  She'll weight is about the same  He does not adjust his NOVOLOG based on what he is eating her previous blood sugar  Also appears to have more variability in blood sugars generally at all times  FASTING blood sugars are periodically high also  Recently he thinks he is taking his Actos regularly, this has  been previously helpful in stabilizing his controlled  Oral hypoglycemic drugs: Actos 30 mg    Side effects from medications:  urgency, frequent urination from Invokana Proper timing of medications in relation to meals: Yes.          Monitors blood glucose:  3.4 times a day  Glucometer:  Accu-Chek  Mean values apply above for all meters except median for One Touch  PRE-MEAL Fasting Lunch Dinner Bedtime Overall  Glucose range:  102-178     68-295  68-310   Mean/median: 138  149  155   150+/-44            Meals: Breakfast variable, 9-10 AM Lunch 2-3pm  usually avoiding high-fat meals, dinner 6 pm         Physical activity: exercise: not walking Much recently         Dietician visit: Most recent: 2013   Weight control:  Wt Readings from Last 3 Encounters:  08/22/16 212 lb (96.2 kg)  06/30/16 202 lb (91.6 kg)  06/27/16 204 lb (92.5 kg)          Diabetes labs:  Lab Results  Component Value Date   HGBA1C 8.3 (H) 08/22/2016   HGBA1C 9.7 (H) 05/02/2016   HGBA1C 8.7 (H) 01/25/2016   Lab Results  Component Value Date   MICROALBUR 8.7 (H) 10/28/2015  LDLCALC 45 01/25/2016   CREATININE 0.81 08/22/2016   No visits with results within 1 Week(s) from this visit.  Latest known visit with results is:  Appointment on 08/22/2016  Component Date Value Ref Range Status  . WBC 08/22/2016 7.0  4.0 - 10.5 K/uL Final  . RBC 08/22/2016 4.36  4.22 - 5.81 Mil/uL Final  . Hemoglobin 08/22/2016 14.4  13.0 - 17.0 g/dL Final  . HCT 08/22/2016 42.6  39.0 - 52.0 % Final  . MCV 08/22/2016 97.9  78.0 - 100.0 fl Final  . MCHC 08/22/2016 33.8  30.0 - 36.0 g/dL Final  . RDW 08/22/2016 14.1  11.5 - 15.5 % Final  . Platelets 08/22/2016 141.0* 150.0 - 400.0 K/uL Final  . Neutrophils Relative % 08/22/2016 53.9  43.0 - 77.0 % Final  . Lymphocytes Relative 08/22/2016 34.3  12.0 - 46.0 % Final  . Monocytes Relative 08/22/2016 9.9  3.0 - 12.0 % Final  . Eosinophils Relative 08/22/2016 1.4  0.0 - 5.0 %  Final  . Basophils Relative 08/22/2016 0.5  0.0 - 3.0 % Final  . Neutro Abs 08/22/2016 3.8  1.4 - 7.7 K/uL Final  . Lymphs Abs 08/22/2016 2.4  0.7 - 4.0 K/uL Final  . Monocytes Absolute 08/22/2016 0.7  0.1 - 1.0 K/uL Final  . Eosinophils Absolute 08/22/2016 0.1  0.0 - 0.7 K/uL Final  . Basophils Absolute 08/22/2016 0.0  0.0 - 0.1 K/uL Final  . Sodium 08/22/2016 140  135 - 145 mEq/L Final  . Potassium 08/22/2016 3.9  3.5 - 5.1 mEq/L Final  . Chloride 08/22/2016 106  96 - 112 mEq/L Final  . CO2 08/22/2016 32  19 - 32 mEq/L Final  . Glucose, Bld 08/22/2016 106* 70 - 99 mg/dL Final  . BUN 08/22/2016 8  6 - 23 mg/dL Final  . Creatinine, Ser 08/22/2016 0.81  0.40 - 1.50 mg/dL Final  . Total Bilirubin 08/22/2016 0.3  0.2 - 1.2 mg/dL Final  . Alkaline Phosphatase 08/22/2016 51  39 - 117 U/L Final  . AST 08/22/2016 24  0 - 37 U/L Final  . ALT 08/22/2016 20  0 - 53 U/L Final  . Total Protein 08/22/2016 6.9  6.0 - 8.3 g/dL Final  . Albumin 08/22/2016 4.1  3.5 - 5.2 g/dL Final  . Calcium 08/22/2016 9.1  8.4 - 10.5 mg/dL Final  . GFR 08/22/2016 125.52  >60.00 mL/min Final  . Hgb A1c MFr Bld 08/22/2016 8.3* 4.6 - 6.5 % Final   Glycemic Control Guidelines for People with Diabetes:Non Diabetic:  <6%Goal of Therapy: <7%Additional Action Suggested:  >8%   . Color, Urine 08/22/2016 YELLOW  Yellow;Lt. Yellow Final  . APPearance 08/22/2016 CLEAR  Clear Final  . Specific Gravity, Urine 08/22/2016 <=1.005* 1.000 - 1.030 Final  . pH 08/22/2016 6.0  5.0 - 8.0 Final  . Total Protein, Urine 08/22/2016 NEGATIVE  Negative Final  . Urine Glucose 08/22/2016 NEGATIVE  Negative Final  . Ketones, ur 08/22/2016 NEGATIVE  Negative Final  . Bilirubin Urine 08/22/2016 NEGATIVE  Negative Final  . Hgb urine dipstick 08/22/2016 NEGATIVE  Negative Final  . Urobilinogen, UA 08/22/2016 0.2  0.0 - 1.0 Final  . Leukocytes, UA 08/22/2016 NEGATIVE  Negative Final  . Nitrite 08/22/2016 NEGATIVE  Negative Final  . WBC, UA  08/22/2016 none seen  0-2/hpf Final  . RBC / HPF 08/22/2016 none seen  0-2/hpf Final  . Pro B Natriuretic peptide (BNP) 08/22/2016 62.0  0.0 - 100.0 pg/mL Final  Allergies as of 08/30/2016      Reactions   Benazepril Cough   Glipizide Nausea Only      Medication List       Accurate as of 08/30/16 12:17 PM. Always use your most recent med list.          ACCU-CHEK FASTCLIX LANCETS Misc USE  TO CHECK BLOOD SUGAR THREE TIMES DAILY   aspirin EC 81 MG tablet Take 81 mg by mouth every morning.   B-D ULTRAFINE III SHORT PEN 31G X 8 MM Misc Generic drug:  Insulin Pen Needle use as directed   BYSTOLIC 10 MG tablet Generic drug:  nebivolol take 2 tablets by mouth daily   cetirizine 10 MG tablet Commonly known as:  ZYRTEC Take 10 mg by mouth daily.   chlorthalidone 25 MG tablet Commonly known as:  HYGROTON Take 1 tablet (25 mg total) by mouth daily.   clonazePAM 1 MG tablet Commonly known as:  KLONOPIN take 1 tablet by mouth twice a day if needed for anxiety   COMPLERA 200-25-300 MG tablet Generic drug:  emtricitabine-rilpivir-tenofovir DF take 1 tablet by mouth once daily   docusate sodium 100 MG capsule Commonly known as:  COLACE Take 100 mg by mouth every morning.   DULoxetine 60 MG capsule Commonly known as:  CYMBALTA Take 1 capsule (60 mg total) by mouth daily.   gabapentin 300 MG capsule Commonly known as:  NEURONTIN take 1 capsule by mouth three times a day   glucose blood test strip Commonly known as:  ACCU-CHEK GUIDE Use to check blood sugar three times per day.   Glycopyrrolate-Formoterol 9-4.8 MCG/ACT Aero Commonly known as:  BEVESPI AEROSPHERE Inhale 2 puffs into the lungs 2 (two) times daily.   Insulin Glargine 100 UNIT/ML Solostar Pen Commonly known as:  LANTUS SOLOSTAR INJECT 55 UNITS SUBCUTANEOUSLY EVERY MORNING AND 60 UNITS EVERY EVENING   irbesartan 300 MG tablet Commonly known as:  AVAPRO take 1 tablet by mouth once daily   NOVOLOG  FLEXPEN 100 UNIT/ML FlexPen Generic drug:  insulin aspart inject 20 to 25 units subcutaneously three times a day with meals   pravastatin 40 MG tablet Commonly known as:  PRAVACHOL take 1 tablet by mouth every evening   Suvorexant 20 MG Tabs Commonly known as:  BELSOMRA Take 1 tablet by mouth at bedtime as needed.   trazodone 300 MG tablet Commonly known as:  DESYREL Take 1 tablet (300 mg total) by mouth at bedtime.   triamcinolone cream 0.5 % Commonly known as:  KENALOG APPLY 1 APPLICATION TOPICALLY THREE TIME A DAY       Allergies:  Allergies  Allergen Reactions  . Benazepril Cough  . Glipizide Nausea Only    Past Medical History:  Diagnosis Date  . Anxiety   . Cataract    right   . Depression   . Diabetes mellitus without complication ([place])   . DM (diabetes mellitus screen)   . HIV disease ([place]) 09/12/1995  . HIV infection ([place])   . Hypercholesterolemia   . Hyperlipidemia   . Hypertension     Past Surgical History:  Procedure Laterality Date  . COLONOSCOPY WITH PROPOFOL  01/16/2012   Procedure: COLONOSCOPY WITH PROPOFOL;  Surgeon: Garlan Fair, MD;  Location: [facility];  Service: Endoscopy;  Laterality: N/A;  . EYE SURGERY  2005   catarct surgery  . optic lens surgery  2005    Family History  Problem Relation Age of Onset  . Arthritis Mother   .  Diabetes Mother   . Heart disease Mother   . Stroke Mother   . Arthritis Father   . Diabetes Father   . Heart disease Father     Social History:  reports that he has been smoking Cigarettes.  He has been smoking about 0.25 packs per day. He has never used smokeless tobacco. He reports that he does not drink alcohol or use drugs.  Review of Systems:   Hypertension:  currently on Bystolic and on Avapro, previously on atenolol and benazepril also Followed by PCP  Lipids: LDL is controlled with taking pravastatin HDL still low  Lab Results  Component Value Date   CHOL 110 01/25/2016   HDL 40.40  01/25/2016   LDLCALC 45 01/25/2016   TRIG 120.0 01/25/2016   CHOLHDL 3 01/25/2016    He has symptoms of neuropathy with paresthesia in feet and legs  He still thinks he is getting some pains now with 300 mg of gabapentin, 3 daily.  Also on Cymbalta prescribed by PCP  Depression:  followed by psychiatrist    LABS:  No visits with results within 1 Week(s) from this visit.  Latest known visit with results is:  Appointment on 08/22/2016  Component Date Value Ref Range Status  . WBC 08/22/2016 7.0  4.0 - 10.5 K/uL Final  . RBC 08/22/2016 4.36  4.22 - 5.81 Mil/uL Final  . Hemoglobin 08/22/2016 14.4  13.0 - 17.0 g/dL Final  . HCT 08/22/2016 42.6  39.0 - 52.0 % Final  . MCV 08/22/2016 97.9  78.0 - 100.0 fl Final  . MCHC 08/22/2016 33.8  30.0 - 36.0 g/dL Final  . RDW 08/22/2016 14.1  11.5 - 15.5 % Final  . Platelets 08/22/2016 141.0* 150.0 - 400.0 K/uL Final  . Neutrophils Relative % 08/22/2016 53.9  43.0 - 77.0 % Final  . Lymphocytes Relative 08/22/2016 34.3  12.0 - 46.0 % Final  . Monocytes Relative 08/22/2016 9.9  3.0 - 12.0 % Final  . Eosinophils Relative 08/22/2016 1.4  0.0 - 5.0 % Final  . Basophils Relative 08/22/2016 0.5  0.0 - 3.0 % Final  . Neutro Abs 08/22/2016 3.8  1.4 - 7.7 K/uL Final  . Lymphs Abs 08/22/2016 2.4  0.7 - 4.0 K/uL Final  . Monocytes Absolute 08/22/2016 0.7  0.1 - 1.0 K/uL Final  . Eosinophils Absolute 08/22/2016 0.1  0.0 - 0.7 K/uL Final  . Basophils Absolute 08/22/2016 0.0  0.0 - 0.1 K/uL Final  . Sodium 08/22/2016 140  135 - 145 mEq/L Final  . Potassium 08/22/2016 3.9  3.5 - 5.1 mEq/L Final  . Chloride 08/22/2016 106  96 - 112 mEq/L Final  . CO2 08/22/2016 32  19 - 32 mEq/L Final  . Glucose, Bld 08/22/2016 106* 70 - 99 mg/dL Final  . BUN 08/22/2016 8  6 - 23 mg/dL Final  . Creatinine, Ser 08/22/2016 0.81  0.40 - 1.50 mg/dL Final  . Total Bilirubin 08/22/2016 0.3  0.2 - 1.2 mg/dL Final  . Alkaline Phosphatase 08/22/2016 51  39 - 117 U/L Final  . AST  08/22/2016 24  0 - 37 U/L Final  . ALT 08/22/2016 20  0 - 53 U/L Final  . Total Protein 08/22/2016 6.9  6.0 - 8.3 g/dL Final  . Albumin 08/22/2016 4.1  3.5 - 5.2 g/dL Final  . Calcium 08/22/2016 9.1  8.4 - 10.5 mg/dL Final  . GFR 08/22/2016 125.52  >60.00 mL/min Final  . Hgb A1c MFr Bld 08/22/2016 8.3* 4.6 -  6.5 % Final   Glycemic Control Guidelines for People with Diabetes:Non Diabetic:  <6%Goal of Therapy: <7%Additional Action Suggested:  >8%   . Color, Urine 08/22/2016 YELLOW  Yellow;Lt. Yellow Final  . APPearance 08/22/2016 CLEAR  Clear Final  . Specific Gravity, Urine 08/22/2016 <=1.005* 1.000 - 1.030 Final  . pH 08/22/2016 6.0  5.0 - 8.0 Final  . Total Protein, Urine 08/22/2016 NEGATIVE  Negative Final  . Urine Glucose 08/22/2016 NEGATIVE  Negative Final  . Ketones, ur 08/22/2016 NEGATIVE  Negative Final  . Bilirubin Urine 08/22/2016 NEGATIVE  Negative Final  . Hgb urine dipstick 08/22/2016 NEGATIVE  Negative Final  . Urobilinogen, UA 08/22/2016 0.2  0.0 - 1.0 Final  . Leukocytes, UA 08/22/2016 NEGATIVE  Negative Final  . Nitrite 08/22/2016 NEGATIVE  Negative Final  . WBC, UA 08/22/2016 none seen  0-2/hpf Final  . RBC / HPF 08/22/2016 none seen  0-2/hpf Final  . Pro B Natriuretic peptide (BNP) 08/22/2016 62.0  0.0 - 100.0 pg/mL Final     Examination:   There were no vitals taken for this visit.  There is no height or weight on file to calculate BMI.     ASSESSMENT/ PLAN:    Diabetes type 2:  See history of present illness for detailed discussion of his current management, blood sugar patterns and problems identified  His blood sugars are fluctuating although on an average about 140-150 most of the times of the day and slightly better in the last 2 weeks although inconsistently Because of changes in his lifestyle with variable diet, eating out, and consistent exercise and taking somewhat arbitrary doses of insulin in the past month his blood sugars fluctuate and this was  discussed  Currently since his fasting readings are mostly high except a couple of times he will go up to 60 units of Lantus in the evening He may need 25 units of Novolog at suppertime especially when eating larger amount of carbohydrate or eating out Discussed blood sugar targets at various times  Hypertension: Controlled     There are no Patient Instructions on file for this visit.     Nicholas Whitehead 08/30/2016, 12:17 PM         ..           Patient ID: Nicholas Whitehead, male   DOB: [birth year], 61 y.o.   MRN: [id number]   Reason for Appointment: Type II Diabetes follow-up   History of Present Illness   Diagnosis date: 1998    Previous history: He was started on oral agents at diagnosis and around 2006 was put on insulin His blood sugars are usually well controlled when he takes Actos along with his insulin. He had been on Invokana since 04/2013  Recent history:   Insulin regimen: Lantus 65-55 units bid;  Novolog  20 AC tid         He is on a regimen of basal bolus insulin with Actos. He has usually been requiring large doses of insulin especially  Lantus which he takes twice a day    A1c appears to be progressively increasing, 9.7 in April, previously had been fairly consistently below 7  Current management, blood sugar patterns and problems:  He had been poorly compliant with his diet, insulin and because of stress he says his blood sugars are out of control  He was seen by nurse educator earlier this month and apparently was stating that he takes Lantus 3 times a day with 20 units of Novolog and was  told not to take Lantus 3 times a day, however not clear if he was giving an accurate history because his blood sugars do not look any different than before and he was not having hypoglycemia with his previous regimen  His blood sugars were looking relatively better a week ago but sporadically higher again now  He thinks that blood sugars are higher the last few days because  of dealing with the funeral and death of a close friend  4 weeks reasons including hot weather he has not been walking for exercise  She'll weight is about the same  He does not adjust his NOVOLOG based on what he is eating her previous blood sugar  Also appears to have more variability in blood sugars generally at all times  FASTING blood sugars are periodically high also  Recently he thinks he is taking his Actos regularly, this has been previously helpful in stabilizing his controlled  Oral hypoglycemic drugs: Actos 30 mg    Side effects from medications:  urgency, frequent urination from Invokana Proper timing of medications in relation to meals: Yes.          Monitors blood glucose:  3.4 times a day  Glucometer:  Accu-Chek  Mean values apply above for all meters except median for One Touch  PRE-MEAL Fasting Lunch Dinner Bedtime Overall  Glucose range:  102-178     68-295  68-310   Mean/median: 138  149  155   150+/-44            Meals: Breakfast variable, 9-10 AM Lunch 2-3pm  usually avoiding high-fat meals, dinner 6 pm         Physical activity: exercise: not walking Much recently         Dietician visit: Most recent: 2013   Weight control:  Wt Readings from Last 3 Encounters:  08/22/16 212 lb (96.2 kg)  06/30/16 202 lb (91.6 kg)  06/27/16 204 lb (92.5 kg)          Diabetes labs:  Lab Results  Component Value Date   HGBA1C 8.3 (H) 08/22/2016   HGBA1C 9.7 (H) 05/02/2016   HGBA1C 8.7 (H) 01/25/2016   Lab Results  Component Value Date   MICROALBUR 8.7 (H) 10/28/2015   LDLCALC 45 01/25/2016   CREATININE 0.81 08/22/2016   No visits with results within 1 Week(s) from this visit.  Latest known visit with results is:  Appointment on 08/22/2016  Component Date Value Ref Range Status  . WBC 08/22/2016 7.0  4.0 - 10.5 K/uL Final  . RBC 08/22/2016 4.36  4.22 - 5.81 Mil/uL Final  . Hemoglobin 08/22/2016 14.4  13.0 - 17.0 g/dL Final  . HCT 08/22/2016 42.6  39.0 -  52.0 % Final  . MCV 08/22/2016 97.9  78.0 - 100.0 fl Final  . MCHC 08/22/2016 33.8  30.0 - 36.0 g/dL Final  . RDW 08/22/2016 14.1  11.5 - 15.5 % Final  . Platelets 08/22/2016 141.0* 150.0 - 400.0 K/uL Final  . Neutrophils Relative % 08/22/2016 53.9  43.0 - 77.0 % Final  . Lymphocytes Relative 08/22/2016 34.3  12.0 - 46.0 % Final  . Monocytes Relative 08/22/2016 9.9  3.0 - 12.0 % Final  . Eosinophils Relative 08/22/2016 1.4  0.0 - 5.0 % Final  . Basophils Relative 08/22/2016 0.5  0.0 - 3.0 % Final  . Neutro Abs 08/22/2016 3.8  1.4 - 7.7 K/uL Final  . Lymphs Abs 08/22/2016 2.4  0.7 - 4.0 K/uL Final  .  Monocytes Absolute 08/22/2016 0.7  0.1 - 1.0 K/uL Final  . Eosinophils Absolute 08/22/2016 0.1  0.0 - 0.7 K/uL Final  . Basophils Absolute 08/22/2016 0.0  0.0 - 0.1 K/uL Final  . Sodium 08/22/2016 140  135 - 145 mEq/L Final  . Potassium 08/22/2016 3.9  3.5 - 5.1 mEq/L Final  . Chloride 08/22/2016 106  96 - 112 mEq/L Final  . CO2 08/22/2016 32  19 - 32 mEq/L Final  . Glucose, Bld 08/22/2016 106* 70 - 99 mg/dL Final  . BUN 08/22/2016 8  6 - 23 mg/dL Final  . Creatinine, Ser 08/22/2016 0.81  0.40 - 1.50 mg/dL Final  . Total Bilirubin 08/22/2016 0.3  0.2 - 1.2 mg/dL Final  . Alkaline Phosphatase 08/22/2016 51  39 - 117 U/L Final  . AST 08/22/2016 24  0 - 37 U/L Final  . ALT 08/22/2016 20  0 - 53 U/L Final  . Total Protein 08/22/2016 6.9  6.0 - 8.3 g/dL Final  . Albumin 08/22/2016 4.1  3.5 - 5.2 g/dL Final  . Calcium 08/22/2016 9.1  8.4 - 10.5 mg/dL Final  . GFR 08/22/2016 125.52  >60.00 mL/min Final  . Hgb A1c MFr Bld 08/22/2016 8.3* 4.6 - 6.5 % Final   Glycemic Control Guidelines for People with Diabetes:Non Diabetic:  <6%Goal of Therapy: <7%Additional Action Suggested:  >8%   . Color, Urine 08/22/2016 YELLOW  Yellow;Lt. Yellow Final  . APPearance 08/22/2016 CLEAR  Clear Final  . Specific Gravity, Urine 08/22/2016 <=1.005* 1.000 - 1.030 Final  . pH 08/22/2016 6.0  5.0 - 8.0 Final  . Total  Protein, Urine 08/22/2016 NEGATIVE  Negative Final  . Urine Glucose 08/22/2016 NEGATIVE  Negative Final  . Ketones, ur 08/22/2016 NEGATIVE  Negative Final  . Bilirubin Urine 08/22/2016 NEGATIVE  Negative Final  . Hgb urine dipstick 08/22/2016 NEGATIVE  Negative Final  . Urobilinogen, UA 08/22/2016 0.2  0.0 - 1.0 Final  . Leukocytes, UA 08/22/2016 NEGATIVE  Negative Final  . Nitrite 08/22/2016 NEGATIVE  Negative Final  . WBC, UA 08/22/2016 none seen  0-2/hpf Final  . RBC / HPF 08/22/2016 none seen  0-2/hpf Final  . Pro B Natriuretic peptide (BNP) 08/22/2016 62.0  0.0 - 100.0 pg/mL Final     Allergies as of 08/30/2016      Reactions   Benazepril Cough   Glipizide Nausea Only      Medication List       Accurate as of 08/30/16 12:17 PM. Always use your most recent med list.          ACCU-CHEK FASTCLIX LANCETS Misc USE  TO CHECK BLOOD SUGAR THREE TIMES DAILY   aspirin EC 81 MG tablet Take 81 mg by mouth every morning.   B-D ULTRAFINE III SHORT PEN 31G X 8 MM Misc Generic drug:  Insulin Pen Needle use as directed   BYSTOLIC 10 MG tablet Generic drug:  nebivolol take 2 tablets by mouth daily   cetirizine 10 MG tablet Commonly known as:  ZYRTEC Take 10 mg by mouth daily.   chlorthalidone 25 MG tablet Commonly known as:  HYGROTON Take 1 tablet (25 mg total) by mouth daily.   clonazePAM 1 MG tablet Commonly known as:  KLONOPIN take 1 tablet by mouth twice a day if needed for anxiety   COMPLERA 200-25-300 MG tablet Generic drug:  emtricitabine-rilpivir-tenofovir DF take 1 tablet by mouth once daily   docusate sodium 100 MG capsule Commonly known as:  COLACE Take 100 mg  by mouth every morning.   DULoxetine 60 MG capsule Commonly known as:  CYMBALTA Take 1 capsule (60 mg total) by mouth daily.   gabapentin 300 MG capsule Commonly known as:  NEURONTIN take 1 capsule by mouth three times a day   glucose blood test strip Commonly known as:  ACCU-CHEK GUIDE Use to  check blood sugar three times per day.   Glycopyrrolate-Formoterol 9-4.8 MCG/ACT Aero Commonly known as:  BEVESPI AEROSPHERE Inhale 2 puffs into the lungs 2 (two) times daily.   Insulin Glargine 100 UNIT/ML Solostar Pen Commonly known as:  LANTUS SOLOSTAR INJECT 55 UNITS SUBCUTANEOUSLY EVERY MORNING AND 60 UNITS EVERY EVENING   irbesartan 300 MG tablet Commonly known as:  AVAPRO take 1 tablet by mouth once daily   NOVOLOG FLEXPEN 100 UNIT/ML FlexPen Generic drug:  insulin aspart inject 20 to 25 units subcutaneously three times a day with meals   pravastatin 40 MG tablet Commonly known as:  PRAVACHOL take 1 tablet by mouth every evening   Suvorexant 20 MG Tabs Commonly known as:  BELSOMRA Take 1 tablet by mouth at bedtime as needed.   trazodone 300 MG tablet Commonly known as:  DESYREL Take 1 tablet (300 mg total) by mouth at bedtime.   triamcinolone cream 0.5 % Commonly known as:  KENALOG APPLY 1 APPLICATION TOPICALLY THREE TIME A DAY       Allergies:  Allergies  Allergen Reactions  . Benazepril Cough  . Glipizide Nausea Only    Past Medical History:  Diagnosis Date  . Anxiety   . Cataract    right   . Depression   . Diabetes mellitus without complication ([place])   . DM (diabetes mellitus screen)   . HIV disease ([place]) 09/12/1995  . HIV infection ([place])   . Hypercholesterolemia   . Hyperlipidemia   . Hypertension     Past Surgical History:  Procedure Laterality Date  . COLONOSCOPY WITH PROPOFOL  01/16/2012   Procedure: COLONOSCOPY WITH PROPOFOL;  Surgeon: Garlan Fair, MD;  Location: [facility];  Service: Endoscopy;  Laterality: N/A;  . EYE SURGERY  2005   catarct surgery  . optic lens surgery  2005    Family History  Problem Relation Age of Onset  . Arthritis Mother   . Diabetes Mother   . Heart disease Mother   . Stroke Mother   . Arthritis Father   . Diabetes Father   . Heart disease Father     Social History:  reports that he has been  smoking Cigarettes.  He has been smoking about 0.25 packs per day. He has never used smokeless tobacco. He reports that he does not drink alcohol or use drugs.  Review of Systems:   Hypertension:  currently on Bystolic and on Avapro, previously on atenolol and benazepril also Followed by PCP  Lipids: LDL is controlled with taking pravastatin HDL still low  Lab Results  Component Value Date   CHOL 110 01/25/2016   HDL 40.40 01/25/2016   LDLCALC 45 01/25/2016   TRIG 120.0 01/25/2016   CHOLHDL 3 01/25/2016    He has symptoms of neuropathy with paresthesia in feet and legs  He still thinks he is getting some pains now with 300 mg of gabapentin, 3 daily.  Also on Cymbalta prescribed by PCP  Depression:  followed by psychiatrist    LABS:  No visits with results within 1 Week(s) from this visit.  Latest known visit with results is:  Appointment on 08/22/2016  Component Date Value Ref Range Status  . WBC 08/22/2016 7.0  4.0 - 10.5 K/uL Final  . RBC 08/22/2016 4.36  4.22 - 5.81 Mil/uL Final  . Hemoglobin 08/22/2016 14.4  13.0 - 17.0 g/dL Final  . HCT 08/22/2016 42.6  39.0 - 52.0 % Final  . MCV 08/22/2016 97.9  78.0 - 100.0 fl Final  . MCHC 08/22/2016 33.8  30.0 - 36.0 g/dL Final  . RDW 08/22/2016 14.1  11.5 - 15.5 % Final  . Platelets 08/22/2016 141.0* 150.0 - 400.0 K/uL Final  . Neutrophils Relative % 08/22/2016 53.9  43.0 - 77.0 % Final  . Lymphocytes Relative 08/22/2016 34.3  12.0 - 46.0 % Final  . Monocytes Relative 08/22/2016 9.9  3.0 - 12.0 % Final  . Eosinophils Relative 08/22/2016 1.4  0.0 - 5.0 % Final  . Basophils Relative 08/22/2016 0.5  0.0 - 3.0 % Final  . Neutro Abs 08/22/2016 3.8  1.4 - 7.7 K/uL Final  . Lymphs Abs 08/22/2016 2.4  0.7 - 4.0 K/uL Final  . Monocytes Absolute 08/22/2016 0.7  0.1 - 1.0 K/uL Final  . Eosinophils Absolute 08/22/2016 0.1  0.0 - 0.7 K/uL Final  . Basophils Absolute 08/22/2016 0.0  0.0 - 0.1 K/uL Final  . Sodium 08/22/2016 140  135 - 145  mEq/L Final  . Potassium 08/22/2016 3.9  3.5 - 5.1 mEq/L Final  . Chloride 08/22/2016 106  96 - 112 mEq/L Final  . CO2 08/22/2016 32  19 - 32 mEq/L Final  . Glucose, Bld 08/22/2016 106* 70 - 99 mg/dL Final  . BUN 08/22/2016 8  6 - 23 mg/dL Final  . Creatinine, Ser 08/22/2016 0.81  0.40 - 1.50 mg/dL Final  . Total Bilirubin 08/22/2016 0.3  0.2 - 1.2 mg/dL Final  . Alkaline Phosphatase 08/22/2016 51  39 - 117 U/L Final  . AST 08/22/2016 24  0 - 37 U/L Final  . ALT 08/22/2016 20  0 - 53 U/L Final  . Total Protein 08/22/2016 6.9  6.0 - 8.3 g/dL Final  . Albumin 08/22/2016 4.1  3.5 - 5.2 g/dL Final  . Calcium 08/22/2016 9.1  8.4 - 10.5 mg/dL Final  . GFR 08/22/2016 125.52  >60.00 mL/min Final  . Hgb A1c MFr Bld 08/22/2016 8.3* 4.6 - 6.5 % Final   Glycemic Control Guidelines for People with Diabetes:Non Diabetic:  <6%Goal of Therapy: <7%Additional Action Suggested:  >8%   . Color, Urine 08/22/2016 YELLOW  Yellow;Lt. Yellow Final  . APPearance 08/22/2016 CLEAR  Clear Final  . Specific Gravity, Urine 08/22/2016 <=1.005* 1.000 - 1.030 Final  . pH 08/22/2016 6.0  5.0 - 8.0 Final  . Total Protein, Urine 08/22/2016 NEGATIVE  Negative Final  . Urine Glucose 08/22/2016 NEGATIVE  Negative Final  . Ketones, ur 08/22/2016 NEGATIVE  Negative Final  . Bilirubin Urine 08/22/2016 NEGATIVE  Negative Final  . Hgb urine dipstick 08/22/2016 NEGATIVE  Negative Final  . Urobilinogen, UA 08/22/2016 0.2  0.0 - 1.0 Final  . Leukocytes, UA 08/22/2016 NEGATIVE  Negative Final  . Nitrite 08/22/2016 NEGATIVE  Negative Final  . WBC, UA 08/22/2016 none seen  0-2/hpf Final  . RBC / HPF 08/22/2016 none seen  0-2/hpf Final  . Pro B Natriuretic peptide (BNP) 08/22/2016 62.0  0.0 - 100.0 pg/mL Final     Examination:   There were no vitals taken for this visit.  There is no height or weight on file to calculate BMI.     ASSESSMENT/ PLAN:  Diabetes type 2:  See history of present illness for detailed discussion of  his current management, blood sugar patterns and problems identified  His blood sugars are fluctuating although on an average about 140-150 most of the times of the day and slightly better in the last 2 weeks although inconsistently Because of changes in his lifestyle with variable diet, eating out, and consistent exercise and taking somewhat arbitrary doses of insulin in the past month his blood sugars fluctuate and this was discussed  Currently since his fasting readings are mostly high except a couple of times he will go up to 60 units of Lantus in the evening He may need 25 units of Novolog at suppertime especially when eating larger amount of carbohydrate or eating out Discussed blood sugar targets at various times  Hypertension: Controlled     There are no Patient Instructions on file for this visit.     Nicholas Whitehead 08/30/2016, 12:17 PM         ..

## 2016-08-31 ENCOUNTER — Other Ambulatory Visit: Payer: Self-pay | Admitting: Internal Medicine

## 2016-08-31 ENCOUNTER — Telehealth: Payer: Self-pay | Admitting: *Deleted

## 2016-08-31 NOTE — Telephone Encounter (Signed)
It looks like there is a mild decrease in the blood flow to his feet  He needs to quit smoking  If his symptoms do not resolve soon then I will order a referral for him to see a vascular specialist

## 2016-08-31 NOTE — Telephone Encounter (Signed)
Patient called concerned about not receiving results from a vascular study he had regarding the swelling in his legs. Nurse states she would inform the PCP. Patient verbalized understanding and was appreciative.

## 2016-09-01 NOTE — Telephone Encounter (Signed)
Called patient to inform him regarding the results of his vascular study and discussed with him to that he needs to quit smoking.

## 2016-09-03 ENCOUNTER — Other Ambulatory Visit: Payer: Self-pay | Admitting: Internal Medicine

## 2016-09-05 ENCOUNTER — Telehealth: Payer: Self-pay | Admitting: Endocrinology

## 2016-09-05 NOTE — Telephone Encounter (Signed)
Please advise if you want to prescribe this medication. I do not see on his med list and not much described in previous OV note. Note on prescription refill states:  The source prescription was discontinued on 08/22/2016 by [name], MD for the following reason: Side effect (s). Please advise.

## 2016-09-05 NOTE — Telephone Encounter (Signed)
He is taking this.  Need 30 day prescription with no refills and reminder to make follow-up appointment, recently no-show

## 2016-09-05 NOTE — Telephone Encounter (Signed)
I have approved this for 30 days with no refills and a note to pharmacy that patient needs a follow up appointment for further refills.

## 2016-09-11 ENCOUNTER — Emergency Department (HOSPITAL_COMMUNITY)
Admission: EM | Admit: 2016-09-11 | Discharge: 2016-09-11 | Disposition: A | Discharge status: Home / Self Care | Payer: Medicare HMO | Attending: Emergency Medicine | Admitting: Emergency Medicine

## 2016-09-11 ENCOUNTER — Emergency Department (HOSPITAL_COMMUNITY): Payer: Medicare HMO

## 2016-09-11 DIAGNOSIS — Z79899 Other long term (current) drug therapy: Secondary | ICD-10-CM | POA: Insufficient documentation

## 2016-09-11 DIAGNOSIS — F17210 Nicotine dependence, cigarettes, uncomplicated: Secondary | ICD-10-CM | POA: Diagnosis not present

## 2016-09-11 DIAGNOSIS — I10 Essential (primary) hypertension: Secondary | ICD-10-CM | POA: Insufficient documentation

## 2016-09-11 DIAGNOSIS — Z7982 Long term (current) use of aspirin: Secondary | ICD-10-CM | POA: Diagnosis not present

## 2016-09-11 DIAGNOSIS — B20 Human immunodeficiency virus [HIV] disease: Secondary | ICD-10-CM | POA: Diagnosis not present

## 2016-09-11 DIAGNOSIS — K5900 Constipation, unspecified: Secondary | ICD-10-CM | POA: Diagnosis not present

## 2016-09-11 DIAGNOSIS — E119 Type 2 diabetes mellitus without complications: Secondary | ICD-10-CM | POA: Insufficient documentation

## 2016-09-11 DIAGNOSIS — R109 Unspecified abdominal pain: Secondary | ICD-10-CM | POA: Diagnosis not present

## 2016-09-11 LAB — CBC
HEMATOCRIT: 47.1 % (ref 39.0–52.0)
HEMOGLOBIN: 17.3 g/dL — AB (ref 13.0–17.0)
MCH: 33.3 pg (ref 26.0–34.0)
MCHC: 36.7 g/dL — AB (ref 30.0–36.0)
MCV: 90.8 fL (ref 78.0–100.0)
Platelets: 163 10*3/uL (ref 150–400)
RBC: 5.19 MIL/uL (ref 4.22–5.81)
RDW: 12.5 % (ref 11.5–15.5)
WBC: 11 10*3/uL — ABNORMAL HIGH (ref 4.0–10.5)

## 2016-09-11 LAB — COMPREHENSIVE METABOLIC PANEL
ALT: 40 U/L (ref 17–63)
AST: 37 U/L (ref 15–41)
Albumin: 4.6 g/dL (ref 3.5–5.0)
Alkaline Phosphatase: 67 U/L (ref 38–126)
Anion gap: 11 (ref 5–15)
BILIRUBIN TOTAL: 0.6 mg/dL (ref 0.3–1.2)
BUN: 19 mg/dL (ref 6–20)
CO2: 32 mmol/L (ref 22–32)
CREATININE: 1.22 mg/dL (ref 0.61–1.24)
Calcium: 10.1 mg/dL (ref 8.9–10.3)
Chloride: 89 mmol/L — ABNORMAL LOW (ref 101–111)
GFR calc Af Amer: >60 mL/min (ref >60)
GFR calc non Af Amer: >60 mL/min (ref >60)
GLUCOSE: 331 mg/dL — AB (ref 65–99)
Potassium: 4.4 mmol/L (ref 3.5–5.1)
Sodium: 132 mmol/L — ABNORMAL LOW (ref 135–145)
TOTAL PROTEIN: 9.1 g/dL — AB (ref 6.5–8.1)

## 2016-09-11 LAB — LIPASE, BLOOD: LIPASE: 41 U/L (ref 11–51)

## 2016-09-11 NOTE — ED Notes (Addendum)
Pt states his last good BM was one day last week. Reports taking milk of magnesia, stool softeners, and miralax without relief. Pt also reports cramps in his hands and legs and that his voice is weak.

## 2016-09-11 NOTE — ED Provider Notes (Signed)
[facility] DEPT Provider Note   CSN: [id number] Arrival date & time: 09/11/16  1303     History   Chief Complaint Chief Complaint  Patient presents with  . Constipation    HPI Nicholas Whitehead is a 61 y.o. male.  HPI  61 y.o. male with a hx of DM, HIV, HTN, HLD, presents to the Emergency Department today due to feeling constipated all weekend. Pt states that he has had liquid "soft" stool, but no formed stool. Notes mild abdominal discomfort. Denies pain currently. No N/V/D. Passing flatus. No CP/SOB. States symptoms x 1 week. Notes attempting Miralax, Dolculax, Fleet Enemas, Magnesium Citrate with effect, but patient states that he still feels "something in there." Notes no straining. No pain with bowel movements. No fevers. No other symptoms noted. .   Past Medical History:  Diagnosis Date  . Anxiety   . Cataract    right   . Depression   . Diabetes mellitus without complication ([place])   . DM (diabetes mellitus screen)   . HIV disease ([place]) 09/12/1995  . HIV infection ([place])   . Hypercholesterolemia   . Hyperlipidemia   . Hypertension     Patient Active Problem List   Diagnosis Date Noted  . Localized edema 08/22/2016  . Insomnia due to anxiety and fear 08/22/2016  . Intermittent claudication ([place]) 02/07/2016  . Medication monitoring encounter 12/23/2015  . Drug-induced erectile dysfunction 09/06/2015  . Simple chronic bronchitis ([place]) 05/06/2015  . Mild cognitive impairment 05/06/2015  . Type 2 diabetes mellitus with diabetic neuropathy, with long-term current use of insulin ([place]) 04/30/2015  . Nonspecific abnormal electrocardiogram (ECG) (EKG) 03/25/2015  . Routine general medical examination at a health care facility 05/21/2013  . BPH (benign prostatic hyperplasia) 05/21/2013  . 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 Anxiety and depression 04/18/2011  . Hyperlipidemia   . Diabetes mellitus with diabetic neuropathy ([place])   . Hypertension   . HIV disease ([place]) 09/12/1995      Past Surgical History:  Procedure Laterality Date  . COLONOSCOPY WITH PROPOFOL  01/16/2012   Procedure: COLONOSCOPY WITH PROPOFOL;  Surgeon: Garlan Fair, MD;  Location: [facility] ENDOSCOPY;  Service: Endoscopy;  Laterality: N/A;  . EYE SURGERY  2005   catarct surgery  . optic lens surgery  2005       Home Medications    Prior to Admission medications   Medication Sig Start Date End Date Taking? Authorizing Provider  ACCU-CHEK FASTCLIX LANCETS MISC USE  TO CHECK BLOOD SUGAR THREE TIMES DAILY 03/31/15   Elayne Snare, MD  aspirin EC 81 MG tablet Take 81 mg by mouth every morning.    [provider]  B-D ULTRAFINE III SHORT PEN 31G X 8 MM MISC use as directed five times a day 09/04/16   [name], MD  BYSTOLIC 10 MG tablet take 2 tablets by mouth daily 01/10/16   [name], MD  cetirizine (ZYRTEC) 10 MG tablet Take 10 mg by mouth daily.    [provider]  chlorthalidone (HYGROTON) 25 MG tablet Take 1 tablet (25 mg total) by mouth daily. 08/22/16   [name], MD  clonazePAM [facility] [place]) 1 MG tablet take 1 tablet by mouth twice a day if needed for anxiety 03/17/16   [name], MD  [facility] 200-25-300 MG tablet take 1 tablet by mouth once daily 06/19/16   Thayer Headings, MD  docusate sodium (COLACE) 100 MG capsule Take 100 mg by mouth every morning.  [provider]  DULoxetine (CYMBALTA) 60 MG capsule Take 1 capsule (60 mg total) by mouth daily. 02/07/16   [name], MD  gabapentin (NEURONTIN) 300 MG capsule take 1 capsule by mouth three times a day 07/10/16   Elayne Snare, MD  glucose blood (ACCU-CHEK GUIDE) test strip Use to check blood sugar three times per day. 05/11/16   Elayne Snare, MD  Glycopyrrolate-Formoterol (BEVESPI AEROSPHERE) 9-4.8 MCG/ACT AERO Inhale 2 puffs into the lungs 2 (two) times daily. 02/07/16   [name], MD  Insulin Glargine (LANTUS SOLOSTAR) 100 UNIT/ML Solostar Pen INJECT 55 UNITS SUBCUTANEOUSLY EVERY MORNING AND 60  UNITS EVERY EVENING 08/25/16   Elayne Snare, MD  irbesartan (AVAPRO) 300 MG tablet take 1 tablet by mouth once daily 03/15/16   [name], MD  NOVOLOG FLEXPEN 100 UNIT/ML FlexPen inject 20 to 25 units subcutaneously three times a day with meals 08/18/16   Elayne Snare, MD  pioglitazone (ACTOS) 30 MG tablet take 1 tablet by mouth once daily 09/05/16   Elayne Snare, MD  pravastatin (PRAVACHOL) 40 MG tablet take 1 tablet by mouth every evening 07/25/16   Elayne Snare, MD  Suvorexant (BELSOMRA) 20 MG TABS Take 1 tablet by mouth at bedtime as needed. 08/22/16   [name], MD  trazodone (DESYREL) 300 MG tablet Take 1 tablet (300 mg total) by mouth at bedtime. 02/01/16   [name], MD  triamcinolone cream (KENALOG) 0.5 % APPLY 1 APPLICATION TOPICALLY THREE TIME A DAY 08/23/15   [name], MD    Family History Family History  Problem Relation Age of Onset  . Arthritis Mother   . Diabetes Mother   . Heart disease Mother   . Stroke Mother   . Arthritis Father   . Diabetes Father   . Heart disease Father     Social History Social History  Substance Use Topics  . Smoking status: Current Some Day Smoker    Packs/day: 0.25    Types: Cigarettes    Last attempt to quit: 01/09/2012  . Smokeless tobacco: Never Used  . Alcohol use No     Comment: socially     Allergies   Benazepril and Glipizide   Review of Systems Review of Systems ROS reviewed and all are negative for acute change except as noted in the HPI.  Physical Exam Updated Vital Signs BP (!) 188/94 (BP Location: Right Arm)   Pulse 81   Temp 98.4 F (36.9 C) (Oral)   Resp 16   SpO2 98%   Physical Exam  Constitutional: He is oriented to person, place, and time. Vital signs are normal. He appears well-developed and well-nourished.  HENT:  Head: Normocephalic and atraumatic.  Right Ear: Hearing normal.  Left Ear: Hearing normal.  Eyes: Pupils are equal, round, and reactive to light. Conjunctivae and EOM are  normal.  Neck: Normal range of motion. Neck supple.  Cardiovascular: Normal rate, regular rhythm, normal heart sounds and intact distal pulses.   Pulmonary/Chest: Effort normal and breath sounds normal.  Abdominal: Soft. Normal appearance and bowel sounds are normal. There is no tenderness. There is no rigidity, no rebound, no guarding, no CVA tenderness, no tenderness at McBurney's point and negative Murphy's sign.  Abdomen soft  Genitourinary:  Genitourinary Comments: Chaperone was present. Patient with no pain around the rectal area. There are no external fissures noted. No induration of the skin or swelling. No external hemorrhoids seen. Patient able to tolerate examination. I was  able to feel the first 2-3cm of the rectum digitally without gross abnormality. There is no gross blood.  NO signs of perirectal abscess.  Musculoskeletal: Normal range of motion.  Neurological: He is alert and oriented to person, place, and time.  Skin: Skin is warm and dry.  Psychiatric: He has a normal mood and affect. His speech is normal and behavior is normal. Thought content normal.  Nursing note and vitals reviewed.  ED Treatments / Results  Labs (all labs ordered are listed, but only abnormal results are displayed) Labs Reviewed  CBC - Abnormal; Notable for the following:       Result Value   WBC 11.0 (*)    Hemoglobin 17.3 (*)    MCHC 36.7 (*)    All other components within normal limits  COMPREHENSIVE METABOLIC PANEL - Abnormal; Notable for the following:    Sodium 132 (*)    Chloride 89 (*)    Glucose, Bld 331 (*)    Total Protein 9.1 (*)    All other components within normal limits  LIPASE, BLOOD    EKG  EKG Interpretation None      Radiology Dg Abdomen 1 View  Result Date: 09/11/2016 CLINICAL DATA:  Abdominal pain.  Constipation. EXAM: ABDOMEN - 1 VIEW COMPARISON:  None. FINDINGS: No dilated small bowel loops. Mild gas and stool in the large bowel. No evidence of pneumatosis or  pneumoperitoneum. No radiopaque urolithiasis. Mild lumbar spondylosis. IMPRESSION: Nonobstructive bowel gas pattern.  Mild colonic stool burden. Electronically Signed   By: Ilona Sorrel M.D.   On: 09/11/2016 14:28    Procedures Procedures (including critical care time)  Medications Ordered in ED Medications - No data to display   Initial Impression / Assessment and Plan / ED Course  I have reviewed the triage vital signs and the nursing notes.  Pertinent labs & imaging results that were available during my care of the patient were reviewed by me and considered in my medical decision making (see chart for details).  Final Clinical Impressions(s) / ED Diagnoses   {I have reviewed and evaluated the relevant imaging studies.  {I have reviewed the relevant previous healthcare records.  {I obtained HPI from historian.   ED Course:  Assessment: Pt is a 61 y.o. male with a hx of DM, HIV, HTN, HLD, presents to the Emergency Department today due to feeling constipated all weekend. Pt states that he has had liquid "soft" stool, but no formed stool. Notes mild abdominal discomfort. Denies pain currently. No N/V/D. Passing flatus. No CP/SOB. States symptoms x 1 week. Notes attempting Miralax, Dolculax, Fleet Enemas, Magnesium Citrate with effect, but patient states that he still feels "something in there." Notes no straining. No pain with bowel movements. No fevers. On exam, pt in NAD. Nontoxic/nonseptic appearing. VSS. Afebrile. Lungs CTA. Heart RRR. Abdomen nontender soft. DRE unremarkable. CBC unremarkable. CMP unremarkable. KUB with mild stool burden. No SBO. Encouraged fluids and increase fiber. Plan is to DC home with follow up to PCP. At time of discharge, Patient is in no acute distress. Vital Signs are stable. Patient is able to ambulate. Patient able to tolerate PO.   Disposition/Plan:  DC Home Additional Verbal discharge instructions given and discussed with patient.  Pt Instructed to f/u with  PCP in the next week for evaluation and treatment of symptoms. Return precautions given Pt acknowledges and agrees with plan  Supervising Physician Lacretia Leigh, MD  Final diagnoses:  Constipation, unspecified constipation type    New  Prescriptions New Prescriptions   No medications on file     Shary Decamp, Hershal Coria 09/11/16 1519    Lacretia Leigh, MD 09/14/16 1407

## 2016-09-11 NOTE — Discharge Instructions (Signed)
Please read and follow all provided instructions.  Your diagnoses today include:  1. Constipation, unspecified constipation type     Tests performed today include: Vital signs. See below for your results today.   Medications prescribed:  Take as prescribed   Home care instructions:  Follow any educational materials contained in this packet.  Follow-up instructions: Please follow-up with your primary care provider for further evaluation of symptoms and treatment   Return instructions:  Please return to the Emergency Department if you do not get better, if you get worse, or new symptoms OR  - Fever (temperature greater than 101.47F)  - Bleeding that does not stop with holding pressure to the area    -Severe pain (please note that you may be more sore the day after your accident)  - Chest Pain  - Difficulty breathing  - Severe nausea or vomiting  - Inability to tolerate food and liquids  - Passing out  - Skin becoming red around your wounds  - Change in mental status (confusion or lethargy)  - New numbness or weakness    Please return if you have any other emergent concerns.  Additional Information:  Your vital signs today were: BP (!) 188/94 (BP Location: Right Arm)    Pulse 81    Temp 98.4 F (36.9 C) (Oral)    Resp 16    SpO2 98%  If your blood pressure (BP) was elevated above 135/85 this visit, please have this repeated by your doctor within one month. ---------------

## 2016-09-11 NOTE — ED Notes (Signed)
Patient transported to X-ray 

## 2016-09-11 NOTE — ED Triage Notes (Signed)
Pt states that he has been constipated all weekend but he has had liquid stool all weekend- no formed stool. Alert and oriented. Denies abdominal pain.

## 2016-09-14 ENCOUNTER — Other Ambulatory Visit: Payer: Self-pay | Admitting: *Deleted

## 2016-09-14 NOTE — Patient Outreach (Signed)
[facility]) Care Management  [facility] Social Work  09/14/2016  Nicholas Whitehead 1955-01-29 [id number]  Subjective:  "I was in the emergency room for constipation".  Objective: CSW to assist patient with commuity based resources to aide in his well-being, quality of life and overall safety/needs.    Current Medications:  Current Outpatient Prescriptions  Medication Sig Dispense Refill  . ACCU-CHEK FASTCLIX LANCETS MISC USE  TO CHECK BLOOD SUGAR THREE TIMES DAILY 306 each 1  . aspirin EC 81 MG tablet Take 81 mg by mouth every morning.    . B-D ULTRAFINE III SHORT PEN 31G X 8 MM MISC use as directed five times a day 300 each 3  . BYSTOLIC 10 MG tablet take 2 tablets by mouth daily 60 tablet 11  . cetirizine (ZYRTEC) 10 MG tablet Take 10 mg by mouth daily.    . chlorthalidone (HYGROTON) 25 MG tablet Take 1 tablet (25 mg total) by mouth daily. 90 tablet 1  . clonazePAM (KLONOPIN) 1 MG tablet take 1 tablet by mouth twice a day if needed for anxiety 60 tablet 5  . COMPLERA 200-25-300 MG tablet take 1 tablet by mouth once daily 30 tablet 4  . docusate sodium (COLACE) 100 MG capsule Take 100 mg by mouth every morning.    . DULoxetine (CYMBALTA) 60 MG capsule Take 1 capsule (60 mg total) by mouth daily. 90 capsule 3  . gabapentin (NEURONTIN) 300 MG capsule take 1 capsule by mouth three times a day 90 capsule 3  . glucose blood (ACCU-CHEK GUIDE) test strip Use to check blood sugar three times per day. 200 each 2  . Glycopyrrolate-Formoterol (BEVESPI AEROSPHERE) 9-4.8 MCG/ACT AERO Inhale 2 puffs into the lungs 2 (two) times daily. 10.7 g 11  . Insulin Glargine (LANTUS SOLOSTAR) 100 UNIT/ML Solostar Pen INJECT 55 UNITS SUBCUTANEOUSLY EVERY MORNING AND 60 UNITS EVERY EVENING 30 mL 0  . irbesartan (AVAPRO) 300 MG tablet take 1 tablet by mouth once daily 90 tablet 3  . NOVOLOG FLEXPEN 100 UNIT/ML FlexPen inject 20 to 25 units subcutaneously three times a day with meals 15 mL 1  .  pioglitazone (ACTOS) 30 MG tablet take 1 tablet by mouth once daily 30 tablet 0  . pravastatin (PRAVACHOL) 40 MG tablet take 1 tablet by mouth every evening 90 tablet 0  . Suvorexant (BELSOMRA) 20 MG TABS Take 1 tablet by mouth at bedtime as needed. 30 tablet 5  . trazodone (DESYREL) 300 MG tablet Take 1 tablet (300 mg total) by mouth at bedtime. 90 tablet 3  . triamcinolone cream (KENALOG) 0.5 % APPLY 1 APPLICATION TOPICALLY THREE TIME A DAY 100 g 1   No current facility-administered medications for this visit.     Functional Status:  In your present state of health, do you have any difficulty performing the following activities: 08/22/2016 06/27/2016  Hearing? N N  Vision? N N  Difficulty concentrating or making decisions? N N  Walking or climbing stairs? Y Y  Dressing or bathing? N N  Doing errands, shopping? N N  Preparing Food and eating ? N -  Using the Toilet? N -  In the past six months, have you accidently leaked urine? N -  Do you have problems with loss of bowel control? N -  Managing your Medications? N -  Managing your Finances? N -  Housekeeping or managing your Housekeeping? N -  Some recent data might be hidden    Fall/Depression Screening:  Fall Risk  08/22/2016 06/27/2016 12/23/2015  Falls in the past year? No No No  Number falls in past yr: - - -  Injury with Fall? - - -  Risk Factor Category  - - -  Risk for fall due to : Impaired balance/gait Impaired balance/gait Impaired balance/gait   PHQ 2/9 Scores 08/30/2016 08/22/2016 06/27/2016 12/23/2015 09/22/2014 05/14/2014 12/25/2013  PHQ - 2 Score 2 4 2  0 1 1 4   PHQ- 9 Score - 12 3 - - - 16    Assessment: CSW made initial contact with patient today to introduce self, role and reason for call; to link with community resources and other support as identified . He reports having [employer], gets SSI Disability and food stamps. Patient does not think that he receives Medicaid however if on SSI he should be eligible.  CSW plans to investigate this and contact patient in the next 10 days to advise and further determine his interests and needs for support/services.   Plan: CSW will inquire with DSS about his Medicaid status and eligibility as well as gather other resources/benefits to share.    Eduard Clos, MSW, [place] Worker  [place] [phone]

## 2016-09-15 ENCOUNTER — Other Ambulatory Visit: Payer: Self-pay | Admitting: *Deleted

## 2016-09-16 ENCOUNTER — Other Ambulatory Visit: Payer: Self-pay | Admitting: Internal Medicine

## 2016-09-16 DIAGNOSIS — F32A Depression, unspecified: Secondary | ICD-10-CM

## 2016-09-16 DIAGNOSIS — F329 Major depressive disorder, single episode, unspecified: Secondary | ICD-10-CM

## 2016-09-16 DIAGNOSIS — F419 Anxiety disorder, unspecified: Secondary | ICD-10-CM

## 2016-09-16 DIAGNOSIS — F411 Generalized anxiety disorder: Secondary | ICD-10-CM

## 2016-09-18 ENCOUNTER — Other Ambulatory Visit: Payer: Self-pay | Admitting: *Deleted

## 2016-09-18 NOTE — Telephone Encounter (Signed)
Faxed script back to [facility].../LM,B

## 2016-09-18 NOTE — Patient Outreach (Signed)
[facility]) Care Management  09/18/2016  Tycen Dockter 08/16/1955 [id number]   CSW updated patient about DSS indicating he does not have Medicaid. Still uncertain if he may be eligible for some sort of Medicaid assistance as he reports he is on SSI.  CSW will investigate further and plan f/u call to patient in 2 weeks.  Eduard Clos, MSW, [place] Worker  [place] [phone]

## 2016-09-20 NOTE — Patient Outreach (Signed)
Request received from Janet Caldwell, LCSW to mail patient personal care resources.  Information mailed today. 

## 2016-10-02 ENCOUNTER — Other Ambulatory Visit: Payer: Self-pay | Admitting: *Deleted

## 2016-10-03 ENCOUNTER — Other Ambulatory Visit: Payer: Self-pay | Admitting: *Deleted

## 2016-10-05 ENCOUNTER — Other Ambulatory Visit: Payer: Self-pay | Admitting: *Deleted

## 2016-10-05 ENCOUNTER — Ambulatory Visit: Payer: Self-pay | Admitting: *Deleted

## 2016-10-05 DIAGNOSIS — Z794 Long term (current) use of insulin: Secondary | ICD-10-CM

## 2016-10-05 DIAGNOSIS — B20 Human immunodeficiency virus [HIV] disease: Secondary | ICD-10-CM

## 2016-10-05 DIAGNOSIS — I10 Essential (primary) hypertension: Secondary | ICD-10-CM

## 2016-10-05 DIAGNOSIS — E0800 Diabetes mellitus due to underlying condition with hyperosmolarity without nonketotic hyperglycemic-hyperosmolar coma (NKHHC): Secondary | ICD-10-CM

## 2016-10-05 NOTE — Patient Outreach (Addendum)
[facility]) Care Management  10/05/2016   Nicholas Whitehead 05/19/1955 [id number]  RN Health Coach telephone call to patient.  [place] compliance verified. Patient fasting blood sugar 231.  Patient A1C was 9.7. Patient does not know the symptoms of hypo or hyperglycemia.  Patient stated he had stopped all his medications except for the 042 medication and insulin. Per patient once he went to ER for constipation he didn't take his other medications once he left Emergency room.  Patient stated that he is afraid that his medications will make him constipated. Per patient he is having a lot of problems with depression and insomnia. Per patient his counselor has left and at this time he does not have a Social worker. Patient has been referred to Education officer, museum.  Per patient he has not been adhering to his diet as he should. Per patient he stated that he is not able to afford the healthy foods.Patient was very emotional stating that since his parents had died he is alone in his care. Patient has agreed to follow up outreach calls.     Current Medications:  Current Outpatient Prescriptions  Medication Sig Dispense Refill  . ACCU-CHEK FASTCLIX LANCETS MISC USE  TO CHECK BLOOD SUGAR THREE TIMES DAILY 306 each 1  . B-D ULTRAFINE III SHORT PEN 31G X 8 MM MISC use as directed five times a day 300 each 3  . COMPLERA 200-25-300 MG tablet take 1 tablet by mouth once daily 30 tablet 4  . glucose blood (ACCU-CHEK GUIDE) test strip Use to check blood sugar three times per day. 200 each 2  . Insulin Glargine (LANTUS SOLOSTAR) 100 UNIT/ML Solostar Pen INJECT 55 UNITS SUBCUTANEOUSLY EVERY MORNING AND 60 UNITS EVERY EVENING 30 mL 0  . NOVOLOG FLEXPEN 100 UNIT/ML FlexPen inject 20 to 25 units subcutaneously three times a day with meals 15 mL 1  . triamcinolone cream (KENALOG) 0.5 % APPLY 1 APPLICATION TOPICALLY THREE TIME A DAY 100 g 1  . aspirin EC 81 MG tablet Take 81 mg by mouth every morning.    Marland Kitchen BYSTOLIC  10 MG tablet take 2 tablets by mouth daily (Patient not taking: Reported on 10/05/2016) 60 tablet 11  . cetirizine (ZYRTEC) 10 MG tablet Take 10 mg by mouth daily.    . chlorthalidone (HYGROTON) 25 MG tablet Take 1 tablet (25 mg total) by mouth daily. (Patient not taking: Reported on 10/05/2016) 90 tablet 1  . clonazePAM (KLONOPIN) 1 MG tablet take 1 tablet by mouth twice a day if needed for anxiety (Patient not taking: Reported on 10/05/2016) 60 tablet 5  . docusate sodium (COLACE) 100 MG capsule Take 100 mg by mouth every morning.    . DULoxetine (CYMBALTA) 60 MG capsule Take 1 capsule (60 mg total) by mouth daily. (Patient not taking: Reported on 10/05/2016) 90 capsule 3  . gabapentin (NEURONTIN) 300 MG capsule take 1 capsule by mouth three times a day (Patient not taking: Reported on 10/05/2016) 90 capsule 3  . Glycopyrrolate-Formoterol (BEVESPI AEROSPHERE) 9-4.8 MCG/ACT AERO Inhale 2 puffs into the lungs 2 (two) times daily. (Patient not taking: Reported on 10/05/2016) 10.7 g 11  . irbesartan (AVAPRO) 300 MG tablet take 1 tablet by mouth once daily (Patient not taking: Reported on 10/05/2016) 90 tablet 3  . pioglitazone (ACTOS) 30 MG tablet take 1 tablet by mouth once daily (Patient not taking: Reported on 10/05/2016) 30 tablet 0  . pravastatin (PRAVACHOL) 40 MG tablet take 1 tablet by mouth every evening (Patient  not taking: Reported on 10/05/2016) 90 tablet 0  . Suvorexant (BELSOMRA) 20 MG TABS Take 1 tablet by mouth at bedtime as needed. (Patient not taking: Reported on 10/05/2016) 30 tablet 5  . trazodone (DESYREL) 300 MG tablet Take 1 tablet (300 mg total) by mouth at bedtime. (Patient not taking: Reported on 10/05/2016) 90 tablet 3   No current facility-administered medications for this visit.     Functional Status:  In your present state of health, do you have any difficulty performing the following activities: 10/05/2016 08/22/2016  Hearing? N N  Vision? Y N  Comment per patient his eyes get  tired and it is hard to focus -  Difficulty concentrating or making decisions? Y N  Walking or climbing stairs? N Y  Dressing or bathing? N N  Doing errands, shopping? N N  Preparing Food and eating ? N N  Using the Toilet? N N  In the past six months, have you accidently leaked urine? N N  Do you have problems with loss of bowel control? N N  Managing your Medications? Y N  Managing your Finances? N N  Housekeeping or managing your Housekeeping? N N  Some recent data might be hidden    Fall/Depression Screening: Fall Risk  10/05/2016 08/22/2016 06/27/2016  Falls in the past year? No No No  Number falls in past yr: - - -  Injury with Fall? - - -  Risk Factor Category  - - -  Risk for fall due to : - Impaired balance/gait Impaired balance/gait   PHQ 2/9 Scores 10/05/2016 08/30/2016 08/22/2016 06/27/2016 12/23/2015 09/22/2014 05/14/2014  PHQ - 2 Score 2 2 4 2  0 1 1  PHQ- 9 Score 10 - 12 3 - - -   THN CM Care Plan Problem One     Most Recent Value  Care Plan Problem One  Knowledge Deficit in self management of Diabetes  Role Documenting the Problem One  [place] for Problem One  Active  THN Long Term Goal   Patient will see A1C decrease with next blood draw within the next 90 days  THN Long Term Goal Start Date  10/05/16  Interventions for Problem One Long Term Goal  RN discussed fasting blood sugar levels to show a decrease in the A1C. RN sent patient educational material on living well with diabetes   THN CM Short Term Goal #1   Patient will know the signs and symptoms of hypo and hyperglycemia within the next 30 days  THN CM Short Term Goal #1 Start Date  10/05/16  Interventions for Short Term Goal #1  RN discussed signs and symptoms of hypo and hyperglycemia. RN sent educational material of pictures of hypo and hyperglycemia. RN sent action plan for symptoms. RN will follow up discussion of teachback  THN CM Short Term Goal #2   Patient will document blood sugars daily  within the next 30 days  THN CM Short Term Goal #2 Start Date  10/05/16  Interventions for Short Term Goal #2  RN discussed documenting blood sugars. RN sent a calendar book to document blood sugars  THN CM Short Term Goal #3  Patient will  report medication adherence within the next 30 days  THN CM Short Term Goal #3 Start Date  10/05/16  Interventions for Short Tern Goal #3  RN discussed the importance of taking medications as prescribed. RN referred to pharmacy for patient explanation of medications.  Assessment:  Fasting blood sugar is 231 A1C 9.7 Patient will benefit from Health Coach telephonic outreach for education and support for diabetes self management.  Plan RN referred to pharmancy RN discussed hypo and hypoglycemia reactions RN sent picture educational material of hypo and hyperglycemia RN sent calendar book for documentation Patient will follow up with next doctor visit that he cancelled RN will follow up within the month of October for further discussion and teach back  [place] Management [phone]

## 2016-10-06 ENCOUNTER — Encounter: Payer: Self-pay | Admitting: *Deleted

## 2016-10-07 ENCOUNTER — Other Ambulatory Visit: Payer: Self-pay | Admitting: *Deleted

## 2016-10-07 NOTE — Patient Outreach (Signed)
[place] [facility]) Care Management  [facility] Social Work  10/07/2016  Nicholas Whitehead October 05, 1955 [id number]  Subjective:  "I am depressed"  Objective:  CSW to assist patient with commuity based resources to aide in his well-being, quality of life and overall safety/needs.   Current Medications:  Current Outpatient Prescriptions  Medication Sig Dispense Refill  . ACCU-CHEK FASTCLIX LANCETS MISC USE  TO CHECK BLOOD SUGAR THREE TIMES DAILY 306 each 1  . aspirin EC 81 MG tablet Take 81 mg by mouth every morning.    . B-D ULTRAFINE III SHORT PEN 31G X 8 MM MISC use as directed five times a day 300 each 3  . BYSTOLIC 10 MG tablet take 2 tablets by mouth daily (Patient not taking: Reported on 10/05/2016) 60 tablet 11  . cetirizine (ZYRTEC) 10 MG tablet Take 10 mg by mouth daily.    . chlorthalidone (HYGROTON) 25 MG tablet Take 1 tablet (25 mg total) by mouth daily. (Patient not taking: Reported on 10/05/2016) 90 tablet 1  . clonazePAM (KLONOPIN) 1 MG tablet take 1 tablet by mouth twice a day if needed for anxiety (Patient not taking: Reported on 10/05/2016) 60 tablet 5  . COMPLERA 200-25-300 MG tablet take 1 tablet by mouth once daily 30 tablet 4  . docusate sodium (COLACE) 100 MG capsule Take 100 mg by mouth every morning.    . DULoxetine (CYMBALTA) 60 MG capsule Take 1 capsule (60 mg total) by mouth daily. (Patient not taking: Reported on 10/05/2016) 90 capsule 3  . gabapentin (NEURONTIN) 300 MG capsule take 1 capsule by mouth three times a day (Patient not taking: Reported on 10/05/2016) 90 capsule 3  . glucose blood (ACCU-CHEK GUIDE) test strip Use to check blood sugar three times per day. 200 each 2  . Glycopyrrolate-Formoterol (BEVESPI AEROSPHERE) 9-4.8 MCG/ACT AERO Inhale 2 puffs into the lungs 2 (two) times daily. (Patient not taking: Reported on 10/05/2016) 10.7 g 11  . Insulin Glargine (LANTUS SOLOSTAR) 100 UNIT/ML Solostar Pen INJECT 55 UNITS SUBCUTANEOUSLY EVERY MORNING AND 60  UNITS EVERY EVENING 30 mL 0  . irbesartan (AVAPRO) 300 MG tablet take 1 tablet by mouth once daily (Patient not taking: Reported on 10/05/2016) 90 tablet 3  . NOVOLOG FLEXPEN 100 UNIT/ML FlexPen inject 20 to 25 units subcutaneously three times a day with meals 15 mL 1  . pioglitazone (ACTOS) 30 MG tablet take 1 tablet by mouth once daily (Patient not taking: Reported on 10/05/2016) 30 tablet 0  . pravastatin (PRAVACHOL) 40 MG tablet take 1 tablet by mouth every evening (Patient not taking: Reported on 10/05/2016) 90 tablet 0  . Suvorexant (BELSOMRA) 20 MG TABS Take 1 tablet by mouth at bedtime as needed. (Patient not taking: Reported on 10/05/2016) 30 tablet 5  . trazodone (DESYREL) 300 MG tablet Take 1 tablet (300 mg total) by mouth at bedtime. (Patient not taking: Reported on 10/05/2016) 90 tablet 3  . triamcinolone cream (KENALOG) 0.5 % APPLY 1 APPLICATION TOPICALLY THREE TIME A DAY 100 g 1   No current facility-administered medications for this visit.     Functional Status:  In your present state of health, do you have any difficulty performing the following activities: 10/05/2016 08/22/2016  Hearing? N N  Vision? Y N  Comment per patient his eyes get tired and it is hard to focus -  Difficulty concentrating or making decisions? Y N  Walking or climbing stairs? N Y  Dressing or bathing? N N  Doing errands, shopping? N  N  Preparing Food and eating ? N N  Using the Toilet? N N  In the past six months, have you accidently leaked urine? N N  Do you have problems with loss of bowel control? N N  Managing your Medications? Y N  Managing your Finances? N N  Housekeeping or managing your Housekeeping? N N  Some recent data might be hidden    Fall/Depression Screening:  Fall Risk  10/05/2016 08/22/2016 06/27/2016  Falls in the past year? No No No  Number falls in past yr: - - -  Injury with Fall? - - -  Risk Factor Category  - - -  Risk for fall due to : - Impaired balance/gait Impaired  balance/gait   PHQ 2/9 Scores 10/07/2016 10/05/2016 08/30/2016 08/22/2016 06/27/2016 12/23/2015 09/22/2014  PHQ - 2 Score 3 2 2 4 2  0 1  PHQ- 9 Score 13 10 - 12 3 - -    Assessment:  CSW received a return call from patient and was able to connect and complete assessment. Patient voluntarily admitted to [place], "I am depressed", and was willing to talk at length with CSW about this. Patient reports feeling very lonely, isolated and "20 years of dealing with my diagnosis".  CSW was able to complete depression screening which did show significant signs/symptoms of depression. He reports his depression currently being a "6" out of 10 (10 being the worst) and over the past 2 weeks, average being "8".  He denies current or past history of SI/HI. He has been to several counselor/therapists but has been "gunshy" and had trouble feeling comfortable/trusting.  He is open to considering/pursuing therapy again and CSW plans to link him with some options.  Patient reports he was taking Cymbalta for depression but stopped taking all his meds 9/3 because of constipation- "I wanted to get off anything that was maybe causing the constipation". He reports he has now back on his Insulin and Cymbalta and other RX but avoiding narcotics.  CSW educated and encouraged him on the importance of taking his prescribed meds daily as ordered; including conversation about not abruptly stopping the anti-depressant. He reports being back on the Cymbalta and is encouraged to take this daily. CSW will ask [facility] RPH to reach out to patient also regarding his RX's for management, education, etc.   Patient identifies his brother as a support.  He is connected with [facility] .    Plan:  [facility] CM Care Plan Problem One     Most Recent Value  Care Plan Problem One  Patient reports depression.  Role Documenting the Problem One  Clinical Social Worker  Care Plan for Problem One  Active  [facility] Long Term Goal    Patient will report less  signs/symptoms of depression in the next 90 days. [Patient will report less signs/symptoms]  THN Long Term Goal Start Date  10/06/16  Interventions for Problem One Long Term Goal  CSW educating and linking patient with services to assist with depression .   THN CM Short Term Goal #1   Patient will report daily intake of his anti-depressant over the next 30 days.  THN CM Short Term Goal #1 Start Date  10/06/16  Interventions for Short Term Goal #1  CSW discussed importantce of taking antidepressant as prescribed and encouraged daily intake.   THN CM Short Term Goal #2   Patient will report receipt of resources for counseling and will secure an appointment in the next 30 days.  THN CM Short Term Goal #2 Start Date  10/06/16  Interventions for Short Term Goal #2  CSW will provide and encourage patient to schedule and participate in therapy for support related to his depression.  THN CM Short Term Goal #3     Interventions for Short Tern Goal #3         CSW will plan f/u call next week.   Eduard Clos, MSW, [place] Worker  [place] [phone]

## 2016-10-09 ENCOUNTER — Other Ambulatory Visit: Payer: Self-pay | Admitting: *Deleted

## 2016-10-09 ENCOUNTER — Other Ambulatory Visit: Payer: Self-pay | Admitting: Endocrinology

## 2016-10-09 NOTE — Telephone Encounter (Signed)
Routing...

## 2016-10-09 NOTE — Patient Outreach (Signed)
[facility]) Care Management  10/09/2016  Nicholas Whitehead Jun 08, 1955 [id number]   CSW contacted patient today who reports he "blacked out" over the weekend. He believes it is because he started taking his Trazadone again. "I think I took too much- took a whole pill". He now is taking half a pill. He reports feeling "surprisngly better"- reports his depression/sadness is lessened- "I am not dwelling on it"  and plans to go for "my walk".  CSW offered support and encouragement; and to take his medications as prescribed.     [facility] CM Care Plan Problem One     Most Recent Value  Care Plan Problem One  Patient reports depression.  Role Documenting the Problem One  Clinical Social Worker  Care Plan for Problem One  Active  [facility] Long Term Goal    Patient will report less signs/symptoms of depression in the next 90 days. [Patient will report less signs/symptoms]  THN Long Term Goal Start Date  10/06/16  Interventions for Problem One Long Term Goal  CSW educating and linking patient with services to assist with depression .   THN CM Short Term Goal #1   Patient will report daily intake of his anti-depressant over the next 30 days.  THN CM Short Term Goal #1 Start Date  10/06/16  Interventions for Short Term Goal #1  CSW discussed importantce of taking antidepressant as prescribed and encouraged daily intake.   THN CM Short Term Goal #2   Patient will report receipt of resources for counseling and will secure an appointment in the next 30 days.  THN CM Short Term Goal #2 Start Date  10/06/16  Interventions for Short Term Goal #2  CSW will provide and encourage patient to schedule and participate in therapy for support related to his depression.  THN CM Short Term Goal #3     Interventions for Short Tern Goal #3        Plan f/u later this week with mental health support options.   Eduard Clos, MSW, [place] Worker  [place] [phone]

## 2016-10-11 ENCOUNTER — Telehealth: Payer: Self-pay | Admitting: Pharmacist

## 2016-10-11 NOTE — Patient Outreach (Signed)
[place] [facility]) Care Management  10/11/2016  Nicholas Whitehead [birth year] [id number]   Patient was called regarding medication management. Unfortunately, both numbers listed in the patient's file did not work. Recordings came on for both numbers saying either the person was unavailable or that the number was no longer valid.  Plan:  Call patient back in 3-5 business days Leave a message for [place] Worker, Eduard Clos   Elayne Guerin, PharmD, [place] Clinical Pharmacist [phone]

## 2016-10-13 ENCOUNTER — Other Ambulatory Visit: Payer: Self-pay | Admitting: Pharmacist

## 2016-10-13 ENCOUNTER — Other Ambulatory Visit: Payer: Self-pay | Admitting: *Deleted

## 2016-10-13 ENCOUNTER — Encounter: Payer: Self-pay | Admitting: Pharmacist
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
## 2016-10-13 NOTE — Patient Outreach (Signed)
[facility]) Care Management  [place]   10/13/2016  Nicholas Whitehead [birth year] [id number]  Subjective: Called patient regarding medication management per referral.  Patient is a 61 year old male with multiple medical conditions including but not limited to:  Anxiety, depression, DJD of shoulder, HIV, hypertension, type 2 diabetes with secondary neuropathy.  Patient reported managing his medications on his own.  He does not use a pill box.  Patient reported he stopped taking most of his medications last month due to experiencing constipation with Hydrocodone/Acetaminophen.  Patient went to the ED 09/11/16 for constipation.  Objective:    Encounter Medications: Outpatient Encounter Prescriptions as of 10/13/2016  Medication Sig Note  . ACCU-CHEK FASTCLIX LANCETS MISC USE  TO CHECK BLOOD SUGAR THREE TIMES DAILY   . B-D ULTRAFINE III SHORT PEN 31G X 8 MM MISC use as directed five times a day   . BYSTOLIC 10 MG tablet take 2 tablets by mouth daily   . COMPLERA 200-25-300 MG tablet take 1 tablet by mouth once daily   . DULoxetine (CYMBALTA) 60 MG capsule Take 1 capsule (60 mg total) by mouth daily.   Marland Kitchen gabapentin (NEURONTIN) 300 MG capsule take 1 capsule by mouth three times a day   . glucose blood (ACCU-CHEK GUIDE) test strip Use to check blood sugar three times per day.   . Glycopyrrolate-Formoterol (BEVESPI AEROSPHERE) 9-4.8 MCG/ACT AERO Inhale 2 puffs into the lungs 2 (two) times daily.   . Insulin Glargine (LANTUS SOLOSTAR) 100 UNIT/ML Solostar Pen INJECT 55 UNITS SUBCUTANEOUSLY EVERY MORNING AND 60 UNITS EVERY EVENING (Patient taking differently: INJECT 65 UNITS SUBCUTANEOUSLY EVERY MORNING AND 60 UNITS EVERY EVENING)   . irbesartan (AVAPRO) 300 MG tablet take 1 tablet by mouth once daily   . NOVOLOG FLEXPEN 100 UNIT/ML FlexPen inject 20 to 25 units subcutaneously three times a day with meals   . pravastatin (PRAVACHOL) 40 MG tablet take 1 tablet by mouth every  evening   . trazodone (DESYREL) 300 MG tablet Take 1 tablet (300 mg total) by mouth at bedtime. 10/13/2016: Taking 1/2 tablet at bedtime  . triamcinolone cream (KENALOG) 0.5 % APPLY 1 APPLICATION TOPICALLY THREE TIME A DAY   . aspirin EC 81 MG tablet Take 81 mg by mouth every morning. 10/13/2016: Ran out due to cost  . cetirizine (ZYRTEC) 10 MG tablet Take 10 mg by mouth daily. 10/13/2016: Ran out due to cost   . chlorthalidone (HYGROTON) 25 MG tablet Take 1 tablet (25 mg total) by mouth daily. (Patient not taking: Reported on 10/13/2016)   . docusate sodium (COLACE) 100 MG capsule Take 100 mg by mouth every morning. 10/13/2016: Has not taken due to cost  . [DISCONTINUED] clonazePAM (KLONOPIN) 1 MG tablet take 1 tablet by mouth twice a day if needed for anxiety (Patient not taking: Reported on 10/05/2016)   . [DISCONTINUED] pioglitazone (ACTOS) 30 MG tablet take 1 tablet by mouth once daily (Patient not taking: Reported on 10/05/2016)   . [DISCONTINUED] Suvorexant (BELSOMRA) 20 MG TABS Take 1 tablet by mouth at bedtime as needed. (Patient not taking: Reported on 10/05/2016)    No facility-administered encounter medications on file as of 10/13/2016.     Functional Status: In your present state of health, do you have any difficulty performing the following activities: 10/05/2016 08/22/2016  Hearing? N N  Vision? Y N  Comment per patient his eyes get tired and it is hard to focus -  Difficulty concentrating or making decisions?  Y N  Walking or climbing stairs? N Y  Dressing or bathing? N N  Doing errands, shopping? N N  Preparing Food and eating ? N N  Using the Toilet? N N  In the past six months, have you accidently leaked urine? N N  Do you have problems with loss of bowel control? N N  Managing your Medications? Y N  Managing your Finances? N N  Housekeeping or managing your Housekeeping? N N  Some recent data might be hidden    Fall/Depression Screening: Fall Risk  10/13/2016 10/05/2016  08/22/2016  Falls in the past year? Yes No No  Comment "blacked out"  going to bathroom - -  Number falls in past yr: 1 - -  Injury with Fall? No - -  Risk Factor Category  - - -  Risk for fall due to : History of fall(s);Medication side effect - Impaired balance/gait  Follow up Falls prevention discussed;Education provided - -   PHQ 2/9 Scores 10/13/2016 10/07/2016 10/05/2016 08/30/2016 08/22/2016 06/27/2016 12/23/2015  PHQ - 2 Score 1 3 2 2 4 2  0  PHQ- 9 Score 3 13 10  - 12 3 -      Assessment: Patient's medications were reviewed via telephone.   Drugs sorted by system:  Neurologic/Psychologic: Trazodone Duloxetine Gabapentin   Cardiovascular Aspirin Chlorthalidone Irbesartan Nebivolol Pravastatin   Pulmonary/Allergy: Glycopyrrolate Fomoterol Cetirizine (ran out of but ordered during the call from [facility])  Gastrointestinal: Docusate   Endocrine: Lantus Novolog  Topical: Triamcinolone Cream  Pain:  Vitamins/Minerals: Multiple Vitamin  Infectious Diseases: Complera   Medication Review Findings:  Adherence: Patient admitted to stopping all of his medications with the exception of Complera right after his ED visit for constipation in an effort to "clean out my system". He confirmed he started taking all medications as prescribed about 2 weeks ago with the exception of Chlorthalidone but said he would start chlorthalidone today.  Aspirin-patient was out of aspirin due to cost he said a friend was going to purchase some for him. Patient has [employer] and has an OTC benefit as part of his coverage.  [employer] was called and it was confirmed patient has $30 per quarter to spend on OTC products. Patient was unaware of this benefit and had not used it all year.  Aspirin, Cetrizine and Docusate were all ordered from [facility] and will be shipped to the patient's home. Order confirmation number:  [id number]     Plan:  1.  Route note to PCP 2.  Call patient back in  1-3 business days to be sure he has continued to take all medications as prescribed.   Elayne Guerin, PharmD, [place] Clinical Pharmacist [phone]

## 2016-10-16 ENCOUNTER — Other Ambulatory Visit: Payer: Self-pay | Admitting: Pharmacist

## 2016-10-16 NOTE — Patient Outreach (Signed)
[facility]) Care Management  10/16/2016  Nicholas Whitehead 1955-12-02 [id number]   Called patient to follow up with him. HIPAA identifiers were obtained. Patient confirmed he restarted Chlorthalidone without issue. He has a follow up appointment with Dr. [name] on 10/24/16.  Patient is also awaiting his shipment of aspirin, docusate and cetirizine.   Plan: Follow up with patient after his PCP appointment.   Elayne Guerin, PharmD, [place] Clinical Pharmacist [phone]

## 2016-10-24 ENCOUNTER — Other Ambulatory Visit (INDEPENDENT_AMBULATORY_CARE_PROVIDER_SITE_OTHER): Payer: Medicare HMO

## 2016-10-24 ENCOUNTER — Encounter: Payer: Self-pay | Admitting: Internal Medicine

## 2016-10-24 ENCOUNTER — Telehealth: Payer: Self-pay | Admitting: Internal Medicine

## 2016-10-24 ENCOUNTER — Ambulatory Visit (INDEPENDENT_AMBULATORY_CARE_PROVIDER_SITE_OTHER): Payer: Medicare HMO | Admitting: Internal Medicine

## 2016-10-24 VITALS — BP 118/62 | HR 67 | Temp 98.9°F | Resp 16 | Ht 73.0 in | Wt 189.0 lb

## 2016-10-24 DIAGNOSIS — K5904 Chronic idiopathic constipation: Secondary | ICD-10-CM | POA: Insufficient documentation

## 2016-10-24 DIAGNOSIS — J410 Simple chronic bronchitis: Secondary | ICD-10-CM | POA: Diagnosis not present

## 2016-10-24 DIAGNOSIS — Z794 Long term (current) use of insulin: Secondary | ICD-10-CM | POA: Diagnosis not present

## 2016-10-24 DIAGNOSIS — E7800 Pure hypercholesterolemia, unspecified: Secondary | ICD-10-CM | POA: Diagnosis not present

## 2016-10-24 DIAGNOSIS — I10 Essential (primary) hypertension: Secondary | ICD-10-CM

## 2016-10-24 DIAGNOSIS — E1140 Type 2 diabetes mellitus with diabetic neuropathy, unspecified: Secondary | ICD-10-CM | POA: Diagnosis not present

## 2016-10-24 DIAGNOSIS — Z23 Encounter for immunization: Secondary | ICD-10-CM

## 2016-10-24 LAB — THYROID PANEL WITH TSH
FREE THYROXINE INDEX: 1.7 (ref 1.4–3.8)
T3 UPTAKE: 28 % (ref 22–35)
T4, Total: 6.2 ug/dL (ref 4.9–10.5)
TSH: 1.04 m[IU]/L (ref 0.40–4.50)

## 2016-10-24 LAB — COMPREHENSIVE METABOLIC PANEL
ALK PHOS: 57 U/L (ref 39–117)
ALT: 26 U/L (ref 0–53)
AST: 31 U/L (ref 0–37)
Albumin: 4.4 g/dL (ref 3.5–5.2)
BILIRUBIN TOTAL: 0.5 mg/dL (ref 0.2–1.2)
BUN: 12 mg/dL (ref 6–23)
CALCIUM: 10.1 mg/dL (ref 8.4–10.5)
CHLORIDE: 95 meq/L — AB (ref 96–112)
CO2: 31 meq/L (ref 19–32)
CREATININE: 1 mg/dL (ref 0.40–1.50)
GFR: 98.37 mL/min (ref >60.00)
Glucose, Bld: 234 mg/dL — ABNORMAL HIGH (ref 70–99)
POTASSIUM: 4 meq/L (ref 3.5–5.1)
Sodium: 134 mEq/L — ABNORMAL LOW (ref 135–145)
Total Protein: 8.4 g/dL — ABNORMAL HIGH (ref 6.0–8.3)

## 2016-10-24 LAB — URINALYSIS, ROUTINE W REFLEX MICROSCOPIC
BILIRUBIN URINE: NEGATIVE
Hgb urine dipstick: NEGATIVE
Ketones, ur: NEGATIVE
Leukocytes, UA: NEGATIVE
Nitrite: NEGATIVE
PH: 6.5 (ref 5.0–8.0)
TOTAL PROTEIN, URINE-UPE24: NEGATIVE
Urine Glucose: >=1000 — AB
Urobilinogen, UA: 0.2 (ref 0.0–1.0)

## 2016-10-24 LAB — CK: CK TOTAL: 388 U/L — AB (ref 7–232)

## 2016-10-24 LAB — POCT GLUCOSE (DEVICE FOR HOME USE): POC GLUCOSE: 314 mg/dL — AB (ref 70–99)

## 2016-10-24 MED ORDER — ALBUTEROL SULFATE HFA 108 (90 BASE) MCG/ACT IN AERS: 2.0000 | INHALATION_SPRAY | Freq: Four times a day (QID) | RESPIRATORY_TRACT | 5 refills | Status: DC | PRN

## 2016-10-24 MED ORDER — INSULIN GLARGINE 100 UNIT/ML SOLOSTAR PEN: PEN_INJECTOR | SUBCUTANEOUS | 5 refills | Status: DC

## 2016-10-24 MED ORDER — LINACLOTIDE 72 MCG PO CAPS: 72.0000 ug | ORAL_CAPSULE | Freq: Every day | ORAL | 1 refills | Status: DC

## 2016-10-24 NOTE — Telephone Encounter (Signed)
Patient requesting lantus to be sent to [facility] on [address].

## 2016-10-24 NOTE — Progress Notes (Signed)
Subjective:  Patient ID: Nicholas Whitehead, male    DOB: 03-09-1955  Age: 61 y.o. MRN: [id number]  CC: Hypertension; Diabetes; and Hyperlipidemia   HPI Nicholas Whitehead presents for f/up - He complains of a several week history of muscle aches, weakness, and lightheadedness. It looks like his endocrinologist start a statin about 3 months ago. He had an episode of syncope about 2 weeks ago related to low blood pressure, since then he has recovered and has had no more recurrences. He suffers from intermittent constipation for which he takes MiraLAX as needed but hasn't gotten much relief.  Outpatient Medications Prior to Visit  Medication Sig Dispense Refill  . ACCU-CHEK FASTCLIX LANCETS MISC USE  TO CHECK BLOOD SUGAR THREE TIMES DAILY 306 each 1  . aspirin EC 81 MG tablet Take 81 mg by mouth every morning.    . B-D ULTRAFINE III SHORT PEN 31G X 8 MM MISC use as directed five times a day 300 each 3  . BYSTOLIC 10 MG tablet take 2 tablets by mouth daily 60 tablet 11  . cetirizine (ZYRTEC) 10 MG tablet Take 10 mg by mouth daily.    . chlorthalidone (HYGROTON) 25 MG tablet Take 1 tablet (25 mg total) by mouth daily. 90 tablet 1  . COMPLERA 200-25-300 MG tablet take 1 tablet by mouth once daily 30 tablet 4  . gabapentin (NEURONTIN) 300 MG capsule take 1 capsule by mouth three times a day 90 capsule 3  . glucose blood (ACCU-CHEK GUIDE) test strip Use to check blood sugar three times per day. 200 each 2  . Glycopyrrolate-Formoterol (BEVESPI AEROSPHERE) 9-4.8 MCG/ACT AERO Inhale 2 puffs into the lungs 2 (two) times daily. 10.7 g 11  . irbesartan (AVAPRO) 300 MG tablet take 1 tablet by mouth once daily 90 tablet 3  . MULTIPLE VITAMIN PO Take 1 tablet by mouth.    Marland Kitchen NOVOLOG FLEXPEN 100 UNIT/ML FlexPen inject 20 to 25 units subcutaneously three times a day with meals 15 mL 1  . trazodone (DESYREL) 300 MG tablet Take 1 tablet (300 mg total) by mouth at bedtime. 90 tablet 3  . triamcinolone cream (KENALOG)  0.5 % APPLY 1 APPLICATION TOPICALLY THREE TIME A DAY 100 g 1  . docusate sodium (COLACE) 100 MG capsule Take 100 mg by mouth every morning.    . DULoxetine (CYMBALTA) 60 MG capsule Take 1 capsule (60 mg total) by mouth daily. 90 capsule 3  . Insulin Glargine (LANTUS SOLOSTAR) 100 UNIT/ML Solostar Pen INJECT 55 UNITS SUBCUTANEOUSLY EVERY MORNING AND 60 UNITS EVERY EVENING (Patient taking differently: INJECT 65 UNITS SUBCUTANEOUSLY EVERY MORNING AND 60 UNITS EVERY EVENING) 30 mL 0  . pravastatin (PRAVACHOL) 40 MG tablet take 1 tablet by mouth every evening 90 tablet 0   No facility-administered medications prior to visit.     ROS Review of Systems  Constitutional: Negative for appetite change, diaphoresis, fatigue, fever and unexpected weight change.  HENT: Negative.   Eyes: Negative for visual disturbance.  Respiratory: Negative for cough, chest tightness, shortness of breath and wheezing.   Cardiovascular: Negative for chest pain, palpitations and leg swelling.  Gastrointestinal: Positive for constipation. Negative for abdominal pain, anal bleeding, diarrhea, nausea and vomiting.  Endocrine: Negative.   Genitourinary: Negative.  Negative for difficulty urinating.  Musculoskeletal: Positive for myalgias. Negative for arthralgias and back pain.  Skin: Negative.   Allergic/Immunologic: Negative.   Neurological: Positive for weakness and light-headedness. Negative for dizziness and numbness.  Hematological: Negative for adenopathy.  Does not bruise/bleed easily.  Psychiatric/Behavioral: Negative for agitation, decreased concentration, dysphoric mood, sleep disturbance and suicidal ideas. The patient is nervous/anxious.     Objective:  BP 118/62 (BP Location: Left Arm, Patient Position: Sitting, Cuff Size: Normal)   Pulse 67   Temp 98.9 F (37.2 C) (Oral)   Resp 16   Ht 6\' 1"  (3.419 m)   Wt 189 lb (85.7 kg)   SpO2 97%   BMI 24.94 kg/m   BP Readings from Last 3 Encounters:  10/24/16  118/62  09/11/16 (!) 166/95  08/22/16 (!) 162/80    Wt Readings from Last 3 Encounters:  10/24/16 189 lb (85.7 kg)  08/22/16 212 lb (96.2 kg)  06/30/16 202 lb (91.6 kg)    Physical Exam  Constitutional: He is oriented to person, place, and time. No distress.  HENT:  Mouth/Throat: Oropharynx is clear and moist. No oropharyngeal exudate.  Eyes: Conjunctivae are normal. Right eye exhibits no discharge. Left eye exhibits no discharge. No scleral icterus.  Neck: Normal range of motion. Neck supple. No JVD present. No thyromegaly present.  Cardiovascular: Normal rate, regular rhythm and intact distal pulses.  Exam reveals no gallop and no friction rub.   No murmur heard. Pulmonary/Chest: Effort normal and breath sounds normal. No respiratory distress. He has no wheezes. He has no rales. He exhibits no tenderness.  Abdominal: Soft. Bowel sounds are normal. He exhibits no distension and no mass. There is no tenderness. There is no rebound and no guarding.  Musculoskeletal: He exhibits no edema, tenderness or deformity.  Lymphadenopathy:    He has no cervical adenopathy.  Neurological: He is alert and oriented to person, place, and time.  Skin: Skin is warm and dry. No rash noted. He is not diaphoretic. No erythema. No pallor.  Vitals reviewed.   Lab Results  Component Value Date   WBC 11.0 (H) 09/11/2016   HGB 17.3 (H) 09/11/2016   HCT 47.1 09/11/2016   PLT 163 09/11/2016   GLUCOSE 234 (H) 10/24/2016   CHOL 110 01/25/2016   TRIG 120.0 01/25/2016   HDL 40.40 01/25/2016   LDLCALC 45 01/25/2016   ALT 26 10/24/2016   AST 31 10/24/2016   NA 134 (L) 10/24/2016   K 4.0 10/24/2016   CL 95 (L) 10/24/2016   CREATININE 1.00 10/24/2016   BUN 12 10/24/2016   CO2 31 10/24/2016   TSH 1.04 10/24/2016   PSA 0.82 03/25/2015   HGBA1C 8.3 (H) 08/22/2016   MICROALBUR 0.8 10/24/2016    Dg Abdomen 1 View  Result Date: 09/11/2016 CLINICAL DATA:  Abdominal pain.  Constipation. EXAM: ABDOMEN - 1  VIEW COMPARISON:  None. FINDINGS: No dilated small bowel loops. Mild gas and stool in the large bowel. No evidence of pneumatosis or pneumoperitoneum. No radiopaque urolithiasis. Mild lumbar spondylosis. IMPRESSION: Nonobstructive bowel gas pattern.  Mild colonic stool burden. Electronically Signed   By: Ilona Sorrel M.D.   On: 09/11/2016 14:28    Assessment & Plan:   Nicholas Whitehead was seen today for hypertension, diabetes and hyperlipidemia.  Diagnoses and all orders for this visit:  Type 2 diabetes mellitus with diabetic neuropathy, with long-term current use of insulin ([place])- his A1c is up to 8.3%. His blood sugars are not adequately well controlled. I will increase his dose of basal insulin and he agrees to follow-up with his endocrinologist. -     POCT Glucose (Device for Home Use) -     Comprehensive metabolic panel; Future -  Microalbumin / creatinine urine ratio; Future -     Ambulatory referral to Endocrinology -     Insulin Glargine (LANTUS SOLOSTAR) 100 UNIT/ML Solostar Pen; INJECT 65 UNITS SUBCUTANEOUSLY EVERY MORNING AND 60 UNITS EVERY EVENING  Need for influenza vaccination -     Flu Vaccine QUAD 36+ mos IM  Simple chronic bronchitis (HCC)  Essential hypertension- his blood pressure is adequately well controlled. Electrolytes and renal function are normal. -     Comprehensive metabolic panel; Future -     Urinalysis, Routine w reflex microscopic; Future  Chronic idiopathic constipation- he takes no medications and would cause this. His labs are negative for any secondary or again it causes. Will treat with Linzess. -     Thyroid Panel With TSH; Future -     CK; Future -     albuterol (PROVENTIL HFA;VENTOLIN HFA) 108 (90 Base) MCG/ACT inhaler; Inhale 2 puffs into the lungs every 6 (six) hours as needed for wheezing or shortness of breath. -     linaclotide (LINZESS) 72 MCG capsule; Take 1 capsule (72 mcg total) by mouth daily before breakfast.   I have discontinued Mr.  Hinch docusate sodium, DULoxetine, and pravastatin. I have also changed his Insulin Glargine. Additionally, I am having him start on albuterol and linaclotide. Lastly, I am having him maintain his cetirizine, aspirin EC, ACCU-CHEK FASTCLIX LANCETS, triamcinolone cream, BYSTOLIC, trazodone, Glycopyrrolate-Formoterol, irbesartan, glucose blood, COMPLERA, gabapentin, chlorthalidone, B-D ULTRAFINE III SHORT PEN, NOVOLOG FLEXPEN, and MULTIPLE VITAMIN PO.  Meds ordered this encounter  Medications  . albuterol (PROVENTIL HFA;VENTOLIN HFA) 108 (90 Base) MCG/ACT inhaler    Sig: Inhale 2 puffs into the lungs every 6 (six) hours as needed for wheezing or shortness of breath.    Dispense:  1 Inhaler    Refill:  5  . linaclotide (LINZESS) 72 MCG capsule    Sig: Take 1 capsule (72 mcg total) by mouth daily before breakfast.    Dispense:  90 capsule    Refill:  1  . Insulin Glargine (LANTUS SOLOSTAR) 100 UNIT/ML Solostar Pen    Sig: INJECT 65 UNITS SUBCUTANEOUSLY EVERY MORNING AND 60 UNITS EVERY EVENING    Dispense:  30 mL    Refill:  5     Follow-up: Return in about 3 months (around 01/24/2017).  Scarlette Calico, MD

## 2016-10-24 NOTE — Telephone Encounter (Signed)
Pls advise what dosage pt should be taking had OV today...Johny Chess

## 2016-10-24 NOTE — Patient Instructions (Signed)
Constipation, Adult °Constipation is when a person: °· Poops (has a bowel movement) fewer times in a week than normal. °· Has a hard time pooping. °· Has poop that is dry, hard, or bigger than normal. ° °Follow these instructions at home: °Eating and drinking ° °· Eat foods that have a lot of fiber, such as: °? Fresh fruits and vegetables. °? Whole grains. °? Beans. °· Eat less of foods that are high in fat, low in fiber, or overly processed, such as: °? French fries. °? Hamburgers. °? Cookies. °? Candy. °? Soda. °· Drink enough fluid to keep your pee (urine) clear or pale yellow. °General instructions °· Exercise regularly or as told by your doctor. °· Go to the restroom when you feel like you need to poop. Do not hold it in. °· Take over-the-counter and prescription medicines only as told by your doctor. These include any fiber supplements. °· Do pelvic floor retraining exercises, such as: °? Doing deep breathing while relaxing your lower belly (abdomen). °? Relaxing your pelvic floor while pooping. °· Watch your condition for any changes. °· Keep all follow-up visits as told by your doctor. This is important. °Contact a doctor if: °· You have pain that gets worse. °· You have a fever. °· You have not pooped for 4 days. °· You throw up (vomit). °· You are not hungry. °· You lose weight. °· You are bleeding from the anus. °· You have thin, pencil-like poop (stool). °Get help right away if: °· You have a fever, and your symptoms suddenly get worse. °· You leak poop or have blood in your poop. °· Your belly feels hard or bigger than normal (is bloated). °· You have very bad belly pain. °· You feel dizzy or you faint. °This information is not intended to replace advice given to you by your health care provider. Make sure you discuss any questions you have with your health care provider. °Document Released: 06/14/2007 Document Revised: 07/16/2015 Document Reviewed: 06/16/2015 °Elsevier Interactive Patient Education ©  2017 [employer] Inc. ° °

## 2016-10-25 ENCOUNTER — Other Ambulatory Visit: Payer: Self-pay | Admitting: Pharmacist

## 2016-10-25 ENCOUNTER — Other Ambulatory Visit: Payer: Self-pay | Admitting: *Deleted

## 2016-10-25 LAB — MICROALBUMIN / CREATININE URINE RATIO
CREATININE, U: 53.9 mg/dL
MICROALB/CREAT RATIO: 1.5 mg/g (ref 0.0–30.0)
Microalb, Ur: 0.8 mg/dL (ref 0.0–1.9)

## 2016-10-25 NOTE — Patient Outreach (Signed)
[facility]) Care Management  [place]   10/25/2016  Nicholas Whitehead [birth year] [id number]  Subjective: Patient was called to follow up on medication adherence. HIPAA identifiers were obtained. Patient is a 61 year old male with multiple medical conditions including but not limited to:  Anxiety, depression, DJD of shoulder, HIV, hypertension, type 2 diabetes with secondary neuropathy.  Patient reported feeling "ok" today. He saw Dr. [name] yesterday and was started on Linzess.  Objective:   Encounter Medications: Outpatient Encounter Prescriptions as of 10/25/2016  Medication Sig  . ACCU-CHEK FASTCLIX LANCETS MISC USE  TO CHECK BLOOD SUGAR THREE TIMES DAILY  . albuterol (PROVENTIL HFA;VENTOLIN HFA) 108 (90 Base) MCG/ACT inhaler Inhale 2 puffs into the lungs every 6 (six) hours as needed for wheezing or shortness of breath.  Marland Kitchen aspirin EC 81 MG tablet Take 81 mg by mouth every morning.  . B-D ULTRAFINE III SHORT PEN 31G X 8 MM MISC use as directed five times a day  . BYSTOLIC 10 MG tablet take 2 tablets by mouth daily  . cetirizine (ZYRTEC) 10 MG tablet Take 10 mg by mouth daily.  . chlorthalidone (HYGROTON) 25 MG tablet Take 1 tablet (25 mg total) by mouth daily.  . COMPLERA 200-25-300 MG tablet take 1 tablet by mouth once daily  . gabapentin (NEURONTIN) 300 MG capsule take 1 capsule by mouth three times a day  . glucose blood (ACCU-CHEK GUIDE) test strip Use to check blood sugar three times per day.  . Glycopyrrolate-Formoterol (BEVESPI AEROSPHERE) 9-4.8 MCG/ACT AERO Inhale 2 puffs into the lungs 2 (two) times daily.  . Insulin Glargine (LANTUS SOLOSTAR) 100 UNIT/ML Solostar Pen INJECT 65 UNITS SUBCUTANEOUSLY EVERY MORNING AND 60 UNITS EVERY EVENING  . irbesartan (AVAPRO) 300 MG tablet take 1 tablet by mouth once daily  . linaclotide (LINZESS) 72 MCG capsule Take 1 capsule (72 mcg total) by mouth daily before breakfast.  . MULTIPLE VITAMIN PO Take 1 tablet by  mouth.  Marland Kitchen NOVOLOG FLEXPEN 100 UNIT/ML FlexPen inject 20 to 25 units subcutaneously three times a day with meals  . trazodone (DESYREL) 300 MG tablet Take 1 tablet (300 mg total) by mouth at bedtime.  . triamcinolone cream (KENALOG) 0.5 % APPLY 1 APPLICATION TOPICALLY THREE TIME A DAY   No facility-administered encounter medications on file as of 10/25/2016.     Functional Status: In your present state of health, do you have any difficulty performing the following activities: 10/05/2016 08/22/2016  Hearing? N N  Vision? Y N  Comment per patient his eyes get tired and it is hard to focus -  Difficulty concentrating or making decisions? Y N  Walking or climbing stairs? N Y  Dressing or bathing? N N  Doing errands, shopping? N N  Preparing Food and eating ? N N  Using the Toilet? N N  In the past six months, have you accidently leaked urine? N N  Do you have problems with loss of bowel control? N N  Managing your Medications? Y N  Managing your Finances? N N  Housekeeping or managing your Housekeeping? N N  Some recent data might be hidden    Fall/Depression Screening: Fall Risk  10/13/2016 10/05/2016 08/22/2016  Falls in the past year? Yes No No  Comment "blacked out"  going to bathroom - -  Number falls in past yr: 1 - -  Injury with Fall? No - -  Risk Factor Category  - - -  Risk for fall due  to : History of fall(s);Medication side effect - Impaired balance/gait  Follow up Falls prevention discussed;Education provided - -   PHQ 2/9 Scores 10/13/2016 10/07/2016 10/05/2016 08/30/2016 08/22/2016 06/27/2016 12/23/2015  PHQ - 2 Score 1 3 2 2 4 2  0  PHQ- 9 Score 3 13 10  - 12 3 -      Assessment:  Dr. [name] changed patient's Lantus dose to 65 units in the morning and 60 units in the evening. Patient said he did not use Lantus this morning because he said he needed to go pick up a refill from the pharmacy. He was encouraged to pick up his refill ASAP. Patient reported taking his first  Linzess dose this morning.  Plan: Call patient in 7-10 days to see how he is feeling with Linzess

## 2016-10-26 ENCOUNTER — Telehealth: Payer: Self-pay | Admitting: Internal Medicine

## 2016-10-26 NOTE — Assessment & Plan Note (Signed)
Since starting a statin he complains of myopathy and his CPK level has nearly quadrupled over the last year. I've asked him to stop taking the statin. Will reassess in a couple weeks.

## 2016-10-26 NOTE — Telephone Encounter (Signed)
Pt called for his test results from 10/16, please call back

## 2016-10-27 ENCOUNTER — Encounter: Payer: Self-pay | Admitting: Endocrinology

## 2016-10-27 ENCOUNTER — Ambulatory Visit (INDEPENDENT_AMBULATORY_CARE_PROVIDER_SITE_OTHER): Payer: Medicare HMO | Admitting: Endocrinology

## 2016-10-27 ENCOUNTER — Ambulatory Visit: Payer: Self-pay | Admitting: *Deleted

## 2016-10-27 VITALS — BP 90/60 | HR 64 | Ht 73.0 in | Wt 191.1 lb

## 2016-10-27 DIAGNOSIS — E1165 Type 2 diabetes mellitus with hyperglycemia: Secondary | ICD-10-CM | POA: Diagnosis not present

## 2016-10-27 DIAGNOSIS — Z794 Long term (current) use of insulin: Secondary | ICD-10-CM | POA: Diagnosis not present

## 2016-10-27 MED ORDER — PIOGLITAZONE HCL 15 MG PO TABS: 15.0000 mg | ORAL_TABLET | Freq: Every day | ORAL | 3 refills | Status: DC

## 2016-10-27 MED ORDER — FUROSEMIDE 20 MG PO TABS: 20.0000 mg | ORAL_TABLET | Freq: Every day | ORAL | 3 refills | Status: DC

## 2016-10-27 NOTE — Progress Notes (Signed)
Patient ID: Nicholas Whitehead, male   DOB: [birth year], 61 y.o.   MRN: [id number]   Reason for Appointment: Type II Diabetes follow-up   History of Present Illness   Diagnosis date: 1998    Previous history: He was started on oral agents at diagnosis and around 2006 was put on insulin His blood sugars are usually well controlled when he takes Actos along with his insulin. He had been on Invokana since 04/2013  Recent history:   Insulin regimen: Lantus 65-60 units bid;  Novolog  20-25 AC tid         He is on a regimen of basal bolus insulin with Actos. He has usually been requiring large doses of insulin especially  Lantus which he takes twice a day    A1c has been consistently high, now 8.3 compared to 9.7  Current management, blood sugar patterns and problems:  Although he has done much better with his diet and compliance with insulin his blood sugars are quite out of control  He says that he was having swelling in August in his feet and his ECP told him to stop Actos  Previously whenever he goes off Actos he has significant amount of hyperglycemia and insulin resistance  He has not changed his insulin despite high sugars  Blood sugars are averaging about 240 throughout the day sometimes higher in the afternoon and morning  He has lost weight despite quitting smoking  He says that he is not eating as much and is not overeating are getting high sugar drinks  Still not able to do much walking and has had more fatigue now  Oral hypoglycemic drugs: was on  Actos 30 mg    Side effects from medications:  urgency, frequent urination from Invokana Proper timing of medications in relation to meals: Yes.          Monitors blood glucose:  3.4 times a day  Glucometer:  Accu-Chek  Mean values apply above for all meters except median for One Touch  PRE-MEAL Fasting Lunch Dinner Bedtime Overall  Glucose range: 163-290  163-347   189-297    Mean/median: 237  239  240  252 242             Meals: Breakfast variable, 9-10 AM Lunch 2-3pm  usually avoiding high-fat meals, dinner 6 pm         Physical activity: exercise: not walking much recently         Dietician visit: Most recent: 2013   Weight control:  Wt Readings from Last 3 Encounters:  10/27/16 191 lb 2 oz (86.7 kg)  10/24/16 189 lb (85.7 kg)  08/22/16 212 lb (96.2 kg)          Diabetes labs:  Lab Results  Component Value Date   HGBA1C 8.3 (H) 08/22/2016   HGBA1C 9.7 (H) 05/02/2016   HGBA1C 8.7 (H) 01/25/2016   Lab Results  Component Value Date   MICROALBUR 0.8 10/24/2016   LDLCALC 45 01/25/2016   CREATININE 1.00 10/24/2016  Other active problems discussed today: See review of systems   Appointment on 10/24/2016  Component Date Value Ref Range Status  . T3 Uptake 10/24/2016 28  22 - 35 % Final  . T4, Total 10/24/2016 6.2  4.9 - 10.5 mcg/dL Final  . Free Thyroxine Index 10/24/2016 1.7  1.4 - 3.8 Final  . TSH 10/24/2016 1.04  0.40 - 4.50 mIU/L Final  . Sodium 10/24/2016 134* 135 - 145 mEq/L Final  .  Potassium 10/24/2016 4.0  3.5 - 5.1 mEq/L Final  . Chloride 10/24/2016 95* 96 - 112 mEq/L Final  . CO2 10/24/2016 31  19 - 32 mEq/L Final  . Glucose, Bld 10/24/2016 234* 70 - 99 mg/dL Final  . BUN 10/24/2016 12  6 - 23 mg/dL Final  . Creatinine, Ser 10/24/2016 1.00  0.40 - 1.50 mg/dL Final  . Total Bilirubin 10/24/2016 0.5  0.2 - 1.2 mg/dL Final  . Alkaline Phosphatase 10/24/2016 57  39 - 117 U/L Final  . AST 10/24/2016 31  0 - 37 U/L Final  . ALT 10/24/2016 26  0 - 53 U/L Final  . Total Protein 10/24/2016 8.4* 6.0 - 8.3 g/dL Final  . Albumin 10/24/2016 4.4  3.5 - 5.2 g/dL Final  . Calcium 10/24/2016 10.1  8.4 - 10.5 mg/dL Final  . GFR 10/24/2016 98.37  >60.00 mL/min Final  . Total CK 10/24/2016 388* 7 - 232 U/L Final  . Color, Urine 10/24/2016 YELLOW  Yellow;Lt. Yellow Final  . APPearance 10/24/2016 CLEAR  Clear Final  . Specific Gravity, Urine 10/24/2016 <=1.005* 1.000 - 1.030 Final  .  pH 10/24/2016 6.5  5.0 - 8.0 Final  . Total Protein, Urine 10/24/2016 NEGATIVE  Negative Final  . Urine Glucose 10/24/2016 >=1000* Negative Final  . Ketones, ur 10/24/2016 NEGATIVE  Negative Final  . Bilirubin Urine 10/24/2016 NEGATIVE  Negative Final  . Hgb urine dipstick 10/24/2016 NEGATIVE  Negative Final  . Urobilinogen, UA 10/24/2016 0.2  0.0 - 1.0 Final  . Leukocytes, UA 10/24/2016 NEGATIVE  Negative Final  . Nitrite 10/24/2016 NEGATIVE  Negative Final  . WBC, UA 10/24/2016 0-2/hpf  0-2/hpf Final  . RBC / HPF 10/24/2016 0-2/hpf  0-2/hpf Final  . Squamous Epithelial / LPF 10/24/2016 Rare(0-4/hpf)  Rare(0-4/hpf) Final  . Microalb, Ur 10/24/2016 0.8  0.0 - 1.9 mg/dL Final  . Creatinine,U 10/24/2016 53.9  mg/dL Final  . Microalb Creat Ratio 10/24/2016 1.5  0.0 - 30.0 mg/g Final  Office Visit on 10/24/2016  Component Date Value Ref Range Status  . POC Glucose 10/24/2016 314* 70 - 99 mg/dl Final     Allergies as of 10/27/2016      Reactions   Benazepril Cough   Pravastatin Other (See Comments)   myositis   Glipizide Nausea Only      Medication List       Accurate as of 10/27/16  2:50 PM. Always use your most recent med list.          ACCU-CHEK FASTCLIX LANCETS Misc USE  TO CHECK BLOOD SUGAR THREE TIMES DAILY   albuterol 108 (90 Base) MCG/ACT inhaler Commonly known as:  PROVENTIL HFA;VENTOLIN HFA Inhale 2 puffs into the lungs every 6 (six) hours as needed for wheezing or shortness of breath.   aspirin EC 81 MG tablet Take 81 mg by mouth every morning.   B-D ULTRAFINE III SHORT PEN 31G X 8 MM Misc Generic drug:  Insulin Pen Needle use as directed five times a day   BYSTOLIC 10 MG tablet Generic drug:  nebivolol take 2 tablets by mouth daily   cetirizine 10 MG tablet Commonly known as:  ZYRTEC Take 10 mg by mouth daily.   chlorthalidone 25 MG tablet Commonly known as:  HYGROTON Take 1 tablet (25 mg total) by mouth daily.   COMPLERA 200-25-300 MG  tablet Generic drug:  emtricitabine-rilpivir-tenofovir DF take 1 tablet by mouth once daily   furosemide 20 MG tablet Commonly known as:  LASIX Take  1 tablet (20 mg total) by mouth daily.   gabapentin 300 MG capsule Commonly known as:  NEURONTIN take 1 capsule by mouth three times a day   glucose blood test strip Commonly known as:  ACCU-CHEK GUIDE Use to check blood sugar three times per day.   Glycopyrrolate-Formoterol 9-4.8 MCG/ACT Aero Commonly known as:  BEVESPI AEROSPHERE Inhale 2 puffs into the lungs 2 (two) times daily.   Insulin Glargine 100 UNIT/ML Solostar Pen Commonly known as:  LANTUS SOLOSTAR INJECT 65 UNITS SUBCUTANEOUSLY EVERY MORNING AND 60 UNITS EVERY EVENING   irbesartan 300 MG tablet Commonly known as:  AVAPRO take 1 tablet by mouth once daily   linaclotide 72 MCG capsule Commonly known as:  LINZESS Take 1 capsule (72 mcg total) by mouth daily before breakfast.   MULTIPLE VITAMIN PO Take 1 tablet by mouth.   NOVOLOG FLEXPEN 100 UNIT/ML FlexPen Generic drug:  insulin aspart inject 20 to 25 units subcutaneously three times a day with meals   pioglitazone 15 MG tablet Commonly known as:  ACTOS Take 1 tablet (15 mg total) by mouth daily.   trazodone 300 MG tablet Commonly known as:  DESYREL Take 1 tablet (300 mg total) by mouth at bedtime.   triamcinolone cream 0.5 % Commonly known as:  KENALOG APPLY 1 APPLICATION TOPICALLY THREE TIME A DAY       Allergies:  Allergies  Allergen Reactions  . Benazepril Cough  . Pravastatin Other (See Comments)    myositis  . Glipizide Nausea Only    Past Medical History:  Diagnosis Date  . Anxiety   . Cataract    right   . Depression   . Diabetes mellitus without complication ([place])   . DM (diabetes mellitus screen)   . HIV disease ([place]) 09/12/1995  . HIV infection ([place])   . Hypercholesterolemia   . Hyperlipidemia   . Hypertension     Past Surgical History:  Procedure Laterality Date  .  COLONOSCOPY WITH PROPOFOL  01/16/2012   Procedure: COLONOSCOPY WITH PROPOFOL;  Surgeon: Garlan Fair, MD;  Location: [facility];  Service: Endoscopy;  Laterality: N/A;  . EYE SURGERY  2005   catarct surgery  . optic lens surgery  2005    Family History  Problem Relation Age of Onset  . Arthritis Mother   . Diabetes Mother   . Heart disease Mother   . Stroke Mother   . Arthritis Father   . Diabetes Father   . Heart disease Father     Social History:  reports that he quit smoking about 4 weeks ago. His smoking use included Cigarettes. He smoked 0.25 packs per day. He has never used smokeless tobacco. He reports that he does not drink alcohol or use drugs.  Review of Systems:   Hypertension:  currently on Bystolic, Chlorthalidone  and on Avapro Chlorthalidone started in April because of edema Followed by PCP Does not complain of any lightheadedness but has had an episode of syncope this summer   Lipids: LDL is controlled with taking pravastatin He has been taking this for quite some time although his PCP told him to stop it because of muscle cramps for the last 2 months    Lab Results  Component Value Date   CHOL 110 01/25/2016   HDL 40.40 01/25/2016   LDLCALC 45 01/25/2016   TRIG 120.0 01/25/2016   CHOLHDL 3 01/25/2016    He has symptoms of neuropathy with paresthesia in feet and legs , Treated with  gabapentin   Depression:  followed by PCP    LABS:  Appointment on 10/24/2016  Component Date Value Ref Range Status  . T3 Uptake 10/24/2016 28  22 - 35 % Final  . T4, Total 10/24/2016 6.2  4.9 - 10.5 mcg/dL Final  . Free Thyroxine Index 10/24/2016 1.7  1.4 - 3.8 Final  . TSH 10/24/2016 1.04  0.40 - 4.50 mIU/L Final  . Sodium 10/24/2016 134* 135 - 145 mEq/L Final  . Potassium 10/24/2016 4.0  3.5 - 5.1 mEq/L Final  . Chloride 10/24/2016 95* 96 - 112 mEq/L Final  . CO2 10/24/2016 31  19 - 32 mEq/L Final  . Glucose, Bld 10/24/2016 234* 70 - 99 mg/dL Final  . BUN  10/24/2016 12  6 - 23 mg/dL Final  . Creatinine, Ser 10/24/2016 1.00  0.40 - 1.50 mg/dL Final  . Total Bilirubin 10/24/2016 0.5  0.2 - 1.2 mg/dL Final  . Alkaline Phosphatase 10/24/2016 57  39 - 117 U/L Final  . AST 10/24/2016 31  0 - 37 U/L Final  . ALT 10/24/2016 26  0 - 53 U/L Final  . Total Protein 10/24/2016 8.4* 6.0 - 8.3 g/dL Final  . Albumin 10/24/2016 4.4  3.5 - 5.2 g/dL Final  . Calcium 10/24/2016 10.1  8.4 - 10.5 mg/dL Final  . GFR 10/24/2016 98.37  >60.00 mL/min Final  . Total CK 10/24/2016 388* 7 - 232 U/L Final  . Color, Urine 10/24/2016 YELLOW  Yellow;Lt. Yellow Final  . APPearance 10/24/2016 CLEAR  Clear Final  . Specific Gravity, Urine 10/24/2016 <=1.005* 1.000 - 1.030 Final  . pH 10/24/2016 6.5  5.0 - 8.0 Final  . Total Protein, Urine 10/24/2016 NEGATIVE  Negative Final  . Urine Glucose 10/24/2016 >=1000* Negative Final  . Ketones, ur 10/24/2016 NEGATIVE  Negative Final  . Bilirubin Urine 10/24/2016 NEGATIVE  Negative Final  . Hgb urine dipstick 10/24/2016 NEGATIVE  Negative Final  . Urobilinogen, UA 10/24/2016 0.2  0.0 - 1.0 Final  . Leukocytes, UA 10/24/2016 NEGATIVE  Negative Final  . Nitrite 10/24/2016 NEGATIVE  Negative Final  . WBC, UA 10/24/2016 0-2/hpf  0-2/hpf Final  . RBC / HPF 10/24/2016 0-2/hpf  0-2/hpf Final  . Squamous Epithelial / LPF 10/24/2016 Rare(0-4/hpf)  Rare(0-4/hpf) Final  . Microalb, Ur 10/24/2016 0.8  0.0 - 1.9 mg/dL Final  . Creatinine,U 10/24/2016 53.9  mg/dL Final  . Microalb Creat Ratio 10/24/2016 1.5  0.0 - 30.0 mg/g Final  Office Visit on 10/24/2016  Component Date Value Ref Range Status  . POC Glucose 10/24/2016 314* 70 - 99 mg/dl Final     Examination:   BP 90/60   Pulse 64   Ht 6\' 1"  (1.854 m)   Wt 191 lb 2 oz (86.7 kg)   SpO2 97%   BMI 25.22 kg/m   Body mass index is 25.22 kg/m.     ASSESSMENT/ PLAN:    Diabetes type 2:  See history of present illness for detailed discussion of his current management, blood sugar  patterns and problems identified  His blood sugars are Markedly elevated and persistently over 200 Average blood sugars 242, last A1c was 8.3 in no fructosamine done recently  Most likely his hyperglycemia is related to not taking Actos which has typically helped him with his control for several years now He has not been going off his diet and his reporting better compliance with his insulin regimen also Although he had some swelling of his ankles in August not clear  if this is related entirely to taking Actos Now taking chlorthalidone also Higher readings are not postprandially usually   Recommendations: Start Actos with the lower dose of 15 mg daily Increase Lantus to 70 units twice a day for now He will cut this back to his previous doses when blood sugars are close to 120 fasting Continue taking extra insulin at mealtimes for high readings Need more consistent follow-up, at least to come back in 3 weeks for reassessment If he has any EDEMA he will start taking Lasix 20 mg daily    Hypertension  blood pressure is low normal and probably related to fluid loss from hyperglycemia and using chlorthalidone Also complaining of muscle cramps likely to be from sodium depletion/magnesium depletion  he will temporarily stop chlorthalidone and reduce Avapro to half a tablet Follow-up in 3 weeks for reassessment    insomnia: He needs to talk to his PCP about this  Patient Instructions  STOP CHLORTHALIDONE Take 1/2 Avapro  Take Lasix as needed for swelling  For no go up to 70 units 2x daily on Lantus    Counseling time on subjects discussed in assessment and plan sections is over 50% of today's 25 minute visit   Bertie Simien 10/27/2016, 2:50 PM         ..

## 2016-10-27 NOTE — Patient Instructions (Addendum)
STOP CHLORTHALIDONE Take 1/2 Avapro  Take Lasix as needed for swelling  For no go up to 70 units 2x daily on Lantus

## 2016-10-29 ENCOUNTER — Other Ambulatory Visit: Payer: Self-pay | Admitting: Endocrinology

## 2016-11-01 ENCOUNTER — Other Ambulatory Visit: Payer: Self-pay | Admitting: *Deleted

## 2016-11-01 ENCOUNTER — Other Ambulatory Visit: Payer: Self-pay | Admitting: Pharmacist

## 2016-11-01 NOTE — Patient Outreach (Signed)
[facility]) Care Management  11/01/2016  Baudelio Karnes 29-Sep-1955 [id number]   Patient was called to follow up with him on the addition of Linzess.  HIPAA identifiers were obtained. Patient said he was not experiencing any new side effects after starting Linzess.  He visited his PCP chlorthalidone was officially discontinued. Lantus was increased to 70 units twice daily and Pioglitazone 15mg  was started. Patient's medications were reviewed over the phone.  Patient reported feeling better overall but still a little tired.  He did not report any other medication issues.  Plan: Close patient's pharmacy case as he was originally referred for medication adherence issues that have been resolved.  Patient communicated understanding about being able to reach out and ask pharmacy related questions or concerns in the future.

## 2016-11-02 ENCOUNTER — Encounter: Payer: Self-pay | Admitting: Pharmacist

## 2016-11-03 NOTE — Patient Outreach (Signed)
[facility]) Care Management  11/03/2016  Nicholas Whitehead 1955-10-20 [id number]      CSW made phone contact with patient on his birthday; he was "staying in and laying low". He reports his blood sugars are still not stable and feeling "shakey". He is still interested in therapy/counseling and I have provided him with some options but the copay is a barrier. CSW will continue to seek resources and follow up with patient.      Eduard Clos, MSW, [place] Worker  [place] [phone]

## 2016-11-05 ENCOUNTER — Other Ambulatory Visit: Payer: Self-pay | Admitting: Endocrinology

## 2016-11-06 ENCOUNTER — Telehealth: Payer: Self-pay | Admitting: Internal Medicine

## 2016-11-06 ENCOUNTER — Ambulatory Visit: Payer: Self-pay | Admitting: *Deleted

## 2016-11-06 NOTE — Telephone Encounter (Signed)
Patient is going to come in to complete a medical records release to get all his diagnosis.

## 2016-11-08 ENCOUNTER — Other Ambulatory Visit: Payer: Self-pay | Admitting: *Deleted

## 2016-11-08 ENCOUNTER — Ambulatory Visit: Payer: Self-pay | Admitting: *Deleted

## 2016-11-09 ENCOUNTER — Ambulatory Visit: Payer: Medicare HMO | Admitting: Internal Medicine

## 2016-11-09 ENCOUNTER — Ambulatory Visit: Payer: Self-pay | Admitting: *Deleted

## 2016-11-09 ENCOUNTER — Other Ambulatory Visit: Payer: Self-pay | Admitting: Endocrinology

## 2016-11-10 ENCOUNTER — Telehealth: Payer: Self-pay

## 2016-11-10 ENCOUNTER — Other Ambulatory Visit (INDEPENDENT_AMBULATORY_CARE_PROVIDER_SITE_OTHER): Payer: Medicare HMO

## 2016-11-10 ENCOUNTER — Other Ambulatory Visit: Payer: Self-pay | Admitting: Endocrinology

## 2016-11-10 DIAGNOSIS — Z794 Long term (current) use of insulin: Secondary | ICD-10-CM | POA: Diagnosis not present

## 2016-11-10 DIAGNOSIS — E1165 Type 2 diabetes mellitus with hyperglycemia: Secondary | ICD-10-CM | POA: Diagnosis not present

## 2016-11-10 LAB — BASIC METABOLIC PANEL
BUN: 6 mg/dL (ref 6–23)
CALCIUM: 9.3 mg/dL (ref 8.4–10.5)
CO2: 29 mEq/L (ref 19–32)
CREATININE: 0.85 mg/dL (ref 0.40–1.50)
Chloride: 101 mEq/L (ref 96–112)
GFR: 118.64 mL/min (ref >60.00)
Glucose, Bld: 215 mg/dL — ABNORMAL HIGH (ref 70–99)
Potassium: 4.3 mEq/L (ref 3.5–5.1)
SODIUM: 137 meq/L (ref 135–145)

## 2016-11-10 MED ORDER — METFORMIN HCL ER 500 MG PO TB24: ORAL_TABLET | ORAL | 3 refills | Status: DC

## 2016-11-10 NOTE — Telephone Encounter (Signed)
I am sending a prescription for metformin ER and also he needs to increase his Lantus from 65 units to 75 units twice a day..  Also needs to increase his NovoLog from 25 units to 30 units before each meal  He needs to talk to his PCP about his other symptoms

## 2016-11-10 NOTE — Telephone Encounter (Signed)
I called patient and went over the insulin dosages with him and let him know that the new prescription has been sent in for him and to contact his PCP for the other symptoms.

## 2016-11-10 NOTE — Telephone Encounter (Signed)
Patient came into the office for labs today and stated that his blood sugars are very high and that he feels terrible and is not able to sleep and the top of his head feels like it is numb. He also stated that he has not heard back from Dr. [name] office and has called there for his lab results but no one has gone over this with him. I have given his blood sugar readings from his meter that I downloaded today to Dr. Dwyane Dee.  Please advise.

## 2016-11-11 LAB — FRUCTOSAMINE: Fructosamine: 353 umol/L — ABNORMAL HIGH (ref 0–285)

## 2016-11-14 ENCOUNTER — Other Ambulatory Visit: Payer: Self-pay | Admitting: *Deleted

## 2016-11-14 NOTE — Patient Outreach (Signed)
[facility]) Care Management  11/14/2016  Nicholas Whitehead [birth year] [id number]   RN Health Coach attempted #1 follow up outreach call to patient.  Patient was unavailable. HIPPA compliance voicemail message left with return callback number.  Plan: RN will call patient again within 14 days.  [facility] [phone]

## 2016-11-15 ENCOUNTER — Ambulatory Visit (INDEPENDENT_AMBULATORY_CARE_PROVIDER_SITE_OTHER): Payer: Medicare HMO | Admitting: Endocrinology

## 2016-11-15 ENCOUNTER — Telehealth: Payer: Self-pay | Admitting: Endocrinology

## 2016-11-15 ENCOUNTER — Encounter: Payer: Self-pay | Admitting: Endocrinology

## 2016-11-15 VITALS — BP 120/70 | HR 64 | Ht 73.0 in | Wt 202.2 lb

## 2016-11-15 DIAGNOSIS — Z794 Long term (current) use of insulin: Secondary | ICD-10-CM

## 2016-11-15 DIAGNOSIS — E1165 Type 2 diabetes mellitus with hyperglycemia: Secondary | ICD-10-CM | POA: Diagnosis not present

## 2016-11-15 NOTE — Progress Notes (Signed)
Patient ID: Nicholas Whitehead, male   DOB: August 02, 1955, 61 y.o.   MRN: [id number]   Reason for Appointment: Type II Diabetes follow-up   History of Present Illness   Diagnosis date: 1998    Previous history: He was started on oral agents at diagnosis and around 2006 was put on insulin His blood sugars are usually well controlled when he takes Actos along with his insulin. He had been on Invokana since 04/2013  Recent history:   Insulin regimen: Lantus 75 units bid;  Novolog  30 AC tid         Oral hypoglycemic drugs: Metformin ER 500 mg twice a day, Actos 30 mg   He has usually been requiring large doses of insulin especially  Lantus which he takes twice a day    A1c has been consistently high, last 8.3 compared to 9.7  Current management, blood sugar patterns and problems:  Although he has been back on Actos for the last 3 weeks with only the last for 5 days that his blood sugars have started coming down  Also his insulin was increased with Lantus up to 150 units a day  Also he has been started on metformin ER empirically, not clear whether this has been used before with insulin in the past  He does feel subjectively better now but still somewhat thirsty  He still thinks that he is doing well with his diet and avoiding unhealthy foods, snacks or drinks with sugar  His weight is starting to come back up as expected  Currently still not able to exercise much  Relatively speaking his blood sugars are probably higher in the morning although sporadically higher in the afternoon and also  No edema recently with taking 30 mg Actos    Side effects from medications:  urgency, frequent urination from Invokana Proper timing of medications in relation to meals: Yes.          Monitors blood glucose:  3.4 times a day  Glucometer:  Accu-Chek  Blood sugars for about the last 10 days:  PRE-MEAL Fasting Lunch Dinner Bedtime Overall  Glucose range:  1 63-240  137-304   1 65-298    1 34-280    Mean/median:                Meals: Breakfast variable, 9-10 AM Lunch 2-3pm  usually avoiding high-fat meals, dinner 6 pm         Physical activity: exercise: not walking much recently         Dietician visit: Most recent: 2013   Weight control:  Wt Readings from Last 3 Encounters:  11/15/16 202 lb 3.2 oz (91.7 kg)  10/27/16 191 lb 2 oz (86.7 kg)  10/24/16 189 lb (85.7 kg)          Diabetes labs:  Lab Results  Component Value Date   HGBA1C 8.3 (H) 08/22/2016   HGBA1C 9.7 (H) 05/02/2016   HGBA1C 8.7 (H) 01/25/2016   Lab Results  Component Value Date   MICROALBUR 0.8 10/24/2016   LDLCALC 45 01/25/2016   CREATININE 0.85 11/10/2016  Other active problems discussed today: See review of systems   Lab on 11/10/2016  Component Date Value Ref Range Status  . Sodium 11/10/2016 137  135 - 145 mEq/L Final  . Potassium 11/10/2016 4.3  3.5 - 5.1 mEq/L Final  . Chloride 11/10/2016 101  96 - 112 mEq/L Final  . CO2 11/10/2016 29  19 - 32 mEq/L Final  .  Glucose, Bld 11/10/2016 215* 70 - 99 mg/dL Final  . BUN 11/10/2016 6  6 - 23 mg/dL Final  . Creatinine, Ser 11/10/2016 0.85  0.40 - 1.50 mg/dL Final  . Calcium 11/10/2016 9.3  8.4 - 10.5 mg/dL Final  . GFR 11/10/2016 118.64  >60.00 mL/min Final  . Fructosamine 11/10/2016 353* 0 - 285 umol/L Final   Comment: Published reference interval for apparently healthy subjects between age 32 and 3 is 51 - 285 umol/L and in a poorly controlled diabetic population is 228 - 563 umol/L with a mean of 396 umol/L.      Allergies as of 11/15/2016      Reactions   Benazepril Cough   Pravastatin Other (See Comments)   myositis   Glipizide Nausea Only      Medication List        Accurate as of 11/15/16  1:51 PM. Always use your most recent med list.          ACCU-CHEK FASTCLIX LANCETS Misc USE  TO CHECK BLOOD SUGAR THREE TIMES DAILY   albuterol 108 (90 Base) MCG/ACT inhaler Commonly known as:  PROVENTIL HFA;VENTOLIN  HFA Inhale 2 puffs into the lungs every 6 (six) hours as needed for wheezing or shortness of breath.   aspirin EC 81 MG tablet Take 81 mg by mouth every morning.   B-D ULTRAFINE III SHORT PEN 31G X 8 MM Misc Generic drug:  Insulin Pen Needle use as directed five times a day   BYSTOLIC 10 MG tablet Generic drug:  nebivolol take 2 tablets by mouth daily   cetirizine 10 MG tablet Commonly known as:  ZYRTEC Take 10 mg by mouth daily.   chlorthalidone 25 MG tablet Commonly known as:  HYGROTON Take 1 tablet (25 mg total) by mouth daily.   COMPLERA 200-25-300 MG tablet Generic drug:  emtricitabine-rilpivir-tenofovir DF take 1 tablet by mouth once daily   furosemide 20 MG tablet Commonly known as:  LASIX Take 1 tablet (20 mg total) by mouth daily.   gabapentin 300 MG capsule Commonly known as:  NEURONTIN take 1 capsule by mouth three times a day   glucose blood test strip Commonly known as:  ACCU-CHEK GUIDE Use to check blood sugar three times per day.   Glycopyrrolate-Formoterol 9-4.8 MCG/ACT Aero Commonly known as:  BEVESPI AEROSPHERE Inhale 2 puffs into the lungs 2 (two) times daily.   Insulin Glargine 100 UNIT/ML Solostar Pen Commonly known as:  LANTUS SOLOSTAR INJECT 65 UNITS SUBCUTANEOUSLY EVERY MORNING AND 60 UNITS EVERY EVENING   irbesartan 300 MG tablet Commonly known as:  AVAPRO take 1 tablet by mouth once daily   linaclotide 72 MCG capsule Commonly known as:  LINZESS Take 1 capsule (72 mcg total) by mouth daily before breakfast.   metFORMIN 500 MG 24 hr tablet Commonly known as:  GLUCOPHAGE-XR 1 tablet with food twice a day for the first week and then 2 tablets twice a day   MULTIPLE VITAMIN PO Take 1 tablet by mouth.   NOVOLOG FLEXPEN 100 UNIT/ML FlexPen Generic drug:  insulin aspart inject 20 to 25 units subcutaneously three times a day with meals   pioglitazone 15 MG tablet Commonly known as:  ACTOS Take 1 tablet (15 mg total) by mouth daily.    pioglitazone 30 MG tablet Commonly known as:  ACTOS take 1 tablet by mouth once daily   pravastatin 40 MG tablet Commonly known as:  PRAVACHOL take 1 tablet by mouth every evening   trazodone  300 MG tablet Commonly known as:  DESYREL Take 1 tablet (300 mg total) by mouth at bedtime.   triamcinolone cream 0.5 % Commonly known as:  KENALOG APPLY 1 APPLICATION TOPICALLY THREE TIME A DAY       Allergies:  Allergies  Allergen Reactions  . Benazepril Cough  . Pravastatin Other (See Comments)    myositis  . Glipizide Nausea Only    Past Medical History:  Diagnosis Date  . Anxiety   . Cataract    right   . Depression   . Diabetes mellitus without complication ([place])   . DM (diabetes mellitus screen)   . HIV disease ([place]) 09/12/1995  . HIV infection ([place])   . Hypercholesterolemia   . Hyperlipidemia   . Hypertension     Past Surgical History:  Procedure Laterality Date  . EYE SURGERY  2005   catarct surgery  . optic lens surgery  2005    Family History  Problem Relation Age of Onset  . Arthritis Mother   . Diabetes Mother   . Heart disease Mother   . Stroke Mother   . Arthritis Father   . Diabetes Father   . Heart disease Father     Social History:  reports that he quit smoking about 7 weeks ago. His smoking use included cigarettes. He smoked 0.25 packs per day. he has never used smokeless tobacco. He reports that he does not drink alcohol or use drugs.  Review of Systems:   Hypertension:  currently on Bystolic  and on Avapro 1/2 tablet daily Chlorthalidone was stopped on his last visit here because of rapidly low blood pressure   Lipids: LDL is controlled with taking pravastatin He has been taking this for quite some time although his PCP told him to stop it because of muscle cramps f    Lab Results  Component Value Date   CHOL 110 01/25/2016   HDL 40.40 01/25/2016   LDLCALC 45 01/25/2016   TRIG 120.0 01/25/2016   CHOLHDL 3 01/25/2016    He  has symptoms of neuropathy with paresthesia in feet and legs , Treated with gabapentin   Depression:  followed by PCP    LABS:  Lab on 11/10/2016  Component Date Value Ref Range Status  . Sodium 11/10/2016 137  135 - 145 mEq/L Final  . Potassium 11/10/2016 4.3  3.5 - 5.1 mEq/L Final  . Chloride 11/10/2016 101  96 - 112 mEq/L Final  . CO2 11/10/2016 29  19 - 32 mEq/L Final  . Glucose, Bld 11/10/2016 215* 70 - 99 mg/dL Final  . BUN 11/10/2016 6  6 - 23 mg/dL Final  . Creatinine, Ser 11/10/2016 0.85  0.40 - 1.50 mg/dL Final  . Calcium 11/10/2016 9.3  8.4 - 10.5 mg/dL Final  . GFR 11/10/2016 118.64  >60.00 mL/min Final  . Fructosamine 11/10/2016 353* 0 - 285 umol/L Final   Comment: Published reference interval for apparently healthy subjects between age 27 and 49 is 25 - 285 umol/L and in a poorly controlled diabetic population is 228 - 563 umol/L with a mean of 396 umol/L.      Examination:   BP 120/70   Pulse 64   Ht 6\' 1"  (1.854 m)   Wt 202 lb 3.2 oz (91.7 kg)   SpO2 97%   BMI 26.68 kg/m   Body mass index is 26.68 kg/m.   1+ lower leg edema especially left ankle  ASSESSMENT/ PLAN:    Diabetes type 2:  See history of present illness for detailed discussion of his current management, blood sugar patterns and problems identified  He has been fairly insulin resistant and requiring large doses of insulin especially basal More recently with starting back on Actos which had been stopped by his PCP his blood sugars are starting to come down, may also be benefiting from adding 1000 mg of metformin ER now as well as increasing basal insulin 10-15 units and mealtime insulin 5-10 units However still not well controlled with recent blood sugars not consistently below 200  Recommendations: Increase evening Lantus to 80 units Increase mealtime dose by 5 units when blood sugars are over 200 Increase metformin up to 1500 mg now and 2000 mg next week  EDEMA: This is mild but may  increase with continuing Actos; he will start taking Lasix 20 mg 3 times a week  Hypertension  blood pressure is back to normal, previously low with adding chlorthalidone and having high blood sugars He will continue to take only half tablet of the Avapro  Patient Instructions  Take Furosemide 3x weekly  Metformin 2 in pm then in 1 week take 2, twice daily  Lantus 80 in pm, 75 in am  For sugars > 200 extra 5 Novolog      Miliano Cotten 11/15/2016, 1:51 PM         ..

## 2016-11-15 NOTE — Telephone Encounter (Signed)
Patient has some question about his medication. Need clarification. Please advise

## 2016-11-15 NOTE — Patient Instructions (Addendum)
Take Furosemide 3x weekly  Metformin 2 in pm then in 1 week take 2, twice daily  Lantus 80 in pm, 75 in am  For sugars > 200 extra 5 Novolog

## 2016-11-15 NOTE — Telephone Encounter (Signed)
Called patient and he stated that he did not get all of his after care paperwork and I have printed out his visit paperwork and highlighted his medication dosages and he will pick up at the front desk.

## 2016-11-16 ENCOUNTER — Other Ambulatory Visit: Payer: Self-pay | Admitting: *Deleted

## 2016-11-16 NOTE — Patient Outreach (Signed)
[place] [facility]) Care Management  11/16/2016  Nicholas Whitehead 1955-02-25 [id number]   RN Health Coach received return telephone call from patient. Per patient he just wanted to give me a brief call but would call me back on Monday. His brother is on his way to pick him up to take him to pick up his instructions from the Dr office on taking his medications. . Per patient his fasting blood sugar is 156 this morning.   Plan: Patient will call RN back on [place] Management [phone]

## 2016-11-16 NOTE — Patient Outreach (Signed)
[place] [facility]) Care Management  11/16/2016  Nicholas Whitehead 26-May-1955 [id number]   RN Health Coach attempted #2 follow up outreach call to patient.  Patient was unavailable. HIPPA compliance voicemail message left with return callback number.  Plan: RN will call patient again within 14 days.  [place] Care Management [phone]

## 2016-11-17 ENCOUNTER — Other Ambulatory Visit: Payer: Self-pay | Admitting: Endocrinology

## 2016-11-20 ENCOUNTER — Other Ambulatory Visit: Payer: Self-pay | Admitting: *Deleted

## 2016-11-20 NOTE — Patient Outreach (Signed)
[place] [facility]) Care Management  11/20/2016   Nicholas Whitehead 12/07/1955 [id number]  RN Health Coach telephone call to patient.  [employer] compliance verified. Per patient his fasting blood sugar was 142. Patient stated he is feeling so much better. Patient  was able to verbalize the the new changes in his insulin. Patient has discussed upcoming appointment with PCP. Patient stated his appetite is better. Patient has been getting out with family. Patient has agreed to follow up outreach calls.  Current Medications:  Current Outpatient Medications  Medication Sig Dispense Refill  . ACCU-CHEK FASTCLIX LANCETS MISC USE  TO CHECK BLOOD SUGAR THREE TIMES DAILY 306 each 1  . albuterol (PROVENTIL HFA;VENTOLIN HFA) 108 (90 Base) MCG/ACT inhaler Inhale 2 puffs into the lungs every 6 (six) hours as needed for wheezing or shortness of breath. 1 Inhaler 5  . aspirin EC 81 MG tablet Take 81 mg by mouth every morning.    . B-D ULTRAFINE III SHORT PEN 31G X 8 MM MISC use as directed five times a day 300 each 3  . BYSTOLIC 10 MG tablet take 2 tablets by mouth daily 60 tablet 11  . cetirizine (ZYRTEC) 10 MG tablet Take 10 mg by mouth daily.    . chlorthalidone (HYGROTON) 25 MG tablet Take 1 tablet (25 mg total) by mouth daily. (Patient not taking: Reported on 11/15/2016) 90 tablet 1  . COMPLERA 200-25-300 MG tablet take 1 tablet by mouth once daily 30 tablet 4  . furosemide (LASIX) 20 MG tablet Take 1 tablet (20 mg total) by mouth daily. 30 tablet 3  . gabapentin (NEURONTIN) 300 MG capsule take 1 capsule by mouth three times a day 90 capsule 3  . glucose blood (ACCU-CHEK GUIDE) test strip Use to check blood sugar three times per day. 200 each 2  . Glycopyrrolate-Formoterol (BEVESPI AEROSPHERE) 9-4.8 MCG/ACT AERO Inhale 2 puffs into the lungs 2 (two) times daily. 10.7 g 11  . Insulin Glargine (LANTUS SOLOSTAR) 100 UNIT/ML Solostar Pen INJECT 65 UNITS SUBCUTANEOUSLY EVERY MORNING AND 60 UNITS EVERY  EVENING (Patient taking differently: INJECT 75 UNITS SUBCUTANEOUSLY EVERY MORNING AND 75 UNITS EVERY EVENING) 30 mL 5  . irbesartan (AVAPRO) 300 MG tablet take 1 tablet by mouth once daily (Patient taking differently: Take 1/2 tablet daily) 90 tablet 3  . linaclotide (LINZESS) 72 MCG capsule Take 1 capsule (72 mcg total) by mouth daily before breakfast. 90 capsule 1  . metFORMIN (GLUCOPHAGE-XR) 500 MG 24 hr tablet 1 tablet with food twice a day for the first week and then 2 tablets twice a day 120 tablet 3  . MULTIPLE VITAMIN PO Take 1 tablet by mouth.    Marland Kitchen NOVOLOG FLEXPEN 100 UNIT/ML FlexPen inject 20 to 25 units subcutaneously three times a day with meals 15 mL 1  . pioglitazone (ACTOS) 15 MG tablet Take 1 tablet (15 mg total) by mouth daily. 30 tablet 3  . pioglitazone (ACTOS) 30 MG tablet take 1 tablet by mouth once daily 30 tablet 0  . pravastatin (PRAVACHOL) 40 MG tablet take 1 tablet by mouth every evening (Patient not taking: Reported on 11/01/2016) 90 tablet 0  . trazodone (DESYREL) 300 MG tablet Take 1 tablet (300 mg total) by mouth at bedtime. 90 tablet 3  . triamcinolone cream (KENALOG) 0.5 % APPLY 1 APPLICATION TOPICALLY THREE TIME A DAY 100 g 1   No current facility-administered medications for this visit.     Functional Status:  In your present state of health,  do you have any difficulty performing the following activities: 10/05/2016 08/22/2016  Hearing? N N  Vision? Y N  Comment per patient his eyes get tired and it is hard to focus -  Difficulty concentrating or making decisions? Y N  Walking or climbing stairs? N Y  Dressing or bathing? N N  Doing errands, shopping? N N  Preparing Food and eating ? N N  Using the Toilet? N N  In the past six months, have you accidently leaked urine? N N  Do you have problems with loss of bowel control? N N  Managing your Medications? Y N  Managing your Finances? N N  Housekeeping or managing your Housekeeping? N N  Some recent data  might be hidden    Fall/Depression Screening: Fall Risk  11/20/2016 10/13/2016 10/05/2016  Falls in the past year? No Yes No  Comment - "blacked out"  going to bathroom -  Number falls in past yr: - 1 -  Injury with Fall? - No -  Risk Factor Category  - - -  Risk for fall due to : - History of fall(s);Medication side effect -  Follow up - Falls prevention discussed;Education provided -   PHQ 2/9 Scores 11/20/2016 10/13/2016 10/07/2016 10/05/2016 08/30/2016 08/22/2016 06/27/2016  PHQ - 2 Score 1 1 3 2 2 4 2   PHQ- 9 Score 3 3 13 10  - 12 3   THN CM Care Plan Problem One     Most Recent Value  Care Plan Problem One  Knowledge Deficit in self management of Diabetes  Role Documenting the Problem One  [place] for Problem One  Active  THN Long Term Goal   Patient will see A1C decrease with next blood draw within the next 90 days  THN Long Term Goal Start Date  11/20/16  Interventions for Problem One Long Term Goal  RN discussed fasting blood sugar levels . Patient received educational material on living well with diabetes . RN follows up with discussion and teach back  THN CM Short Term Goal #1   Patient will know the signs and symptoms of hypo and hyperglycemia within the next 30 days  THN CM Short Term Goal #1 Start Date  11/20/16  Interventions for Short Term Goal #1  RN discussed signs and symptoms of hypo and hyperglycemia. RN sent educational material of pictures of hypo and hyperglycemia. RN sent action plan for symptoms. RN will follow up discussion of teachback  THN CM Short Term Goal #2   Patient will document blood sugars daily within the next 30 days  Interventions for Short Term Goal #2  RN discussed documenting blood sugars. Patient reported receiving calendar book to document blood sugars  THN CM Short Term Goal #3  Patient will  report medication adherence within the next 30 days  THN CM Short Term Goal #3 Start Date  11/20/16  Interventions for Short Tern Goal #3  RN  discussed the importance of taking medications as prescribed. RN referred to pharmacy for patient explanation of medications.        Assessment:  Patient fasting blood sugar was 142.  Patient voiced understanding of insulin changes Patient will continue to benefit from [place] telephonic outreach for education and support for diabetes self management.  Plan Rn discussed with patient about new diabetic shoes Patient is taking insulin as per Dr ordered Patient is trying to eat better Patient has a follow up appointment with PCP RN will follow up  outreach with patient within the month of December  Nicholas Whitehead [place] Care Management [phone]

## 2016-11-21 ENCOUNTER — Other Ambulatory Visit: Payer: Self-pay | Admitting: *Deleted

## 2016-11-21 DIAGNOSIS — B20 Human immunodeficiency virus [HIV] disease: Secondary | ICD-10-CM

## 2016-11-21 MED ORDER — EMTRICITAB-RILPIVIR-TENOFOV DF 200-25-300 MG PO TABS: 1.0000 | ORAL_TABLET | Freq: Every day | ORAL | 3 refills | Status: DC

## 2016-11-22 ENCOUNTER — Encounter: Payer: Self-pay | Admitting: Internal Medicine

## 2016-11-22 ENCOUNTER — Ambulatory Visit: Payer: Medicare HMO | Admitting: Internal Medicine

## 2016-11-22 ENCOUNTER — Other Ambulatory Visit: Payer: Self-pay | Admitting: *Deleted

## 2016-11-22 VITALS — BP 146/70 | HR 59 | Temp 98.4°F | Resp 16 | Ht 73.0 in | Wt 196.0 lb

## 2016-11-22 DIAGNOSIS — F5105 Insomnia due to other mental disorder: Secondary | ICD-10-CM | POA: Diagnosis not present

## 2016-11-22 DIAGNOSIS — F409 Phobic anxiety disorder, unspecified: Secondary | ICD-10-CM

## 2016-11-22 DIAGNOSIS — I10 Essential (primary) hypertension: Secondary | ICD-10-CM | POA: Diagnosis not present

## 2016-11-22 NOTE — Patient Instructions (Signed)

## 2016-11-22 NOTE — Progress Notes (Signed)
Subjective:  Patient ID: Nicholas Whitehead, male    DOB: 08-23-1955  Age: 61 y.o. MRN: [id number]  CC: Medication Adherence (Patient is here today to follow-up on medication changes.) and Hypertension   HPI Nicholas Whitehead presents for a BP check.  He complains of insomnia with difficulty falling asleep and staying asleep.  He tells me his blood sugars and blood pressures have been well controlled recently.  He's haid no episodes of chest pain or shortness of breath.  Outpatient Medications Prior to Visit  Medication Sig Dispense Refill  . ACCU-CHEK FASTCLIX LANCETS MISC USE  TO CHECK BLOOD SUGAR THREE TIMES DAILY 306 each 1  . albuterol (PROVENTIL HFA;VENTOLIN HFA) 108 (90 Base) MCG/ACT inhaler Inhale 2 puffs into the lungs every 6 (six) hours as needed for wheezing or shortness of breath. 1 Inhaler 5  . aspirin EC 81 MG tablet Take 81 mg by mouth every morning.    . B-D ULTRAFINE III SHORT PEN 31G X 8 MM MISC use as directed five times a day 300 each 3  . BYSTOLIC 10 MG tablet take 2 tablets by mouth daily 60 tablet 11  . cetirizine (ZYRTEC) 10 MG tablet Take 10 mg by mouth daily.    Marland Kitchen emtricitabine-rilpivir-tenofovir DF (COMPLERA) 200-25-300 MG tablet Take 1 tablet daily by mouth. 30 tablet 3  . furosemide (LASIX) 20 MG tablet Take 1 tablet (20 mg total) by mouth daily. 30 tablet 3  . gabapentin (NEURONTIN) 300 MG capsule take 1 capsule by mouth three times a day 90 capsule 3  . glucose blood (ACCU-CHEK GUIDE) test strip Use to check blood sugar three times per day. 200 each 2  . Glycopyrrolate-Formoterol (BEVESPI AEROSPHERE) 9-4.8 MCG/ACT AERO Inhale 2 puffs into the lungs 2 (two) times daily. 10.7 g 11  . Insulin Glargine (LANTUS SOLOSTAR) 100 UNIT/ML Solostar Pen INJECT 65 UNITS SUBCUTANEOUSLY EVERY MORNING AND 60 UNITS EVERY EVENING (Patient taking differently: INJECT 75 UNITS SUBCUTANEOUSLY EVERY MORNING AND 75 UNITS EVERY EVENING) 30 mL 5  . irbesartan (AVAPRO) 300 MG tablet take 1  tablet by mouth once daily (Patient taking differently: Take 1/2 tablet daily) 90 tablet 3  . linaclotide (LINZESS) 72 MCG capsule Take 1 capsule (72 mcg total) by mouth daily before breakfast. 90 capsule 1  . metFORMIN (GLUCOPHAGE-XR) 500 MG 24 hr tablet 1 tablet with food twice a day for the first week and then 2 tablets twice a day 120 tablet 3  . MULTIPLE VITAMIN PO Take 1 tablet by mouth.    Marland Kitchen NOVOLOG FLEXPEN 100 UNIT/ML FlexPen inject 20 to 25 units subcutaneously three times a day with meals 15 mL 1  . pioglitazone (ACTOS) 30 MG tablet take 1 tablet by mouth once daily 30 tablet 0  . trazodone (DESYREL) 300 MG tablet Take 1 tablet (300 mg total) by mouth at bedtime. 90 tablet 3  . triamcinolone cream (KENALOG) 0.5 % APPLY 1 APPLICATION TOPICALLY THREE TIME A DAY 100 g 1  . chlorthalidone (HYGROTON) 25 MG tablet Take 1 tablet (25 mg total) by mouth daily. (Patient not taking: Reported on 11/15/2016) 90 tablet 1  . pioglitazone (ACTOS) 15 MG tablet Take 1 tablet (15 mg total) by mouth daily. 30 tablet 3  . pravastatin (PRAVACHOL) 40 MG tablet take 1 tablet by mouth every evening (Patient not taking: Reported on 11/01/2016) 90 tablet 0   No facility-administered medications prior to visit.     ROS Review of Systems  Constitutional: Negative.  Negative for  chills, diaphoresis, fatigue and fever.  HENT: Negative.  Negative for sore throat and trouble swallowing.   Eyes: Negative.   Respiratory: Negative.  Negative for cough, chest tightness, shortness of breath and wheezing.   Cardiovascular: Negative for chest pain, palpitations and leg swelling.  Gastrointestinal: Negative for abdominal pain, diarrhea, nausea and vomiting.  Endocrine: Negative.   Genitourinary: Negative.   Musculoskeletal: Negative.  Negative for back pain, myalgias and neck pain.  Skin: Negative.  Negative for color change and rash.  Allergic/Immunologic: Negative.   Neurological: Negative.   Hematological: Negative  for adenopathy. Does not bruise/bleed easily.  Psychiatric/Behavioral: Positive for sleep disturbance. Negative for agitation, behavioral problems, confusion, decreased concentration and dysphoric mood. The patient is nervous/anxious.     Objective:  BP (!) 146/70 (BP Location: Left Arm, Patient Position: Sitting, Cuff Size: Normal)   Pulse (!) 59   Temp 98.4 F (36.9 C) (Oral)   Resp 16   Ht 6\' 1"  (1.854 m)   Wt 196 lb 0.6 oz (88.9 kg)   SpO2 97%   BMI 25.86 kg/m   BP Readings from Last 3 Encounters:  11/22/16 (!) 146/70  11/15/16 120/70  10/27/16 90/60    Wt Readings from Last 3 Encounters:  11/22/16 196 lb 0.6 oz (88.9 kg)  11/15/16 202 lb 3.2 oz (91.7 kg)  10/27/16 191 lb 2 oz (86.7 kg)    Physical Exam  Constitutional: He is oriented to person, place, and time. No distress.  HENT:  Mouth/Throat: Oropharynx is clear and moist. No oropharyngeal exudate.  Eyes: Conjunctivae are normal. Right eye exhibits no discharge. Left eye exhibits no discharge. No scleral icterus.  Neck: Normal range of motion. Neck supple. No JVD present. No thyromegaly present.  Cardiovascular: Normal rate, regular rhythm and intact distal pulses. Exam reveals no gallop.  No murmur heard. Pulmonary/Chest: Effort normal and breath sounds normal. No respiratory distress. He has no wheezes. He has no rales. He exhibits no tenderness.  Abdominal: Soft. Bowel sounds are normal. He exhibits no distension and no mass. There is no tenderness. There is no rebound and no guarding.  Musculoskeletal: Normal range of motion. He exhibits no edema, tenderness or deformity.  Lymphadenopathy:    He has no cervical adenopathy.  Neurological: He is alert and oriented to person, place, and time.  Skin: Skin is warm and dry. No rash noted. He is not diaphoretic. No erythema. No pallor.  Psychiatric: He has a normal mood and affect. His behavior is normal. Judgment and thought content normal.  Vitals reviewed.   Lab  Results  Component Value Date   WBC 11.0 (H) 09/11/2016   HGB 17.3 (H) 09/11/2016   HCT 47.1 09/11/2016   PLT 163 09/11/2016   GLUCOSE 215 (H) 11/10/2016   CHOL 110 01/25/2016   TRIG 120.0 01/25/2016   HDL 40.40 01/25/2016   LDLCALC 45 01/25/2016   ALT 26 10/24/2016   AST 31 10/24/2016   NA 137 11/10/2016   K 4.3 11/10/2016   CL 101 11/10/2016   CREATININE 0.85 11/10/2016   BUN 6 11/10/2016   CO2 29 11/10/2016   TSH 1.04 10/24/2016   PSA 0.82 03/25/2015   HGBA1C 8.3 (H) 08/22/2016   MICROALBUR 0.8 10/24/2016    Dg Abdomen 1 View  Result Date: 09/11/2016 CLINICAL DATA:  Abdominal pain.  Constipation. EXAM: ABDOMEN - 1 VIEW COMPARISON:  None. FINDINGS: No dilated small bowel loops. Mild gas and stool in the large bowel. No evidence of pneumatosis or  pneumoperitoneum. No radiopaque urolithiasis. Mild lumbar spondylosis. IMPRESSION: Nonobstructive bowel gas pattern.  Mild colonic stool burden. Electronically Signed   By: Ilona Sorrel M.D.   On: 09/11/2016 14:28    Assessment & Plan:   Nicholas Whitehead was seen today for medication adherence and hypertension.  Diagnoses and all orders for this visit:  Essential hypertension- His blood pressure is well controlled.  Insomnia due to anxiety and fear- Will try as eszopiclone to treat the insomnia. -     Eszopiclone 3 MG TABS; Take 1 tablet (3 mg total) at bedtime by mouth. Take immediately before bedtime   I have discontinued Orvill Loretto's chlorthalidone and pravastatin. I am also having him start on Eszopiclone. Additionally, I am having him maintain his cetirizine, aspirin EC, ACCU-CHEK FASTCLIX LANCETS, triamcinolone cream, BYSTOLIC, trazodone, Glycopyrrolate-Formoterol, irbesartan, glucose blood, B-D ULTRAFINE III SHORT PEN, MULTIPLE VITAMIN PO, albuterol, linaclotide, Insulin Glargine, furosemide, gabapentin, pioglitazone, metFORMIN, NOVOLOG FLEXPEN, and emtricitabine-rilpivir-tenofovir DF.  Meds ordered this encounter  Medications  .  Eszopiclone 3 MG TABS    Sig: Take 1 tablet (3 mg total) at bedtime by mouth. Take immediately before bedtime    Dispense:  30 tablet    Refill:  5     Follow-up: Return in about 6 months (around 05/22/2017).  Scarlette Calico, MD

## 2016-11-23 MED ORDER — ESZOPICLONE 3 MG PO TABS: 3.0000 mg | ORAL_TABLET | Freq: Every day | ORAL | 5 refills | Status: DC

## 2016-11-27 ENCOUNTER — Telehealth: Payer: Self-pay | Admitting: Internal Medicine

## 2016-11-27 NOTE — Telephone Encounter (Signed)
Please advise in PCP absence.  

## 2016-11-27 NOTE — Telephone Encounter (Signed)
Patient states he does not know where the script from October is at.  Patient states he did not know where he was at in October.  Patient requesting another script.

## 2016-11-27 NOTE — Telephone Encounter (Signed)
LVM for pt to call back as soon as possible.   RE: need to know if pt pick up rx written in 10/2016 for #90.

## 2016-11-27 NOTE — Telephone Encounter (Signed)
It appears 6 month supply sent in October. Shouldn't need refilled but is okay to do if needed.

## 2016-11-27 NOTE — Telephone Encounter (Signed)
Rx has been called in  

## 2016-11-27 NOTE — Telephone Encounter (Signed)
linaclotide (LINZESS) 72 MCG capsule  [id number] [place], [address] [phone] (Phone) [phone] (Fax)    Patient is requesting a refill.

## 2016-12-07 ENCOUNTER — Other Ambulatory Visit: Payer: Self-pay | Admitting: Endocrinology

## 2016-12-08 ENCOUNTER — Ambulatory Visit: Payer: Self-pay | Admitting: *Deleted

## 2016-12-12 ENCOUNTER — Ambulatory Visit: Payer: Self-pay | Admitting: *Deleted

## 2016-12-13 ENCOUNTER — Other Ambulatory Visit: Payer: Self-pay | Admitting: *Deleted

## 2016-12-19 ENCOUNTER — Other Ambulatory Visit: Payer: Self-pay | Admitting: *Deleted

## 2016-12-20 NOTE — Patient Outreach (Signed)
[facility]) Care Management  12/20/2016   [name] 1955/03/05 [id number]  Subjective: RN Health Coach telephone call to patient.  [employer] compliance verified. Per patient he is feeling so much better. Patient blood sugar was 145 today after breakfast. Patient  Stated he is going to walk over to [employer] to get some apples. Patient stated his blood sugars after meals have not been up to 200. He stated fasting is usually 90-120. Patient has not fallen. Patient does not have any open wounds on feet. Patient is trying to get new diabetic shoes. Information has been sent. Patient has not had any hypo or hyperglycemic reactions since last outreach call. Patient stated that he went out of town to visit family for Holidays.  Patient has agreed to follow up outreach calls.   Current Medications:  Current Outpatient Medications  Medication Sig Dispense Refill  . ACCU-CHEK FASTCLIX LANCETS MISC USE  TO CHECK BLOOD SUGAR THREE TIMES DAILY 306 each 1  . ACCU-CHEK GUIDE test strip USE TO CHECK BLOOD SUGAR 3 TIMES DAILY. 200 each 2  . albuterol (PROVENTIL HFA;VENTOLIN HFA) 108 (90 Base) MCG/ACT inhaler Inhale 2 puffs into the lungs every 6 (six) hours as needed for wheezing or shortness of breath. 1 Inhaler 5  . aspirin EC 81 MG tablet Take 81 mg by mouth every morning.    . B-D ULTRAFINE III SHORT PEN 31G X 8 MM MISC use as directed five times a day 300 each 3  . BYSTOLIC 10 MG tablet take 2 tablets by mouth daily 60 tablet 11  . cetirizine (ZYRTEC) 10 MG tablet Take 10 mg by mouth daily.    Marland Kitchen emtricitabine-rilpivir-tenofovir DF (COMPLERA) 200-25-300 MG tablet Take 1 tablet daily by mouth. 30 tablet 3  . Eszopiclone 3 MG TABS Take 1 tablet (3 mg total) at bedtime by mouth. Take immediately before bedtime 30 tablet 5  . furosemide (LASIX) 20 MG tablet Take 1 tablet (20 mg total) by mouth daily. 30 tablet 3  . gabapentin (NEURONTIN) 300 MG capsule take 1 capsule by mouth three times  a day 90 capsule 3  . Glycopyrrolate-Formoterol (BEVESPI AEROSPHERE) 9-4.8 MCG/ACT AERO Inhale 2 puffs into the lungs 2 (two) times daily. 10.7 g 11  . Insulin Glargine (LANTUS SOLOSTAR) 100 UNIT/ML Solostar Pen INJECT 65 UNITS SUBCUTANEOUSLY EVERY MORNING AND 60 UNITS EVERY EVENING (Patient taking differently: INJECT 75 UNITS SUBCUTANEOUSLY EVERY MORNING AND 75 UNITS EVERY EVENING) 30 mL 5  . irbesartan (AVAPRO) 300 MG tablet take 1 tablet by mouth once daily (Patient taking differently: Take 1/2 tablet daily) 90 tablet 3  . linaclotide (LINZESS) 72 MCG capsule Take 1 capsule (72 mcg total) by mouth daily before breakfast. 90 capsule 1  . metFORMIN (GLUCOPHAGE-XR) 500 MG 24 hr tablet 1 tablet with food twice a day for the first week and then 2 tablets twice a day 120 tablet 3  . MULTIPLE VITAMIN PO Take 1 tablet by mouth.    Marland Kitchen NOVOLOG FLEXPEN 100 UNIT/ML FlexPen inject 20 to 25 units subcutaneously three times a day with meals 15 mL 1  . pioglitazone (ACTOS) 30 MG tablet take 1 tablet by mouth once daily 30 tablet 0  . trazodone (DESYREL) 300 MG tablet Take 1 tablet (300 mg total) by mouth at bedtime. 90 tablet 3  . triamcinolone cream (KENALOG) 0.5 % APPLY 1 APPLICATION TOPICALLY THREE TIME A DAY 100 g 1   No current facility-administered medications for this visit.  Functional Status:  In your present state of health, do you have any difficulty performing the following activities: 12/20/2016 10/05/2016  Hearing? N N  Vision? Y Y  Comment - per patient his eyes get tired and it is hard to focus  Difficulty concentrating or making decisions? Tempie Donning  Walking or climbing stairs? N N  Dressing or bathing? N N  Doing errands, shopping? N N  Preparing Food and eating ? N N  Using the Toilet? N N  In the past six months, have you accidently leaked urine? N N  Do you have problems with loss of bowel control? N N  Managing your Medications? Y Y  Managing your Finances? N N  Housekeeping or  managing your Housekeeping? N N  Some recent data might be hidden    Fall/Depression Screening: Fall Risk  12/20/2016 11/20/2016 10/13/2016  Falls in the past year? No No Yes  Comment - - "blacked out"  going to bathroom  Number falls in past yr: - - 1  Injury with Fall? - - No  Risk Factor Category  - - -  Risk for fall due to : - - History of fall(s);Medication side effect  Follow up - - Falls prevention discussed;Education provided   [facility] 2/9 Scores 12/20/2016 11/20/2016 10/13/2016 10/07/2016 10/05/2016 08/30/2016 08/22/2016  PHQ - 2 Score _0 PHQ- 9 Score _1 - 12   THN CM Care Plan Problem One     Most Recent Value  Care Plan Problem One  Knowledge Deficit in self management of Diabetes  Role Documenting the Problem One  [place] for Problem One  Active  THN Long Term Goal   Patient will see A1C decrease with next blood draw within the next 90 days  THN Long Term Goal Start Date  12/20/16  Interventions for Problem One Long Term Goal  RN discussed fasting blood sugar levels . Patient received educational material on living well with diabetes . RN follows up with discussion and teach back  THN CM Short Term Goal #1   Patient will know the signs and symptoms of hypo and hyperglycemia within the next 30 days  THN CM Short Term Goal #1 Met Date  12/20/16  Interventions for Short Term Goal #1  RN discussed signs and symptoms of hypo and hyperglycemia. RN sent educational material of pictures of hypo and hyperglycemia. RN sent action plan for symptoms. RN will follow up discussion of teachback  THN CM Short Term Goal #2   Patient will document blood sugars daily within the next 30 days  THN CM Short Term Goal #2 Start Date  12/20/16  Interventions for Short Term Goal #2  RN discussed documenting blood sugars. Patient reported receiving calendar book to document blood sugars  THN CM Short Term Goal #3  Patient will  report medication adherence within the next  30 days  THN CM Short Term Goal #3 Start Date  11/20/16  [facility] CM Short Term Goal #3 Met Date  12/20/16  Interventions for Short Tern Goal #3  RN discussed the importance of taking medications as prescribed. RN referred to pharmacy for patient explanation of medications.   THN CM Short Term Goal #4  Patient reported receiving information on diabetic shoes within the next 30 days  THN CM Short Term Goal #4 Start Date  12/20/16  Interventions for Short Term Goal #4  RN discussed with patient about  getting diabetic shoes that fit. RN will follow up with patient on receiving information       Assessment:  Blood sugar 145 after breakfast No recent falls No hyper or hypoglycemic reactions Patient will continue to benefit from [place] telephonic outreach for education and support for diabetes self management. Plan:  RN discussed proper fitting diabetic shoes RN discussed exercise RN discussed medication adherence RN Health Coach will follow up outreach within the month of January  Nicholas Whitehead Management [phone]

## 2016-12-21 NOTE — Patient Outreach (Signed)
[facility] Care Management  12/21/2016  Nicholas Whitehead 26-Jul-1955 [id number]     CSW contacted patient to assist with contacting his insurance to seek providers to consider for counseloing and patient  reported the snow has him not feeling well and asked that I call back next week. He denies any concerns or needs, just that he is feeling "some kinda way with the snow".    CSW will call again next week for further assessment and support.    Eduard Clos, MSW, [place] Worker  [place] [phone]

## 2016-12-26 ENCOUNTER — Other Ambulatory Visit (INDEPENDENT_AMBULATORY_CARE_PROVIDER_SITE_OTHER): Payer: Medicare HMO

## 2016-12-26 DIAGNOSIS — Z794 Long term (current) use of insulin: Secondary | ICD-10-CM

## 2016-12-26 DIAGNOSIS — E1165 Type 2 diabetes mellitus with hyperglycemia: Secondary | ICD-10-CM

## 2016-12-27 LAB — BASIC METABOLIC PANEL
BUN: 9 mg/dL (ref 6–23)
CO2: 30 mEq/L (ref 19–32)
Calcium: 9.5 mg/dL (ref 8.4–10.5)
Chloride: 104 mEq/L (ref 96–112)
Creatinine, Ser: 0.82 mg/dL (ref 0.40–1.50)
GFR: 123.61 mL/min (ref >60.00)
GLUCOSE: 140 mg/dL — AB (ref 70–99)
POTASSIUM: 4.8 meq/L (ref 3.5–5.1)
Sodium: 141 mEq/L (ref 135–145)

## 2016-12-27 LAB — HEMOGLOBIN A1C: Hgb A1c MFr Bld: 7.9 % — ABNORMAL HIGH (ref 4.6–6.5)

## 2016-12-27 LAB — LIPID PANEL
CHOLESTEROL: 131 mg/dL (ref 0–200)
HDL: 45.1 mg/dL (ref >39.00)
LDL CALC: 71 mg/dL (ref 0–99)
NonHDL: 85.75
TRIGLYCERIDES: 75 mg/dL (ref 0.0–149.0)
Total CHOL/HDL Ratio: 3
VLDL: 15 mg/dL (ref 0.0–40.0)

## 2016-12-28 ENCOUNTER — Ambulatory Visit (INDEPENDENT_AMBULATORY_CARE_PROVIDER_SITE_OTHER): Payer: Medicare HMO | Admitting: Internal Medicine

## 2016-12-28 ENCOUNTER — Encounter: Payer: Self-pay | Admitting: Internal Medicine

## 2016-12-28 VITALS — BP 148/87 | HR 60 | Temp 98.1°F | Ht 73.0 in | Wt 191.0 lb

## 2016-12-28 DIAGNOSIS — Z720 Tobacco use: Secondary | ICD-10-CM | POA: Insufficient documentation

## 2016-12-28 DIAGNOSIS — F32A Depression, unspecified: Secondary | ICD-10-CM

## 2016-12-28 DIAGNOSIS — F419 Anxiety disorder, unspecified: Secondary | ICD-10-CM | POA: Diagnosis not present

## 2016-12-28 DIAGNOSIS — F329 Major depressive disorder, single episode, unspecified: Secondary | ICD-10-CM

## 2016-12-28 DIAGNOSIS — B20 Human immunodeficiency virus [HIV] disease: Secondary | ICD-10-CM | POA: Diagnosis not present

## 2016-12-28 NOTE — Progress Notes (Signed)
   Subjective:    Patient ID: Nicholas Whitehead, male    DOB: 14-Dec-1955, 61 y.o.   MRN: [id number]  HPI Here for follow up of HIV Has been on Complera and denies any missed doses.  No labs prior to the visit.  No new issues.  Has had continued issues with depression, anxiety.  Stopped his medications for that at one point but back on now.  Still some low mood but no si.  No specific complaints today.  No associated n/v/d.    Review of Systems  Constitutional: Negative for fatigue.  Gastrointestinal: Negative for diarrhea.  Skin: Negative for rash.       Objective:   Physical Exam  Constitutional: He appears well-developed and well-nourished. No distress.  HENT:  Mouth/Throat: No oropharyngeal exudate.  Cardiovascular: Normal rate, regular rhythm and normal heart sounds.  No murmur heard. Pulmonary/Chest: Effort normal and breath sounds normal. No respiratory distress.  Skin: No rash noted.   SH: quit smoking in September       Assessment & Plan:

## 2016-12-28 NOTE — Assessment & Plan Note (Signed)
Congratulated him on quitting 

## 2016-12-28 NOTE — Assessment & Plan Note (Signed)
Doing well.  No changes today but will consider Odefesy or Biktarvy next visit.   Labs today and rtc 4 months

## 2016-12-28 NOTE — Assessment & Plan Note (Signed)
I offered meeting our counselor here but he declined at this time.  He may call back and schedule with Marcie Bal.

## 2016-12-29 ENCOUNTER — Ambulatory Visit: Payer: Self-pay | Admitting: *Deleted

## 2016-12-29 ENCOUNTER — Encounter: Payer: Self-pay | Admitting: Endocrinology

## 2016-12-29 ENCOUNTER — Ambulatory Visit (INDEPENDENT_AMBULATORY_CARE_PROVIDER_SITE_OTHER): Payer: Medicare HMO | Admitting: Endocrinology

## 2016-12-29 VITALS — BP 128/82 | HR 63 | Ht 73.0 in | Wt 194.0 lb

## 2016-12-29 DIAGNOSIS — I10 Essential (primary) hypertension: Secondary | ICD-10-CM

## 2016-12-29 DIAGNOSIS — E1165 Type 2 diabetes mellitus with hyperglycemia: Secondary | ICD-10-CM

## 2016-12-29 DIAGNOSIS — Z794 Long term (current) use of insulin: Secondary | ICD-10-CM | POA: Diagnosis not present

## 2016-12-29 LAB — T-HELPER CELL (CD4) - (RCID CLINIC ONLY)
CD4 % Helper T Cell: 29 % — ABNORMAL LOW (ref 33–55)
CD4 T Cell Abs: 990 /uL (ref 400–2700)

## 2016-12-29 MED ORDER — INSULIN GLARGINE 300 UNIT/ML ~~LOC~~ SOPN: 80.0000 [IU] | PEN_INJECTOR | Freq: Two times a day (BID) | SUBCUTANEOUS | 3 refills | Status: DC

## 2016-12-29 NOTE — Patient Instructions (Addendum)
lantus 80 in am and adjust to keep supper sugar between 100-150  Take 2 Metformin in am and in pm

## 2016-12-29 NOTE — Progress Notes (Signed)
Patient ID: Nicholas Whitehead, male   DOB: Jul 18, 1955, 61 y.o.   MRN: [id number]   Reason for Appointment: Type II Diabetes follow-up   History of Present Illness   Diagnosis date: 1998    Previous history: He was started on oral agents at diagnosis and around 2006 was put on insulin His blood sugars are usually well controlled when he takes Actos along with his insulin. He had been on Invokana since 04/2013  Recent history:   Insulin regimen: Lantus 70 units bid;  Novolog  30 AC tid         Oral hypoglycemic drugs: Metformin ER 500 mg twice a day, Actos 30 mg   He has usually been requiring large doses of insulin especially  Lantus which he takes twice a day    A1c has been consistently high, now continuing to improve that 7.9 and previously as high as 11.1  Current management, blood sugar patterns and problems:  He did not make any changes in his insulin and metformin regimen as directed on the last visit despite giving written instructions  He still taking 70 units of Lantus and was told to increase the evening dose to 80 units  However his FASTING readings are better, occasionally below 100 and previously were averaging nearly 200  Still has fluctuation in his blood sugars throughout the day with overall standard deviation 44  Blood sugars are still consistently high at lunch and supper especially in the evening  Does not do as many readings AFTER meals but he has a few readings after supper which are only occasionally high  No hypoglycemia at any time  Currently still not able to exercise much  No recent edema with taking 30 mg Actos  He was supposed to take 1500 mg of metformin but is only taking 500 twice a day      Side effects from medications:  urgency, frequent urination from Invokana Proper timing of medications in relation to meals: Yes.          Monitors blood glucose:  3.4 times a day  Glucometer:  Accu-Chek  Mean values apply above for all  meters except median for One Touch  PRE-MEAL Fasting Lunch Dinner Bedtime Overall  Glucose range: 97-232  1 58-270   1 60-266   1 52-230    Mean/median: 151   184  188  178           Meals: Breakfast variable, 9-10 AM Lunch 2-3pm  usually avoiding high-fat meals, dinner at about 6 pm         Physical activity: exercise: Very limited  Dietician visit: Most recent: 2013   Weight control:  Wt Readings from Last 3 Encounters:  12/29/16 194 lb (88 kg)  12/28/16 191 lb (86.6 kg)  11/22/16 196 lb 0.6 oz (88.9 kg)          Diabetes labs:  Lab Results  Component Value Date   HGBA1C 7.9 (H) 12/26/2016   HGBA1C 8.3 (H) 08/22/2016   HGBA1C 9.7 (H) 05/02/2016   Lab Results  Component Value Date   MICROALBUR 0.8 10/24/2016   LDLCALC 71 12/26/2016   CREATININE 0.82 12/26/2016    Other active problems discussed today: See review of systems   Appointment on 12/26/2016  Component Date Value Ref Range Status  . Cholesterol 12/26/2016 131  0 - 200 mg/dL Final   ATP III Classification       Desirable:  < 200 mg/dL  Borderline High:  200 - 239 mg/dL          High:  > = 240 mg/dL  . Triglycerides 12/26/2016 75.0  0.0 - 149.0 mg/dL Final   Normal:  <150 mg/dLBorderline High:  150 - 199 mg/dL  . HDL 12/26/2016 45.10  >39.00 mg/dL Final  . VLDL 12/26/2016 15.0  0.0 - 40.0 mg/dL Final  . LDL Cholesterol 12/26/2016 71  0 - 99 mg/dL Final  . Total CHOL/HDL Ratio 12/26/2016 3   Final                  Men          Women1/2 Average Risk     3.4          3.3Average Risk          5.0          4.42X Average Risk          9.6          7.13X Average Risk          15.0          11.0                      . NonHDL 12/26/2016 85.75   Final   NOTE:  Non-HDL goal should be 30 mg/dL higher than patient's LDL goal (i.e. LDL goal of < 70 mg/dL, would have non-HDL goal of < 100 mg/dL)  . Sodium 12/26/2016 141  135 - 145 mEq/L Final  . Potassium 12/26/2016 4.8  3.5 - 5.1 mEq/L Final  . Chloride  12/26/2016 104  96 - 112 mEq/L Final  . CO2 12/26/2016 30  19 - 32 mEq/L Final  . Glucose, Bld 12/26/2016 140* 70 - 99 mg/dL Final  . BUN 12/26/2016 9  6 - 23 mg/dL Final  . Creatinine, Ser 12/26/2016 0.82  0.40 - 1.50 mg/dL Final  . Calcium 12/26/2016 9.5  8.4 - 10.5 mg/dL Final  . GFR 12/26/2016 123.61  >60.00 mL/min Final  . Hgb A1c MFr Bld 12/26/2016 7.9* 4.6 - 6.5 % Final   Glycemic Control Guidelines for People with Diabetes:Non Diabetic:  <6%Goal of Therapy: <7%Additional Action Suggested:  >8%      Allergies as of 12/29/2016      Reactions   Benazepril Cough   Pravastatin Other (See Comments)   myositis   Glipizide Nausea Only      Medication List        Accurate as of 12/29/16 10:48 AM. Always use your most recent med list.          ACCU-CHEK FASTCLIX LANCETS Misc USE  TO CHECK BLOOD SUGAR THREE TIMES DAILY   ACCU-CHEK GUIDE test strip Generic drug:  glucose blood USE TO CHECK BLOOD SUGAR 3 TIMES DAILY.   albuterol 108 (90 Base) MCG/ACT inhaler Commonly known as:  PROVENTIL HFA;VENTOLIN HFA Inhale 2 puffs into the lungs every 6 (six) hours as needed for wheezing or shortness of breath.   aspirin EC 81 MG tablet Take 81 mg by mouth every morning.   B-D ULTRAFINE III SHORT PEN 31G X 8 MM Misc Generic drug:  Insulin Pen Needle use as directed five times a day   BYSTOLIC 10 MG tablet Generic drug:  nebivolol take 2 tablets by mouth daily   cetirizine 10 MG tablet Commonly known as:  ZYRTEC Take 10 mg by mouth daily.   emtricitabine-rilpivir-tenofovir DF 200-25-300 MG tablet Commonly  known as:  COMPLERA Take 1 tablet daily by mouth.   Eszopiclone 3 MG Tabs Take 1 tablet (3 mg total) at bedtime by mouth. Take immediately before bedtime   furosemide 20 MG tablet Commonly known as:  LASIX Take 1 tablet (20 mg total) by mouth daily.   gabapentin 300 MG capsule Commonly known as:  NEURONTIN take 1 capsule by mouth three times a day     Glycopyrrolate-Formoterol 9-4.8 MCG/ACT Aero Commonly known as:  BEVESPI AEROSPHERE Inhale 2 puffs into the lungs 2 (two) times daily.   Insulin Glargine 300 UNIT/ML Sopn Commonly known as:  TOUJEO MAX SOLOSTAR Inject 80 Units into the skin 2 (two) times daily. Take 80 units in the morning and 70 the evening   irbesartan 300 MG tablet Commonly known as:  AVAPRO take 1 tablet by mouth once daily   linaclotide 72 MCG capsule Commonly known as:  LINZESS Take 1 capsule (72 mcg total) by mouth daily before breakfast.   metFORMIN 500 MG 24 hr tablet Commonly known as:  GLUCOPHAGE-XR 1 tablet with food twice a day for the first week and then 2 tablets twice a day   MULTIPLE VITAMIN PO Take 1 tablet by mouth.   NOVOLOG FLEXPEN 100 UNIT/ML FlexPen Generic drug:  insulin aspart inject 20 to 25 units subcutaneously three times a day with meals   pioglitazone 30 MG tablet Commonly known as:  ACTOS take 1 tablet by mouth once daily   trazodone 300 MG tablet Commonly known as:  DESYREL Take 1 tablet (300 mg total) by mouth at bedtime.   triamcinolone cream 0.5 % Commonly known as:  KENALOG APPLY 1 APPLICATION TOPICALLY THREE TIME A DAY       Allergies:  Allergies  Allergen Reactions  . Benazepril Cough  . Pravastatin Other (See Comments)    myositis  . Glipizide Nausea Only    Past Medical History:  Diagnosis Date  . Anxiety   . Cataract    right   . Depression   . Diabetes mellitus without complication ([place])   . DM (diabetes mellitus screen)   . HIV disease ([place]) 09/12/1995  . HIV infection ([place])   . Hypercholesterolemia   . Hyperlipidemia   . Hypertension     Past Surgical History:  Procedure Laterality Date  . COLONOSCOPY WITH PROPOFOL  01/16/2012   Procedure: COLONOSCOPY WITH PROPOFOL;  Surgeon: Garlan Fair, MD;  Location: [facility];  Service: Endoscopy;  Laterality: N/A;  . EYE SURGERY  2005   catarct surgery  . optic lens surgery  2005     Family History  Problem Relation Age of Onset  . Arthritis Mother   . Diabetes Mother   . Heart disease Mother   . Stroke Mother   . Arthritis Father   . Diabetes Father   . Heart disease Father     Social History:  reports that he quit smoking about 3 months ago. His smoking use included cigarettes. He smoked 0.25 packs per day. he has never used smokeless tobacco. He reports that he does not drink alcohol or use drugs.  Review of Systems:   Hypertension:  currently on Bystolic  and on Avapro 1/2 tablet daily Chlorthalidone was stopped on his previous visit here because of  low blood pressure Only taking Lasix 3 times a week for edema Potassium and creatinine adequately controlled  Lab Results  Component Value Date   CREATININE 0.82 12/26/2016   BUN 9 12/26/2016   NA  141 12/26/2016   K 4.8 12/26/2016   CL 104 12/26/2016   CO2 30 12/26/2016     Lipids: LDL is surprisingly is still fairly good without pravastatin He previously had been taking this for quite some time; his PCP told him to stop it because of muscle cramps which are better    Lab Results  Component Value Date   CHOL 131 12/26/2016   HDL 45.10 12/26/2016   LDLCALC 71 12/26/2016   TRIG 75.0 12/26/2016   CHOLHDL 3 12/26/2016    He has symptoms of neuropathy with paresthesia in feet and legs, Treated with gabapentin 300 mg 3 times a day  Depression:  followed by PCP    LABS:  Appointment on 12/26/2016  Component Date Value Ref Range Status  . Cholesterol 12/26/2016 131  0 - 200 mg/dL Final   ATP III Classification       Desirable:  < 200 mg/dL               Borderline High:  200 - 239 mg/dL          High:  > = 240 mg/dL  . Triglycerides 12/26/2016 75.0  0.0 - 149.0 mg/dL Final   Normal:  <150 mg/dLBorderline High:  150 - 199 mg/dL  . HDL 12/26/2016 45.10  >39.00 mg/dL Final  . VLDL 12/26/2016 15.0  0.0 - 40.0 mg/dL Final  . LDL Cholesterol 12/26/2016 71  0 - 99 mg/dL Final  . Total CHOL/HDL  Ratio 12/26/2016 3   Final                  Men          Women1/2 Average Risk     3.4          3.3Average Risk          5.0          4.42X Average Risk          9.6          7.13X Average Risk          15.0          11.0                      . NonHDL 12/26/2016 85.75   Final   NOTE:  Non-HDL goal should be 30 mg/dL higher than patient's LDL goal (i.e. LDL goal of < 70 mg/dL, would have non-HDL goal of < 100 mg/dL)  . Sodium 12/26/2016 141  135 - 145 mEq/L Final  . Potassium 12/26/2016 4.8  3.5 - 5.1 mEq/L Final  . Chloride 12/26/2016 104  96 - 112 mEq/L Final  . CO2 12/26/2016 30  19 - 32 mEq/L Final  . Glucose, Bld 12/26/2016 140* 70 - 99 mg/dL Final  . BUN 12/26/2016 9  6 - 23 mg/dL Final  . Creatinine, Ser 12/26/2016 0.82  0.40 - 1.50 mg/dL Final  . Calcium 12/26/2016 9.5  8.4 - 10.5 mg/dL Final  . GFR 12/26/2016 123.61  >60.00 mL/min Final  . Hgb A1c MFr Bld 12/26/2016 7.9* 4.6 - 6.5 % Final   Glycemic Control Guidelines for People with Diabetes:Non Diabetic:  <6%Goal of Therapy: <7%Additional Action Suggested:  >8%      Examination:   BP 128/82   Pulse 63   Ht 6\' 1"  (1.854 m)   Wt 194 lb (88 kg)   SpO2 96%   BMI 25.60 kg/m   Body  mass index is 25.6 kg/m.   No lower leg edema present  ASSESSMENT/ PLAN:    Diabetes type 2:  See history of present illness for detailed discussion of his current management, blood sugar patterns and problems identified  His A1c is relatively better at 7.9 However he still has an average blood sugar 178 at home and frequently over 200 after lunch/before suppertime and sporadically at other times also  He has had increased his Lantus doses as directed However continuing to benefit somewhat from using metformin and Actos His portions are usually small and his high sugars are mostly related to insulin resistance  Recommendations: Increase MORNING Lantus to 80 units Discussed how to adjust the dose every 3-4 days based on his sugar before  suppertime which should be generally between 101 150 He will switch to the [employer] for more convenience and economy Discussed in detail how this is different from Lantus and the new pen was demonstrated to him. Also reminded him that he does need to watch his blood sugars at suppertime and increase the Toujeo or Lantus further by 5 units if there are consistently over 150  He will continue the same doses of NovoLog for now, no need to increase it since his appetite is relatively low and did not have enough postprandial readings to get a pattern Increase mealtime dose by 5 units when blood sugars are over 200 Increase METFORMIN up to 1500 mg now with adding another tablet in the morning He will need to continue Actos which his helping his insulin resistance  EDEMA: This is mild and controlled well with Lasix 3 times a week, he will continue this  Hypertension  blood pressure is controlled He will continue to take only half tablet of the Avapro  LIPIDS: Excellent control currently without any statin drugs, may be improved with his decreased appetite but will need to continue monitoring periodically and also again consider a statin drugs other than pravastatin for cardiovascular risk reduction  Patient Instructions  lantus 80 in am and adjust to keep supper sugar between 100-150  Take 2 Metformin in am and in pm   Counseling time on subjects discussed in assessment and plan sections is over 50% of today's 25 minute visit     Elayne Snare 12/29/2016, 10:48 AM         ..

## 2016-12-31 ENCOUNTER — Other Ambulatory Visit: Payer: Self-pay | Admitting: Endocrinology

## 2017-01-01 LAB — HIV-1 RNA QUANT-NO REFLEX-BLD
HIV 1 RNA QUANT: NOT DETECTED {copies}/mL
HIV-1 RNA QUANT, LOG: NOT DETECTED {Log_copies}/mL

## 2017-01-03 ENCOUNTER — Ambulatory Visit: Payer: Self-pay | Admitting: *Deleted

## 2017-01-03 ENCOUNTER — Other Ambulatory Visit: Payer: Self-pay | Admitting: *Deleted

## 2017-01-05 ENCOUNTER — Other Ambulatory Visit: Payer: Self-pay | Admitting: *Deleted

## 2017-01-05 ENCOUNTER — Ambulatory Visit: Payer: Self-pay | Admitting: *Deleted

## 2017-01-08 ENCOUNTER — Other Ambulatory Visit: Payer: Self-pay | Admitting: *Deleted

## 2017-01-08 NOTE — Patient Outreach (Signed)
[facility]) Care Management  [facility] Social Work  01/08/2017  Nicholas Whitehead 08-Jan-1956 [id number]  Subjective:  "I am feeling motivated to get into counseling"  Objective: [facility] CSW to assist patient and family with community based resources to aide in their well-being, quality of life and overall safety and needs.    Current Medications:  Current Outpatient Medications  Medication Sig Dispense Refill  . ACCU-CHEK FASTCLIX LANCETS MISC USE  TO CHECK BLOOD SUGAR THREE TIMES DAILY 306 each 1  . ACCU-CHEK GUIDE test strip USE TO CHECK BLOOD SUGAR 3 TIMES DAILY. 200 each 2  . albuterol (PROVENTIL HFA;VENTOLIN HFA) 108 (90 Base) MCG/ACT inhaler Inhale 2 puffs into the lungs every 6 (six) hours as needed for wheezing or shortness of breath. 1 Inhaler 5  . aspirin EC 81 MG tablet Take 81 mg by mouth every morning.    . B-D ULTRAFINE III SHORT PEN 31G X 8 MM MISC use as directed five times a day 300 each 3  . BYSTOLIC 10 MG tablet take 2 tablets by mouth daily 60 tablet 11  . cetirizine (ZYRTEC) 10 MG tablet Take 10 mg by mouth daily.    Marland Kitchen emtricitabine-rilpivir-tenofovir DF (COMPLERA) 200-25-300 MG tablet Take 1 tablet daily by mouth. 30 tablet 3  . Eszopiclone 3 MG TABS Take 1 tablet (3 mg total) at bedtime by mouth. Take immediately before bedtime 30 tablet 5  . furosemide (LASIX) 20 MG tablet Take 1 tablet (20 mg total) by mouth daily. 30 tablet 3  . gabapentin (NEURONTIN) 300 MG capsule take 1 capsule by mouth three times a day 90 capsule 3  . Glycopyrrolate-Formoterol (BEVESPI AEROSPHERE) 9-4.8 MCG/ACT AERO Inhale 2 puffs into the lungs 2 (two) times daily. 10.7 g 11  . insulin aspart (NOVOLOG FLEXPEN) 100 UNIT/ML FlexPen 30 units before each meal 30 mL 1  . Insulin Glargine (TOUJEO MAX SOLOSTAR) 300 UNIT/ML SOPN Inject 80 Units into the skin 2 (two) times daily. Take 80 units in the morning and 70 the evening 6 pen 3  . irbesartan (AVAPRO) 300 MG tablet take 1 tablet by  mouth once daily (Patient taking differently: Take 1/2 tablet daily) 90 tablet 3  . linaclotide (LINZESS) 72 MCG capsule Take 1 capsule (72 mcg total) by mouth daily before breakfast. 90 capsule 1  . metFORMIN (GLUCOPHAGE-XR) 500 MG 24 hr tablet 1 tablet with food twice a day for the first week and then 2 tablets twice a day 120 tablet 3  . MULTIPLE VITAMIN PO Take 1 tablet by mouth.    . pioglitazone (ACTOS) 30 MG tablet take 1 tablet by mouth once daily 30 tablet 0  . trazodone (DESYREL) 300 MG tablet Take 1 tablet (300 mg total) by mouth at bedtime. (Patient not taking: Reported on 12/29/2016) 90 tablet 3  . triamcinolone cream (KENALOG) 0.5 % APPLY 1 APPLICATION TOPICALLY THREE TIME A DAY 100 g 1   No current facility-administered medications for this visit.     Functional Status:  In your present state of health, do you have any difficulty performing the following activities: 12/20/2016 10/05/2016  Hearing? N N  Vision? Y Y  Comment - per patient his eyes get tired and it is hard to focus  Difficulty concentrating or making decisions? Tempie Donning  Walking or climbing stairs? N N  Dressing or bathing? N N  Doing errands, shopping? N N  Preparing Food and eating ? N N  Using the Toilet? N N  In  the past six months, have you accidently leaked urine? N N  Do you have problems with loss of bowel control? N N  Managing your Medications? Y Y  Managing your Finances? N N  Housekeeping or managing your Housekeeping? N N  Some recent data might be hidden    Fall/Depression Screening:  Fall Risk  12/28/2016 12/20/2016 11/20/2016  Falls in the past year? No No No  Comment - - -  Number falls in past yr: - - -  Injury with Fall? - - -  Risk Factor Category  - - -  Risk for fall due to : - - -  Follow up - - -   PHQ 2/9 Scores 12/28/2016 12/20/2016 11/20/2016 10/13/2016 10/07/2016 10/05/2016 08/30/2016  PHQ - 2 Score 0 1 1 1 3 2 2   PHQ- 9 Score - 3 3 3 13 10  -    Assessment: CSW spoke with  patient by phone who reports he is feeling much better and is ready to talk to someone. He acknowledges, "your calls and perseverance have kept me pushing forward and I have not done my part".  CSW shared with patient the growth seen in his awareness and actions to pursue counseling.  He has talked with his "old therapist" who is looking into insurance and skype sessions.  "I also am considering going [facility].  CSW discussed plans to follow up in 20-30 days for update on this goal.  CSW has also discussed case closure once this goal is met unless further goals are identified.  Plan:  [facility] CM Care Plan Problem One     Most Recent Value  Care Plan Problem One  Patient reports depression.  Role Documenting the Problem One  Clinical Social Worker  Care Plan for Problem One  Active  [facility] Long Term Goal    Patient will report less signs/symptoms of depression in the next 90 days. [Patient will report less signs/symptoms]  [facility] Long Term Goal Start Date  10/06/16  [facility] Long Term Goal Met Date  01/08/17  Interventions for Problem One Long Term Goal  CSW educating and linking patient with services to assist with depression .   [facility] CM Short Term Goal #1   Patient will report daily intake of his anti-depressant over the next 30 days.  [facility] CM Short Term Goal #1 Start Date  10/06/16  Interventions for Short Term Goal #1  CSW discussed importantce of taking antidepressant as prescribed and encouraged daily intake.   [facility] CM Short Term Goal #2   Patient will report receipt of resources for counseling and will secure an appointment in the next 30 days.  [facility] CM Short Term Goal #2 Start Date  10/06/16  [facility] CM Short Term Goal #2 Met Date  01/08/17  Interventions for Short Term Goal #2  CSW will provide and encourage patient to schedule and participate in therapy for support related to his depression.  [facility] CM Short Term Goal #3  Patient will report appointment secured with mental health  professional in the nxt 30 days.   [facility] CM Short Term Goal #3 Start Date  01/08/17  Interventions for Short Tern Goal #3  CSW discussed and encouraged patient to decide and make appointment with his prefered provider.       Eduard Clos, MSW, [place] Worker  [place] [phone]

## 2017-01-22 ENCOUNTER — Other Ambulatory Visit: Payer: Self-pay | Admitting: *Deleted

## 2017-01-22 ENCOUNTER — Telehealth: Payer: Self-pay | Admitting: Internal Medicine

## 2017-01-22 NOTE — Telephone Encounter (Signed)
Copied from [place] [phone]. Topic: Quick Communication - See Telephone Encounter >> Jan 22, 2017  1:45 PM Robina Ade, Helene Kelp D wrote: Patient called and would like to know the status for his diabetic shoes. Patient said that they have faxed over the order and someone came in into the office last week. Please call patient back, thanks. CRM for notification. See Telephone encounter for: 01/22/17.

## 2017-01-23 ENCOUNTER — Other Ambulatory Visit: Payer: Self-pay

## 2017-01-23 DIAGNOSIS — I10 Essential (primary) hypertension: Secondary | ICD-10-CM

## 2017-01-23 MED ORDER — NEBIVOLOL HCL 10 MG PO TABS: 20.0000 mg | ORAL_TABLET | Freq: Every day | ORAL | 11 refills | Status: DC

## 2017-01-23 NOTE — Telephone Encounter (Signed)
Copied from [place] [id number]. Topic: Quick Communication - See Telephone Encounter >> Jan 22, 2017  1:45 PM Robina Ade, Helene Kelp D wrote: Patient called and would like to know the status for his diabetic shoes. Patient said that they have faxed over the order and someone came in into the office last week. Please call patient back, thanks. CRM for notification. See Telephone encounter for: 01/22/17.  >> Jan 23, 2017  1:33 PM Sandi Mariscal E, NT wrote: Patient called back and said that the company ordered his diabetic shoes and the shoes that was ordered are not the correct insert inside the shoes that was requested. Pt said that since office had not contacted them within in the time frame to to get the correct inserts the company went on to make what was covered by insurance. Pt would like a call back regarding how to get the correct inserts for his shoes. [phone]

## 2017-01-23 NOTE — Telephone Encounter (Signed)
Pt informed

## 2017-01-23 NOTE — Patient Outreach (Signed)
[facility]) Care Management  01/23/2017   Nicholas Whitehead 1955-10-16 [id number]  Subjective: RN Health Coach telephone call to patient.  Hipaa compliance verified. Patient fasting blood sugar was 105. Patient A1C is 7.9. Patient has ordered diabetic shoes and is really anxious about them not fitting. Per patient he is not entitled to another pair of shoes until next year. Patient is not opening his blinds and  has not been out of his house in 3 weeks except for appointment.Per patient he just hasn't felt like going out.Patient and RN discussed him going into therapy.Patient agreed that he needs to talk with someone.  Patient is not wanting to harm himself.  After talking  And allowing patient to express his feelings. Patient reported feeling better and the two goal set was something that he felt he could achieve. Patient is going to walk over to the store .  Current Medications:  Current Outpatient Medications  Medication Sig Dispense Refill  . ACCU-CHEK FASTCLIX LANCETS MISC USE  TO CHECK BLOOD SUGAR THREE TIMES DAILY 306 each 1  . ACCU-CHEK GUIDE test strip USE TO CHECK BLOOD SUGAR 3 TIMES DAILY. 200 each 2  . albuterol (PROVENTIL HFA;VENTOLIN HFA) 108 (90 Base) MCG/ACT inhaler Inhale 2 puffs into the lungs every 6 (six) hours as needed for wheezing or shortness of breath. 1 Inhaler 5  . aspirin EC 81 MG tablet Take 81 mg by mouth every morning.    . B-D ULTRAFINE III SHORT PEN 31G X 8 MM MISC use as directed five times a day 300 each 3  . cetirizine (ZYRTEC) 10 MG tablet Take 10 mg by mouth daily.    Marland Kitchen emtricitabine-rilpivir-tenofovir DF (COMPLERA) 200-25-300 MG tablet Take 1 tablet daily by mouth. 30 tablet 3  . Eszopiclone 3 MG TABS Take 1 tablet (3 mg total) at bedtime by mouth. Take immediately before bedtime 30 tablet 5  . furosemide (LASIX) 20 MG tablet Take 1 tablet (20 mg total) by mouth daily. 30 tablet 3  . gabapentin (NEURONTIN) 300 MG capsule take 1 capsule by  mouth three times a day 90 capsule 3  . Glycopyrrolate-Formoterol (BEVESPI AEROSPHERE) 9-4.8 MCG/ACT AERO Inhale 2 puffs into the lungs 2 (two) times daily. 10.7 g 11  . insulin aspart (NOVOLOG FLEXPEN) 100 UNIT/ML FlexPen 30 units before each meal 30 mL 1  . Insulin Glargine (TOUJEO MAX SOLOSTAR) 300 UNIT/ML SOPN Inject 80 Units into the skin 2 (two) times daily. Take 80 units in the morning and 70 the evening 6 pen 3  . irbesartan (AVAPRO) 300 MG tablet take 1 tablet by mouth once daily (Patient taking differently: Take 1/2 tablet daily) 90 tablet 3  . linaclotide (LINZESS) 72 MCG capsule Take 1 capsule (72 mcg total) by mouth daily before breakfast. 90 capsule 1  . metFORMIN (GLUCOPHAGE-XR) 500 MG 24 hr tablet 1 tablet with food twice a day for the first week and then 2 tablets twice a day 120 tablet 3  . MULTIPLE VITAMIN PO Take 1 tablet by mouth.    . nebivolol (BYSTOLIC) 10 MG tablet Take 2 tablets (20 mg total) by mouth daily. 60 tablet 11  . pioglitazone (ACTOS) 30 MG tablet take 1 tablet by mouth once daily 30 tablet 0  . trazodone (DESYREL) 300 MG tablet Take 1 tablet (300 mg total) by mouth at bedtime. (Patient not taking: Reported on 12/29/2016) 90 tablet 3  . triamcinolone cream (KENALOG) 0.5 % APPLY 1 APPLICATION TOPICALLY THREE TIME A DAY  100 g 1   No current facility-administered medications for this visit.     Functional Status:  In your present state of health, do you have any difficulty performing the following activities: 01/23/2017 12/20/2016  Hearing? N N  Vision? Y Y  Comment - -  Difficulty concentrating or making decisions? Tempie Donning  Walking or climbing stairs? N N  Dressing or bathing? N N  Doing errands, shopping? N N  Preparing Food and eating ? N N  Using the Toilet? N N  In the past six months, have you accidently leaked urine? N N  Do you have problems with loss of bowel control? N N  Managing your Medications? Y Y  Managing your Finances? N N  Housekeeping or  managing your Housekeeping? N N  Some recent data might be hidden    Fall/Depression Screening: Fall Risk  01/23/2017 12/28/2016 12/20/2016  Falls in the past year? No No No  Comment - - -  Number falls in past yr: - - -  Injury with Fall? - - -  Risk Factor Category  - - -  Risk for fall due to : - - -  Follow up - - -   PHQ 2/9 Scores 01/23/2017 12/28/2016 12/20/2016 11/20/2016 10/13/2016 10/07/2016 10/05/2016  PHQ - 2 Score 1 0 _0 PHQ- 9 Score 3 - _1 THN CM Care Plan Problem One     Most Recent Value  Care Plan Problem One  Knowledge Deficit in self management of Diabetes  Role Documenting the Problem One  [place] for Problem One  Active  [facility]N Long Term Goal   Patient will see A1C decrease with next blood draw within the next 90 days  Interventions for Problem One Long Term Goal  RN discussed fasting blood sugar levels . Patient received educational material on living well with diabetes . RN follows up with discussion and teach back  THN CM Short Term Goal #1   Patient will report starting an exercise routine withn the next 30 days  [facility]N CM Short Term Goal #1 Start Date  01/23/17  Interventions for Short Term Goal #1  RN discussed the importance of exercising. RN and patient set a goal for patient to start an exercising program before next out reach call. RN will follow up  [facility] CM Short Term Goal #2   Patient will report calling to make an appointment for counseling within the next 30 days  [facility]N CM Short Term Goal #2 Start Date  01/23/17  Interventions for Short Term Goal #2  RN discussed with the patient aboutgoing to therapy. RN set with patient a goal that he will contact infection control Dr and arrange to go into therapy. RN will follow up with patient for further dicscussion  [facility] CM Short Term Goal #4  Patient reported receiving information on diabetic shoes within the next 30 days  [facility]N CM Short Term Goal #4 Met Date  01/23/17  Interventions for  Short Term Goal #4  RN discussed with patient about getting diabetic shoes that fit. RN will follow up with patient on receiving information      Assessment:  Patient A1C 7.9 Fasting blood sugar 105 Patient will continue to  benefit from [facility] telephonic outreach for education and support for diabetes self management. Diabetic shoes have been ordered Plan:  Patient will contact infection control therapist for appointment Patient will start exercise  program before next outreach call Patient will continue to try to eat healthy Patient will received Diabetic shoes RN set a date with patient that a follow up call will be February 15 to see if he achieved the goals set.  [place] Care Management [phone]

## 2017-01-23 NOTE — Telephone Encounter (Signed)
Will you inform pt that it was faxed yesterday.

## 2017-01-24 ENCOUNTER — Ambulatory Visit: Payer: Medicare HMO | Admitting: Internal Medicine

## 2017-02-02 ENCOUNTER — Ambulatory Visit: Payer: Self-pay | Admitting: *Deleted

## 2017-02-02 ENCOUNTER — Other Ambulatory Visit: Payer: Self-pay | Admitting: *Deleted

## 2017-02-08 ENCOUNTER — Other Ambulatory Visit: Payer: Self-pay | Admitting: *Deleted

## 2017-02-08 ENCOUNTER — Ambulatory Visit: Payer: Self-pay | Admitting: *Deleted

## 2017-02-20 ENCOUNTER — Other Ambulatory Visit: Payer: Self-pay | Admitting: *Deleted

## 2017-02-20 ENCOUNTER — Encounter: Payer: Self-pay | Admitting: *Deleted

## 2017-02-20 NOTE — Patient Outreach (Signed)
[facility]) Care Management  02/20/2017  Nicholas Whitehead May 27, 1955 [id number]   CSW has attempted to reach patient by phone without success or return call.  CSW will send patient an outreach letter by mail and attempt contact again next week.   Eduard Clos, MSW, [place] Worker  [place] [phone]

## 2017-02-21 ENCOUNTER — Ambulatory Visit: Payer: Medicare HMO | Admitting: Internal Medicine

## 2017-02-23 ENCOUNTER — Other Ambulatory Visit: Payer: Self-pay | Admitting: *Deleted

## 2017-02-23 NOTE — Patient Outreach (Signed)
[facility]) Care Management  02/23/2017  Nicholas Whitehead 12/25/1955 [id number]   RN Health Coach attempted#1 follow up outreach call to patient.  Patient was unavailable. HIPPA compliance voicemail message left with return callback number.  Plan: RN will call patient again within 10 business days.  [place] Care Management [phone]

## 2017-02-26 ENCOUNTER — Other Ambulatory Visit: Payer: Self-pay | Admitting: *Deleted

## 2017-02-26 NOTE — Patient Outreach (Signed)
[facility]) Care Management  02/26/2017  Nicholas Whitehead 27-Nov-1955 [id number]  RN Health Coach attempted #2 follow up outreach call to patient.  Patient was unavailable. HIPPA compliance voicemail message left with return callback number.  Plan: RN sent out unsuccessful outreach letter RN will call patient again within 10 business days.  [place] Care Management [phone]

## 2017-03-01 ENCOUNTER — Other Ambulatory Visit: Payer: Medicare HMO

## 2017-03-02 ENCOUNTER — Other Ambulatory Visit: Payer: Self-pay | Admitting: *Deleted

## 2017-03-02 NOTE — Patient Outreach (Signed)
[place] [facility]) Care Management  [facility] Social Work  03/02/2017  Nicholas Whitehead 1955/11/22 [id number]  Subjective:  Unable to reach patient.   Objective: [facility] CSW to assist patient and family with community based resources to aide in their well-being, quality of life and overall safety and needs.    Encounter Medications:  Outpatient Encounter Medications as of 03/02/2017  Medication Sig Note  . ACCU-CHEK FASTCLIX LANCETS MISC USE  TO CHECK BLOOD SUGAR THREE TIMES DAILY   . ACCU-CHEK GUIDE test strip USE TO CHECK BLOOD SUGAR 3 TIMES DAILY.   Marland Kitchen albuterol (PROVENTIL HFA;VENTOLIN HFA) 108 (90 Base) MCG/ACT inhaler Inhale 2 puffs into the lungs every 6 (six) hours as needed for wheezing or shortness of breath.   Marland Kitchen aspirin EC 81 MG tablet Take 81 mg by mouth every morning.   . B-D ULTRAFINE III SHORT PEN 31G X 8 MM MISC use as directed five times a day   . cetirizine (ZYRTEC) 10 MG tablet Take 10 mg by mouth daily.   Marland Kitchen emtricitabine-rilpivir-tenofovir DF (COMPLERA) 200-25-300 MG tablet Take 1 tablet daily by mouth.   . Eszopiclone 3 MG TABS Take 1 tablet (3 mg total) at bedtime by mouth. Take immediately before bedtime   . furosemide (LASIX) 20 MG tablet Take 1 tablet (20 mg total) by mouth daily.   Marland Kitchen gabapentin (NEURONTIN) 300 MG capsule take 1 capsule by mouth three times a day   . Glycopyrrolate-Formoterol (BEVESPI AEROSPHERE) 9-4.8 MCG/ACT AERO Inhale 2 puffs into the lungs 2 (two) times daily.   . insulin aspart (NOVOLOG FLEXPEN) 100 UNIT/ML FlexPen 30 units before each meal   . Insulin Glargine (TOUJEO MAX SOLOSTAR) 300 UNIT/ML SOPN Inject 80 Units into the skin 2 (two) times daily. Take 80 units in the morning and 70 the evening   . irbesartan (AVAPRO) 300 MG tablet take 1 tablet by mouth once daily (Patient taking differently: Take 1/2 tablet daily) 11/01/2016: Patient said Dr. Dwyane Dee decreased the dose to 1/2tablet  . linaclotide (LINZESS) 72 MCG capsule Take 1 capsule (72  mcg total) by mouth daily before breakfast.   . metFORMIN (GLUCOPHAGE-XR) 500 MG 24 hr tablet 1 tablet with food twice a day for the first week and then 2 tablets twice a day   . MULTIPLE VITAMIN PO Take 1 tablet by mouth.   . nebivolol (BYSTOLIC) 10 MG tablet Take 2 tablets (20 mg total) by mouth daily.   . pioglitazone (ACTOS) 30 MG tablet take 1 tablet by mouth once daily   . trazodone (DESYREL) 300 MG tablet Take 1 tablet (300 mg total) by mouth at bedtime. (Patient not taking: Reported on 12/29/2016)   . triamcinolone cream (KENALOG) 0.5 % APPLY 1 APPLICATION TOPICALLY THREE TIME A DAY    No facility-administered encounter medications on file as of 03/02/2017.     Functional Status:  In your present state of health, do you have any difficulty performing the following activities: 01/23/2017 12/20/2016  Hearing? N N  Vision? Y Y  Comment - -  Difficulty concentrating or making decisions? Tempie Donning  Walking or climbing stairs? N N  Dressing or bathing? N N  Doing errands, shopping? N N  Preparing Food and eating ? N N  Using the Toilet? N N  In the past six months, have you accidently leaked urine? N N  Do you have problems with loss of bowel control? N N  Managing your Medications? Y Y  Managing your Finances? N N  Housekeeping or managing your Housekeeping? N N  Some recent data might be hidden    Fall/Depression Screening:  PHQ 2/9 Scores 01/23/2017 12/28/2016 12/20/2016 11/20/2016 10/13/2016 10/07/2016 10/05/2016  PHQ - 2 Score 1 0 _0 PHQ- 9 Score 3 - _1 Assessment:  CSW has been unable to reach patient by phone and have sent outreach letter without response.  CSW will plan case closure at this time. CSW will send discipline closure letter and advise [facility] team, PCP and patient.   [facility] CM Care Plan Problem One     Most Recent Value  Care Plan Problem One  Patient reports depression.  Role Documenting the Problem One  Clinical Social Worker  Care Plan for Problem  One  Active  [facility] Long Term Goal    Patient will report less signs/symptoms of depression in the next 90 days. [Patient will report less signs/symptoms]  [facility]N Long Term Goal Start Date  10/06/16  [facility] Long Term Goal Met Date  01/08/17  Interventions for Problem One Long Term Goal  CSW educating and linking patient with services to assist with depression .   THN CM Short Term Goal #1   Patient will report daily intake of his anti-depressant over the next 30 days.  THN CM Short Term Goal #1 Start Date  10/06/16  [facility] CM Short Term Goal #1 Met Date  03/02/17  Interventions for Short Term Goal #1  CSW discussed importantce of taking antidepressant as prescribed and encouraged daily intake.   THN CM Short Term Goal #2   Patient will report receipt of resources for counseling and will secure an appointment in the next 30 days.  THN CM Short Term Goal #2 Start Date  10/06/16  [facility] CM Short Term Goal #2 Met Date  01/08/17  Interventions for Short Term Goal #2  CSW will provide and encourage patient to schedule and participate in therapy for support related to his depression.  THN CM Short Term Goal #3  Patient will report appointment secured with mental health professional in the nxt 30 days.   THN CM Short Term Goal #3 Start Date  01/08/17  [facility] CM Short Term Goal #3 Met Date  03/02/17  Interventions for Short Tern Goal #3  CSW discussed and encouraged patient to decide and make appointment with his prefered provider.      Eduard Clos, MSW, [place] Worker  [place] [phone]  Plan:

## 2017-03-03 ENCOUNTER — Other Ambulatory Visit: Payer: Self-pay | Admitting: Endocrinology

## 2017-03-05 ENCOUNTER — Ambulatory Visit: Payer: Medicare HMO | Admitting: Endocrinology

## 2017-03-05 DIAGNOSIS — Z0289 Encounter for other administrative examinations: Secondary | ICD-10-CM

## 2017-03-06 ENCOUNTER — Other Ambulatory Visit: Payer: Self-pay | Admitting: Endocrinology

## 2017-03-12 ENCOUNTER — Other Ambulatory Visit: Payer: Self-pay | Admitting: *Deleted

## 2017-03-12 NOTE — Patient Outreach (Signed)
[facility]) Care Management  03/12/2017  Nicholas Whitehead Feb 10, 1955 [id number]  RN Health Coach attempted #[phone] up outreach call to patient.  Patient was unavailable. HIPPA compliance voicemail message left with return callback number.  Plan: RN will send case Closure letter to patient and physician RN will make CMA aware of case closure  [place] Management [phone]

## 2017-03-13 ENCOUNTER — Other Ambulatory Visit: Payer: Self-pay | Admitting: Internal Medicine

## 2017-03-13 DIAGNOSIS — F329 Major depressive disorder, single episode, unspecified: Secondary | ICD-10-CM

## 2017-03-13 DIAGNOSIS — F32A Depression, unspecified: Secondary | ICD-10-CM

## 2017-03-13 DIAGNOSIS — F419 Anxiety disorder, unspecified: Principal | ICD-10-CM

## 2017-03-13 MED ORDER — DULOXETINE HCL 60 MG PO CPEP: 60.0000 mg | ORAL_CAPSULE | Freq: Every day | ORAL | 1 refills | Status: DC

## 2017-03-15 ENCOUNTER — Other Ambulatory Visit: Payer: Self-pay | Admitting: Endocrinology

## 2017-04-03 ENCOUNTER — Other Ambulatory Visit: Payer: Self-pay | Admitting: Internal Medicine

## 2017-04-03 DIAGNOSIS — B20 Human immunodeficiency virus [HIV] disease: Secondary | ICD-10-CM

## 2017-04-04 ENCOUNTER — Encounter: Payer: Self-pay | Admitting: Internal Medicine

## 2017-04-04 ENCOUNTER — Other Ambulatory Visit (INDEPENDENT_AMBULATORY_CARE_PROVIDER_SITE_OTHER): Payer: Medicare HMO

## 2017-04-04 ENCOUNTER — Ambulatory Visit (INDEPENDENT_AMBULATORY_CARE_PROVIDER_SITE_OTHER): Payer: Medicare HMO | Admitting: Internal Medicine

## 2017-04-04 VITALS — BP 140/60 | HR 60 | Temp 98.7°F | Resp 16 | Ht 73.0 in | Wt 191.5 lb

## 2017-04-04 DIAGNOSIS — E559 Vitamin D deficiency, unspecified: Secondary | ICD-10-CM | POA: Diagnosis not present

## 2017-04-04 DIAGNOSIS — Z794 Long term (current) use of insulin: Secondary | ICD-10-CM

## 2017-04-04 DIAGNOSIS — E1165 Type 2 diabetes mellitus with hyperglycemia: Secondary | ICD-10-CM

## 2017-04-04 DIAGNOSIS — I10 Essential (primary) hypertension: Secondary | ICD-10-CM | POA: Diagnosis not present

## 2017-04-04 LAB — CBC WITH DIFFERENTIAL/PLATELET
Basophils Absolute: 0 10*3/uL (ref 0.0–0.1)
Basophils Relative: 0.2 % (ref 0.0–3.0)
EOS PCT: 1 % (ref 0.0–5.0)
Eosinophils Absolute: 0.1 10*3/uL (ref 0.0–0.7)
HCT: 42.5 % (ref 39.0–52.0)
Hemoglobin: 14.5 g/dL (ref 13.0–17.0)
LYMPHS ABS: 3.6 10*3/uL (ref 0.7–4.0)
Lymphocytes Relative: 34.1 % (ref 12.0–46.0)
MCHC: 34.1 g/dL (ref 30.0–36.0)
MCV: 96.8 fl (ref 78.0–100.0)
MONO ABS: 0.9 10*3/uL (ref 0.1–1.0)
Monocytes Relative: 8.4 % (ref 3.0–12.0)
NEUTROS PCT: 56.3 % (ref 43.0–77.0)
Neutro Abs: 6 10*3/uL (ref 1.4–7.7)
PLATELETS: 186 10*3/uL (ref 150.0–400.0)
RBC: 4.39 Mil/uL (ref 4.22–5.81)
RDW: 13.8 % (ref 11.5–15.5)
WBC: 10.6 10*3/uL — ABNORMAL HIGH (ref 4.0–10.5)

## 2017-04-04 LAB — BASIC METABOLIC PANEL
BUN: 6 mg/dL (ref 6–23)
CALCIUM: 9.4 mg/dL (ref 8.4–10.5)
CO2: 30 mEq/L (ref 19–32)
Chloride: 104 mEq/L (ref 96–112)
Creatinine, Ser: 0.78 mg/dL (ref 0.40–1.50)
GFR: 130.83 mL/min (ref >60.00)
Glucose, Bld: 70 mg/dL (ref 70–99)
Potassium: 3.7 mEq/L (ref 3.5–5.1)
SODIUM: 141 meq/L (ref 135–145)

## 2017-04-04 LAB — POCT GLYCOSYLATED HEMOGLOBIN (HGB A1C): HEMOGLOBIN A1C: 6.4

## 2017-04-04 LAB — VITAMIN D 25 HYDROXY (VIT D DEFICIENCY, FRACTURES): VITD: 22.19 ng/mL — AB (ref 30.00–100.00)

## 2017-04-04 MED ORDER — CHOLECALCIFEROL 50 MCG (2000 UT) PO TABS: 1.0000 | ORAL_TABLET | Freq: Every day | ORAL | 1 refills | Status: AC

## 2017-04-04 NOTE — Progress Notes (Signed)
Subjective:  Patient ID: Nicholas Whitehead, male    DOB: Apr 30, 1955  Age: 62 y.o. MRN: [id number]  CC: Diabetes and Hypertension   HPI Nicholas Whitehead presents for f/up - He has felt well recently with no chest pain or shortness of breath.  He tells me his blood pressure and blood sugars have been well controlled.  Outpatient Medications Prior to Visit  Medication Sig Dispense Refill  . ACCU-CHEK FASTCLIX LANCETS MISC USE  TO CHECK BLOOD SUGAR THREE TIMES DAILY 306 each 1  . ACCU-CHEK GUIDE test strip USE TO CHECK BLOOD SUGAR 3 TIMES DAILY. 200 each 2  . albuterol (PROVENTIL HFA;VENTOLIN HFA) 108 (90 Base) MCG/ACT inhaler Inhale 2 puffs into the lungs every 6 (six) hours as needed for wheezing or shortness of breath. 1 Inhaler 5  . aspirin EC 81 MG tablet Take 81 mg by mouth every morning.    . B-D ULTRAFINE III SHORT PEN 31G X 8 MM MISC use as directed five times a day 300 each 3  . cetirizine (ZYRTEC) 10 MG tablet Take 10 mg by mouth daily.    . COMPLERA 200-25-300 MG tablet TAKE 1 TABLET BY MOUTH ONCE DAILY 30 tablet 1  . DULoxetine (CYMBALTA) 60 MG capsule Take 1 capsule (60 mg total) by mouth daily. 90 capsule 1  . Eszopiclone 3 MG TABS Take 1 tablet (3 mg total) at bedtime by mouth. Take immediately before bedtime 30 tablet 5  . furosemide (LASIX) 20 MG tablet Take 1 tablet (20 mg total) by mouth daily. 30 tablet 3  . gabapentin (NEURONTIN) 300 MG capsule take 1 capsule by mouth three times a day 90 capsule 3  . Glycopyrrolate-Formoterol (BEVESPI AEROSPHERE) 9-4.8 MCG/ACT AERO Inhale 2 puffs into the lungs 2 (two) times daily. 10.7 g 11  . Insulin Glargine (TOUJEO MAX SOLOSTAR) 300 UNIT/ML SOPN Inject 80 Units into the skin 2 (two) times daily. Take 80 units in the morning and 70 the evening 6 pen 3  . irbesartan (AVAPRO) 300 MG tablet take 1 tablet by mouth once daily (Patient taking differently: Take 1/2 tablet daily) 90 tablet 3  . linaclotide (LINZESS) 72 MCG capsule Take 1 capsule  (72 mcg total) by mouth daily before breakfast. 90 capsule 1  . metFORMIN (GLUCOPHAGE-XR) 500 MG 24 hr tablet 2 TABLETS BY MOUTH TWICE A DAY 360 tablet 1  . MULTIPLE VITAMIN PO Take 1 tablet by mouth.    . nebivolol (BYSTOLIC) 10 MG tablet Take 2 tablets (20 mg total) by mouth daily. 60 tablet 11  . NOVOLOG FLEXPEN 100 UNIT/ML FlexPen inject 30 units subcutaneously before meals 30 mL 1  . pioglitazone (ACTOS) 30 MG tablet take 1 tablet by mouth once daily 30 tablet 0  . trazodone (DESYREL) 300 MG tablet Take 1 tablet (300 mg total) by mouth at bedtime. 90 tablet 3  . triamcinolone cream (KENALOG) 0.5 % APPLY 1 APPLICATION TOPICALLY THREE TIME A DAY 100 g 1   No facility-administered medications prior to visit.     ROS Review of Systems  Constitutional: Negative.  Negative for appetite change, diaphoresis, fatigue and unexpected weight change.  HENT: Negative.   Eyes: Negative for visual disturbance.  Respiratory: Negative for cough, chest tightness, shortness of breath and wheezing.   Cardiovascular: Negative.  Negative for chest pain, palpitations and leg swelling.  Gastrointestinal: Negative for abdominal pain, constipation, diarrhea, nausea and vomiting.  Endocrine: Negative for polydipsia, polyphagia and polyuria.  Genitourinary: Negative.  Negative for decreased urine  volume, difficulty urinating, dysuria, hematuria and urgency.  Musculoskeletal: Negative.  Negative for arthralgias, back pain, myalgias and neck pain.  Skin: Negative.  Negative for color change, pallor and rash.  Neurological: Negative.  Negative for dizziness, weakness, light-headedness and headaches.  Hematological: Negative for adenopathy. Does not bruise/bleed easily.  Psychiatric/Behavioral: Negative.     Objective:  BP 140/60 (BP Location: Left Arm, Patient Position: Sitting, Cuff Size: Normal)   Pulse 60   Temp 98.7 F (37.1 C) (Oral)   Resp 16   Ht 6\' 1"  (1.854 m)   Wt 191 lb 8 oz (86.9 kg)   SpO2 94%    BMI 25.27 kg/m   BP Readings from Last 3 Encounters:  04/04/17 140/60  12/29/16 128/82  12/28/16 (!) 148/87    Wt Readings from Last 3 Encounters:  04/04/17 191 lb 8 oz (86.9 kg)  12/29/16 194 lb (88 kg)  12/28/16 191 lb (86.6 kg)    Physical Exam  Constitutional: He is oriented to person, place, and time. No distress.  HENT:  Mouth/Throat: Oropharynx is clear and moist. No oropharyngeal exudate.  Eyes: Conjunctivae are normal. Left eye exhibits no discharge. No scleral icterus.  Neck: Normal range of motion. Neck supple. No JVD present. No thyromegaly present.  Cardiovascular: Normal rate, regular rhythm and normal heart sounds. Exam reveals no gallop.  No murmur heard. Pulmonary/Chest: Effort normal and breath sounds normal. No respiratory distress. He has no wheezes. He has no rales.  Abdominal: Soft. Bowel sounds are normal. He exhibits no distension and no mass. There is no tenderness. There is no guarding.  Musculoskeletal: Normal range of motion. He exhibits no edema, tenderness or deformity.  Lymphadenopathy:    He has no cervical adenopathy.  Neurological: He is alert and oriented to person, place, and time.  Skin: Skin is warm and dry. No rash noted. He is not diaphoretic. No erythema. No pallor.  Vitals reviewed.   Lab Results  Component Value Date   WBC 10.6 (H) 04/04/2017   HGB 14.5 04/04/2017   HCT 42.5 04/04/2017   PLT 186.0 04/04/2017   GLUCOSE 70 04/04/2017   CHOL 131 12/26/2016   TRIG 75.0 12/26/2016   HDL 45.10 12/26/2016   LDLCALC 71 12/26/2016   ALT 26 10/24/2016   AST 31 10/24/2016   NA 141 04/04/2017   K 3.7 04/04/2017   CL 104 04/04/2017   CREATININE 0.78 04/04/2017   BUN 6 04/04/2017   CO2 30 04/04/2017   TSH 1.04 10/24/2016   PSA 0.82 03/25/2015   HGBA1C 6.4 04/04/2017   MICROALBUR 0.8 10/24/2016    Dg Abdomen 1 View  Result Date: 09/11/2016 CLINICAL DATA:  Abdominal pain.  Constipation. EXAM: ABDOMEN - 1 VIEW COMPARISON:  None.  FINDINGS: No dilated small bowel loops. Mild gas and stool in the large bowel. No evidence of pneumatosis or pneumoperitoneum. No radiopaque urolithiasis. Mild lumbar spondylosis. IMPRESSION: Nonobstructive bowel gas pattern.  Mild colonic stool burden. Electronically Signed   By: Ilona Sorrel M.D.   On: 09/11/2016 14:28    Assessment & Plan:   Aydenn was seen today for diabetes and hypertension.  Diagnoses and all orders for this visit:  Uncontrolled type 2 diabetes mellitus with hyperglycemia, with long-term current use of insulin ([place])- His A1c is at 6.4%.  His blood sugars are adequately well controlled.  Will continue Actos, metformin, NovoLog, and Toujeo. -     Ambulatory referral to Ophthalmology -     POCT glycosylated hemoglobin (Hb  A1C) -     Basic metabolic panel; Future  Essential hypertension- His blood pressure is not quite adequately well controlled.  I will treat the vitamin D deficiency.  His electrolytes and renal function are normal. -     Basic metabolic panel; Future -     CBC with Differential/Platelet; Future -     VITAMIN D 25 Hydroxy (Vit-D Deficiency, Fractures); Future  Vitamin D deficiency -     Cholecalciferol 2000 units TABS; Take 1 tablet (2,000 Units total) by mouth daily.   I am having Nicholas Whitehead start on Cholecalciferol. I am also having him maintain his cetirizine, aspirin EC, ACCU-CHEK FASTCLIX LANCETS, triamcinolone cream, trazodone, Glycopyrrolate-Formoterol, irbesartan, B-D ULTRAFINE III SHORT PEN, MULTIPLE VITAMIN PO, albuterol, linaclotide, furosemide, gabapentin, Eszopiclone, ACCU-CHEK GUIDE, Insulin Glargine, nebivolol, metFORMIN, NOVOLOG FLEXPEN, DULoxetine, pioglitazone, and COMPLERA.  Meds ordered this encounter  Medications  . Cholecalciferol 2000 units TABS    Sig: Take 1 tablet (2,000 Units total) by mouth daily.    Dispense:  90 tablet    Refill:  1     Follow-up: Return in about 6 months (around 10/05/2017).  Scarlette Calico, MD

## 2017-04-04 NOTE — Patient Instructions (Signed)

## 2017-04-06 ENCOUNTER — Telehealth: Payer: Self-pay

## 2017-04-06 ENCOUNTER — Telehealth: Payer: Self-pay | Admitting: Internal Medicine

## 2017-04-06 NOTE — Telephone Encounter (Signed)
Pt states that insurance will not cover the Lunesta and Irbesartan. Are there alternatives he can take?

## 2017-04-06 NOTE — Telephone Encounter (Signed)
Copied from [place] [phone]. Topic: Inquiry >> Apr 06, 2017 11:54 AM Oliver Pila B wrote: Reason for CRM: [employer] called about a form that needs to be filled out, signed and dated by Dr. [name] for diabetic shoes, contact [employer] if needed @ [phone] ask for Wille Glaser Fax: [phone]

## 2017-04-08 ENCOUNTER — Other Ambulatory Visit: Payer: Self-pay | Admitting: Internal Medicine

## 2017-04-08 DIAGNOSIS — E1140 Type 2 diabetes mellitus with diabetic neuropathy, unspecified: Secondary | ICD-10-CM

## 2017-04-08 DIAGNOSIS — Z794 Long term (current) use of insulin: Secondary | ICD-10-CM

## 2017-04-08 DIAGNOSIS — I10 Essential (primary) hypertension: Secondary | ICD-10-CM

## 2017-04-08 MED ORDER — LOSARTAN POTASSIUM 100 MG PO TABS: 100.0000 mg | ORAL_TABLET | Freq: Every day | ORAL | 1 refills | Status: DC

## 2017-04-11 ENCOUNTER — Telehealth: Payer: Self-pay | Admitting: Internal Medicine

## 2017-04-11 NOTE — Telephone Encounter (Signed)
Copied from [place] [phone]. Topic: Quick Communication - Office Called Patient >> Apr 11, 2017 10:01 AM Darl Householder, RMA wrote: Reason for CRM: patient states he received a call from office, please return pt call. No CRM or Phone note documented

## 2017-04-11 NOTE — Telephone Encounter (Signed)
Have you called patient for any reason, I do not see any new messages.

## 2017-04-18 NOTE — Telephone Encounter (Signed)
Informed pt that rx for trazadone was sent in.

## 2017-04-24 ENCOUNTER — Other Ambulatory Visit: Payer: Medicare HMO

## 2017-04-26 ENCOUNTER — Other Ambulatory Visit: Payer: Self-pay | Admitting: Endocrinology

## 2017-04-30 ENCOUNTER — Other Ambulatory Visit: Payer: Self-pay | Admitting: Endocrinology

## 2017-05-02 ENCOUNTER — Telehealth: Payer: Self-pay | Admitting: Endocrinology

## 2017-05-02 ENCOUNTER — Other Ambulatory Visit: Payer: Self-pay

## 2017-05-02 MED ORDER — PIOGLITAZONE HCL 30 MG PO TABS: ORAL_TABLET | ORAL | 0 refills | Status: DC

## 2017-05-02 NOTE — Telephone Encounter (Signed)
Rx sent to pharmacy   

## 2017-05-02 NOTE — Telephone Encounter (Signed)
Patient needs RX for Proglitazone HDL 30 mg sent to [facility] on [address]. Asap. Patient out of medication.

## 2017-05-06 ENCOUNTER — Other Ambulatory Visit: Payer: Self-pay | Admitting: Internal Medicine

## 2017-05-08 ENCOUNTER — Ambulatory Visit: Payer: Medicare HMO | Admitting: Internal Medicine

## 2017-05-09 ENCOUNTER — Other Ambulatory Visit: Payer: Medicare HMO

## 2017-05-11 LAB — HM DIABETES EYE EXAM

## 2017-05-15 DIAGNOSIS — H52221 Regular astigmatism, right eye: Secondary | ICD-10-CM | POA: Diagnosis not present

## 2017-05-15 DIAGNOSIS — Z794 Long term (current) use of insulin: Secondary | ICD-10-CM | POA: Diagnosis not present

## 2017-05-15 DIAGNOSIS — H524 Presbyopia: Secondary | ICD-10-CM | POA: Diagnosis not present

## 2017-05-15 DIAGNOSIS — Z961 Presence of intraocular lens: Secondary | ICD-10-CM | POA: Diagnosis not present

## 2017-05-15 DIAGNOSIS — H5211 Myopia, right eye: Secondary | ICD-10-CM | POA: Diagnosis not present

## 2017-05-15 DIAGNOSIS — H2512 Age-related nuclear cataract, left eye: Secondary | ICD-10-CM | POA: Diagnosis not present

## 2017-05-15 DIAGNOSIS — H52222 Regular astigmatism, left eye: Secondary | ICD-10-CM | POA: Diagnosis not present

## 2017-05-15 DIAGNOSIS — E119 Type 2 diabetes mellitus without complications: Secondary | ICD-10-CM | POA: Diagnosis not present

## 2017-05-16 LAB — HM DIABETES EYE EXAM

## 2017-05-23 ENCOUNTER — Encounter: Payer: Self-pay | Admitting: Internal Medicine

## 2017-05-23 ENCOUNTER — Ambulatory Visit (INDEPENDENT_AMBULATORY_CARE_PROVIDER_SITE_OTHER): Payer: Medicare HMO | Admitting: Internal Medicine

## 2017-05-23 VITALS — BP 172/84 | HR 60 | Temp 98.5°F | Ht 73.0 in | Wt 192.0 lb

## 2017-05-23 DIAGNOSIS — Z113 Encounter for screening for infections with a predominantly sexual mode of transmission: Secondary | ICD-10-CM

## 2017-05-23 DIAGNOSIS — Z720 Tobacco use: Secondary | ICD-10-CM

## 2017-05-23 DIAGNOSIS — B20 Human immunodeficiency virus [HIV] disease: Secondary | ICD-10-CM | POA: Diagnosis not present

## 2017-05-23 MED ORDER — EMTRICITAB-RILPIVIR-TENOFOV AF 200-25-25 MG PO TABS: 1.0000 | ORAL_TABLET | Freq: Every day | ORAL | 11 refills | Status: DC

## 2017-05-23 NOTE — Assessment & Plan Note (Signed)
Doing well.  I will transifition him to [facility].  rtc 6 months

## 2017-05-23 NOTE — Progress Notes (Signed)
   Subjective:    Patient ID: Nicholas Whitehead, male    DOB: 1955/04/25, 62 y.o.   MRN: [id number]  HPI Here for follow up of HIV Has been on Complera and denies any missed doses.  No labs prior to the visit.  No new issues.  Recent A1C good.  Last Cd4 990 and viral load < 20.  No labs prior to this visit.     Review of Systems  Constitutional: Negative for fatigue.  Gastrointestinal: Negative for diarrhea and nausea.  Skin: Negative for rash.       Objective:   Physical Exam  Constitutional: He appears well-developed and well-nourished.  HENT:  Mouth/Throat: No oropharyngeal exudate.  Eyes: No scleral icterus.  Cardiovascular: Normal rate, regular rhythm and normal heart sounds.  No murmur heard. Pulmonary/Chest: Effort normal and breath sounds normal. No respiratory distress.  Skin: No rash noted.    SH: remains tobacco free     Assessment & Plan:

## 2017-05-23 NOTE — Assessment & Plan Note (Signed)
Remains tobacco free.   

## 2017-05-23 NOTE — Assessment & Plan Note (Signed)
Will screen today 

## 2017-05-24 LAB — T-HELPER CELL (CD4) - (RCID CLINIC ONLY)
CD4 % Helper T Cell: 31 % — ABNORMAL LOW (ref 33–55)
CD4 T Cell Abs: 640 /uL (ref 400–2700)

## 2017-05-24 LAB — RPR: RPR Ser Ql: NONREACTIVE

## 2017-05-25 LAB — HIV-1 RNA QUANT-NO REFLEX-BLD
HIV 1 RNA Quant: <20 copies/mL
HIV-1 RNA Quant, Log: <1.3 Log copies/mL

## 2017-06-05 ENCOUNTER — Other Ambulatory Visit: Payer: Self-pay | Admitting: Internal Medicine

## 2017-06-05 DIAGNOSIS — F329 Major depressive disorder, single episode, unspecified: Secondary | ICD-10-CM

## 2017-06-05 DIAGNOSIS — F419 Anxiety disorder, unspecified: Principal | ICD-10-CM

## 2017-06-05 DIAGNOSIS — F32A Depression, unspecified: Secondary | ICD-10-CM

## 2017-06-05 MED ORDER — TRAZODONE HCL 300 MG PO TABS: 300.0000 mg | ORAL_TABLET | Freq: Every day | ORAL | 3 refills | Status: DC

## 2017-06-06 ENCOUNTER — Other Ambulatory Visit: Payer: Self-pay | Admitting: Internal Medicine

## 2017-06-06 DIAGNOSIS — B20 Human immunodeficiency virus [HIV] disease: Secondary | ICD-10-CM

## 2017-06-09 ENCOUNTER — Other Ambulatory Visit: Payer: Self-pay | Admitting: Endocrinology

## 2017-06-12 ENCOUNTER — Encounter: Payer: Self-pay | Admitting: Internal Medicine

## 2017-06-12 ENCOUNTER — Other Ambulatory Visit: Payer: Self-pay | Admitting: Internal Medicine

## 2017-06-12 ENCOUNTER — Ambulatory Visit (INDEPENDENT_AMBULATORY_CARE_PROVIDER_SITE_OTHER): Payer: Medicare HMO | Admitting: Internal Medicine

## 2017-06-12 VITALS — BP 120/58 | HR 66 | Temp 97.8°F | Resp 16 | Ht 73.0 in | Wt 187.8 lb

## 2017-06-12 DIAGNOSIS — F409 Phobic anxiety disorder, unspecified: Secondary | ICD-10-CM | POA: Diagnosis not present

## 2017-06-12 DIAGNOSIS — F5105 Insomnia due to other mental disorder: Secondary | ICD-10-CM

## 2017-06-12 DIAGNOSIS — N400 Enlarged prostate without lower urinary tract symptoms: Secondary | ICD-10-CM

## 2017-06-12 DIAGNOSIS — E559 Vitamin D deficiency, unspecified: Secondary | ICD-10-CM | POA: Diagnosis not present

## 2017-06-12 DIAGNOSIS — I10 Essential (primary) hypertension: Secondary | ICD-10-CM | POA: Diagnosis not present

## 2017-06-12 MED ORDER — MELATONIN 3 MG PO TABS: 6.0000 mg | ORAL_TABLET | Freq: Every day | ORAL | 1 refills | Status: DC

## 2017-06-12 NOTE — Patient Instructions (Signed)

## 2017-06-12 NOTE — Progress Notes (Signed)
Subjective:  Patient ID: Nicholas Whitehead, male    DOB: 05/24/1955  Age: 62 y.o. MRN: [id number]  CC: Hypertension and Diabetes   HPI [name] presents for f/up -he continues to complain of insomnia.  Mostly frequent awakenings.  He is gotten minimal symptom relief with the combination of trazodone and Lunesta.  The insomnia contributes to next-day fatigue and anxiety.  Outpatient Medications Prior to Visit  Medication Sig Dispense Refill  . ACCU-CHEK FASTCLIX LANCETS MISC USE  TO CHECK BLOOD SUGAR THREE TIMES DAILY 306 each 1  . ACCU-CHEK GUIDE test strip USE TO CHECK BLOOD SUGAR 3 TIMES DAILY. 200 each 2  . albuterol (PROVENTIL HFA;VENTOLIN HFA) 108 (90 Base) MCG/ACT inhaler Inhale 2 puffs into the lungs every 6 (six) hours as needed for wheezing or shortness of breath. 1 Inhaler 5  . aspirin EC 81 MG tablet Take 81 mg by mouth every morning.    . B-D ULTRAFINE III SHORT PEN 31G X 8 MM MISC USE AS DIRECTED FIVE TIMES A DAY 300 each 1  . cetirizine (ZYRTEC) 10 MG tablet Take 10 mg by mouth daily.    . Cholecalciferol 2000 units TABS Take 1 tablet (2,000 Units total) by mouth daily. 90 tablet 1  . DULoxetine (CYMBALTA) 60 MG capsule Take 1 capsule (60 mg total) by mouth daily. 90 capsule 1  . emtricitabine-rilpivir-tenofovir AF (ODEFSEY) 200-25-25 MG TABS tablet Take 1 tablet by mouth daily with breakfast. 30 tablet 11  . furosemide (LASIX) 20 MG tablet Take 1 tablet (20 mg total) by mouth daily. 30 tablet 3  . gabapentin (NEURONTIN) 300 MG capsule TAKE 1 CAPSULE BY MOUTH THREE TIMES A DAY 90 capsule 0  . Glycopyrrolate-Formoterol (BEVESPI AEROSPHERE) 9-4.8 MCG/ACT AERO Inhale 2 puffs into the lungs 2 (two) times daily. 10.7 g 11  . Insulin Glargine (TOUJEO MAX SOLOSTAR) 300 UNIT/ML SOPN Inject 80 Units into the skin 2 (two) times daily. Take 80 units in the morning and 70 the evening 6 pen 3  . linaclotide (LINZESS) 72 MCG capsule Take 1 capsule (72 mcg total) by mouth daily before  breakfast. 90 capsule 1  . losartan (COZAAR) 100 MG tablet Take 1 tablet (100 mg total) by mouth daily. 90 tablet 1  . metFORMIN (GLUCOPHAGE-XR) 500 MG 24 hr tablet 2 TABLETS BY MOUTH TWICE A DAY 360 tablet 1  . MULTIPLE VITAMIN PO Take 1 tablet by mouth.    . nebivolol (BYSTOLIC) 10 MG tablet Take 2 tablets (20 mg total) by mouth daily. 60 tablet 11  . NOVOLOG FLEXPEN 100 UNIT/ML FlexPen inject 30 units subcutaneously before meals 30 mL 1  . pioglitazone (ACTOS) 30 MG tablet TAKE 1 TABLET BY MOUTH ONCE DAILY. NEEDS APPOINTMENT FOR FURTHER REFILLS 90 tablet 0  . trazodone (DESYREL) 300 MG tablet Take 1 tablet (300 mg total) by mouth at bedtime. 90 tablet 3  . triamcinolone cream (KENALOG) 0.5 % APPLY 1 APPLICATION TOPICALLY THREE TIME A DAY 100 g 1  . Eszopiclone 3 MG TABS Take 1 tablet (3 mg total) at bedtime by mouth. Take immediately before bedtime 30 tablet 5   No facility-administered medications prior to visit.     ROS Review of Systems  Constitutional: Positive for fatigue. Negative for chills, diaphoresis and fever.  HENT: Negative.  Negative for sinus pressure and sore throat.   Eyes: Negative for visual disturbance.  Respiratory: Negative for cough, chest tightness, shortness of breath and stridor.   Cardiovascular: Negative for chest pain, palpitations  and leg swelling.  Gastrointestinal: Negative for abdominal pain, constipation, diarrhea, nausea and vomiting.  Genitourinary: Negative.  Negative for decreased urine volume, difficulty urinating, genital sores and urgency.  Musculoskeletal: Negative.  Negative for arthralgias and myalgias.  Skin: Negative.  Negative for color change, pallor and rash.  Neurological: Negative.  Negative for dizziness and weakness.  Hematological: Negative for adenopathy. Does not bruise/bleed easily.  Psychiatric/Behavioral: Positive for sleep disturbance. Negative for behavioral problems, confusion, decreased concentration, dysphoric mood,  self-injury and suicidal ideas. The patient is nervous/anxious.     Objective:  BP (!) 120/58 (BP Location: Left Arm, Patient Position: Sitting, Cuff Size: Normal)   Pulse 66   Temp 97.8 F (36.6 C) (Oral)   Resp 16   Ht 6\' 1"  (1.854 m)   Wt 187 lb 12 oz (85.2 kg)   SpO2 97%   BMI 24.77 kg/m   BP Readings from Last 3 Encounters:  06/12/17 (!) 120/58  05/23/17 (!) 172/84  04/04/17 140/60    Wt Readings from Last 3 Encounters:  06/12/17 187 lb 12 oz (85.2 kg)  05/23/17 192 lb (87.1 kg)  04/04/17 191 lb 8 oz (86.9 kg)    Physical Exam  Constitutional: He is oriented to person, place, and time. No distress.  HENT:  Mouth/Throat: Oropharynx is clear and moist. No oropharyngeal exudate.  Eyes: Conjunctivae are normal. No scleral icterus.  Neck: Normal range of motion. Neck supple. No JVD present. No thyromegaly present.  Cardiovascular: Normal rate, regular rhythm and normal heart sounds. Exam reveals no gallop.  No murmur heard. Pulmonary/Chest: Effort normal and breath sounds normal. No respiratory distress. He has no wheezes. He has no rales.  Abdominal: Soft. Bowel sounds are normal. He exhibits no mass. There is no hepatosplenomegaly. There is no tenderness.  Musculoskeletal: Normal range of motion. He exhibits no edema, tenderness or deformity.  Lymphadenopathy:    He has no cervical adenopathy.  Neurological: He is alert and oriented to person, place, and time.  Skin: Skin is warm and dry. No rash noted. He is not diaphoretic.  Psychiatric: He has a normal mood and affect. His behavior is normal. Judgment and thought content normal.  Vitals reviewed.   Lab Results  Component Value Date   WBC 10.6 (H) 04/04/2017   HGB 14.5 04/04/2017   HCT 42.5 04/04/2017   PLT 186.0 04/04/2017   GLUCOSE 70 04/04/2017   CHOL 131 12/26/2016   TRIG 75.0 12/26/2016   HDL 45.10 12/26/2016   LDLCALC 71 12/26/2016   ALT 26 10/24/2016   AST 31 10/24/2016   NA 141 04/04/2017   K  3.7 04/04/2017   CL 104 04/04/2017   CREATININE 0.78 04/04/2017   BUN 6 04/04/2017   CO2 30 04/04/2017   TSH 1.04 10/24/2016   PSA 0.82 03/25/2015   HGBA1C 6.4 04/04/2017   MICROALBUR 0.8 10/24/2016    Dg Abdomen 1 View  Result Date: 09/11/2016 CLINICAL DATA:  Abdominal pain.  Constipation. EXAM: ABDOMEN - 1 VIEW COMPARISON:  None. FINDINGS: No dilated small bowel loops. Mild gas and stool in the large bowel. No evidence of pneumatosis or pneumoperitoneum. No radiopaque urolithiasis. Mild lumbar spondylosis. IMPRESSION: Nonobstructive bowel gas pattern.  Mild colonic stool burden. Electronically Signed   By: Ilona Sorrel M.D.   On: 09/11/2016 14:28    Assessment & Plan:   Josimar was seen today for hypertension and diabetes.  Diagnoses and all orders for this visit:  Benign prostatic hyperplasia without lower urinary tract  symptoms- He has no symptoms that need to be treated.  I will check his PSA to screen for prostate cancer. -     PSA; Future  Essential hypertension- His blood pressure is adequately well controlled.  Insomnia due to anxiety and fear- Will add melatonin to the trazodone and eszopiclone to improve treatment of the insomnia. -     Melatonin 3 MG TABS; Take 2 tablets (6 mg total) by mouth at bedtime. -     Eszopiclone 3 MG TABS; Take 1 tablet (3 mg total) by mouth at bedtime. Take immediately before bedtime  Vitamin D deficiency -     VITAMIN D 25 Hydroxy (Vit-D Deficiency, Fractures); Future   I have changed Clester Hearn's Eszopiclone. I am also having him start on Melatonin. Additionally, I am having him maintain his cetirizine, aspirin EC, ACCU-CHEK FASTCLIX LANCETS, triamcinolone cream, Glycopyrrolate-Formoterol, MULTIPLE VITAMIN PO, albuterol, linaclotide, furosemide, ACCU-CHEK GUIDE, Insulin Glargine, nebivolol, metFORMIN, NOVOLOG FLEXPEN, DULoxetine, Cholecalciferol, losartan, pioglitazone, B-D ULTRAFINE III SHORT PEN, emtricitabine-rilpivir-tenofovir AF,  trazodone, and gabapentin.  Meds ordered this encounter  Medications  . Melatonin 3 MG TABS    Sig: Take 2 tablets (6 mg total) by mouth at bedtime.    Dispense:  180 tablet    Refill:  1  . Eszopiclone 3 MG TABS    Sig: Take 1 tablet (3 mg total) by mouth at bedtime. Take immediately before bedtime    Dispense:  30 tablet    Refill:  5     Follow-up: Return in about 6 months (around 12/12/2017).  Scarlette Calico, MD

## 2017-06-13 ENCOUNTER — Other Ambulatory Visit: Payer: Medicare HMO

## 2017-06-13 DIAGNOSIS — Z794 Long term (current) use of insulin: Principal | ICD-10-CM

## 2017-06-13 DIAGNOSIS — E1165 Type 2 diabetes mellitus with hyperglycemia: Secondary | ICD-10-CM | POA: Diagnosis not present

## 2017-06-13 MED ORDER — ESZOPICLONE 3 MG PO TABS: 3.0000 mg | ORAL_TABLET | Freq: Every day | ORAL | 5 refills | Status: DC

## 2017-06-14 LAB — FRUCTOSAMINE: Fructosamine: 270 umol/L (ref 0–285)

## 2017-06-17 IMAGING — NM NM MYOCAR MULTI W/ SPECT
6 series · 36 of 36 positions shown · non-contrast
Comparison: none

[Series 1: wbr_r-proj_st rest · 6.51mm/px · 6 of 64 frames shown]
[frame 6/64]
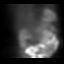
[frame 16/64]
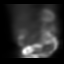
[frame 27/64]
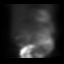
[frame 38/64]
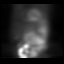
[frame 48/64]
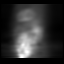
[frame 59/64]
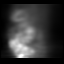

[Series 1: rest · 6.51mm/px · 6 of 64 frames shown]
[frame 6/64]
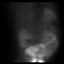
[frame 16/64]
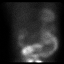
[frame 27/64]
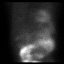
[frame 38/64]
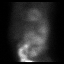
[frame 48/64]
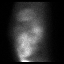
[frame 59/64]
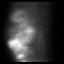

[Series 2: wbr_s-proj_st stress · 6.51mm/px · 6 of 64 frames shown (1 of 2)]
[frame 6/64]
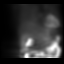
[frame 16/64]
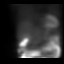
[frame 27/64]
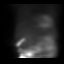
[frame 38/64]
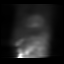
[frame 48/64]
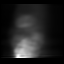
[frame 59/64]
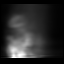

[Series 2: stress · 6.51mm/px · 6 of 64 frames shown (1 of 2)]
[frame 6/64]
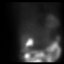
[frame 16/64]
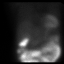
[frame 27/64]
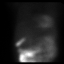
[frame 38/64]
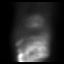
[frame 48/64]
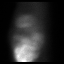
[frame 59/64]
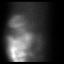

[Series 2: stress · 6.51mm/px · 6 of 512 frames shown (2 of 2)]
[frame 43/512]
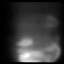
[frame 128/512]
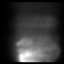
[frame 214/512]
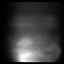
[frame 299/512]
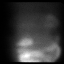
[frame 384/512]
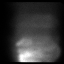
[frame 470/512]
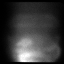

[Series 2: wbr_s-proj_st stress · 6.51mm/px · 6 of 512 frames shown (2 of 2)]
[frame 43/512]
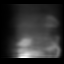
[frame 128/512]
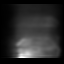
[frame 214/512]
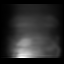
[frame 299/512]
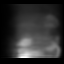
[frame 384/512]
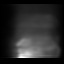
[frame 470/512]
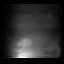

[36 of 36 positions shown; findings below may reference images not displayed]

Canned report from images found in remote index.

Refer to host system for actual result text.

## 2017-06-18 ENCOUNTER — Ambulatory Visit (INDEPENDENT_AMBULATORY_CARE_PROVIDER_SITE_OTHER): Payer: Medicare HMO | Admitting: Endocrinology

## 2017-06-18 ENCOUNTER — Encounter: Payer: Self-pay | Admitting: Internal Medicine

## 2017-06-18 ENCOUNTER — Encounter: Payer: Self-pay | Admitting: Endocrinology

## 2017-06-18 VITALS — BP 130/72 | HR 82 | Ht 73.0 in | Wt 193.2 lb

## 2017-06-18 DIAGNOSIS — Z794 Long term (current) use of insulin: Secondary | ICD-10-CM | POA: Diagnosis not present

## 2017-06-18 DIAGNOSIS — E1165 Type 2 diabetes mellitus with hyperglycemia: Secondary | ICD-10-CM

## 2017-06-18 NOTE — Progress Notes (Signed)
Patient ID: Nicholas Whitehead, male   DOB: [birth year], 62 y.o.   MRN: [id number]   Reason for Appointment: Type II Diabetes follow-up   History of Present Illness   Diagnosis date: 1998    Previous history: He was started on oral agents at diagnosis and around 2006 was put on insulin His blood sugars are usually well controlled when he takes Actos along with his insulin. He had been on Invokana since 04/2013  Recent history:   Insulin regimen: Toujeo 80 units bid;  Novolog  30 AC tid         Oral hypoglycemic drugs: Metformin ER 500 mg twice a day, Actos 30 mg   He requires large doses of insulin especially basal insulin which he takes twice a day    A1c has been consistently high in 2018 but in March was 6.4  Fructosamine is 279   Current management, blood sugar patterns and problems:  He has not been seen in follow-up since 11/2016  He did not bring his monitor for download and not clear if he is having any consistent patterns of high or low sugars  Previously has had significant fluctuation of his blood sugars at all times  He claims that his blood sugars are no more than 164 and no consistent high readings  Also he thinks that even though he may have had a reading of 65 in the morning he is not getting any low sugars overnight  He was told to take 70 units of TOUJEO in the evening when changing from Lantus but he takes 80 on his own  Currently no side effects with 2 g of metformin  No edema with taking 30 mg Actos  which has helped his insulin sensitivity      Side effects from medications:  urgency, frequent urination from Invokana Proper timing of medications in relation to meals: Yes.          Monitors blood glucose:  3+ times a day  Glucometer:  Accu-Chek  Recent range 65-164  Mean values apply above for all meters except median for One Touch  PRE-MEAL Fasting Lunch Dinner Bedtime Overall  Glucose range: 65-125  -164 <140   Mean/median: 105                Meals: Breakfast variable, 9-10 AM Lunch 2-3pm  usually avoiding high-fat meals, dinner at about 6 pm         Physical activity: exercise: limited  Dietician visit: Most recent: 2013    Weight control:  Wt Readings from Last 3 Encounters:  06/18/17 193 lb 3.2 oz (87.6 kg)  06/12/17 187 lb 12 oz (85.2 kg)  05/23/17 192 lb (87.1 kg)          Diabetes labs:  Lab Results  Component Value Date   HGBA1C 6.4 04/04/2017   HGBA1C 7.9 (H) 12/26/2016   HGBA1C 8.3 (H) 08/22/2016   Lab Results  Component Value Date   MICROALBUR 0.8 10/24/2016   LDLCALC 71 12/26/2016   CREATININE 0.78 04/04/2017    Other active problems discussed today: See review of systems   Documentation on 06/18/2017  Component Date Value Ref Range Status  . HM Diabetic Eye Exam 05/16/2017 No Retinopathy  No Retinopathy Final  Lab on 06/13/2017  Component Date Value Ref Range Status  . Fructosamine 06/13/2017 270  0 - 285 umol/L Final   Comment: Published reference interval for apparently healthy subjects between age 71 and 37 is 65 -  285 umol/L and in a poorly controlled diabetic population is 228 - 563 umol/L with a mean of 396 umol/L.   Office Visit on 06/12/2017  Component Date Value Ref Range Status  . HM Diabetic Eye Exam 05/11/2017 No Retinopathy  No Retinopathy Final     Allergies as of 06/18/2017      Reactions   Benazepril Cough   Pravastatin Other (See Comments)   myositis   Glipizide Nausea Only      Medication List        Accurate as of 06/18/17  2:01 PM. Always use your most recent med list.          ACCU-CHEK FASTCLIX LANCETS Misc USE  TO CHECK BLOOD SUGAR THREE TIMES DAILY   ACCU-CHEK GUIDE test strip Generic drug:  glucose blood USE TO CHECK BLOOD SUGAR 3 TIMES DAILY.   albuterol 108 (90 Base) MCG/ACT inhaler Commonly known as:  PROVENTIL HFA;VENTOLIN HFA Inhale 2 puffs into the lungs every 6 (six) hours as needed for wheezing or shortness of breath.    aspirin EC 81 MG tablet Take 81 mg by mouth every morning.   B-D ULTRAFINE III SHORT PEN 31G X 8 MM Misc Generic drug:  Insulin Pen Needle USE AS DIRECTED FIVE TIMES A DAY   cetirizine 10 MG tablet Commonly known as:  ZYRTEC Take 10 mg by mouth daily.   Cholecalciferol 2000 units Tabs Take 1 tablet (2,000 Units total) by mouth daily.   DULoxetine 60 MG capsule Commonly known as:  CYMBALTA Take 1 capsule (60 mg total) by mouth daily.   emtricitabine-rilpivir-tenofovir AF 200-25-25 MG Tabs tablet Commonly known as:  ODEFSEY Take 1 tablet by mouth daily with breakfast.   Eszopiclone 3 MG Tabs Take 1 tablet (3 mg total) by mouth at bedtime. Take immediately before bedtime   furosemide 20 MG tablet Commonly known as:  LASIX Take 1 tablet (20 mg total) by mouth daily.   gabapentin 300 MG capsule Commonly known as:  NEURONTIN TAKE 1 CAPSULE BY MOUTH THREE TIMES A DAY   Glycopyrrolate-Formoterol 9-4.8 MCG/ACT Aero Commonly known as:  BEVESPI AEROSPHERE Inhale 2 puffs into the lungs 2 (two) times daily.   Insulin Glargine 300 UNIT/ML Sopn Commonly known as:  TOUJEO MAX SOLOSTAR Inject 80 Units into the skin 2 (two) times daily. Take 80 units in the morning and 70 the evening   linaclotide 72 MCG capsule Commonly known as:  LINZESS Take 1 capsule (72 mcg total) by mouth daily before breakfast.   losartan 100 MG tablet Commonly known as:  COZAAR Take 1 tablet (100 mg total) by mouth daily.   Melatonin 3 MG Tabs Take 2 tablets (6 mg total) by mouth at bedtime.   metFORMIN 500 MG 24 hr tablet Commonly known as:  GLUCOPHAGE-XR 2 TABLETS BY MOUTH TWICE A DAY   MULTIPLE VITAMIN PO Take 1 tablet by mouth.   nebivolol 10 MG tablet Commonly known as:  BYSTOLIC Take 2 tablets (20 mg total) by mouth daily.   NOVOLOG FLEXPEN 100 UNIT/ML FlexPen Generic drug:  insulin aspart inject 30 units subcutaneously before meals   pioglitazone 30 MG tablet Commonly known as:   ACTOS TAKE 1 TABLET BY MOUTH ONCE DAILY. NEEDS APPOINTMENT FOR FURTHER REFILLS   trazodone 300 MG tablet Commonly known as:  DESYREL Take 1 tablet (300 mg total) by mouth at bedtime.   triamcinolone cream 0.5 % Commonly known as:  KENALOG APPLY 1 APPLICATION TOPICALLY THREE TIME A DAY  Allergies:  Allergies  Allergen Reactions  . Benazepril Cough  . Pravastatin Other (See Comments)    myositis  . Glipizide Nausea Only    Past Medical History:  Diagnosis Date  . Anxiety   . Cataract    right   . Depression   . Diabetes mellitus without complication ([place])   . DM (diabetes mellitus screen)   . HIV disease ([place]) 09/12/1995  . HIV infection ([place])   . Hypercholesterolemia   . Hyperlipidemia   . Hypertension     Past Surgical History:  Procedure Laterality Date  . COLONOSCOPY WITH PROPOFOL  01/16/2012   Procedure: COLONOSCOPY WITH PROPOFOL;  Surgeon: Garlan Fair, MD;  Location: [facility];  Service: Endoscopy;  Laterality: N/A;  . EYE SURGERY  2005   catarct surgery  . optic lens surgery  2005    Family History  Problem Relation Age of Onset  . Arthritis Mother   . Diabetes Mother   . Heart disease Mother   . Stroke Mother   . Arthritis Father   . Diabetes Father   . Heart disease Father     Social History:  reports that he quit smoking about 8 months ago. His smoking use included cigarettes. He smoked 0.25 packs per day. He has never used smokeless tobacco. He reports that he does not drink alcohol or use drugs.  Review of Systems:   Hypertension:  currently on Bystolic  and on losartan 100 mg  tablet daily Also on Lasix as needed  Lab Results  Component Value Date   CREATININE 0.78 04/04/2017   BUN 6 04/04/2017   NA 141 04/04/2017   K 3.7 04/04/2017   CL 104 04/04/2017   CO2 30 04/04/2017     Lipids: LDL is normal as of 12/18 Previously pravastatin stopped by his PCP   Lab Results  Component Value Date   CHOL 131 12/26/2016   HDL  45.10 12/26/2016   LDLCALC 71 12/26/2016   TRIG 75.0 12/26/2016   CHOLHDL 3 12/26/2016    He has symptoms of neuropathy with paresthesia in feet and legs, Treated with gabapentin 300 mg 3 times a day      LABS:  Documentation on 06/18/2017  Component Date Value Ref Range Status  . HM Diabetic Eye Exam 05/16/2017 No Retinopathy  No Retinopathy Final  Lab on 06/13/2017  Component Date Value Ref Range Status  . Fructosamine 06/13/2017 270  0 - 285 umol/L Final   Comment: Published reference interval for apparently healthy subjects between age 45 and 42 is 37 - 285 umol/L and in a poorly controlled diabetic population is 228 - 563 umol/L with a mean of 396 umol/L.   Office Visit on 06/12/2017  Component Date Value Ref Range Status  . HM Diabetic Eye Exam 05/11/2017 No Retinopathy  No Retinopathy Final     Examination:   BP 130/72 (BP Location: Left Arm, Patient Position: Sitting, Cuff Size: Normal)   Pulse 82   Ht 6\' 1"  (1.854 m)   Wt 193 lb 3.2 oz (87.6 kg)   SpO2 94%   BMI 25.49 kg/m   Body mass index is 25.49 kg/m.   No lower leg edema   ASSESSMENT/ PLAN:    Diabetes type 2:  See history of present illness for detailed discussion of his current management, blood sugar patterns and problems identified  His A1c is much better at 6.4 as of March but fructosamine of 270 also indicates recently good control  Unable to review his blood sugar readings from his monitor since he did not bring this but he reports excellent readings without hypoglycemia He is taking relatively more insulin with Toujeo compared to Lantus However appears to have somewhat less fluctuation historically by recall of his blood sugars compared to when he was on Lantus No need to change his insulin unless he starts getting low sugars overnight and he will call  A1c in 3 months  Hypertension  blood pressure is controlled   There are no Patient Instructions on file for this visit.      Elayne Snare 06/18/2017, 2:01 PM         ..

## 2017-07-05 ENCOUNTER — Other Ambulatory Visit: Payer: Self-pay | Admitting: Endocrinology

## 2017-07-10 ENCOUNTER — Other Ambulatory Visit: Payer: Self-pay | Admitting: Endocrinology

## 2017-07-13 ENCOUNTER — Other Ambulatory Visit: Payer: Self-pay | Admitting: Endocrinology

## 2017-07-13 ENCOUNTER — Other Ambulatory Visit: Payer: Self-pay | Admitting: Internal Medicine

## 2017-07-13 DIAGNOSIS — Z794 Long term (current) use of insulin: Secondary | ICD-10-CM

## 2017-07-13 DIAGNOSIS — E1140 Type 2 diabetes mellitus with diabetic neuropathy, unspecified: Secondary | ICD-10-CM

## 2017-07-13 DIAGNOSIS — I10 Essential (primary) hypertension: Secondary | ICD-10-CM

## 2017-07-16 ENCOUNTER — Other Ambulatory Visit: Payer: Self-pay | Admitting: Endocrinology

## 2017-08-15 ENCOUNTER — Ambulatory Visit (INDEPENDENT_AMBULATORY_CARE_PROVIDER_SITE_OTHER): Payer: Medicare HMO | Admitting: Internal Medicine

## 2017-08-15 ENCOUNTER — Encounter: Payer: Self-pay | Admitting: Internal Medicine

## 2017-08-15 VITALS — BP 132/74 | HR 71 | Temp 98.3°F | Resp 16 | Ht 73.0 in | Wt 194.5 lb

## 2017-08-15 DIAGNOSIS — J410 Simple chronic bronchitis: Secondary | ICD-10-CM

## 2017-08-15 DIAGNOSIS — E1140 Type 2 diabetes mellitus with diabetic neuropathy, unspecified: Secondary | ICD-10-CM | POA: Diagnosis not present

## 2017-08-15 DIAGNOSIS — I10 Essential (primary) hypertension: Secondary | ICD-10-CM | POA: Diagnosis not present

## 2017-08-15 DIAGNOSIS — Z794 Long term (current) use of insulin: Secondary | ICD-10-CM

## 2017-08-15 NOTE — Progress Notes (Signed)
Subjective:  Patient ID: Nicholas Whitehead, male    DOB: 1955-02-15  Age: 62 y.o. MRN: [id number]  CC: COPD and Hypertension   HPI Imad Shostak presents for f/up - He complains of intermittent episodes of shortness of breath and nonproductive cough.  He gets some, but not much symptom relief with his albuterol inhaler.  He denies any recent episodes of chest pain, night sweats, fever, chills, or weight loss.  Outpatient Medications Prior to Visit  Medication Sig Dispense Refill  . ACCU-CHEK FASTCLIX LANCETS MISC USE  TO CHECK BLOOD SUGAR THREE TIMES DAILY 306 each 1  . ACCU-CHEK GUIDE test strip USE TO CHECK BLOOD SUGAR 3 TIMES DAILY. 200 each 2  . albuterol (PROVENTIL HFA;VENTOLIN HFA) 108 (90 Base) MCG/ACT inhaler Inhale 2 puffs into the lungs every 6 (six) hours as needed for wheezing or shortness of breath. 1 Inhaler 5  . aspirin EC 81 MG tablet Take 81 mg by mouth every morning.    . B-D ULTRAFINE III SHORT PEN 31G X 8 MM MISC USE AS DIRECTED FIVE TIMES A DAY 300 each 1  . Blood Glucose Monitoring Suppl (TRUE METRIX AIR GLUCOSE METER) w/Device KIT     . cetirizine (ZYRTEC) 10 MG tablet Take 10 mg by mouth daily.    . Cholecalciferol 2000 units TABS Take 1 tablet (2,000 Units total) by mouth daily. 90 tablet 1  . DULoxetine (CYMBALTA) 60 MG capsule Take 1 capsule (60 mg total) by mouth daily. 90 capsule 1  . emtricitabine-rilpivir-tenofovir AF (ODEFSEY) 200-25-25 MG TABS tablet Take 1 tablet by mouth daily with breakfast. 30 tablet 11  . Eszopiclone 3 MG TABS Take 1 tablet (3 mg total) by mouth at bedtime. Take immediately before bedtime 30 tablet 5  . furosemide (LASIX) 20 MG tablet Take 1 tablet (20 mg total) by mouth daily. 30 tablet 3  . gabapentin (NEURONTIN) 300 MG capsule TAKE 1 CAPSULE BY MOUTH THREE TIMES A DAY 90 capsule 0  . Glycopyrrolate-Formoterol (BEVESPI AEROSPHERE) 9-4.8 MCG/ACT AERO Inhale 2 puffs into the lungs 2 (two) times daily. 10.7 g 11  . linaclotide (LINZESS) 72  MCG capsule Take 1 capsule (72 mcg total) by mouth daily before breakfast. 90 capsule 1  . losartan (COZAAR) 100 MG tablet TAKE 1 TABLET(100 MG) BY MOUTH DAILY 90 tablet 1  . Melatonin 3 MG TABS Take 2 tablets (6 mg total) by mouth at bedtime. 180 tablet 1  . metFORMIN (GLUCOPHAGE-XR) 500 MG 24 hr tablet 2 TABLETS BY MOUTH TWICE A DAY 360 tablet 1  . MULTIPLE VITAMIN PO Take 1 tablet by mouth.    . nebivolol (BYSTOLIC) 10 MG tablet Take 2 tablets (20 mg total) by mouth daily. 60 tablet 11  . NOVOLOG FLEXPEN 100 UNIT/ML FlexPen inject 30 units subcutaneously before meals 30 mL 1  . pioglitazone (ACTOS) 30 MG tablet TAKE 1 TABLET BY MOUTH ONCE DAILY. NEEDS APPOINTMENT FOR FURTHER REFILLS 90 tablet 0  . TOUJEO MAX SOLOSTAR 300 UNIT/ML SOPN INJECT 80 UNITS SUBCUTANEOUSLY EVERY MORNING AND 70 UNITS EVERY EVENING 10 pen 1  . trazodone (DESYREL) 300 MG tablet Take 1 tablet (300 mg total) by mouth at bedtime. 90 tablet 3  . triamcinolone cream (KENALOG) 0.5 % APPLY 1 APPLICATION TOPICALLY THREE TIME A DAY 100 g 1  . pioglitazone (ACTOS) 30 MG tablet TAKE 1 TABLET BY MOUTH ONCE DAILY. NEEDS APPOINTMENT FOR FURTHER REFILLS 90 tablet 0  . pioglitazone (ACTOS) 30 MG tablet TAKE 1 TABLET BY MOUTH  ONCE DAILY. NEEDS APPOINTMENT FOR FURTHER REFILLS 90 tablet 0   No facility-administered medications prior to visit.     ROS Review of Systems  Constitutional: Negative for chills, diaphoresis and fatigue.  HENT: Negative.  Negative for sore throat and trouble swallowing.   Eyes: Negative.   Respiratory: Positive for cough and shortness of breath. Negative for chest tightness and wheezing.   Cardiovascular: Negative for chest pain, palpitations and leg swelling.  Gastrointestinal: Negative for abdominal pain, constipation, diarrhea, nausea and vomiting.  Endocrine: Negative.   Genitourinary: Negative.  Negative for difficulty urinating.  Musculoskeletal: Negative.   Skin: Negative for color change and rash.    Neurological: Negative.  Negative for dizziness, weakness and light-headedness.  Hematological: Negative for adenopathy. Does not bruise/bleed easily.  Psychiatric/Behavioral: Negative.     Objective:  BP 132/74 (BP Location: Left Arm, Patient Position: Sitting, Cuff Size: Normal)   Pulse 71   Temp 98.3 F (36.8 C) (Oral)   Resp 16   Ht _0  (1.854 m)   Wt 194 lb 8 oz (88.2 kg)   SpO2 95%   BMI 25.66 kg/m   BP Readings from Last 3 Encounters:  08/15/17 132/74  06/18/17 130/72  06/12/17 (!) 120/58    Wt Readings from Last 3 Encounters:  08/15/17 194 lb 8 oz (88.2 kg)  06/18/17 193 lb 3.2 oz (87.6 kg)  06/12/17 187 lb 12 oz (85.2 kg)    Physical Exam  Constitutional: He is oriented to person, place, and time. No distress.  HENT:  Mouth/Throat: Oropharynx is clear and moist. No oropharyngeal exudate.  Eyes: Conjunctivae are normal. No scleral icterus.  Neck: Normal range of motion. Neck supple. No JVD present. No thyromegaly present.  Cardiovascular: Normal rate, regular rhythm and normal heart sounds. Exam reveals no gallop.  No murmur heard. Pulmonary/Chest: Effort normal and breath sounds normal. No stridor. No respiratory distress. He has no wheezes. He has no rhonchi. He has no rales.  Abdominal: Soft. Bowel sounds are normal. He exhibits no mass. There is no hepatosplenomegaly. There is no tenderness.  Musculoskeletal: Normal range of motion. He exhibits no edema, tenderness or deformity.  Lymphadenopathy:    He has no cervical adenopathy.  Neurological: He is alert and oriented to person, place, and time.  Skin: Skin is warm and dry. No rash noted. He is not diaphoretic.  Psychiatric: He has a normal mood and affect. His behavior is normal. Judgment and thought content normal.  Vitals reviewed.   Lab Results  Component Value Date   WBC 10.6 (H) 04/04/2017   HGB 14.5 04/04/2017   HCT 42.5 04/04/2017   PLT 186.0 04/04/2017   GLUCOSE 70 04/04/2017   CHOL 131  12/26/2016   TRIG 75.0 12/26/2016   HDL 45.10 12/26/2016   LDLCALC 71 12/26/2016   ALT 26 10/24/2016   AST 31 10/24/2016   NA 141 04/04/2017   K 3.7 04/04/2017   CL 104 04/04/2017   CREATININE 0.78 04/04/2017   BUN 6 04/04/2017   CO2 30 04/04/2017   TSH 1.04 10/24/2016   PSA 0.82 03/25/2015   HGBA1C 6.4 04/04/2017   MICROALBUR 0.8 10/24/2016    Dg Abdomen 1 View  Result Date: 09/11/2016 CLINICAL DATA:  Abdominal pain.  Constipation. EXAM: ABDOMEN - 1 VIEW COMPARISON:  None. FINDINGS: No dilated small bowel loops. Mild gas and stool in the large bowel. No evidence of pneumatosis or pneumoperitoneum. No radiopaque urolithiasis. Mild lumbar spondylosis. IMPRESSION: Nonobstructive bowel gas pattern.  Mild colonic stool burden. Electronically Signed   By: Ilona Sorrel M.D.   On: 09/11/2016 14:28    Assessment & Plan:   Kier was seen today for copd and hypertension.  Diagnoses and all orders for this visit:  Simple chronic bronchitis ([place])- I think he would benefit from using a LAMA inhaler, will cont the albuterol as needed -     Glycopyrrolate (LONHALA MAGNAIR REFILL KIT) 25 MCG/ML SOLN; Inhale 1 Act into the lungs 2 (two) times daily. -     Glycopyrrolate (LONHALA MAGNAIR STARTER KIT) 25 MCG/ML SOLN; Inhale 1 Act into the lungs 2 (two) times daily.  Essential hypertension- His BP is well controlled  Type 2 diabetes mellitus with diabetic neuropathy, with long-term current use of insulin ([place])- His blood sugars are well controlled   I am having Debbe Bales start on Glycopyrrolate and Glycopyrrolate. I am also having him maintain his cetirizine, aspirin EC, ACCU-CHEK FASTCLIX LANCETS, triamcinolone cream, Glycopyrrolate-Formoterol, MULTIPLE VITAMIN PO, albuterol, linaclotide, furosemide, ACCU-CHEK GUIDE, nebivolol, metFORMIN, NOVOLOG FLEXPEN, DULoxetine, Cholecalciferol, B-D ULTRAFINE III SHORT PEN, emtricitabine-rilpivir-tenofovir AF, trazodone, Melatonin, Eszopiclone, gabapentin,  losartan, pioglitazone, TOUJEO MAX SOLOSTAR, and TRUE METRIX AIR GLUCOSE METER.  Meds ordered this encounter  Medications  . Glycopyrrolate (LONHALA MAGNAIR REFILL KIT) 25 MCG/ML SOLN    Sig: Inhale 1 Act into the lungs 2 (two) times daily.    Dispense:  60 mL    Refill:  11  . Glycopyrrolate (LONHALA MAGNAIR STARTER KIT) 25 MCG/ML SOLN    Sig: Inhale 1 Act into the lungs 2 (two) times daily.    Dispense:  60 mL    Refill:  11     Follow-up: No follow-ups on file.  Scarlette Calico, MD

## 2017-08-16 ENCOUNTER — Encounter: Payer: Self-pay | Admitting: Internal Medicine

## 2017-08-16 MED ORDER — GLYCOPYRROLATE 25 MCG/ML IN SOLN: 1.0000 | Freq: Two times a day (BID) | RESPIRATORY_TRACT | 11 refills | Status: DC

## 2017-08-16 NOTE — Patient Instructions (Signed)
Cough, Adult  Coughing is a reflex that clears your throat and your airways. Coughing helps to heal and protect your lungs. It is normal to cough occasionally, but a cough that happens with other symptoms or lasts a long time may be a sign of a condition that needs treatment. A cough may last only 2-3 weeks (acute), or it may last longer than 8 weeks (chronic).  What are the causes?  Coughing is commonly caused by:   Breathing in substances that irritate your lungs.   A viral or bacterial respiratory infection.   Allergies.   Asthma.   Postnasal drip.   Smoking.   Acid backing up from the stomach into the esophagus (gastroesophageal reflux).   Certain medicines.   Chronic lung problems, including COPD (or rarely, lung cancer).   Other medical conditions such as heart failure.    Follow these instructions at home:  Pay attention to any changes in your symptoms. Take these actions to help with your discomfort:   Take medicines only as told by your health care provider.  ? If you were prescribed an antibiotic medicine, take it as told by your health care provider. Do not stop taking the antibiotic even if you start to feel better.  ? Talk with your health care provider before you take a cough suppressant medicine.   Drink enough fluid to keep your urine clear or pale yellow.   If the air is dry, use a cold steam vaporizer or humidifier in your bedroom or your home to help loosen secretions.   Avoid anything that causes you to cough at work or at home.   If your cough is worse at night, try sleeping in a semi-upright position.   Avoid cigarette smoke. If you smoke, quit smoking. If you need help quitting, ask your health care provider.   Avoid caffeine.   Avoid alcohol.   Rest as needed.    Contact a health care provider if:   You have new symptoms.   You cough up pus.   Your cough does not get better after 2-3 weeks, or your cough gets worse.   You cannot control your cough with suppressant  medicines and you are losing sleep.   You develop pain that is getting worse or pain that is not controlled with pain medicines.   You have a fever.   You have unexplained weight loss.   You have night sweats.  Get help right away if:   You cough up blood.   You have difficulty breathing.   Your heartbeat is very fast.  This information is not intended to replace advice given to you by your health care provider. Make sure you discuss any questions you have with your health care provider.  Document Released: 06/24/2010 Document Revised: 06/03/2015 Document Reviewed: 03/04/2014  Elsevier Interactive Patient Education  2018 [employer]

## 2017-08-23 ENCOUNTER — Telehealth: Payer: Self-pay | Admitting: *Deleted

## 2017-08-23 NOTE — Telephone Encounter (Signed)
I faxed 08/15/17 OV notes to [facility] per a fax request for Carma Leaven PA to [phone].  Ph #: [phone].

## 2017-09-08 ENCOUNTER — Other Ambulatory Visit: Payer: Self-pay | Admitting: Internal Medicine

## 2017-09-08 DIAGNOSIS — F419 Anxiety disorder, unspecified: Principal | ICD-10-CM

## 2017-09-08 DIAGNOSIS — F329 Major depressive disorder, single episode, unspecified: Secondary | ICD-10-CM

## 2017-09-13 ENCOUNTER — Other Ambulatory Visit: Payer: Self-pay | Admitting: Endocrinology

## 2017-09-20 ENCOUNTER — Telehealth: Payer: Self-pay

## 2017-09-20 ENCOUNTER — Other Ambulatory Visit: Payer: Self-pay | Admitting: Internal Medicine

## 2017-09-20 DIAGNOSIS — J410 Simple chronic bronchitis: Secondary | ICD-10-CM

## 2017-09-20 MED ORDER — GLYCOPYRROLATE-FORMOTEROL 9-4.8 MCG/ACT IN AERO: 2.0000 | INHALATION_SPRAY | Freq: Two times a day (BID) | RESPIRATORY_TRACT | 11 refills | Status: DC

## 2017-09-20 NOTE — Telephone Encounter (Signed)
Copied from [place] [id number]. Topic: Quick Communication - See Telephone Encounter >> Sep 17, 2017  2:36 PM Conception Chancy, NT wrote: CRM for notification. See Telephone encounter for: 09/17/17.  Patient is calling and states that his insurance is not going to cover the breathing treatment that Dr. [name] prescribed. Patient is unaware of the name.

## 2017-09-20 NOTE — Telephone Encounter (Signed)
Cloud sent

## 2017-09-20 NOTE — Telephone Encounter (Signed)
Is there an alternative the lonhala that patient can use?

## 2017-09-20 NOTE — Telephone Encounter (Signed)
Informed of new rx to replace the Lonhala.

## 2017-10-12 ENCOUNTER — Other Ambulatory Visit: Payer: Self-pay | Admitting: Internal Medicine

## 2017-10-12 ENCOUNTER — Other Ambulatory Visit: Payer: Self-pay | Admitting: Endocrinology

## 2017-10-12 DIAGNOSIS — Z794 Long term (current) use of insulin: Secondary | ICD-10-CM

## 2017-10-12 DIAGNOSIS — I10 Essential (primary) hypertension: Secondary | ICD-10-CM

## 2017-10-12 DIAGNOSIS — E1140 Type 2 diabetes mellitus with diabetic neuropathy, unspecified: Secondary | ICD-10-CM

## 2017-10-16 ENCOUNTER — Other Ambulatory Visit: Payer: Self-pay | Admitting: Endocrinology

## 2017-10-16 DIAGNOSIS — E119 Type 2 diabetes mellitus without complications: Secondary | ICD-10-CM | POA: Diagnosis not present

## 2017-10-18 ENCOUNTER — Other Ambulatory Visit: Payer: Self-pay | Admitting: Endocrinology

## 2017-10-23 ENCOUNTER — Encounter: Payer: Self-pay | Admitting: Internal Medicine

## 2017-10-23 ENCOUNTER — Other Ambulatory Visit (INDEPENDENT_AMBULATORY_CARE_PROVIDER_SITE_OTHER): Payer: Medicare HMO

## 2017-10-23 ENCOUNTER — Ambulatory Visit (INDEPENDENT_AMBULATORY_CARE_PROVIDER_SITE_OTHER): Payer: Medicare HMO | Admitting: Internal Medicine

## 2017-10-23 VITALS — BP 180/90 | HR 64 | Temp 98.4°F | Resp 16 | Ht 73.0 in | Wt 194.8 lb

## 2017-10-23 DIAGNOSIS — E559 Vitamin D deficiency, unspecified: Secondary | ICD-10-CM

## 2017-10-23 DIAGNOSIS — Z23 Encounter for immunization: Secondary | ICD-10-CM

## 2017-10-23 DIAGNOSIS — Z0000 Encounter for general adult medical examination without abnormal findings: Secondary | ICD-10-CM

## 2017-10-23 DIAGNOSIS — Z794 Long term (current) use of insulin: Secondary | ICD-10-CM | POA: Diagnosis not present

## 2017-10-23 DIAGNOSIS — I10 Essential (primary) hypertension: Secondary | ICD-10-CM

## 2017-10-23 DIAGNOSIS — E785 Hyperlipidemia, unspecified: Secondary | ICD-10-CM

## 2017-10-23 DIAGNOSIS — N400 Enlarged prostate without lower urinary tract symptoms: Secondary | ICD-10-CM

## 2017-10-23 DIAGNOSIS — E1140 Type 2 diabetes mellitus with diabetic neuropathy, unspecified: Secondary | ICD-10-CM

## 2017-10-23 LAB — CBC WITH DIFFERENTIAL/PLATELET
BASOS PCT: 0.4 % (ref 0.0–3.0)
Basophils Absolute: 0 10*3/uL (ref 0.0–0.1)
EOS PCT: 1.1 % (ref 0.0–5.0)
Eosinophils Absolute: 0.1 10*3/uL (ref 0.0–0.7)
HCT: 46.1 % (ref 39.0–52.0)
Hemoglobin: 15.8 g/dL (ref 13.0–17.0)
LYMPHS ABS: 2.1 10*3/uL (ref 0.7–4.0)
Lymphocytes Relative: 28.3 % (ref 12.0–46.0)
MCHC: 34.3 g/dL (ref 30.0–36.0)
MCV: 97 fl (ref 78.0–100.0)
MONO ABS: 0.5 10*3/uL (ref 0.1–1.0)
MONOS PCT: 7.4 % (ref 3.0–12.0)
NEUTROS PCT: 62.8 % (ref 43.0–77.0)
Neutro Abs: 4.7 10*3/uL (ref 1.4–7.7)
Platelets: 169 10*3/uL (ref 150.0–400.0)
RBC: 4.76 Mil/uL (ref 4.22–5.81)
RDW: 14 % (ref 11.5–15.5)
WBC: 7.4 10*3/uL (ref 4.0–10.5)

## 2017-10-23 LAB — URINALYSIS, ROUTINE W REFLEX MICROSCOPIC
Bilirubin Urine: NEGATIVE
Hgb urine dipstick: NEGATIVE
KETONES UR: NEGATIVE
Leukocytes, UA: NEGATIVE
Nitrite: NEGATIVE
PH: 7 (ref 5.0–8.0)
RBC / HPF: NONE SEEN (ref 0–?)
TOTAL PROTEIN, URINE-UPE24: NEGATIVE
URINE GLUCOSE: NEGATIVE
UROBILINOGEN UA: 0.2 (ref 0.0–1.0)

## 2017-10-23 LAB — HEMOGLOBIN A1C: HEMOGLOBIN A1C: 5.9 % (ref 4.6–6.5)

## 2017-10-23 LAB — LIPID PANEL
CHOLESTEROL: 134 mg/dL (ref 0–200)
HDL: 39.1 mg/dL (ref >39.00)
LDL CALC: 80 mg/dL (ref 0–99)
NONHDL: 94.66
Total CHOL/HDL Ratio: 3
Triglycerides: 71 mg/dL (ref 0.0–149.0)
VLDL: 14.2 mg/dL (ref 0.0–40.0)

## 2017-10-23 LAB — VITAMIN D 25 HYDROXY (VIT D DEFICIENCY, FRACTURES): VITD: 34.66 ng/mL (ref 30.00–100.00)

## 2017-10-23 LAB — PSA: PSA: 1.02 ng/mL (ref 0.10–4.00)

## 2017-10-23 LAB — TSH: TSH: 1.74 u[IU]/mL (ref 0.35–4.50)

## 2017-10-23 MED ORDER — NEBIVOLOL HCL 20 MG PO TABS: 1.0000 | ORAL_TABLET | Freq: Every day | ORAL | 1 refills | Status: DC

## 2017-10-23 MED ORDER — OLMESARTAN MEDOXOMIL-HCTZ 40-12.5 MG PO TABS: 1.0000 | ORAL_TABLET | Freq: Every day | ORAL | 1 refills | Status: DC

## 2017-10-23 NOTE — Progress Notes (Signed)
Subjective:  Patient ID: Nicholas Whitehead, male    DOB: 04-30-1955  Age: 62 y.o. MRN: [id number]  CC: Annual Exam; Hyperlipidemia; Diabetes; COPD; and Hypertension   HPI Nicholas Whitehead presents for a CPX.  He is concerned that his blood pressure is not well controlled.  He tells me he is compliant with his current antihypertensives.  He denies any recent episodes of headache, blurred vision, chest pain, shortness of breath, edema, or fatigue.  Past Medical History:  Diagnosis Date  . Anxiety   . Cataract    right   . Depression   . Diabetes mellitus without complication ([place])   . DM (diabetes mellitus screen)   . HIV disease ([place]) 09/12/1995  . HIV infection ([place])   . Hypercholesterolemia   . Hyperlipidemia   . Hypertension    Past Surgical History:  Procedure Laterality Date  . COLONOSCOPY WITH PROPOFOL  01/16/2012   Procedure: COLONOSCOPY WITH PROPOFOL;  Surgeon: Garlan Fair, MD;  Location: [facility];  Service: Endoscopy;  Laterality: N/A;  . EYE SURGERY  2005   catarct surgery  . optic lens surgery  2005    reports that he quit smoking about 12 months ago. His smoking use included cigarettes. He smoked 0.25 packs per day. He has never used smokeless tobacco. He reports that he does not drink alcohol or use drugs. family history includes Arthritis in his father and mother; Diabetes in his father and mother; Heart disease in his father and mother; Stroke in his mother. Allergies  Allergen Reactions  . Benazepril Cough  . Pravastatin Other (See Comments)    myositis  . Glipizide Nausea Only    Past Medical History:  Diagnosis Date  . Anxiety   . Cataract    right   . Depression   . Diabetes mellitus without complication ([place])   . DM (diabetes mellitus screen)   . HIV disease ([place]) 09/12/1995  . HIV infection ([place])   . Hypercholesterolemia   . Hyperlipidemia   . Hypertension    Past Surgical History:  Procedure Laterality Date  . COLONOSCOPY WITH PROPOFOL   01/16/2012   Procedure: COLONOSCOPY WITH PROPOFOL;  Surgeon: Garlan Fair, MD;  Location: [facility];  Service: Endoscopy;  Laterality: N/A;  . EYE SURGERY  2005   catarct surgery  . optic lens surgery  2005    reports that he quit smoking about 12 months ago. His smoking use included cigarettes. He smoked 0.25 packs per day. He has never used smokeless tobacco. He reports that he does not drink alcohol or use drugs. family history includes Arthritis in his father and mother; Diabetes in his father and mother; Heart disease in his father and mother; Stroke in his mother. Allergies  Allergen Reactions  . Benazepril Cough  . Pravastatin Other (See Comments)    myositis  . Glipizide Nausea Only    Outpatient Medications Prior to Visit  Medication Sig Dispense Refill  . ACCU-CHEK FASTCLIX LANCETS MISC USE  TO CHECK BLOOD SUGAR THREE TIMES DAILY 306 each 1  . ACCU-CHEK GUIDE test strip USE TO CHECK BLOOD SUGAR 3 TIMES DAILY. 200 each 2  . albuterol (PROVENTIL HFA;VENTOLIN HFA) 108 (90 Base) MCG/ACT inhaler Inhale 2 puffs into the lungs every 6 (six) hours as needed for wheezing or shortness of breath. 1 Inhaler 5  . aspirin EC 81 MG tablet Take 81 mg by mouth every morning.    . B-D ULTRAFINE III SHORT PEN 31G X 8 MM  MISC USE AS DIRECTED FIVE TIMES A DAY 300 each 1  . Blood Glucose Monitoring Suppl (TRUE METRIX AIR GLUCOSE METER) w/Device KIT     . cetirizine (ZYRTEC) 10 MG tablet Take 10 mg by mouth daily.    . Cholecalciferol 2000 units TABS Take 1 tablet (2,000 Units total) by mouth daily. 90 tablet 1  . DULoxetine (CYMBALTA) 60 MG capsule TAKE 1 CAPSULE BY MOUTH ONCE DAILY 90 capsule 1  . emtricitabine-rilpivir-tenofovir AF (ODEFSEY) 200-25-25 MG TABS tablet Take 1 tablet by mouth daily with breakfast. 30 tablet 11  . Eszopiclone 3 MG TABS Take 1 tablet (3 mg total) by mouth at bedtime. Take immediately before bedtime 30 tablet 5  . furosemide (LASIX) 20 MG tablet Take 1 tablet (20 mg  total) by mouth daily. 30 tablet 3  . gabapentin (NEURONTIN) 300 MG capsule TAKE 1 CAPSULE BY MOUTH THREE TIMES A DAY 90 capsule 0  . Glycopyrrolate-Formoterol (BEVESPI AEROSPHERE) 9-4.8 MCG/ACT AERO Inhale 2 puffs into the lungs 2 (two) times daily. 10.7 g 11  . linaclotide (LINZESS) 72 MCG capsule Take 1 capsule (72 mcg total) by mouth daily before breakfast. 90 capsule 1  . Melatonin 3 MG TABS Take 2 tablets (6 mg total) by mouth at bedtime. 180 tablet 1  . metFORMIN (GLUCOPHAGE-XR) 500 MG 24 hr tablet 2 TABLETS BY MOUTH TWICE A DAY 360 tablet 1  . MULTIPLE VITAMIN PO Take 1 tablet by mouth.    Marland Kitchen NOVOLOG FLEXPEN 100 UNIT/ML FlexPen inject 30 units subcutaneously before meals 30 mL 1  . pioglitazone (ACTOS) 30 MG tablet TAKE 1 TABLET BY MOUTH ONCE DAILY. NEEDS APPOINTMENT FOR FURTHER REFILLS 90 tablet 0  . TOUJEO MAX SOLOSTAR 300 UNIT/ML SOPN INJECT 80 UNITS SUBCUTANEOUSLY EVERY MORNING AND 70 UNITS EVERY EVENING 10 pen 1  . trazodone (DESYREL) 300 MG tablet Take 1 tablet (300 mg total) by mouth at bedtime. 90 tablet 3  . triamcinolone cream (KENALOG) 0.5 % APPLY 1 APPLICATION TOPICALLY THREE TIME A DAY 100 g 1  . losartan (COZAAR) 100 MG tablet TAKE 1 TABLET(100 MG) BY MOUTH DAILY 90 tablet 1  . nebivolol (BYSTOLIC) 10 MG tablet Take 2 tablets (20 mg total) by mouth daily. 60 tablet 11   No facility-administered medications prior to visit.     ROS Review of Systems  Constitutional: Negative.  Negative for diaphoresis, fatigue and unexpected weight change.  HENT: Negative.  Negative for sore throat and trouble swallowing.   Eyes: Negative for visual disturbance.  Respiratory: Negative for cough, chest tightness, shortness of breath and wheezing.   Cardiovascular: Negative for chest pain, palpitations and leg swelling.  Gastrointestinal: Negative for abdominal pain, constipation, diarrhea, nausea and vomiting.  Endocrine: Negative.   Genitourinary: Negative.  Negative for difficulty  urinating.  Musculoskeletal: Negative.  Negative for back pain, myalgias and neck pain.  Skin: Negative.  Negative for color change and rash.  Neurological: Negative.  Negative for dizziness, weakness, light-headedness and headaches.  Hematological: Negative for adenopathy. Does not bruise/bleed easily.  Psychiatric/Behavioral: Positive for dysphoric mood and sleep disturbance. Negative for agitation, confusion, decreased concentration, self-injury and suicidal ideas. The patient is nervous/anxious.     Objective:  BP (!) 180/90 (BP Location: Left Arm, Patient Position: Sitting, Cuff Size: Normal)   Pulse 64   Temp 98.4 F (36.9 C) (Oral)   Resp 16   Ht _0  (1.854 m)   Wt 194 lb 12 oz (88.3 kg)   SpO2 95%  BMI 25.69 kg/m   BP Readings from Last 3 Encounters:  10/23/17 (!) 180/90  08/15/17 132/74  06/18/17 130/72    Wt Readings from Last 3 Encounters:  10/23/17 194 lb 12 oz (88.3 kg)  08/15/17 194 lb 8 oz (88.2 kg)  06/18/17 193 lb 3.2 oz (87.6 kg)    Physical Exam  Constitutional: He is oriented to person, place, and time. No distress.  HENT:  Mouth/Throat: Oropharynx is clear and moist. No oropharyngeal exudate.  Eyes: Conjunctivae are normal. No scleral icterus.  Neck: Normal range of motion. Neck supple. No JVD present. No thyromegaly present.  Cardiovascular: Normal rate, regular rhythm and normal heart sounds. Exam reveals no gallop.  No murmur heard. EKG ----  Sinus  Bradycardia  Voltage criteria for LVH  (S(V1)+R(V6) exceeds 3.50 mV)  -Voltage criteria w/o ST/T abnormality may be normal.   BORDERLINE - no change from the EKG of a year ago  Pulmonary/Chest: Effort normal and breath sounds normal. No respiratory distress. He has no wheezes. He has no rhonchi. He has no rales.  Abdominal: Soft. Bowel sounds are normal. He exhibits no mass. There is no hepatosplenomegaly. There is no tenderness. There is no rebound.  Genitourinary:  Genitourinary Comments: He  refused to undress for a GU and rectal exam  Musculoskeletal: Normal range of motion. He exhibits no edema, tenderness or deformity.  Lymphadenopathy:    He has no cervical adenopathy.  Neurological: He is alert and oriented to person, place, and time.  Skin: Skin is warm and dry. No rash noted. He is not diaphoretic.  Psychiatric: He has a normal mood and affect. His behavior is normal. Judgment and thought content normal.  Vitals reviewed.   Lab Results  Component Value Date   WBC 7.4 10/23/2017   HGB 15.8 10/23/2017   HCT 46.1 10/23/2017   PLT 169.0 10/23/2017   GLUCOSE 70 04/04/2017   CHOL 134 10/23/2017   TRIG 71.0 10/23/2017   HDL 39.10 10/23/2017   LDLCALC 80 10/23/2017   ALT 26 10/24/2016   AST 31 10/24/2016   NA 141 04/04/2017   K 3.7 04/04/2017   CL 104 04/04/2017   CREATININE 0.78 04/04/2017   BUN 6 04/04/2017   CO2 30 04/04/2017   TSH 1.74 10/23/2017   PSA 1.02 10/23/2017   HGBA1C 5.9 10/23/2017   MICROALBUR 0.8 10/24/2016    Dg Abdomen 1 View  Result Date: 09/11/2016 CLINICAL DATA:  Abdominal pain.  Constipation. EXAM: ABDOMEN - 1 VIEW COMPARISON:  None. FINDINGS: No dilated small bowel loops. Mild gas and stool in the large bowel. No evidence of pneumatosis or pneumoperitoneum. No radiopaque urolithiasis. Mild lumbar spondylosis. IMPRESSION: Nonobstructive bowel gas pattern.  Mild colonic stool burden. Electronically Signed   By: Ilona Sorrel M.D.   On: 09/11/2016 14:28    Assessment & Plan:   Nicoles was seen today for annual exam, hyperlipidemia, diabetes, copd and hypertension.  Diagnoses and all orders for this visit:  Type 2 diabetes mellitus with diabetic neuropathy, with long-term current use of insulin ([place])- His blood sugar is adequately well controlled.  He will see his endocrinologist soon to assess whether or not he should stop any of his agents for blood sugar control. -     Hemoglobin A1c; Future -     olmesartan-hydrochlorothiazide (BENICAR  HCT) 40-12.5 MG tablet; Take 1 tablet by mouth daily.  Essential hypertension- His blood pressure is not adequately well controlled.  His EKG is positive for LVH.  I have asked him to stay on the current dose of nebivolol.  I will upgrade him to a more potent ARB and will add a thiazide diuretic. -     CBC with Differential/Platelet; Future -     Urinalysis, Routine w reflex microscopic; Future -     EKG 12-Lead -     olmesartan-hydrochlorothiazide (BENICAR HCT) 40-12.5 MG tablet; Take 1 tablet by mouth daily. -     Nebivolol HCl (BYSTOLIC) 20 MG TABS; Take 1 tablet (20 mg total) by mouth daily.  Hyperlipidemia LDL goal <100- He has achieved his LDL goal and is doing well on the statin. -     Lipid panel; Future -     TSH; Future  Benign prostatic hyperplasia without lower urinary tract symptoms- His PSA is low which is reassuring that he does not have prostate cancer.  He has no symptoms that need to be treated. -     PSA; Future  Vitamin D deficiency- His vitamin D level is normal now. -     VITAMIN D 25 Hydroxy (Vit-D Deficiency, Fractures); Future  Need for influenza vaccination -     Flu Vaccine QUAD 36+ mos IM   I have discontinued Nicholas Whitehead's nebivolol and losartan. I am also having him start on olmesartan-hydrochlorothiazide and Nebivolol HCl. Additionally, I am having him maintain his cetirizine, aspirin EC, ACCU-CHEK FASTCLIX LANCETS, triamcinolone cream, MULTIPLE VITAMIN PO, albuterol, linaclotide, furosemide, ACCU-CHEK GUIDE, metFORMIN, NOVOLOG FLEXPEN, Cholecalciferol, B-D ULTRAFINE III SHORT PEN, emtricitabine-rilpivir-tenofovir AF, trazodone, Melatonin, Eszopiclone, TOUJEO MAX SOLOSTAR, TRUE METRIX AIR GLUCOSE METER, DULoxetine, Glycopyrrolate-Formoterol, pioglitazone, and gabapentin.  Meds ordered this encounter  Medications  . olmesartan-hydrochlorothiazide (BENICAR HCT) 40-12.5 MG tablet    Sig: Take 1 tablet by mouth daily.    Dispense:  90 tablet    Refill:  1    . Nebivolol HCl (BYSTOLIC) 20 MG TABS    Sig: Take 1 tablet (20 mg total) by mouth daily.    Dispense:  90 tablet    Refill:  1   See AVS for instructions about healthy living and anticipatory guidance.  Follow-up: Return in about 3 months (around 01/23/2018).  Scarlette Calico, MD

## 2017-10-23 NOTE — Patient Instructions (Signed)

## 2017-10-24 ENCOUNTER — Telehealth: Payer: Self-pay | Admitting: Internal Medicine

## 2017-10-24 NOTE — Telephone Encounter (Signed)
Pt contacted and informed of medication changes and reviewed current meds for BP that pt should take.   IE: nebivolol 20mg  with olmesartan HCTZ and forsemide. Pt is to stop taking the losartan.   Pt stated understanding and will contact his pharmacy to update their records.

## 2017-10-24 NOTE — Telephone Encounter (Signed)
Copied from [facility] [id number]. Topic: Quick Communication - Rx Refill/Question >> Oct 24, 2017 11:15 AM Oliver Pila B wrote: Medication: Nebivolol HCl (BYSTOLIC) 20 MG TABS [[id number]]   Pt called and states he is having issues w/ medication; pt didn't want to go into detail until he spoke w/ the nurse; contact to advise

## 2017-10-24 NOTE — Assessment & Plan Note (Signed)

## 2017-11-14 ENCOUNTER — Other Ambulatory Visit: Payer: Self-pay | Admitting: Endocrinology

## 2017-11-19 ENCOUNTER — Other Ambulatory Visit: Payer: Self-pay | Admitting: Endocrinology

## 2017-11-22 ENCOUNTER — Other Ambulatory Visit: Payer: Self-pay | Admitting: Endocrinology

## 2017-12-16 ENCOUNTER — Other Ambulatory Visit: Payer: Self-pay | Admitting: Endocrinology

## 2017-12-25 ENCOUNTER — Other Ambulatory Visit: Payer: Self-pay | Admitting: Endocrinology

## 2017-12-26 LAB — HM DIABETES EYE EXAM

## 2017-12-31 ENCOUNTER — Other Ambulatory Visit: Payer: Self-pay | Admitting: Internal Medicine

## 2017-12-31 DIAGNOSIS — F409 Phobic anxiety disorder, unspecified: Secondary | ICD-10-CM

## 2017-12-31 DIAGNOSIS — F5105 Insomnia due to other mental disorder: Principal | ICD-10-CM

## 2018-01-01 ENCOUNTER — Other Ambulatory Visit: Payer: Self-pay | Admitting: Internal Medicine

## 2018-01-01 DIAGNOSIS — F5105 Insomnia due to other mental disorder: Principal | ICD-10-CM

## 2018-01-01 DIAGNOSIS — F409 Phobic anxiety disorder, unspecified: Secondary | ICD-10-CM

## 2018-01-01 NOTE — Telephone Encounter (Signed)
Copied from [place] [phone]. Topic: Quick Communication - Rx Refill/Question >> Jan 01, 2018 11:05 AM Leward Quan A wrote: Medication: Eszopiclone 3 MG TABS  Patient is all out of this medication.   Has the patient contacted their pharmacy? Yes.   (Agent: If no, request that the patient contact the pharmacy for the refill.) (Agent: If yes, when and what did the pharmacy advise?)  Preferred Pharmacy (with phone number or street name): [id number] [address], [place] [phone] (Phone) [phone] (Fax)    Agent: Please be advised that RX refills may take up to 3 business days. We ask that you follow-up with your pharmacy.

## 2018-01-01 NOTE — Telephone Encounter (Signed)
Requested medication (s) are due for refill today:  Yes   Requested medication (s) are on the active medication list:  yes  Future visit scheduled:  no  Last Refill: 06/13/17; #30; RF x 5  Requested Prescriptions  Pending Prescriptions Disp Refills   Eszopiclone 3 MG TABS 30 tablet 5    Sig: Take 1 tablet (3 mg total) by mouth at bedtime. Take immediately before bedtime     Not Delegated - Psychiatry:  Anxiolytics/Hypnotics Failed - 01/01/2018 11:12 AM      Failed - This refill cannot be delegated      Failed - Urine Drug Screen completed in last 360 days.      Passed - Valid encounter within last 6 months    Recent Outpatient Visits          2 months ago Type 2 diabetes mellitus with diabetic neuropathy, with long-term current use of insulin ([place])   [place], Thomas L, MD   4 months ago Simple chronic bronchitis [facility])   [facility] -Mayer Camel, MD   6 months ago Benign prostatic hyperplasia without lower urinary tract symptoms   [place], Thomas L, MD   9 months ago Uncontrolled type 2 diabetes mellitus with hyperglycemia, with long-term current use of insulin [facility])   [facility] -Mayer Camel, MD   1 year ago Essential hypertension   [facility] -Mayer Camel, MD

## 2018-01-03 MED ORDER — ESZOPICLONE 3 MG PO TABS: 3.0000 mg | ORAL_TABLET | Freq: Every day | ORAL | 0 refills | Status: DC

## 2018-01-07 ENCOUNTER — Other Ambulatory Visit: Payer: Self-pay | Admitting: Endocrinology

## 2018-01-10 ENCOUNTER — Telehealth: Payer: Self-pay | Admitting: Internal Medicine

## 2018-01-10 NOTE — Telephone Encounter (Signed)
Copied from [place] [id number]. Topic: Quick Communication - See Telephone Encounter >> Jan 10, 2018  8:27 AM Robina Ade, Helene Kelp D wrote: CRM for notification. See Telephone encounter for: 01/10/18. Patient called requesting a call back from Dr. [name] CMA regarding to see of he would call him in something for a cold. I told pt he would need to be seen but he ask to send a message first to Dr. [name]. Please call pt back, thanks.

## 2018-01-10 NOTE — Telephone Encounter (Signed)
Pt contacted and has had the following symptoms since before Christmas: fever, chills, loss of appetite.   Pt has been scheduled with Raeford Razor on Friday at 2pm.

## 2018-01-11 ENCOUNTER — Ambulatory Visit (INDEPENDENT_AMBULATORY_CARE_PROVIDER_SITE_OTHER): Payer: Medicare HMO | Admitting: Family Medicine

## 2018-01-11 ENCOUNTER — Encounter: Payer: Self-pay | Admitting: Family Medicine

## 2018-01-11 VITALS — BP 132/76 | HR 54 | Temp 98.7°F | Resp 16 | Ht 73.0 in | Wt 180.0 lb

## 2018-01-11 DIAGNOSIS — J0110 Acute frontal sinusitis, unspecified: Secondary | ICD-10-CM | POA: Diagnosis not present

## 2018-01-11 MED ORDER — AZITHROMYCIN 250 MG PO TABS: ORAL_TABLET | ORAL | 0 refills | Status: DC

## 2018-01-11 NOTE — Assessment & Plan Note (Signed)
Has been ongoing for more than a week. Possible for viral. Less likely for flu  - counseled on supportive care - if no improvement provided azithro  - given indications to return.

## 2018-01-11 NOTE — Patient Instructions (Signed)
Nice to meet you  Please try the azithromycin if your symptom fail to improve  Please try things such as zyrtec-D or allegra-D which is an antihistamine and decongestant.  Please try afrin which will help with nasal congestion but use for only three days.  Please also try using a netti pot on a regular occasion. Please try honey, vick's vapor rub, lozenges and humidifer for cough and sore throat

## 2018-01-11 NOTE — Progress Notes (Signed)
Nicholas Whitehead - 63 y.o. male MRN [id number]  Date of birth: 12/01/1955  SUBJECTIVE:  Including CC & ROS.  No chief complaint on file.   Nicholas Whitehead is a 63 y.o. male that is presenting with congestion, sinus pressure and malaise.  Symptoms been present for over a week.  He reports having night sweats and chills.  Has not had a recorded temperature.  He feels like his symptoms are getting worse.  Has not had any rashes.  No improvement with over-the-counter medications.    Review of Systems  Constitutional: Positive for activity change.  HENT: Positive for congestion and sinus pressure.   Respiratory: Positive for cough.   Cardiovascular: Negative for chest pain.  Gastrointestinal: Negative for abdominal pain.  Skin: Negative for color change.    HISTORY: Past Medical, Surgical, Social, and Family History Reviewed & Updated per EMR.   Pertinent Historical Findings include:  Past Medical History:  Diagnosis Date  . Anxiety   . Cataract    right   . Depression   . Diabetes mellitus without complication ([place])   . DM (diabetes mellitus screen)   . HIV disease ([place]) 09/12/1995  . HIV infection ([place])   . Hypercholesterolemia   . Hyperlipidemia   . Hypertension     Past Surgical History:  Procedure Laterality Date  . COLONOSCOPY WITH PROPOFOL  01/16/2012   Procedure: COLONOSCOPY WITH PROPOFOL;  Surgeon: Garlan Fair, MD;  Location: [facility];  Service: Endoscopy;  Laterality: N/A;  . EYE SURGERY  2005   catarct surgery  . optic lens surgery  2005    Allergies  Allergen Reactions  . Benazepril Cough  . Pravastatin Other (See Comments)    myositis  . Glipizide Nausea Only    Family History  Problem Relation Age of Onset  . Arthritis Mother   . Diabetes Mother   . Heart disease Mother   . Stroke Mother   . Arthritis Father   . Diabetes Father   . Heart disease Father      Social History   Socioeconomic History  . Marital status: Single    Spouse name:  Not on file  . Number of children: Not on file  . Years of education: Not on file  . Highest education level: Not on file  Occupational History  . Not on file  Social Needs  . Financial resource strain: Not on file  . Food insecurity:    Worry: Not on file    Inability: Not on file  . Transportation needs:    Medical: Not on file    Non-medical: Not on file  Tobacco Use  . Smoking status: Former Smoker    Packs/day: 0.25    Types: Cigarettes    Last attempt to quit: 09/24/2016    Years since quitting: 1.2  . Smokeless tobacco: Never Used  Substance and Sexual Activity  . Alcohol use: No    Alcohol/week: 0.0 standard drinks    Comment: socially  . Drug use: No    Comment: rarely  . Sexual activity: Never    Birth control/protection: Other-see comments    Comment: declined condoms  Lifestyle  . Physical activity:    Days per week: Not on file    Minutes per session: Not on file  . Stress: Not on file  Relationships  . Social connections:    Talks on phone: Not on file    Gets together: Not on file    Attends religious service: Not  on file    Active member of club or organization: Not on file    Attends meetings of clubs or organizations: Not on file    Relationship status: Not on file  . Intimate partner violence:    Fear of current or ex partner: Not on file    Emotionally abused: Not on file    Physically abused: Not on file    Forced sexual activity: Not on file  Other Topics Concern  . Not on file  Social History Narrative  . Not on file     PHYSICAL EXAM:  VS: BP 132/76   Pulse (!) 54   Temp 98.7 F (37.1 C) (Oral)   Resp 16   Ht 6\' 1"  (1.854 m)   Wt 180 lb (81.6 kg)   SpO2 93%   BMI 23.75 kg/m  Physical Exam Gen: NAD, alert, cooperative with exam,  ENT: normal lips, normal nasal mucosa, tympanic membranes clear and intact bilaterally, normal oropharynx, no cervical lymphadenopathy Eye: normal EOM, normal conjunctiva and lids CV:  no edema, +2  pedal pulses, regular rhythm, S1-S2   Resp: no accessory muscle use, non-labored, clear to auscultation bilaterally, no crackles or wheezes Skin: no rashes, no areas of induration  Neuro: normal tone, normal sensation to touch Psych:  normal insight, alert and oriented MSK: Normal gait, normal strength       ASSESSMENT & PLAN:   Acute non-recurrent frontal sinusitis Has been ongoing for more than a week. Possible for viral. Less likely for flu  - counseled on supportive care - if no improvement provided azithro  - given indications to return.

## 2018-01-22 ENCOUNTER — Encounter: Payer: Self-pay | Admitting: Internal Medicine

## 2018-01-22 ENCOUNTER — Ambulatory Visit (INDEPENDENT_AMBULATORY_CARE_PROVIDER_SITE_OTHER): Payer: Medicare HMO | Admitting: Internal Medicine

## 2018-01-22 VITALS — BP 142/80 | HR 63 | Temp 98.3°F | Resp 16 | Ht 73.0 in | Wt 194.5 lb

## 2018-01-22 DIAGNOSIS — J0101 Acute recurrent maxillary sinusitis: Secondary | ICD-10-CM

## 2018-01-22 MED ORDER — AMOXICILLIN-POT CLAVULANATE 875-125 MG PO TABS: 1.0000 | ORAL_TABLET | Freq: Two times a day (BID) | ORAL | 0 refills | Status: AC

## 2018-01-22 NOTE — Patient Instructions (Signed)
Sinusitis, Adult  Sinusitis is inflammation of your sinuses. Sinuses are hollow spaces in the bones around your face. Your sinuses are located:   Around your eyes.   In the middle of your forehead.   Behind your nose.   In your cheekbones.  Mucus normally drains out of your sinuses. When your nasal tissues become inflamed or swollen, mucus can become trapped or blocked. This allows bacteria, viruses, and fungi to grow, which leads to infection. Most infections of the sinuses are caused by a virus.  Sinusitis can develop quickly. It can last for up to 4 weeks (acute) or for more than 12 weeks (chronic). Sinusitis often develops after a cold.  What are the causes?  This condition is caused by anything that creates swelling in the sinuses or stops mucus from draining. This includes:   Allergies.   Asthma.   Infection from bacteria or viruses.   Deformities or blockages in your nose or sinuses.   Abnormal growths in the nose (nasal polyps).   Pollutants, such as chemicals or irritants in the air.   Infection from fungi (rare).  What increases the risk?  You are more likely to develop this condition if you:   Have a weak body defense system (immune system).   Do a lot of swimming or diving.   Overuse nasal sprays.   Smoke.  What are the signs or symptoms?  The main symptoms of this condition are pain and a feeling of pressure around the affected sinuses. Other symptoms include:   Stuffy nose or congestion.   Thick drainage from your nose.   Swelling and warmth over the affected sinuses.   Headache.   Upper toothache.   A cough that may get worse at night.   Extra mucus that collects in the throat or the back of the nose (postnasal drip).   Decreased sense of smell and taste.   Fatigue.   A fever.   Sore throat.   Bad breath.  How is this diagnosed?  This condition is diagnosed based on:   Your symptoms.   Your medical history.   A physical exam.   Tests to find out if your condition is  acute or chronic. This may include:  ? Checking your nose for nasal polyps.  ? Viewing your sinuses using a device that has a light (endoscope).  ? Testing for allergies or bacteria.  ? Imaging tests, such as an MRI or CT scan.  In rare cases, a bone biopsy may be done to rule out more serious types of fungal sinus disease.  How is this treated?  Treatment for sinusitis depends on the cause and whether your condition is chronic or acute.   If caused by a virus, your symptoms should go away on their own within 10 days. You may be given medicines to relieve symptoms. They include:  ? Medicines that shrink swollen nasal passages (topical intranasal decongestants).  ? Medicines that treat allergies (antihistamines).  ? A spray that eases inflammation of the nostrils (topical intranasal corticosteroids).  ? Rinses that help get rid of thick mucus in your nose (nasal saline washes).   If caused by bacteria, your health care provider may recommend waiting to see if your symptoms improve. Most bacterial infections will get better without antibiotic medicine. You may be given antibiotics if you have:  ? A severe infection.  ? A weak immune system.   If caused by narrow nasal passages or nasal polyps, you may need   to have surgery.  Follow these instructions at home:  Medicines   Take, use, or apply over-the-counter and prescription medicines only as told by your health care provider. These may include nasal sprays.   If you were prescribed an antibiotic medicine, take it as told by your health care provider. Do not stop taking the antibiotic even if you start to feel better.  Hydrate and humidify     Drink enough fluid to keep your urine pale yellow. Staying hydrated will help to thin your mucus.   Use a cool mist humidifier to keep the humidity level in your home above 50%.   Inhale steam for 10-15 minutes, 3-4 times a day, or as told by your health care provider. You can do this in the bathroom while a hot shower is  running.   Limit your exposure to cool or dry air.  Rest   Rest as much as possible.   Sleep with your head raised (elevated).   Make sure you get enough sleep each night.  General instructions     Apply a warm, moist washcloth to your face 3-4 times a day or as told by your health care provider. This will help with discomfort.   Wash your hands often with soap and water to reduce your exposure to germs. If soap and water are not available, use hand sanitizer.   Do not smoke. Avoid being around people who are smoking (secondhand smoke).   Keep all follow-up visits as told by your health care provider. This is important.  Contact a health care provider if:   You have a fever.   Your symptoms get worse.   Your symptoms do not improve within 10 days.  Get help right away if:   You have a severe headache.   You have persistent vomiting.   You have severe pain or swelling around your face or eyes.   You have vision problems.   You develop confusion.   Your neck is stiff.   You have trouble breathing.  Summary   Sinusitis is soreness and inflammation of your sinuses. Sinuses are hollow spaces in the bones around your face.   This condition is caused by nasal tissues that become inflamed or swollen. The swelling traps or blocks the flow of mucus. This allows bacteria, viruses, and fungi to grow, which leads to infection.   If you were prescribed an antibiotic medicine, take it as told by your health care provider. Do not stop taking the antibiotic even if you start to feel better.   Keep all follow-up visits as told by your health care provider. This is important.  This information is not intended to replace advice given to you by your health care provider. Make sure you discuss any questions you have with your health care provider.  Document Released: 12/26/2004 Document Revised: 05/28/2017 Document Reviewed: 05/28/2017  Elsevier Interactive Patient Education  2019 [employer] Inc[employer]

## 2018-01-22 NOTE — Progress Notes (Signed)
Subjective:  Patient ID: Nicholas Whitehead, male    DOB: Jan 26, 1955  Age: 63 y.o. MRN: [id number]  CC: Sinusitis   HPI Nicholas Whitehead presents for f/up - He has felt poorly for 6 weeks with facial pain, runny nose, postnasal drip, and chills.  He saw another provider a week or 2 ago and took a course of Zithromax.  This provided minimal relief from his symptoms.  Outpatient Medications Prior to Visit  Medication Sig Dispense Refill  . ACCU-CHEK FASTCLIX LANCETS MISC USE  TO CHECK BLOOD SUGAR THREE TIMES DAILY 306 each 1  . ACCU-CHEK GUIDE test strip USE TO CHECK BLOOD SUGAR 3 TIMES DAILY. 200 each 2  . albuterol (PROVENTIL HFA;VENTOLIN HFA) 108 (90 Base) MCG/ACT inhaler Inhale 2 puffs into the lungs every 6 (six) hours as needed for wheezing or shortness of breath. 1 Inhaler 5  . aspirin EC 81 MG tablet Take 81 mg by mouth every morning.    . B-D ULTRAFINE III SHORT PEN 31G X 8 MM MISC USE AS DIRECTED FIVE TIMES A DAY 300 each 1  . Blood Glucose Monitoring Suppl (TRUE METRIX AIR GLUCOSE METER) w/Device KIT     . cetirizine (ZYRTEC) 10 MG tablet Take 10 mg by mouth daily.    . Cholecalciferol 2000 units TABS Take 1 tablet (2,000 Units total) by mouth daily. 90 tablet 1  . DULoxetine (CYMBALTA) 60 MG capsule TAKE 1 CAPSULE BY MOUTH ONCE DAILY 90 capsule 1  . emtricitabine-rilpivir-tenofovir AF (ODEFSEY) 200-25-25 MG TABS tablet Take 1 tablet by mouth daily with breakfast. 30 tablet 11  . Eszopiclone 3 MG TABS Take 1 tablet (3 mg total) by mouth at bedtime. Take immediately before bedtime 30 tablet 0  . furosemide (LASIX) 20 MG tablet TAKE 1 TABLET BY MOUTH ONCE DAILY 30 tablet 0  . gabapentin (NEURONTIN) 300 MG capsule TAKE 1 CAPSULE BY MOUTH THREE TIMES A DAY 90 capsule 0  . Glycopyrrolate-Formoterol (BEVESPI AEROSPHERE) 9-4.8 MCG/ACT AERO Inhale 2 puffs into the lungs 2 (two) times daily. 10.7 g 11  . linaclotide (LINZESS) 72 MCG capsule Take 1 capsule (72 mcg total) by mouth daily before  breakfast. 90 capsule 1  . Melatonin 3 MG TABS Take 2 tablets (6 mg total) by mouth at bedtime. 180 tablet 1  . metFORMIN (GLUCOPHAGE-XR) 500 MG 24 hr tablet TAKE 2 TABLETS BY MOUTH TWICE A DAY 360 tablet 1  . MULTIPLE VITAMIN PO Take 1 tablet by mouth.    . Nebivolol HCl (BYSTOLIC) 20 MG TABS Take 1 tablet (20 mg total) by mouth daily. 90 tablet 1  . NOVOLOG FLEXPEN 100 UNIT/ML FlexPen inject 30 units subcutaneously before meals 30 mL 1  . olmesartan-hydrochlorothiazide (BENICAR HCT) 40-12.5 MG tablet Take 1 tablet by mouth daily. 90 tablet 1  . pioglitazone (ACTOS) 30 MG tablet TAKE 1 TABLET BY MOUTH ONCE DAILY. NEEDS APPOINTMENT FOR FURTHER REFILLS 90 tablet 0  . TOUJEO MAX SOLOSTAR 300 UNIT/ML SOPN INJECT 80 UNITS UNDER THE SKIN EVERY MORNING AND 70 UNITS EVERY EVENING(PER MD NEED FOLLOW UP APPOINTMENT FOR FULL RX) 3 pen 2  . trazodone (DESYREL) 300 MG tablet Take 1 tablet (300 mg total) by mouth at bedtime. 90 tablet 3  . triamcinolone cream (KENALOG) 0.5 % APPLY 1 APPLICATION TOPICALLY THREE TIME A DAY 100 g 1  . azithromycin (ZITHROMAX) 250 MG tablet Take 500 mg the first day then 250 mg the next 4 days. Total of 5 days. 6 tablet 0  No facility-administered medications prior to visit.     ROS Review of Systems  Constitutional: Positive for chills. Negative for fatigue and fever.  HENT: Positive for postnasal drip, rhinorrhea, sinus pressure and sinus pain. Negative for congestion, facial swelling, nosebleeds, sneezing, sore throat and trouble swallowing.   Respiratory: Negative for cough, chest tightness, shortness of breath and wheezing.   Cardiovascular: Negative for chest pain, palpitations and leg swelling.  Gastrointestinal: Negative for abdominal pain, constipation, diarrhea, nausea and vomiting.  Genitourinary: Negative.  Negative for difficulty urinating.  Musculoskeletal: Negative.  Negative for arthralgias and neck pain.  Skin: Negative.  Negative for color change, pallor  and rash.  Neurological: Negative.  Negative for dizziness, weakness and headaches.  Hematological: Negative for adenopathy. Does not bruise/bleed easily.  Psychiatric/Behavioral: Negative.     Objective:  BP (!) 142/80 (BP Location: Left Arm, Patient Position: Sitting, Cuff Size: Normal)   Pulse 63   Temp 98.3 F (36.8 C) (Oral)   Resp 16   Ht _0  (1.854 m)   Wt 194 lb 8 oz (88.2 kg)   SpO2 95%   BMI 25.66 kg/m   BP Readings from Last 3 Encounters:  01/22/18 (!) 142/80  01/11/18 132/76  10/23/17 (!) 180/90    Wt Readings from Last 3 Encounters:  01/22/18 194 lb 8 oz (88.2 kg)  01/11/18 180 lb (81.6 kg)  10/23/17 194 lb 12 oz (88.3 kg)    Physical Exam Vitals signs reviewed.  Constitutional:      Appearance: He is not ill-appearing or diaphoretic.  HENT:     Nose: No mucosal edema, congestion or rhinorrhea.     Right Nostril: No epistaxis.     Left Nostril: No epistaxis.     Right Turbinates: Not enlarged or swollen.     Left Turbinates: Not enlarged or swollen.     Right Sinus: Maxillary sinus tenderness present. No frontal sinus tenderness.     Left Sinus: Maxillary sinus tenderness present. No frontal sinus tenderness.     Mouth/Throat:     Mouth: Mucous membranes are moist.     Pharynx: Oropharynx is clear. No oropharyngeal exudate or posterior oropharyngeal erythema.  Eyes:     Conjunctiva/sclera: Conjunctivae normal.  Neck:     Musculoskeletal: Normal range of motion and neck supple. No neck rigidity.  Cardiovascular:     Rate and Rhythm: Normal rate and regular rhythm.     Heart sounds: No murmur. No gallop.   Pulmonary:     Effort: Pulmonary effort is normal.     Breath sounds: No stridor. No wheezing, rhonchi or rales.  Abdominal:     General: Abdomen is flat. Bowel sounds are normal.     Palpations: There is no hepatomegaly, splenomegaly or mass.     Tenderness: There is no abdominal tenderness. There is no guarding.  Musculoskeletal: Normal  range of motion.        General: No swelling or tenderness.  Lymphadenopathy:     Cervical: No cervical adenopathy.  Skin:    General: Skin is warm and dry.     Findings: No erythema or rash.  Neurological:     General: No focal deficit present.     Mental Status: He is oriented to person, place, and time. Mental status is at baseline.     Lab Results  Component Value Date   WBC 7.4 10/23/2017   HGB 15.8 10/23/2017   HCT 46.1 10/23/2017   PLT 169.0 10/23/2017   GLUCOSE  70 04/04/2017   CHOL 134 10/23/2017   TRIG 71.0 10/23/2017   HDL 39.10 10/23/2017   LDLCALC 80 10/23/2017   ALT 26 10/24/2016   AST 31 10/24/2016   NA 141 04/04/2017   K 3.7 04/04/2017   CL 104 04/04/2017   CREATININE 0.78 04/04/2017   BUN 6 04/04/2017   CO2 30 04/04/2017   TSH 1.74 10/23/2017   PSA 1.02 10/23/2017   HGBA1C 5.9 10/23/2017   MICROALBUR 0.8 10/24/2016    Dg Abdomen 1 View  Result Date: 09/11/2016 CLINICAL DATA:  Abdominal pain.  Constipation. EXAM: ABDOMEN - 1 VIEW COMPARISON:  None. FINDINGS: No dilated small bowel loops. Mild gas and stool in the large bowel. No evidence of pneumatosis or pneumoperitoneum. No radiopaque urolithiasis. Mild lumbar spondylosis. IMPRESSION: Nonobstructive bowel gas pattern.  Mild colonic stool burden. Electronically Signed   By: Ilona Sorrel M.D.   On: 09/11/2016 14:28    Assessment & Plan:   Esiah was seen today for sinusitis.  Diagnoses and all orders for this visit:  Acute recurrent maxillary sinusitis- He has a recurrent infection that did not respond to Zithromax.  Will treat with a 2-week course of Augmentin. -     amoxicillin-clavulanate (AUGMENTIN) 875-125 MG tablet; Take 1 tablet by mouth 2 (two) times daily for 14 days.   I have discontinued Milus Jani's azithromycin. I am also having him start on amoxicillin-clavulanate. Additionally, I am having him maintain his cetirizine, aspirin EC, ACCU-CHEK FASTCLIX LANCETS, triamcinolone cream,  MULTIPLE VITAMIN PO, albuterol, linaclotide, ACCU-CHEK GUIDE, NOVOLOG FLEXPEN, Cholecalciferol, B-D ULTRAFINE III SHORT PEN, emtricitabine-rilpivir-tenofovir AF, trazodone, Melatonin, TRUE METRIX AIR GLUCOSE METER, DULoxetine, Glycopyrrolate-Formoterol, olmesartan-hydrochlorothiazide, Nebivolol HCl, gabapentin, furosemide, TOUJEO MAX SOLOSTAR, metFORMIN, Eszopiclone, and pioglitazone.  Meds ordered this encounter  Medications  . amoxicillin-clavulanate (AUGMENTIN) 875-125 MG tablet    Sig: Take 1 tablet by mouth 2 (two) times daily for 14 days.    Dispense:  28 tablet    Refill:  0     Follow-up: Return in about 3 weeks (around 02/12/2018).  Scarlette Calico, MD

## 2018-01-26 ENCOUNTER — Other Ambulatory Visit: Payer: Self-pay | Admitting: Endocrinology

## 2018-02-06 ENCOUNTER — Other Ambulatory Visit: Payer: Self-pay | Admitting: Family

## 2018-02-06 DIAGNOSIS — F5105 Insomnia due to other mental disorder: Principal | ICD-10-CM

## 2018-02-06 DIAGNOSIS — F409 Phobic anxiety disorder, unspecified: Secondary | ICD-10-CM

## 2018-02-07 MED ORDER — ESZOPICLONE 3 MG PO TABS: 3.0000 mg | ORAL_TABLET | Freq: Every day | ORAL | 1 refills | Status: DC

## 2018-02-07 NOTE — Telephone Encounter (Signed)
erx sent as requested.  

## 2018-02-12 ENCOUNTER — Other Ambulatory Visit (INDEPENDENT_AMBULATORY_CARE_PROVIDER_SITE_OTHER): Payer: Medicare HMO

## 2018-02-12 DIAGNOSIS — Z794 Long term (current) use of insulin: Secondary | ICD-10-CM | POA: Diagnosis not present

## 2018-02-12 DIAGNOSIS — E1165 Type 2 diabetes mellitus with hyperglycemia: Secondary | ICD-10-CM | POA: Diagnosis not present

## 2018-02-12 DIAGNOSIS — E1140 Type 2 diabetes mellitus with diabetic neuropathy, unspecified: Secondary | ICD-10-CM

## 2018-02-12 LAB — BASIC METABOLIC PANEL WITH GFR
BUN: 11 mg/dL (ref 6–23)
CO2: 32 meq/L (ref 19–32)
Calcium: 9.4 mg/dL (ref 8.4–10.5)
Chloride: 102 meq/L (ref 96–112)
Creatinine, Ser: 0.86 mg/dL (ref 0.40–1.50)
GFR: 109.66 mL/min
Glucose, Bld: 146 mg/dL — ABNORMAL HIGH (ref 70–99)
Potassium: 4.4 meq/L (ref 3.5–5.1)
Sodium: 140 meq/L (ref 135–145)

## 2018-02-13 ENCOUNTER — Other Ambulatory Visit: Payer: Self-pay | Admitting: Endocrinology

## 2018-02-13 DIAGNOSIS — E1140 Type 2 diabetes mellitus with diabetic neuropathy, unspecified: Secondary | ICD-10-CM | POA: Diagnosis not present

## 2018-02-13 DIAGNOSIS — Z794 Long term (current) use of insulin: Secondary | ICD-10-CM | POA: Diagnosis not present

## 2018-02-14 LAB — FRUCTOSAMINE: Fructosamine: 309 umol/L — ABNORMAL HIGH (ref 0–285)

## 2018-02-15 ENCOUNTER — Other Ambulatory Visit: Payer: Self-pay

## 2018-02-15 ENCOUNTER — Telehealth: Payer: Self-pay | Admitting: Endocrinology

## 2018-02-15 ENCOUNTER — Encounter: Payer: Self-pay | Admitting: Endocrinology

## 2018-02-15 ENCOUNTER — Ambulatory Visit (INDEPENDENT_AMBULATORY_CARE_PROVIDER_SITE_OTHER): Payer: Medicare HMO | Admitting: Endocrinology

## 2018-02-15 VITALS — BP 112/56 | HR 58 | Ht 73.0 in | Wt 193.0 lb

## 2018-02-15 DIAGNOSIS — Z794 Long term (current) use of insulin: Secondary | ICD-10-CM | POA: Diagnosis not present

## 2018-02-15 DIAGNOSIS — E1140 Type 2 diabetes mellitus with diabetic neuropathy, unspecified: Secondary | ICD-10-CM

## 2018-02-15 LAB — MICROALBUMIN / CREATININE URINE RATIO
Creatinine,U: 83.8 mg/dL
Microalb Creat Ratio: 1 mg/g (ref 0.0–30.0)
Microalb, Ur: 0.9 mg/dL (ref 0.0–1.9)

## 2018-02-15 LAB — POCT GLYCOSYLATED HEMOGLOBIN (HGB A1C): Hemoglobin A1C: 7.1 % — AB (ref 4.0–5.6)

## 2018-02-15 MED ORDER — INSULIN ASPART 100 UNIT/ML FLEXPEN: PEN_INJECTOR | SUBCUTANEOUS | 1 refills | Status: DC

## 2018-02-15 NOTE — Patient Instructions (Signed)
Reduce Toujeo to 75 with starting Novolog  Novolog 25 at meals  Check blood sugars on waking up 4 days a week  Also check blood sugars about 2 hours after meals and do this after different meals by rotation  Recommended blood sugar levels on waking up are 90-130 and about 2 hours after meal is 130-160  Please bring your blood sugar monitor to each visit, thank you

## 2018-02-15 NOTE — Telephone Encounter (Signed)
MEDICATION: Novolog  PHARMACY:  [facility] [phone]  IS THIS A 90 DAY SUPPLY :   IS PATIENT OUT OF MEDICATION:   IF NOT; HOW MUCH IS LEFT:   LAST APPOINTMENT DATE: @2 /07/2018  NEXT APPOINTMENT DATE:@5 /05/2018  DO WE HAVE YOUR PERMISSION TO LEAVE A DETAILED MESSAGE:  OTHER COMMENTS:    **Let patient know to contact pharmacy at the end of the day to make sure medication is ready. **  ** Please notify patient to allow 48-72 hours to process**  **Encourage patient to contact the pharmacy for refills or they can request refills through [facility]**

## 2018-02-15 NOTE — Telephone Encounter (Signed)
Dr. Dwyane Dee sent this Rx in for the patient during today's visit.

## 2018-02-15 NOTE — Progress Notes (Signed)
Patient ID: Nicholas Whitehead, male   DOB: 1955-09-21, 63 y.o.   MRN: [id number]   Reason for Appointment: Type II Diabetes follow-up   History of Present Illness   Diagnosis date: 1998    Previous history: He was started on oral agents at diagnosis and around 2006 was put on insulin His blood sugars are usually well controlled when he takes Actos along with his insulin. He had been on Invokana since 04/2013  He requires large doses of insulin especially basal insulin   Recent history:   Insulin regimen: Toujeo 80 units bid;  Novolog  0-30 AC tid         Oral hypoglycemic drugs: Metformin ER 500 mg twice a day, Actos 30 mg   His A1c had gone down to 5.9 in October from PCP but now 7.1  Fructosamine is 309   Current management, blood sugar patterns and problems:  He has not been seen in follow-up since 6/19 and again is irregular with his follow-up  He says that he has not had Novolog for a month or 2 and not clear why he did not ask for prescription or schedule follow-up  He is now using a [employer] meter and not the Accu-Chek, the date and time are not set and difficult to get an idea of what his blood sugars or when he is checking them  He still takes large doses of basal insulin twice a day  Does not report hypoglycemia  He thinks he is taking his Actos and metformin regularly      Side effects from medications:  urgency, frequent urination from Invokana Proper timing of medications in relation to meals: Yes.          Monitors blood glucose:  3+ times a day  Glucometer:  True Metrix now, but was on Accu-Chek  Recent range 75-186, unable to determine 30-day average        Meals: Breakfast variable, 9-10 AM Lunch 2-3pm  usually avoiding high-fat meals, dinner at about 6 pm         Physical activity: exercise: Walking 2/7  Dietician visit: Most recent: 2013    Weight control:  Wt Readings from Last 3 Encounters:  02/15/18 193 lb (87.5 kg)  01/22/18 194 lb  8 oz (88.2 kg)  01/11/18 180 lb (81.6 kg)          Diabetes labs:  Lab Results  Component Value Date   HGBA1C 7.1 (A) 02/15/2018   HGBA1C 5.9 10/23/2017   HGBA1C 6.4 04/04/2017   Lab Results  Component Value Date   MICROALBUR 0.8 10/24/2016   LDLCALC 80 10/23/2017   CREATININE 0.86 02/12/2018   Lab Results  Component Value Date   FRUCTOSAMINE 309 (H) 02/13/2018   FRUCTOSAMINE 270 06/13/2017   FRUCTOSAMINE 353 (H) 11/10/2016   FRUCTOSAMINE 293 (H) 06/22/2016   FRUCTOSAMINE 271 10/25/2015    Other active problems discussed today: See review of systems   Office Visit on 02/15/2018  Component Date Value Ref Range Status  . Hemoglobin A1C 02/15/2018 7.1* 4.0 - 5.6 % Final  Lab on 02/12/2018  Component Date Value Ref Range Status  . Sodium 02/12/2018 140  135 - 145 mEq/L Final  . Potassium 02/12/2018 4.4  3.5 - 5.1 mEq/L Final  . Chloride 02/12/2018 102  96 - 112 mEq/L Final  . CO2 02/12/2018 32  19 - 32 mEq/L Final  . Glucose, Bld 02/12/2018 146* 70 - 99 mg/dL Final  . BUN  02/12/2018 11  6 - 23 mg/dL Final  . Creatinine, Ser 02/12/2018 0.86  0.40 - 1.50 mg/dL Final  . Calcium 02/12/2018 9.4  8.4 - 10.5 mg/dL Final  . GFR 02/12/2018 109.66  >60.00 mL/min Final  . Fructosamine 02/13/2018 309* 0 - 285 umol/L Final   Comment: Published reference interval for apparently healthy subjects between age 10 and 59 is 53 - 285 umol/L and in a poorly controlled diabetic population is 228 - 563 umol/L with a mean of 396 umol/L.      Allergies as of 02/15/2018      Reactions   Benazepril Cough   Pravastatin Other (See Comments)   myositis   Glipizide Nausea Only      Medication List       Accurate as of February 15, 2018 10:30 AM. Always use your most recent med list.        ACCU-CHEK FASTCLIX LANCETS Misc USE  TO CHECK BLOOD SUGAR THREE TIMES DAILY   ACCU-CHEK GUIDE test strip Generic drug:  glucose blood USE TO CHECK BLOOD SUGAR 3 TIMES DAILY.   albuterol 108  (90 Base) MCG/ACT inhaler Commonly known as:  PROVENTIL HFA;VENTOLIN HFA Inhale 2 puffs into the lungs every 6 (six) hours as needed for wheezing or shortness of breath.   aspirin EC 81 MG tablet Take 81 mg by mouth every morning.   B-D ULTRAFINE III SHORT PEN 31G X 8 MM Misc Generic drug:  Insulin Pen Needle USE AS DIRECTED FIVE TIMES A DAY   cetirizine 10 MG tablet Commonly known as:  ZYRTEC Take 10 mg by mouth daily.   Cholecalciferol 50 MCG (2000 UT) Tabs Take 1 tablet (2,000 Units total) by mouth daily.   DULoxetine 60 MG capsule Commonly known as:  CYMBALTA TAKE 1 CAPSULE BY MOUTH ONCE DAILY   emtricitabine-rilpivir-tenofovir AF 200-25-25 MG Tabs tablet Commonly known as:  ODEFSEY Take 1 tablet by mouth daily with breakfast.   Eszopiclone 3 MG Tabs Take 1 tablet (3 mg total) by mouth at bedtime.   furosemide 20 MG tablet Commonly known as:  LASIX TAKE 1 TABLET BY MOUTH ONCE DAILY   gabapentin 300 MG capsule Commonly known as:  NEURONTIN TAKE 1 CAPSULE BY MOUTH THREE TIMES A DAY   Glycopyrrolate-Formoterol 9-4.8 MCG/ACT Aero Commonly known as:  BEVESPI AEROSPHERE Inhale 2 puffs into the lungs 2 (two) times daily.   linaclotide 72 MCG capsule Commonly known as:  LINZESS Take 1 capsule (72 mcg total) by mouth daily before breakfast.   Melatonin 3 MG Tabs Take 2 tablets (6 mg total) by mouth at bedtime.   metFORMIN 500 MG 24 hr tablet Commonly known as:  GLUCOPHAGE-XR TAKE 2 TABLETS BY MOUTH TWICE A DAY   MULTIPLE VITAMIN PO Take 1 tablet by mouth.   Nebivolol HCl 20 MG Tabs Commonly known as:  BYSTOLIC Take 1 tablet (20 mg total) by mouth daily.   olmesartan-hydrochlorothiazide 40-12.5 MG tablet Commonly known as:  BENICAR HCT Take 1 tablet by mouth daily.   pioglitazone 30 MG tablet Commonly known as:  ACTOS TAKE 1 TABLET BY MOUTH ONCE DAILY. NEEDS APPOINTMENT FOR FURTHER REFILLS   TOUJEO MAX SOLOSTAR 300 UNIT/ML Sopn Generic drug:  Insulin  Glargine (2 Unit Dial) INJECT 80 UNITS UNDER THE SKIN EVERY MORNING AND 70 UNITS EVERY EVENING(PER MD NEED FOLLOW UP APPOINTMENT FOR FULL RX)   trazodone 300 MG tablet Commonly known as:  DESYREL Take 1 tablet (300 mg total) by mouth at bedtime.  triamcinolone cream 0.5 % Commonly known as:  KENALOG APPLY 1 APPLICATION TOPICALLY THREE TIME A DAY   TRUE METRIX AIR GLUCOSE METER w/Device Kit       Allergies:  Allergies  Allergen Reactions  . Benazepril Cough  . Pravastatin Other (See Comments)    myositis  . Glipizide Nausea Only    Past Medical History:  Diagnosis Date  . Anxiety   . Cataract    right   . Depression   . Diabetes mellitus without complication ([place])   . DM (diabetes mellitus screen)   . HIV disease ([place]) 09/12/1995  . HIV infection ([place])   . Hypercholesterolemia   . Hyperlipidemia   . Hypertension     Past Surgical History:  Procedure Laterality Date  . COLONOSCOPY WITH PROPOFOL  01/16/2012   Procedure: COLONOSCOPY WITH PROPOFOL;  Surgeon: Garlan Fair, MD;  Location: [facility];  Service: Endoscopy;  Laterality: N/A;  . EYE SURGERY  2005   catarct surgery  . optic lens surgery  2005    Family History  Problem Relation Age of Onset  . Arthritis Mother   . Diabetes Mother   . Heart disease Mother   . Stroke Mother   . Arthritis Father   . Diabetes Father   . Heart disease Father     Social History:  reports that he quit smoking about 16 months ago. His smoking use included cigarettes. He smoked 0.25 packs per day. He has never used smokeless tobacco. He reports that he does not drink alcohol or use drugs.  Review of Systems:   Hypertension:  currently on Bystolic  and on losartan 100 mg  tablet daily, followed by PCP Also on Lasix as needed  BP Readings from Last 3 Encounters:  02/15/18 (!) 112/56  01/22/18 (!) 142/80  01/11/18 132/76      Lipids: LDL is normal as of 10/2017 followed by PCP   Lab Results  Component Value  Date   CHOL 134 10/23/2017   HDL 39.10 10/23/2017   LDLCALC 80 10/23/2017   TRIG 71.0 10/23/2017   CHOLHDL 3 10/23/2017    He has had symptoms of neuropathy with paresthesia in feet and legs, these are controlled with gabapentin 300 mg 3 times a day      LABS:  Office Visit on 02/15/2018  Component Date Value Ref Range Status  . Hemoglobin A1C 02/15/2018 7.1* 4.0 - 5.6 % Final  Lab on 02/12/2018  Component Date Value Ref Range Status  . Sodium 02/12/2018 140  135 - 145 mEq/L Final  . Potassium 02/12/2018 4.4  3.5 - 5.1 mEq/L Final  . Chloride 02/12/2018 102  96 - 112 mEq/L Final  . CO2 02/12/2018 32  19 - 32 mEq/L Final  . Glucose, Bld 02/12/2018 146* 70 - 99 mg/dL Final  . BUN 02/12/2018 11  6 - 23 mg/dL Final  . Creatinine, Ser 02/12/2018 0.86  0.40 - 1.50 mg/dL Final  . Calcium 02/12/2018 9.4  8.4 - 10.5 mg/dL Final  . GFR 02/12/2018 109.66  >60.00 mL/min Final  . Fructosamine 02/13/2018 309* 0 - 285 umol/L Final   Comment: Published reference interval for apparently healthy subjects between age 21 and 80 is 48 - 285 umol/L and in a poorly controlled diabetic population is 228 - 563 umol/L with a mean of 396 umol/L.      Examination:   BP (!) 112/56 (BP Location: Left Arm, Patient Position: Sitting, Cuff Size: Normal)   Pulse Marland Kitchen)  58   Ht 6' 1"  (1.854 m)   Wt 193 lb (87.5 kg)   SpO2 94%   BMI 25.46 kg/m   Body mass index is 25.46 kg/m.     ASSESSMENT/ PLAN:    Diabetes type 2:  See history of present illness for detailed discussion of his current management, blood sugar patterns and problems identified  His A1c is back up to 7.1 even though it has been as low as 5.9 Also his fructosamine indicates inadequate control  He has not been following up regularly and not taking his NovoLog With this is generic monitor difficult to know what his blood sugar patterns are  Although he thinks he is doing better with cutting back on meat and fatty foods she is still  not losing much weight Still taking large doses of basal insulin  Recommendations would be to start back on NovoLog but for now take 25 units 3 times daily He will reduce his Toujeo to 75 since he is starting mealtime insulin More regular follow-up recommended He will be given an Accu-Chek meter to replace his insurance company promoted meter  Hypertension  blood pressure is controlled Need to check urine microalbumin  Patient Instructions  Reduce Toujeo to 75 with starting Novolog  Novolog 25 at meals  Check blood sugars on waking up 4 days a week  Also check blood sugars about 2 hours after meals and do this after different meals by rotation  Recommended blood sugar levels on waking up are 90-130 and about 2 hours after meal is 130-160  Please bring your blood sugar monitor to each visit, thank you           Elayne Snare 02/15/2018, 10:30 AM         ..

## 2018-02-19 ENCOUNTER — Ambulatory Visit: Payer: Medicare HMO | Admitting: Internal Medicine

## 2018-02-20 ENCOUNTER — Ambulatory Visit (INDEPENDENT_AMBULATORY_CARE_PROVIDER_SITE_OTHER): Payer: Medicare HMO | Admitting: Internal Medicine

## 2018-02-20 ENCOUNTER — Encounter: Payer: Self-pay | Admitting: Internal Medicine

## 2018-02-20 VITALS — BP 156/82 | HR 52 | Temp 98.2°F | Resp 16 | Ht 73.0 in | Wt 190.0 lb

## 2018-02-20 DIAGNOSIS — Z794 Long term (current) use of insulin: Secondary | ICD-10-CM | POA: Diagnosis not present

## 2018-02-20 DIAGNOSIS — I10 Essential (primary) hypertension: Secondary | ICD-10-CM | POA: Diagnosis not present

## 2018-02-20 DIAGNOSIS — E1140 Type 2 diabetes mellitus with diabetic neuropathy, unspecified: Secondary | ICD-10-CM

## 2018-02-20 MED ORDER — FUROSEMIDE 20 MG PO TABS: 20.0000 mg | ORAL_TABLET | Freq: Two times a day (BID) | ORAL | 1 refills | Status: DC

## 2018-02-20 NOTE — Progress Notes (Signed)
Subjective:  Patient ID: Nicholas Whitehead, male    DOB: 14-May-1955  Age: 63 y.o. MRN: [id number]  CC: Hypertension   HPI Nicholas Whitehead presents for a BP check - He ran out of lasix a few weeks ago and since his BP has not been well controlled.  Outpatient Medications Prior to Visit  Medication Sig Dispense Refill  . ACCU-CHEK FASTCLIX LANCETS MISC USE  TO CHECK BLOOD SUGAR THREE TIMES DAILY 306 each 1  . ACCU-CHEK GUIDE test strip USE TO CHECK BLOOD SUGAR 3 TIMES DAILY. 200 each 2  . albuterol (PROVENTIL HFA;VENTOLIN HFA) 108 (90 Base) MCG/ACT inhaler Inhale 2 puffs into the lungs every 6 (six) hours as needed for wheezing or shortness of breath. 1 Inhaler 5  . aspirin EC 81 MG tablet Take 81 mg by mouth every morning.    . B-D ULTRAFINE III SHORT PEN 31G X 8 MM MISC USE AS DIRECTED FIVE TIMES A DAY 300 each 1  . Blood Glucose Monitoring Suppl (TRUE METRIX AIR GLUCOSE METER) w/Device KIT     . cetirizine (ZYRTEC) 10 MG tablet Take 10 mg by mouth daily.    . Cholecalciferol 2000 units TABS Take 1 tablet (2,000 Units total) by mouth daily. 90 tablet 1  . DULoxetine (CYMBALTA) 60 MG capsule TAKE 1 CAPSULE BY MOUTH ONCE DAILY 90 capsule 1  . emtricitabine-rilpivir-tenofovir AF (ODEFSEY) 200-25-25 MG TABS tablet Take 1 tablet by mouth daily with breakfast. 30 tablet 11  . Eszopiclone 3 MG TABS Take 1 tablet (3 mg total) by mouth at bedtime. 90 tablet 1  . gabapentin (NEURONTIN) 300 MG capsule TAKE 1 CAPSULE BY MOUTH THREE TIMES A DAY 90 capsule 0  . Glycopyrrolate-Formoterol (BEVESPI AEROSPHERE) 9-4.8 MCG/ACT AERO Inhale 2 puffs into the lungs 2 (two) times daily. 10.7 g 11  . insulin aspart (NOVOLOG FLEXPEN) 100 UNIT/ML FlexPen 25 units before each meal 30 mL 1  . linaclotide (LINZESS) 72 MCG capsule Take 1 capsule (72 mcg total) by mouth daily before breakfast. 90 capsule 1  . Melatonin 3 MG TABS Take 2 tablets (6 mg total) by mouth at bedtime. 180 tablet 1  . metFORMIN (GLUCOPHAGE-XR) 500 MG  24 hr tablet TAKE 2 TABLETS BY MOUTH TWICE A DAY 360 tablet 1  . MULTIPLE VITAMIN PO Take 1 tablet by mouth.    . Nebivolol HCl (BYSTOLIC) 20 MG TABS Take 1 tablet (20 mg total) by mouth daily. 90 tablet 1  . olmesartan-hydrochlorothiazide (BENICAR HCT) 40-12.5 MG tablet Take 1 tablet by mouth daily. 90 tablet 1  . pioglitazone (ACTOS) 30 MG tablet TAKE 1 TABLET BY MOUTH ONCE DAILY. NEEDS APPOINTMENT FOR FURTHER REFILLS 90 tablet 0  . TOUJEO MAX SOLOSTAR 300 UNIT/ML SOPN INJECT 80 UNITS UNDER THE SKIN EVERY MORNING AND 70 UNITS EVERY EVENING(PER MD NEED FOLLOW UP APPOINTMENT FOR FULL RX) (Patient taking differently: Inject 80 Units into the skin 2 (two) times daily. INJECT 80 UNITS UNDER THE SKIN TWICE DAILY.) 3 pen 2  . trazodone (DESYREL) 300 MG tablet Take 1 tablet (300 mg total) by mouth at bedtime. 90 tablet 3  . triamcinolone cream (KENALOG) 0.5 % APPLY 1 APPLICATION TOPICALLY THREE TIME A DAY 100 g 1  . furosemide (LASIX) 20 MG tablet TAKE 1 TABLET BY MOUTH ONCE DAILY 30 tablet 0   No facility-administered medications prior to visit.     ROS Review of Systems  Constitutional: Negative for diaphoresis and fatigue.  HENT: Negative.   Eyes: Negative for  visual disturbance.  Respiratory: Negative for cough, chest tightness, shortness of breath and wheezing.   Cardiovascular: Positive for leg swelling. Negative for chest pain and palpitations.  Gastrointestinal: Negative for abdominal pain, constipation, diarrhea, nausea and vomiting.  Genitourinary: Negative.  Negative for difficulty urinating and urgency.  Musculoskeletal: Negative.  Negative for arthralgias and myalgias.  Skin: Negative.  Negative for color change.  Neurological: Negative.  Negative for dizziness, weakness, light-headedness and numbness.  Hematological: Negative for adenopathy. Does not bruise/bleed easily.  Psychiatric/Behavioral: Negative.     Objective:  BP (!) 156/82 (BP Location: Left Arm, Patient Position:  Sitting, Cuff Size: Normal)   Pulse (!) 52   Temp 98.2 F (36.8 C) (Oral)   Resp 16   Ht _0  (1.854 m)   Wt 190 lb (86.2 kg)   SpO2 96%   BMI 25.07 kg/m   BP Readings from Last 3 Encounters:  02/20/18 (!) 156/82  02/15/18 (!) 112/56  01/22/18 (!) 142/80    Wt Readings from Last 3 Encounters:  02/20/18 190 lb (86.2 kg)  02/15/18 193 lb (87.5 kg)  01/22/18 194 lb 8 oz (88.2 kg)    Physical Exam Vitals signs reviewed.  Constitutional:      Appearance: He is not ill-appearing or diaphoretic.  HENT:     Nose: Nose normal. No congestion or rhinorrhea.     Mouth/Throat:     Pharynx: Oropharynx is clear. No oropharyngeal exudate or posterior oropharyngeal erythema.  Eyes:     General: No scleral icterus.    Conjunctiva/sclera: Conjunctivae normal.  Neck:     Musculoskeletal: Normal range of motion and neck supple. No muscular tenderness.  Cardiovascular:     Rate and Rhythm: Normal rate and regular rhythm.     Heart sounds: No murmur. No gallop.   Pulmonary:     Effort: Pulmonary effort is normal. No respiratory distress.     Breath sounds: No stridor. No wheezing, rhonchi or rales.  Abdominal:     General: Abdomen is flat.     Palpations: There is no hepatomegaly, splenomegaly or mass.     Tenderness: There is no abdominal tenderness.  Musculoskeletal:        General: No swelling or tenderness.     Right lower leg: Edema (trace pitting) present.     Left lower leg: Edema (trace pitting) present.  Lymphadenopathy:     Cervical: No cervical adenopathy.  Skin:    General: Skin is warm and dry.     Coloration: Skin is not pale.  Neurological:     General: No focal deficit present.     Mental Status: He is oriented to person, place, and time. Mental status is at baseline.     Lab Results  Component Value Date   WBC 7.4 10/23/2017   HGB 15.8 10/23/2017   HCT 46.1 10/23/2017   PLT 169.0 10/23/2017   GLUCOSE 146 (H) 02/12/2018   CHOL 134 10/23/2017   TRIG 71.0  10/23/2017   HDL 39.10 10/23/2017   LDLCALC 80 10/23/2017   ALT 26 10/24/2016   AST 31 10/24/2016   NA 140 02/12/2018   K 4.4 02/12/2018   CL 102 02/12/2018   CREATININE 0.86 02/12/2018   BUN 11 02/12/2018   CO2 32 02/12/2018   TSH 1.74 10/23/2017   PSA 1.02 10/23/2017   HGBA1C 7.1 (A) 02/15/2018   MICROALBUR 0.9 02/15/2018    Dg Abdomen 1 View  Result Date: 09/11/2016 CLINICAL DATA:  Abdominal pain.  Constipation. EXAM: ABDOMEN - 1 VIEW COMPARISON:  None. FINDINGS: No dilated small bowel loops. Mild gas and stool in the large bowel. No evidence of pneumatosis or pneumoperitoneum. No radiopaque urolithiasis. Mild lumbar spondylosis. IMPRESSION: Nonobstructive bowel gas pattern.  Mild colonic stool burden. Electronically Signed   By: Ilona Sorrel M.D.   On: 09/11/2016 14:28    Assessment & Plan:   Reshard was seen today for hypertension.  Diagnoses and all orders for this visit:  Type 2 diabetes mellitus with diabetic neuropathy, with long-term current use of insulin ([place])- His recant A1C was 7.1%, his blood sugars are adequately well controlled. -     AMB Referral to [place] [facility]  Essential hypertension- His BP is not adequately well controlled. Will restart the loop diuretic. -     furosemide (LASIX) 20 MG tablet; Take 1 tablet (20 mg total) by mouth 2 (two) times daily.   I have changed Nicholas Whitehead's furosemide. I am also having him maintain his cetirizine, aspirin EC, ACCU-CHEK FASTCLIX LANCETS, triamcinolone cream, MULTIPLE VITAMIN PO, albuterol, linaclotide, ACCU-CHEK GUIDE, Cholecalciferol, B-D ULTRAFINE III SHORT PEN, emtricitabine-rilpivir-tenofovir AF, trazodone, Melatonin, TRUE METRIX AIR GLUCOSE METER, DULoxetine, Glycopyrrolate-Formoterol, olmesartan-hydrochlorothiazide, Nebivolol HCl, gabapentin, TOUJEO MAX SOLOSTAR, metFORMIN, pioglitazone, Eszopiclone, and insulin aspart.  Meds ordered this encounter  Medications  . furosemide (LASIX) 20 MG tablet    Sig:  Take 1 tablet (20 mg total) by mouth 2 (two) times daily.    Dispense:  180 tablet    Refill:  1     Follow-up: Return in about 4 months (around 06/21/2018).  Scarlette Calico, MD

## 2018-02-20 NOTE — Patient Instructions (Signed)

## 2018-02-21 ENCOUNTER — Other Ambulatory Visit: Payer: Self-pay | Admitting: *Deleted

## 2018-02-21 NOTE — Patient Outreach (Signed)
[facility]) Care Management  02/21/2018  Garnet Overfield 1955/06/23 [id number]   MD referral Screen  Referral Date: 02/21/2018 Referral Source: MD referral Dr Ria Bush primary care Referral Reason: Medication cost Diagnoses of   Diabetes   Insurance: [facility] medicare  Seen by Dr [name] on 02/20/2018  Mr Rocca was previously active with [facility] for health coach, pharmacy and SW services from August 2019 to 03/2017   Outreach attempt # 1 No answer. [facility] RN CM left HIPAA compliant voicemail message along with CM's contact info.   Plan: [facility] RN CM sent an unsuccessful outreach letter and scheduled this patient for another call attempt within 4 business days   Kimberly L. Lavina Hamman, RN, BSN, [place] Coordinator Office number [phone] Mobile number [phone]  Main [facility] number [phone] Fax number [phone]

## 2018-02-25 ENCOUNTER — Telehealth: Payer: Self-pay | Admitting: Internal Medicine

## 2018-02-25 ENCOUNTER — Other Ambulatory Visit: Payer: Self-pay | Admitting: Endocrinology

## 2018-02-25 ENCOUNTER — Other Ambulatory Visit: Payer: Self-pay | Admitting: *Deleted

## 2018-02-25 NOTE — Patient Outreach (Signed)
[facility]) Care Management  02/25/2018  Nicholas Whitehead 04-21-1955 [id number]   MD referralScreen  Referral Date:02/21/2018 Referral Source:MD referral Dr Ria Bush primary care Referral Reason:Medication costDiagnoses ofDiabetes Insurance:[employer] medicare  Seen by Dr [name] on 02/20/2018  Nicholas Whitehead was previously active with [facility] for health coach, pharmacy and SW services from August 2019 to 03/2017  Patient returned a call to [facility] RN CM Patient is able to verify HIPAA Reviewed and addressed EMMI red alert/referral to [facility] with patient  Nicholas Whitehead confirms he is having difficult with paying for his DM medicines, accu chek test strips and having questions about medications to assist with his insomnia  [facility] RN CM called Dr [name] office spoke with [place] Sanes to discuss Nicholas Whitehead's concern with getting more Accu chek guide test strips related to the cost -[place] Sanes will update Dr [name] and have him to advise pt or CM    Social: Nicholas Whitehead is a single disabled male who has not worked in a while.  He is able to get out of his home and reports he can call a friend to assist prn. He reports being independent with his care needs. He reports his sister lives in [place]. He is a seven day [place]  He reports he does not have Internet access but has access to his phone and television for communication with others  During the call he reports not socializing well because of his age and medical diagnoses. He voices concern with not having energy at times to clean his home He expresses he does not like to be "rushed to do anything" He discussed with CM he would like to speak with some other people with similar illnesses at times    Conditions: HTN, DM type 2, HIV, insomnia, BPH, HDL, DJD of shoulder, chronic constipation, hx of bronchitis, hx of sinusitis, anxiety and depression, tobacco abuse, vitamin D deficiency   Medications: Nicholas Whitehead reports he has not changed  insurance but as on 2020 he has had to borrow money to get his medications/pay his co pays  He believes his issues with cost stem from having to meet a deductible upfront which he reports is not the norm.  He continues to prefer to use [facility] on [address] and does not use delivery He has not had assist from any SSI supplement program but Dr [name] has offered samples to him in the past   DM Toujeo and Insulins are the medications he is having cost concerns with  He reports also he was using a true Metrix air meter but recently Dr [name] gave him an accu chek guide to use related to needing to monitoring his cbg values "on the computer" He reports Dr [name] takes his "meter, put it on a computer and track my up and downs for 3 months. They make a graph"  Dr [name] only provided 10 test strips with the new meter  Nicholas [name] can not afford to purchase more   Insomnia- He reports he was offered a "sleeping cocktail" He states it helps sometimes but still he wakes up during the night and makes him tired during the day  Depression Took Cymbalta too strong caused chronic constipation He reports he never wants to take Cymbalta again   Appointments: He was last seen by Dr [name] on 2/12/202 March 2020 to see [facility] May 2020 to be seen by Dr Dwyane Dee endocrinologist  Advance Directives: Denies need for assist with advance directives     Consent:  [facility] RN CM reviewed [facility] services with patient. Patient gave verbal consent for services for telephonic RN CM, [place], [facility] pharmacy.   [facility]ans [facility] RN CM will refer Nicholas Whitehead to [place] for this MD referral for assistance with the cost/management for, Linzess, Toujeo and his Insulins He is also having issues with the cost of Accu chek guide test strips He also has polypharmacy  He also had questions about medications to assist with insomnia that does not have strong side effects [facility] RN CM will refer Nicholas Whitehead to [facility] SW to assist with resources for  socialization, possible community resources, local support group or telephonic support group with similar diagnoses  and related to his PHQ -9 score of 5   Madgie Dhaliwal L. Lavina Hamman, RN, BSN, [place] Coordinator Office number [phone] Mobile number [phone]  Main [facility] number [phone] Fax number [phone]

## 2018-02-25 NOTE — Patient Outreach (Signed)
Nicholas Whitehead) Care Management  02/25/2018  Nicholas Whitehead [birth year] [id number]   MD referral Screen  Referral Date: 02/21/2018 Referral Source: MD referral Dr Ria Bush primary care Referral Reason: Medication costDiagnoses ofDiabetes Insurance: [facility]  Seen by Dr [name] on 02/20/2018  Nicholas Whitehead was previously active with [facility] for health coach, pharmacy and SW services from August 2019 to 03/2017   Outreach attempt # 2 No answer. [facility] RN CM left HIPAA compliant voicemail message along with CM's contact info.   Plan: [facility] RN CM scheduled this patient for another call attempt within 4 business days   Nicholas Whitehead L. Lavina Hamman, RN, BSN, [place] Coordinator Office number [phone] Mobile number [phone]  Main [facility] number [phone] Fax number [phone]

## 2018-02-25 NOTE — Telephone Encounter (Signed)
Copied from [place] [phone]. Topic: Quick Communication - Rx Refill/Question >> Feb 25, 2018  4:51 PM Alanda Slim E wrote: Medication: ACCU-CHEK GUIDE test strip  - Pt is having a difficult time wit the cost of these test strips and Jackelyn Poling from [facility] called to see if there is any other alternatives or if Pt can go back to the Blood Glucose Monitoring Suppl (TRUE METRIX AIR GLUCOSE METER) w/Device KIT which he had no issue with in cost. They want to know if samples can be given or what advise Dr. [name] can give to assist the Pt with this issue so that he doesn't make the decision to not check his sugar at all. Derrek Monaco advise

## 2018-02-25 NOTE — Addendum Note (Signed)
Addended by: Barbaraann Faster on: 02/25/2018 04:57 PM   Modules accepted: Orders

## 2018-02-26 ENCOUNTER — Other Ambulatory Visit: Payer: Self-pay | Admitting: Pharmacist

## 2018-02-26 NOTE — Patient Outreach (Signed)
[facility] [facility]) Care Management  [place]   02/26/2018  Nicholas Whitehead 09-06-1955 [id number]  Reason for referral: Medication Assistance with insulins, linzess, and diabetic testing supplies, Medication Management   Referral source: Dr. Scarlette Calico Current insurance:[employer]  PMHx includes but not limited to:  T2DM with neuropathy, HTN, HLD, HIV, anxiety / depression, chronic constipation, mild cognitive impairment, insomnia, tobacco abuse, BPH  Unsuccessful telephone call attempt #1 to patient. HIPAA compliant voicemail left requesting a return call  Plan:  Noted that another [facility] discipline has recently mailed patient an unsuccessful outreach letter, I will make another outreach attempt to patient within 3-4 business days   Ralene Bathe, PharmD, [place] [phone]

## 2018-02-27 ENCOUNTER — Telehealth: Payer: Self-pay

## 2018-02-27 ENCOUNTER — Ambulatory Visit: Payer: Self-pay | Admitting: *Deleted

## 2018-02-27 MED ORDER — GLUCOSE BLOOD VI STRP: ORAL_STRIP | 3 refills | Status: DC

## 2018-02-27 NOTE — Telephone Encounter (Signed)
erx sent and pt informed of the same.

## 2018-02-27 NOTE — Telephone Encounter (Signed)
Copied from [place] [phone]. Topic: Quick Communication - Rx Refill/Question >> Feb 25, 2018  4:51 PM Alanda Slim E wrote: Medication: ACCU-CHEK GUIDE test strip  - Pt is having a difficult time wit the cost of these test strips and Jackelyn Poling from [facility] called to see if there is any other alternatives or if Pt can go back to the Blood Glucose Monitoring Suppl (TRUE METRIX AIR GLUCOSE METER) w/Device KIT which he had no issue with in cost. They want to know if samples can be given or what advise Dr. [name] can give to assist the Pt with this issue so that he doesn't make the decision to not check his sugar at all. Derrek Monaco advise

## 2018-03-01 ENCOUNTER — Other Ambulatory Visit: Payer: Self-pay | Admitting: Pharmacist

## 2018-03-01 ENCOUNTER — Ambulatory Visit: Payer: Self-pay | Admitting: Pharmacist

## 2018-03-01 MED ORDER — LANCETS MISC: 3 refills | Status: DC

## 2018-03-01 NOTE — Addendum Note (Signed)
Addended by: Karle Barr on: 03/01/2018 03:17 PM   Modules accepted: Orders

## 2018-03-01 NOTE — Patient Outreach (Signed)
[place] [facility]) Care Management  [place]   03/01/2018  Nicholas Whitehead [birth year] [id number]  Reason for referral: Medication Assistance with insulins, linzess, and diabetic testing supplies, Medication Management   Referral source: Dr. Scarlette Calico Current insurance:[employer]  PMHx includes but not limited to:  T2DM with neuropathy, HTN, HLD, HIV, anxiety / depression, chronic constipation, mild cognitive impairment, insomnia, tobacco abuse, BPH  Outreach:  Successful telephone call with Nicholas Whitehead.  HIPAA identifiers verified. Patient reports he is making breakfast and will call me back in 15 minutes.   Subjective:  Patient reports he recently was given an Accu-Check Guide meter by PCP but ran out of test strips.  He reports he thinks a [facility] but has not called to check on this yet.    Patient states he needs help on copays for all his medications.  He states he is "bending pennies" to make ends meet.    Objective: Lab Results  Component Value Date   CREATININE 0.86 02/12/2018   CREATININE 0.78 04/04/2017   CREATININE 0.82 12/26/2016    Lab Results  Component Value Date   HGBA1C 7.1 (A) 02/15/2018    Lipid Panel     Component Value Date/Time   CHOL 134 10/23/2017 1033   TRIG 71.0 10/23/2017 1033   HDL 39.10 10/23/2017 1033   CHOLHDL 3 10/23/2017 1033   VLDL 14.2 10/23/2017 1033   LDLCALC 80 10/23/2017 1033    BP Readings from Last 3 Encounters:  02/20/18 (!) 156/82  02/15/18 (!) 112/56  01/22/18 (!) 142/80    Allergies  Allergen Reactions  . Benazepril Cough  . Pravastatin Other (See Comments)    myositis  . Glipizide Nausea Only    Medications Reviewed Today    Reviewed by [name], MD (Physician) on 02/20/18 at 1400  Med List Status: <None>  Medication Order Taking? Sig Documenting Provider Last Dose Status Informant  ACCU-CHEK FASTCLIX LANCETS [place] [id number] Yes USE  TO CHECK BLOOD  SUGAR THREE TIMES DAILY Elayne Snare, MD Taking Active   ACCU-CHEK GUIDE test strip [id number] Yes USE TO CHECK BLOOD SUGAR 3 TIMES DAILY. Elayne Snare, MD Taking Active   albuterol (PROVENTIL HFA;VENTOLIN HFA) 108 [phone] Base) MCG/ACT inhaler [id number] Yes Inhale 2 puffs into the lungs every 6 (six) hours as needed for wheezing or shortness of breath. [name], MD Taking Active            Med Note Pola Corn   Wed May 23, 2017  1:47 PM) prn  aspirin EC 81 MG tablet [id number] Yes Take 81 mg by mouth every morning. [provider] Taking Active Self           Med Note Arnette Schaumann Oct 25, 2016 11:45 AM)    B-D ULTRAFINE III SHORT PEN 31G X 8 MM MISC [id number] Yes USE AS DIRECTED FIVE TIMES A DAY [name], MD Taking Active   Blood Glucose Monitoring Suppl (TRUE METRIX AIR GLUCOSE METER) w/Device KIT [id number] Yes  [provider] Taking Active   cetirizine (ZYRTEC) 10 MG tablet [id number] Yes Take 10 mg by mouth daily. [provider] Taking Active Self           Med Note Arnette Schaumann Oct 25, 2016 11:45 AM)    Cholecalciferol 2000 units TABS [id number] Yes Take 1 tablet (2,000 Units total) by mouth daily. [name], MD Taking Active  DULoxetine (CYMBALTA) 60 MG capsule [id number] Yes TAKE 1 CAPSULE BY MOUTH ONCE DAILY [name], MD Taking Active   emtricitabine-rilpivir-tenofovir AF (ODEFSEY) 200-25-25 MG TABS tablet [id number] Yes Take 1 tablet by mouth daily with breakfast. Thayer Headings, MD Taking Active   Eszopiclone 3 MG TABS [id number] Yes Take 1 tablet (3 mg total) by mouth at bedtime. [name], MD Taking Active   furosemide (LASIX) 20 MG tablet [id number] Yes Take 1 tablet (20 mg total) by mouth 2 (two) times daily. [name], MD  Active   gabapentin (NEURONTIN) 300 MG capsule [id number] Yes TAKE 1 CAPSULE BY MOUTH THREE TIMES A Luanna Salk, MD Taking Active   Glycopyrrolate-Formoterol (BEVESPI AEROSPHERE)  9-4.8 MCG/ACT AERO [id number] Yes Inhale 2 puffs into the lungs 2 (two) times daily. [name], MD Taking Active   insulin aspart (NOVOLOG FLEXPEN) 100 UNIT/ML FlexPen [id number] Yes 25 units before each meal Elayne Snare, MD Taking Active   linaclotide [facility]) 72 MCG capsule [id number] Yes Take 1 capsule (72 mcg total) by mouth daily before breakfast. [name], MD Taking Active   Melatonin 3 MG TABS [id number] Yes Take 2 tablets (6 mg total) by mouth at bedtime. [name], MD Taking Active   metFORMIN (GLUCOPHAGE-XR) 500 MG 24 hr tablet [id number] Yes TAKE 2 TABLETS BY MOUTH TWICE A Luanna Salk, MD Taking Active   MULTIPLE VITAMIN PO [id number] Yes Take 1 tablet by mouth. [provider] Taking Active   Nebivolol HCl (BYSTOLIC) 20 MG TABS [id number] Yes Take 1 tablet (20 mg total) by mouth daily. [name], MD Taking Active   olmesartan-hydrochlorothiazide [facility] HCT) 40-12.5 MG tablet [id number] Yes Take 1 tablet by mouth daily. [name], MD Taking Active   pioglitazone (ACTOS) 30 MG tablet [id number] Yes TAKE 1 TABLET BY MOUTH ONCE DAILY. NEEDS APPOINTMENT FOR FURTHER REFILLS Elayne Snare, MD Taking Active   TOUJEO MAX SOLOSTAR 300 UNIT/ML SOPN [id number] Yes INJECT 80 UNITS UNDER THE SKIN EVERY MORNING AND 70 UNITS EVERY EVENING(PER MD NEED FOLLOW UP APPOINTMENT FOR FULL RX)  Patient taking differently:  Inject 80 Units into the skin 2 (two) times daily. INJECT 80 UNITS UNDER THE SKIN TWICE DAILY.   Elayne Snare, MD Taking Active   trazodone (DESYREL) 300 MG tablet [id number] Yes Take 1 tablet (300 mg total) by mouth at bedtime. [name], MD Taking Active   triamcinolone cream (KENALOG) 0.5 % [id number] Yes APPLY 1 APPLICATION TOPICALLY THREE TIME A DAY [name], MD Taking Active           Assessment:  Drugs sorted by system:  Neurologic/Psychologic: duloxetine, gabapentin, eszopiclone, melatonin, trazodone  Cardiovascular: aspirin 58m,  furosemide, olmesartan-HCTZ, nevibolol  Pulmonary/Allergy: albuterol, glycopyrrolate-formoterol inh, cetirizine  Gastrointestinal:linaclotide  Endocrine:insulin aspart, insulin glargine (300 units/mL), pioglitazone, metformin  Topical:triamcinolone  Infectious Diseases:[place]  Vitamins/Minerals/Supplements: cholecalciferol, MVI  Medication Review Findings:  . Furosemide: Taking once daily rather than 2x daily as prescribed. Reviewed instructions as written by provider.  Patient voiced understanding and will start taking BID.   .Marland KitchenOlmesartan - HCTZ: Allergy listed to ACEI due to cough.  Monitor closely for cross-reactivity with ARB.     Medication Assistance Findings:  Medication assistance needs identified.   Toujeo + Novolog + Test strips + Linzess + Zyrtec   Extra Help:   _0  Already receiving Full Extra Help  _1  Already receiving Partial Extra Help  _2  Eligible based on reported income and assets  _3   Not Eligible based on reported income and assets  Assisted patient with applying for Extra Help online.  Reviewed program benefits and eligibility criteria.   Patient Assistance Programs: 1) Linzess made by [employer].  Application must include denial letter from [facility]. Need to know copay amount.   o Income requirement met: _0  Yes _1  No _2  Unknown o Out-of-pocket prescription expenditure met:    _3  Yes _4  No  _5  Unknown  <XLEZVGJFTNBZXYDS>_8<\/VTVNRWCHJSCBIPJR>_9  Not applicable        2)  Novolog made by [place] requirement met: _7  Yes _8  No  _9  Unknown o Out-of-pocket prescription expenditure met:   _10  Yes _11  No   _12  Unknown <ZPSUGAYGEFUWTKTC>_2<\/EQFDVOUZHQUIQNVV>_87  Not applicable Alternative option is to substitute to Humalog made by [employer] which does not have any out-of-pocket expenditure         3)    Toujeo made by [employer].  o Income requirement met: _14  Yes _15  No  _16  Unknown o Out-of-pocket prescription expenditure met:   _17  Yes _18  No   _19  Unknown <AJLUNGBMBOMQTTCN>_6<\/FREVQWQVLDKCCQFJ>_01  Not applicable  o Will wait to apply for any patient assistance programs until  Extra Help decision made.  Additional medication assistance options reviewed with patient as warranted:  Insurance OTC catalogue Reviewed benefits of OTC catalogue. Patient may be able to order all OTC products (ASA, cetirizine, MVI, melatonin, and vitamin D) via catalogue.  Provided patient with telephone number to call company. Patient declined offer to call together today.  I will send patient a copy of the catalogue to review as well.   3-way care coordination call placed to [place]. Accu-Check Guide test strips are ready for pick up: $16.85 for 90 day supply using [employer] (#400).  No lancet prescription called in yet.   Plan: -Will mail patient copy of [employer] OTC catalogue -Will request lancet [facility] by Dr. [name] -F/u in 1 month with patient regarding Extra Help application  Ralene Bathe, PharmD, [place] [phone]

## 2018-03-05 ENCOUNTER — Encounter: Payer: Self-pay | Admitting: *Deleted

## 2018-03-05 ENCOUNTER — Other Ambulatory Visit: Payer: Self-pay | Admitting: *Deleted

## 2018-03-05 NOTE — Patient Outreach (Signed)
[facility]) Care Management  03/05/2018  Nicholas Whitehead July 16, 1955 [id number]   CSW made an initial attempt to try and contact patient today to perform the initial phone assessment, as well as assess and assist with social work needs and services, without success.  A HIPAA compliant message was left for patient on voicemail.  CSW is currently awaiting a return call.  CSW will make a second outreach attempt within the next 3-4 business days, if a return call is not received from patient in the meantime. CSW will also mail an Outreach Letter to patient's home requesting that patient contact CSW if patient is interested in receiving social work services through [place] with Scientist, clinical (histocompatibility and immunogenetics).  Nat Christen, BSW, MSW, LCSW  Licensed Education officer, environmental Health System  Mailing [place] [address] [place], [place] [address] Physical [address], [place], [place] [address] Toll Free Main # [phone] Fax # [phone] Cell # [phone]  Office # [phone] [email or web address]

## 2018-03-08 ENCOUNTER — Other Ambulatory Visit: Payer: Self-pay | Admitting: Internal Medicine

## 2018-03-08 DIAGNOSIS — F419 Anxiety disorder, unspecified: Principal | ICD-10-CM

## 2018-03-08 DIAGNOSIS — F329 Major depressive disorder, single episode, unspecified: Secondary | ICD-10-CM

## 2018-03-08 DIAGNOSIS — F32A Depression, unspecified: Secondary | ICD-10-CM

## 2018-03-11 ENCOUNTER — Other Ambulatory Visit: Payer: Self-pay | Admitting: *Deleted

## 2018-03-11 NOTE — Patient Outreach (Signed)
[facility]) Care Management  03/11/2018  Nicholas Whitehead 01/05/1956 [id number]   CSW made a second attempt to try and contact patient today to perform phone assessment, as well as assess and assist with social work needs and services, without success.  A HIPAA compliant message was left for patient on voicemail.  CSW continues to await a return call.  CSW will make a third and final outreach attempt within the next 3-4 business days, if a return call is not received from patient in the meantime.  CSW will then proceed with case closure if a return call is not received from patient with a total of 10 business days, as required number of phone attempts will have been made and outreach letter mailed.  Nat Christen, BSW, MSW, LCSW  Licensed Education officer, environmental Health System  Mailing [place] [address] [place], [place] [address] Physical Address[address], [place], [place] [address] Toll Free Main # [phone] Fax # [phone] Cell # [phone]  Office # [phone] [email or web address]

## 2018-03-14 ENCOUNTER — Other Ambulatory Visit: Payer: Self-pay | Admitting: *Deleted

## 2018-03-14 ENCOUNTER — Encounter: Payer: Self-pay | Admitting: *Deleted

## 2018-03-14 NOTE — Patient Outreach (Signed)
[place] [facility]) Care Management  03/14/2018  Biff Rutigliano 19-May-1955 [id number]   Erroneous Encounter.  Nat Christen, BSW, MSW, LCSW  Licensed Education officer, environmental Health System  Mailing [place] [address], [place], [place] [address] Physical [address] [place], [place], [place] [address] Toll Free Main # [phone] Fax # [phone] Cell # [phone]  Office # [phone] [email or web address]

## 2018-03-14 NOTE — Patient Outreach (Addendum)
[facility]) Care Management  03/14/2018  Tsugio Elison 1955-01-26 [id number]   CSW received an incoming return call from patient today, in response to the HIPAA compliant outreach letter that [place] mailed to patient's home on Tuesday, March 05, 2018.  CSW was able to perform the initial phone assessment with patient, as well as assess and assist with social work needs and services.  CSW introduced self, explained role and types of services provided through [place] Management ([place] [facility]).  CSW further explained to patient that CSW works with patient's Telephonic RNCM, also with [place] Management, Jackelyn Poling. CSW then explained the reason for the two call attempts made and outreach letter mailed to patient's home, indicating that Mrs. Lavina Hamman thought that patient would benefit from social work services and resources to assist with counseling and supportive services for symptoms of depression.  CSW obtained two HIPAA compliant identifiers from patient, which included patient's name and date of birth.  Patient admitted to experiencing symptoms of depression, reporting that he has struggled with anxiety and depression for most of his life.  Patient did not wish to disclose to CSW why he is experiencing these symptoms, but agreed to seek outpatient counseling services with a male therapist.  Patient went on to say that he is too embarrassed to discuss his "situation" over the phone, but also did not wish for CSW to schedule a home visit with him for CSW to provide brief counseling and supportive services, at least until CSW is able to get patient established with a male therapist in the community.  Patient was agreeable to having CSW male him a list of male therapists that accept [facility]  CSW was able to research a list of 8 different therapists in [facility] that are male and accept [facility], agreeing to place the information in the mail  to patient today.  Patient denied feeling homicidal or suicidal at this time, indicating that he is "much too selfish" to take his own life.  Patient admits to taking his psychotropic medications (Cymbalta 60 MG PO Daily, Neurontin 300 MG PO Daily and Desyrel 300 MG PO Daily) exactly as prescribed and wishes to continue to have his Primary Care Physician, Dr. Scarlette Calico prescribe them, as he reported that Dr. [name] knows his situation well.  Patient denied having a very good support system, indicating that he has 7 siblings, but that none of them are really close to him, as they do not agree with his lifestyle choices.  CSW agreed to follow-up with patient on Wednesday, March 20, 2018 around 1:00PM to further assess for social work needs and services, as well as ensure that patient received the packet of resource information mailed to his home.  Patient voiced understanding and was most appreciative of the resources and supportive services provided.  Nat Christen, BSW, MSW, LCSW  Licensed Education officer, environmental Health System  Mailing [place] [address], [place], [place] [address] Physical Address[address] [place], [place], [place] [address] Toll Free Main # [phone] Fax # [phone] Cell # [phone]  Office # [phone] [email or web address]

## 2018-03-15 ENCOUNTER — Other Ambulatory Visit: Payer: Self-pay | Admitting: Internal Medicine

## 2018-03-15 ENCOUNTER — Telehealth: Payer: Self-pay

## 2018-03-15 DIAGNOSIS — K5904 Chronic idiopathic constipation: Secondary | ICD-10-CM

## 2018-03-15 MED ORDER — LINACLOTIDE 72 MCG PO CAPS: 72.0000 ug | ORAL_CAPSULE | Freq: Every day | ORAL | 1 refills | Status: DC

## 2018-03-15 NOTE — Telephone Encounter (Signed)
Pt is requesting a refill of the Linzess.

## 2018-03-15 NOTE — Telephone Encounter (Signed)
rx has been sent in

## 2018-03-18 ENCOUNTER — Other Ambulatory Visit: Payer: Medicare HMO

## 2018-03-18 ENCOUNTER — Ambulatory Visit: Payer: Self-pay | Admitting: *Deleted

## 2018-03-18 ENCOUNTER — Other Ambulatory Visit: Payer: Self-pay

## 2018-03-18 DIAGNOSIS — Z113 Encounter for screening for infections with a predominantly sexual mode of transmission: Secondary | ICD-10-CM

## 2018-03-18 DIAGNOSIS — Z79899 Other long term (current) drug therapy: Secondary | ICD-10-CM | POA: Diagnosis not present

## 2018-03-18 DIAGNOSIS — B20 Human immunodeficiency virus [HIV] disease: Secondary | ICD-10-CM

## 2018-03-19 LAB — T-HELPER CELLS (CD4) COUNT (NOT AT ARMC)
CD4 % Helper T Cell: 32 % — ABNORMAL LOW (ref 33–55)
CD4 T Cell Abs: 500 /uL (ref 400–2700)

## 2018-03-20 ENCOUNTER — Encounter: Payer: Self-pay | Admitting: *Deleted

## 2018-03-20 ENCOUNTER — Other Ambulatory Visit: Payer: Self-pay | Admitting: *Deleted

## 2018-03-20 LAB — CBC WITH DIFFERENTIAL/PLATELET
Absolute Monocytes: 493 cells/uL (ref 200–950)
Basophils Absolute: 11 cells/uL (ref 0–200)
Basophils Relative: 0.2 %
Eosinophils Absolute: 73 cells/uL (ref 15–500)
Eosinophils Relative: 1.3 %
HCT: 44.4 % (ref 38.5–50.0)
Hemoglobin: 15.3 g/dL (ref 13.2–17.1)
Lymphs Abs: 1708 cells/uL (ref 850–3900)
MCH: 34.2 pg — ABNORMAL HIGH (ref 27.0–33.0)
MCHC: 34.5 g/dL (ref 32.0–36.0)
MCV: 99.1 fL (ref 80.0–100.0)
MPV: 10.4 fL (ref 7.5–12.5)
Monocytes Relative: 8.8 %
NEUTROS ABS: 3315 {cells}/uL (ref 1500–7800)
Neutrophils Relative %: 59.2 %
Platelets: 166 10*3/uL (ref 140–400)
RBC: 4.48 10*6/uL (ref 4.20–5.80)
RDW: 13.1 % (ref 11.0–15.0)
Total Lymphocyte: 30.5 %
WBC: 5.6 10*3/uL (ref 3.8–10.8)

## 2018-03-20 LAB — COMPREHENSIVE METABOLIC PANEL
AG Ratio: 1.3 (calc) (ref 1.0–2.5)
ALT: 13 U/L (ref 9–46)
AST: 15 U/L (ref 10–35)
Albumin: 4.3 g/dL (ref 3.6–5.1)
Alkaline phosphatase (APISO): 40 U/L (ref 35–144)
BUN: 10 mg/dL (ref 7–25)
CO2: 30 mmol/L (ref 20–32)
Calcium: 9.5 mg/dL (ref 8.6–10.3)
Chloride: 101 mmol/L (ref 98–110)
Creat: 0.99 mg/dL (ref 0.70–1.25)
GLUCOSE: 176 mg/dL — AB (ref 65–99)
Globulin: 3.3 g/dL (calc) (ref 1.9–3.7)
Potassium: 4.8 mmol/L (ref 3.5–5.3)
Sodium: 137 mmol/L (ref 135–146)
Total Bilirubin: 0.3 mg/dL (ref 0.2–1.2)
Total Protein: 7.6 g/dL (ref 6.1–8.1)

## 2018-03-20 LAB — HIV-1 RNA QUANT-NO REFLEX-BLD
HIV 1 RNA Quant: <20 copies/mL
HIV-1 RNA Quant, Log: <1.3 Log copies/mL

## 2018-03-20 LAB — LIPID PANEL
Cholesterol: 156 mg/dL (ref <200)
HDL: 42 mg/dL (ref >=40)
LDL Cholesterol (Calc): 90 mg/dL (calc)
Non-HDL Cholesterol (Calc): 114 mg/dL (calc) (ref <130)
Total CHOL/HDL Ratio: 3.7 (calc) (ref <5.0)
Triglycerides: 137 mg/dL (ref <150)

## 2018-03-20 LAB — RPR: RPR Ser Ql: NONREACTIVE

## 2018-03-20 NOTE — Patient Outreach (Signed)
[facility]) Care Management  03/20/2018  Nicholas Whitehead 1955-03-20 [id number]   CSW was able to make contact with patient today to follow-up regarding social work services and resources, as well as ensure that patient received the packet of resource information mailed to his home by CSW.  Patient confirmed that he received the information and plans to begin calling the list of male therapists in [facility] that accept [employer].  Patient denied needing assistance with the referral process, nor did patient wish to disclose his "situation" with CSW.  CSW will perform a case closure patient, as all goals of treatment have been met from social work standpoint and no additional social work needs have been identified at this time.  CSW will notify patient's Pharmacist with [place] [facility], Ralene Bathe of CSW's plans to close patient's case.  CSW will fax an update to patient's Primary Care Physician, Dr.Thomas Jones  to ensure that they are aware of CSW's involvement with patient's plan of care.  CSW was able to confirm that patient has the correct contact information for CSW, encouraging patient to contact CSW directly if additional social work needs arise in the near future.  Nat Christen, BSW, MSW, LCSW  Licensed Education officer, environmental Health System  Mailing [place] [address] [place], [place] [address] Physical Address[address] [place], [place], [place] [address] Toll Free Main # [phone] Fax # [phone] Cell # [phone]  Office # [phone] [email or web address]

## 2018-03-25 ENCOUNTER — Other Ambulatory Visit: Payer: Self-pay | Admitting: Endocrinology

## 2018-03-27 ENCOUNTER — Other Ambulatory Visit: Payer: Self-pay | Admitting: *Deleted

## 2018-03-27 ENCOUNTER — Encounter: Payer: Self-pay | Admitting: *Deleted

## 2018-03-27 NOTE — Patient Outreach (Signed)
[facility]) Care Management  03/27/2018  Abrham Maslowski 1955/11/29 [id number]   CSW was able to make contact with patient today, per patient's request, to answer specific questions pertaining to the list of resources that CSW mailed to patient's home.  CSW spent an extensive amount of time on the phone with patient today, answering questions, providing counseling and supportive services and listening to patient talk about various negative events that have taken place in his life.  Patient admitted to having a much better understanding of what he can expect from a therapist and from receiving ongoing counseling services. Patient believes that he has been able to narrow done his choices of male therapists in [place], [place] that accept [employer], from the list provided to him by CSW.  Patient agreed to begin calling these therapists to schedule an initial appointment.  No additional social work needs have been identified at this time.  Nat Christen, BSW, MSW, LCSW  Licensed Education officer, environmental Health System  Mailing [place] [address], [place], [place] [address] Physical Address[address] [place], [place], [place] [address] Toll Free Main # [phone] Fax # [phone] Cell # [phone]  Office # [phone] [email or web address]

## 2018-03-31 ENCOUNTER — Other Ambulatory Visit: Payer: Self-pay | Admitting: Internal Medicine

## 2018-03-31 DIAGNOSIS — B20 Human immunodeficiency virus [HIV] disease: Secondary | ICD-10-CM

## 2018-04-01 ENCOUNTER — Other Ambulatory Visit: Payer: Self-pay

## 2018-04-01 ENCOUNTER — Encounter: Payer: Self-pay | Admitting: Internal Medicine

## 2018-04-01 ENCOUNTER — Ambulatory Visit: Payer: Medicare HMO | Admitting: Pharmacist

## 2018-04-01 ENCOUNTER — Ambulatory Visit (INDEPENDENT_AMBULATORY_CARE_PROVIDER_SITE_OTHER): Payer: Medicare HMO | Admitting: Internal Medicine

## 2018-04-01 VITALS — Wt 190.0 lb

## 2018-04-01 DIAGNOSIS — B20 Human immunodeficiency virus [HIV] disease: Secondary | ICD-10-CM

## 2018-04-01 DIAGNOSIS — Z5181 Encounter for therapeutic drug level monitoring: Secondary | ICD-10-CM | POA: Insufficient documentation

## 2018-04-01 DIAGNOSIS — Z113 Encounter for screening for infections with a predominantly sexual mode of transmission: Secondary | ICD-10-CM

## 2018-04-01 NOTE — Assessment & Plan Note (Signed)
Screened negative 

## 2018-04-01 NOTE — Progress Notes (Signed)
   Subjective:    Patient ID: Nicholas Whitehead, male    DOB: [birth year], 63 y.o.   MRN: [id number]  HPI Here for follow up of HIV Has been on [facility] and denies any missed doses.   CD4 of 500 and viral load < 20.  Having some increased depression due to being alone and is getting a list of male therapists via [facility].  No suicidal ideation.  No associated weight loss, no diarrhea.     Review of Systems  Constitutional: Negative for fatigue.  Gastrointestinal: Negative for diarrhea and nausea.  Skin: Negative for rash.       Objective:   Physical Exam Constitutional:      Appearance: He is well-developed.  HENT:     Mouth/Throat:     Pharynx: No oropharyngeal exudate.  Eyes:     General: No scleral icterus. Cardiovascular:     Rate and Rhythm: Normal rate and regular rhythm.     Heart sounds: Normal heart sounds. No murmur.  Pulmonary:     Effort: Pulmonary effort is normal. No respiratory distress.     Breath sounds: Normal breath sounds.  Skin:    Findings: No rash.     SH: remains tobacco free     Assessment & Plan:

## 2018-04-01 NOTE — Assessment & Plan Note (Signed)
Doing well, no issues.  rtc 6 months.  

## 2018-04-01 NOTE — Assessment & Plan Note (Signed)
Creat, LFTs ok 

## 2018-04-09 ENCOUNTER — Ambulatory Visit: Payer: Self-pay | Admitting: Pharmacist

## 2018-04-09 ENCOUNTER — Other Ambulatory Visit: Payer: Self-pay | Admitting: Pharmacist

## 2018-04-09 NOTE — Patient Outreach (Signed)
[place] [facility]) Care Management  [place] 04/09/2018  Nicholas Whitehead [birth year] [id number]  Reason for call: f/u on Extra Help application  Successful outreach call to Nicholas Whitehead.  Patient reports he has been approved for Extra Help LIS and medications are now affordable.  He states he has also requested all medications to be filled via [place] so that they can be delivered directly to his home as transportation to the pharmacy is difficult with [employer].  [employer] also offers no copay for Tier 1 and 2 medications which is an additional cost-savings.  Patient denies having any other medication questions or concerns at this time.  He is appreciative of [facility] services.   Plan: Will close [facility] pharmacy case at this time.  Am happy to assist in the future as needed.   Ralene Bathe, PharmD, [place] [phone]

## 2018-04-10 ENCOUNTER — Other Ambulatory Visit: Payer: Self-pay | Admitting: Endocrinology

## 2018-04-18 ENCOUNTER — Other Ambulatory Visit: Payer: Self-pay | Admitting: Internal Medicine

## 2018-04-18 DIAGNOSIS — K5904 Chronic idiopathic constipation: Secondary | ICD-10-CM

## 2018-04-18 DIAGNOSIS — J410 Simple chronic bronchitis: Secondary | ICD-10-CM

## 2018-04-18 DIAGNOSIS — E1140 Type 2 diabetes mellitus with diabetic neuropathy, unspecified: Secondary | ICD-10-CM

## 2018-04-18 DIAGNOSIS — Z794 Long term (current) use of insulin: Principal | ICD-10-CM

## 2018-04-18 DIAGNOSIS — I10 Essential (primary) hypertension: Secondary | ICD-10-CM

## 2018-04-18 MED ORDER — FUROSEMIDE 20 MG PO TABS: 20.0000 mg | ORAL_TABLET | Freq: Two times a day (BID) | ORAL | 1 refills | Status: DC

## 2018-04-18 MED ORDER — PIOGLITAZONE HCL 30 MG PO TABS: ORAL_TABLET | ORAL | 1 refills | Status: DC

## 2018-04-18 MED ORDER — GLYCOPYRROLATE-FORMOTEROL 9-4.8 MCG/ACT IN AERO: 2.0000 | INHALATION_SPRAY | Freq: Two times a day (BID) | RESPIRATORY_TRACT | 1 refills | Status: DC

## 2018-04-18 MED ORDER — LINACLOTIDE 72 MCG PO CAPS: 72.0000 ug | ORAL_CAPSULE | Freq: Every day | ORAL | 1 refills | Status: DC

## 2018-04-18 MED ORDER — INSULIN ASPART 100 UNIT/ML FLEXPEN: PEN_INJECTOR | SUBCUTANEOUS | 1 refills | Status: DC

## 2018-04-18 MED ORDER — OLMESARTAN MEDOXOMIL-HCTZ 40-12.5 MG PO TABS: 1.0000 | ORAL_TABLET | Freq: Every day | ORAL | 1 refills | Status: DC

## 2018-04-18 MED ORDER — METFORMIN HCL ER 500 MG PO TB24: ORAL_TABLET | ORAL | 1 refills | Status: DC

## 2018-04-18 MED ORDER — GABAPENTIN 300 MG PO CAPS: 300.0000 mg | ORAL_CAPSULE | Freq: Three times a day (TID) | ORAL | 1 refills | Status: DC

## 2018-04-19 ENCOUNTER — Other Ambulatory Visit: Payer: Self-pay | Admitting: Internal Medicine

## 2018-04-19 DIAGNOSIS — Z794 Long term (current) use of insulin: Principal | ICD-10-CM

## 2018-04-19 DIAGNOSIS — E1140 Type 2 diabetes mellitus with diabetic neuropathy, unspecified: Secondary | ICD-10-CM

## 2018-04-19 DIAGNOSIS — I10 Essential (primary) hypertension: Secondary | ICD-10-CM

## 2018-04-25 ENCOUNTER — Other Ambulatory Visit: Payer: Self-pay | Admitting: Endocrinology

## 2018-04-25 DIAGNOSIS — E1140 Type 2 diabetes mellitus with diabetic neuropathy, unspecified: Secondary | ICD-10-CM

## 2018-05-07 ENCOUNTER — Other Ambulatory Visit: Payer: Self-pay | Admitting: Internal Medicine

## 2018-05-07 DIAGNOSIS — B20 Human immunodeficiency virus [HIV] disease: Secondary | ICD-10-CM

## 2018-05-09 ENCOUNTER — Telehealth: Payer: Self-pay | Admitting: Internal Medicine

## 2018-05-09 NOTE — Telephone Encounter (Signed)
COVID-19 Pre-Screening Questions: ° °Do you currently have a fever (>100 °F), chills or unexplained body aches? No  ° °Are you currently experiencing new cough, shortness of breath, sore throat, runny nose?no   °•  °Have you recently travelled outside the state of [place] in the last 14 days? No   °•  °Have you been in contact with someone that is currently pending confirmation of Covid19 testing or has been confirmed to have the Covid19 virus?  No  °

## 2018-05-13 ENCOUNTER — Other Ambulatory Visit: Payer: Self-pay

## 2018-05-13 ENCOUNTER — Other Ambulatory Visit: Payer: Medicare HMO

## 2018-05-13 DIAGNOSIS — B20 Human immunodeficiency virus [HIV] disease: Secondary | ICD-10-CM | POA: Diagnosis not present

## 2018-05-14 ENCOUNTER — Other Ambulatory Visit (INDEPENDENT_AMBULATORY_CARE_PROVIDER_SITE_OTHER): Payer: Medicare HMO

## 2018-05-14 DIAGNOSIS — E1140 Type 2 diabetes mellitus with diabetic neuropathy, unspecified: Secondary | ICD-10-CM | POA: Diagnosis not present

## 2018-05-14 DIAGNOSIS — Z794 Long term (current) use of insulin: Secondary | ICD-10-CM | POA: Diagnosis not present

## 2018-05-14 LAB — BASIC METABOLIC PANEL
BUN: 21 mg/dL (ref 6–23)
CO2: 31 mEq/L (ref 19–32)
Calcium: 9.7 mg/dL (ref 8.4–10.5)
Chloride: 98 mEq/L (ref 96–112)
Creatinine, Ser: 1.87 mg/dL — ABNORMAL HIGH (ref 0.40–1.50)
GFR: 44.71 mL/min — ABNORMAL LOW (ref >60.00)
Glucose, Bld: 152 mg/dL — ABNORMAL HIGH (ref 70–99)
Potassium: 3.7 mEq/L (ref 3.5–5.1)
Sodium: 137 mEq/L (ref 135–145)

## 2018-05-14 LAB — T-HELPER CELL (CD4) - (RCID CLINIC ONLY)
CD4 % Helper T Cell: 33 % (ref 33–65)
CD4 T Cell Abs: 945 /uL (ref 400–1790)

## 2018-05-14 LAB — HEMOGLOBIN A1C: Hgb A1c MFr Bld: 8.1 % — ABNORMAL HIGH (ref 4.6–6.5)

## 2018-05-16 LAB — HIV-1 RNA QUANT-NO REFLEX-BLD
HIV 1 RNA Quant: <20 copies/mL — AB
HIV-1 RNA Quant, Log: <1.3 Log copies/mL — AB

## 2018-05-17 ENCOUNTER — Other Ambulatory Visit: Payer: Self-pay

## 2018-05-17 ENCOUNTER — Encounter: Payer: Self-pay | Admitting: Endocrinology

## 2018-05-17 ENCOUNTER — Ambulatory Visit (INDEPENDENT_AMBULATORY_CARE_PROVIDER_SITE_OTHER): Payer: Medicare HMO | Admitting: Endocrinology

## 2018-05-17 VITALS — BP 110/52 | HR 64 | Ht 73.0 in | Wt 195.2 lb

## 2018-05-17 DIAGNOSIS — E1165 Type 2 diabetes mellitus with hyperglycemia: Secondary | ICD-10-CM

## 2018-05-17 DIAGNOSIS — N289 Disorder of kidney and ureter, unspecified: Secondary | ICD-10-CM | POA: Diagnosis not present

## 2018-05-17 DIAGNOSIS — Z794 Long term (current) use of insulin: Secondary | ICD-10-CM | POA: Diagnosis not present

## 2018-05-17 DIAGNOSIS — I10 Essential (primary) hypertension: Secondary | ICD-10-CM

## 2018-05-17 MED ORDER — INSULIN GLARGINE (2 UNIT DIAL) 300 UNIT/ML ~~LOC~~ SOPN: 80.0000 [IU] | PEN_INJECTOR | Freq: Two times a day (BID) | SUBCUTANEOUS | 2 refills | Status: DC

## 2018-05-17 NOTE — Progress Notes (Signed)
Patient ID: Nicholas Whitehead, male   DOB: Nov 17, 1955, 63 y.o.   MRN: [id number]   Reason for Appointment: Type II Diabetes follow-up   History of Present Illness   Diagnosis date: 1998    Previous history: He was started on oral agents at diagnosis and around 2006 was put on insulin His blood sugars are usually well controlled when he takes Actos along with his insulin. He had been on Invokana since 04/2013  He requires large doses of insulin especially basal insulin   Recent history:   Insulin regimen: Toujeo 70 units bid;  Novolog 25 AC tid         Oral hypoglycemic drugs: Metformin ER 1000 mg twice a day, Actos 30 mg   His A1c is now 8.1, has been as low as 5.9   Current management, blood sugar patterns and problems:  Unclear why his A1c has gone up significantly even though his blood sugars at home are averaging only 157 for the last month  He is now using Accu-Chek  With his recent download his blood sugars appear to be somewhat inconsistent  Most of his blood sugars especially fasting and afternoon are higher now  He is giving somewhat inconsistent replies on whether he is taking his Toujeo regularly or not, he thinks he has been taking it consistently daily more recently  Previously was on 50 or 75 units Toujeo and now taking only 75 minutes  On his last visit he was told to reduce his NovoLog because of low normal sugars at times and decreased appetite  However his sugars appear to be lower only at times after dinner  He says that for various reason he has not been going out to do any walking although may be able to start doing this now  He says his diet has been variable and with difficulty buying groceries he may not be eating balanced foods, sometimes skipping meals or eating more snacks  Also has gained 5 pounds  Hypoglycemia not present      Side effects from medications:  urgency, frequent urination from Invokana Proper timing of medications  in relation to meals: Yes.          Monitors blood glucose:  3+ times a day  Glucometer:  True Metrix now, but was on Accu-Chek   PRE-MEAL Fasting Lunch  after noon Bedtime Overall  Glucose range:  101-206     87-260  Mean/median:  150  190  165   157   POST-MEAL PC Breakfast PC Lunch PC Dinner  Glucose range:    106-171  Mean/median:   145            Meals: Breakfast variable, 9-10 AM Lunch 2-3pm  usually avoiding high-fat meals, dinner at about 6 pm         Physical activity: exercise: Walking only recently  Dietician visit: Most recent: 2013    Weight control:  Wt Readings from Last 3 Encounters:  05/17/18 195 lb 3.2 oz (88.5 kg)  04/01/18 190 lb (86.2 kg)  02/20/18 190 lb (86.2 kg)          Diabetes labs:  Lab Results  Component Value Date   HGBA1C 8.1 (H) 05/14/2018   HGBA1C 7.1 (A) 02/15/2018   HGBA1C 5.9 10/23/2017   Lab Results  Component Value Date   MICROALBUR 0.9 02/15/2018   LDLCALC 90 03/18/2018   CREATININE 1.87 (H) 05/14/2018   Lab Results  Component Value Date  FRUCTOSAMINE 309 (H) 02/13/2018   FRUCTOSAMINE 270 06/13/2017   FRUCTOSAMINE 353 (H) 11/10/2016   FRUCTOSAMINE 293 (H) 06/22/2016   FRUCTOSAMINE 271 10/25/2015    Other active problems discussed today: See review of systems   Lab on 05/14/2018  Component Date Value Ref Range Status  . Sodium 05/14/2018 137  135 - 145 mEq/L Final  . Potassium 05/14/2018 3.7  3.5 - 5.1 mEq/L Final  . Chloride 05/14/2018 98  96 - 112 mEq/L Final  . CO2 05/14/2018 31  19 - 32 mEq/L Final  . Glucose, Bld 05/14/2018 152* 70 - 99 mg/dL Final  . BUN 05/14/2018 21  6 - 23 mg/dL Final  . Creatinine, Ser 05/14/2018 1.87* 0.40 - 1.50 mg/dL Final  . Calcium 05/14/2018 9.7  8.4 - 10.5 mg/dL Final  . GFR 05/14/2018 44.71* >60.00 mL/min Final  . Hgb A1c MFr Bld 05/14/2018 8.1* 4.6 - 6.5 % Final   Glycemic Control Guidelines for People with Diabetes:Non Diabetic:  <6%Goal of Therapy: <7%Additional Action  Suggested:  >8%   Appointment on 05/13/2018  Component Date Value Ref Range Status  . CD4 T Cell Abs 05/13/2018 945  400 - 1,790 /uL Final  . CD4 % Helper T Cell 05/13/2018 33  33 - 65 % Final   Performed at [facility], [place] [address], [place], [place] [address]  . HIV 1 RNA Quant 05/13/2018 <20 DETECTED* NOT DETECT copies/mL Final  . HIV-1 RNA Quant, Log 05/13/2018 <1.30 DETECTED* NOT DETECT Log copies/mL Final   Comment: . HIV-1 RNA was detected below 20 copies/mL. Viral nucleic acid detected below this level cannot be quantified by the assay.  . . This test was performed using Real-Time Polymerase Chain Reaction. . Reportable Range: 20 copies/mL to 10,000,000 copies/mL (1.30 log copies/mL to 7.00 log copies/mL).      Allergies as of 05/17/2018      Reactions   Benazepril Cough   Pravastatin Other (See Comments)   myositis   Glipizide Nausea Only      Medication List       Accurate as of May 17, 2018  3:58 PM. If you have any questions, ask your nurse or doctor.        albuterol 108 (90 Base) MCG/ACT inhaler Commonly known as:  VENTOLIN HFA Inhale 2 puffs into the lungs every 6 (six) hours as needed for wheezing or shortness of breath.   aspirin EC 81 MG tablet Take 81 mg by mouth every morning.   B-D ULTRAFINE III SHORT PEN 31G X 8 MM Misc Generic drug:  Insulin Pen Needle USE AS DIRECTED FIVE TIMES A DAY   cetirizine 10 MG tablet Commonly known as:  ZYRTEC Take 10 mg by mouth daily.   Cholecalciferol 50 MCG (2000 UT) Tabs Take 1 tablet (2,000 Units total) by mouth daily.   DULoxetine 60 MG capsule Commonly known as:  CYMBALTA TAKE 1 CAPSULE BY MOUTH ONCE DAILY   Eszopiclone 3 MG Tabs Take 1 tablet (3 mg total) by mouth at bedtime.   furosemide 20 MG tablet Commonly known as:  LASIX Take 1 tablet (20 mg total) by mouth 2 (two) times daily.   gabapentin 300 MG capsule Commonly known as:  NEURONTIN Take 1 capsule (300 mg total)  by mouth 3 (three) times daily.   glucose blood test strip Commonly known as:  Cool Blood Glucose Test Strips Check sugar 3-4 times per day. DX: E11.8   Glycopyrrolate-Formoterol 9-4.8 MCG/ACT Aero Commonly known  asMarval Regal Inhale 2 puffs into the lungs 2 (two) times daily.   insulin aspart 100 UNIT/ML FlexPen Commonly known as:  NovoLOG FlexPen 25 units before each meal   Lancets Misc Use to test blood sugar tid. DX: E11.8   linaclotide 72 MCG capsule Commonly known as:  Linzess Take 1 capsule (72 mcg total) by mouth daily before breakfast.   Melatonin 3 MG Tabs Take 2 tablets (6 mg total) by mouth at bedtime.   metFORMIN 500 MG 24 hr tablet Commonly known as:  GLUCOPHAGE-XR TAKE 2 TABLETS BY MOUTH TWICE A DAY   MULTIPLE VITAMIN PO Take 1 tablet by mouth.   Nebivolol HCl 20 MG Tabs Commonly known as:  Bystolic Take 1 tablet (20 mg total) by mouth daily.   Odefsey 200-25-25 MG Tabs tablet Generic drug:  emtricitabine-rilpivir-tenofovir AF TAKE 1 TABLET BY MOUTH EVERY DAY   olmesartan-hydrochlorothiazide 40-12.5 MG tablet Commonly known as:  BENICAR HCT TAKE 1 TABLET BY MOUTH DAILY   pioglitazone 30 MG tablet Commonly known as:  ACTOS TAKE 1 TABLET BY MOUTH ONCE DAILY.   Toujeo Max SoloStar 300 UNIT/ML Sopn Generic drug:  Insulin Glargine (2 Unit Dial) INJECT 80 UNITS UNDER THE SKIN EVERY MORNING AND 70 UNITS EVERY EVENING(PER MD NEED FOLLOW UP APPOINTMENT FOR FULL RX) What changed:  See the new instructions.   trazodone 300 MG tablet Commonly known as:  DESYREL Take 1 tablet (300 mg total) by mouth at bedtime.   triamcinolone cream 0.5 % Commonly known as:  KENALOG APPLY 1 APPLICATION TOPICALLY THREE TIME A DAY   True Metrix Air Glucose Meter w/Device Kit       Allergies:  Allergies  Allergen Reactions  . Benazepril Cough  . Pravastatin Other (See Comments)    myositis  . Glipizide Nausea Only    Past Medical History:  Diagnosis  Date  . Anxiety   . Cataract    right   . Depression   . Diabetes mellitus without complication ([place])   . DM (diabetes mellitus screen)   . HIV disease ([place]) 09/12/1995  . HIV infection ([place])   . Hypercholesterolemia   . Hyperlipidemia   . Hypertension     Past Surgical History:  Procedure Laterality Date  . COLONOSCOPY WITH PROPOFOL  01/16/2012   Procedure: COLONOSCOPY WITH PROPOFOL;  Surgeon: Garlan Fair, MD;  Location: [facility] ENDOSCOPY;  Service: Endoscopy;  Laterality: N/A;  . EYE SURGERY  2005   catarct surgery  . optic lens surgery  2005    Family History  Problem Relation Age of Onset  . Arthritis Mother   . Diabetes Mother   . Heart disease Mother   . Stroke Mother   . Arthritis Father   . Diabetes Father   . Heart disease Father     Social History:  reports that he quit smoking about 19 months ago. His smoking use included cigarettes. He smoked 0.25 packs per day. He has never used smokeless tobacco. He reports that he does not drink alcohol or use drugs.  Review of Systems:   Hypertension:  currently on Bystolic  and on Benicar HCT, previously on losartan, followed by PCP Also on Lasix as needed  BP Readings from Last 3 Encounters:  05/17/18 (!) 110/52  02/20/18 (!) 156/82  02/15/18 (!) 112/56   RENAL dysfunction: His creatinine is much higher than usual and unclear why He does not take any Advil or Aleve type of products  Lab Results  Component Value Date   CREATININE 1.87 (H) 05/14/2018   CREATININE 0.99 03/18/2018   CREATININE 0.86 02/12/2018    Lipids: LDL is normal as of 3/20 followed by PCP   Lab Results  Component Value Date   CHOL 156 03/18/2018   HDL 42 03/18/2018   LDLCALC 90 03/18/2018   TRIG 137 03/18/2018   CHOLHDL 3.7 03/18/2018    He has had symptoms of neuropathy with paresthesia in feet and legs, these are controlled with gabapentin 300 mg 3 times a day      LABS:  Lab on 05/14/2018  Component Date Value Ref Range  Status  . Sodium 05/14/2018 137  135 - 145 mEq/L Final  . Potassium 05/14/2018 3.7  3.5 - 5.1 mEq/L Final  . Chloride 05/14/2018 98  96 - 112 mEq/L Final  . CO2 05/14/2018 31  19 - 32 mEq/L Final  . Glucose, Bld 05/14/2018 152* 70 - 99 mg/dL Final  . BUN 05/14/2018 21  6 - 23 mg/dL Final  . Creatinine, Ser 05/14/2018 1.87* 0.40 - 1.50 mg/dL Final  . Calcium 05/14/2018 9.7  8.4 - 10.5 mg/dL Final  . GFR 05/14/2018 44.71* >60.00 mL/min Final  . Hgb A1c MFr Bld 05/14/2018 8.1* 4.6 - 6.5 % Final   Glycemic Control Guidelines for People with Diabetes:Non Diabetic:  <6%Goal of Therapy: <7%Additional Action Suggested:  >8%   Appointment on 05/13/2018  Component Date Value Ref Range Status  . CD4 T Cell Abs 05/13/2018 945  400 - 1,790 /uL Final  . CD4 % Helper T Cell 05/13/2018 33  33 - 65 % Final   Performed at [facility], [place] [address], [place], [place] [address]  . HIV 1 RNA Quant 05/13/2018 <20 DETECTED* NOT DETECT copies/mL Final  . HIV-1 RNA Quant, Log 05/13/2018 <1.30 DETECTED* NOT DETECT Log copies/mL Final   Comment: . HIV-1 RNA was detected below 20 copies/mL. Viral nucleic acid detected below this level cannot be quantified by the assay.  . . This test was performed using Real-Time Polymerase Chain Reaction. . Reportable Range: 20 copies/mL to 10,000,000 copies/mL (1.30 log copies/mL to 7.00 log copies/mL).      Examination:   BP (!) 110/52 (BP Location: Left Arm, Patient Position: Sitting, Cuff Size: Normal)   Pulse 64   Ht 6' 1"  (1.854 m)   Wt 195 lb 3.2 oz (88.5 kg)   SpO2 93%   BMI 25.75 kg/m   Body mass index is 25.75 kg/m.     ASSESSMENT/ PLAN:    Diabetes type 2:  See history of present illness for detailed discussion of his current management, blood sugar patterns and problems identified  His A1c is higher at 8.1, previously as low as 5.9  Unclear whether he is consistent with his insulin although he thinks he is taking his Toujeo  regularly, asking for a new prescription today Diet has been irregular With inconsistent diet and lack of exercise he has gained weight He thinks he is regular with his oral medications including Actos and metformin  Discussed day-to-day management of his insulin, self-care for diabetes, blood sugar patterns, reviewed blood sugar readings from his Accu-Chek download Discussed blood sugar targets both fasting and after meals and frequency and timing of checking them  He likely is needing higher dose of insulin since most of his readings are consistently high in the last week or so  Recommendations:  Increase Toujeo up to 80 units twice a day, new prescription will  be sent  Specific guidelines given about how to adjust the morning and evening doses based on his pre-meal blood sugar at breakfast and dinner  He will take 30 units of NovoLog unless eating a small meal or less carbohydrate  Temporarily reduce metformin because of renal dysfunction  Continue Actos  Discussed frequency and timing of blood sugar monitoring and blood sugar targets  If monitoring 4 times a day may be eligible for the freestyle libre   Hypertension  blood pressure is low normal and will need to have him cut his Benicar HCT in half especially with renal dysfunction  RENAL dysfunction: Etiology unclear but may benefit from reduction of ARB drug He needs to be seen by PCP and further evaluation done   Patient Instructions  BLOOD sugar monitoring:  Check blood sugars on waking up at least 3 to 4 days a week  Also check blood sugars about 2 hours after meals and do this after different meals by rotation  Recommended blood sugar levels on waking up are 90-130 and about 2 hours after meal is 130-160  Please bring your blood sugar monitor to each visit, thank you  Continue walking regularly as tolerated  INSULIN dose: Toujeo is 80 units twice a day When blood sugars in the mornings are below 90 reduce the  evening dose down to 70 When blood sugars before dinnertime are below 90 reduce the morning dose to 70  NOVOLOG 30 units before meals unless eating more very small meals and less carbohydrates that you can take 25  REDUCE metformin to 1 tablet twice a day until kidney function is back to normal  Reduce olmesartan HCT to half a tablet  Please make appointment with Dr. [name] to see him about the kidneys in 1 month    Total visit time for evaluation and management of multiple problems and counseling =25 minutes   Jeronda Don 05/17/2018, 3:58 PM         ..

## 2018-05-17 NOTE — Patient Instructions (Signed)
BLOOD sugar monitoring:  Check blood sugars on waking up at least 3 to 4 days a week  Also check blood sugars about 2 hours after meals and do this after different meals by rotation  Recommended blood sugar levels on waking up are 90-130 and about 2 hours after meal is 130-160  Please bring your blood sugar monitor to each visit, thank you  Continue walking regularly as tolerated  INSULIN dose: Toujeo is 80 units twice a day When blood sugars in the mornings are below 90 reduce the evening dose down to 70 When blood sugars before dinnertime are below 90 reduce the morning dose to 70  NOVOLOG 30 units before meals unless eating more very small meals and less carbohydrates that you can take 25  REDUCE metformin to 1 tablet twice a day until kidney function is back to normal  Reduce olmesartan HCT to half a tablet  Please make appointment with Dr. [name] to see him about the kidneys in 1 month

## 2018-05-27 ENCOUNTER — Telehealth: Payer: Self-pay | Admitting: Internal Medicine

## 2018-05-27 NOTE — Telephone Encounter (Signed)
COVID-19 Pre-Screening Questions: ° °Do you currently have a fever (>100 °F), chills or unexplained body aches? No  ° °Are you currently experiencing new cough, shortness of breath, sore throat, runny nose? No  °•  °Have you recently travelled outside the state of [place] in the last 14 days? No  °•  °1. Have you been in contact with someone that is currently pending confirmation of Covid19 testing or has been confirmed to have the Covid19 virus?  No  ° °

## 2018-05-28 ENCOUNTER — Encounter: Payer: Self-pay | Admitting: Internal Medicine

## 2018-05-28 ENCOUNTER — Ambulatory Visit (INDEPENDENT_AMBULATORY_CARE_PROVIDER_SITE_OTHER): Payer: Medicare HMO | Admitting: Internal Medicine

## 2018-05-28 ENCOUNTER — Other Ambulatory Visit: Payer: Self-pay

## 2018-05-28 VITALS — BP 123/76 | HR 59 | Temp 98.4°F | Wt 200.0 lb

## 2018-05-28 DIAGNOSIS — B20 Human immunodeficiency virus [HIV] disease: Secondary | ICD-10-CM

## 2018-05-28 DIAGNOSIS — I10 Essential (primary) hypertension: Secondary | ICD-10-CM

## 2018-05-28 NOTE — Assessment & Plan Note (Signed)
Doing well, no issues.  rtc 6 months.  

## 2018-05-28 NOTE — Assessment & Plan Note (Signed)
BP good today

## 2018-05-28 NOTE — Progress Notes (Signed)
   Subjective:    Patient ID: Nicholas Whitehead, male    DOB: [birth year], 63 y.o.   MRN: [id number]  HPI Here for follow up of HIV Has been on [facility] and denies any missed doses.   CD4 of 945 and viral load < 20.  No associated weight loss, no diarrhea.     Review of Systems  Constitutional: Negative for fatigue.  Gastrointestinal: Negative for diarrhea and nausea.  Skin: Negative for rash.       Objective:   Physical Exam Constitutional:      Appearance: He is well-developed.  Eyes:     General: No scleral icterus. Cardiovascular:     Rate and Rhythm: Normal rate and regular rhythm.     Heart sounds: Normal heart sounds. No murmur.  Pulmonary:     Effort: Pulmonary effort is normal. No respiratory distress.     Breath sounds: Normal breath sounds.  Skin:    Findings: No rash.  Neurological:     Mental Status: He is alert.     SH: remains tobacco free     Assessment & Plan:

## 2018-06-10 ENCOUNTER — Other Ambulatory Visit: Payer: Self-pay | Admitting: Internal Medicine

## 2018-06-10 DIAGNOSIS — F419 Anxiety disorder, unspecified: Secondary | ICD-10-CM

## 2018-06-10 DIAGNOSIS — F329 Major depressive disorder, single episode, unspecified: Secondary | ICD-10-CM

## 2018-06-10 MED ORDER — DULOXETINE HCL 60 MG PO CPEP: 60.0000 mg | ORAL_CAPSULE | Freq: Every day | ORAL | 1 refills | Status: DC

## 2018-06-13 ENCOUNTER — Other Ambulatory Visit: Payer: Self-pay | Admitting: Internal Medicine

## 2018-06-13 DIAGNOSIS — F419 Anxiety disorder, unspecified: Secondary | ICD-10-CM

## 2018-06-13 DIAGNOSIS — F329 Major depressive disorder, single episode, unspecified: Secondary | ICD-10-CM

## 2018-06-14 ENCOUNTER — Other Ambulatory Visit: Payer: Self-pay | Admitting: Internal Medicine

## 2018-06-14 DIAGNOSIS — F32A Depression, unspecified: Secondary | ICD-10-CM

## 2018-06-14 DIAGNOSIS — F329 Major depressive disorder, single episode, unspecified: Secondary | ICD-10-CM

## 2018-06-18 ENCOUNTER — Other Ambulatory Visit: Payer: Self-pay

## 2018-06-25 ENCOUNTER — Other Ambulatory Visit: Payer: Self-pay

## 2018-06-25 ENCOUNTER — Ambulatory Visit (INDEPENDENT_AMBULATORY_CARE_PROVIDER_SITE_OTHER): Payer: Medicare HMO | Admitting: Internal Medicine

## 2018-06-25 ENCOUNTER — Encounter: Payer: Self-pay | Admitting: Internal Medicine

## 2018-06-25 VITALS — BP 142/60 | HR 68 | Temp 98.4°F | Resp 16 | Ht 73.0 in | Wt 200.0 lb

## 2018-06-25 DIAGNOSIS — I10 Essential (primary) hypertension: Secondary | ICD-10-CM

## 2018-06-25 DIAGNOSIS — E1165 Type 2 diabetes mellitus with hyperglycemia: Secondary | ICD-10-CM | POA: Diagnosis not present

## 2018-06-25 DIAGNOSIS — Z794 Long term (current) use of insulin: Secondary | ICD-10-CM | POA: Diagnosis not present

## 2018-06-25 DIAGNOSIS — N183 Chronic kidney disease, stage 3 unspecified: Secondary | ICD-10-CM | POA: Insufficient documentation

## 2018-06-25 LAB — POCT GLYCOSYLATED HEMOGLOBIN (HGB A1C): Hemoglobin A1C: 7.3 % — AB (ref 4.0–5.6)

## 2018-06-25 NOTE — Patient Instructions (Signed)
Type 2 Diabetes Mellitus, Diagnosis, Adult Type 2 diabetes (type 2 diabetes mellitus) is a long-term (chronic) disease. In type 2 diabetes, one or both of these problems may be present:  The pancreas does not make enough of a hormone called insulin.  Cells in the body do not respond properly to insulin that the body makes (insulin resistance). Normally, insulin allows blood sugar (glucose) to enter cells in the body. The cells use glucose for energy. Insulin resistance or lack of insulin causes excess glucose to build up in the blood instead of going into cells. As a result, high blood glucose (hyperglycemia) develops. What increases the risk? The following factors may make you more likely to develop type 2 diabetes:  Having a family member with type 2 diabetes.  Being overweight or obese.  Having an inactive (sedentary) lifestyle.  Having been diagnosed with insulin resistance.  Having a history of prediabetes, gestational diabetes, or polycystic ovary syndrome (PCOS).  Being of American-Indian, African-American, Hispanic/Latino, or Asian/Pacific Islander descent. What are the signs or symptoms? In the early stage of this condition, you may not have symptoms. Symptoms develop slowly and may include:  Increased thirst (polydipsia).  Increased hunger(polyphagia).  Increased urination (polyuria).  Increased urination during the night (nocturia).  Unexplained weight loss.  Frequent infections that keep coming back (recurring).  Fatigue.  Weakness.  Vision changes, such as blurry vision.  Cuts or bruises that are slow to heal.  Tingling or numbness in the hands or feet.  Dark patches on the skin (acanthosis nigricans). How is this diagnosed? This condition is diagnosed based on your symptoms, your medical history, a physical exam, and your blood glucose level. Your blood glucose may be checked with one or more of the following blood tests:  A fasting blood glucose (FBG)  test. You will not be allowed to eat (you will fast) for 8 hours or longer before a blood sample is taken.  A random blood glucose test. This test checks blood glucose at any time of day regardless of when you ate.  An A1c (hemoglobin A1c) blood test. This test provides information about blood glucose control over the previous 2-3 months.  An oral glucose tolerance test (OGTT). This test measures your blood glucose at two times: ? After fasting. This is your baseline blood glucose level. ? Two hours after drinking a beverage that contains glucose. You may be diagnosed with type 2 diabetes if:  Your FBG level is 126 mg/dL (7.0 mmol/L) or higher.  Your random blood glucose level is 200 mg/dL (11.1 mmol/L) or higher.  Your A1c level is 6.5% or higher.  Your OGTT result is higher than 200 mg/dL (11.1 mmol/L). These blood tests may be repeated to confirm your diagnosis. How is this treated? Your treatment may be managed by a specialist called an endocrinologist. Type 2 diabetes may be treated by following instructions from your health care provider about:  Making diet and lifestyle changes. This may include: ? Following an individualized nutrition plan that is developed by a diet and nutrition specialist (registered dietitian). ? Exercising regularly. ? Finding ways to manage stress.  Checking your blood glucose level as often as told.  Taking diabetes medicines or insulin daily. This helps to keep your blood glucose levels in the healthy range. ? If you use insulin, you may need to adjust the dosage depending on how physically active you are and what foods you eat. Your health care provider will tell you how to adjust your dosage.    Taking medicines to help prevent complications from diabetes, such as: ? Aspirin. ? Medicine to lower cholesterol. ? Medicine to control blood pressure. Your health care provider will set individualized treatment goals for you. Your goals will be based on  your age, other medical conditions you have, and how you respond to diabetes treatment. Generally, the goal of treatment is to maintain the following blood glucose levels:  Before meals (preprandial): 80-130 mg/dL (4.4-7.2 mmol/L).  After meals (postprandial): below 180 mg/dL (10 mmol/L).  A1c level: less than 7%. Follow these instructions at home: Questions to ask your health care provider  Consider asking the following questions: ? Do I need to meet with a diabetes educator? ? Where can I find a support group for people with diabetes? ? What equipment will I need to manage my diabetes at home? ? What diabetes medicines do I need, and when should I take them? ? How often do I need to check my blood glucose? ? What number can I call if I have questions? ? When is my next appointment? General instructions  Take over-the-counter and prescription medicines only as told by your health care provider.  Keep all follow-up visits as told by your health care provider. This is important.  For more information about diabetes, visit: ? American Diabetes Association (ADA): www.diabetes.org ? American Association of Diabetes Educators (AADE): www.diabeteseducator.org Contact a health care provider if:  Your blood glucose is at or above 240 mg/dL (13.3 mmol/L) for 2 days in a row.  You have been sick or have had a fever for 2 days or longer, and you are not getting better.  You have any of the following problems for more than 6 hours: ? You cannot eat or drink. ? You have nausea and vomiting. ? You have diarrhea. Get help right away if:  Your blood glucose is lower than 54 mg/dL (3.0 mmol/L).  You become confused or you have trouble thinking clearly.  You have difficulty breathing.  You have moderate or large ketone levels in your urine. Summary  Type 2 diabetes (type 2 diabetes mellitus) is a long-term (chronic) disease. In type 2 diabetes, the pancreas does not make enough of a  hormone called insulin, or cells in the body do not respond properly to insulin that the body makes (insulin resistance).  This condition is treated by making diet and lifestyle changes and taking diabetes medicines or insulin.  Your health care provider will set individualized treatment goals for you. Your goals will be based on your age, other medical conditions you have, and how you respond to diabetes treatment.  Keep all follow-up visits as told by your health care provider. This is important. This information is not intended to replace advice given to you by your health care provider. Make sure you discuss any questions you have with your health care provider. Document Released: 12/26/2004 Document Revised: 07/27/2016 Document Reviewed: 01/29/2015 Elsevier Interactive Patient Education  2019 [employer] Inc[employer]  

## 2018-06-25 NOTE — Progress Notes (Signed)
Subjective:  Patient ID: Nicholas Whitehead, male    DOB: 1955/06/09  Age: 63 y.o. MRN: [id number]  CC: Diabetes and Hypertension   HPI Nicholas Whitehead presents for f/up - He feels well today and offers no complaints.  He is compliant with all of the listed meds.  He does not monitor his blood pressure or his blood sugars.  Outpatient Medications Prior to Visit  Medication Sig Dispense Refill   albuterol (PROVENTIL HFA;VENTOLIN HFA) 108 (90 Base) MCG/ACT inhaler Inhale 2 puffs into the lungs every 6 (six) hours as needed for wheezing or shortness of breath. 1 Inhaler 5   aspirin EC 81 MG tablet Take 81 mg by mouth every morning.     B-D ULTRAFINE III SHORT PEN 31G X 8 MM MISC USE AS DIRECTED FIVE TIMES A DAY 300 each 1   Blood Glucose Monitoring Suppl (TRUE METRIX AIR GLUCOSE METER) w/Device KIT      cetirizine (ZYRTEC) 10 MG tablet Take 10 mg by mouth daily.     Cholecalciferol 2000 units TABS Take 1 tablet (2,000 Units total) by mouth daily. 90 tablet 1   DULoxetine (CYMBALTA) 60 MG capsule TAKE 1 CAPSULE EVERY DAY 90 capsule 1   furosemide (LASIX) 20 MG tablet Take 1 tablet (20 mg total) by mouth 2 (two) times daily. 180 tablet 1   gabapentin (NEURONTIN) 300 MG capsule Take 1 capsule (300 mg total) by mouth 3 (three) times daily. 270 capsule 1   glucose blood (COOL BLOOD GLUCOSE TEST STRIPS) test strip Check sugar 3-4 times per day. DX: E11.8 375 each 3   Glycopyrrolate-Formoterol (BEVESPI AEROSPHERE) 9-4.8 MCG/ACT AERO Inhale 2 puffs into the lungs 2 (two) times daily. 3 Inhaler 1   insulin aspart (NOVOLOG FLEXPEN) 100 UNIT/ML FlexPen 25 units before each meal 30 mL 1   Insulin Glargine, 2 Unit Dial, (TOUJEO MAX SOLOSTAR) 300 UNIT/ML SOPN Inject 80 Units into the skin 2 (two) times a day. 3 pen 2   Lancets MISC Use to test blood sugar tid. DX: E11.8 300 each 3   linaclotide (LINZESS) 72 MCG capsule Take 1 capsule (72 mcg total) by mouth daily before breakfast. 90 capsule 1    metFORMIN (GLUCOPHAGE-XR) 500 MG 24 hr tablet TAKE 2 TABLETS BY MOUTH TWICE A DAY 360 tablet 1   Nebivolol HCl (BYSTOLIC) 20 MG TABS Take 1 tablet (20 mg total) by mouth daily. 90 tablet 1   ODEFSEY 200-25-25 MG TABS tablet TAKE 1 TABLET BY MOUTH EVERY DAY 30 tablet 5   olmesartan-hydrochlorothiazide (BENICAR HCT) 40-12.5 MG tablet TAKE 1 TABLET BY MOUTH DAILY 90 tablet 1   pioglitazone (ACTOS) 30 MG tablet TAKE 1 TABLET BY MOUTH ONCE DAILY. 90 tablet 1   trazodone (DESYREL) 300 MG tablet TAKE 1 TABLET(300 MG) BY MOUTH AT BEDTIME 90 tablet 3   triamcinolone cream (KENALOG) 0.5 % APPLY 1 APPLICATION TOPICALLY THREE TIME A DAY 100 g 1   Eszopiclone 3 MG TABS Take 1 tablet (3 mg total) by mouth at bedtime. 90 tablet 1   Melatonin 3 MG TABS Take 2 tablets (6 mg total) by mouth at bedtime. 180 tablet 1   MULTIPLE VITAMIN PO Take 1 tablet by mouth.     No facility-administered medications prior to visit.     ROS Review of Systems  Constitutional: Positive for unexpected weight change (wt gain). Negative for diaphoresis and fatigue.  HENT: Negative.   Respiratory: Negative for cough, chest tightness, shortness of breath and wheezing.  Cardiovascular: Negative for chest pain, palpitations and leg swelling.  Gastrointestinal: Negative for abdominal pain, constipation, diarrhea, nausea and vomiting.  Endocrine: Negative for polydipsia, polyphagia and polyuria.  Genitourinary: Negative.  Negative for difficulty urinating.  Musculoskeletal: Negative.  Negative for arthralgias and myalgias.  Skin: Negative.  Negative for color change.  Neurological: Negative.  Negative for dizziness, weakness, light-headedness and headaches.  Hematological: Negative for adenopathy. Does not bruise/bleed easily.  Psychiatric/Behavioral: Negative.     Objective:  BP (!) 142/60    Pulse 68    Temp 98.4 F (36.9 C) (Oral)    Resp 16    Ht 6' 1"  (1.854 m)    Wt 200 lb (90.7 kg)    SpO2 96%    BMI 26.39 kg/m     BP Readings from Last 3 Encounters:  06/25/18 (!) 142/60  05/28/18 123/76  05/17/18 (!) 110/52    Wt Readings from Last 3 Encounters:  06/25/18 200 lb (90.7 kg)  05/28/18 200 lb (90.7 kg)  05/17/18 195 lb 3.2 oz (88.5 kg)    Physical Exam Vitals signs reviewed.  Constitutional:      Appearance: He is not ill-appearing or diaphoretic.  HENT:     Nose: Nose normal.     Mouth/Throat:     Mouth: Mucous membranes are moist.  Eyes:     General: No scleral icterus.    Conjunctiva/sclera: Conjunctivae normal.  Neck:     Musculoskeletal: Normal range of motion. No neck rigidity or muscular tenderness.  Cardiovascular:     Rate and Rhythm: Normal rate and regular rhythm.     Heart sounds: No murmur.  Pulmonary:     Effort: Pulmonary effort is normal. No respiratory distress.     Breath sounds: No stridor. No wheezing, rhonchi or rales.  Abdominal:     General: Abdomen is flat.     Palpations: There is no hepatomegaly or splenomegaly.     Tenderness: There is no abdominal tenderness.  Musculoskeletal: Normal range of motion.     Right lower leg: No edema.     Left lower leg: No edema.  Lymphadenopathy:     Cervical: No cervical adenopathy.  Skin:    General: Skin is warm and dry.  Neurological:     General: No focal deficit present.     Mental Status: He is alert and oriented to person, place, and time. Mental status is at baseline.  Psychiatric:        Mood and Affect: Mood normal.        Behavior: Behavior normal.     Lab Results  Component Value Date   WBC 5.6 03/18/2018   HGB 15.3 03/18/2018   HCT 44.4 03/18/2018   PLT 166 03/18/2018   GLUCOSE 152 (H) 05/14/2018   CHOL 156 03/18/2018   TRIG 137 03/18/2018   HDL 42 03/18/2018   LDLCALC 90 03/18/2018   ALT 13 03/18/2018   AST 15 03/18/2018   NA 137 05/14/2018   K 3.7 05/14/2018   CL 98 05/14/2018   CREATININE 1.87 (H) 05/14/2018   BUN 21 05/14/2018   CO2 31 05/14/2018   TSH 1.74 10/23/2017   PSA 1.02  10/23/2017   HGBA1C 7.3 (A) 06/25/2018   MICROALBUR 0.9 02/15/2018    Dg Abdomen 1 View  Result Date: 09/11/2016 CLINICAL DATA:  Abdominal pain.  Constipation. EXAM: ABDOMEN - 1 VIEW COMPARISON:  None. FINDINGS: No dilated small bowel loops. Mild gas and stool in the large bowel. No  evidence of pneumatosis or pneumoperitoneum. No radiopaque urolithiasis. Mild lumbar spondylosis. IMPRESSION: Nonobstructive bowel gas pattern.  Mild colonic stool burden. Electronically Signed   By: Ilona Sorrel M.D.   On: 09/11/2016 14:28    Assessment & Plan:   Nicholas Whitehead was seen today for diabetes and hypertension.  Diagnoses and all orders for this visit:  Uncontrolled type 2 diabetes mellitus with hyperglycemia, with long-term current use of insulin ([place])- Improvement noted.  His A1c is down to 7.3%.  Will continue the current glycemic regimen. -     Cancel: Ambulatory referral to Ophthalmology -     POCT HgB A1C  Essential hypertension- His blood pressure is adequately well controlled.  Chronic renal disease, stage 3, moderately decreased glomerular filtration rate (GFR) between 30-59 mL/min/1.73 square meter ([place])- He will avoid nephrotoxic agents.  He will continue to control his blood pressure and his blood sugars.   I have discontinued Nicholas Whitehead's MULTIPLE VITAMIN PO, Melatonin, and Eszopiclone. I am also having him maintain his cetirizine, aspirin EC, triamcinolone cream, albuterol, Cholecalciferol, B-D ULTRAFINE III SHORT PEN, True Metrix Air Glucose Meter, Nebivolol HCl, glucose blood, Lancets, Glycopyrrolate-Formoterol, pioglitazone, furosemide, gabapentin, linaclotide, metFORMIN, insulin aspart, olmesartan-hydrochlorothiazide, Odefsey, Insulin Glargine (2 Unit Dial), DULoxetine, and trazodone.  No orders of the defined types were placed in this encounter.    Follow-up: Return in about 6 months (around 12/25/2018).  Scarlette Calico, MD

## 2018-06-27 ENCOUNTER — Other Ambulatory Visit: Payer: Self-pay | Admitting: Internal Medicine

## 2018-06-27 ENCOUNTER — Other Ambulatory Visit: Payer: Self-pay | Admitting: Endocrinology

## 2018-06-27 ENCOUNTER — Telehealth: Payer: Self-pay

## 2018-06-27 DIAGNOSIS — F409 Phobic anxiety disorder, unspecified: Secondary | ICD-10-CM

## 2018-06-27 DIAGNOSIS — Z794 Long term (current) use of insulin: Secondary | ICD-10-CM

## 2018-06-27 DIAGNOSIS — E1140 Type 2 diabetes mellitus with diabetic neuropathy, unspecified: Secondary | ICD-10-CM

## 2018-06-27 DIAGNOSIS — F5105 Insomnia due to other mental disorder: Secondary | ICD-10-CM

## 2018-06-27 MED ORDER — BELSOMRA 15 MG PO TABS: 1.0000 | ORAL_TABLET | Freq: Every evening | ORAL | 5 refills | Status: DC | PRN

## 2018-06-27 NOTE — Telephone Encounter (Signed)
RX sent to his pharmacy  TJ

## 2018-06-27 NOTE — Telephone Encounter (Signed)
Spoke to pt and he states that he is still waking up at night and that a medication was mentioned during his visit. Pt could not remember the name of the medication.   Pt states that he is taking the trazadone 300 mg and the Lunesta 3mg  every night.   Please advise

## 2018-06-28 NOTE — Telephone Encounter (Signed)
Pt called and lvm. He stated he has tried the [employer] before. He stated that he had some adverse side effects. Please advise if it can be changed

## 2018-06-28 NOTE — Telephone Encounter (Signed)
Pt informed rx has been sent.   Pt informed to stop lunesta and start belsomra.   Pt informed that a PA may be needed for the belsomra, but that I would start asap.

## 2018-06-29 ENCOUNTER — Other Ambulatory Visit: Payer: Self-pay | Admitting: Internal Medicine

## 2018-06-29 DIAGNOSIS — F409 Phobic anxiety disorder, unspecified: Secondary | ICD-10-CM

## 2018-06-29 MED ORDER — ESZOPICLONE 3 MG PO TABS: 3.0000 mg | ORAL_TABLET | Freq: Every day | ORAL | 1 refills | Status: DC

## 2018-07-02 NOTE — Telephone Encounter (Signed)
If there is something in particular he wants to try for insomnia?

## 2018-07-02 NOTE — Telephone Encounter (Signed)
Pt called back. Pt informed that Johnnye Sima was sent in for pt symptoms (as previously rx'ed).   Just wanted to clarify that there were no other medications that pt could try for insomnia?

## 2018-07-04 NOTE — Telephone Encounter (Signed)
Pt contacted and he doesn't know what else to try.   Pt thanks Dr. [name] for his help with this but has decided to wait it out and continue with the Lunesta and Trazadone.

## 2018-07-09 ENCOUNTER — Other Ambulatory Visit: Payer: Self-pay

## 2018-07-09 ENCOUNTER — Other Ambulatory Visit (INDEPENDENT_AMBULATORY_CARE_PROVIDER_SITE_OTHER): Payer: Medicare HMO

## 2018-07-09 DIAGNOSIS — Z794 Long term (current) use of insulin: Secondary | ICD-10-CM | POA: Diagnosis not present

## 2018-07-09 DIAGNOSIS — E1165 Type 2 diabetes mellitus with hyperglycemia: Secondary | ICD-10-CM | POA: Diagnosis not present

## 2018-07-09 LAB — BASIC METABOLIC PANEL
BUN: 9 mg/dL (ref 6–23)
CO2: 31 mEq/L (ref 19–32)
Calcium: 9.6 mg/dL (ref 8.4–10.5)
Chloride: 101 mEq/L (ref 96–112)
Creatinine, Ser: 1.02 mg/dL (ref 0.40–1.50)
GFR: 89.94 mL/min (ref >60.00)
Glucose, Bld: 161 mg/dL — ABNORMAL HIGH (ref 70–99)
Potassium: 4 mEq/L (ref 3.5–5.1)
Sodium: 140 mEq/L (ref 135–145)

## 2018-07-10 LAB — FRUCTOSAMINE: Fructosamine: 295 umol/L — ABNORMAL HIGH (ref 0–285)

## 2018-07-15 ENCOUNTER — Ambulatory Visit (INDEPENDENT_AMBULATORY_CARE_PROVIDER_SITE_OTHER): Payer: Medicare HMO | Admitting: Endocrinology

## 2018-07-15 ENCOUNTER — Other Ambulatory Visit: Payer: Self-pay

## 2018-07-15 ENCOUNTER — Encounter: Payer: Self-pay | Admitting: Endocrinology

## 2018-07-15 VITALS — BP 116/72 | HR 62 | Ht 73.0 in | Wt 194.0 lb

## 2018-07-15 DIAGNOSIS — I10 Essential (primary) hypertension: Secondary | ICD-10-CM

## 2018-07-15 DIAGNOSIS — E1165 Type 2 diabetes mellitus with hyperglycemia: Secondary | ICD-10-CM

## 2018-07-15 DIAGNOSIS — Z794 Long term (current) use of insulin: Secondary | ICD-10-CM

## 2018-07-15 NOTE — Progress Notes (Signed)
Patient ID: Nicholas Whitehead, male   DOB: 1955/11/16, 63 y.o.   MRN: [id number]   Reason for Appointment: Type II Diabetes follow-up   History of Present Illness   Diagnosis date: 1998    Previous history: He was started on oral agents at diagnosis and around 2006 was put on insulin His blood sugars are usually well controlled when he takes Actos along with his insulin. He had been on Invokana since 04/2013  He requires large doses of insulin especially basal insulin   Recent history:   Insulin regimen: Was on Toujeo 80 units a.m. and 70 units p.m.;  Novolog 30 AC tid         Oral hypoglycemic drugs: Metformin ER 1000 mg twice a day, Actos 30 mg   His A1c is last 7.3 done by PCP, previously 8.1, has been as low as 5.9 Fructosamine now is 295 which is higher than his best reading of 270  Current management, blood sugar patterns and problems:  He now says that he has not been able to get his Toujeo refilled for several days and does not know why  Because of not taking Toujeo on his own he went up on his metformin to 1 g twice daily, this was previously reduced because of renal dysfunction  HIGHEST blood sugars appear to be in the morning overall  However blood sugars are relatively better in the only evenings and even with taking no Toujeo for at least a week his blood sugars are not markedly higher  His mealtimes and daily routine is quite variable  With his meter unable to verify which readings are after meals  From his last visit he was told to increase his Toujeo to 80 units twice daily but he only increased the morning dose  Now says that he is not eating much and recently has lost 6 pounds  Continues to take NovoLog 30 units with each meal 3 times a day evening without needing a full meal but blood sugar recently only 97      Side effects from medications:  urgency, frequent urination from Invokana Proper timing of medications in relation to meals: Yes.         Monitors blood glucose:  2-3 + times a day  Glucometer:   Accu-Chek   PRE-MEAL Fasting Lunch Dinner Bedtime Overall  Glucose range:  136-239  133-230  97-163    Mean/median:  181   140   167   POST-MEAL PC Breakfast PC Lunch PC Dinner  Glucose range:    136-199  Mean/median:    155   Previous readings:  PRE-MEAL Fasting Lunch  after noon Bedtime Overall  Glucose range:  101-206     87-260  Mean/median:  150  190  165   157   POST-MEAL PC Breakfast PC Lunch PC Dinner  Glucose range:    106-171  Mean/median:   145          Meals: Breakfast variable, 9-10 AM Lunch 2-3pm  usually avoiding high-fat meals, dinner at about 6 pm         Physical activity: exercise: Not doing any formal walking currently  Dietician visit: Most recent: 2013    Weight control:  Wt Readings from Last 3 Encounters:  07/15/18 194 lb (88 kg)  06/25/18 200 lb (90.7 kg)  05/28/18 200 lb (90.7 kg)          Diabetes labs:  Lab Results  Component Value Date  HGBA1C 7.3 (A) 06/25/2018   HGBA1C 8.1 (H) 05/14/2018   HGBA1C 7.1 (A) 02/15/2018   Lab Results  Component Value Date   MICROALBUR 0.9 02/15/2018   LDLCALC 90 03/18/2018   CREATININE 1.02 07/09/2018   Lab Results  Component Value Date   FRUCTOSAMINE 295 (H) 07/09/2018   FRUCTOSAMINE 309 (H) 02/13/2018   FRUCTOSAMINE 270 06/13/2017   FRUCTOSAMINE 353 (H) 11/10/2016   FRUCTOSAMINE 293 (H) 06/22/2016    Other active problems discussed today: See review of systems   Lab on 07/09/2018  Component Date Value Ref Range Status  . Fructosamine 07/09/2018 295* 0 - 285 umol/L Final   Comment: Published reference interval for apparently healthy subjects between age 88 and 43 is 74 - 285 umol/L and in a poorly controlled diabetic population is 228 - 563 umol/L with a mean of 396 umol/L.   Marland Kitchen Sodium 07/09/2018 140  135 - 145 mEq/L Final  . Potassium 07/09/2018 4.0  3.5 - 5.1 mEq/L Final  . Chloride 07/09/2018 101  96 - 112 mEq/L Final   . CO2 07/09/2018 31  19 - 32 mEq/L Final  . Glucose, Bld 07/09/2018 161* 70 - 99 mg/dL Final  . BUN 07/09/2018 9  6 - 23 mg/dL Final  . Creatinine, Ser 07/09/2018 1.02  0.40 - 1.50 mg/dL Final  . Calcium 07/09/2018 9.6  8.4 - 10.5 mg/dL Final  . GFR 07/09/2018 89.94  >60.00 mL/min Final     Allergies as of 07/15/2018      Reactions   Benazepril Cough   Pravastatin Other (See Comments)   myositis   Glipizide Nausea Only      Medication List       Accurate as of July 15, 2018 11:44 AM. If you have any questions, ask your nurse or doctor.        albuterol 108 (90 Base) MCG/ACT inhaler Commonly known as: VENTOLIN HFA Inhale 2 puffs into the lungs every 6 (six) hours as needed for wheezing or shortness of breath.   aspirin EC 81 MG tablet Take 81 mg by mouth every morning.   B-D SINGLE USE SWABS REGULAR Pads Inject 1 Act into the skin See admin instructions.   B-D ULTRAFINE III SHORT PEN 31G X 8 MM Misc Generic drug: Insulin Pen Needle USE AS DIRECTED FIVE TIMES A DAY   cetirizine 10 MG tablet Commonly known as: ZYRTEC Take 10 mg by mouth daily.   Cholecalciferol 50 MCG (2000 UT) Tabs Take 1 tablet (2,000 Units total) by mouth daily.   DULoxetine 60 MG capsule Commonly known as: CYMBALTA TAKE 1 CAPSULE EVERY DAY   Eszopiclone 3 MG Tabs Take 1 tablet (3 mg total) by mouth at bedtime. Take immediately before bedtime   furosemide 20 MG tablet Commonly known as: LASIX Take 1 tablet (20 mg total) by mouth 2 (two) times daily.   gabapentin 300 MG capsule Commonly known as: NEURONTIN Take 1 capsule (300 mg total) by mouth 3 (three) times daily.   glucose blood test strip Commonly known as: Cool Blood Glucose Test Strips Check sugar 3-4 times per day. DX: E11.8   Glycopyrrolate-Formoterol 9-4.8 MCG/ACT Aero Commonly known as: Bevespi Aerosphere Inhale 2 puffs into the lungs 2 (two) times daily.   insulin aspart 100 UNIT/ML FlexPen Commonly known as: NovoLOG  FlexPen 25 units before each meal What changed:   how much to take  additional instructions   Insulin Glargine (2 Unit Dial) 300 UNIT/ML Sopn Commonly known as: Toujeo  Max SoloStar Inject 80 Units into the skin 2 (two) times a day.   Lancets Misc Use to test blood sugar tid. DX: E11.8   linaclotide 72 MCG capsule Commonly known as: Linzess Take 1 capsule (72 mcg total) by mouth daily before breakfast.   metFORMIN 500 MG 24 hr tablet Commonly known as: GLUCOPHAGE-XR TAKE 2 TABLETS BY MOUTH TWICE DAILY   Nebivolol HCl 20 MG Tabs Commonly known as: Bystolic Take 1 tablet (20 mg total) by mouth daily.   Odefsey 200-25-25 MG Tabs tablet Generic drug: emtricitabine-rilpivir-tenofovir AF TAKE 1 TABLET BY MOUTH EVERY DAY   olmesartan-hydrochlorothiazide 40-12.5 MG tablet Commonly known as: BENICAR HCT TAKE 1 TABLET BY MOUTH DAILY   pioglitazone 30 MG tablet Commonly known as: ACTOS TAKE 1 TABLET BY MOUTH ONCE DAILY.   trazodone 300 MG tablet Commonly known as: DESYREL TAKE 1 TABLET(300 MG) BY MOUTH AT BEDTIME   triamcinolone cream 0.5 % Commonly known as: KENALOG APPLY 1 APPLICATION TOPICALLY THREE TIME A DAY   True Metrix Air Glucose Meter w/Device Kit       Allergies:  Allergies  Allergen Reactions  . Benazepril Cough  . Pravastatin Other (See Comments)    myositis  . Glipizide Nausea Only    Past Medical History:  Diagnosis Date  . Anxiety   . Cataract    right   . Depression   . Diabetes mellitus without complication ([place])   . DM (diabetes mellitus screen)   . HIV disease ([place]) 09/12/1995  . HIV infection ([place])   . Hypercholesterolemia   . Hyperlipidemia   . Hypertension     Past Surgical History:  Procedure Laterality Date  . COLONOSCOPY WITH PROPOFOL  01/16/2012   Procedure: COLONOSCOPY WITH PROPOFOL;  Surgeon: Garlan Fair, MD;  Location: [facility];  Service: Endoscopy;  Laterality: N/A;  . EYE SURGERY  2005   catarct surgery  .  optic lens surgery  2005    Family History  Problem Relation Age of Onset  . Arthritis Mother   . Diabetes Mother   . Heart disease Mother   . Stroke Mother   . Arthritis Father   . Diabetes Father   . Heart disease Father     Social History:  reports that he quit smoking about 21 months ago. His smoking use included cigarettes. He smoked 0.25 packs per day. He has never used smokeless tobacco. He reports that he does not drink alcohol or use drugs.  Review of Systems:   Hypertension:  currently on Bystolic  and on Benicar HCT 1/2, previously on losartan, followed by PCP Also on Lasix as needed  BP Readings from Last 3 Encounters:  07/15/18 116/72  06/25/18 (!) 142/60  05/28/18 123/76   RENAL dysfunction: His creatinine in 5/20 was much higher than usual and unclear why His Benicar HCT was reduced to half a tablet and his renal function is back to normal now, he did not follow-up with his PCP as recommended  No history of taking OTC nonsteroidal drugs  Lab Results  Component Value Date   CREATININE 1.02 07/09/2018   CREATININE 1.87 (H) 05/14/2018   CREATININE 0.99 03/18/2018    Lipids: LDL is normal as of 3/20 followed by PCP   Lab Results  Component Value Date   CHOL 156 03/18/2018   HDL 42 03/18/2018   LDLCALC 90 03/18/2018   TRIG 137 03/18/2018   CHOLHDL 3.7 03/18/2018    He has had symptoms of neuropathy with  paresthesia in feet and legs, these are controlled with gabapentin 300 mg 3 times a day      LABS:  Lab on 07/09/2018  Component Date Value Ref Range Status  . Fructosamine 07/09/2018 295* 0 - 285 umol/L Final   Comment: Published reference interval for apparently healthy subjects between age 42 and 7 is 24 - 285 umol/L and in a poorly controlled diabetic population is 228 - 563 umol/L with a mean of 396 umol/L.   Marland Kitchen Sodium 07/09/2018 140  135 - 145 mEq/L Final  . Potassium 07/09/2018 4.0  3.5 - 5.1 mEq/L Final  . Chloride 07/09/2018 101   96 - 112 mEq/L Final  . CO2 07/09/2018 31  19 - 32 mEq/L Final  . Glucose, Bld 07/09/2018 161* 70 - 99 mg/dL Final  . BUN 07/09/2018 9  6 - 23 mg/dL Final  . Creatinine, Ser 07/09/2018 1.02  0.40 - 1.50 mg/dL Final  . Calcium 07/09/2018 9.6  8.4 - 10.5 mg/dL Final  . GFR 07/09/2018 89.94  >60.00 mL/min Final     Examination:   BP 116/72 (BP Location: Left Arm, Patient Position: Sitting, Cuff Size: Normal)   Pulse 62   Ht 6' 1"  (1.854 m)   Wt 194 lb (88 kg)   SpO2 95%   BMI 25.60 kg/m   Body mass index is 25.6 kg/m.     ASSESSMENT/ PLAN:    Diabetes type 2:  See history of present illness for detailed discussion of his current management, blood sugar patterns and problems identified  His A1c is last month better at 7.3, previously at 8.1, However has been as low as 5.9  His blood sugars are mildly increased recently from his not refilling his Toujeo However surprisingly his blood sugars are only modestly increased with stopping the 150 units a day of the Toujeo and only continuing mealtime insulin With his decreased weight and appetite he may be requiring less insulin now Also may be benefiting from restarting the full dose of metformin which had been reduced because of renal dysfunction previously  Since his fasting readings have not been significantly high he may not need the full dose of 150 units of Toujeo now  Recommendations:  Restart Toujeo and discussed that we may be able to start with once a day in the evenings and only 70 units to start with  He will check with the pharmacy about any problems with his refills  Continue full doses of metformin and also Actos as before  Check blood sugars at various times of the day and discussed checking some readings after meals also  Discussed fasting blood sugar targets of 90-130 and use of basal insulin to control overnight readings  He will continue to take the same dose of NovoLog but may need to reduce the dose for  smaller meals  Encouraged him to start walking regularly  With decreased appetite and depression asked him to follow-up with his treating physician   Hypertension  blood pressure is still low normal but since renal function is improved we will continue with 1/2 tablet of Benicar HCT  Renal function: Better now   Patient Instructions  Toujeo 70 units in pm daily once and go up or down 10 units to keep am sugar 90-130  Check blood sugars on waking up 7 days a week  Also check blood sugars about 2 hours after meals and do this after different meals by rotation  Recommended blood sugar levels on waking up  are 90-130 and about 2 hours after meal is 130-160  Please bring your blood sugar monitor to each visit, thank you      Counseling time on subjects discussed in assessment and plan sections is over 50% of today's 25 minute visit    Elayne Snare 07/15/2018, 11:44 AM         ..

## 2018-07-15 NOTE — Patient Instructions (Addendum)
Toujeo 70 units in pm daily once and go up or down 10 units to keep am sugar 90-130  Check blood sugars on waking up 7 days a week  Also check blood sugars about 2 hours after meals and do this after different meals by rotation  Recommended blood sugar levels on waking up are 90-130 and about 2 hours after meal is 130-160  Please bring your blood sugar monitor to each visit, thank you

## 2018-07-16 ENCOUNTER — Ambulatory Visit: Payer: Medicare HMO | Admitting: Endocrinology

## 2018-07-19 ENCOUNTER — Other Ambulatory Visit: Payer: Self-pay | Admitting: Internal Medicine

## 2018-07-19 DIAGNOSIS — I10 Essential (primary) hypertension: Secondary | ICD-10-CM

## 2018-08-26 ENCOUNTER — Other Ambulatory Visit: Payer: Self-pay | Admitting: Internal Medicine

## 2018-08-26 DIAGNOSIS — I10 Essential (primary) hypertension: Secondary | ICD-10-CM

## 2018-08-27 ENCOUNTER — Other Ambulatory Visit: Payer: Self-pay | Admitting: Internal Medicine

## 2018-08-27 DIAGNOSIS — E1165 Type 2 diabetes mellitus with hyperglycemia: Secondary | ICD-10-CM

## 2018-08-27 DIAGNOSIS — Z794 Long term (current) use of insulin: Secondary | ICD-10-CM

## 2018-08-27 DIAGNOSIS — E1140 Type 2 diabetes mellitus with diabetic neuropathy, unspecified: Secondary | ICD-10-CM

## 2018-08-27 MED ORDER — ACCU-CHEK GUIDE VI STRP: ORAL_STRIP | 2 refills | Status: DC

## 2018-09-02 ENCOUNTER — Other Ambulatory Visit: Payer: Self-pay | Admitting: Endocrinology

## 2018-09-02 MED ORDER — BD PEN NEEDLE SHORT U/F 31G X 8 MM MISC: 1 refills | Status: DC

## 2018-09-09 ENCOUNTER — Other Ambulatory Visit: Payer: Self-pay | Admitting: Internal Medicine

## 2018-09-09 DIAGNOSIS — Z794 Long term (current) use of insulin: Secondary | ICD-10-CM

## 2018-09-09 DIAGNOSIS — E1140 Type 2 diabetes mellitus with diabetic neuropathy, unspecified: Secondary | ICD-10-CM

## 2018-09-09 DIAGNOSIS — I10 Essential (primary) hypertension: Secondary | ICD-10-CM

## 2018-09-13 ENCOUNTER — Other Ambulatory Visit: Payer: Self-pay

## 2018-09-13 ENCOUNTER — Other Ambulatory Visit (INDEPENDENT_AMBULATORY_CARE_PROVIDER_SITE_OTHER): Payer: Medicare HMO

## 2018-09-13 DIAGNOSIS — E1165 Type 2 diabetes mellitus with hyperglycemia: Secondary | ICD-10-CM | POA: Diagnosis not present

## 2018-09-13 DIAGNOSIS — Z794 Long term (current) use of insulin: Secondary | ICD-10-CM

## 2018-09-13 LAB — BASIC METABOLIC PANEL
BUN: 10 mg/dL (ref 6–23)
CO2: 33 mEq/L — ABNORMAL HIGH (ref 19–32)
Calcium: 10 mg/dL (ref 8.4–10.5)
Chloride: 101 mEq/L (ref 96–112)
Creatinine, Ser: 1.11 mg/dL (ref 0.40–1.50)
GFR: 81.53 mL/min (ref >60.00)
Glucose, Bld: 77 mg/dL (ref 70–99)
Potassium: 4.4 mEq/L (ref 3.5–5.1)
Sodium: 140 mEq/L (ref 135–145)

## 2018-09-13 LAB — HEMOGLOBIN A1C: Hgb A1c MFr Bld: 7 % — ABNORMAL HIGH (ref 4.6–6.5)

## 2018-09-13 NOTE — Patient Instructions (Signed)
[id number] 

## 2018-09-14 LAB — FRUCTOSAMINE: Fructosamine: 276 umol/L (ref 0–285)

## 2018-09-18 ENCOUNTER — Encounter: Payer: Self-pay | Admitting: Endocrinology

## 2018-09-18 ENCOUNTER — Ambulatory Visit (INDEPENDENT_AMBULATORY_CARE_PROVIDER_SITE_OTHER): Payer: Medicare HMO | Admitting: Endocrinology

## 2018-09-18 ENCOUNTER — Other Ambulatory Visit: Payer: Self-pay

## 2018-09-18 VITALS — BP 130/60 | HR 61 | Ht 73.0 in | Wt 194.6 lb

## 2018-09-18 DIAGNOSIS — Z794 Long term (current) use of insulin: Secondary | ICD-10-CM | POA: Diagnosis not present

## 2018-09-18 DIAGNOSIS — E1140 Type 2 diabetes mellitus with diabetic neuropathy, unspecified: Secondary | ICD-10-CM

## 2018-09-18 NOTE — Progress Notes (Signed)
Patient ID: Nicholas Whitehead, male   DOB: 1955-04-18, 63 y.o.   MRN: [id number]   Reason for Appointment: Type II Diabetes follow-up   History of Present Illness   Diagnosis date: 1998    Previous history: He was started on oral agents at diagnosis and around 2006 was put on insulin His blood sugars are usually well controlled when he takes Actos along with his insulin. He had been on Invokana since 04/2013  He requires large doses of insulin especially basal insulin   Recent history:   Insulin regimen: Toujeo 70 units p.m.;  Novolog 30 AC tid         Oral hypoglycemic drugs: Metformin ER 1000 mg twice a day, Actos 30 mg   A1c is further improved at 7%  Current management, blood sugar patterns and problems:  He was told to try Toujeo only once a day since we will without taking this his morning sugars were not consistently high  However he said that sometimes if his blood sugar is over 140 in the morning or what ever he considers high he will take another injection of 70 units  With this he has had one episode of hypoglycemic symptoms at 5 AM  Overall blood sugars are better  He has checked some readings throughout the day but not consistently and not clear which readings are after meals  He is usually taking his NovoLog without adjusting it based on what he is eating out the blood sugar level  Lately not getting significant protein at breakfast except with regular yogurt, is also eating fruit and oatmeal  Only occasionally may do a lot of walking if the weather allows  Weight is about the same     Side effects from medications:  urgency, frequent urination from Invokana Proper timing of medications in relation to meals: Yes.          Monitors blood glucose:  2-3 + times a day  Glucometer:   Accu-Chek   PRE-MEAL Fasting Lunch Dinner Bedtime Overall  Glucose range:  112-198    123-192   Mean/median:  150  148  144  146  145   Previous readings:    PRE-MEAL Fasting  lunch Dinner Bedtime Overall  Glucose range:  136-239  133-230  97-163    Mean/median:  181   140   167   POST-MEAL PC Breakfast PC Lunch PC Dinner  Glucose range:    136-199  Mean/median:    155           Meals: Breakfast variable, 9-10 AM Lunch 2-3pm  usually avoiding high-fat meals, dinner at about 6 pm         Physical activity: exercise: doing any formal walking 2/7  Dietician visit: Most recent: 2013    Weight control:  Wt Readings from Last 3 Encounters:  09/18/18 194 lb 9.6 oz (88.3 kg)  07/15/18 194 lb (88 kg)  06/25/18 200 lb (90.7 kg)          Diabetes labs:  Lab Results  Component Value Date   HGBA1C 7.0 (H) 09/13/2018   HGBA1C 7.3 (A) 06/25/2018   HGBA1C 8.1 (H) 05/14/2018   Lab Results  Component Value Date   MICROALBUR 0.9 02/15/2018   LDLCALC 90 03/18/2018   CREATININE 1.11 09/13/2018   Lab Results  Component Value Date   FRUCTOSAMINE 276 09/13/2018   FRUCTOSAMINE 295 (H) 07/09/2018   FRUCTOSAMINE 309 (H) 02/13/2018   FRUCTOSAMINE 270 06/13/2017  FRUCTOSAMINE 353 (H) 11/10/2016    Other active problems discussed today: See review of systems   Lab on 09/13/2018  Component Date Value Ref Range Status  . Hgb A1c MFr Bld 09/13/2018 7.0* 4.6 - 6.5 % Final   Glycemic Control Guidelines for People with Diabetes:Non Diabetic:  <6%Goal of Therapy: <7%Additional Action Suggested:  >8%   . Sodium 09/13/2018 140  135 - 145 mEq/L Final  . Potassium 09/13/2018 4.4  3.5 - 5.1 mEq/L Final  . Chloride 09/13/2018 101  96 - 112 mEq/L Final  . CO2 09/13/2018 33* 19 - 32 mEq/L Final  . Glucose, Bld 09/13/2018 77  70 - 99 mg/dL Final  . BUN 09/13/2018 10  6 - 23 mg/dL Final  . Creatinine, Ser 09/13/2018 1.11  0.40 - 1.50 mg/dL Final  . Calcium 09/13/2018 10.0  8.4 - 10.5 mg/dL Final  . GFR 09/13/2018 81.53  >60.00 mL/min Final  . Fructosamine 09/13/2018 276  0 - 285 umol/L Final   Comment: Published reference interval for apparently  healthy subjects between age 60 and 73 is 74 - 285 umol/L and in a poorly controlled diabetic population is 228 - 563 umol/L with a mean of 396 umol/L.      Allergies as of 09/18/2018      Reactions   Benazepril Cough   Pravastatin Other (See Comments)   myositis   Glipizide Nausea Only      Medication List       Accurate as of September 18, 2018 11:40 AM. If you have any questions, ask your nurse or doctor.        Accu-Chek Guide test strip Generic drug: glucose blood Use QID   albuterol 108 (90 Base) MCG/ACT inhaler Commonly known as: VENTOLIN HFA Inhale 2 puffs into the lungs every 6 (six) hours as needed for wheezing or shortness of breath.   aspirin EC 81 MG tablet Take 81 mg by mouth every morning.   B-D SINGLE USE SWABS REGULAR Pads Inject 1 Act into the skin See admin instructions.   B-D ULTRAFINE III SHORT PEN 31G X 8 MM Misc Generic drug: Insulin Pen Needle Use to inject insulin. DX N46.2   Bystolic 20 MG Tabs Generic drug: Nebivolol HCl TAKE 1 TABLET EVERY DAY   cetirizine 10 MG tablet Commonly known as: ZYRTEC Take 10 mg by mouth daily.   Cholecalciferol 50 MCG (2000 UT) Tabs Take 1 tablet (2,000 Units total) by mouth daily.   DULoxetine 60 MG capsule Commonly known as: CYMBALTA TAKE 1 CAPSULE EVERY DAY   Eszopiclone 3 MG Tabs Take 1 tablet (3 mg total) by mouth at bedtime. Take immediately before bedtime   furosemide 20 MG tablet Commonly known as: LASIX TAKE 1 TABLET TWICE DAILY   gabapentin 300 MG capsule Commonly known as: NEURONTIN Take 1 capsule (300 mg total) by mouth 3 (three) times daily.   Glycopyrrolate-Formoterol 9-4.8 MCG/ACT Aero Commonly known as: Airline pilot Inhale 2 puffs into the lungs 2 (two) times daily.   insulin aspart 100 UNIT/ML FlexPen Commonly known as: NovoLOG FlexPen 25 units before each meal What changed:   how much to take  when to take this  additional instructions   Lancets Misc Use to  test blood sugar tid. DX: E11.8   linaclotide 72 MCG capsule Commonly known as: Linzess Take 1 capsule (72 mcg total) by mouth daily before breakfast.   metFORMIN 500 MG 24 hr tablet Commonly known as: GLUCOPHAGE-XR TAKE 2 TABLETS BY MOUTH  TWICE DAILY   Odefsey 200-25-25 MG Tabs tablet Generic drug: emtricitabine-rilpivir-tenofovir AF TAKE 1 TABLET BY MOUTH EVERY DAY   olmesartan-hydrochlorothiazide 40-12.5 MG tablet Commonly known as: BENICAR HCT TAKE 1 TABLET EVERY DAY   pioglitazone 30 MG tablet Commonly known as: ACTOS TAKE 1 TABLET BY MOUTH ONCE DAILY.   Toujeo Max SoloStar 300 UNIT/ML Sopn Generic drug: Insulin Glargine (2 Unit Dial) Inject 70 Units into the skin daily.   trazodone 300 MG tablet Commonly known as: DESYREL TAKE 1 TABLET(300 MG) BY MOUTH AT BEDTIME   triamcinolone cream 0.5 % Commonly known as: KENALOG APPLY 1 APPLICATION TOPICALLY THREE TIME A DAY   True Metrix Air Glucose Meter w/Device Kit       Allergies:  Allergies  Allergen Reactions  . Benazepril Cough  . Pravastatin Other (See Comments)    myositis  . Glipizide Nausea Only    Past Medical History:  Diagnosis Date  . Anxiety   . Cataract    right   . Depression   . Diabetes mellitus without complication ([place])   . DM (diabetes mellitus screen)   . HIV disease ([place]) 09/12/1995  . HIV infection ([place])   . Hypercholesterolemia   . Hyperlipidemia   . Hypertension     Past Surgical History:  Procedure Laterality Date  . COLONOSCOPY WITH PROPOFOL  01/16/2012   Procedure: COLONOSCOPY WITH PROPOFOL;  Surgeon: Garlan Fair, MD;  Location: [facility];  Service: Endoscopy;  Laterality: N/A;  . EYE SURGERY  2005   catarct surgery  . optic lens surgery  2005    Family History  Problem Relation Age of Onset  . Arthritis Mother   . Diabetes Mother   . Heart disease Mother   . Stroke Mother   . Arthritis Father   . Diabetes Father   . Heart disease Father     Social  History:  reports that he quit smoking about 1 years ago. His smoking use included cigarettes. He smoked 0.25 packs per day. He has never used smokeless tobacco. He reports that he does not drink alcohol or use drugs.  Review of Systems:   Hypertension:  currently on Bystolic  and on Benicar HCT 1/2, previously on losartan, followed by PCP Also on Lasix as needed  BP Readings from Last 3 Encounters:  09/18/18 130/60  07/15/18 116/72  06/25/18 (!) 142/60   RENAL dysfunction: His creatinine in 5/20 was much higher than usual and unclear why His Benicar HCT was reduced to half a tablet and his renal function is back to normal now, he did not follow-up with his PCP as recommended  No history of taking OTC nonsteroidal drugs  Lab Results  Component Value Date   CREATININE 1.11 09/13/2018   CREATININE 1.02 07/09/2018   CREATININE 1.87 (H) 05/14/2018    Lipids: LDL is normal as of 3/20 followed by PCP   Lab Results  Component Value Date   CHOL 156 03/18/2018   HDL 42 03/18/2018   LDLCALC 90 03/18/2018   TRIG 137 03/18/2018   CHOLHDL 3.7 03/18/2018    He has had symptoms of neuropathy with paresthesia in feet and legs, these are controlled with gabapentin 300 mg 3 times a day      LABS:  Lab on 09/13/2018  Component Date Value Ref Range Status  . Hgb A1c MFr Bld 09/13/2018 7.0* 4.6 - 6.5 % Final   Glycemic Control Guidelines for People with Diabetes:Non Diabetic:  <6%Goal of Therapy: <7%Additional Action  Suggested:  >8%   . Sodium 09/13/2018 140  135 - 145 mEq/L Final  . Potassium 09/13/2018 4.4  3.5 - 5.1 mEq/L Final  . Chloride 09/13/2018 101  96 - 112 mEq/L Final  . CO2 09/13/2018 33* 19 - 32 mEq/L Final  . Glucose, Bld 09/13/2018 77  70 - 99 mg/dL Final  . BUN 09/13/2018 10  6 - 23 mg/dL Final  . Creatinine, Ser 09/13/2018 1.11  0.40 - 1.50 mg/dL Final  . Calcium 09/13/2018 10.0  8.4 - 10.5 mg/dL Final  . GFR 09/13/2018 81.53  >60.00 mL/min Final  . Fructosamine  09/13/2018 276  0 - 285 umol/L Final   Comment: Published reference interval for apparently healthy subjects between age 55 and 36 is 62 - 285 umol/L and in a poorly controlled diabetic population is 228 - 563 umol/L with a mean of 396 umol/L.      Examination:   BP 130/60 (BP Location: Left Arm, Patient Position: Sitting, Cuff Size: Normal)   Pulse 61   Ht 6' 1"  (1.854 m)   Wt 194 lb 9.6 oz (88.3 kg)   SpO2 94%   BMI 25.67 kg/m   Body mass index is 25.67 kg/m.     ASSESSMENT/ PLAN:    Diabetes type 2:  See history of present illness for detailed discussion of his current management, blood sugar patterns and problems identified  His A1c is 7% and improving  He now appears to be needing about 70 units basal instead of 150 units that he was doing before Fasting readings are mildly increased but variable However he does not understand how this works and sometimes takes twice as much insulin in a day even when the blood sugars are only mildly increased in the morning Reviewed his diet  Recommendations: His Toujeo needs to be taken only once a day Since he has mostly high readings in the mornings on an average he will take 80 units instead of 70 He does need to add a little more protein and less fruit at breakfast If his blood sugars are low normal in the morning he can reduce the NovoLog to 25 units Also may reduce the dose for smaller meals Encouraged him to walk regularly   Hypertension  blood pressure is not as low and he can continue half tablet of Benicar HCTZ  Renal function: Now consistently normal   Patient Instructions  Toujeo 80 every pm only 1x daily  Novolog 25-30 at meals, less in am if sugar below 120 or small meals        Shelbe Haglund 09/18/2018, 11:40 AM           ..

## 2018-09-18 NOTE — Patient Instructions (Addendum)
Toujeo 80 every pm only 1x daily  Novolog 25-30 at meals, less in am if sugar below 120 or small meals

## 2018-09-20 ENCOUNTER — Other Ambulatory Visit: Payer: Self-pay

## 2018-09-20 DIAGNOSIS — I10 Essential (primary) hypertension: Secondary | ICD-10-CM

## 2018-09-20 MED ORDER — BYSTOLIC 20 MG PO TABS: 1.0000 | ORAL_TABLET | Freq: Every day | ORAL | 0 refills | Status: DC

## 2018-09-20 MED ORDER — BYSTOLIC 20 MG PO TABS: 1.0000 | ORAL_TABLET | Freq: Every day | ORAL | 1 refills | Status: DC

## 2018-09-25 ENCOUNTER — Encounter: Payer: Self-pay | Admitting: Internal Medicine

## 2018-09-25 ENCOUNTER — Other Ambulatory Visit: Payer: Self-pay

## 2018-09-25 ENCOUNTER — Ambulatory Visit (INDEPENDENT_AMBULATORY_CARE_PROVIDER_SITE_OTHER): Payer: Medicare HMO | Admitting: Internal Medicine

## 2018-09-25 VITALS — BP 132/62 | HR 61 | Temp 98.4°F | Ht 73.0 in | Wt 193.0 lb

## 2018-09-25 DIAGNOSIS — F329 Major depressive disorder, single episode, unspecified: Secondary | ICD-10-CM

## 2018-09-25 DIAGNOSIS — Z794 Long term (current) use of insulin: Secondary | ICD-10-CM | POA: Diagnosis not present

## 2018-09-25 DIAGNOSIS — Z23 Encounter for immunization: Secondary | ICD-10-CM

## 2018-09-25 DIAGNOSIS — E1140 Type 2 diabetes mellitus with diabetic neuropathy, unspecified: Secondary | ICD-10-CM | POA: Diagnosis not present

## 2018-09-25 DIAGNOSIS — I10 Essential (primary) hypertension: Secondary | ICD-10-CM | POA: Diagnosis not present

## 2018-09-25 DIAGNOSIS — F419 Anxiety disorder, unspecified: Secondary | ICD-10-CM

## 2018-09-25 DIAGNOSIS — F32A Depression, unspecified: Secondary | ICD-10-CM

## 2018-09-25 MED ORDER — TRAZODONE HCL 300 MG PO TABS: ORAL_TABLET | ORAL | 3 refills | Status: DC

## 2018-09-25 MED ORDER — BYSTOLIC 20 MG PO TABS: 1.0000 | ORAL_TABLET | Freq: Every day | ORAL | 1 refills | Status: DC

## 2018-09-25 NOTE — Progress Notes (Signed)
Subjective:  Patient ID: Nicholas Whitehead, male    DOB: [birth year]  Age: 63 y.o. MRN: [id number]  CC: Hypertension and Diabetes   HPI Nicholas Whitehead presents for f/up - He feels well and offers no new complaints.  He continues to struggle with loneliness and sadness.  He denies any recent episodes of CP, DOE, cough, shortness of breath, edema, or palpitations.  Outpatient Medications Prior to Visit  Medication Sig Dispense Refill  . albuterol (PROVENTIL HFA;VENTOLIN HFA) 108 (90 Base) MCG/ACT inhaler Inhale 2 puffs into the lungs every 6 (six) hours as needed for wheezing or shortness of breath. 1 Inhaler 5  . Alcohol Swabs (B-D SINGLE USE SWABS REGULAR) PADS Inject 1 Act into the skin See admin instructions. 300 each 1  . aspirin EC 81 MG tablet Take 81 mg by mouth every morning.    . Blood Glucose Monitoring Suppl (TRUE METRIX AIR GLUCOSE METER) w/Device KIT     . cetirizine (ZYRTEC) 10 MG tablet Take 10 mg by mouth daily.    . Cholecalciferol 2000 units TABS Take 1 tablet (2,000 Units total) by mouth daily. 90 tablet 1  . DULoxetine (CYMBALTA) 60 MG capsule TAKE 1 CAPSULE EVERY DAY 90 capsule 1  . Eszopiclone 3 MG TABS Take 1 tablet (3 mg total) by mouth at bedtime. Take immediately before bedtime 90 tablet 1  . furosemide (LASIX) 20 MG tablet TAKE 1 TABLET TWICE DAILY 180 tablet 1  . gabapentin (NEURONTIN) 300 MG capsule Take 1 capsule (300 mg total) by mouth 3 (three) times daily. 270 capsule 1  . glucose blood (ACCU-CHEK GUIDE) test strip Use QID 300 each 2  . Glycopyrrolate-Formoterol (BEVESPI AEROSPHERE) 9-4.8 MCG/ACT AERO Inhale 2 puffs into the lungs 2 (two) times daily. 3 Inhaler 1  . insulin aspart (NOVOLOG FLEXPEN) 100 UNIT/ML FlexPen 25 units before each meal (Patient taking differently: 30 Units 2 (two) times daily. 30 units twice daily.) 30 mL 1  . Insulin Glargine, 2 Unit Dial, (TOUJEO MAX SOLOSTAR) 300 UNIT/ML SOPN Inject 70 Units into the skin daily. 9 mL 1  . Insulin Pen  Needle (B-D ULTRAFINE III SHORT PEN) 31G X 8 MM MISC Use to inject insulin. DX E11.8 450 each 1  . Lancets MISC Use to test blood sugar tid. DX: E11.8 300 each 3  . linaclotide (LINZESS) 72 MCG capsule Take 1 capsule (72 mcg total) by mouth daily before breakfast. 90 capsule 1  . metFORMIN (GLUCOPHAGE-XR) 500 MG 24 hr tablet TAKE 2 TABLETS BY MOUTH TWICE DAILY 360 tablet 1  . ODEFSEY 200-25-25 MG TABS tablet TAKE 1 TABLET BY MOUTH EVERY DAY 30 tablet 5  . olmesartan-hydrochlorothiazide (BENICAR HCT) 40-12.5 MG tablet TAKE 1 TABLET EVERY DAY 90 tablet 1  . pioglitazone (ACTOS) 30 MG tablet TAKE 1 TABLET BY MOUTH ONCE DAILY. 90 tablet 1  . triamcinolone cream (KENALOG) 0.5 % APPLY 1 APPLICATION TOPICALLY THREE TIME A DAY 100 g 1  . Nebivolol HCl (BYSTOLIC) 20 MG TABS Take 1 tablet (20 mg total) by mouth daily. 30 tablet 0  . trazodone (DESYREL) 300 MG tablet TAKE 1 TABLET(300 MG) BY MOUTH AT BEDTIME 90 tablet 3   No facility-administered medications prior to visit.     ROS Review of Systems  Constitutional: Negative.  Negative for chills, diaphoresis, fatigue and fever.  HENT: Negative.   Eyes: Negative for visual disturbance.  Respiratory: Negative for cough, chest tightness, shortness of breath and wheezing.   Cardiovascular: Negative for chest pain,  palpitations and leg swelling.  Gastrointestinal: Negative for abdominal pain, constipation, diarrhea, nausea and vomiting.  Endocrine: Negative.   Genitourinary: Negative.  Negative for difficulty urinating.  Musculoskeletal: Negative.  Negative for arthralgias and myalgias.  Skin: Negative.  Negative for color change and pallor.  Neurological: Negative for dizziness, weakness and light-headedness.  Hematological: Negative for adenopathy. Does not bruise/bleed easily.  Psychiatric/Behavioral: Negative.     Objective:  BP 132/62 (BP Location: Left Arm, Patient Position: Sitting, Cuff Size: Normal)   Pulse 61   Temp 98.4 F (36.9 C)  (Oral)   Ht 6' 1"  (1.854 m)   Wt 193 lb (87.5 kg)   SpO2 96%   BMI 25.46 kg/m   BP Readings from Last 3 Encounters:  09/25/18 132/62  09/18/18 130/60  07/15/18 116/72    Wt Readings from Last 3 Encounters:  09/25/18 193 lb (87.5 kg)  09/18/18 194 lb 9.6 oz (88.3 kg)  07/15/18 194 lb (88 kg)    Physical Exam Vitals signs reviewed.  HENT:     Nose: Nose normal.     Mouth/Throat:     Mouth: Mucous membranes are moist.     Pharynx: No oropharyngeal exudate.  Eyes:     Conjunctiva/sclera: Conjunctivae normal.  Neck:     Musculoskeletal: Normal range of motion and neck supple.  Cardiovascular:     Rate and Rhythm: Normal rate and regular rhythm.     Heart sounds: No murmur.  Pulmonary:     Effort: Pulmonary effort is normal.     Breath sounds: No stridor. No wheezing, rhonchi or rales.  Abdominal:     General: Abdomen is flat. Bowel sounds are normal. There is no distension.     Palpations: There is no hepatomegaly or splenomegaly.     Tenderness: There is no abdominal tenderness.  Musculoskeletal: Normal range of motion.     Right lower leg: No edema.     Left lower leg: No edema.  Lymphadenopathy:     Cervical: No cervical adenopathy.  Skin:    General: Skin is warm and dry.     Findings: No rash.  Neurological:     General: No focal deficit present.  Psychiatric:        Mood and Affect: Mood normal.        Behavior: Behavior normal.     Lab Results  Component Value Date   WBC 5.6 03/18/2018   HGB 15.3 03/18/2018   HCT 44.4 03/18/2018   PLT 166 03/18/2018   GLUCOSE 77 09/13/2018   CHOL 156 03/18/2018   TRIG 137 03/18/2018   HDL 42 03/18/2018   LDLCALC 90 03/18/2018   ALT 13 03/18/2018   AST 15 03/18/2018   NA 140 09/13/2018   K 4.4 09/13/2018   CL 101 09/13/2018   CREATININE 1.11 09/13/2018   BUN 10 09/13/2018   CO2 33 (H) 09/13/2018   TSH 1.74 10/23/2017   PSA 1.02 10/23/2017   HGBA1C 7.0 (H) 09/13/2018   MICROALBUR 0.9 02/15/2018    Dg  Abdomen 1 View  Result Date: 09/11/2016 CLINICAL DATA:  Abdominal pain.  Constipation. EXAM: ABDOMEN - 1 VIEW COMPARISON:  None. FINDINGS: No dilated small bowel loops. Mild gas and stool in the large bowel. No evidence of pneumatosis or pneumoperitoneum. No radiopaque urolithiasis. Mild lumbar spondylosis. IMPRESSION: Nonobstructive bowel gas pattern.  Mild colonic stool burden. Electronically Signed   By: Ilona Sorrel M.D.   On: 09/11/2016 14:28    Assessment & Plan:  Nicholas Whitehead was seen today for hypertension and diabetes.  Diagnoses and all orders for this visit:  Need for influenza vaccination -     Flu Vaccine QUAD 36+ mos IM  Type 2 diabetes mellitus with diabetic neuropathy, with long-term current use of insulin ([place])- He has achieved excellent glycemic control. -     HM Diabetes Foot Exam  Essential hypertension- His blood pressure is well controlled.  Will continue the current regimen. -     Nebivolol HCl (BYSTOLIC) 20 MG TABS; Take 1 tablet (20 mg total) by mouth daily.  Anxiety and depression- He wants to stay on the current dose of trazodone. -     trazodone (DESYREL) 300 MG tablet; TAKE 1 TABLET(300 MG) BY MOUTH AT BEDTIME   I am having Nicholas Whitehead maintain his cetirizine, aspirin EC, triamcinolone cream, albuterol, Cholecalciferol, True Metrix Air Glucose Meter, Lancets, Glycopyrrolate-Formoterol, pioglitazone, gabapentin, linaclotide, insulin aspart, Odefsey, DULoxetine, metFORMIN, B-D SINGLE USE SWABS REGULAR, Eszopiclone, furosemide, Accu-Chek Guide, Toujeo Max SoloStar, B-D ULTRAFINE III SHORT PEN, olmesartan-hydrochlorothiazide, Bystolic, and trazodone.  Meds ordered this encounter  Medications  . Nebivolol HCl (BYSTOLIC) 20 MG TABS    Sig: Take 1 tablet (20 mg total) by mouth daily.    Dispense:  90 tablet    Refill:  1  . trazodone (DESYREL) 300 MG tablet    Sig: TAKE 1 TABLET(300 MG) BY MOUTH AT BEDTIME    Dispense:  90 tablet    Refill:  3     Follow-up:  Return in about 6 months (around 03/25/2019).  Scarlette Calico, MD

## 2018-09-25 NOTE — Patient Instructions (Signed)

## 2018-10-10 ENCOUNTER — Other Ambulatory Visit: Payer: Self-pay | Admitting: Endocrinology

## 2018-10-10 DIAGNOSIS — E1140 Type 2 diabetes mellitus with diabetic neuropathy, unspecified: Secondary | ICD-10-CM

## 2018-10-17 ENCOUNTER — Other Ambulatory Visit: Payer: Self-pay | Admitting: Internal Medicine

## 2018-10-17 DIAGNOSIS — J410 Simple chronic bronchitis: Secondary | ICD-10-CM

## 2018-10-21 ENCOUNTER — Telehealth: Payer: Self-pay

## 2018-10-21 DIAGNOSIS — Z794 Long term (current) use of insulin: Secondary | ICD-10-CM

## 2018-10-21 DIAGNOSIS — E1140 Type 2 diabetes mellitus with diabetic neuropathy, unspecified: Secondary | ICD-10-CM

## 2018-10-21 MED ORDER — PIOGLITAZONE HCL 30 MG PO TABS: ORAL_TABLET | ORAL | 1 refills | Status: DC

## 2018-10-21 MED ORDER — PIOGLITAZONE HCL 30 MG PO TABS: 30.0000 mg | ORAL_TABLET | Freq: Every day | ORAL | 0 refills | Status: DC

## 2018-10-21 NOTE — Telephone Encounter (Signed)
Pt called and stated that he hasn't received the actos. Erx sent to mail order and local.

## 2018-10-23 ENCOUNTER — Other Ambulatory Visit: Payer: Self-pay | Admitting: Internal Medicine

## 2018-10-23 DIAGNOSIS — E1140 Type 2 diabetes mellitus with diabetic neuropathy, unspecified: Secondary | ICD-10-CM

## 2018-10-23 DIAGNOSIS — Z794 Long term (current) use of insulin: Secondary | ICD-10-CM

## 2018-10-23 MED ORDER — PIOGLITAZONE HCL 30 MG PO TABS: 30.0000 mg | ORAL_TABLET | Freq: Every day | ORAL | 1 refills | Status: DC

## 2018-10-25 ENCOUNTER — Ambulatory Visit (INDEPENDENT_AMBULATORY_CARE_PROVIDER_SITE_OTHER): Payer: Medicare HMO | Admitting: *Deleted

## 2018-10-25 DIAGNOSIS — Z0000 Encounter for general adult medical examination without abnormal findings: Secondary | ICD-10-CM | POA: Diagnosis not present

## 2018-10-25 NOTE — Progress Notes (Addendum)
Subjective:   Nicholas Whitehead is a 63 y.o. male who presents for Medicare Annual/Subsequent preventive examination. I connected with patient by a telephone and verified that I am speaking with the correct person using two identifiers. Patient stated full name and DOB. Patient gave permission to continue with telephonic visit. Patient's location was at home and Nurse's location was at [place] office. Participants during this visit included patient and nurse.  Review of Systems:   Cardiac Risk Factors include: advanced age (>9mn, >>53women);diabetes mellitus;dyslipidemia;male gender;hypertension  Home Safety/Smoke Alarms: Feels safe in home. Smoke alarms in place.  Living environment; residence and FAdult nurse apartment, equipment[place] Lives with alone needs for DME, limited support system Seat Belt Safety/Bike Helmet: Wears seat belt.     Objective:    Vitals: There were no vitals taken for this visit.  There is no height or weight on file to calculate BMI.  Advanced Directives 10/25/2018 03/14/2018 02/25/2018 09/11/2016 08/22/2016 01/16/2012  Does Patient Have a Medical Advance Directive? Yes No No No;Yes Yes Patient has advance directive, copy not in chart  Type of Advance [place]ive [place] - - - [place] Living will  Copy of [place] Chart? No - copy requested - - - No - copy requested Copy requested from family  Would patient like information on creating a medical advance directive? - No - Patient declined No - Patient declined - - -  Pre-existing out of facility DNR order (yellow form or pink MOST form) - - - - - No    Tobacco Social History   Tobacco Use  Smoking Status Former Smoker  . Packs/day: 0.25  . Types: Cigarettes  . Quit date: 09/24/2016  . Years since quitting: 2.0  Smokeless Tobacco Never Used     Counseling given: Not Answered  Past Medical History:   Diagnosis Date  . Anxiety   . Cataract    right   . Depression   . Diabetes mellitus without complication ([place]   . DM (diabetes mellitus screen)   . HIV disease ([place] 09/12/1995  . HIV infection ([place]   . Hypercholesterolemia   . Hyperlipidemia   . Hypertension    Past Surgical History:  Procedure Laterality Date  . COLONOSCOPY WITH PROPOFOL  01/16/2012   Procedure: COLONOSCOPY WITH PROPOFOL;  Surgeon: MGarlan Fair MD;  Location: [facility];  Service: Endoscopy;  Laterality: N/A;  . EYE SURGERY  2005   catarct surgery  . optic lens surgery  2005   Family History  Problem Relation Age of Onset  . Arthritis Mother   . Diabetes Mother   . Heart disease Mother   . Stroke Mother   . Arthritis Father   . Diabetes Father   . Heart disease Father    Social History   Socioeconomic History  . Marital status: Single    Spouse name: Not on file  . Number of children: Not on file  . Years of education: Not on file  . Highest education level: Not on file  Occupational History  . Occupation: disability  Social Needs  . Financial resource strain: Not hard at all  . Food insecurity    Worry: Never true    Inability: Never true  . Transportation needs    Medical: No    Non-medical: No  Tobacco Use  . Smoking status: Former Smoker    Packs/day: 0.25    Types:  Cigarettes    Quit date: 09/24/2016    Years since quitting: 2.0  . Smokeless tobacco: Never Used  Substance and Sexual Activity  . Alcohol use: No    Alcohol/week: 0.0 standard drinks    Comment: socially  . Drug use: No    Comment: rarely  . Sexual activity: Never    Birth control/protection: Other-see comments    Comment: declined condoms  Lifestyle  . Physical activity    Days per week: 3 days    Minutes per session: 30 min  . Stress: Only a little  Relationships  . Social connections    Talks on phone: More than three times a week    Gets together: Three times a week    Attends religious service:  Not on file    Active member of club or organization: Not on file    Attends meetings of clubs or organizations: Not on file    Relationship status: Not on file  Other Topics Concern  . Not on file  Social History Narrative  . Not on file    Outpatient Encounter Medications as of 10/25/2018  Medication Sig  . albuterol (PROVENTIL HFA;VENTOLIN HFA) 108 (90 Base) MCG/ACT inhaler Inhale 2 puffs into the lungs every 6 (six) hours as needed for wheezing or shortness of breath.  . Alcohol Swabs (B-D SINGLE USE SWABS REGULAR) PADS Inject 1 Act into the skin See admin instructions.  Marland Kitchen aspirin EC 81 MG tablet Take 81 mg by mouth every morning.  Marland Kitchen BEVESPI AEROSPHERE 9-4.8 MCG/ACT AERO INHALE 2 PUFFS INTO THE LUNGS 2 (TWO) TIMES DAILY.  Marland Kitchen Blood Glucose Monitoring Suppl (TRUE METRIX AIR GLUCOSE METER) w/Device KIT   . cetirizine (ZYRTEC) 10 MG tablet Take 10 mg by mouth daily.  . Cholecalciferol 2000 units TABS Take 1 tablet (2,000 Units total) by mouth daily.  . DULoxetine (CYMBALTA) 60 MG capsule TAKE 1 CAPSULE EVERY DAY  . Eszopiclone 3 MG TABS Take 1 tablet (3 mg total) by mouth at bedtime. Take immediately before bedtime  . furosemide (LASIX) 20 MG tablet TAKE 1 TABLET TWICE DAILY  . gabapentin (NEURONTIN) 300 MG capsule Take 1 capsule (300 mg total) by mouth 3 (three) times daily.  Marland Kitchen glucose blood (ACCU-CHEK GUIDE) test strip Use QID  . Insulin Aspart FlexPen 100 UNIT/ML SOPN INJECT 25 UNITS UNDER THE SKIN BEFORE EACH MEAL (Patient taking differently: 30 Units. )  . Insulin Glargine, 2 Unit Dial, (TOUJEO MAX SOLOSTAR) 300 UNIT/ML SOPN Inject 70 Units into the skin daily.  . Insulin Pen Needle (B-D ULTRAFINE III SHORT PEN) 31G X 8 MM MISC Use to inject insulin. DX E11.8  . Lancets MISC Use to test blood sugar tid. DX: E11.8  . metFORMIN (GLUCOPHAGE-XR) 500 MG 24 hr tablet TAKE 2 TABLETS BY MOUTH TWICE DAILY  . Nebivolol HCl (BYSTOLIC) 20 MG TABS Take 1 tablet (20 mg total) by mouth daily.  .  ODEFSEY 200-25-25 MG TABS tablet TAKE 1 TABLET BY MOUTH EVERY DAY  . olmesartan-hydrochlorothiazide (BENICAR HCT) 40-12.5 MG tablet TAKE 1 TABLET EVERY DAY  . pioglitazone (ACTOS) 30 MG tablet Take 1 tablet (30 mg total) by mouth daily.  . trazodone (DESYREL) 300 MG tablet TAKE 1 TABLET(300 MG) BY MOUTH AT BEDTIME  . triamcinolone cream (KENALOG) 0.5 % APPLY 1 APPLICATION TOPICALLY THREE TIME A DAY  . linaclotide (LINZESS) 72 MCG capsule Take 1 capsule (72 mcg total) by mouth daily before breakfast. (Patient not taking: Reported on 10/25/2018)  No facility-administered encounter medications on file as of 10/25/2018.     Activities of Daily Living In your present state of health, do you have any difficulty performing the following activities: 10/25/2018 03/14/2018  Hearing? N N  Vision? N N  Difficulty concentrating or making decisions? N N  Walking or climbing stairs? N N  Dressing or bathing? N N  Doing errands, shopping? N N  Preparing Food and eating ? N N  Using the Toilet? N N  In the past six months, have you accidently leaked urine? N N  Do you have problems with loss of bowel control? N N  Managing your Medications? N N  Managing your Finances? N N  Housekeeping or managing your Housekeeping? N N  Some recent data might be hidden    Patient Care Team: [name], MD as PCP - General (Internal Medicine) [name], Okey Regal, MD as PCP - Infectious Diseases (Infectious Diseases) Merita Norton, LCSW as Counselor   Assessment:   This is a routine wellness examination for [place]. Physical assessment deferred to PCP.  Exercise Activities and Dietary recommendations Current Exercise Habits: Home exercise routine, Type of exercise: walking, Time (Minutes): 30, Frequency (Times/Week): 3, Weekly Exercise (Minutes/Week): 90, Intensity: Mild, Exercise limited by: orthopedic condition(s)  Diet (meal preparation, eat out, water intake, caffeinated beverages, dairy products, fruits and  vegetables): in general, a "healthy" diet     Reviewed heart healthy and diabetic diet. Encouraged patient to increase daily water and healthy fluid intake.     Goals    . Patient Stated    . Patient Stated     Maintain my health the very best I can.       Fall Risk Fall Risk  10/25/2018 05/28/2018 04/01/2018 03/14/2018 01/22/2018  Falls in the past year? 0 0 0 0 0  Comment - - - - -  Number falls in past yr: 0 - 1 0 0  Injury with Fall? 0 - 0 0 0  Risk Factor Category  - - - - -  Risk for fall due to : - - - - -  Follow up - - - - Falls evaluation completed     Depression Screen PHQ 2/9 Scores 10/25/2018 09/25/2018 05/28/2018 04/01/2018  PHQ - 2 Score _0 0  PHQ- 9 Score _1 -  Exception Documentation - - - Patient refusal    Cognitive Function MMSE - Mini Mental State Exam 10/25/2018  Not completed: Refused   Montreal Cognitive Assessment  06/28/2015  Visuospatial/ Executive (0/5) 3  Naming (0/3) 3  Attention: Read list of digits (0/2) 2  Attention: Read list of letters (0/1) 1  Attention: Serial 7 subtraction starting at 100 (0/3) 0  Language: Repeat phrase (0/2) 1  Language : Fluency (0/1) 1  Abstraction (0/2) 2  Delayed Recall (0/5) 3  Orientation (0/6) 6  Total 22  Adjusted Score (based on education) 22      Immunization History  Administered Date(s) Administered  . Hepatitis A 01/29/2002  . Influenza Split 09/26/2011  . Influenza Whole 10/14/2001, 09/02/2010  . Influenza, High Dose Seasonal PF 09/06/2015  . Influenza,inj,Quad PF,6+ Mos 09/19/2012, 09/11/2013, 09/22/2014, 03/02/2015, 10/24/2016, 10/23/2017, 09/25/2018  . PPD Test 09/14/1995  . Pneumococcal Conjugate-13 01/21/2014  . Pneumococcal Polysaccharide-23 12/16/2001, 09/02/2010, 09/06/2015  . Tdap 05/21/2013     Screening Tests Health Maintenance  Topic Date Due  . OPHTHALMOLOGY EXAM  12/27/2018  . HEMOGLOBIN A1C  03/13/2019  . FOOT  EXAM  09/25/2019  . COLONOSCOPY  04/25/2022  .  TETANUS/TDAP  05/22/2023  . INFLUENZA VACCINE  Completed  . PNEUMOCOCCAL POLYSACCHARIDE VACCINE AGE 56-64 HIGH RISK  Completed  . Hepatitis C Screening  Completed  . HIV Screening  Completed        Plan:     I have personally reviewed and noted the following in the patient's chart:   . Medical and social history . Use of alcohol, tobacco or illicit drugs  . Current medications and supplements . Functional ability and status . Nutritional status . Physical activity . Advanced directives . List of other physicians . Screenings to include cognitive, depression, and falls . Referrals and appointments  In addition, I have reviewed and discussed with patient certain preventive protocols, quality metrics, and best practice recommendations. A written personalized care plan for preventive services as well as general preventive health recommendations were provided to patient.     Michiel Cowboy, RN  10/25/2018  Medical screening examination/treatment/procedure(s) were performed by non-physician practitioner and as supervising physician I was immediately available for consultation/collaboration. I agree with above. Cathlean Cower, MD

## 2018-11-11 ENCOUNTER — Other Ambulatory Visit: Payer: Self-pay | Admitting: Endocrinology

## 2018-11-17 ENCOUNTER — Other Ambulatory Visit: Payer: Self-pay | Admitting: Internal Medicine

## 2018-11-17 DIAGNOSIS — B20 Human immunodeficiency virus [HIV] disease: Secondary | ICD-10-CM

## 2018-11-26 ENCOUNTER — Telehealth: Payer: Self-pay

## 2018-11-28 ENCOUNTER — Other Ambulatory Visit: Payer: Self-pay

## 2018-11-28 ENCOUNTER — Other Ambulatory Visit: Payer: Medicare HMO

## 2018-11-28 DIAGNOSIS — B20 Human immunodeficiency virus [HIV] disease: Secondary | ICD-10-CM

## 2018-11-29 LAB — T-HELPER CELL (CD4) - (RCID CLINIC ONLY)
CD4 % Helper T Cell: 37 % (ref 33–65)
CD4 T Cell Abs: 858 /uL (ref 400–1790)

## 2018-11-30 ENCOUNTER — Other Ambulatory Visit: Payer: Self-pay | Admitting: Internal Medicine

## 2018-11-30 DIAGNOSIS — F329 Major depressive disorder, single episode, unspecified: Secondary | ICD-10-CM

## 2018-11-30 DIAGNOSIS — F419 Anxiety disorder, unspecified: Secondary | ICD-10-CM

## 2018-12-06 LAB — HIV-1 RNA QUANT-NO REFLEX-BLD
HIV 1 RNA Quant: <20 copies/mL
HIV-1 RNA Quant, Log: <1.3 Log copies/mL

## 2018-12-09 ENCOUNTER — Other Ambulatory Visit: Payer: Medicare HMO

## 2018-12-12 ENCOUNTER — Encounter: Payer: Medicare HMO | Admitting: Internal Medicine

## 2018-12-13 ENCOUNTER — Ambulatory Visit: Payer: Medicare HMO | Admitting: Endocrinology

## 2018-12-16 ENCOUNTER — Encounter: Payer: Self-pay | Admitting: Internal Medicine

## 2018-12-16 ENCOUNTER — Other Ambulatory Visit: Payer: Self-pay

## 2018-12-16 ENCOUNTER — Ambulatory Visit (INDEPENDENT_AMBULATORY_CARE_PROVIDER_SITE_OTHER): Payer: Medicare HMO | Admitting: Internal Medicine

## 2018-12-16 VITALS — BP 111/68 | HR 65 | Temp 98.2°F | Wt 192.8 lb

## 2018-12-16 DIAGNOSIS — Z113 Encounter for screening for infections with a predominantly sexual mode of transmission: Secondary | ICD-10-CM | POA: Diagnosis not present

## 2018-12-16 DIAGNOSIS — B20 Human immunodeficiency virus [HIV] disease: Secondary | ICD-10-CM | POA: Diagnosis not present

## 2018-12-16 DIAGNOSIS — F419 Anxiety disorder, unspecified: Secondary | ICD-10-CM

## 2018-12-16 DIAGNOSIS — F329 Major depressive disorder, single episode, unspecified: Secondary | ICD-10-CM

## 2018-12-16 DIAGNOSIS — F32A Depression, unspecified: Secondary | ICD-10-CM

## 2018-12-16 NOTE — Assessment & Plan Note (Signed)
Doing well, no concerns.  rtc in 6 months.

## 2018-12-16 NOTE — Progress Notes (Signed)
Patient ID: Nicholas Whitehead, male   DOB: 1955-02-14, 63 y.o.   MRN: [id number]   Subjective:    Patient ID: Nicholas Whitehead, male    DOB: October 27, 1955, 63 y.o.   MRN: [id number]  HPI Here for follow up of HIV He continues on Odefsey and no missed doses.  He has no other concerns.  He continues to feel isolated and this has been exacerbated by COVID.  No SI.  Continued depression.  Not interested though in getting any counseling at this time.       Review of Systems  Constitutional: Negative for fatigue.  Gastrointestinal: Negative for diarrhea and nausea.  Skin: Negative for rash.       Objective:   Physical Exam Constitutional:      Appearance: He is well-developed.  Eyes:     General: No scleral icterus. Cardiovascular:     Rate and Rhythm: Normal rate and regular rhythm.     Heart sounds: Normal heart sounds.  Pulmonary:     Effort: Pulmonary effort is normal.  Skin:    Findings: No rash.  Neurological:     General: No focal deficit present.     Mental Status: He is alert.  Psychiatric:        Mood and Affect: Mood normal.         Assessment & Plan:

## 2018-12-16 NOTE — Assessment & Plan Note (Signed)
He continues to have depression.  I offered counseling with our counselor and he will call back if interested.  Does not want to make an appointment at this time.

## 2018-12-24 ENCOUNTER — Other Ambulatory Visit: Payer: Self-pay | Admitting: Internal Medicine

## 2018-12-24 ENCOUNTER — Telehealth: Payer: Self-pay

## 2018-12-24 DIAGNOSIS — F419 Anxiety disorder, unspecified: Secondary | ICD-10-CM

## 2018-12-24 DIAGNOSIS — F329 Major depressive disorder, single episode, unspecified: Secondary | ICD-10-CM

## 2018-12-24 DIAGNOSIS — E1140 Type 2 diabetes mellitus with diabetic neuropathy, unspecified: Secondary | ICD-10-CM

## 2018-12-24 DIAGNOSIS — Z794 Long term (current) use of insulin: Secondary | ICD-10-CM

## 2018-12-24 MED ORDER — TRAZODONE HCL 300 MG PO TABS: ORAL_TABLET | ORAL | 1 refills | Status: DC

## 2018-12-24 MED ORDER — TRAZODONE HCL 300 MG PO TABS: ORAL_TABLET | ORAL | 3 refills | Status: DC

## 2018-12-24 NOTE — Telephone Encounter (Signed)
[facility] 

## 2018-12-24 NOTE — Telephone Encounter (Signed)
Pt called and stated that he needs a refill of trazadone to mail order and also a short supply to [facility] on [address] (pt is completely out).   Pt also requested a referral to podiatry for DM shoe evaluation. Referral has been entered.    Please advise on refill.

## 2019-01-01 ENCOUNTER — Other Ambulatory Visit: Payer: Self-pay | Admitting: Internal Medicine

## 2019-01-01 DIAGNOSIS — B20 Human immunodeficiency virus [HIV] disease: Secondary | ICD-10-CM

## 2019-01-09 ENCOUNTER — Other Ambulatory Visit: Payer: Self-pay | Admitting: Internal Medicine

## 2019-01-09 DIAGNOSIS — E1140 Type 2 diabetes mellitus with diabetic neuropathy, unspecified: Secondary | ICD-10-CM

## 2019-01-14 DIAGNOSIS — Z961 Presence of intraocular lens: Secondary | ICD-10-CM | POA: Diagnosis not present

## 2019-01-14 DIAGNOSIS — H40023 Open angle with borderline findings, high risk, bilateral: Secondary | ICD-10-CM | POA: Diagnosis not present

## 2019-01-14 DIAGNOSIS — E119 Type 2 diabetes mellitus without complications: Secondary | ICD-10-CM | POA: Diagnosis not present

## 2019-01-14 DIAGNOSIS — Z794 Long term (current) use of insulin: Secondary | ICD-10-CM | POA: Diagnosis not present

## 2019-01-14 DIAGNOSIS — H40053 Ocular hypertension, bilateral: Secondary | ICD-10-CM | POA: Diagnosis not present

## 2019-01-14 DIAGNOSIS — H2512 Age-related nuclear cataract, left eye: Secondary | ICD-10-CM | POA: Diagnosis not present

## 2019-01-16 ENCOUNTER — Other Ambulatory Visit: Payer: Self-pay | Admitting: Endocrinology

## 2019-01-16 DIAGNOSIS — E1140 Type 2 diabetes mellitus with diabetic neuropathy, unspecified: Secondary | ICD-10-CM

## 2019-01-16 DIAGNOSIS — Z794 Long term (current) use of insulin: Secondary | ICD-10-CM

## 2019-01-18 ENCOUNTER — Other Ambulatory Visit: Payer: Self-pay | Admitting: Internal Medicine

## 2019-01-18 DIAGNOSIS — I10 Essential (primary) hypertension: Secondary | ICD-10-CM

## 2019-01-19 ENCOUNTER — Telehealth: Payer: Self-pay | Admitting: Endocrinology

## 2019-01-20 DIAGNOSIS — Z0100 Encounter for examination of eyes and vision without abnormal findings: Secondary | ICD-10-CM | POA: Diagnosis not present

## 2019-01-21 ENCOUNTER — Other Ambulatory Visit: Payer: Self-pay

## 2019-01-21 MED ORDER — TOUJEO MAX SOLOSTAR 300 UNIT/ML ~~LOC~~ SOPN: 80.0000 [IU] | PEN_INJECTOR | Freq: Every day | SUBCUTANEOUS | 1 refills | Status: DC

## 2019-01-21 NOTE — Telephone Encounter (Signed)
Rx sent 

## 2019-01-21 NOTE — Telephone Encounter (Signed)
MEDICATION: Toujeo Max Solostar  PHARMACY:  [facility] [address]   IS THIS A 90 DAY SUPPLY :   IS PATIENT OUT OF MEDICATION: yes  IF NOT; HOW MUCH IS LEFT:   LAST APPOINTMENT DATE: @1 /07/2019  NEXT APPOINTMENT DATE:@2 /10/2019  DO WE HAVE YOUR PERMISSION TO LEAVE A DETAILED MESSAGE: Yes  OTHER COMMENTS:    **Let patient know to contact pharmacy at the end of the day to make sure medication is ready. **  ** Please notify patient to allow 48-72 hours to process**  **Encourage patient to contact the pharmacy for refills or they can request refills through [facility]**

## 2019-01-23 ENCOUNTER — Other Ambulatory Visit: Payer: Self-pay | Admitting: Internal Medicine

## 2019-01-23 DIAGNOSIS — E1140 Type 2 diabetes mellitus with diabetic neuropathy, unspecified: Secondary | ICD-10-CM

## 2019-01-23 DIAGNOSIS — Z794 Long term (current) use of insulin: Secondary | ICD-10-CM

## 2019-01-23 DIAGNOSIS — E1165 Type 2 diabetes mellitus with hyperglycemia: Secondary | ICD-10-CM

## 2019-02-09 ENCOUNTER — Other Ambulatory Visit: Payer: Self-pay | Admitting: Internal Medicine

## 2019-02-09 DIAGNOSIS — F409 Phobic anxiety disorder, unspecified: Secondary | ICD-10-CM

## 2019-02-09 DIAGNOSIS — F5105 Insomnia due to other mental disorder: Secondary | ICD-10-CM

## 2019-02-13 ENCOUNTER — Other Ambulatory Visit: Payer: Self-pay | Admitting: Internal Medicine

## 2019-02-13 DIAGNOSIS — E1140 Type 2 diabetes mellitus with diabetic neuropathy, unspecified: Secondary | ICD-10-CM

## 2019-02-17 ENCOUNTER — Other Ambulatory Visit: Payer: Self-pay | Admitting: Internal Medicine

## 2019-02-17 DIAGNOSIS — E1140 Type 2 diabetes mellitus with diabetic neuropathy, unspecified: Secondary | ICD-10-CM

## 2019-02-19 ENCOUNTER — Other Ambulatory Visit (INDEPENDENT_AMBULATORY_CARE_PROVIDER_SITE_OTHER): Payer: Medicare HMO

## 2019-02-19 ENCOUNTER — Other Ambulatory Visit: Payer: Self-pay

## 2019-02-19 ENCOUNTER — Other Ambulatory Visit: Payer: Self-pay | Admitting: Internal Medicine

## 2019-02-19 DIAGNOSIS — I10 Essential (primary) hypertension: Secondary | ICD-10-CM

## 2019-02-19 DIAGNOSIS — E1140 Type 2 diabetes mellitus with diabetic neuropathy, unspecified: Secondary | ICD-10-CM

## 2019-02-19 DIAGNOSIS — Z794 Long term (current) use of insulin: Secondary | ICD-10-CM

## 2019-02-19 LAB — BASIC METABOLIC PANEL
BUN: 9 mg/dL (ref 6–23)
CO2: 34 mEq/L — ABNORMAL HIGH (ref 19–32)
Calcium: 9.8 mg/dL (ref 8.4–10.5)
Chloride: 99 mEq/L (ref 96–112)
Creatinine, Ser: 0.94 mg/dL (ref 0.40–1.50)
GFR: 98.62 mL/min (ref >60.00)
Glucose, Bld: 166 mg/dL — ABNORMAL HIGH (ref 70–99)
Potassium: 3.5 mEq/L (ref 3.5–5.1)
Sodium: 137 mEq/L (ref 135–145)

## 2019-02-19 LAB — MICROALBUMIN / CREATININE URINE RATIO
Creatinine,U: 48.8 mg/dL
Microalb Creat Ratio: 2.1 mg/g (ref 0.0–30.0)
Microalb, Ur: 1 mg/dL (ref 0.0–1.9)

## 2019-02-19 LAB — HEMOGLOBIN A1C: Hgb A1c MFr Bld: 7.6 % — ABNORMAL HIGH (ref 4.6–6.5)

## 2019-02-21 ENCOUNTER — Other Ambulatory Visit: Payer: Self-pay

## 2019-02-24 ENCOUNTER — Other Ambulatory Visit: Payer: Self-pay | Admitting: Internal Medicine

## 2019-02-24 DIAGNOSIS — Z794 Long term (current) use of insulin: Secondary | ICD-10-CM

## 2019-02-24 DIAGNOSIS — E1140 Type 2 diabetes mellitus with diabetic neuropathy, unspecified: Secondary | ICD-10-CM

## 2019-02-24 DIAGNOSIS — I10 Essential (primary) hypertension: Secondary | ICD-10-CM

## 2019-02-25 ENCOUNTER — Ambulatory Visit (INDEPENDENT_AMBULATORY_CARE_PROVIDER_SITE_OTHER): Payer: Medicare HMO | Admitting: Endocrinology

## 2019-02-25 ENCOUNTER — Other Ambulatory Visit: Payer: Self-pay

## 2019-02-25 ENCOUNTER — Encounter: Payer: Self-pay | Admitting: Endocrinology

## 2019-02-25 VITALS — BP 140/68 | HR 64 | Ht 73.0 in | Wt 194.2 lb

## 2019-02-25 DIAGNOSIS — E1165 Type 2 diabetes mellitus with hyperglycemia: Secondary | ICD-10-CM

## 2019-02-25 DIAGNOSIS — Z794 Long term (current) use of insulin: Secondary | ICD-10-CM

## 2019-02-25 DIAGNOSIS — E785 Hyperlipidemia, unspecified: Secondary | ICD-10-CM

## 2019-02-25 NOTE — Progress Notes (Signed)
Patient ID: Nicholas Whitehead, male   DOB: January 14, 1955, 64 y.o.   MRN: [id number]   Reason for Appointment: Type II Diabetes follow-up   History of Present Illness   Diagnosis date: 1998    Previous history: He was started on oral agents at diagnosis and around 2006 was put on insulin His blood sugars are usually well controlled when he takes Actos along with his insulin. He had been on Invokana since 04/2013  He requires large doses of insulin especially basal insulin   Recent history:   Insulin regimen: Toujeo 80 units p.m.;  Novolog 30 AC tid         Oral hypoglycemic drugs: Metformin ER 1000 mg twice a day, Actos 30 mg   A1c is going back up to 7.6 compared to 7 Last visit was in 9/20  Current management, blood sugar patterns and problems:  He was told to go up to 80 units on Toujeo  As before he has variable control  Recently he thinks that his diet has been very poorly managed with excessive snacking and more carbohydrates both because of depression and financial issues  He is having relatively higher fasting readings and usually over 140  Postprandial readings are variable although may not be consistently high  On occasional days his blood sugars are fairly good also  Does not really do much walking or exercise  Weight is about the same as on the last visit     Side effects from medications:  urgency, frequent urination from Invokana Proper timing of medications in relation to meals: Yes.          Monitors blood glucose:  2-3 + times a day  Glucometer:   Accu-Chek   PRE-MEAL Fasting Lunch Dinner Bedtime Overall  Glucose range:  140-214  84, 130  129-260    Mean/median: 190   177  176   POST-MEAL PC Breakfast PC Lunch PC Dinner  Glucose range:    106-224  Mean/median:    178   In 9/20:   PRE-MEAL Fasting Lunch Dinner Bedtime Overall  Glucose range:  112-198    123-192   Mean/median:  150  148  144  146  145           Meals: Breakfast  variable, 9-10 AM Lunch 2-3pm  usually avoiding high-fat meals, dinner at about 6 pm         Physical activity: exercise: doing any formal walking 2/7  Dietician visit: Most recent: 2013    Weight control:  Wt Readings from Last 3 Encounters:  02/25/19 194 lb 3.2 oz (88.1 kg)  12/16/18 192 lb 12.8 oz (87.5 kg)  09/25/18 193 lb (87.5 kg)          Diabetes labs:  Lab Results  Component Value Date   HGBA1C 7.6 (H) 02/19/2019   HGBA1C 7.0 (H) 09/13/2018   HGBA1C 7.3 (A) 06/25/2018   Lab Results  Component Value Date   MICROALBUR 1.0 02/19/2019   LDLCALC 90 03/18/2018   CREATININE 0.94 02/19/2019   Lab Results  Component Value Date   FRUCTOSAMINE 276 09/13/2018   FRUCTOSAMINE 295 (H) 07/09/2018   FRUCTOSAMINE 309 (H) 02/13/2018   FRUCTOSAMINE 270 06/13/2017   FRUCTOSAMINE 353 (H) 11/10/2016    Other active problems discussed today: See review of systems   Lab on 02/19/2019  Component Date Value Ref Range Status  . Hgb A1c MFr Bld 02/19/2019 7.6* 4.6 - 6.5 % Final   Glycemic  Control Guidelines for People with Diabetes:Non Diabetic:  <6%Goal of Therapy: <7%Additional Action Suggested:  >8%   . Sodium 02/19/2019 137  135 - 145 mEq/L Final  . Potassium 02/19/2019 3.5  3.5 - 5.1 mEq/L Final  . Chloride 02/19/2019 99  96 - 112 mEq/L Final  . CO2 02/19/2019 34* 19 - 32 mEq/L Final  . Glucose, Bld 02/19/2019 166* 70 - 99 mg/dL Final  . BUN 02/19/2019 9  6 - 23 mg/dL Final  . Creatinine, Ser 02/19/2019 0.94  0.40 - 1.50 mg/dL Final  . GFR 02/19/2019 98.62  >60.00 mL/min Final  . Calcium 02/19/2019 9.8  8.4 - 10.5 mg/dL Final  . Microalb, Ur 02/19/2019 1.0  0.0 - 1.9 mg/dL Final  . Creatinine,U 02/19/2019 48.8  mg/dL Final  . Microalb Creat Ratio 02/19/2019 2.1  0.0 - 30.0 mg/g Final     Allergies as of 02/25/2019      Reactions   Benazepril Cough   Pravastatin Other (See Comments)   myositis   Glipizide Nausea Only      Medication List       Accurate as of  February 25, 2019 10:42 AM. If you have any questions, ask your nurse or doctor.        STOP taking these medications   True Metrix Air Glucose Meter w/Device Kit Stopped by: Elayne Snare, MD     TAKE these medications   Accu-Chek Guide test strip Generic drug: glucose blood TEST BLOOD SUGAR FOUR TIMES DAILY   albuterol 108 (90 Base) MCG/ACT inhaler Commonly known as: VENTOLIN HFA Inhale 2 puffs into the lungs every 6 (six) hours as needed for wheezing or shortness of breath.   aspirin EC 81 MG tablet Take 81 mg by mouth every morning.   B-D SINGLE USE SWABS REGULAR Pads USE AS DIRECTED   Bevespi Aerosphere 9-4.8 MCG/ACT Aero Generic drug: Glycopyrrolate-Formoterol INHALE 2 PUFFS INTO THE LUNGS 2 (TWO) TIMES DAILY.   Bystolic 20 MG Tabs Generic drug: Nebivolol HCl TAKE 1 TABLET EVERY DAY   cetirizine 10 MG tablet Commonly known as: ZYRTEC Take 10 mg by mouth daily.   Cholecalciferol 50 MCG (2000 UT) Tabs Take 1 tablet (2,000 Units total) by mouth daily.   Droplet Pen Needles 31G X 8 MM Misc Generic drug: Insulin Pen Needle USE TO INJECT INSULIN   DULoxetine 60 MG capsule Commonly known as: CYMBALTA TAKE 1 CAPSULE EVERY DAY   Eszopiclone 3 MG Tabs TAKE 1 TABLET BY MOUTH IMMEDIATELY BEFORE BEDTIME   furosemide 20 MG tablet Commonly known as: LASIX TAKE 1 TABLET TWICE DAILY   gabapentin 300 MG capsule Commonly known as: NEURONTIN TAKE 1 CAPSULE THREE TIMES DAILY   Lancets Misc Use to test blood sugar tid. DX: E11.8   linaclotide 72 MCG capsule Commonly known as: Linzess Take 1 capsule (72 mcg total) by mouth daily before breakfast.   metFORMIN 500 MG 24 hr tablet Commonly known as: GLUCOPHAGE-XR TAKE 2 TABLETS BY MOUTH TWICE A DAY   NovoLOG FlexPen 100 UNIT/ML FlexPen Generic drug: insulin aspart Inject 30 Units into the skin 3 (three) times daily with meals. What changed: Another medication with the same name was removed. Continue taking this  medication, and follow the directions you see here. Changed by: Elayne Snare, MD   Odefsey 200-25-25 MG Tabs tablet Generic drug: emtricitabine-rilpivir-tenofovir AF TAKE 1 TABLET BY MOUTH EVERY DAY   olmesartan-hydrochlorothiazide 40-12.5 MG tablet Commonly known as: BENICAR HCT TAKE 1 TABLET EVERY DAY  pioglitazone 30 MG tablet Commonly known as: ACTOS Take 1 tablet (30 mg total) by mouth daily.   Toujeo Max SoloStar 300 UNIT/ML Sopn Generic drug: Insulin Glargine (2 Unit Dial) Inject 80 Units into the skin daily. What changed: Another medication with the same name was removed. Continue taking this medication, and follow the directions you see here. Changed by: Elayne Snare, MD   trazodone 300 MG tablet Commonly known as: DESYREL TAKE 1 TABLET(300 MG) BY MOUTH AT BEDTIME   triamcinolone cream 0.5 % Commonly known as: KENALOG APPLY 1 APPLICATION TOPICALLY THREE TIME A DAY       Allergies:  Allergies  Allergen Reactions  . Benazepril Cough  . Pravastatin Other (See Comments)    myositis  . Glipizide Nausea Only    Past Medical History:  Diagnosis Date  . Anxiety   . Cataract    right   . Depression   . Diabetes mellitus without complication ([place])   . DM (diabetes mellitus screen)   . HIV disease ([place]) 09/12/1995  . HIV infection ([place])   . Hypercholesterolemia   . Hyperlipidemia   . Hypertension     Past Surgical History:  Procedure Laterality Date  . COLONOSCOPY WITH PROPOFOL  01/16/2012   Procedure: COLONOSCOPY WITH PROPOFOL;  Surgeon: Garlan Fair, MD;  Location: [facility];  Service: Endoscopy;  Laterality: N/A;  . EYE SURGERY  2005   catarct surgery  . optic lens surgery  2005    Family History  Problem Relation Age of Onset  . Arthritis Mother   . Diabetes Mother   . Heart disease Mother   . Stroke Mother   . Arthritis Father   . Diabetes Father   . Heart disease Father     Social History:  reports that he quit smoking about 2 years ago.  His smoking use included cigarettes. He smoked 0.25 packs per day. He has never used smokeless tobacco. He reports that he does not drink alcohol or use drugs.  Review of Systems:   Hypertension:  currently on Bystolic  and on Benicar HCT 1/2, previously on losartan, followed by PCP Also on Lasix as needed  BP Readings from Last 3 Encounters:  02/25/19 140/68  12/16/18 111/68  09/25/18 132/62   RENAL dysfunction: His creatinine in 5/20 was much higher than usual  His Benicar HCT was reduced to half a tablet and his renal function is back to normal now, he did not follow-up with his PCP as recommended  No history of taking OTC nonsteroidal drugs  Lab Results  Component Value Date   CREATININE 0.94 02/19/2019   CREATININE 1.11 09/13/2018   CREATININE 1.02 07/09/2018    Lipids: LDL is normal as of 3/20 followed by PCP   Lab Results  Component Value Date   CHOL 156 03/18/2018   HDL 42 03/18/2018   LDLCALC 90 03/18/2018   TRIG 137 03/18/2018   CHOLHDL 3.7 03/18/2018    He has had symptoms of neuropathy with paresthesia in feet and legs, these are controlled with gabapentin 300 mg 3 times a day      LABS:  Lab on 02/19/2019  Component Date Value Ref Range Status  . Hgb A1c MFr Bld 02/19/2019 7.6* 4.6 - 6.5 % Final   Glycemic Control Guidelines for People with Diabetes:Non Diabetic:  <6%Goal of Therapy: <7%Additional Action Suggested:  >8%   . Sodium 02/19/2019 137  135 - 145 mEq/L Final  . Potassium 02/19/2019 3.5  3.5 -  5.1 mEq/L Final  . Chloride 02/19/2019 99  96 - 112 mEq/L Final  . CO2 02/19/2019 34* 19 - 32 mEq/L Final  . Glucose, Bld 02/19/2019 166* 70 - 99 mg/dL Final  . BUN 02/19/2019 9  6 - 23 mg/dL Final  . Creatinine, Ser 02/19/2019 0.94  0.40 - 1.50 mg/dL Final  . GFR 02/19/2019 98.62  >60.00 mL/min Final  . Calcium 02/19/2019 9.8  8.4 - 10.5 mg/dL Final  . Microalb, Ur 02/19/2019 1.0  0.0 - 1.9 mg/dL Final  . Creatinine,U 02/19/2019 48.8  mg/dL Final    . Microalb Creat Ratio 02/19/2019 2.1  0.0 - 30.0 mg/g Final     Examination:   BP 140/68 (BP Location: Left Arm, Patient Position: Sitting, Cuff Size: Normal)   Pulse 64   Ht _0  (1.854 m)   Wt 194 lb 3.2 oz (88.1 kg)   SpO2 94%   BMI 25.62 kg/m   Body mass index is 25.62 kg/m.     ASSESSMENT/ PLAN:    Diabetes type 2:  See history of present illness for detailed discussion of his current management, blood sugar patterns and problems identified  His A1c is going up and now 7.6  A1c is lower than expected for his home blood sugar average of 176 on the he is not checking after meals as much His blood sugar meter was downloaded and blood sugar averages, patterns and reports were reviewed  He thinks his diet has been poor and he has had less motivation to monitor his carbohydrates, portions even overnight  Has not walked much and has some limitations also He may benefit from a GLP-1 drug but currently does not want to change his regimen  Recommendations: His Toujeo may need to be increased by 6 units if his morning sugars stay consistently over 150 He says he can try and modify his diet over the next few weeks and wants to come back for follow-up in 6 weeks Discussed blood sugar targets at various times He does need to try and walk at least a little every day Cut back on carbohydrates, snacking and have balanced meals as much as possible Consider consultation with dietitian but currently refusing No change in metformin   Hypertension  blood pressure is fairly well controlled now, to continue half tablet of Benicar HCT Renal function stable  Microalbumin normal   There are no Patient Instructions on file for this visit.       Elayne Snare 02/25/2019, 10:42 AM           ..

## 2019-02-25 NOTE — Patient Instructions (Signed)
Toujeo 86 units if am sugar stays >160  Check blood sugars on waking up days a week  Also check blood sugars about 2 hours after meals and do this after different meals by rotation  Recommended blood sugar levels on waking up are 90-130 and about 2 hours after meal is 130-180  Please bring your blood sugar monitor to each visit, thank you

## 2019-03-07 ENCOUNTER — Other Ambulatory Visit: Payer: Self-pay | Admitting: Internal Medicine

## 2019-03-07 ENCOUNTER — Telehealth: Payer: Self-pay | Admitting: *Deleted

## 2019-03-07 DIAGNOSIS — E1140 Type 2 diabetes mellitus with diabetic neuropathy, unspecified: Secondary | ICD-10-CM

## 2019-03-07 NOTE — Telephone Encounter (Signed)
Patient was calling to confirm appt. W/ Dr. Consuelo Pandy on 03/10/19.@11 :00.

## 2019-03-10 ENCOUNTER — Other Ambulatory Visit: Payer: Self-pay

## 2019-03-10 ENCOUNTER — Encounter: Payer: Self-pay | Admitting: Podiatry

## 2019-03-10 ENCOUNTER — Ambulatory Visit (INDEPENDENT_AMBULATORY_CARE_PROVIDER_SITE_OTHER): Payer: Medicare HMO | Admitting: Podiatry

## 2019-03-10 VITALS — Temp 98.0°F

## 2019-03-10 DIAGNOSIS — M2042 Other hammer toe(s) (acquired), left foot: Secondary | ICD-10-CM | POA: Diagnosis not present

## 2019-03-10 DIAGNOSIS — M2141 Flat foot [pes planus] (acquired), right foot: Secondary | ICD-10-CM

## 2019-03-10 DIAGNOSIS — M79674 Pain in right toe(s): Secondary | ICD-10-CM | POA: Diagnosis not present

## 2019-03-10 DIAGNOSIS — M2041 Other hammer toe(s) (acquired), right foot: Secondary | ICD-10-CM

## 2019-03-10 DIAGNOSIS — B351 Tinea unguium: Secondary | ICD-10-CM

## 2019-03-10 DIAGNOSIS — L84 Corns and callosities: Secondary | ICD-10-CM

## 2019-03-10 DIAGNOSIS — M79675 Pain in left toe(s): Secondary | ICD-10-CM

## 2019-03-10 DIAGNOSIS — E1140 Type 2 diabetes mellitus with diabetic neuropathy, unspecified: Secondary | ICD-10-CM

## 2019-03-10 DIAGNOSIS — Z794 Long term (current) use of insulin: Secondary | ICD-10-CM | POA: Diagnosis not present

## 2019-03-10 DIAGNOSIS — M2142 Flat foot [pes planus] (acquired), left foot: Secondary | ICD-10-CM

## 2019-03-10 NOTE — Patient Instructions (Signed)
Diabetes Mellitus and Foot Care Foot care is an important part of your health, especially when you have diabetes. Diabetes may cause you to have problems because of poor blood flow (circulation) to your feet and legs, which can cause your skin to:  Become thinner and drier.  Break more easily.  Heal more slowly.  Peel and crack. You may also have nerve damage (neuropathy) in your legs and feet, causing decreased feeling in them. This means that you may not notice minor injuries to your feet that could lead to more serious problems. Noticing and addressing any potential problems early is the best way to prevent future foot problems. How to care for your feet Foot hygiene  Wash your feet daily with warm water and mild soap. Do not use hot water. Then, pat your feet and the areas between your toes until they are completely dry. Do not soak your feet as this can dry your skin.  Trim your toenails straight across. Do not dig under them or around the cuticle. File the edges of your nails with an emery board or nail file.  Apply a moisturizing lotion or petroleum jelly to the skin on your feet and to dry, brittle toenails. Use lotion that does not contain alcohol and is unscented. Do not apply lotion between your toes. Shoes and socks  Wear clean socks or stockings every day. Make sure they are not too tight. Do not wear knee-high stockings since they may decrease blood flow to your legs.  Wear shoes that fit properly and have enough cushioning. Always look in your shoes before you put them on to be sure there are no objects inside.  To break in new shoes, wear them for just a few hours a day. This prevents injuries on your feet. Wounds, scrapes, corns, and calluses  Check your feet daily for blisters, cuts, bruises, sores, and redness. If you cannot see the bottom of your feet, use a mirror or ask someone for help.  Do not cut corns or calluses or try to remove them with medicine.  If you  find a minor scrape, cut, or break in the skin on your feet, keep it and the skin around it clean and dry. You may clean these areas with mild soap and water. Do not clean the area with peroxide, alcohol, or iodine.  If you have a wound, scrape, corn, or callus on your foot, look at it several times a day to make sure it is healing and not infected. Check for: ? Redness, swelling, or pain. ? Fluid or blood. ? Warmth. ? Pus or a bad smell. General instructions  Do not cross your legs. This may decrease blood flow to your feet.  Do not use heating pads or hot water bottles on your feet. They may burn your skin. If you have lost feeling in your feet or legs, you may not know this is happening until it is too late.  Protect your feet from hot and cold by wearing shoes, such as at the beach or on hot pavement.  Schedule a complete foot exam at least once a year (annually) or more often if you have foot problems. If you have foot problems, report any cuts, sores, or bruises to your health care provider immediately. Contact a health care provider if:  You have a medical condition that increases your risk of infection and you have any cuts, sores, or bruises on your feet.  You have an injury that is not   healing.  You have redness on your legs or feet.  You feel burning or tingling in your legs or feet.  You have pain or cramps in your legs and feet.  Your legs or feet are numb.  Your feet always feel cold.  You have pain around a toenail. Get help right away if:  You have a wound, scrape, corn, or callus on your foot and: ? You have pain, swelling, or redness that gets worse. ? You have fluid or blood coming from the wound, scrape, corn, or callus. ? Your wound, scrape, corn, or callus feels warm to the touch. ? You have pus or a bad smell coming from the wound, scrape, corn, or callus. ? You have a fever. ? You have a red line going up your leg. Summary  Check your feet every day  for cuts, sores, red spots, swelling, and blisters.  Moisturize feet and legs daily.  Wear shoes that fit properly and have enough cushioning.  If you have foot problems, report any cuts, sores, or bruises to your health care provider immediately.  Schedule a complete foot exam at least once a year (annually) or more often if you have foot problems. This information is not intended to replace advice given to you by your health care provider. Make sure you discuss any questions you have with your health care provider. Document Revised: 09/18/2018 Document Reviewed: 01/28/2016 Elsevier Patient Education  2020 [employer]  Peripheral Neuropathy Peripheral neuropathy is a type of nerve damage. It affects nerves that carry signals between the spinal cord and the arms, legs, and the rest of the body (peripheral nerves). It does not affect nerves in the spinal cord or brain. In peripheral neuropathy, one nerve or a group of nerves may be damaged. Peripheral neuropathy is a broad category that includes many specific nerve disorders, like diabetic neuropathy, hereditary neuropathy, and carpal tunnel syndrome. What are the causes? This condition may be caused by:  Diabetes. This is the most common cause of peripheral neuropathy.  Nerve injury.  Pressure or stress on a nerve that lasts a long time.  Lack (deficiency) of B vitamins. This can result from alcoholism, poor diet, or a restricted diet.  Infections.  Autoimmune diseases, such as rheumatoid arthritis and systemic lupus erythematosus.  Nerve diseases that are passed from parent to child (inherited).  Some medicines, such as cancer medicines (chemotherapy).  Poisonous (toxic) substances, such as lead and mercury.  Too little blood flowing to the legs.  Kidney disease.  Thyroid disease. In some cases, the cause of this condition is not known. What are the signs or symptoms? Symptoms of this condition depend on which of your  nerves is damaged. Common symptoms include:  Loss of feeling (numbness) in the feet, hands, or both.  Tingling in the feet, hands, or both.  Burning pain.  Very sensitive skin.  Weakness.  Not being able to move a part of the body (paralysis).  Muscle twitching.  Clumsiness or poor coordination.  Loss of balance.  Not being able to control your bladder.  Feeling dizzy.  Sexual problems. How is this diagnosed? Diagnosing and finding the cause of peripheral neuropathy can be difficult. Your health care provider will take your medical history and do a physical exam. A neurological exam will also be done. This involves checking things that are affected by your brain, spinal cord, and nerves (nervous system). For example, your health care provider will check your reflexes, how you move, and   what you can feel. You may have other tests, such as:  Blood tests.  Electromyogram (EMG) and nerve conduction tests. These tests check nerve function and how well the nerves are controlling the muscles.  Imaging tests, such as CT scans or MRI to rule out other causes of your symptoms.  Removing a small piece of nerve to be examined in a lab (nerve biopsy). This is rare.  Removing and examining a small amount of the fluid that surrounds the brain and spinal cord (lumbar puncture). This is rare. How is this treated? Treatment for this condition may involve:  Treating the underlying cause of the neuropathy, such as diabetes, kidney disease, or vitamin deficiencies.  Stopping medicines that can cause neuropathy, such as chemotherapy.  Medicine to relieve pain. Medicines may include: ? Prescription or over-the-counter pain medicine. ? Antiseizure medicine. ? Antidepressants. ? Pain-relieving patches that are applied to painful areas of skin.  Surgery to relieve pressure on a nerve or to destroy a nerve that is causing pain.  Physical therapy to help improve movement and  balance.  Devices to help you move around (assistive devices). Follow these instructions at home: Medicines  Take over-the-counter and prescription medicines only as told by your health care provider. Do not take any other medicines without first asking your health care provider.  Do not drive or use heavy machinery while taking prescription pain medicine. Lifestyle   Do not use any products that contain nicotine or tobacco, such as cigarettes and e-cigarettes. Smoking keeps blood from reaching damaged nerves. If you need help quitting, ask your health care provider.  Avoid or limit alcohol. Too much alcohol can cause a vitamin B deficiency, and vitamin B is needed for healthy nerves.  Eat a healthy diet. This includes: ? Eating foods that are high in fiber, such as fresh fruits and vegetables, whole grains, and beans. ? Limiting foods that are high in fat and processed sugars, such as fried or sweet foods. General instructions   If you have diabetes, work closely with your health care provider to keep your blood sugar under control.  If you have numbness in your feet: ? Check every day for signs of injury or infection. Watch for redness, warmth, and swelling. ? Wear padded socks and comfortable shoes. These help protect your feet.  Develop a good support system. Living with peripheral neuropathy can be stressful. Consider talking with a mental health specialist or joining a support group.  Use assistive devices and attend physical therapy as told by your health care provider. This may include using a walker or a cane.  Keep all follow-up visits as told by your health care provider. This is important. Contact a health care provider if:  You have new signs or symptoms of peripheral neuropathy.  You are struggling emotionally from dealing with peripheral neuropathy.  Your pain is not well-controlled. Get help right away if:  You have an injury or infection that is not healing  normally.  You develop new weakness in an arm or leg.  You fall frequently. Summary  Peripheral neuropathy is when the nerves in the arms, or legs are damaged, resulting in numbness, weakness, or pain.  There are many causes of peripheral neuropathy, including diabetes, pinched nerves, vitamin deficiencies, autoimmune disease, and hereditary conditions.  Diagnosing and finding the cause of peripheral neuropathy can be difficult. Your health care provider will take your medical history, do a physical exam, and do tests, including blood tests and nerve function tests.    Treatment involves treating the underlying cause of the neuropathy and taking medicines to help control pain. Physical therapy and assistive devices may also help. This information is not intended to replace advice given to you by your health care provider. Make sure you discuss any questions you have with your health care provider. Document Revised: 12/08/2016 Document Reviewed: 03/06/2016 Elsevier Patient Education  2020 [employer]  

## 2019-03-12 ENCOUNTER — Other Ambulatory Visit: Payer: Self-pay | Admitting: Internal Medicine

## 2019-03-12 DIAGNOSIS — J410 Simple chronic bronchitis: Secondary | ICD-10-CM

## 2019-03-13 NOTE — Progress Notes (Signed)
Subjective: Nicholas Whitehead presents today referred by [name], MD for diabetic foot evaluation.  Patient relates long term history of diabetes.  Patient denies any history of foot wounds.  Patient admits any history of numbness, tingling, burning, pins/needles sensations managed with gabapentin.  Today, patient c/o of painful, discolored, thick toenails which interfere with daily activities.  Pain is aggravated when wearing enclosed shoe gear.   He is also requesting diabetic shoes on today's visit.  Past Medical History:  Diagnosis Date  . Anxiety   . Cataract    right   . Depression   . Diabetes mellitus without complication ([place])   . DM (diabetes mellitus screen)   . HIV disease ([place]) 09/12/1995  . HIV infection ([place])   . Hypercholesterolemia   . Hyperlipidemia   . Hypertension     Patient Active Problem List   Diagnosis Date Noted  . Chronic renal disease, stage 3, moderately decreased glomerular filtration rate (GFR) between 30-59 mL/min/1.73 square meter 06/25/2018  . Vitamin D deficiency 04/04/2017  . Uncontrolled type 2 diabetes mellitus with hyperglycemia, with long-term current use of insulin ([place]) 04/04/2017  . Tobacco abuse 12/28/2016  . Chronic idiopathic constipation 10/24/2016  . Insomnia due to anxiety and fear 08/22/2016  . Intermittent claudication ([place]) 02/07/2016  . Drug-induced erectile dysfunction 09/06/2015  . Simple chronic bronchitis ([place]) 05/06/2015  . Mild cognitive impairment 05/06/2015  . Type 2 diabetes mellitus with diabetic neuropathy, with long-term current use of insulin ([place]) 04/30/2015  . Nonspecific abnormal electrocardiogram (ECG) (EKG) 03/25/2015  . Screening examination for venereal disease 08/12/2013  . Routine general medical examination at a health care facility 05/21/2013  . BPH (benign prostatic hyperplasia) 05/21/2013  . 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 Anxiety and depression 04/18/2011  . Hyperlipidemia LDL goal <100   .  Diabetes mellitus with diabetic neuropathy ([place])   . Hypertension   . HIV disease ([place]) 09/12/1995    Past Surgical History:  Procedure Laterality Date  . COLONOSCOPY WITH PROPOFOL  01/16/2012   Procedure: COLONOSCOPY WITH PROPOFOL;  Surgeon: Garlan Fair, MD;  Location: [facility];  Service: Endoscopy;  Laterality: N/A;  . EYE SURGERY  2005   catarct surgery  . optic lens surgery  2005    Current Outpatient Medications on File Prior to Visit  Medication Sig Dispense Refill  . albuterol (PROVENTIL HFA;VENTOLIN HFA) 108 (90 Base) MCG/ACT inhaler Inhale 2 puffs into the lungs every 6 (six) hours as needed for wheezing or shortness of breath. 1 Inhaler 5  . Alcohol Swabs (B-D SINGLE USE SWABS REGULAR) PADS USE AS DIRECTED 300 each 1  . aspirin EC 81 MG tablet Take 81 mg by mouth every morning.    Marland Kitchen BYSTOLIC 20 MG TABS TAKE 1 TABLET EVERY DAY 90 tablet 1  . cetirizine (ZYRTEC) 10 MG tablet Take 10 mg by mouth daily.    . Cholecalciferol 2000 units TABS Take 1 tablet (2,000 Units total) by mouth daily. 90 tablet 1  . DROPLET PEN NEEDLES 31G X 8 MM MISC USE TO INJECT INSULIN 500 each 1  . DULoxetine (CYMBALTA) 60 MG capsule TAKE 1 CAPSULE EVERY DAY 90 capsule 1  . Eszopiclone 3 MG TABS TAKE 1 TABLET BY MOUTH IMMEDIATELY BEFORE BEDTIME 90 tablet 1  . furosemide (LASIX) 20 MG tablet TAKE 1 TABLET TWICE DAILY 180 tablet 1  . gabapentin (NEURONTIN) 300 MG capsule TAKE 1 CAPSULE THREE TIMES DAILY 270 capsule 1  . glucose blood (ACCU-CHEK GUIDE)  test strip TEST BLOOD SUGAR FOUR TIMES DAILY 400 strip 3  . insulin aspart (NOVOLOG FLEXPEN) 100 UNIT/ML FlexPen Inject 30 Units into the skin 3 (three) times daily with meals.    . Insulin Glargine, 2 Unit Dial, (TOUJEO MAX SOLOSTAR) 300 UNIT/ML SOPN Inject 80 Units into the skin daily.    . Lancets MISC Use to test blood sugar tid. DX: E11.8 300 each 3  . linaclotide (LINZESS) 72 MCG capsule Take 1 capsule (72 mcg total) by mouth daily before  breakfast. 90 capsule 1  . metFORMIN (GLUCOPHAGE-XR) 500 MG 24 hr tablet TAKE 2 TABLETS BY MOUTH TWICE A DAY 360 tablet 1  . ODEFSEY 200-25-25 MG TABS tablet TAKE 1 TABLET BY MOUTH EVERY DAY 30 tablet 5  . olmesartan-hydrochlorothiazide (BENICAR HCT) 40-12.5 MG tablet TAKE 1 TABLET EVERY DAY 90 tablet 1  . pioglitazone (ACTOS) 30 MG tablet TAKE 1 TABLET EVERY DAY 90 tablet 1  . trazodone (DESYREL) 300 MG tablet TAKE 1 TABLET(300 MG) BY MOUTH AT BEDTIME 90 tablet 1  . triamcinolone cream (KENALOG) 0.5 % APPLY 1 APPLICATION TOPICALLY THREE TIME A DAY 100 g 1  . [DISCONTINUED] metFORMIN (GLUCOPHAGE-XR) 500 MG 24 hr tablet TAKE 2 TABLETS BY MOUTH TWICE DAILY 360 tablet 1  . [DISCONTINUED] Nebivolol HCl (BYSTOLIC) 20 MG TABS Take 1 tablet (20 mg total) by mouth daily. 90 tablet 1  . [DISCONTINUED] olmesartan-hydrochlorothiazide (BENICAR HCT) 40-12.5 MG tablet TAKE 1 TABLET EVERY DAY 90 tablet 1  . [DISCONTINUED] pioglitazone (ACTOS) 30 MG tablet Take 1 tablet (30 mg total) by mouth daily. 90 tablet 1   No current facility-administered medications on file prior to visit.     Allergies  Allergen Reactions  . Benazepril Cough  . Pravastatin Other (See Comments)    myositis  . Glipizide Nausea Only    Social History   Occupational History  . Occupation: disability  Tobacco Use  . Smoking status: Former Smoker    Packs/day: 0.25    Types: Cigarettes    Quit date: 09/24/2016    Years since quitting: 2.4  . Smokeless tobacco: Never Used  Substance and Sexual Activity  . Alcohol use: No    Alcohol/week: 0.0 standard drinks    Comment: socially  . Drug use: No    Comment: rarely  . Sexual activity: Never    Birth control/protection: Other-see comments    Comment: declined condoms    Family History  Problem Relation Age of Onset  . Arthritis Mother   . Diabetes Mother   . Heart disease Mother   . Stroke Mother   . Arthritis Father   . Diabetes Father   . Heart disease Father      Immunization History  Administered Date(s) Administered  . Hepatitis A 01/29/2002  . Influenza Split 09/26/2011  . Influenza Whole 10/14/2001, 09/02/2010  . Influenza, High Dose Seasonal PF 09/06/2015  . Influenza,inj,Quad PF,6+ Mos 09/19/2012, 09/11/2013, 09/22/2014, 03/02/2015, 10/24/2016, 10/23/2017, 09/25/2018  . PPD Test 09/14/1995  . Pneumococcal Conjugate-13 01/21/2014  . Pneumococcal Polysaccharide-23 12/16/2001, 09/02/2010, 09/06/2015  . Tdap 05/21/2013    Review of systems: Positive Findings in bold print.  Constitutional:  chills, fatigue, fever, sweats, weight change Communication: Optometrist, sign Ecologist, hand writing, iPad/Android device Head: headaches, head injury Eyes: changes in vision, eye pain, glaucoma, cataracts, macular degeneration, diplopia, glare,  light sensitivity, eyeglasses or contacts, blindness Ears nose mouth throat: hearing impaired, hearing aids,  ringing in ears, deaf, sign language,  vertigo, nosebleeds,  rhinitis,  cold sores, snoring, swollen glands Cardiovascular: HTN, edema, arrhythmia, pacemaker in place, defibrillator in place, chest pain/tightness, chronic anticoagulation, blood clot, heart failure, MI Peripheral Vascular: leg cramps, varicose veins, blood clots, lymphedema, varicosities Respiratory:  difficulty breathing, denies congestion, SOB, wheezing, cough, emphysema Gastrointestinal: change in appetite or weight, abdominal pain, constipation, diarrhea, nausea, vomiting, vomiting blood, change in bowel habits, abdominal pain, jaundice, rectal bleeding, hemorrhoids, GERD Genitourinary:  nocturia,  pain on urination, polyuria,  blood in urine, Foley catheter, urinary urgency, ESRD on hemodialysis Musculoskeletal: amputation, cramping, stiff joints, painful joints, decreased joint motion, fractures, OA, gout, hemiplegia, paraplegia, uses cane, wheelchair bound, uses walker, uses rollator Skin: +changes in toenails, color  change, dryness, itching, mole changes,  rash, wound(s), callus, corn Neurological: headaches, numbness in feet, paresthesias in feet, burning in feet, fainting,  seizures, change in speech,  headaches, memory problems/poor historian, cerebral palsy, weakness, paralysis, CVA, TIA Endocrine: diabetes, hypothyroidism, hyperthyroidism,  goiter, dry mouth, flushing, heat intolerance,  cold intolerance,  excessive thirst, denies polyuria,  nocturia Hematological:  easy bleeding, excessive bleeding, easy bruising, enlarged lymph nodes, on long term blood thinner, history of past transusions Allergy/immunological:  hives, eczema, frequent infections, multiple drug allergies, seasonal allergies, transplant recipient, multiple food allergies Psychiatric:  anxiety, depression, mood disorder, suicidal ideations, hallucinations, insomnia  Objective: Vitals:   03/10/19 1107  Temp: 98 F (36.7 C)    Capillary refill time to digits immediate b/l. Faintly palpable DP pulses b/l. Non-palpable PT pulses b/. Pedal hair absent b/l Skin temperature gradient warm to cool b/l.  Pedal skin with normal turgor, texture and tone bilaterally. No open wounds bilaterally. No interdigital macerations bilaterally. Toenails 1-5 b/l elongated, dystrophic, thickened, crumbly with subungual debris and tenderness to dorsal palpation. Hyperkeratotic lesion(s) plantar heels b/l.Marland Kitchen  No erythema, no edema, no drainage, no flocculence.  Normal muscle strength 5/5 to all lower extremity muscle groups bilaterally, no pain crepitus or joint limitation noted with ROM b/l, hammertoes noted to the  1-5 bilaterally and moderate to severe pes planus deformity noted b/l.  Protective sensation decreased with 10 gram monofilament b/l and vibratory sensation intact b/l  1. Pain due to onychomycosis of toenails of both feet   2. Callus   3. Acquired hammertoes of both feet   4. Pes planus of both feet   5. Type 2 diabetes mellitus with diabetic  neuropathy, with long-term current use of insulin ([place])     -Diabetic foot examination performed on today's visit. -Discussed diabetic foot care principles. Literature dispensed on today. -Regarding diabetic shoes: Per Medicare guidelines, patient's feet need to be evaluated by an MD/DO managing patient's diabetes and diabetic shoe certification form needs to be signed by the MD/DO. If patient's diabetes is being managed by an Endocrinologist, the Endocrinologist must evaluate patient's feet and sign the Medicare diabetic shoe certification form. -Toenails 1-5 b/l were debrided in length and girth with sterile nail nippers and dremel without iatrogenic bleeding.  -Calluses were debrided without complication or incident. Total number debrided =2 bilateral heels. -Patient to continue soft, supportive shoe gear daily. -Patient to report any pedal injuries to medical professional immediately. -Patient/POA to call should there be question/concern in the interim.

## 2019-03-16 ENCOUNTER — Other Ambulatory Visit: Payer: Self-pay | Admitting: Endocrinology

## 2019-03-20 ENCOUNTER — Other Ambulatory Visit: Payer: Self-pay

## 2019-03-20 ENCOUNTER — Encounter: Payer: Self-pay | Admitting: Internal Medicine

## 2019-03-20 ENCOUNTER — Ambulatory Visit (INDEPENDENT_AMBULATORY_CARE_PROVIDER_SITE_OTHER): Payer: Medicare HMO | Admitting: Internal Medicine

## 2019-03-20 ENCOUNTER — Telehealth: Payer: Self-pay | Admitting: *Deleted

## 2019-03-20 VITALS — BP 148/62 | HR 67 | Temp 99.6°F | Resp 16 | Ht 73.0 in | Wt 194.0 lb

## 2019-03-20 DIAGNOSIS — R0600 Dyspnea, unspecified: Secondary | ICD-10-CM

## 2019-03-20 DIAGNOSIS — Z0000 Encounter for general adult medical examination without abnormal findings: Secondary | ICD-10-CM

## 2019-03-20 DIAGNOSIS — N400 Enlarged prostate without lower urinary tract symptoms: Secondary | ICD-10-CM | POA: Diagnosis not present

## 2019-03-20 DIAGNOSIS — I10 Essential (primary) hypertension: Secondary | ICD-10-CM

## 2019-03-20 DIAGNOSIS — R0609 Other forms of dyspnea: Secondary | ICD-10-CM

## 2019-03-20 DIAGNOSIS — E559 Vitamin D deficiency, unspecified: Secondary | ICD-10-CM | POA: Diagnosis not present

## 2019-03-20 DIAGNOSIS — N1831 Chronic kidney disease, stage 3a: Secondary | ICD-10-CM | POA: Diagnosis not present

## 2019-03-20 DIAGNOSIS — E785 Hyperlipidemia, unspecified: Secondary | ICD-10-CM | POA: Diagnosis not present

## 2019-03-20 LAB — VITAMIN D 25 HYDROXY (VIT D DEFICIENCY, FRACTURES): VITD: 56.4 ng/mL (ref 30.00–100.00)

## 2019-03-20 LAB — LIPID PANEL
Cholesterol: 154 mg/dL (ref 0–200)
HDL: 41.2 mg/dL (ref >39.00)
LDL Cholesterol: 85 mg/dL (ref 0–99)
NonHDL: 112.94
Total CHOL/HDL Ratio: 4
Triglycerides: 141 mg/dL (ref 0.0–149.0)
VLDL: 28.2 mg/dL (ref 0.0–40.0)

## 2019-03-20 LAB — PSA: PSA: 1.03 ng/mL (ref 0.10–4.00)

## 2019-03-20 LAB — TSH: TSH: 1.21 u[IU]/mL (ref 0.35–4.50)

## 2019-03-20 NOTE — Patient Instructions (Signed)

## 2019-03-20 NOTE — Telephone Encounter (Signed)
Pt informed per [facility] his CBC was not performed today due to specimen being clotted. He states he can come back to our office on 03/25/19 to have this re-drawn.

## 2019-03-20 NOTE — Progress Notes (Signed)
Subjective:  Patient ID: Nicholas Whitehead, male    DOB: 23-Aug-1955  Age: 64 y.o. MRN: [id number]  CC: Annual Exam, Hypertension, Hyperlipidemia, and Diabetes  This visit occurred during the SARS-CoV-2 public health emergency.  Safety protocols were in place, including screening questions prior to the visit, additional usage of staff PPE, and extensive cleaning of exam room while observing appropriate contact time as indicated for disinfecting solutions.   HPI Nicholas Whitehead presents for a CPX.  He complains of his baseline level of shortness of breath and dyspnea on exertion.  When he is active he does not experience chest pain, diaphoresis, dizziness, or lightheadedness.  He had a normal myocardial perfusion imaging done just 4 years ago.  He denies cough, hemoptysis, weight loss, palpitations, or edema.  Outpatient Medications Prior to Visit  Medication Sig Dispense Refill  . albuterol (PROVENTIL HFA;VENTOLIN HFA) 108 (90 Base) MCG/ACT inhaler Inhale 2 puffs into the lungs every 6 (six) hours as needed for wheezing or shortness of breath. 1 Inhaler 5  . Alcohol Swabs (B-D SINGLE USE SWABS REGULAR) PADS USE AS DIRECTED 300 each 1  . aspirin EC 81 MG tablet Take 81 mg by mouth every morning.    Marland Kitchen BEVESPI AEROSPHERE 9-4.8 MCG/ACT AERO INHALE 2 PUFFS INTO THE LUNGS TWO TIMES DAILY 32.1 g 1  . BYSTOLIC 20 MG TABS TAKE 1 TABLET EVERY DAY 90 tablet 1  . cetirizine (ZYRTEC) 10 MG tablet Take 10 mg by mouth daily.    . Cholecalciferol 2000 units TABS Take 1 tablet (2,000 Units total) by mouth daily. 90 tablet 1  . DROPLET PEN NEEDLES 31G X 8 MM MISC USE TO INJECT INSULIN 500 each 1  . DULoxetine (CYMBALTA) 60 MG capsule TAKE 1 CAPSULE EVERY DAY 90 capsule 1  . Eszopiclone 3 MG TABS TAKE 1 TABLET BY MOUTH IMMEDIATELY BEFORE BEDTIME 90 tablet 1  . furosemide (LASIX) 20 MG tablet TAKE 1 TABLET TWICE DAILY 180 tablet 1  . gabapentin (NEURONTIN) 300 MG capsule TAKE 1 CAPSULE THREE TIMES DAILY 270 capsule  1  . glucose blood (ACCU-CHEK GUIDE) test strip TEST BLOOD SUGAR FOUR TIMES DAILY 400 strip 3  . Insulin Glargine, 2 Unit Dial, (TOUJEO MAX SOLOSTAR) 300 UNIT/ML SOPN Inject 80 Units into the skin daily.    . Lancets MISC Use to test blood sugar tid. DX: E11.8 300 each 3  . linaclotide (LINZESS) 72 MCG capsule Take 1 capsule (72 mcg total) by mouth daily before breakfast. 90 capsule 1  . metFORMIN (GLUCOPHAGE-XR) 500 MG 24 hr tablet TAKE 2 TABLETS BY MOUTH TWICE A DAY 360 tablet 1  . NOVOLOG FLEXPEN 100 UNIT/ML FlexPen ADMINISTER 25 TO 30 UNITS UNDER THE SKIN THREE TIMES DAILY BEFORE MEALS. MAY TAKE LESS IF SUGAR IS BELOW 120 OR EATING A SMALL MEAL 30 mL 1  . ODEFSEY 200-25-25 MG TABS tablet TAKE 1 TABLET BY MOUTH EVERY DAY 30 tablet 5  . olmesartan-hydrochlorothiazide (BENICAR HCT) 40-12.5 MG tablet TAKE 1 TABLET EVERY DAY 90 tablet 1  . pioglitazone (ACTOS) 30 MG tablet TAKE 1 TABLET EVERY DAY 90 tablet 1  . trazodone (DESYREL) 300 MG tablet TAKE 1 TABLET(300 MG) BY MOUTH AT BEDTIME 90 tablet 1  . triamcinolone cream (KENALOG) 0.5 % APPLY 1 APPLICATION TOPICALLY THREE TIME A DAY 100 g 1   No facility-administered medications prior to visit.    ROS Review of Systems  Constitutional: Negative for appetite change, diaphoresis, fatigue and unexpected weight change.  HENT: Negative.  Eyes: Negative for visual disturbance.  Respiratory: Positive for shortness of breath. Negative for cough, chest tightness and wheezing.   Cardiovascular: Negative for chest pain, palpitations and leg swelling.  Gastrointestinal: Negative for abdominal pain, blood in stool, constipation, diarrhea, nausea and vomiting.  Genitourinary: Negative.  Negative for difficulty urinating, scrotal swelling, testicular pain and urgency.  Musculoskeletal: Negative.  Negative for arthralgias and myalgias.  Skin: Negative.   Neurological: Negative.  Negative for dizziness, weakness, light-headedness and headaches.   Hematological: Negative for adenopathy. Does not bruise/bleed easily.  Psychiatric/Behavioral: Negative.     Objective:  BP (!) 148/62 (BP Location: Left Arm, Patient Position: Sitting, Cuff Size: Normal)   Pulse 67   Temp 99.6 F (37.6 C) (Oral)   Resp 16   Ht 6\' 1"  (1.854 m)   Wt 194 lb (88 kg)   SpO2 94%   BMI 25.60 kg/m   BP Readings from Last 3 Encounters:  03/20/19 (!) 148/62  02/25/19 140/68  12/16/18 111/68    Wt Readings from Last 3 Encounters:  03/20/19 194 lb (88 kg)  02/25/19 194 lb 3.2 oz (88.1 kg)  12/16/18 192 lb 12.8 oz (87.5 kg)    Physical Exam Vitals reviewed.  Constitutional:      Appearance: Normal appearance.  HENT:     Nose: Nose normal.     Mouth/Throat:     Mouth: Mucous membranes are moist.  Eyes:     General: No scleral icterus.    Conjunctiva/sclera: Conjunctivae normal.  Cardiovascular:     Rate and Rhythm: Normal rate and regular rhythm.     Heart sounds: No murmur. No gallop.      Comments: EKG -  NSR +LVH NS T wave abn No changes compared to the prior EKG Pulmonary:     Effort: Pulmonary effort is normal.     Breath sounds: No stridor. No wheezing, rhonchi or rales.  Abdominal:     General: Abdomen is flat. Bowel sounds are normal. There is no distension.     Palpations: Abdomen is soft. There is no hepatomegaly, splenomegaly or mass.     Tenderness: There is no abdominal tenderness.  Genitourinary:    Comments: He was not willing to undress for a GU/rectal exam. Musculoskeletal:        General: Normal range of motion.     Cervical back: Neck supple.     Right lower leg: No edema.     Left lower leg: No edema.  Lymphadenopathy:     Cervical: No cervical adenopathy.  Skin:    General: Skin is warm and dry.     Coloration: Skin is not pale.     Findings: No rash.  Neurological:     General: No focal deficit present.     Mental Status: He is alert.  Psychiatric:        Mood and Affect: Mood normal.        Behavior:  Behavior normal.     Lab Results  Component Value Date   WBC 5.6 03/18/2018   HGB 15.3 03/18/2018   HCT 44.4 03/18/2018   PLT 166 03/18/2018   GLUCOSE 166 (H) 02/19/2019   CHOL 154 03/20/2019   TRIG 141.0 03/20/2019   HDL 41.20 03/20/2019   LDLCALC 85 03/20/2019   ALT 13 03/18/2018   AST 15 03/18/2018   NA 137 02/19/2019   K 3.5 02/19/2019   CL 99 02/19/2019   CREATININE 0.94 02/19/2019   BUN 9 02/19/2019  CO2 34 (H) 02/19/2019   TSH 1.21 03/20/2019   PSA 1.03 03/20/2019   HGBA1C 7.6 (H) 02/19/2019   MICROALBUR 1.0 02/19/2019    DG Abdomen 1 View  Result Date: 09/11/2016 CLINICAL DATA:  Abdominal pain.  Constipation. EXAM: ABDOMEN - 1 VIEW COMPARISON:  None. FINDINGS: No dilated small bowel loops. Mild gas and stool in the large bowel. No evidence of pneumatosis or pneumoperitoneum. No radiopaque urolithiasis. Mild lumbar spondylosis. IMPRESSION: Nonobstructive bowel gas pattern.  Mild colonic stool burden. Electronically Signed   By: Ilona Sorrel M.D.   On: 09/11/2016 14:28    Assessment & Plan:   A[place]ham was seen today for annual exam, hypertension, hyperlipidemia and diabetes.  Diagnoses and all orders for this visit:  Essential hypertension- His blood pressure is adequately well controlled. -     CBC with Differential/Platelet -     EKG 12-Lead  Stage 3a chronic kidney disease- His renal function is stable.  He was encouraged to avoid nephrotoxic agents.  Will continue to maintain control of his blood pressure and his blood sugars. -     CBC with Differential/Platelet  Benign prostatic hyperplasia without lower urinary tract symptoms- His PSA is normal which is reassuring that he does not have prostate cancer.  He has no symptoms that need to be treated. -     PSA  Hyperlipidemia LDL goal <100- He has an elevated ASCVD risk score.  He previously did not tolerate pravastatin.  I have asked him to try pitavastatin which has a better side effect profile and is safe  with his HIV meds. -     Lipid panel -     TSH  Vitamin D deficiency -     VITAMIN D 25 Hydroxy (Vit-D Deficiency, Fractures)  DOE (dyspnea on exertion)- He has dyspnea on exertion but no cardiac symptoms.  His EKG is unchanged.  He was evaluated for ischemia about 4 years ago.  I think the DOE is related to his lung disease. -     EKG 12-Lead  Routine general medical examination at a health care facility- Limited exam completed at his request, vaccines reviewed and updated, cancer screenings are up-to-date, patient education material was given.   I am having Debbe Bales maintain his cetirizine, aspirin EC, triamcinolone cream, albuterol, Cholecalciferol, Lancets, linaclotide, DULoxetine, B-D SINGLE USE SWABS REGULAR, trazodone, Odefsey, Droplet Pen Needles, furosemide, Accu-Chek Guide, Eszopiclone, gabapentin, metFORMIN, Bystolic, olmesartan-hydrochlorothiazide, Toujeo Max SoloStar, pioglitazone, Bevespi Aerosphere, and NovoLOG FlexPen.  No orders of the defined types were placed in this encounter.    Follow-up: Return in about 6 months (around 09/20/2019).  Scarlette Calico, MD

## 2019-03-22 ENCOUNTER — Encounter: Payer: Self-pay | Admitting: Internal Medicine

## 2019-03-22 MED ORDER — LIVALO 1 MG PO TABS: 1.0000 | ORAL_TABLET | Freq: Every day | ORAL | 1 refills | Status: DC

## 2019-03-25 ENCOUNTER — Other Ambulatory Visit: Payer: Self-pay

## 2019-03-25 ENCOUNTER — Other Ambulatory Visit (INDEPENDENT_AMBULATORY_CARE_PROVIDER_SITE_OTHER): Payer: Medicare HMO

## 2019-03-25 DIAGNOSIS — I10 Essential (primary) hypertension: Secondary | ICD-10-CM

## 2019-03-25 DIAGNOSIS — N1831 Chronic kidney disease, stage 3a: Secondary | ICD-10-CM | POA: Diagnosis not present

## 2019-03-25 NOTE — Addendum Note (Signed)
Addended by: Cresenciano Lick on: 03/25/2019 01:18 PM   Modules accepted: Orders

## 2019-03-26 LAB — CBC WITH DIFFERENTIAL/PLATELET
Basophils Absolute: 0 10*3/uL (ref 0.0–0.1)
Basophils Relative: 0.5 % (ref 0.0–3.0)
Eosinophils Absolute: 0.1 10*3/uL (ref 0.0–0.7)
Eosinophils Relative: 1.1 % (ref 0.0–5.0)
HCT: 45.5 % (ref 39.0–52.0)
Hemoglobin: 15.6 g/dL (ref 13.0–17.0)
Lymphocytes Relative: 32.7 % (ref 12.0–46.0)
Lymphs Abs: 2.4 10*3/uL (ref 0.7–4.0)
MCHC: 34.2 g/dL (ref 30.0–36.0)
MCV: 98.2 fl (ref 78.0–100.0)
Monocytes Absolute: 0.8 10*3/uL (ref 0.1–1.0)
Monocytes Relative: 10.4 % (ref 3.0–12.0)
Neutro Abs: 4 10*3/uL (ref 1.4–7.7)
Neutrophils Relative %: 55.3 % (ref 43.0–77.0)
Platelets: 189 10*3/uL (ref 150.0–400.0)
RBC: 4.63 Mil/uL (ref 4.22–5.81)
RDW: 13.6 % (ref 11.5–15.5)
WBC: 7.3 10*3/uL (ref 4.0–10.5)

## 2019-04-01 ENCOUNTER — Other Ambulatory Visit: Payer: Self-pay

## 2019-04-01 ENCOUNTER — Telehealth: Payer: Self-pay

## 2019-04-01 ENCOUNTER — Ambulatory Visit: Payer: Medicare HMO | Admitting: Orthotics

## 2019-04-01 MED ORDER — TOUJEO MAX SOLOSTAR 300 UNIT/ML ~~LOC~~ SOPN: 86.0000 [IU] | PEN_INJECTOR | Freq: Every day | SUBCUTANEOUS | 2 refills | Status: DC

## 2019-04-01 NOTE — Telephone Encounter (Signed)
Called to reschedule patients 05/02/19 appointment per Dr. Dwyane Dee due to him being out of the office that day-the patient answered and he asked if he could call me back to reschedule because he was getting ready to go to an appointment

## 2019-04-16 ENCOUNTER — Other Ambulatory Visit: Payer: Self-pay | Admitting: Internal Medicine

## 2019-04-16 ENCOUNTER — Telehealth: Payer: Self-pay | Admitting: Internal Medicine

## 2019-04-16 DIAGNOSIS — F419 Anxiety disorder, unspecified: Secondary | ICD-10-CM

## 2019-04-16 DIAGNOSIS — F329 Major depressive disorder, single episode, unspecified: Secondary | ICD-10-CM

## 2019-04-16 DIAGNOSIS — E785 Hyperlipidemia, unspecified: Secondary | ICD-10-CM

## 2019-04-16 MED ORDER — LIVALO 1 MG PO TABS: 1.0000 | ORAL_TABLET | Freq: Every day | ORAL | 1 refills | Status: DC

## 2019-04-16 NOTE — Telephone Encounter (Signed)
Did you want to change the Lunesta or adjust the trazadone? The trazadone is at 300 mg currently.

## 2019-04-16 NOTE — Telephone Encounter (Signed)
   Patient calling to discuss med list. He states he is unsure if he was to stop taking any of meds due to recent medication being added.

## 2019-04-16 NOTE — Telephone Encounter (Signed)
Contacted pt and he stated that the sleep medication was going to be increased at the last visit.   Please advise

## 2019-04-17 ENCOUNTER — Other Ambulatory Visit: Payer: Self-pay | Admitting: Internal Medicine

## 2019-04-17 DIAGNOSIS — F329 Major depressive disorder, single episode, unspecified: Secondary | ICD-10-CM

## 2019-04-17 DIAGNOSIS — F32A Depression, unspecified: Secondary | ICD-10-CM

## 2019-04-17 MED ORDER — TRAZODONE HCL 150 MG PO TABS: 450.0000 mg | ORAL_TABLET | Freq: Every day | ORAL | 1 refills | Status: DC

## 2019-04-18 NOTE — Telephone Encounter (Signed)
Pt informed PA was approved and to call the pharmacy.

## 2019-04-28 ENCOUNTER — Other Ambulatory Visit (INDEPENDENT_AMBULATORY_CARE_PROVIDER_SITE_OTHER): Payer: Medicare HMO

## 2019-04-28 ENCOUNTER — Other Ambulatory Visit: Payer: Self-pay

## 2019-04-28 DIAGNOSIS — Z794 Long term (current) use of insulin: Secondary | ICD-10-CM | POA: Diagnosis not present

## 2019-04-28 DIAGNOSIS — E785 Hyperlipidemia, unspecified: Secondary | ICD-10-CM

## 2019-04-28 DIAGNOSIS — E1165 Type 2 diabetes mellitus with hyperglycemia: Secondary | ICD-10-CM | POA: Diagnosis not present

## 2019-04-28 LAB — BASIC METABOLIC PANEL
BUN: 9 mg/dL (ref 6–23)
CO2: 34 mEq/L — ABNORMAL HIGH (ref 19–32)
Calcium: 10 mg/dL (ref 8.4–10.5)
Chloride: 99 mEq/L (ref 96–112)
Creatinine, Ser: 0.88 mg/dL (ref 0.40–1.50)
GFR: 106.36 mL/min (ref >60.00)
Glucose, Bld: 134 mg/dL — ABNORMAL HIGH (ref 70–99)
Potassium: 3.7 mEq/L (ref 3.5–5.1)
Sodium: 138 mEq/L (ref 135–145)

## 2019-04-28 LAB — LIPID PANEL
Cholesterol: 130 mg/dL (ref 0–200)
HDL: 35.4 mg/dL — ABNORMAL LOW (ref >39.00)
LDL Cholesterol: 75 mg/dL (ref 0–99)
NonHDL: 94.63
Total CHOL/HDL Ratio: 4
Triglycerides: 99 mg/dL (ref 0.0–149.0)
VLDL: 19.8 mg/dL (ref 0.0–40.0)

## 2019-04-29 ENCOUNTER — Other Ambulatory Visit: Payer: Medicare HMO

## 2019-04-29 LAB — FRUCTOSAMINE: Fructosamine: 298 umol/L — ABNORMAL HIGH (ref 0–285)

## 2019-04-30 ENCOUNTER — Encounter: Payer: Self-pay | Admitting: Endocrinology

## 2019-04-30 ENCOUNTER — Ambulatory Visit (INDEPENDENT_AMBULATORY_CARE_PROVIDER_SITE_OTHER): Payer: Medicare HMO | Admitting: Endocrinology

## 2019-04-30 ENCOUNTER — Other Ambulatory Visit: Payer: Self-pay

## 2019-04-30 VITALS — BP 102/60 | HR 72 | Ht 73.0 in | Wt 194.6 lb

## 2019-04-30 DIAGNOSIS — E1165 Type 2 diabetes mellitus with hyperglycemia: Secondary | ICD-10-CM | POA: Diagnosis not present

## 2019-04-30 DIAGNOSIS — I10 Essential (primary) hypertension: Secondary | ICD-10-CM

## 2019-04-30 DIAGNOSIS — Z794 Long term (current) use of insulin: Secondary | ICD-10-CM | POA: Diagnosis not present

## 2019-04-30 MED ORDER — OZEMPIC (0.25 OR 0.5 MG/DOSE) 2 MG/1.5ML ~~LOC~~ SOPN: 0.5000 mg | PEN_INJECTOR | SUBCUTANEOUS | 2 refills | Status: DC

## 2019-04-30 NOTE — Patient Instructions (Signed)
Start OZEMPIC injections by dialing 0.25 mg on the pen as shown once weekly on the same day of the week.   You may inject in the sides of the stomach, outer thigh or arm as indicated in the brochure given. If you have any difficulties using the pen see the video at [email or web address]  You will feel fullness of the stomach with starting the medication and should try to keep the portions at meals small.  You may experience nausea in the first few days which usually gets better over time    After 4 weeks increase the dose to 0.5 mg weekly  If you have any questions or persistent side effects please call the office   You may also talk to a nurse educator with [employer] at [phone] Useful website: [email or web address]     

## 2019-04-30 NOTE — Progress Notes (Signed)
Patient ID: Nicholas Whitehead, male   DOB: 18-Jan-1955, 64 y.o.   MRN: [id number]           Patient ID: Nicholas Whitehead, male   DOB: 14-Nov-1955, 64 y.o.   MRN: [id number]   Reason for Appointment: Type II Diabetes follow-up   History of Present Illness   Diagnosis date: 1998    Previous history: He was started on oral agents at diagnosis and around 2006 was put on insulin His blood sugars are usually well controlled when he takes Actos along with his insulin. He had been on Invokana since 04/2013  He requires large doses of insulin especially basal insulin   Recent history:   Insulin regimen: Toujeo 80 units p.m.;  Novolog 30 AC tid         Oral hypoglycemic drugs: Metformin ER 1000 mg twice a day, Actos 30 mg   A1c is last 7.6 and fructosamine is now 298  Current management, blood sugar patterns and problems:  He is coming back after 2 months for short-term follow-up  Was told to go up on his Toujeo in the last visit if his morning sugars continue to be high but he wanted to first try and improve his diet  Also was reluctant to start a GLP-1 drug in the last visit as recommended  However again his diet has been very inconsistent and he continues to have irregular meals, snacking on carbohydrates for various reasons as before  His blood sugars are the highest in the mornings overall likely from late night snacking  As before he has not been able to exercise much or do any walking  Weight is about the same  Lowest blood sugars are before dinnertime  Because of that time on his meter being 3 hours slow difficult to analyze his blood sugar patterns  He thinks he is taking his NovoLog consistently before each meal     Side effects from medications:  urgency, frequent urination from Invokana Proper timing of medications in relation to meals: Yes.          Monitors blood glucose:  2-3 + times a day  Glucometer:   Accu-Chek  Blood sugar readings from meter download by time of  day  PRE-MEAL Fasting Lunch Dinner Bedtime Overall  Glucose range:  122-282  124-284  133, 127  167-281   Mean/median:  200     187   POST-MEAL PC Breakfast PC Lunch PC Dinner  Glucose range:   76, 302   Mean/median:      Previous readings:   PRE-MEAL Fasting Lunch Dinner Bedtime Overall  Glucose range:  140-214  84, 130  129-260    Mean/median: 190   177  176   POST-MEAL PC Breakfast PC Lunch PC Dinner  Glucose range:    106-224  Mean/median:    178            Meals: Breakfast variable, 9-10 AM Lunch 2-3pm  usually avoiding high-fat meals, dinner at about 6 pm         Physical activity: exercise: doing any formal walking 2/7  Dietician visit: Most recent: 2013    Weight control:  Wt Readings from Last 3 Encounters:  04/30/19 194 lb 9.6 oz (88.3 kg)  03/20/19 194 lb (88 kg)  02/25/19 194 lb 3.2 oz (88.1 kg)          Diabetes labs:  Lab Results  Component Value Date   HGBA1C 7.6 (H) 02/19/2019   HGBA1C 7.0 (  H) 09/13/2018   HGBA1C 7.3 (A) 06/25/2018   Lab Results  Component Value Date   MICROALBUR 1.0 02/19/2019   LDLCALC 75 04/28/2019   CREATININE 0.88 04/28/2019   Lab Results  Component Value Date   FRUCTOSAMINE 298 (H) 04/28/2019   FRUCTOSAMINE 276 09/13/2018   FRUCTOSAMINE 295 (H) 07/09/2018   FRUCTOSAMINE 309 (H) 02/13/2018   FRUCTOSAMINE 270 06/13/2017    Other active problems discussed today: See review of systems   Lab on 04/28/2019  Component Date Value Ref Range Status  . Cholesterol 04/28/2019 130  0 - 200 mg/dL Final   ATP III Classification       Desirable:  < 200 mg/dL               Borderline High:  200 - 239 mg/dL          High:  > = 240 mg/dL  . Triglycerides 04/28/2019 99.0  0.0 - 149.0 mg/dL Final   Normal:  <150 mg/dLBorderline High:  150 - 199 mg/dL  . HDL 04/28/2019 35.40* >39.00 mg/dL Final  . VLDL 04/28/2019 19.8  0.0 - 40.0 mg/dL Final  . LDL Cholesterol 04/28/2019 75  0 - 99 mg/dL Final  . Total CHOL/HDL Ratio 04/28/2019  4   Final                  Men          Women1/2 Average Risk     3.4          3.3Average Risk          5.0          4.42X Average Risk          9.6          7.13X Average Risk          15.0          11.0                      . NonHDL 04/28/2019 94.63   Final   NOTE:  Non-HDL goal should be 30 mg/dL higher than patient's LDL goal (i.e. LDL goal of < 70 mg/dL, would have non-HDL goal of < 100 mg/dL)  . Sodium 04/28/2019 138  135 - 145 mEq/L Final  . Potassium 04/28/2019 3.7  3.5 - 5.1 mEq/L Final  . Chloride 04/28/2019 99  96 - 112 mEq/L Final  . CO2 04/28/2019 34* 19 - 32 mEq/L Final  . Glucose, Bld 04/28/2019 134* 70 - 99 mg/dL Final  . BUN 04/28/2019 9  6 - 23 mg/dL Final  . Creatinine, Ser 04/28/2019 0.88  0.40 - 1.50 mg/dL Final  . GFR 04/28/2019 106.36  >60.00 mL/min Final  . Calcium 04/28/2019 10.0  8.4 - 10.5 mg/dL Final  . Fructosamine 04/28/2019 298* 0 - 285 umol/L Final   Comment: Published reference interval for apparently healthy subjects between age 57 and 67 is 71 - 285 umol/L and in a poorly controlled diabetic population is 228 - 563 umol/L with a mean of 396 umol/L.      Allergies as of 04/30/2019      Reactions   Benazepril Cough   Pravastatin Other (See Comments)   myositis   Glipizide Nausea Only      Medication List       Accurate as of April 30, 2019  1:46 PM. If you have any questions, ask your nurse or doctor.  Accu-Chek Guide test strip Generic drug: glucose blood TEST BLOOD SUGAR FOUR TIMES DAILY   albuterol 108 (90 Base) MCG/ACT inhaler Commonly known as: VENTOLIN HFA Inhale 2 puffs into the lungs every 6 (six) hours as needed for wheezing or shortness of breath.   aspirin EC 81 MG tablet Take 81 mg by mouth every morning.   B-D SINGLE USE SWABS REGULAR Pads USE AS DIRECTED   Bevespi Aerosphere 9-4.8 MCG/ACT Aero Generic drug: Glycopyrrolate-Formoterol INHALE 2 PUFFS INTO THE LUNGS TWO TIMES DAILY   Bystolic 20 MG Tabs Generic  drug: Nebivolol HCl TAKE 1 TABLET EVERY DAY   cetirizine 10 MG tablet Commonly known as: ZYRTEC Take 10 mg by mouth daily.   Cholecalciferol 50 MCG (2000 UT) Tabs Take 1 tablet (2,000 Units total) by mouth daily.   Droplet Pen Needles 31G X 8 MM Misc Generic drug: Insulin Pen Needle USE TO INJECT INSULIN   DULoxetine 60 MG capsule Commonly known as: CYMBALTA TAKE 1 CAPSULE EVERY DAY   Eszopiclone 3 MG Tabs TAKE 1 TABLET BY MOUTH IMMEDIATELY BEFORE BEDTIME   furosemide 20 MG tablet Commonly known as: LASIX TAKE 1 TABLET TWICE DAILY   gabapentin 300 MG capsule Commonly known as: NEURONTIN TAKE 1 CAPSULE THREE TIMES DAILY   Lancets Misc Use to test blood sugar tid. DX: E11.8   linaclotide 72 MCG capsule Commonly known as: Linzess Take 1 capsule (72 mcg total) by mouth daily before breakfast.   Livalo 1 MG Tabs Generic drug: Pitavastatin Calcium Take 1 tablet (1 mg total) by mouth daily.   metFORMIN 500 MG 24 hr tablet Commonly known as: GLUCOPHAGE-XR TAKE 2 TABLETS BY MOUTH TWICE A DAY   NovoLOG FlexPen 100 UNIT/ML FlexPen Generic drug: insulin aspart ADMINISTER 25 TO 30 UNITS UNDER THE SKIN THREE TIMES DAILY BEFORE MEALS. MAY TAKE LESS IF SUGAR IS BELOW 120 OR EATING A SMALL MEAL   Odefsey 200-25-25 MG Tabs tablet Generic drug: emtricitabine-rilpivir-tenofovir AF TAKE 1 TABLET BY MOUTH EVERY DAY   olmesartan-hydrochlorothiazide 40-12.5 MG tablet Commonly known as: BENICAR HCT TAKE 1 TABLET EVERY DAY   pioglitazone 30 MG tablet Commonly known as: ACTOS TAKE 1 TABLET EVERY DAY   Toujeo Max SoloStar 300 UNIT/ML Solostar Pen Generic drug: insulin glargine (2 Unit Dial) Inject 86 Units into the skin daily.   traZODone 150 MG tablet Commonly known as: DESYREL Take 3 tablets (450 mg total) by mouth at bedtime.   triamcinolone cream 0.5 % Commonly known as: KENALOG APPLY 1 APPLICATION TOPICALLY THREE TIME A DAY       Allergies:  Allergies  Allergen  Reactions  . Benazepril Cough  . Pravastatin Other (See Comments)    myositis  . Glipizide Nausea Only    Past Medical History:  Diagnosis Date  . Anxiety   . Cataract    right   . Depression   . Diabetes mellitus without complication ([place])   . DM (diabetes mellitus screen)   . HIV disease ([place]) 09/12/1995  . HIV infection ([place])   . Hypercholesterolemia   . Hyperlipidemia   . Hypertension     Past Surgical History:  Procedure Laterality Date  . COLONOSCOPY WITH PROPOFOL  01/16/2012   Procedure: COLONOSCOPY WITH PROPOFOL;  Surgeon: Garlan Fair, MD;  Location: [facility];  Service: Endoscopy;  Laterality: N/A;  . EYE SURGERY  2005   catarct surgery  . optic lens surgery  2005    Family History  Problem Relation Age of Onset  .  Arthritis Mother   . Diabetes Mother   . Heart disease Mother   . Stroke Mother   . Arthritis Father   . Diabetes Father   . Heart disease Father     Social History:  reports that he quit smoking about 2 years ago. His smoking use included cigarettes. He smoked 0.25 packs per day. He has never used smokeless tobacco. He reports that he does not drink alcohol or use drugs.  Review of Systems:   Hypertension:  on Bystolic  and on Benicar HCT 1/2, previously on losartan, followed by PCP Also on Lasix as needed  BP Readings from Last 3 Encounters:  04/30/19 102/60  03/20/19 (!) 148/62  02/25/19 140/68   RENAL function: Now fairly consistently normal  Lab Results  Component Value Date   CREATININE 0.88 04/28/2019   CREATININE 0.94 02/19/2019   CREATININE 1.11 09/13/2018    Lipids: LDL is normal as follows Treatment prescribed by PCP, currently on Livalo   Lab Results  Component Value Date   CHOL 130 04/28/2019   HDL 35.40 (L) 04/28/2019   LDLCALC 75 04/28/2019   TRIG 99.0 04/28/2019   CHOLHDL 4 04/28/2019    Neuropathy: Has had paresthesiae in feet and legs, these are controlled with gabapentin 300 mg 3 times a day       LABS:  Lab on 04/28/2019  Component Date Value Ref Range Status  . Cholesterol 04/28/2019 130  0 - 200 mg/dL Final   ATP III Classification       Desirable:  < 200 mg/dL               Borderline High:  200 - 239 mg/dL          High:  > = 240 mg/dL  . Triglycerides 04/28/2019 99.0  0.0 - 149.0 mg/dL Final   Normal:  <150 mg/dLBorderline High:  150 - 199 mg/dL  . HDL 04/28/2019 35.40* >39.00 mg/dL Final  . VLDL 04/28/2019 19.8  0.0 - 40.0 mg/dL Final  . LDL Cholesterol 04/28/2019 75  0 - 99 mg/dL Final  . Total CHOL/HDL Ratio 04/28/2019 4   Final                  Men          Women1/2 Average Risk     3.4          3.3Average Risk          5.0          4.42X Average Risk          9.6          7.13X Average Risk          15.0          11.0                      . NonHDL 04/28/2019 94.63   Final   NOTE:  Non-HDL goal should be 30 mg/dL higher than patient's LDL goal (i.e. LDL goal of < 70 mg/dL, would have non-HDL goal of < 100 mg/dL)  . Sodium 04/28/2019 138  135 - 145 mEq/L Final  . Potassium 04/28/2019 3.7  3.5 - 5.1 mEq/L Final  . Chloride 04/28/2019 99  96 - 112 mEq/L Final  . CO2 04/28/2019 34* 19 - 32 mEq/L Final  . Glucose, Bld 04/28/2019 134* 70 - 99 mg/dL Final  . BUN 04/28/2019 9  6 - 23  mg/dL Final  . Creatinine, Ser 04/28/2019 0.88  0.40 - 1.50 mg/dL Final  . GFR 04/28/2019 106.36  >60.00 mL/min Final  . Calcium 04/28/2019 10.0  8.4 - 10.5 mg/dL Final  . Fructosamine 04/28/2019 298* 0 - 285 umol/L Final   Comment: Published reference interval for apparently healthy subjects between age 17 and 51 is 71 - 285 umol/L and in a poorly controlled diabetic population is 228 - 563 umol/L with a mean of 396 umol/L.      Examination:   BP 102/60 (BP Location: Left Arm, Patient Position: Sitting, Cuff Size: Normal)   Pulse 72   Ht 6\' 1"  (1.854 m)   Wt 194 lb 9.6 oz (88.3 kg)   SpO2 93%   BMI 25.67 kg/m   Body mass index is 25.67 kg/m.   No ankle edema present  ASSESSMENT/  PLAN:    Diabetes type 2:  See history of present illness for detailed discussion of his current management, blood sugar patterns and problems identified  His A1c in February was 7.6 Fructosamine is 298 which is higher than his previous level indicating only fair control  Blood sugars are averaging 187 at home now Most of his hyperglycemia is overnight with poor diet With his depression, financial limitations and stress eating he is not able to control his blood sugars Again taking large amounts of insulin Blood sugar meter download was analyzed but there is no consistent pattern  He may benefit from a GLP-1 drug and he is now open to this idea    Recommendations:  Discussed with the patient the nature of GLP-1 drugs, the action on various organ systems, improved satiety, how they benefit blood glucose control, as well as the benefit of weight loss.  Explained possible side effects of weekly Ozempic, most commonly nausea that usually improves over time; discussed safety information in package insert.  Demonstrated the medication injection device and injection technique to the patient.   To start with 0.25 mg dosage weekly for the first 4 injections and then 0.5 mg weekly if no nausea  Patient brochure on Ozempic including dosage card given  Discussed that if his blood sugars are low normal he should be able to lower his NovoLog between 25-30 units at meals He will call if he has any difficulties A1c on the next visit   Hypertension  blood pressure is consistently controlled now   LIPIDS: LDL is 75 with Livalo and he will continue this  There are no Patient Instructions on file for this visit.       Elayne Snare 04/30/2019, 1:46 PM           ..

## 2019-05-02 ENCOUNTER — Ambulatory Visit: Payer: Medicare HMO | Admitting: Endocrinology

## 2019-06-05 ENCOUNTER — Other Ambulatory Visit: Payer: Self-pay | Admitting: Internal Medicine

## 2019-06-05 DIAGNOSIS — I10 Essential (primary) hypertension: Secondary | ICD-10-CM

## 2019-06-11 ENCOUNTER — Encounter: Payer: Self-pay | Admitting: Podiatry

## 2019-06-11 ENCOUNTER — Ambulatory Visit (INDEPENDENT_AMBULATORY_CARE_PROVIDER_SITE_OTHER): Payer: Medicare HMO | Admitting: Podiatry

## 2019-06-11 ENCOUNTER — Other Ambulatory Visit: Payer: Self-pay

## 2019-06-11 DIAGNOSIS — M79674 Pain in right toe(s): Secondary | ICD-10-CM

## 2019-06-11 DIAGNOSIS — E1140 Type 2 diabetes mellitus with diabetic neuropathy, unspecified: Secondary | ICD-10-CM | POA: Diagnosis not present

## 2019-06-11 DIAGNOSIS — L84 Corns and callosities: Secondary | ICD-10-CM

## 2019-06-11 DIAGNOSIS — Z794 Long term (current) use of insulin: Secondary | ICD-10-CM | POA: Diagnosis not present

## 2019-06-11 DIAGNOSIS — B351 Tinea unguium: Secondary | ICD-10-CM | POA: Diagnosis not present

## 2019-06-11 DIAGNOSIS — M79675 Pain in left toe(s): Secondary | ICD-10-CM | POA: Diagnosis not present

## 2019-06-11 NOTE — Patient Instructions (Signed)
Diabetes Mellitus and Foot Care Foot care is an important part of your health, especially when you have diabetes. Diabetes may cause you to have problems because of poor blood flow (circulation) to your feet and legs, which can cause your skin to:  Become thinner and drier.  Break more easily.  Heal more slowly.  Peel and crack. You may also have nerve damage (neuropathy) in your legs and feet, causing decreased feeling in them. This means that you may not notice minor injuries to your feet that could lead to more serious problems. Noticing and addressing any potential problems early is the best way to prevent future foot problems. How to care for your feet Foot hygiene  Wash your feet daily with warm water and mild soap. Do not use hot water. Then, pat your feet and the areas between your toes until they are completely dry. Do not soak your feet as this can dry your skin.  Trim your toenails straight across. Do not dig under them or around the cuticle. File the edges of your nails with an emery board or nail file.  Apply a moisturizing lotion or petroleum jelly to the skin on your feet and to dry, brittle toenails. Use lotion that does not contain alcohol and is unscented. Do not apply lotion between your toes. Shoes and socks  Wear clean socks or stockings every day. Make sure they are not too tight. Do not wear knee-high stockings since they may decrease blood flow to your legs.  Wear shoes that fit properly and have enough cushioning. Always look in your shoes before you put them on to be sure there are no objects inside.  To break in new shoes, wear them for just a few hours a day. This prevents injuries on your feet. Wounds, scrapes, corns, and calluses  Check your feet daily for blisters, cuts, bruises, sores, and redness. If you cannot see the bottom of your feet, use a mirror or ask someone for help.  Do not cut corns or calluses or try to remove them with medicine.  If you  find a minor scrape, cut, or break in the skin on your feet, keep it and the skin around it clean and dry. You may clean these areas with mild soap and water. Do not clean the area with peroxide, alcohol, or iodine.  If you have a wound, scrape, corn, or callus on your foot, look at it several times a day to make sure it is healing and not infected. Check for: ? Redness, swelling, or pain. ? Fluid or blood. ? Warmth. ? Pus or a bad smell. General instructions  Do not cross your legs. This may decrease blood flow to your feet.  Do not use heating pads or hot water bottles on your feet. They may burn your skin. If you have lost feeling in your feet or legs, you may not know this is happening until it is too late.  Protect your feet from hot and cold by wearing shoes, such as at the beach or on hot pavement.  Schedule a complete foot exam at least once a year (annually) or more often if you have foot problems. If you have foot problems, report any cuts, sores, or bruises to your health care provider immediately. Contact a health care provider if:  You have a medical condition that increases your risk of infection and you have any cuts, sores, or bruises on your feet.  You have an injury that is not   healing.  You have redness on your legs or feet.  You feel burning or tingling in your legs or feet.  You have pain or cramps in your legs and feet.  Your legs or feet are numb.  Your feet always feel cold.  You have pain around a toenail. Get help right away if:  You have a wound, scrape, corn, or callus on your foot and: ? You have pain, swelling, or redness that gets worse. ? You have fluid or blood coming from the wound, scrape, corn, or callus. ? Your wound, scrape, corn, or callus feels warm to the touch. ? You have pus or a bad smell coming from the wound, scrape, corn, or callus. ? You have a fever. ? You have a red line going up your leg. Summary  Check your feet every day  for cuts, sores, red spots, swelling, and blisters.  Moisturize feet and legs daily.  Wear shoes that fit properly and have enough cushioning.  If you have foot problems, report any cuts, sores, or bruises to your health care provider immediately.  Schedule a complete foot exam at least once a year (annually) or more often if you have foot problems. This information is not intended to replace advice given to you by your health care provider. Make sure you discuss any questions you have with your health care provider. Document Revised: 09/18/2018 Document Reviewed: 01/28/2016 [employer] Patient Education  2020 [employer]  

## 2019-06-15 NOTE — Progress Notes (Signed)
Subjective: Nicholas Whitehead presents today at risk foot care. Pt has h/o NIDDM with chronic kidney disease and painful callus(es) b/l heels and painful mycotic toenails b/l that are difficult to trim. Pain interferes with ambulation. Aggravating factors include wearing enclosed shoe gear. Pain is relieved with periodic professional debridement.   He also has subjective symptoms of neuropathy and takes gabapentin for his neuropathic symptoms. He voices no new pedal concerns on today's visit.  Nicholas Lima, MD is patient's PCP. Last visit was: 03/20/2019. He is followed by Endocrinologist, Nicholas Whitehead for his diabetes.  Past Medical History:  Diagnosis Date  . Anxiety   . Cataract    right   . Depression   . Diabetes mellitus without complication ([place])   . DM (diabetes mellitus screen)   . HIV disease ([place]) 09/12/1995  . HIV infection ([place])   . Hypercholesterolemia   . Hyperlipidemia   . Hypertension      Current Outpatient Medications on File Prior to Visit  Medication Sig Dispense Refill  . albuterol (PROVENTIL HFA;VENTOLIN HFA) 108 (90 Base) MCG/ACT inhaler Inhale 2 puffs into the lungs every 6 (six) hours as needed for wheezing or shortness of breath. 1 Inhaler 5  . Alcohol Swabs (B-D SINGLE USE SWABS REGULAR) PADS USE AS DIRECTED 300 each 1  . aspirin EC 81 MG tablet Take 81 mg by mouth every morning.    Marland Kitchen BEVESPI AEROSPHERE 9-4.8 MCG/ACT AERO INHALE 2 PUFFS INTO THE LUNGS TWO TIMES DAILY 32.1 g 1  . BYSTOLIC 20 MG TABS TAKE 1 TABLET EVERY DAY 90 tablet 1  . cetirizine (ZYRTEC) 10 MG tablet Take 10 mg by mouth daily.    . Cholecalciferol 2000 units TABS Take 1 tablet (2,000 Units total) by mouth daily. 90 tablet 1  . DROPLET PEN NEEDLES 31G X 8 MM MISC USE TO INJECT INSULIN 500 each 1  . DULoxetine (CYMBALTA) 60 MG capsule TAKE 1 CAPSULE EVERY DAY 90 capsule 1  . Eszopiclone 3 MG TABS TAKE 1 TABLET BY MOUTH IMMEDIATELY BEFORE BEDTIME 90 tablet 1  . furosemide (LASIX) 20 MG  tablet TAKE 1 TABLET TWICE DAILY 180 tablet 1  . gabapentin (NEURONTIN) 300 MG capsule TAKE 1 CAPSULE THREE TIMES DAILY 270 capsule 1  . glucose blood (ACCU-CHEK GUIDE) test strip TEST BLOOD SUGAR FOUR TIMES DAILY 400 strip 3  . insulin glargine, 2 Unit Dial, (TOUJEO MAX SOLOSTAR) 300 UNIT/ML Solostar Pen Inject 86 Units into the skin daily. 6.45 mL 2  . Lancets MISC Use to test blood sugar tid. DX: E11.8 300 each 3  . linaclotide (LINZESS) 72 MCG capsule Take 1 capsule (72 mcg total) by mouth daily before breakfast. 90 capsule 1  . metFORMIN (GLUCOPHAGE-XR) 500 MG 24 hr tablet TAKE 2 TABLETS BY MOUTH TWICE A DAY 360 tablet 1  . NOVOLOG FLEXPEN 100 UNIT/ML FlexPen ADMINISTER 25 TO 30 UNITS UNDER THE SKIN THREE TIMES DAILY BEFORE MEALS. MAY TAKE LESS IF SUGAR IS BELOW 120 OR EATING A SMALL MEAL 30 mL 1  . ODEFSEY 200-25-25 MG TABS tablet TAKE 1 TABLET BY MOUTH EVERY DAY 30 tablet 5  . olmesartan-hydrochlorothiazide (BENICAR HCT) 40-12.5 MG tablet TAKE 1 TABLET EVERY DAY 90 tablet 1  . pioglitazone (ACTOS) 30 MG tablet TAKE 1 TABLET EVERY DAY 90 tablet 1  . Pitavastatin Calcium (LIVALO) 1 MG TABS Take 1 tablet (1 mg total) by mouth daily. 90 tablet 1  . Semaglutide,0.25 or 0.5MG /DOS, (OZEMPIC, 0.25 OR 0.5 MG/DOSE,) 2 MG/1.5ML  SOPN Inject 0.5 mg into the skin once a week. Start with 0.25mg  weekly for 4 weeks 1 pen 2  . traZODone (DESYREL) 150 MG tablet Take 3 tablets (450 mg total) by mouth at bedtime. 270 tablet 1  . triamcinolone cream (KENALOG) 0.5 % APPLY 1 APPLICATION TOPICALLY THREE TIME A DAY 100 g 1  . [DISCONTINUED] Alcohol Swabs (B-D SINGLE USE SWABS REGULAR) PADS USE AS DIRECTED 300 each 1  . [DISCONTINUED] DULoxetine (CYMBALTA) 60 MG capsule TAKE 1 CAPSULE EVERY DAY 90 capsule 1  . [DISCONTINUED] furosemide (LASIX) 20 MG tablet TAKE 1 TABLET TWICE DAILY 180 tablet 1  . [DISCONTINUED] metFORMIN (GLUCOPHAGE-XR) 500 MG 24 hr tablet TAKE 2 TABLETS BY MOUTH TWICE DAILY 360 tablet 1  .  [DISCONTINUED] Nebivolol HCl (BYSTOLIC) 20 MG TABS Take 1 tablet (20 mg total) by mouth daily. 90 tablet 1  . [DISCONTINUED] olmesartan-hydrochlorothiazide (BENICAR HCT) 40-12.5 MG tablet TAKE 1 TABLET EVERY DAY 90 tablet 1  . [DISCONTINUED] pioglitazone (ACTOS) 30 MG tablet Take 1 tablet (30 mg total) by mouth daily. 90 tablet 1   No current facility-administered medications on file prior to visit.     Allergies  Allergen Reactions  . Benazepril Cough  . Pravastatin Other (See Comments)    myositis  . Glipizide Nausea Only    Objective: Nicholas Whitehead is a pleasant 64 y.o. y.o. Patient Race: Black or African American [2]  male in NAD. AAO x 3.  There were no vitals filed for this visit.  Vascular Examination: Capillary refill time to digits immediate b/l. Faintly palpable DP pulses b/l. Nonpalpable PT pulse(s) b/l lower extremities. Pedal hair absent b/l. Skin temperature gradient warm to cool b/l.  Dermatological Examination: Pedal skin with normal turgor, texture and tone bilaterally. No open wounds bilaterally. No interdigital macerations bilaterally. Toenails 1-5 b/l elongated, discolored, dystrophic, thickened, crumbly with subungual debris and tenderness to dorsal palpation. Hyperkeratotic lesion(s) plantar aspect b/l heel pads.  No erythema, no edema, no drainage, no flocculence.  Musculoskeletal: Normal muscle strength 5/5 to all lower extremity muscle groups bilaterally. No pain crepitus or joint limitation noted with ROM b/l. Hammertoes noted to the 2-5 bilaterally. Pes planus deformity noted b/l.   Neurological Examination: Pt has subjective symptoms of neuropathy. Protective sensation decreased with 10 gram monofilament b/l. Vibratory sensation intact b/l. Proprioception intact bilaterally.  Assessment: 1. Pain due to onychomycosis of toenails of both feet   2. Callus   3. Type 2 diabetes mellitus with diabetic neuropathy, with long-term current use of insulin ([place])      Plan: -Examined patient. -No new findings. No new orders. -Toenails 1-5 b/l were debrided in length and girth with sterile nail nippers and dremel without iatrogenic bleeding.  -Callus(es) plantar aspect b/l heel pads pared utilizing sterile scalpel blade without complication or incident. Total number debrided =2. -Patient to continue soft, supportive shoe gear daily. -Patient to report any pedal injuries to medical professional immediately. -Patient/POA to call should there be question/concern in the interim.  Return in about 3 months (around 09/11/2019) for diabetic nail and callus trim.  Marzetta Board, DPM

## 2019-06-16 ENCOUNTER — Other Ambulatory Visit (HOSPITAL_COMMUNITY)
Admission: RE | Admit: 2019-06-16 | Discharge: 2019-06-16 | Disposition: A | Discharge status: Home / Self Care | Payer: Medicare HMO | Source: Ambulatory Visit | Attending: Internal Medicine | Admitting: Internal Medicine

## 2019-06-16 ENCOUNTER — Other Ambulatory Visit: Payer: Medicare HMO

## 2019-06-16 ENCOUNTER — Other Ambulatory Visit: Payer: Self-pay

## 2019-06-16 DIAGNOSIS — Z113 Encounter for screening for infections with a predominantly sexual mode of transmission: Secondary | ICD-10-CM | POA: Diagnosis not present

## 2019-06-16 DIAGNOSIS — B20 Human immunodeficiency virus [HIV] disease: Secondary | ICD-10-CM

## 2019-06-17 LAB — URINE CYTOLOGY ANCILLARY ONLY
Chlamydia: NEGATIVE
Comment: NEGATIVE
Comment: NORMAL
Neisseria Gonorrhea: NEGATIVE

## 2019-06-17 LAB — T-HELPER CELL (CD4) - (RCID CLINIC ONLY)
CD4 % Helper T Cell: 40 % (ref 33–65)
CD4 T Cell Abs: 1107 /uL (ref 400–1790)

## 2019-06-18 LAB — RPR: RPR Ser Ql: NONREACTIVE

## 2019-06-18 LAB — HIV-1 RNA QUANT-NO REFLEX-BLD
HIV 1 RNA Quant: <20 copies/mL — AB
HIV-1 RNA Quant, Log: <1.3 Log copies/mL — AB

## 2019-06-22 ENCOUNTER — Other Ambulatory Visit: Payer: Self-pay | Admitting: Internal Medicine

## 2019-06-22 DIAGNOSIS — B20 Human immunodeficiency virus [HIV] disease: Secondary | ICD-10-CM

## 2019-06-25 LAB — HM DIABETES EYE EXAM

## 2019-07-02 ENCOUNTER — Ambulatory Visit (INDEPENDENT_AMBULATORY_CARE_PROVIDER_SITE_OTHER): Payer: Medicare HMO | Admitting: Internal Medicine

## 2019-07-02 ENCOUNTER — Encounter: Payer: Self-pay | Admitting: Internal Medicine

## 2019-07-02 ENCOUNTER — Other Ambulatory Visit: Payer: Self-pay

## 2019-07-02 VITALS — BP 150/78 | HR 58 | Temp 98.3°F | Wt 188.0 lb

## 2019-07-02 DIAGNOSIS — F329 Major depressive disorder, single episode, unspecified: Secondary | ICD-10-CM

## 2019-07-02 DIAGNOSIS — B20 Human immunodeficiency virus [HIV] disease: Secondary | ICD-10-CM | POA: Diagnosis not present

## 2019-07-02 DIAGNOSIS — L309 Dermatitis, unspecified: Secondary | ICD-10-CM | POA: Diagnosis not present

## 2019-07-02 DIAGNOSIS — F32A Depression, unspecified: Secondary | ICD-10-CM

## 2019-07-02 DIAGNOSIS — F419 Anxiety disorder, unspecified: Secondary | ICD-10-CM | POA: Diagnosis not present

## 2019-07-02 DIAGNOSIS — Z113 Encounter for screening for infections with a predominantly sexual mode of transmission: Secondary | ICD-10-CM | POA: Diagnosis not present

## 2019-07-02 MED ORDER — TRIAMCINOLONE ACETONIDE 0.5 % EX CREA: TOPICAL_CREAM | CUTANEOUS | 3 refills | Status: DC

## 2019-07-02 NOTE — Assessment & Plan Note (Signed)
He is doing well with his medications and no issues.  Labs reassuring.   rtc in 6 months

## 2019-07-02 NOTE — Assessment & Plan Note (Signed)
I will refill his Kenalog

## 2019-07-02 NOTE — Assessment & Plan Note (Signed)
I had a discussion with him regarding his depression.  I encouraged activities, exercise though he did not express any interest.  He denies SI.   I encouraged counseling but he remains uninterested or in pharmacotherapy. Will continue to monitor and he knows to call if he has any concerns.

## 2019-07-02 NOTE — Progress Notes (Signed)
Patient ID: Nicholas Whitehead, male   DOB: [birth year], 64 y.o.   MRN: [id number]   Subjective:    Patient ID: Nicholas Whitehead, male    DOB: 1955-02-12, 64 y.o.   MRN: [id number]  HPI Here for follow up of HIV He continues on Odefsey and no missed doses.  CD4 1107, viral load < 20.  He continues to feel isolated and depressed.  No SI.  He continues not to be interested in counseling.     Review of Systems  Constitutional: Negative for fatigue.  Gastrointestinal: Negative for diarrhea and nausea.  Skin: Negative for rash.       Objective:   Physical Exam Constitutional:      Appearance: He is well-developed.  Eyes:     General: No scleral icterus. Cardiovascular:     Rate and Rhythm: Normal rate and regular rhythm.     Heart sounds: Normal heart sounds.  Pulmonary:     Effort: Pulmonary effort is normal.  Skin:    Findings: No rash.  Neurological:     General: No focal deficit present.     Mental Status: He is alert.  Psychiatric:        Mood and Affect: Mood normal.     Comments: Flat affect         Assessment & Plan:

## 2019-07-07 ENCOUNTER — Telehealth: Payer: Self-pay | Admitting: Endocrinology

## 2019-07-07 ENCOUNTER — Other Ambulatory Visit (INDEPENDENT_AMBULATORY_CARE_PROVIDER_SITE_OTHER): Payer: Medicare HMO

## 2019-07-07 ENCOUNTER — Other Ambulatory Visit: Payer: Self-pay

## 2019-07-07 DIAGNOSIS — E1165 Type 2 diabetes mellitus with hyperglycemia: Secondary | ICD-10-CM | POA: Diagnosis not present

## 2019-07-07 DIAGNOSIS — Z794 Long term (current) use of insulin: Secondary | ICD-10-CM

## 2019-07-07 LAB — BASIC METABOLIC PANEL
BUN: 7 mg/dL (ref 6–23)
CO2: 30 mEq/L (ref 19–32)
Calcium: 9.8 mg/dL (ref 8.4–10.5)
Chloride: 102 mEq/L (ref 96–112)
Creatinine, Ser: 0.94 mg/dL (ref 0.40–1.50)
GFR: 98.5 mL/min (ref >60.00)
Glucose, Bld: 108 mg/dL — ABNORMAL HIGH (ref 70–99)
Potassium: 4.1 mEq/L (ref 3.5–5.1)
Sodium: 140 mEq/L (ref 135–145)

## 2019-07-07 LAB — HEMOGLOBIN A1C: Hgb A1c MFr Bld: 7 % — ABNORMAL HIGH (ref 4.6–6.5)

## 2019-07-07 NOTE — Telephone Encounter (Signed)
Patient dropped off forms for Diabetic Shoes. Patient has an appointment with Dr. Dwyane Dee on 07/10/19. Forms have been placed in Dr. Ronnie Derby Assistant's box.

## 2019-07-07 NOTE — Telephone Encounter (Signed)
Noted  

## 2019-07-10 ENCOUNTER — Encounter: Payer: Self-pay | Admitting: Endocrinology

## 2019-07-10 ENCOUNTER — Ambulatory Visit (INDEPENDENT_AMBULATORY_CARE_PROVIDER_SITE_OTHER): Payer: Medicare HMO | Admitting: Endocrinology

## 2019-07-10 ENCOUNTER — Other Ambulatory Visit: Payer: Self-pay

## 2019-07-10 VITALS — BP 148/76 | HR 72 | Ht 73.0 in | Wt 184.2 lb

## 2019-07-10 DIAGNOSIS — E1151 Type 2 diabetes mellitus with diabetic peripheral angiopathy without gangrene: Secondary | ICD-10-CM | POA: Diagnosis not present

## 2019-07-10 DIAGNOSIS — Z794 Long term (current) use of insulin: Secondary | ICD-10-CM

## 2019-07-10 DIAGNOSIS — E1140 Type 2 diabetes mellitus with diabetic neuropathy, unspecified: Secondary | ICD-10-CM | POA: Diagnosis not present

## 2019-07-10 DIAGNOSIS — E1165 Type 2 diabetes mellitus with hyperglycemia: Secondary | ICD-10-CM | POA: Diagnosis not present

## 2019-07-10 NOTE — Patient Instructions (Signed)
Ozempic 0.25mg  weekly  Novolog 25 when sugars < 100  Check blood sugars on waking up 3 days a week  Also check blood sugars about 2 hours after meals and do this after different meals by rotation  Recommended blood sugar levels on waking up are 90-130 and about 2 hours after meal is 130-160  Please bring your blood sugar monitor to each visit, thank you

## 2019-07-10 NOTE — Progress Notes (Signed)
Patient ID: Nicholas Whitehead, male   DOB: Jan 13, 1955, 64 y.o.   MRN: [id number]           Patient ID: Nicholas Whitehead, male   DOB: 1955/11/08, 64 y.o.   MRN: [id number]   Reason for Appointment: Type II Diabetes follow-up   History of Present Illness   Diagnosis date: 1998    Previous history: He was started on oral agents at diagnosis and around 2006 was put on insulin His blood sugars are usually well controlled when he takes Actos along with his insulin. He had been on Invokana since 04/2013  He requires large doses of insulin especially basal insulin   Recent history:   Insulin regimen: Toujeo 80 units p.m.;  Novolog 30 AC tid         Oral hypoglycemic drugs: Metformin ER 1000 mg twice a day, Actos 30 mg   A1c is 7%, was 7.6  Previous fructosamine 298  Current management, blood sugar patterns and problems:  He is coming back after starting Ozempic on his last visit 2 months ago  He said that he increase his Ozempic to 0.5 mg after only 3 injections of the 0.25 and subsequently had some nausea and diarrhea and felt bad.  He did not let us know about this and stopped his Ozempic on his own  His meter could not be downloaded today  However his blood sugars appear to be significantly improved, was 108 in the lab midmorning  Previously morning readings are averaging 200  He did not have any hypoglycemia with adding Ozempic although blood sugar occasionally was 80 previously  He does not think he had to reduce his NovoLog when taking Ozempic  Also no overnight hypoglycemia and he thinks his morning readings are fairly good as below  Also he thinks his blood sugars are rising recently although not clear how often he is having readings over 200     Side effects from medications:  urgency, frequent urination from Invokana Proper timing of medications in relation to meals: Yes.          Monitors blood glucose:  2-3 + times a day  Glucometer:   Accu-Chek  Blood sugar readings  from recall Highest reading 260  PRE-MEAL Fasting Lunch Dinner Bedtime Overall  Glucose range:  About 120      Mean/median:        POST-MEAL PC Breakfast PC Lunch PC Dinner  Glucose range:    150-170  Mean/median:      Previous readings:   PRE-MEAL Fasting Lunch Dinner Bedtime Overall  Glucose range:  122-282  124-284  133, 127  167-281   Mean/median:  200     187   POST-MEAL PC Breakfast PC Lunch PC Dinner  Glucose range:   76, 302   Mean/median:              Meals: Breakfast variable, 9-10 AM Lunch 2-3pm  usually avoiding high-fat meals, dinner at about 6 pm         Physical activity: exercise: doing any formal walking 2/7  Dietician visit: Most recent: 2013    Weight control:  Wt Readings from Last 3 Encounters:  07/10/19 184 lb 3.2 oz (83.6 kg)  07/02/19 188 lb (85.3 kg)  04/30/19 194 lb 9.6 oz (88.3 kg)          Diabetes labs:  Lab Results  Component Value Date   HGBA1C 7.0 (H) 07/07/2019   HGBA1C 7.6 (H) 02/19/2019   HGBA1C  7.0 (H) 09/13/2018   Lab Results  Component Value Date   MICROALBUR 1.0 02/19/2019   LDLCALC 75 04/28/2019   CREATININE 0.94 07/07/2019   Lab Results  Component Value Date   FRUCTOSAMINE 298 (H) 04/28/2019   FRUCTOSAMINE 276 09/13/2018   FRUCTOSAMINE 295 (H) 07/09/2018   FRUCTOSAMINE 309 (H) 02/13/2018   FRUCTOSAMINE 270 06/13/2017    Other active problems discussed today: See review of systems   Lab on 07/07/2019  Component Date Value Ref Range Status  . Sodium 07/07/2019 140  135 - 145 mEq/L Final  . Potassium 07/07/2019 4.1  3.5 - 5.1 mEq/L Final  . Chloride 07/07/2019 102  96 - 112 mEq/L Final  . CO2 07/07/2019 30  19 - 32 mEq/L Final  . Glucose, Bld 07/07/2019 108* 70 - 99 mg/dL Final  . BUN 07/07/2019 7  6 - 23 mg/dL Final  . Creatinine, Ser 07/07/2019 0.94  0.40 - 1.50 mg/dL Final  . GFR 07/07/2019 98.50  >60.00 mL/min Final  . Calcium 07/07/2019 9.8  8.4 - 10.5 mg/dL Final  . Hgb A1c MFr Bld 07/07/2019 7.0*  4.6 - 6.5 % Final   Glycemic Control Guidelines for People with Diabetes:Non Diabetic:  <6%Goal of Therapy: <7%Additional Action Suggested:  >8%      Allergies as of 07/10/2019      Reactions   Benazepril Cough   Pravastatin Other (See Comments)   myositis   Glipizide Nausea Only      Medication List       Accurate as of July 10, 2019 12:20 PM. If you have any questions, ask your nurse or doctor.        Accu-Chek Guide test strip Generic drug: glucose blood TEST BLOOD SUGAR FOUR TIMES DAILY   albuterol 108 (90 Base) MCG/ACT inhaler Commonly known as: VENTOLIN HFA Inhale 2 puffs into the lungs every 6 (six) hours as needed for wheezing or shortness of breath.   aspirin EC 81 MG tablet Take 81 mg by mouth every morning.   B-D SINGLE USE SWABS REGULAR Pads USE AS DIRECTED   Bevespi Aerosphere 9-4.8 MCG/ACT Aero Generic drug: Glycopyrrolate-Formoterol INHALE 2 PUFFS INTO THE LUNGS TWO TIMES DAILY   Bystolic 20 MG Tabs Generic drug: Nebivolol HCl TAKE 1 TABLET EVERY DAY   cetirizine 10 MG tablet Commonly known as: ZYRTEC Take 10 mg by mouth daily.   Cholecalciferol 50 MCG (2000 UT) Tabs Take 1 tablet (2,000 Units total) by mouth daily.   Droplet Pen Needles 31G X 8 MM Misc Generic drug: Insulin Pen Needle USE TO INJECT INSULIN   DULoxetine 60 MG capsule Commonly known as: CYMBALTA TAKE 1 CAPSULE EVERY DAY   Eszopiclone 3 MG Tabs TAKE 1 TABLET BY MOUTH IMMEDIATELY BEFORE BEDTIME   furosemide 20 MG tablet Commonly known as: LASIX TAKE 1 TABLET TWICE DAILY   gabapentin 300 MG capsule Commonly known as: NEURONTIN TAKE 1 CAPSULE THREE TIMES DAILY   Lancets Misc Use to test blood sugar tid. DX: E11.8   linaclotide 72 MCG capsule Commonly known as: Linzess Take 1 capsule (72 mcg total) by mouth daily before breakfast.   Livalo 1 MG Tabs Generic drug: Pitavastatin Calcium Take 1 tablet (1 mg total) by mouth daily.   metFORMIN 500 MG 24 hr  tablet Commonly known as: GLUCOPHAGE-XR TAKE 2 TABLETS BY MOUTH TWICE A DAY   NovoLOG FlexPen 100 UNIT/ML FlexPen Generic drug: insulin aspart ADMINISTER 25 TO 30 UNITS UNDER THE SKIN THREE TIMES DAILY BEFORE  MEALS. MAY TAKE LESS IF SUGAR IS BELOW 120 OR EATING A SMALL MEAL   Odefsey 200-25-25 MG Tabs tablet Generic drug: emtricitabine-rilpivir-tenofovir AF TAKE 1 TABLET BY MOUTH EVERY DAY   olmesartan-hydrochlorothiazide 40-12.5 MG tablet Commonly known as: BENICAR HCT TAKE 1 TABLET EVERY DAY   Ozempic (0.25 or 0.5 MG/DOSE) 2 MG/1.5ML Sopn Generic drug: Semaglutide(0.25 or 0.5MG /DOS) Inject 0.5 mg into the skin once a week. Start with 0.25mg  weekly for 4 weeks   pioglitazone 30 MG tablet Commonly known as: ACTOS TAKE 1 TABLET EVERY DAY   Toujeo Max SoloStar 300 UNIT/ML Solostar Pen Generic drug: insulin glargine (2 Unit Dial) Inject 86 Units into the skin daily.   traZODone 150 MG tablet Commonly known as: DESYREL Take 3 tablets (450 mg total) by mouth at bedtime.   triamcinolone cream 0.5 % Commonly known as: KENALOG APPLY 1 APPLICATION TOPICALLY THREE TIME A DAY       Allergies:  Allergies  Allergen Reactions  . Benazepril Cough  . Pravastatin Other (See Comments)    myositis  . Glipizide Nausea Only    Past Medical History:  Diagnosis Date  . Anxiety   . Cataract    right   . Depression   . Diabetes mellitus without complication ([place])   . DM (diabetes mellitus screen)   . HIV disease ([place]) 09/12/1995  . HIV infection ([place])   . Hypercholesterolemia   . Hyperlipidemia   . Hypertension     Past Surgical History:  Procedure Laterality Date  . COLONOSCOPY WITH PROPOFOL  01/16/2012   Procedure: COLONOSCOPY WITH PROPOFOL;  Surgeon: Garlan Fair, MD;  Location: [facility];  Service: Endoscopy;  Laterality: N/A;  . EYE SURGERY  2005   catarct surgery  . optic lens surgery  2005    Family History  Problem Relation Age of Onset  . Arthritis Mother    . Diabetes Mother   . Heart disease Mother   . Stroke Mother   . Arthritis Father   . Diabetes Father   . Heart disease Father     Social History:  reports that he quit smoking about 2 years ago. His smoking use included cigarettes. He smoked 0.25 packs per day. He has never used smokeless tobacco. He reports that he does not drink alcohol and does not use drugs.  Review of Systems:   Hypertension:  on Bystolic  and on Benicar HCT 1/2, previously on losartan, followed by PCP Also on Lasix as needed  BP Readings from Last 3 Encounters:  07/10/19 (!) 148/76  07/02/19 (!) 150/78  04/30/19 102/60   RENAL function: Now fairly consistently normal  Lab Results  Component Value Date   CREATININE 0.94 07/07/2019   CREATININE 0.88 04/28/2019   CREATININE 0.94 02/19/2019    Lipids: LDL is normal as follows Treatment prescribed by PCP, currently on Livalo   Lab Results  Component Value Date   CHOL 130 04/28/2019   HDL 35.40 (L) 04/28/2019   LDLCALC 75 04/28/2019   TRIG 99.0 04/28/2019   CHOLHDL 4 04/28/2019    Neuropathy: Has had paresthesiae in feet and legs, these are treated with gabapentin 300 mg 3 times a day Today he says that he has a lot of foot pain including on walking He is asking for diabetic shoes, recommend also by podiatrist. Has sensory loss on exam     LABS:  Lab on 07/07/2019  Component Date Value Ref Range Status  . Sodium 07/07/2019 140  135 -  145 mEq/L Final  . Potassium 07/07/2019 4.1  3.5 - 5.1 mEq/L Final  . Chloride 07/07/2019 102  96 - 112 mEq/L Final  . CO2 07/07/2019 30  19 - 32 mEq/L Final  . Glucose, Bld 07/07/2019 108* 70 - 99 mg/dL Final  . BUN 07/07/2019 7  6 - 23 mg/dL Final  . Creatinine, Ser 07/07/2019 0.94  0.40 - 1.50 mg/dL Final  . GFR 07/07/2019 98.50  >60.00 mL/min Final  . Calcium 07/07/2019 9.8  8.4 - 10.5 mg/dL Final  . Hgb A1c MFr Bld 07/07/2019 7.0* 4.6 - 6.5 % Final   Glycemic Control Guidelines for People with  Diabetes:Non Diabetic:  <6%Goal of Therapy: <7%Additional Action Suggested:  >8%      Examination:   BP (!) 148/76 (BP Location: Left Arm, Patient Position: Sitting)   Pulse 72   Ht 6\' 1"  (1.854 m)   Wt 184 lb 3.2 oz (83.6 kg)   SpO2 90% Comment: Patient wearing 2 masks  BMI 24.30 kg/m   Body mass index is 24.3 kg/m.   No ankle edema present  Diabetic Foot Exam - Simple   Simple Foot Form Diabetic Foot exam was performed with the following findings: Yes   Visual Inspection See comments: Yes Sensation Testing See comments: Yes Pulse Check See comments: Yes Comments Atrophy of muscles present, has flat feet Absent monofilament sensation in distal feet Bilaterally pedal pulses not felt      ASSESSMENT/ PLAN:    Diabetes type 2:  See history of present illness for detailed discussion of his current management, blood sugar patterns and problems identified  His A1c is 7% Fructosamine previously 298.  He is on basal bolus insulin, Metformin and Actos  He appears to have better control with a trial of Ozempic although he is not taking this for about a month now He may have had nausea on 0.5 mg weekly but did increase the dose after only 3 injections of the 0.25 mg.  He has lost weight and this is likely to be from his better being able to watch his diet and reduce snacking.the Ozempic. Difficult to assess his actual blood sugar patterns since his meter could not be downloaded Is reporting fairly good blood sugars at home recently with only OCCASIONAL high readings over 200 Not clear if he had any tendency to hypoglycemia with adding Ozempic.    Recommendations: New glucose meter given We will go back to Ozempic 0.25 mg weekly until his next visit Consider increasing the dose only if blood sugars are consistently high  Reduce NovoLog to 25 units if blood sugars are low normal below 100 especially after meals Check readings after meals also regularly Avoid excessive  carbohydrate snacks especially at night To have a protein with every meal   NEUROPATHY: Discussed checking feet regularly every day Podiatrist prescription and form filled out for diabetic shoes   Patient Instructions  Ozempic 0.25mg  weekly  Novolog 25 when sugars < 100  Check blood sugars on waking up 3 days a week  Also check blood sugars about 2 hours after meals and do this after different meals by rotation  Recommended blood sugar levels on waking up are 90-130 and about 2 hours after meal is 130-160  Please bring your blood sugar monitor to each visit, thank you          Elayne Snare 07/10/2019, 12:20 PM           ..

## 2019-07-11 ENCOUNTER — Telehealth: Payer: Self-pay

## 2019-07-11 NOTE — Telephone Encounter (Signed)
FAXED [facility] (Faxed on 07/10/19) Document: Diabetic shoe order form  Other records requested: none at this time.  All above requested information has been faxed successfully to [employer] listed above. Documents and fax confirmation have been placed in the faxed file for future reference.

## 2019-07-14 ENCOUNTER — Other Ambulatory Visit: Payer: Self-pay | Admitting: Internal Medicine

## 2019-07-14 DIAGNOSIS — Z794 Long term (current) use of insulin: Secondary | ICD-10-CM

## 2019-07-14 DIAGNOSIS — I10 Essential (primary) hypertension: Secondary | ICD-10-CM

## 2019-07-30 ENCOUNTER — Other Ambulatory Visit: Payer: Self-pay | Admitting: Internal Medicine

## 2019-07-30 DIAGNOSIS — Z794 Long term (current) use of insulin: Secondary | ICD-10-CM

## 2019-07-30 DIAGNOSIS — J410 Simple chronic bronchitis: Secondary | ICD-10-CM

## 2019-08-06 ENCOUNTER — Other Ambulatory Visit: Payer: Self-pay | Admitting: Internal Medicine

## 2019-08-06 DIAGNOSIS — B20 Human immunodeficiency virus [HIV] disease: Secondary | ICD-10-CM

## 2019-08-26 DIAGNOSIS — H2512 Age-related nuclear cataract, left eye: Secondary | ICD-10-CM | POA: Diagnosis not present

## 2019-08-26 DIAGNOSIS — H35363 Drusen (degenerative) of macula, bilateral: Secondary | ICD-10-CM | POA: Diagnosis not present

## 2019-08-26 DIAGNOSIS — Z794 Long term (current) use of insulin: Secondary | ICD-10-CM | POA: Diagnosis not present

## 2019-08-26 DIAGNOSIS — H40053 Ocular hypertension, bilateral: Secondary | ICD-10-CM | POA: Diagnosis not present

## 2019-08-29 ENCOUNTER — Other Ambulatory Visit: Payer: Self-pay | Admitting: Internal Medicine

## 2019-08-29 DIAGNOSIS — F409 Phobic anxiety disorder, unspecified: Secondary | ICD-10-CM

## 2019-09-03 ENCOUNTER — Other Ambulatory Visit: Payer: Self-pay | Admitting: Internal Medicine

## 2019-09-03 DIAGNOSIS — F419 Anxiety disorder, unspecified: Secondary | ICD-10-CM

## 2019-09-11 ENCOUNTER — Other Ambulatory Visit: Payer: Self-pay

## 2019-09-11 ENCOUNTER — Other Ambulatory Visit (INDEPENDENT_AMBULATORY_CARE_PROVIDER_SITE_OTHER): Payer: Medicare Other

## 2019-09-11 DIAGNOSIS — Z794 Long term (current) use of insulin: Secondary | ICD-10-CM

## 2019-09-11 DIAGNOSIS — E1165 Type 2 diabetes mellitus with hyperglycemia: Secondary | ICD-10-CM

## 2019-09-11 LAB — BASIC METABOLIC PANEL
BUN: 7 mg/dL (ref 6–23)
CO2: 30 mEq/L (ref 19–32)
Calcium: 10 mg/dL (ref 8.4–10.5)
Chloride: 100 mEq/L (ref 96–112)
Creatinine, Ser: 1 mg/dL (ref 0.40–1.50)
GFR: 91.66 mL/min (ref >60.00)
Glucose, Bld: 129 mg/dL — ABNORMAL HIGH (ref 70–99)
Potassium: 3.6 mEq/L (ref 3.5–5.1)
Sodium: 140 mEq/L (ref 135–145)

## 2019-09-12 ENCOUNTER — Telehealth: Payer: Self-pay | Admitting: Internal Medicine

## 2019-09-12 LAB — FRUCTOSAMINE: Fructosamine: 280 umol/L (ref 0–285)

## 2019-09-12 NOTE — Progress Notes (Signed)
°  Chronic Care Management   Outreach Note  09/12/2019 Name: Nicholas Whitehead MRN: [id number] DOB: 12-Aug-1955  Referred by: [name], MD Reason for referral : No chief complaint on file.   An unsuccessful telephone outreach was attempted today. The patient was referred to the pharmacist for assistance with care management and care coordination.   Follow Up Plan:   Earney [place] [facility] Scheduler

## 2019-09-18 ENCOUNTER — Other Ambulatory Visit: Payer: Self-pay

## 2019-09-18 ENCOUNTER — Encounter: Payer: Self-pay | Admitting: Endocrinology

## 2019-09-18 ENCOUNTER — Ambulatory Visit: Payer: Medicare Other | Admitting: Endocrinology

## 2019-09-18 VITALS — BP 140/72 | HR 60 | Ht 73.0 in | Wt 187.2 lb

## 2019-09-18 DIAGNOSIS — Z794 Long term (current) use of insulin: Secondary | ICD-10-CM | POA: Diagnosis not present

## 2019-09-18 DIAGNOSIS — E1165 Type 2 diabetes mellitus with hyperglycemia: Secondary | ICD-10-CM

## 2019-09-18 NOTE — Patient Instructions (Addendum)
Check blood sugars on waking up 3-4 days a week  Also check blood sugars about 2 hours after meals and do this after different meals by rotation  Recommended blood sugar levels on waking up are 90-130 and about 2 hours after meal is 130-160  Please bring your blood sugar monitor to each visit, thank you  Toujeo 90 units daily

## 2019-09-18 NOTE — Progress Notes (Signed)
Patient ID: Nicholas Whitehead, male   DOB: Jul 07, 1955, 64 y.o.   MRN: [id number]           Patient ID: Nicholas Whitehead, male   DOB: [birth year], 64 y.o.   MRN: [id number]   Reason for Appointment: Type II Diabetes follow-up   History of Present Illness   Diagnosis date: 1998    Previous history: He was started on oral agents at diagnosis and around 2006 was put on insulin His blood sugars are usually well controlled when he takes Actos along with his insulin. He had been on Invokana since 04/2013  He requires large doses of insulin especially basal insulin   Recent history:   Insulin regimen: Toujeo 86 units p.m.;  Novolog 30 bid tid         Oral hypoglycemic drugs: Metformin ER 1000 mg twice a day, Actos 30 mg, Ozempic 0.25   A1c is most recently 7%, was 7.6 previously  Previous fructosamine 298  Current management, blood sugar patterns and problems:  He is coming back for 50-month visit  He was restarted on Ozempic 0.25 mg  Has not had any nausea or diarrhea with this now  Not clear if he had GI side effects with the 0.5 dose previously  He said that he is having a poor appetite but this is chronic and has been going on for the last several years  Also because of his depression and difficulty sleeping he is mostly checking his blood sugars during the night and morning hours and had low blood sugars in the afternoons or evenings  He does not think he is eating any formal meals and mostly snacks  Not clear how often he is taking his NovoLog for his current intake as above  Frequently will have some high readings in the 200 range during the night possibly from snacks without insulin  Fasting readings are mostly high also  He says he cannot walk because of foot pain  Weight has leveled off     Side effects from medications:  urgency, frequent urination from Invokana Proper timing of medications in relation to meals: Yes.          Monitors blood glucose:  2-3 + times a day   Glucometer:   Accu-Chek  His monitor was checked for the accuracy of the time and date  Blood sugar readings and averages   PRE-MEAL Fasting Lunch Dinner  overnight  Overall  Glucose range:  132-215  160  196, 200   Mean/median:  168    163   POST-MEAL PC Breakfast PC Lunch PC Dinner  Glucose range:    113  Mean/median:              Meals: Breakfast variable, 9-10 AM Lunch 2-3pm  usually avoiding high-fat meals, dinner at about 6 pm         Physical activity: exercise: doing any formal walking 2/7  Dietician visit: Most recent: 2013    Weight control:  Wt Readings from Last 3 Encounters:  09/18/19 187 lb 3.2 oz (84.9 kg)  07/10/19 184 lb 3.2 oz (83.6 kg)  07/02/19 188 lb (85.3 kg)          Diabetes labs:  Lab Results  Component Value Date   HGBA1C 7.0 (H) 07/07/2019   HGBA1C 7.6 (H) 02/19/2019   HGBA1C 7.0 (H) 09/13/2018   Lab Results  Component Value Date   MICROALBUR 1.0 02/19/2019   LDLCALC 75 04/28/2019   CREATININE 1.00 09/11/2019  Lab Results  Component Value Date   FRUCTOSAMINE 280 09/11/2019   FRUCTOSAMINE 298 (H) 04/28/2019   FRUCTOSAMINE 276 09/13/2018   FRUCTOSAMINE 295 (H) 07/09/2018   FRUCTOSAMINE 309 (H) 02/13/2018    Other active problems discussed today: See review of systems   No visits with results within 1 Week(s) from this visit.  Latest known visit with results is:  Lab on 09/11/2019  Component Date Value Ref Range Status  . Fructosamine 09/11/2019 280  0 - 285 umol/L Final   Comment: Published reference interval for apparently healthy subjects between age 77 and 63 is 83 - 285 umol/L and in a poorly controlled diabetic population is 228 - 563 umol/L with a mean of 396 umol/L.   Marland Kitchen Sodium 09/11/2019 140  135 - 145 mEq/L Final  . Potassium 09/11/2019 3.6  3.5 - 5.1 mEq/L Final  . Chloride 09/11/2019 100  96 - 112 mEq/L Final  . CO2 09/11/2019 30  19 - 32 mEq/L Final  . Glucose, Bld 09/11/2019 129* 70 - 99 mg/dL Final  .  BUN 09/11/2019 7  6 - 23 mg/dL Final  . Creatinine, Ser 09/11/2019 1.00  0.40 - 1.50 mg/dL Final  . GFR 09/11/2019 91.66  >60.00 mL/min Final  . Calcium 09/11/2019 10.0  8.4 - 10.5 mg/dL Final     Allergies as of 09/18/2019      Reactions   Benazepril Cough   Pravastatin Other (See Comments)   myositis   Glipizide Nausea Only      Medication List       Accurate as of September 18, 2019 11:03 AM. If you have any questions, ask your nurse or doctor.        Accu-Chek Guide test strip Generic drug: glucose blood TEST BLOOD SUGAR FOUR TIMES DAILY   albuterol 108 (90 Base) MCG/ACT inhaler Commonly known as: VENTOLIN HFA Inhale 2 puffs into the lungs every 6 (six) hours as needed for wheezing or shortness of breath.   aspirin EC 81 MG tablet Take 81 mg by mouth every morning.   B-D SINGLE USE SWABS REGULAR Pads USE AS DIRECTED   Bevespi Aerosphere 9-4.8 MCG/ACT Aero Generic drug: Glycopyrrolate-Formoterol INHALE 2 PUFFS INTO THE LUNGS TWO TIMES DAILY   Bystolic 20 MG Tabs Generic drug: Nebivolol HCl TAKE 1 TABLET EVERY DAY   cetirizine 10 MG tablet Commonly known as: ZYRTEC Take 10 mg by mouth daily.   Cholecalciferol 50 MCG (2000 UT) Tabs Take 1 tablet (2,000 Units total) by mouth daily.   Droplet Pen Needles 31G X 8 MM Misc Generic drug: Insulin Pen Needle USE TO INJECT INSULIN   DULoxetine 60 MG capsule Commonly known as: CYMBALTA TAKE 1 CAPSULE EVERY DAY   Eszopiclone 3 MG Tabs TAKE 1 TABLET BY MOUTH IMMEDIATELY BEFORE BEDTIME   furosemide 20 MG tablet Commonly known as: LASIX TAKE 1 TABLET TWICE DAILY   gabapentin 300 MG capsule Commonly known as: NEURONTIN TAKE 1 CAPSULE THREE TIMES DAILY   Lancets Misc Use to test blood sugar tid. DX: E11.8   linaclotide 72 MCG capsule Commonly known as: Linzess Take 1 capsule (72 mcg total) by mouth daily before breakfast.   Livalo 1 MG Tabs Generic drug: Pitavastatin Calcium Take 1 tablet (1 mg total) by  mouth daily.   metFORMIN 500 MG 24 hr tablet Commonly known as: GLUCOPHAGE-XR TAKE 2 TABLETS TWICE DAILY   NovoLOG FlexPen 100 UNIT/ML FlexPen Generic drug: insulin aspart ADMINISTER 25 TO 30 UNITS UNDER THE SKIN  THREE TIMES DAILY BEFORE MEALS. MAY TAKE LESS IF SUGAR IS BELOW 120 OR EATING A SMALL MEAL   Odefsey 200-25-25 MG Tabs tablet Generic drug: emtricitabine-rilpivir-tenofovir AF TAKE 1 TABLET BY MOUTH EVERY DAY   olmesartan-hydrochlorothiazide 40-12.5 MG tablet Commonly known as: BENICAR HCT TAKE 1 TABLET EVERY DAY   Ozempic (0.25 or 0.5 MG/DOSE) 2 MG/1.5ML Sopn Generic drug: Semaglutide(0.25 or 0.5MG /DOS) Inject 0.5 mg into the skin once a week. Start with 0.25mg  weekly for 4 weeks   pioglitazone 30 MG tablet Commonly known as: ACTOS TAKE 1 TABLET EVERY DAY   Toujeo Max SoloStar 300 UNIT/ML Solostar Pen Generic drug: insulin glargine (2 Unit Dial) Inject 86 Units into the skin daily.   traZODone 150 MG tablet Commonly known as: DESYREL Take 3 tablets (450 mg total) by mouth at bedtime.   trazodone 300 MG tablet Commonly known as: DESYREL TAKE 1 TABLET AT BEDTIME   triamcinolone cream 0.5 % Commonly known as: KENALOG APPLY 1 APPLICATION TOPICALLY THREE TIME A DAY       Allergies:  Allergies  Allergen Reactions  . Benazepril Cough  . Pravastatin Other (See Comments)    myositis  . Glipizide Nausea Only    Past Medical History:  Diagnosis Date  . Anxiety   . Cataract    right   . Depression   . Diabetes mellitus without complication ([place])   . DM (diabetes mellitus screen)   . HIV disease ([place]) 09/12/1995  . HIV infection ([place])   . Hypercholesterolemia   . Hyperlipidemia   . Hypertension     Past Surgical History:  Procedure Laterality Date  . COLONOSCOPY WITH PROPOFOL  01/16/2012   Procedure: COLONOSCOPY WITH PROPOFOL;  Surgeon: Garlan Fair, MD;  Location: [facility];  Service: Endoscopy;  Laterality: N/A;  . EYE SURGERY  2005    catarct surgery  . optic lens surgery  2005    Family History  Problem Relation Age of Onset  . Arthritis Mother   . Diabetes Mother   . Heart disease Mother   . Stroke Mother   . Arthritis Father   . Diabetes Father   . Heart disease Father     Social History:  reports that he quit smoking about 2 years ago. His smoking use included cigarettes. He smoked 0.25 packs per day. He has never used smokeless tobacco. He reports that he does not drink alcohol and does not use drugs.  Review of Systems:  Hypertension:  on Bystolic  and on Benicar HCT 1/2, previously on losartan, followed by PCP Also on Lasix as needed  BP Readings from Last 3 Encounters:  09/18/19 140/72  07/10/19 (!) 148/76  07/02/19 (!) 150/78   RENAL function: Now fairly consistently normal  Lab Results  Component Value Date   CREATININE 1.00 09/11/2019   CREATININE 0.94 07/07/2019   CREATININE 0.88 04/28/2019    Lipids: LDL is normal as follows Treatment prescribed by PCP, currently on Livalo   Lab Results  Component Value Date   CHOL 130 04/28/2019   HDL 35.40 (L) 04/28/2019   LDLCALC 75 04/28/2019   TRIG 99.0 04/28/2019   CHOLHDL 4 04/28/2019    Neuropathy: Has had paresthesiae in feet and legs, these are treated with gabapentin 300 mg 3 times a day Today he says that he has a lot of foot pain including on walking  Forms for the diabetic shoes were sent in July to the podiatrist. Has sensory loss on exam  LABS:  No visits with results within 1 Week(s) from this visit.  Latest known visit with results is:  Lab on 09/11/2019  Component Date Value Ref Range Status  . Fructosamine 09/11/2019 280  0 - 285 umol/L Final   Comment: Published reference interval for apparently healthy subjects between age 41 and 7 is 77 - 285 umol/L and in a poorly controlled diabetic population is 228 - 563 umol/L with a mean of 396 umol/L.   Marland Kitchen Sodium 09/11/2019 140  135 - 145 mEq/L Final  . Potassium  09/11/2019 3.6  3.5 - 5.1 mEq/L Final  . Chloride 09/11/2019 100  96 - 112 mEq/L Final  . CO2 09/11/2019 30  19 - 32 mEq/L Final  . Glucose, Bld 09/11/2019 129* 70 - 99 mg/dL Final  . BUN 09/11/2019 7  6 - 23 mg/dL Final  . Creatinine, Ser 09/11/2019 1.00  0.40 - 1.50 mg/dL Final  . GFR 09/11/2019 91.66  >60.00 mL/min Final  . Calcium 09/11/2019 10.0  8.4 - 10.5 mg/dL Final     Examination:   BP 140/72 (BP Location: Left Arm, Patient Position: Sitting, Cuff Size: Normal)   Pulse 60   Ht 6\' 1"  (1.854 m)   Wt 187 lb 3.2 oz (84.9 kg)   SpO2 95%   BMI 24.70 kg/m   Body mass index is 24.7 kg/m.     ASSESSMENT/ PLAN:    Diabetes type 2:  See history of present illness for detailed discussion of his current management, blood sugar patterns and problems identified  His A1c is last 7% Fructosamine previously 298, now 280.  He is on basal bolus insulin, Metformin and Actos  Although he does not have a recent A1c and is blood sugars are higher than expected for his A1c is fructosamine indicates relatively better controlled with restarting Ozempic 0.25 More recently has not lost any weight and is not having any nausea with Ozempic He has had chronic anorexia Not clear if this is related to his Metformin which he has taken probably since his diagnosis  He has not monitored his blood sugar regularly and mostly early morning and midday Occasionally checks readings at night and these may be high from not eating bland meals and taking insulin with any snacks   Recommendations: Check blood sugars regularly at various times of the day Try to have planned meals and take the NovoLog 25 minutes before each meal Increase Toujeo to 90 units instead of 86  Follow-up with PCP regarding decreased appetite   There are no Patient Instructions on file for this visit.       Elayne Snare 09/18/2019, 11:03 AM           ..

## 2019-09-19 ENCOUNTER — Telehealth: Payer: Self-pay | Admitting: Internal Medicine

## 2019-09-19 DIAGNOSIS — I10 Essential (primary) hypertension: Secondary | ICD-10-CM

## 2019-09-19 NOTE — Progress Notes (Signed)
  Chronic Care Management   Note  09/19/2019 Name: Nicholas Whitehead MRN: [id number] DOB: 04/27/1955  [name] is a 64 y.o. year old male who is a primary care patient of [name], MD. I reached out to Debbe Bales by phone today in response to a referral sent by Nicholas Whitehead's PCP, [name], MD.   [name] was given information about Chronic Care Management services today including:  1. CCM service includes personalized support from designated clinical staff supervised by his physician, including individualized plan of care and coordination with other care providers 2. 24/7 contact phone numbers for assistance for urgent and routine care needs. 3. Service will only be billed when office clinical staff spend 20 minutes or more in a month to coordinate care. 4. Only one practitioner may furnish and bill the service in a calendar month. 5. The patient may stop CCM services at any time (effective at the end of the month) by phone call to the office staff.   Patient agreed to services and verbal consent obtained.   Follow up plan:   Carley Perdue [employer] Scheduler

## 2019-09-22 ENCOUNTER — Encounter: Payer: Self-pay | Admitting: Internal Medicine

## 2019-09-22 ENCOUNTER — Other Ambulatory Visit: Payer: Self-pay

## 2019-09-22 ENCOUNTER — Ambulatory Visit (INDEPENDENT_AMBULATORY_CARE_PROVIDER_SITE_OTHER): Payer: Medicare Other | Admitting: Internal Medicine

## 2019-09-22 VITALS — BP 118/66 | HR 73 | Temp 98.4°F | Resp 16 | Ht 73.0 in | Wt 189.0 lb

## 2019-09-22 DIAGNOSIS — Z794 Long term (current) use of insulin: Secondary | ICD-10-CM

## 2019-09-22 DIAGNOSIS — I10 Essential (primary) hypertension: Secondary | ICD-10-CM

## 2019-09-22 DIAGNOSIS — E1140 Type 2 diabetes mellitus with diabetic neuropathy, unspecified: Secondary | ICD-10-CM

## 2019-09-22 MED ORDER — OLMESARTAN MEDOXOMIL 40 MG PO TABS: 40.0000 mg | ORAL_TABLET | Freq: Every day | ORAL | 1 refills | Status: DC

## 2019-09-22 MED ORDER — GABAPENTIN 300 MG PO CAPS: ORAL_CAPSULE | ORAL | 1 refills | Status: DC

## 2019-09-22 NOTE — Progress Notes (Signed)
Subjective:  Patient ID: Nicholas Whitehead, male    DOB: Feb 22, 1955  Age: 64 y.o. MRN: [id number]  CC: Hypertension and Diabetes  This visit occurred during the SARS-CoV-2 public health emergency.  Safety protocols were in place, including screening questions prior to the visit, additional usage of staff PPE, and extensive cleaning of exam room while observing appropriate contact time as indicated for disinfecting solutions.    HPI Nicholas Whitehead presents for f/up - Nicholas Whitehead ran out of gabapentin about 3 months ago and complains of worsening stabbing/stingning pain in his feet. Nicholas Whitehead is active and denies CP, DOE, palpitations, edema, or fatigue.  Outpatient Medications Prior to Visit  Medication Sig Dispense Refill   albuterol (PROVENTIL HFA;VENTOLIN HFA) 108 (90 Base) MCG/ACT inhaler Inhale 2 puffs into the lungs every 6 (six) hours as needed for wheezing or shortness of breath. 1 Inhaler 5   Alcohol Swabs (B-D SINGLE USE SWABS REGULAR) PADS USE AS DIRECTED 300 each 1   aspirin EC 81 MG tablet Take 81 mg by mouth every morning.     BEVESPI AEROSPHERE 9-4.8 MCG/ACT AERO INHALE 2 PUFFS INTO THE LUNGS TWO TIMES DAILY 66.4 g 1   BYSTOLIC 20 MG TABS TAKE 1 TABLET EVERY DAY 90 tablet 1   cetirizine (ZYRTEC) 10 MG tablet Take 10 mg by mouth daily.     Cholecalciferol 2000 units TABS Take 1 tablet (2,000 Units total) by mouth daily. 90 tablet 1   DROPLET PEN NEEDLES 31G X 8 MM MISC USE TO INJECT INSULIN 500 each 1   DULoxetine (CYMBALTA) 60 MG capsule TAKE 1 CAPSULE EVERY DAY 90 capsule 1   Eszopiclone 3 MG TABS TAKE 1 TABLET BY MOUTH IMMEDIATELY BEFORE BEDTIME 90 tablet 0   furosemide (LASIX) 20 MG tablet TAKE 1 TABLET TWICE DAILY 180 tablet 1   glucose blood (ACCU-CHEK GUIDE) test strip TEST BLOOD SUGAR FOUR TIMES DAILY 400 strip 3   insulin glargine, 2 Unit Dial, (TOUJEO MAX SOLOSTAR) 300 UNIT/ML Solostar Pen Inject 86 Units into the skin daily. 6.45 mL 2   Lancets MISC Use to test blood  sugar tid. DX: E11.8 300 each 3   linaclotide (LINZESS) 72 MCG capsule Take 1 capsule (72 mcg total) by mouth daily before breakfast. 90 capsule 1   metFORMIN (GLUCOPHAGE-XR) 500 MG 24 hr tablet TAKE 2 TABLETS TWICE DAILY 360 tablet 1   NOVOLOG FLEXPEN 100 UNIT/ML FlexPen ADMINISTER 25 TO 30 UNITS UNDER THE SKIN THREE TIMES DAILY BEFORE MEALS. MAY TAKE LESS IF SUGAR IS BELOW 120 OR EATING A SMALL MEAL 30 mL 1   ODEFSEY 200-25-25 MG TABS tablet TAKE 1 TABLET BY MOUTH EVERY DAY 30 tablet 4   pioglitazone (ACTOS) 30 MG tablet TAKE 1 TABLET EVERY DAY 90 tablet 1   Pitavastatin Calcium (LIVALO) 1 MG TABS Take 1 tablet (1 mg total) by mouth daily. 90 tablet 1   Semaglutide,0.25 or 0.5MG /DOS, (OZEMPIC, 0.25 OR 0.5 MG/DOSE,) 2 MG/1.5ML SOPN Inject 0.5 mg into the skin once a week. Start with 0.25mg  weekly for 4 weeks 1 pen 2   traZODone (DESYREL) 150 MG tablet Take 3 tablets (450 mg total) by mouth at bedtime. 270 tablet 1   trazodone (DESYREL) 300 MG tablet TAKE 1 TABLET AT BEDTIME 90 tablet 1   triamcinolone cream (KENALOG) 0.5 % APPLY 1 APPLICATION TOPICALLY THREE TIME A DAY 100 g 3   gabapentin (NEURONTIN) 300 MG capsule TAKE 1 CAPSULE THREE TIMES DAILY 270 capsule 1   olmesartan-hydrochlorothiazide (BENICAR  HCT) 40-12.5 MG tablet TAKE 1 TABLET EVERY DAY 90 tablet 1   No facility-administered medications prior to visit.    ROS Review of Systems  Constitutional: Negative for appetite change, diaphoresis, fatigue and unexpected weight change.  HENT: Negative.   Eyes: Negative for visual disturbance.  Respiratory: Negative for cough, chest tightness, shortness of breath and wheezing.   Cardiovascular: Negative for chest pain, palpitations and leg swelling.  Gastrointestinal: Negative for abdominal pain, constipation, diarrhea, nausea and vomiting.  Endocrine: Negative.   Genitourinary: Negative.  Negative for difficulty urinating.  Musculoskeletal: Negative for arthralgias and  myalgias.  Skin: Negative.  Negative for color change and pallor.  Neurological: Negative.  Negative for dizziness, weakness, light-headedness and numbness.  Hematological: Negative for adenopathy. Does not bruise/bleed easily.  Psychiatric/Behavioral: Negative.     Objective:  BP 118/66    Pulse 73    Temp 98.4 F (36.9 C) (Oral)    Resp 16    Ht 6\' 1"  (1.854 m)    Wt 189 lb (85.7 kg)    SpO2 91%    BMI 24.94 kg/m   BP Readings from Last 3 Encounters:  09/22/19 118/66  09/18/19 140/72  07/10/19 (!) 148/76    Wt Readings from Last 3 Encounters:  09/22/19 189 lb (85.7 kg)  09/18/19 187 lb 3.2 oz (84.9 kg)  07/10/19 184 lb 3.2 oz (83.6 kg)    Physical Exam Vitals reviewed.  Constitutional:      Appearance: Normal appearance.  HENT:     Nose: Nose normal.     Mouth/Throat:     Mouth: Mucous membranes are moist.  Eyes:     General: No scleral icterus.    Conjunctiva/sclera: Conjunctivae normal.  Cardiovascular:     Rate and Rhythm: Normal rate and regular rhythm.     Heart sounds: No murmur heard.   Pulmonary:     Effort: Pulmonary effort is normal.     Breath sounds: No stridor. No wheezing, rhonchi or rales.  Abdominal:     General: Abdomen is flat. Bowel sounds are normal. There is no distension.     Palpations: Abdomen is soft. There is no hepatomegaly, splenomegaly or mass.     Tenderness: There is no abdominal tenderness.  Musculoskeletal:        General: Normal range of motion.     Cervical back: Neck supple.     Right lower leg: No edema.     Left lower leg: No edema.  Lymphadenopathy:     Cervical: No cervical adenopathy.  Skin:    General: Skin is warm and dry.     Coloration: Skin is not pale.  Neurological:     General: No focal deficit present.     Mental Status: Nicholas Whitehead is alert.  Psychiatric:        Mood and Affect: Mood normal.        Behavior: Behavior normal.     Lab Results  Component Value Date   WBC 7.3 03/25/2019   HGB 15.6 03/25/2019     HCT 45.5 03/25/2019   PLT 189.0 03/25/2019   GLUCOSE 129 (H) 09/11/2019   CHOL 130 04/28/2019   TRIG 99.0 04/28/2019   HDL 35.40 (L) 04/28/2019   LDLCALC 75 04/28/2019   ALT 13 03/18/2018   AST 15 03/18/2018   NA 140 09/11/2019   K 3.6 09/11/2019   CL 100 09/11/2019   CREATININE 1.00 09/11/2019   BUN 7 09/11/2019   CO2 30 09/11/2019  TSH 1.21 03/20/2019   PSA 1.03 03/20/2019   HGBA1C 7.0 (H) 07/07/2019   MICROALBUR 1.0 02/19/2019    No results found.  Assessment & Plan:   Nicholas Whitehead was seen today for hypertension and diabetes.  Diagnoses and all orders for this visit:  Essential hypertension- His SBP is down to 118. Will discontinue HCTZ and continue nebivolol, the ARB, and the loop diuretic. -     olmesartan (BENICAR) 40 MG tablet; Take 1 tablet (40 mg total) by mouth daily.  Type 2 diabetes mellitus with diabetic neuropathy, with long-term current use of insulin ([place])- His blood sugars have been well controlled. -     olmesartan (BENICAR) 40 MG tablet; Take 1 tablet (40 mg total) by mouth daily.  Diabetic neuropathy, painful ([facility]) -     gabapentin (NEURONTIN) 300 MG capsule; TAKE 1 CAPSULE THREE TIMES DAILY  Other orders -     Cancel: Flu Vaccine QUAD 6+ mos PF IM (Fluarix Quad PF)   I have discontinued Leane Para Shavers's olmesartan-hydrochlorothiazide. I am also having him start on olmesartan. Additionally, I am having him maintain his cetirizine, aspirin EC, albuterol, Cholecalciferol, Lancets, linaclotide, Droplet Pen Needles, Accu-Chek Guide, NovoLOG FlexPen, Toujeo Max SoloStar, Livalo, traZODone, Ozempic (0.25 or 0.5 MG/DOSE), furosemide, triamcinolone cream, metFORMIN, Bystolic, trazodone, pioglitazone, Bevespi Aerosphere, Odefsey, Eszopiclone, DULoxetine, B-D SINGLE USE SWABS REGULAR, and gabapentin.  Meds ordered this encounter  Medications   gabapentin (NEURONTIN) 300 MG capsule    Sig: TAKE 1 CAPSULE THREE TIMES DAILY    Dispense:  270 capsule    Refill:  1    olmesartan (BENICAR) 40 MG tablet    Sig: Take 1 tablet (40 mg total) by mouth daily.    Dispense:  90 tablet    Refill:  1     Follow-up: Return in about 4 months (around 01/22/2020).  Scarlette Calico, MD

## 2019-09-22 NOTE — Patient Instructions (Signed)

## 2019-09-23 ENCOUNTER — Encounter: Payer: Self-pay | Admitting: Podiatry

## 2019-09-23 ENCOUNTER — Ambulatory Visit (INDEPENDENT_AMBULATORY_CARE_PROVIDER_SITE_OTHER): Payer: Medicare Other | Admitting: Podiatry

## 2019-09-23 DIAGNOSIS — M79675 Pain in left toe(s): Secondary | ICD-10-CM | POA: Diagnosis not present

## 2019-09-23 DIAGNOSIS — F17200 Nicotine dependence, unspecified, uncomplicated: Secondary | ICD-10-CM

## 2019-09-23 DIAGNOSIS — L84 Corns and callosities: Secondary | ICD-10-CM

## 2019-09-23 DIAGNOSIS — B351 Tinea unguium: Secondary | ICD-10-CM

## 2019-09-23 DIAGNOSIS — E1140 Type 2 diabetes mellitus with diabetic neuropathy, unspecified: Secondary | ICD-10-CM | POA: Diagnosis not present

## 2019-09-23 DIAGNOSIS — E785 Hyperlipidemia, unspecified: Secondary | ICD-10-CM | POA: Diagnosis not present

## 2019-09-23 DIAGNOSIS — Z794 Long term (current) use of insulin: Secondary | ICD-10-CM | POA: Diagnosis not present

## 2019-09-23 DIAGNOSIS — B079 Viral wart, unspecified: Secondary | ICD-10-CM

## 2019-09-23 DIAGNOSIS — M79674 Pain in right toe(s): Secondary | ICD-10-CM

## 2019-09-23 NOTE — Patient Instructions (Signed)
Recommend Dermatology Consultation regarding lesions on plantar aspect of left foot:  [facility] at [phone] [address] [place], [place] [address]  Recommend Vascular Consult to check your circulation. I will order this test and they will call you for an appointment.  Dawn will call you when your diabetic inserts arrive.

## 2019-09-24 ENCOUNTER — Encounter: Payer: Self-pay | Admitting: Podiatry

## 2019-09-24 NOTE — Progress Notes (Signed)
Subjective: Nicholas Whitehead presents today at risk foot care. Pt has h/o NIDDM with chronic kidney disease and painful callus(es) b/l heels and painful mycotic toenails b/l that are difficult to trim. Pain interferes with ambulation. Aggravating factors include wearing enclosed shoe gear. Pain is relieved with periodic professional debridement.   He also has subjective symptoms of neuropathy and continues to take gabapentin for his neuropathic symptoms. He voices no new pedal concerns on today's visit.  [name], MD is patient's PCP. Last visit was: 09/22/2019.He is followed by Endocrinologist, Kateri Plummer for his diabetes and last visit was 09/18/2019.  Past Medical History:  Diagnosis Date  . Anxiety   . Cataract    right   . Depression   . Diabetes mellitus without complication ([place])   . DM (diabetes mellitus screen)   . HIV disease ([place]) 09/12/1995  . HIV infection ([place])   . Hypercholesterolemia   . Hyperlipidemia   . Hypertension      Current Outpatient Medications on File Prior to Visit  Medication Sig Dispense Refill  . albuterol (PROVENTIL HFA;VENTOLIN HFA) 108 (90 Base) MCG/ACT inhaler Inhale 2 puffs into the lungs every 6 (six) hours as needed for wheezing or shortness of breath. 1 Inhaler 5  . Alcohol Swabs (B-D SINGLE USE SWABS REGULAR) PADS USE AS DIRECTED 300 each 1  . aspirin EC 81 MG tablet Take 81 mg by mouth every morning.    Marland Kitchen BEVESPI AEROSPHERE 9-4.8 MCG/ACT AERO INHALE 2 PUFFS INTO THE LUNGS TWO TIMES DAILY 32.1 g 1  . BYSTOLIC 20 MG TABS TAKE 1 TABLET EVERY DAY 90 tablet 1  . cetirizine (ZYRTEC) 10 MG tablet Take 10 mg by mouth daily.    . Cholecalciferol 2000 units TABS Take 1 tablet (2,000 Units total) by mouth daily. 90 tablet 1  . DROPLET PEN NEEDLES 31G X 8 MM MISC USE TO INJECT INSULIN 500 each 1  . DULoxetine (CYMBALTA) 60 MG capsule TAKE 1 CAPSULE EVERY DAY 90 capsule 1  . Eszopiclone 3 MG TABS TAKE 1 TABLET BY MOUTH IMMEDIATELY BEFORE BEDTIME 90  tablet 0  . furosemide (LASIX) 20 MG tablet TAKE 1 TABLET TWICE DAILY 180 tablet 1  . gabapentin (NEURONTIN) 300 MG capsule TAKE 1 CAPSULE THREE TIMES DAILY 270 capsule 1  . glucose blood (ACCU-CHEK GUIDE) test strip TEST BLOOD SUGAR FOUR TIMES DAILY 400 strip 3  . insulin glargine, 2 Unit Dial, (TOUJEO MAX SOLOSTAR) 300 UNIT/ML Solostar Pen Inject 86 Units into the skin daily. 6.45 mL 2  . Lancets MISC Use to test blood sugar tid. DX: E11.8 300 each 3  . linaclotide (LINZESS) 72 MCG capsule Take 1 capsule (72 mcg total) by mouth daily before breakfast. 90 capsule 1  . metFORMIN (GLUCOPHAGE-XR) 500 MG 24 hr tablet TAKE 2 TABLETS TWICE DAILY 360 tablet 1  . NOVOLOG FLEXPEN 100 UNIT/ML FlexPen ADMINISTER 25 TO 30 UNITS UNDER THE SKIN THREE TIMES DAILY BEFORE MEALS. MAY TAKE LESS IF SUGAR IS BELOW 120 OR EATING A SMALL MEAL 30 mL 1  . ODEFSEY 200-25-25 MG TABS tablet TAKE 1 TABLET BY MOUTH EVERY DAY 30 tablet 4  . olmesartan (BENICAR) 40 MG tablet Take 1 tablet (40 mg total) by mouth daily. 90 tablet 1  . pioglitazone (ACTOS) 30 MG tablet TAKE 1 TABLET EVERY DAY 90 tablet 1  . Pitavastatin Calcium (LIVALO) 1 MG TABS Take 1 tablet (1 mg total) by mouth daily. 90 tablet 1  . Semaglutide,0.25 or 0.5MG /DOS, ([place],  0.25 OR 0.5 MG/DOSE,) 2 MG/1.5ML SOPN Inject 0.5 mg into the skin once a week. Start with 0.25mg  weekly for 4 weeks 1 pen 2  . traZODone (DESYREL) 150 MG tablet Take 3 tablets (450 mg total) by mouth at bedtime. 270 tablet 1  . trazodone (DESYREL) 300 MG tablet TAKE 1 TABLET AT BEDTIME 90 tablet 1  . triamcinolone cream (KENALOG) 0.5 % APPLY 1 APPLICATION TOPICALLY THREE TIME A DAY 100 g 3  . [DISCONTINUED] Alcohol Swabs (B-D SINGLE USE SWABS REGULAR) PADS USE AS DIRECTED 300 each 1  . [DISCONTINUED] Alcohol Swabs (B-D SINGLE USE SWABS REGULAR) PADS USE AS DIRECTED 300 each 1  . [DISCONTINUED] DULoxetine (CYMBALTA) 60 MG capsule TAKE 1 CAPSULE EVERY DAY 90 capsule 1  . [DISCONTINUED]  DULoxetine (CYMBALTA) 60 MG capsule TAKE 1 CAPSULE EVERY DAY 90 capsule 1  . [DISCONTINUED] furosemide (LASIX) 20 MG tablet TAKE 1 TABLET TWICE DAILY 180 tablet 1  . [DISCONTINUED] metFORMIN (GLUCOPHAGE-XR) 500 MG 24 hr tablet TAKE 2 TABLETS BY MOUTH TWICE DAILY 360 tablet 1  . [DISCONTINUED] Nebivolol HCl (BYSTOLIC) 20 MG TABS Take 1 tablet (20 mg total) by mouth daily. 90 tablet 1  . [DISCONTINUED] olmesartan-hydrochlorothiazide (BENICAR HCT) 40-12.5 MG tablet TAKE 1 TABLET EVERY DAY 90 tablet 1  . [DISCONTINUED] pioglitazone (ACTOS) 30 MG tablet Take 1 tablet (30 mg total) by mouth daily. 90 tablet 1   No current facility-administered medications on file prior to visit.     Allergies  Allergen Reactions  . Benazepril Cough  . Pravastatin Other (See Comments)    myositis  . Glipizide Nausea Only    Objective: Nicholas Whitehead is a pleasant 64 y.o.  African American male, WD, WN in NAD. AAO x 3.  There were no vitals filed for this visit.  Vascular Examination: Capillary refill time to digits immediate b/l. Faintly palpable DP pulses b/l. Nonpalpable PT pulse(s) b/l lower extremities. Pedal hair absent b/l. Skin temperature gradient warm to cool b/l.  Dermatological Examination: Pedal skin with normal turgor, texture and tone bilaterally. No open wounds bilaterally. No interdigital macerations bilaterally. Toenails 1-5 b/l elongated, discolored, dystrophic, thickened, crumbly with subungual debris and tenderness to dorsal palpation. Hyperkeratotic lesion(s) plantar aspect b/l heel pads.  No erythema, no edema, no drainage, no flocculence.    Small cluster of verrucous-type flat lesions noted plantar aspect medial arch area. No surrounding erythema, no edema, no drainage, non-fluctuant.  Musculoskeletal: Normal muscle strength 5/5 to all lower extremity muscle groups bilaterally. No pain crepitus or joint limitation noted with ROM b/l. Hammertoes noted to the 2-5 bilaterally. Pes planus  deformity noted b/l.   Neurological Examination: Pt has subjective symptoms of neuropathy. Protective sensation decreased with 10 gram monofilament b/l. Vibratory sensation intact b/l. Proprioception intact bilaterally.  Assessment: 1. Pain due to onychomycosis of toenails of both feet   2. Callus   3. Verrucous lesion of skin   4. Current every day smoker   5. HIV disease ([place]   6. Type 2 diabetes mellitus with diabetic neuropathy, with long-term current use of insulin ([facility])    Plan: -Examined patient. -Order new ABIs with segmental pressures with diagnoses: NIDDM, current smoker, CKD stage 3, numbness in toes, cramping, hyperlipidemia, neuropathy. -Recommend Dermatology Consult. Referred to [facility], Dr. Danella Sensing, [address] [place], [place], for evaluation and treatment of lesion on plantar aspect left foot. -Toenails 1-5 b/l were debrided in length and girth with sterile nail nippers and dremel without iatrogenic bleeding.  -  Callus(es) plantar aspect b/l heel pads pared utilizing sterile scalpel blade without complication or incident. Total number debrided =2. -Patient to continue soft, supportive shoe gear daily. -Patient to report any pedal injuries to medical professional immediately. -Patient/POA to call should there be question/concern in the interim.  Return in about 3 months (around 12/23/2019).  Marzetta Board, DPM

## 2019-09-30 ENCOUNTER — Ambulatory Visit: Payer: Medicare Other | Attending: Internal Medicine

## 2019-09-30 DIAGNOSIS — Z23 Encounter for immunization: Secondary | ICD-10-CM

## 2019-09-30 NOTE — Progress Notes (Signed)
   Covid-19 Vaccination Clinic  Name:  Abie Cheek    MRN: [id number] DOB: 03/06/1955  09/30/2019  Mr. Kopf was observed post Covid-19 immunization for 15 minutes without incident. He was provided with Vaccine Information Sheet and instruction to access the V-Safe system.   Mr. Anello was instructed to call 911 with any severe reactions post vaccine: Marland Kitchen Difficulty breathing  . Swelling of face and throat  . A fast heartbeat  . A bad rash all over body  . Dizziness and weakness   Immunizations Administered    Name Date Dose VIS Date Route   Pfizer COVID-19 Vaccine 09/30/2019 10:19 AM 0.3 mL 03/05/2018 Intramuscular   Manufacturer: [place]   Lot: [id number]   [place]: [id number]

## 2019-10-09 ENCOUNTER — Telehealth: Payer: Self-pay | Admitting: Podiatry

## 2019-10-09 NOTE — Telephone Encounter (Signed)
Pt left message today @ 127pm confirming his appt for tomorrow @ 11

## 2019-10-10 ENCOUNTER — Other Ambulatory Visit: Payer: Self-pay

## 2019-10-10 ENCOUNTER — Ambulatory Visit (INDEPENDENT_AMBULATORY_CARE_PROVIDER_SITE_OTHER): Payer: Medicare Other | Admitting: Orthotics

## 2019-10-10 DIAGNOSIS — L84 Corns and callosities: Secondary | ICD-10-CM

## 2019-10-10 DIAGNOSIS — M2141 Flat foot [pes planus] (acquired), right foot: Secondary | ICD-10-CM | POA: Diagnosis not present

## 2019-10-10 DIAGNOSIS — E1140 Type 2 diabetes mellitus with diabetic neuropathy, unspecified: Secondary | ICD-10-CM | POA: Diagnosis not present

## 2019-10-10 DIAGNOSIS — M2041 Other hammer toe(s) (acquired), right foot: Secondary | ICD-10-CM | POA: Diagnosis not present

## 2019-10-10 DIAGNOSIS — M2142 Flat foot [pes planus] (acquired), left foot: Secondary | ICD-10-CM | POA: Diagnosis not present

## 2019-10-10 DIAGNOSIS — B351 Tinea unguium: Secondary | ICD-10-CM

## 2019-10-10 NOTE — Progress Notes (Signed)
Patient came in today to pick up custom made foot orthotics.  The goals were accomplished and the patient reported no dissatisfaction with said orthotics.  Patient was advised of breakin period and how to report any issues. 

## 2019-10-15 ENCOUNTER — Other Ambulatory Visit (HOSPITAL_COMMUNITY): Payer: Self-pay | Admitting: Podiatry

## 2019-10-15 DIAGNOSIS — I739 Peripheral vascular disease, unspecified: Secondary | ICD-10-CM

## 2019-10-16 ENCOUNTER — Ambulatory Visit (HOSPITAL_COMMUNITY)
Admission: RE | Admit: 2019-10-16 | Discharge: 2019-10-16 | Disposition: A | Discharge status: Home / Self Care | Payer: Medicare Other | Source: Ambulatory Visit | Attending: Cardiology | Admitting: Cardiology

## 2019-10-16 ENCOUNTER — Other Ambulatory Visit: Payer: Self-pay

## 2019-10-16 DIAGNOSIS — F17200 Nicotine dependence, unspecified, uncomplicated: Secondary | ICD-10-CM | POA: Diagnosis not present

## 2019-10-16 DIAGNOSIS — E1151 Type 2 diabetes mellitus with diabetic peripheral angiopathy without gangrene: Secondary | ICD-10-CM

## 2019-10-16 DIAGNOSIS — I739 Peripheral vascular disease, unspecified: Secondary | ICD-10-CM | POA: Diagnosis not present

## 2019-10-16 DIAGNOSIS — Z794 Long term (current) use of insulin: Secondary | ICD-10-CM | POA: Insufficient documentation

## 2019-10-16 DIAGNOSIS — E785 Hyperlipidemia, unspecified: Secondary | ICD-10-CM | POA: Insufficient documentation

## 2019-10-16 DIAGNOSIS — E1140 Type 2 diabetes mellitus with diabetic neuropathy, unspecified: Secondary | ICD-10-CM | POA: Diagnosis not present

## 2019-10-20 NOTE — Addendum Note (Signed)
Addended by: Hinda Kehr on: 10/20/2019 10:44 AM   Modules accepted: Orders

## 2019-10-21 ENCOUNTER — Ambulatory Visit: Payer: Medicare Other | Attending: Critical Care Medicine

## 2019-10-21 ENCOUNTER — Telehealth: Payer: Medicare Other

## 2019-10-21 ENCOUNTER — Other Ambulatory Visit: Payer: Self-pay

## 2019-10-21 ENCOUNTER — Ambulatory Visit: Payer: Medicare Other | Admitting: Pharmacist

## 2019-10-21 DIAGNOSIS — E1140 Type 2 diabetes mellitus with diabetic neuropathy, unspecified: Secondary | ICD-10-CM

## 2019-10-21 DIAGNOSIS — E785 Hyperlipidemia, unspecified: Secondary | ICD-10-CM

## 2019-10-21 DIAGNOSIS — I10 Essential (primary) hypertension: Secondary | ICD-10-CM

## 2019-10-21 DIAGNOSIS — Z23 Encounter for immunization: Secondary | ICD-10-CM

## 2019-10-21 DIAGNOSIS — F419 Anxiety disorder, unspecified: Secondary | ICD-10-CM
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
## 2019-10-21 NOTE — Patient Instructions (Addendum)
Visit Information  Phone number for Pharmacist: [phone]  Thank you for meeting with me to discuss your medications! I look forward to working with you to achieve your health care goals. Below is a summary of what we talked about during the visit:  Goals Addressed            This Visit's Progress   . Pharmacy Care Plan       CARE PLAN ENTRY (see longitudinal plan of care for additional care plan information)  Current Barriers:  . Chronic Disease Management support, education, and care coordination needs related to Hypertension, Hyperlipidemia, and Diabetes   Hypertension BP Readings from Last 3 Encounters:  09/22/19 118/66  09/18/19 140/72  07/10/19 (!) 148/76 .  Pharmacist Clinical Goal(s): o Over the next 30 days, patient will work with PharmD and providers to maintain BP goal <130/80 . Current regimen:  o Bystolic 20 mg daily o Olmesartan 40 mg daily o Furosemide 20 mg twice a day . Interventions: o Discussed BP goals and benefits of medications for prevention of heart attack / stroke . Patient self care activities - Over the next 30 days, patient will: o Check BP as needed, document, and provide at future appointments o Ensure daily salt intake < 2300 mg/day  Hyperlipidemia Lab Results  Component Value Date/Time   LDLCALC 75 04/28/2019 11:26 AM   LDLCALC 90 03/18/2018 09:54 AM .  Pharmacist Clinical Goal(s): o Over the next 30 days, patient will work with PharmD and providers to maintain LDL goal < 100 . Current regimen:  o Livalo 1 mg daily o Aspirin 81 mg daily . Interventions: o Discussed cholesterol goals and benefits of medications for prevention of heart attack / stroke . Patient self care activities - Over the next 30 days, patient will: o Continue current medications  Diabetes Lab Results  Component Value Date/Time   HGBA1C 7.0 (H) 07/07/2019 10:17 AM   HGBA1C 7.6 (H) 02/19/2019 10:31 AM .  Pharmacist Clinical Goal(s): o Over the next 30 days,  patient will work with PharmD and providers to achieve A1c goal <7% . Current regimen:  o Toujeo Max 90 units daily o Novolog 25-30 units before meals o Metformin ER 500 mg - 2 tab twice a day o Pioglitazone 30 mg daily o Ozempic 0.25 mg weekly . Interventions: o Discussed blood sugar goals and benefits of medications for prevention of diabetic complications . Patient self care activities - Over the next 30 days, patient will: o Check blood sugar 3-4 times daily, document, and provide at future appointments o Contact provider with any episodes of hypoglycemia  Depression / Insomnia . Pharmacist Clinical Goal(s) o Over the next 30 days, patient will work with PharmD and providers to optimize therapy . Current regimen:  o Duloxetine 60 mg daily o Trazodone 450 mg at bedtime o Eszoplicone 3 mg at bedtime . Interventions: o Discussed benefits and safety risks with medications . Patient self care activities - Over the next 30 days, patient will: o Continue medications as directed  Medication management . Pharmacist Clinical Goal(s): o Over the next 30 days, patient will work with PharmD and providers to maintain optimal medication adherence . Current pharmacy: [facility] order, [facility] . Interventions o Comprehensive medication review performed. o Continue current medication management strategy . Patient self care activities - Over the next 30 days, patient will: o Focus on medication adherence by fill date o Take medications as prescribed o Report any questions or concerns to PharmD and/or  provider(s)  Initial goal documentation      Nicholas Whitehead was given information about Chronic Care Management services today including:  1. CCM service includes personalized support from designated clinical staff supervised by his physician, including individualized plan of care and coordination with other care providers 2. 24/7 contact phone numbers for assistance for urgent and routine  care needs. 3. Standard insurance, coinsurance, copays and deductibles apply for chronic care management only during months in which we provide at least 20 minutes of these services. Most insurances cover these services at 100%, however patients may be responsible for any copay, coinsurance and/or deductible if applicable. This service may help you avoid the need for more expensive face-to-face services. 4. Only one practitioner may furnish and bill the service in a calendar month. 5. The patient may stop CCM services at any time (effective at the end of the month) by phone call to the office staff.  Patient agreed to services and verbal consent obtained.   Patient verbalizes understanding of instructions provided today.  Telephone follow up appointment with pharmacy team member scheduled for: 1 month  Charlene Brooke, PharmD, BCACP Clinical Pharmacist [facility] [phone]  Diabetes Mellitus and Nutrition, Adult When you have diabetes (diabetes mellitus), it is very important to have healthy eating habits because your blood sugar (glucose) levels are greatly affected by what you eat and drink. Eating healthy foods in the appropriate amounts, at about the same times every day, can help you:  Control your blood glucose.  Lower your risk of heart disease.  Improve your blood pressure.  Reach or maintain a healthy weight. Every person with diabetes is different, and each person has different needs for a meal plan. Your health care provider may recommend that you work with a diet and nutrition specialist (dietitian) to make a meal plan that is best for you. Your meal plan may vary depending on factors such as:  The calories you need.  The medicines you take.  Your weight.  Your blood glucose, blood pressure, and cholesterol levels.  Your activity level.  Other health conditions you have, such as heart or kidney disease. How do carbohydrates affect  me? Carbohydrates, also called carbs, affect your blood glucose level more than any other type of food. Eating carbs naturally raises the amount of glucose in your blood. Carb counting is a method for keeping track of how many carbs you eat. Counting carbs is important to keep your blood glucose at a healthy level, especially if you use insulin or take certain oral diabetes medicines. It is important to know how many carbs you can safely have in each meal. This is different for every person. Your dietitian can help you calculate how many carbs you should have at each meal and for each snack. Foods that contain carbs include:  Bread, cereal, rice, pasta, and crackers.  Potatoes and corn.  Peas, beans, and lentils.  Milk and yogurt.  Fruit and juice.  Desserts, such as cakes, cookies, ice cream, and candy. How does alcohol affect me? Alcohol can cause a sudden decrease in blood glucose (hypoglycemia), especially if you use insulin or take certain oral diabetes medicines. Hypoglycemia can be a life-threatening condition. Symptoms of hypoglycemia (sleepiness, dizziness, and confusion) are similar to symptoms of having too much alcohol. If your health care provider says that alcohol is safe for you, follow these guidelines:  Limit alcohol intake to no more than 1 drink per day for nonpregnant women and  2 drinks per day for men. One drink equals 12 oz of beer, 5 oz of wine, or 1 oz of hard liquor.  Do not drink on an empty stomach.  Keep yourself hydrated with water, diet soda, or unsweetened iced tea.  Keep in mind that regular soda, juice, and other mixers may contain a lot of sugar and must be counted as carbs. What are tips for following this plan?  Reading food labels  Start by checking the serving size on the "Nutrition Facts" label of packaged foods and drinks. The amount of calories, carbs, fats, and other nutrients listed on the label is based on one serving of the item. Many items  contain more than one serving per package.  Check the total grams (g) of carbs in one serving. You can calculate the number of servings of carbs in one serving by dividing the total carbs by 15. For example, if a food has 30 g of total carbs, it would be equal to 2 servings of carbs.  Check the number of grams (g) of saturated and trans fats in one serving. Choose foods that have low or no amount of these fats.  Check the number of milligrams (mg) of salt (sodium) in one serving. Most people should limit total sodium intake to less than 2,300 mg per day.  Always check the nutrition information of foods labeled as "low-fat" or "nonfat". These foods may be higher in added sugar or refined carbs and should be avoided.  Talk to your dietitian to identify your daily goals for nutrients listed on the label. Shopping  Avoid buying canned, premade, or processed foods. These foods tend to be high in fat, sodium, and added sugar.  Shop around the outside edge of the grocery store. This includes fresh fruits and vegetables, bulk grains, fresh meats, and fresh dairy. Cooking  Use low-heat cooking methods, such as baking, instead of high-heat cooking methods like deep frying.  Cook using healthy oils, such as olive, canola, or sunflower oil.  Avoid cooking with butter, cream, or high-fat meats. Meal planning  Eat meals and snacks regularly, preferably at the same times every day. Avoid going long periods of time without eating.  Eat foods high in fiber, such as fresh fruits, vegetables, beans, and whole grains. Talk to your dietitian about how many servings of carbs you can eat at each meal.  Eat 4-6 ounces (oz) of lean protein each day, such as lean meat, chicken, fish, eggs, or tofu. One oz of lean protein is equal to: ? 1 oz of meat, chicken, or fish. ? 1 egg. ?  cup of tofu.  Eat some foods each day that contain healthy fats, such as avocado, nuts, seeds, and fish. Lifestyle  Check your  blood glucose regularly.  Exercise regularly as told by your health care provider. This may include: ? 150 minutes of moderate-intensity or vigorous-intensity exercise each week. This could be brisk walking, biking, or water aerobics. ? Stretching and doing strength exercises, such as yoga or weightlifting, at least 2 times a week.  Take medicines as told by your health care provider.  Do not use any products that contain nicotine or tobacco, such as cigarettes and e-cigarettes. If you need help quitting, ask your health care provider.  Work with a Social worker or diabetes educator to identify strategies to manage stress and any emotional and social challenges. Questions to ask a health care provider  Do I need to meet with a diabetes educator?  Do  I need to meet with a dietitian?  What number can I call if I have questions?  When are the best times to check my blood glucose? Where to find more information:  American Diabetes Association: diabetes.org  Academy of Nutrition and Dietetics: www.eatright.[employer] of Diabetes and Digestive and Kidney Diseases (NIH): [employer] Summary  A healthy meal plan will help you control your blood glucose and maintain a healthy lifestyle.  Working with a diet and nutrition specialist (dietitian) can help you make a meal plan that is best for you.  Keep in mind that carbohydrates (carbs) and alcohol have immediate effects on your blood glucose levels. It is important to count carbs and to use alcohol carefully. This information is not intended to replace advice given to you by your health care provider. Make sure you discuss any questions you have with your health care provider. Document Revised: 12/08/2016 Document Reviewed: 01/31/2016 Elsevier Patient Education  2020 [employer].

## 2019-10-21 NOTE — Progress Notes (Signed)
° °  Covid-19 Vaccination Clinic  Name:  Nicholas Whitehead    MRN: [id number] DOB: 1955-08-22  10/21/2019  Nicholas Whitehead was observed post Covid-19 immunization for 15 minutes without incident. He was provided with Vaccine Information Sheet and instruction to access the V-Safe system.   Nicholas Whitehead was instructed to call 911 with any severe reactions post vaccine:  Difficulty breathing   Swelling of face and throat   A fast heartbeat   A bad rash all over body   Dizziness and weakness   Immunizations Administered    Name Date Dose VIS Date Route   Pfizer COVID-19 Vaccine 10/21/2019  9:49 AM 0.3 mL 03/05/2018 Intramuscular   Manufacturer: [place]   Lot: [id number]   [place]: [id number]

## 2019-10-26 ENCOUNTER — Other Ambulatory Visit: Payer: Self-pay | Admitting: Endocrinology

## 2019-10-27 ENCOUNTER — Ambulatory Visit (INDEPENDENT_AMBULATORY_CARE_PROVIDER_SITE_OTHER): Payer: Medicare Other

## 2019-10-27 ENCOUNTER — Telehealth: Payer: Self-pay

## 2019-10-27 DIAGNOSIS — Z0000 Encounter for general adult medical examination without abnormal findings: Secondary | ICD-10-CM

## 2019-10-27 NOTE — Patient Instructions (Signed)
Nicholas Whitehead , Thank you for taking time to come for your Medicare Wellness Visit. I appreciate your ongoing commitment to your health goals. Please review the following plan we discussed and let me know if I can assist you in the future.   Screening recommendations/referrals: Colonoscopy: 04/24/2012; due every 10 years Recommended yearly ophthalmology/optometry visit for glaucoma screening and checkup Recommended yearly dental visit for hygiene and checkup  Vaccinations: Influenza vaccine: 09/25/2018 Pneumococcal vaccine: up to date Tdap vaccine: 05/21/2017; due every 10 years Shingles vaccine: never done Covid-19: up to date  Advanced directives: Advance directive discussed with you today. Even though you declined this today please call our office should you change your mind and we can give you the proper paperwork for you to fill out.  Conditions/risks identified: Yes; Reviewed health maintenance screenings with patient today and relevant education, vaccines, and/or referrals were provided. Please continue to do your personal lifestyle choices by: daily care of teeth and gums, regular physical activity (goal should be 5 days a week for 30 minutes), eat a healthy diet, avoid tobacco and drug use, limiting any alcohol intake, taking a low-dose aspirin (if not allergic or have been advised by your provider otherwise) and taking vitamins and minerals as recommended by your provider. Continue doing brain stimulating activities (puzzles, reading, adult coloring books, staying active) to keep memory sharp. Continue to eat heart healthy diet (full of fruits, vegetables, whole grains, lean protein, water--limit salt, fat, and sugar intake) and increase physical activity as tolerated.  Next appointment: Please schedule your next Medicare Wellness Visit with your Nurse Health Advisor in 1 year by calling [phone].  Preventive Care 40-64 Years, Male Preventive care refers to lifestyle choices and visits  with your health care provider that can promote health and wellness. What does preventive care include?  A yearly physical exam. This is also called an annual well check.  Dental exams once or twice a year.  Routine eye exams. Ask your health care provider how often you should have your eyes checked.  Personal lifestyle choices, including:  Daily care of your teeth and gums.  Regular physical activity.  Eating a healthy diet.  Avoiding tobacco and drug use.  Limiting alcohol use.  Practicing safe sex.  Taking low-dose aspirin every day starting at age 72. What happens during an annual well check? The services and screenings done by your health care provider during your annual well check will depend on your age, overall health, lifestyle risk factors, and family history of disease. Counseling  Your health care provider may ask you questions about your:  Alcohol use.  Tobacco use.  Drug use.  Emotional well-being.  Home and relationship well-being.  Sexual activity.  Eating habits.  Work and work Statistician. Screening  You may have the following tests or measurements:  Height, weight, and BMI.  Blood pressure.  Lipid and cholesterol levels. These may be checked every 5 years, or more frequently if you are over 46 years old.  Skin check.  Lung cancer screening. You may have this screening every year starting at age 37 if you have a 30-pack-year history of smoking and currently smoke or have quit within the past 15 years.  Fecal occult blood test (FOBT) of the stool. You may have this test every year starting at age 33.  Flexible sigmoidoscopy or colonoscopy. You may have a sigmoidoscopy every 5 years or a colonoscopy every 10 years starting at age 35.  Prostate cancer screening. Recommendations will vary depending  on your family history and other risks.  Hepatitis C blood test.  Hepatitis B blood test.  Sexually transmitted disease (STD)  testing.  Diabetes screening. This is done by checking your blood sugar (glucose) after you have not eaten for a while (fasting). You may have this done every 1-3 years. Discuss your test results, treatment options, and if necessary, the need for more tests with your health care provider. Vaccines  Your health care provider may recommend certain vaccines, such as:  Influenza vaccine. This is recommended every year.  Tetanus, diphtheria, and acellular pertussis (Tdap, Td) vaccine. You may need a Td booster every 10 years.  Zoster vaccine. You may need this after age 61.  Pneumococcal 13-valent conjugate (PCV13) vaccine. You may need this if you have certain conditions and have not been vaccinated.  Pneumococcal polysaccharide (PPSV23) vaccine. You may need one or two doses if you smoke cigarettes or if you have certain conditions. Talk to your health care provider about which screenings and vaccines you need and how often you need them. This information is not intended to replace advice given to you by your health care provider. Make sure you discuss any questions you have with your health care provider. Document Released: 01/22/2015 Document Revised: 09/15/2015 Document Reviewed: 10/27/2014 Elsevier Interactive Patient Education  2017 [place] Prevention in the Home Falls can cause injuries. They can happen to people of all ages. There are many things you can do to make your home safe and to help prevent falls. What can I do on the outside of my home?  Regularly fix the edges of walkways and driveways and fix any cracks.  Remove anything that might make you trip as you walk through a door, such as a raised step or threshold.  Trim any bushes or trees on the path to your home.  Use bright outdoor lighting.  Clear any walking paths of anything that might make someone trip, such as rocks or tools.  Regularly check to see if handrails are loose or broken. Make sure that  both sides of any steps have handrails.  Any raised decks and porches should have guardrails on the edges.  Have any leaves, snow, or ice cleared regularly.  Use sand or salt on walking paths during winter.  Clean up any spills in your garage right away. This includes oil or grease spills. What can I do in the bathroom?  Use night lights.  Install grab bars by the toilet and in the tub and shower. Do not use towel bars as grab bars.  Use non-skid mats or decals in the tub or shower.  If you need to sit down in the shower, use a plastic, non-slip stool.  Keep the floor dry. Clean up any water that spills on the floor as soon as it happens.  Remove soap buildup in the tub or shower regularly.  Attach bath mats securely with double-sided non-slip rug tape.  Do not have throw rugs and other things on the floor that can make you trip. What can I do in the bedroom?  Use night lights.  Make sure that you have a light by your bed that is easy to reach.  Do not use any sheets or blankets that are too big for your bed. They should not hang down onto the floor.  Have a firm chair that has side arms. You can use this for support while you get dressed.  Do not have throw rugs and other  things on the floor that can make you trip. What can I do in the kitchen?  Clean up any spills right away.  Avoid walking on wet floors.  Keep items that you use a lot in easy-to-reach places.  If you need to reach something above you, use a strong step stool that has a grab bar.  Keep electrical cords out of the way.  Do not use floor polish or wax that makes floors slippery. If you must use wax, use non-skid floor wax.  Do not have throw rugs and other things on the floor that can make you trip. What can I do with my stairs?  Do not leave any items on the stairs.  Make sure that there are handrails on both sides of the stairs and use them. Fix handrails that are broken or loose. Make sure  that handrails are as long as the stairways.  Check any carpeting to make sure that it is firmly attached to the stairs. Fix any carpet that is loose or worn.  Avoid having throw rugs at the top or bottom of the stairs. If you do have throw rugs, attach them to the floor with carpet tape.  Make sure that you have a light switch at the top of the stairs and the bottom of the stairs. If you do not have them, ask someone to add them for you. What else can I do to help prevent falls?  Wear shoes that:  Do not have high heels.  Have rubber bottoms.  Are comfortable and fit you well.  Are closed at the toe. Do not wear sandals.  If you use a stepladder:  Make sure that it is fully opened. Do not climb a closed stepladder.  Make sure that both sides of the stepladder are locked into place.  Ask someone to hold it for you, if possible.  Clearly mark and make sure that you can see:  Any grab bars or handrails.  First and last steps.  Where the edge of each step is.  Use tools that help you move around (mobility aids) if they are needed. These include:  Canes.  Walkers.  Scooters.  Crutches.  Turn on the lights when you go into a dark area. Replace any light bulbs as soon as they burn out.  Set up your furniture so you have a clear path. Avoid moving your furniture around.  If any of your floors are uneven, fix them.  If there are any pets around you, be aware of where they are.  Review your medicines with your doctor. Some medicines can make you feel dizzy. This can increase your chance of falling. Ask your doctor what other things that you can do to help prevent falls. This information is not intended to replace advice given to you by your health care provider. Make sure you discuss any questions you have with your health care provider. Document Released: 10/22/2008 Document Revised: 06/03/2015 Document Reviewed: 01/30/2014 Elsevier Interactive Patient Education   2017 [employer].

## 2019-10-27 NOTE — Progress Notes (Signed)
I connected with Nicholas Whitehead today by telephone and verified that I am speaking with the correct person using two identifiers. Location patient: home Location provider: work Persons participating in the virtual visit: Birch Farino and Lisette Abu, LPN.   I discussed the limitations, risks, security and privacy concerns of performing an evaluation and management service by telephone and the availability of in person appointments. I also discussed with the patient that there may be a patient responsible charge related to this service. The patient expressed understanding and verbally consented to this telephonic visit.    Interactive audio and video telecommunications were attempted between this provider and patient, however failed, due to patient having technical difficulties OR patient did not have access to video capability.  We continued and completed visit with audio only.  Some vital signs may be absent or patient reported.   Time Spent with patient on telephone encounter: 30 minutes  Subjective:   Nicholas Whitehead is a 64 y.o. male who presents for Medicare Annual/Subsequent preventive examination.  Review of Systems    No ROS. Medicare Wellness Visit Cardiac Risk Factors include: advanced age (>9men, >5 women);diabetes mellitus;dyslipidemia;family history of premature cardiovascular disease;hypertension;male gender;smoking/ tobacco exposure     Objective:    There were no vitals filed for this visit. There is no height or weight on file to calculate BMI.  Advanced Directives 10/27/2019 10/25/2018 03/14/2018 02/25/2018 09/11/2016 08/22/2016 01/16/2012  Does Patient Have a Medical Advance Directive? No Yes No No No;Yes Yes Patient has advance directive, copy not in chart  Type of Advance Directive - [place];Living will - - - [place];Living will Living will  Copy of [place] in Chart? - No - copy requested - - - No - copy  requested Copy requested from family  Would patient like information on creating a medical advance directive? No - Patient declined - No - Patient declined No - Patient declined - - -  Pre-existing out of facility DNR order (yellow form or pink MOST form) - - - - - - No    Current Medications (verified) Outpatient Encounter Medications as of 10/27/2019  Medication Sig  . albuterol (PROVENTIL HFA;VENTOLIN HFA) 108 (90 Base) MCG/ACT inhaler Inhale 2 puffs into the lungs every 6 (six) hours as needed for wheezing or shortness of breath.  . Alcohol Swabs (B-D SINGLE USE SWABS REGULAR) PADS USE AS DIRECTED  . aspirin EC 81 MG tablet Take 81 mg by mouth every morning.  Marland Kitchen BEVESPI AEROSPHERE 9-4.8 MCG/ACT AERO INHALE 2 PUFFS INTO THE LUNGS TWO TIMES DAILY  . BYSTOLIC 20 MG TABS TAKE 1 TABLET EVERY DAY  . cetirizine (ZYRTEC) 10 MG tablet Take 10 mg by mouth daily.  . Cholecalciferol 2000 units TABS Take 1 tablet (2,000 Units total) by mouth daily.  . DROPLET PEN NEEDLES 31G X 8 MM MISC USE TO INJECT INSULIN  . DULoxetine (CYMBALTA) 60 MG capsule TAKE 1 CAPSULE EVERY DAY  . Eszopiclone 3 MG TABS TAKE 1 TABLET BY MOUTH IMMEDIATELY BEFORE BEDTIME  . furosemide (LASIX) 20 MG tablet TAKE 1 TABLET TWICE DAILY  . gabapentin (NEURONTIN) 300 MG capsule TAKE 1 CAPSULE THREE TIMES DAILY  . glucose blood (ACCU-CHEK GUIDE) test strip TEST BLOOD SUGAR FOUR TIMES DAILY  . Lancets MISC Use to test blood sugar tid. DX: E11.8  . linaclotide (LINZESS) 72 MCG capsule Take 1 capsule (72 mcg total) by mouth daily before breakfast.  . metFORMIN (GLUCOPHAGE-XR) 500 MG  24 hr tablet TAKE 2 TABLETS TWICE DAILY  . NOVOLOG FLEXPEN 100 UNIT/ML FlexPen ADMINISTER 25 TO 30 UNITS UNDER THE SKIN THREE TIMES DAILY BEFORE MEALS. MAY TAKE LESS IF SUGAR IS BELOW 120 OR EATING A SMALL MEAL  . ODEFSEY 200-25-25 MG TABS tablet TAKE 1 TABLET BY MOUTH EVERY DAY  . olmesartan (BENICAR) 40 MG tablet Take 1 tablet (40 mg total) by mouth daily.    . pioglitazone (ACTOS) 30 MG tablet TAKE 1 TABLET EVERY DAY  . Pitavastatin Calcium (LIVALO) 1 MG TABS Take 1 tablet (1 mg total) by mouth daily.  . Semaglutide,0.25 or 0.5MG /DOS, (OZEMPIC, 0.25 OR 0.5 MG/DOSE,) 2 MG/1.5ML SOPN Inject 0.5 mg into the skin once a week. Start with 0.25mg  weekly for 4 weeks  . TOUJEO MAX SOLOSTAR 300 UNIT/ML Solostar Pen ADMINISTER 86 UNITS UNDER THE SKIN DAILY  . traZODone (DESYREL) 150 MG tablet Take 3 tablets (450 mg total) by mouth at bedtime.  . trazodone (DESYREL) 300 MG tablet TAKE 1 TABLET AT BEDTIME  . triamcinolone cream (KENALOG) 0.5 % APPLY 1 APPLICATION TOPICALLY THREE TIME A DAY  . [DISCONTINUED] Alcohol Swabs (B-D SINGLE USE SWABS REGULAR) PADS USE AS DIRECTED  . [DISCONTINUED] Alcohol Swabs (B-D SINGLE USE SWABS REGULAR) PADS USE AS DIRECTED  . [DISCONTINUED] DULoxetine (CYMBALTA) 60 MG capsule TAKE 1 CAPSULE EVERY DAY  . [DISCONTINUED] DULoxetine (CYMBALTA) 60 MG capsule TAKE 1 CAPSULE EVERY DAY  . [DISCONTINUED] furosemide (LASIX) 20 MG tablet TAKE 1 TABLET TWICE DAILY  . [DISCONTINUED] metFORMIN (GLUCOPHAGE-XR) 500 MG 24 hr tablet TAKE 2 TABLETS BY MOUTH TWICE DAILY  . [DISCONTINUED] Nebivolol HCl (BYSTOLIC) 20 MG TABS Take 1 tablet (20 mg total) by mouth daily.  . [DISCONTINUED] olmesartan-hydrochlorothiazide (BENICAR HCT) 40-12.5 MG tablet TAKE 1 TABLET EVERY DAY  . [DISCONTINUED] pioglitazone (ACTOS) 30 MG tablet Take 1 tablet (30 mg total) by mouth daily.   No facility-administered encounter medications on file as of 10/27/2019.    Allergies (verified) Benazepril, Molds & smuts, Pravastatin, and Glipizide   History: Past Medical History:  Diagnosis Date  . Anxiety   . Cataract    right   . Depression   . Diabetes mellitus without complication ([place])   . DM (diabetes mellitus screen)   . HIV disease ([place]) 09/12/1995  . HIV infection ([place])   . Hypercholesterolemia   . Hyperlipidemia   . Hypertension    Past Surgical History:   Procedure Laterality Date  . COLONOSCOPY WITH PROPOFOL  01/16/2012   Procedure: COLONOSCOPY WITH PROPOFOL;  Surgeon: Garlan Fair, MD;  Location: [facility];  Service: Endoscopy;  Laterality: N/A;  . EYE SURGERY  2005   catarct surgery  . optic lens surgery  2005   Family History  Problem Relation Age of Onset  . Arthritis Mother   . Diabetes Mother   . Heart disease Mother   . Stroke Mother   . Arthritis Father   . Diabetes Father   . Heart disease Father    Social History   Socioeconomic History  . Marital status: Single    Spouse name: Not on file  . Number of children: Not on file  . Years of education: Not on file  . Highest education level: Not on file  Occupational History  . Occupation: disability  Tobacco Use  . Smoking status: Current Every Day Smoker    Packs/day: 0.25    Types: Cigarettes  . Smokeless tobacco: Never Used  Vaping Use  . Vaping Use:  Never used  Substance and Sexual Activity  . Alcohol use: No    Alcohol/week: 0.0 standard drinks    Comment: socially  . Drug use: No    Comment: rarely  . Sexual activity: Never    Birth control/protection: Other-see comments    Comment: declined condoms  Other Topics Concern  . Not on file  Social History Narrative  . Not on file   Social Determinants of Health   Financial Resource Strain: Low Risk   . Difficulty of Paying Living Expenses: Not hard at all  Food Insecurity: No Food Insecurity  . Worried About Charity fundraiser in the Last Year: Never true  . Ran Out of Food in the Last Year: Never true  Transportation Needs: No Transportation Needs  . Lack of Transportation (Medical): No  . Lack of Transportation (Non-Medical): No  Physical Activity: Inactive  . Days of Exercise per Week: 0 days  . Minutes of Exercise per Session: 0 min  Stress: No Stress Concern Present  . Feeling of Stress : Not at all  Social Connections: Socially Isolated  . Frequency of Communication with Friends and  Family: More than three times a week  . Frequency of Social Gatherings with Friends and Family: More than three times a week  . Attends Religious Services: Never  . Active Member of Clubs or Organizations: No  . Attends Archivist Meetings: Never  . Marital Status: Never married    Tobacco Counseling Ready to quit: Not Answered Counseling given: Not Answered   Clinical Intake:  Pre-visit preparation completed: Yes  Pain : No/denies pain     Nutritional Risks: None Diabetes: Yes CBG done?: No Did pt. bring in CBG monitor from home?: No  How often do you need to have someone help you when you read instructions, pamphlets, or other written materials from your doctor or pharmacy?: 1 - Never What is the last grade level you completed in school?: 2nd year of collgee at [employer]  Diabetic? yes  Interpreter Needed?: No  Information entered by :: Ewell Benassi N. Amous Crewe, LPN   Activities of Daily Living In your present state of health, do you have any difficulty performing the following activities: 10/27/2019  Hearing? N  Vision? N  Difficulty concentrating or making decisions? N  Walking or climbing stairs? N  Dressing or bathing? N  Doing errands, shopping? N  Preparing Food and eating ? N  Using the Toilet? N  In the past six months, have you accidently leaked urine? N  Do you have problems with loss of bowel control? N  Managing your Medications? N  Managing your Finances? N  Housekeeping or managing your Housekeeping? N  Some recent data might be hidden    Patient Care Team: [name], MD as PCP - General (Internal Medicine) [name], Okey Regal, MD as PCP - Infectious Diseases (Infectious Diseases) Merita Norton, LCSW as Counselor Foltanski, Cleaster Corin, [facility] as Pharmacist (Pharmacist)  Indicate any recent Medical Services you may have received from other than [facility] providers in the past year (date may be approximate).     Assessment:    This is a routine wellness examination for [place].  Hearing/Vision screen No exam data present  Dietary issues and exercise activities discussed: Current Exercise Habits: The patient does not participate in regular exercise at present  Goals    . Patient Stated    . Patient Stated     Maintain my health the very  best I can.    Marland Kitchen Pharmacy Care Plan     CARE PLAN ENTRY (see longitudinal plan of care for additional care plan information)  Current Barriers:  . Chronic Disease Management support, education, and care coordination needs related to Hypertension, Hyperlipidemia, and Diabetes   Hypertension BP Readings from Last 3 Encounters:  09/22/19 118/66  09/18/19 140/72  07/10/19 (!) 148/76 .  Pharmacist Clinical Goal(s): o Over the next 30 days, patient will work with PharmD and providers to maintain BP goal <130/80 . Current regimen:  o Bystolic 20 mg daily o Olmesartan 40 mg daily o Furosemide 20 mg twice a day . Interventions: o Discussed BP goals and benefits of medications for prevention of heart attack / stroke . Patient self care activities - Over the next 30 days, patient will: o Check BP as needed, document, and provide at future appointments o Ensure daily salt intake < 2300 mg/day  Hyperlipidemia Lab Results  Component Value Date/Time   LDLCALC 75 04/28/2019 11:26 AM   LDLCALC 90 03/18/2018 09:54 AM .  Pharmacist Clinical Goal(s): o Over the next 30 days, patient will work with PharmD and providers to maintain LDL goal < 100 . Current regimen:  o Livalo 1 mg daily o Aspirin 81 mg daily . Interventions: o Discussed cholesterol goals and benefits of medications for prevention of heart attack / stroke . Patient self care activities - Over the next 30 days, patient will: o Continue current medications  Diabetes Lab Results  Component Value Date/Time   HGBA1C 7.0 (H) 07/07/2019 10:17 AM   HGBA1C 7.6 (H) 02/19/2019 10:31 AM .  Pharmacist Clinical  Goal(s): o Over the next 30 days, patient will work with PharmD and providers to achieve A1c goal <7% . Current regimen:  o Toujeo Max 90 units daily o Novolog 25-30 units before meals o Metformin ER 500 mg - 2 tab twice a day o Pioglitazone 30 mg daily o Ozempic 0.25 mg weekly . Interventions: o Discussed blood sugar goals and benefits of medications for prevention of diabetic complications . Patient self care activities - Over the next 30 days, patient will: o Check blood sugar 3-4 times daily, document, and provide at future appointments o Contact provider with any episodes of hypoglycemia  Depression / Insomnia . Pharmacist Clinical Goal(s) o Over the next 30 days, patient will work with PharmD and providers to optimize therapy . Current regimen:  o Duloxetine 60 mg daily o Trazodone 450 mg at bedtime o Eszoplicone 3 mg at bedtime . Interventions: o Discussed benefits and safety risks with medications . Patient self care activities - Over the next 30 days, patient will: o Continue medications as directed  Medication management . Pharmacist Clinical Goal(s): o Over the next 30 days, patient will work with PharmD and providers to maintain optimal medication adherence . Current pharmacy: [facility] order, [facility] . Interventions o Comprehensive medication review performed. o Continue current medication management strategy . Patient self care activities - Over the next 30 days, patient will: o Focus on medication adherence by fill date o Take medications as prescribed o Report any questions or concerns to PharmD and/or provider(s)  Initial goal documentation      Depression Screen PHQ 2/9 Scores 10/27/2019 10/27/2019 07/02/2019 03/20/2019 10/25/2018 09/25/2018 05/28/2018  PHQ - 2 Score 1 1 3 2 6 4 4   PHQ- 9 Score - - - 8 16 10 18   Exception Documentation - - - - - - -    Fall  Risk Fall Risk  10/27/2019 07/02/2019 10/25/2018 05/28/2018 04/01/2018  Falls in the past year?  0 0 0 0 0  Comment - - - - -  Number falls in past yr: 0 - 0 - 1  Injury with Fall? 0 - 0 - 0  Risk Factor Category  - - - - -  Risk for fall due to : No Fall Risks No Fall Risks - - -  Follow up Falls evaluation completed Falls evaluation completed - - -    Any stairs in or around the home? Yes  If so, are there any without handrails? No  Home free of loose throw rugs in walkways, pet beds, electrical cords, etc? Yes  Adequate lighting in your home to reduce risk of falls? Yes   ASSISTIVE DEVICES UTILIZED TO PREVENT FALLS:  Life alert? No  Use of a cane, walker or w/c? No  Grab bars in the bathroom? No  Shower chair or bench in shower? Yes  Elevated toilet seat or a handicapped toilet? No   TIMED UP AND GO:  Was the test performed? No .  Length of time to ambulate 10 feet: 0 sec.   Gait steady and fast without use of assistive device  Cognitive Function: MMSE - Mini Mental State Exam 10/25/2018  Not completed: Refused   Montreal Cognitive Assessment  06/28/2015  Visuospatial/ Executive (0/5) 3  Naming (0/3) 3  Attention: Read list of digits (0/2) 2  Attention: Read list of letters (0/1) 1  Attention: Serial 7 subtraction starting at 100 (0/3) 0  Language: Repeat phrase (0/2) 1  Language : Fluency (0/1) 1  Abstraction (0/2) 2  Delayed Recall (0/5) 3  Orientation (0/6) 6  Total 22  Adjusted Score (based on education) 22      Immunizations Immunization History  Administered Date(s) Administered  . Hepatitis A 01/29/2002  . Influenza Split 09/26/2011  . Influenza Whole 10/14/2001, 09/02/2010  . Influenza, High Dose Seasonal PF 09/06/2015  . Influenza,inj,Quad PF,6+ Mos 09/19/2012, 09/11/2013, 09/22/2014, 03/02/2015, 10/24/2016, 10/23/2017, 09/25/2018  . PFIZER SARS-COV-2 Vaccination 09/30/2019, 10/21/2019  . PPD Test 09/14/1995  . Pneumococcal Conjugate-13 01/21/2014  . Pneumococcal Polysaccharide-23 12/16/2001, 09/02/2010, 09/06/2015  . Tdap 05/21/2013     TDAP status: Up to date Flu Vaccine status: Up to date Pneumococcal vaccine status: Up to date Covid-19 vaccine status: Completed vaccines  Qualifies for Shingles Vaccine? Yes   Zostavax completed No   Shingrix Completed?: No.    Education has been provided regarding the importance of this vaccine. Patient has been advised to call insurance company to determine out of pocket expense if they have not yet received this vaccine. Advised may also receive vaccine at local pharmacy or Health Dept. Verbalized acceptance and understanding.  Screening Tests Health Maintenance  Topic Date Due  . INFLUENZA VACCINE  04/08/2020 (Originally 08/10/2019)  . HEMOGLOBIN A1C  01/06/2020  . OPHTHALMOLOGY EXAM  06/24/2020  . FOOT EXAM  07/09/2020  . COLONOSCOPY  04/25/2022  . TETANUS/TDAP  05/22/2023  . PNEUMOCOCCAL POLYSACCHARIDE VACCINE AGE 25-64 HIGH RISK  Completed  . COVID-19 Vaccine  Completed  . Hepatitis C Screening  Completed  . HIV Screening  Completed    Health Maintenance  There are no preventive care reminders to display for this patient.  Colorectal cancer screening: Completed 04/24/2012. Repeat every 10 years  Lung Cancer Screening: (Low Dose CT Chest recommended if Age 24-80 years, 30 pack-year currently smoking OR have quit w/in 15years.) does qualify.   Lung  Cancer Screening Referral: no  Additional Screening:  Hepatitis C Screening: does qualify; Completed yes  Vision Screening: Recommended annual ophthalmology exams for early detection of glaucoma and other disorders of the eye. Is the patient up to date with their annual eye exam?  Yes  Who is the provider or what is the name of the office in which the patient attends annual eye exams? [facility] If pt is not established with a provider, would they like to be referred to a provider to establish care? No .   Dental Screening: Recommended annual dental exams for proper oral hygiene  Community Resource Referral /  Chronic Care Management: CRR required this visit?  No   CCM required this visit?  No      Plan:     I have personally reviewed and noted the following in the patient's chart:   . Medical and social history . Use of alcohol, tobacco or illicit drugs  . Current medications and supplements . Functional ability and status . Nutritional status . Physical activity . Advanced directives . List of other physicians . Hospitalizations, surgeries, and ER visits in previous 12 months . Vitals . Screenings to include cognitive, depression, and falls . Referrals and appointments  In addition, I have reviewed and discussed with patient certain preventive protocols, quality metrics, and best practice recommendations. A written personalized care plan for preventive services as well as general preventive health recommendations were provided to patient.     Sheral Flow, LPN   [id number]   Nurse Notes:  Patient is cogitatively intact. There were no vitals filed for this visit. There is no height or weight on file to calculate BMI. Patient stated that she has no issues with gait or balance; does not use any assistive devices.

## 2019-10-27 NOTE — Telephone Encounter (Signed)
Spoke with patient and dis Phone AWV.

## 2019-11-01 ENCOUNTER — Other Ambulatory Visit: Payer: Self-pay | Admitting: Internal Medicine

## 2019-11-01 DIAGNOSIS — E785 Hyperlipidemia, unspecified: Secondary | ICD-10-CM

## 2019-11-06 DIAGNOSIS — I10 Essential (primary) hypertension: Secondary | ICD-10-CM | POA: Diagnosis not present

## 2019-11-18 ENCOUNTER — Other Ambulatory Visit: Payer: Self-pay | Admitting: *Deleted

## 2019-11-18 MED ORDER — INSULIN LISPRO (1 UNIT DIAL) 100 UNIT/ML (KWIKPEN): 25.0000 [IU] | PEN_INJECTOR | Freq: Three times a day (TID) | SUBCUTANEOUS | 3 refills | Status: DC

## 2019-11-20 ENCOUNTER — Other Ambulatory Visit: Payer: Self-pay

## 2019-11-20 ENCOUNTER — Ambulatory Visit: Payer: Medicare Other | Admitting: Pharmacist

## 2019-11-20 DIAGNOSIS — I10 Essential (primary) hypertension: Secondary | ICD-10-CM

## 2019-11-20 DIAGNOSIS — E1140 Type 2 diabetes mellitus with diabetic neuropathy, unspecified: Secondary | ICD-10-CM

## 2019-11-20 DIAGNOSIS — Z794 Long term (current) use of insulin: Secondary | ICD-10-CM

## 2019-11-20 DIAGNOSIS — E785 Hyperlipidemia, unspecified: Secondary | ICD-10-CM

## 2019-11-20 NOTE — Patient Instructions (Signed)
Visit Information  Phone number for Pharmacist: [phone]  Goals Addressed            This [place] (see longitudinal plan of care for additional care plan information)  Current Barriers:   Chronic Disease Management support, education, and care coordination needs related to Hypertension, Hyperlipidemia, and Diabetes   Hypertension BP Readings from Last 3 Encounters:  09/22/19 118/66  09/18/19 140/72  07/10/19 (!) 148/76   Pharmacist Clinical Goal(s): o Over the next 30 days, patient will work with PharmD and providers to maintain BP goal <130/80  Current regimen:  o Bystolic 20 mg daily o Olmesartan 40 mg daily o Furosemide 20 mg twice a day  Interventions: o Discussed BP goals and benefits of medications for prevention of heart attack / stroke  Patient self care activities - Over the next 30 days, patient will: o Check BP as needed, document, and provide at future appointments o Ensure daily salt intake < 2300 mg/day  Hyperlipidemia Lab Results  Component Value Date/Time   LDLCALC 75 04/28/2019 11:26 AM   LDLCALC 90 03/18/2018 09:54 AM   Pharmacist Clinical Goal(s): o Over the next 30 days, patient will work with PharmD and providers to maintain LDL goal < 100  Current regimen:  o Livalo 1 mg daily o Aspirin 81 mg daily  Interventions: o Discussed cholesterol goals and benefits of medications for prevention of heart attack / stroke  Patient self care activities - Over the next 30 days, patient will: o Continue current medications  Diabetes Lab Results  Component Value Date/Time   HGBA1C 7.0 (H) 07/07/2019 10:17 AM   HGBA1C 7.6 (H) 02/19/2019 10:31 AM   Pharmacist Clinical Goal(s): o Over the next 30 days, patient will work with PharmD and providers to achieve A1c goal <7%  Current regimen:  o Toujeo Max 90 units daily o Novolog 25-30 units before meals o Metformin ER 500 mg - 2 tab twice a  day o Pioglitazone 30 mg daily o Ozempic 0.25 mg weekly  Interventions: o Discussed blood sugar goals and benefits of medications for prevention of diabetic complications  Patient self care activities - Over the next 30 days, patient will: o Check blood sugar 3-4 times daily, document, and provide at future appointments o Contact provider with any episodes of hypoglycemia  Depression / Insomnia  Pharmacist Clinical Goal(s) o Over the next 30 days, patient will work with PharmD and providers to optimize therapy  Current regimen:  o Duloxetine 60 mg daily o Trazodone 450 mg at bedtime o Eszoplicone 3 mg at bedtime  Interventions: o Discussed benefits and safety risks with medications  Patient self care activities - Over the next 30 days, patient will: o Continue medications as directed  Medication management  Pharmacist Clinical Goal(s): o Over the next 30 days, patient will work with PharmD and providers to maintain optimal medication adherence  Current pharmacy[facility]  Interventions o Comprehensive medication review performed. o Continue current medication management strategy o Patient is switching insurance from [place] to [employer] - needs submission of tier exceptions for Branded products by January  Patient self care activities - Over the next 30 days, patient will: o Focus on medication adherence by fill date o Take medications as prescribed o Report any questions or concerns to PharmD and/or provider(s)  Please see past updates related to this goal by clicking on the "Past Updates" button in  the selected goal       Patient verbalizes understanding of instructions provided today.  Telephone follow up appointment with pharmacy team member scheduled for: 1 month  Charlene Brooke, PharmD, [facility] Clinical Pharmacist [facility] [phone]

## 2019-11-20 NOTE — Chronic Care Management (AMB) (Signed)
Chronic Care Management Pharmacy  Name: Nicholas Whitehead  MRN: [id number] DOB: [birth year]   Chief Complaint/ HPI  Debbe Bales,  64 y.o. , male presents for their Follow-Up CCM visit with the clinical pharmacist via telephone due to COVID-19 Pandemic.  PCP : [name], MD  Patient Care Team: [name], MD as PCP - General (Internal Medicine) [name], Okey Regal, MD as PCP - Infectious Diseases (Infectious Diseases) Merita Norton, LCSW as Counselor Arsen Mangione, Cleaster Corin, [facility] as Pharmacist (Pharmacist) Calvert Cantor, MD as Consulting Physician (Ophthalmology)  Their chronic conditions include: Hypertension, Hyperlipidemia, Diabetes, Chronic Kidney Disease, Depression, Anxiety, Tobacco use, BPH and HIV , Chronic bronchitis  Patient has 8 siblings. Lives alone, never married. Out of work since 64 years old.  Office Visits: 09/22/19 Dr [name] OV: dc'd HCTZ due to BP down to 118. Refilled gabapentin.   03/20/19 Dr [name] OV: chronic f/u; try Livalo for high ASCVD risk.   Consult Visit: 09/18/19 Dr Dwyane Dee (endcorine): DM dx 1998. Insulin since 2006. BG higher than expected based on A1c but fructosamine indicates improved control on Ozempic. Advised to take Novolog 25 min prior to meals. Increased Toujeo to 90 units.  07/10/19 Dr Dwyane Dee (endcorine): given new glucometer. Go back to Ozempic 0.25 mg weekly. Reduce Novolog to 25 units if BG is low-normal <100  07/02/19 Dr Linus Salmons (ID): HIV f/u, labs normal, no med changes. Advised counseling for depression but pt declined.  04/30/19 Dr Dwyane Dee (endocrine): start Ozempic 0.25 mg weekly, increase to 0.5 mg after 4 weeks.  Allergies  Allergen Reactions  . Benazepril Cough  . Molds & Smuts     Hard to breathe  . Pravastatin Other (See Comments)    myositis  . Glipizide Nausea Only    Medications: Outpatient Encounter Medications as of 11/20/2019  Medication Sig  . albuterol (PROVENTIL HFA;VENTOLIN HFA) 108 (90 Base) MCG/ACT inhaler Inhale  2 puffs into the lungs every 6 (six) hours as needed for wheezing or shortness of breath.  . Alcohol Swabs (B-D SINGLE USE SWABS REGULAR) PADS USE AS DIRECTED  . aspirin EC 81 MG tablet Take 81 mg by mouth every morning.  Marland Kitchen BEVESPI AEROSPHERE 9-4.8 MCG/ACT AERO INHALE 2 PUFFS INTO THE LUNGS TWO TIMES DAILY  . BYSTOLIC 20 MG TABS TAKE 1 TABLET EVERY DAY  . cetirizine (ZYRTEC) 10 MG tablet Take 10 mg by mouth daily.  . Cholecalciferol 2000 units TABS Take 1 tablet (2,000 Units total) by mouth daily.  . DROPLET PEN NEEDLES 31G X 8 MM MISC USE TO INJECT INSULIN  . DULoxetine (CYMBALTA) 60 MG capsule TAKE 1 CAPSULE EVERY DAY  . Eszopiclone 3 MG TABS TAKE 1 TABLET BY MOUTH IMMEDIATELY BEFORE BEDTIME  . furosemide (LASIX) 20 MG tablet TAKE 1 TABLET TWICE DAILY  . gabapentin (NEURONTIN) 300 MG capsule TAKE 1 CAPSULE THREE TIMES DAILY  . glucose blood (ACCU-CHEK GUIDE) test strip TEST BLOOD SUGAR FOUR TIMES DAILY  . insulin lispro (HUMALOG KWIKPEN) 100 UNIT/ML KwikPen Inject 25-30 Units into the skin 3 (three) times daily.  . Lancets MISC Use to test blood sugar tid. DX: E11.8  . linaclotide (LINZESS) 72 MCG capsule Take 1 capsule (72 mcg total) by mouth daily before breakfast.  . LIVALO 1 MG TABS TAKE 1 TABLET(1 MG) BY MOUTH DAILY  . metFORMIN (GLUCOPHAGE-XR) 500 MG 24 hr tablet TAKE 2 TABLETS TWICE DAILY  . NOVOLOG FLEXPEN 100 UNIT/ML FlexPen ADMINISTER 25 TO 30 UNITS UNDER THE SKIN THREE TIMES DAILY BEFORE  MEALS. MAY TAKE LESS IF SUGAR IS BELOW 120 OR EATING A SMALL MEAL  . ODEFSEY 200-25-25 MG TABS tablet TAKE 1 TABLET BY MOUTH EVERY DAY  . olmesartan (BENICAR) 40 MG tablet Take 1 tablet (40 mg total) by mouth daily.  . pioglitazone (ACTOS) 30 MG tablet TAKE 1 TABLET EVERY DAY  . Semaglutide,0.25 or 0.5MG/DOS, (OZEMPIC, 0.25 OR 0.5 MG/DOSE,) 2 MG/1.5ML SOPN Inject 0.5 mg into the skin once a week. Start with 0.94m weekly for 4 weeks  . TOUJEO MAX SOLOSTAR 300 UNIT/ML Solostar Pen ADMINISTER 86  UNITS UNDER THE SKIN DAILY  . traZODone (DESYREL) 150 MG tablet Take 3 tablets (450 mg total) by mouth at bedtime.  . trazodone (DESYREL) 300 MG tablet TAKE 1 TABLET AT BEDTIME  . triamcinolone cream (KENALOG) 0.5 % APPLY 1 APPLICATION TOPICALLY THREE TIME A DAY  . [DISCONTINUED] Alcohol Swabs (B-D SINGLE USE SWABS REGULAR) PADS USE AS DIRECTED  . [DISCONTINUED] Alcohol Swabs (B-D SINGLE USE SWABS REGULAR) PADS USE AS DIRECTED  . [DISCONTINUED] DULoxetine (CYMBALTA) 60 MG capsule TAKE 1 CAPSULE EVERY DAY  . [DISCONTINUED] DULoxetine (CYMBALTA) 60 MG capsule TAKE 1 CAPSULE EVERY DAY  . [DISCONTINUED] furosemide (LASIX) 20 MG tablet TAKE 1 TABLET TWICE DAILY  . [DISCONTINUED] metFORMIN (GLUCOPHAGE-XR) 500 MG 24 hr tablet TAKE 2 TABLETS BY MOUTH TWICE DAILY  . [DISCONTINUED] Nebivolol HCl (BYSTOLIC) 20 MG TABS Take 1 tablet (20 mg total) by mouth daily.  . [DISCONTINUED] olmesartan-hydrochlorothiazide (BENICAR HCT) 40-12.5 MG tablet TAKE 1 TABLET EVERY DAY  . [DISCONTINUED] pioglitazone (ACTOS) 30 MG tablet Take 1 tablet (30 mg total) by mouth daily.  . [DISCONTINUED] Pitavastatin Calcium (LIVALO) 1 MG TABS Take 1 tablet (1 mg total) by mouth daily.   No facility-administered encounter medications on file as of 11/20/2019.    Wt Readings from Last 3 Encounters:  09/22/19 189 lb (85.7 kg)  09/18/19 187 lb 3.2 oz (84.9 kg)  07/10/19 184 lb 3.2 oz (83.6 kg)    Current Diagnosis/Assessment:    Goals Addressed            This Visit's Progress   . Pharmacy Care Plan       CARE PLAN ENTRY (see longitudinal plan of care for additional care plan information)  Current Barriers:  . Chronic Disease Management support, education, and care coordination needs related to Hypertension, Hyperlipidemia, and Diabetes   Hypertension BP Readings from Last 3 Encounters:  09/22/19 118/66  09/18/19 140/72  07/10/19 (!) 148/76 .  Pharmacist Clinical Goal(s): o Over the next 30 days, patient will  work with PharmD and providers to maintain BP goal <130/80 . Current regimen:  o Bystolic 20 mg daily o Olmesartan 40 mg daily o Furosemide 20 mg twice a day . Interventions: o Discussed BP goals and benefits of medications for prevention of heart attack / stroke . Patient self care activities - Over the next 30 days, patient will: o Check BP as needed, document, and provide at future appointments o Ensure daily salt intake < 2300 mg/day  Hyperlipidemia Lab Results  Component Value Date/Time   LDLCALC 75 04/28/2019 11:26 AM   LDLCALC 90 03/18/2018 09:54 AM .  Pharmacist Clinical Goal(s): o Over the next 30 days, patient will work with PharmD and providers to maintain LDL goal < 100 . Current regimen:  o Livalo 1 mg daily o Aspirin 81 mg daily . Interventions: o Discussed cholesterol goals and benefits of medications for prevention of heart attack / stroke . Patient  self care activities - Over the next 30 days, patient will: o Continue current medications  Diabetes Lab Results  Component Value Date/Time   HGBA1C 7.0 (H) 07/07/2019 10:17 AM   HGBA1C 7.6 (H) 02/19/2019 10:31 AM .  Pharmacist Clinical Goal(s): o Over the next 30 days, patient will work with PharmD and providers to achieve A1c goal <7% . Current regimen:  o Toujeo Max 90 units daily o Novolog 25-30 units before meals o Metformin ER 500 mg - 2 tab twice a day o Pioglitazone 30 mg daily o Ozempic 0.25 mg weekly . Interventions: o Discussed blood sugar goals and benefits of medications for prevention of diabetic complications . Patient self care activities - Over the next 30 days, patient will: o Check blood sugar 3-4 times daily, document, and provide at future appointments o Contact provider with any episodes of hypoglycemia  Depression / Insomnia . Pharmacist Clinical Goal(s) o Over the next 30 days, patient will work with PharmD and providers to optimize therapy . Current regimen:  o Duloxetine 60 mg  daily o Trazodone 450 mg at bedtime o Eszoplicone 3 mg at bedtime . Interventions: o Discussed benefits and safety risks with medications . Patient self care activities - Over the next 30 days, patient will: o Continue medications as directed  Medication management . Pharmacist Clinical Goal(s): o Over the next 30 days, patient will work with PharmD and providers to maintain optimal medication adherence . Current pharmacy: [facility] . Interventions o Comprehensive medication review performed. o Continue current medication management strategy o Patient is switching insurance from [place] to [employer] - needs submission of tier exceptions for Branded products by January . Patient self care activities - Over the next 30 days, patient will: o Focus on medication adherence by fill date o Take medications as prescribed o Report any questions or concerns to PharmD and/or provider(s)  Please see past updates related to this goal by clicking on the "Past Updates" button in the selected goal        Hypertension   BP goal is:  <130/80  Office blood pressures are  BP Readings from Last 3 Encounters:  09/22/19 118/66  09/18/19 140/72  07/10/19 (!) 148/76   Lab Results  Component Value Date   CREATININE 1.00 09/11/2019   BUN 7 09/11/2019   GFR 91.66 09/11/2019   GFRNONAA >60 09/11/2016   GFRAA >60 09/11/2016   NA 140 09/11/2019   K 3.6 09/11/2019   CALCIUM 10.0 09/11/2019   CO2 30 09/11/2019   Patient checks BP at home infrequently Patient home BP readings are ranging: n/a  Patient has failed these meds in the past: HCTZ, atenolol, benazepril, irbesartan, losartan Patient is currently controlled on the following medications:  . Bystolic 20 mg daily . Olmesartan 40 mg daily . Furosemide 20 mg once daily   We discussed diet and exercise extensively; benefits of medications; pt denies side effects  Plan  Continue current medications   Hyperlipidemia   LDL goal <  100  Lipid Panel     Component Value Date/Time   CHOL 130 04/28/2019 1126   TRIG 99.0 04/28/2019 1126   HDL 35.40 (L) 04/28/2019 1126   LDLCALC 75 04/28/2019 1126   LDLCALC 90 03/18/2018 0954    Hepatic Function Latest Ref Rng & Units 03/18/2018 10/24/2016 09/11/2016  Total Protein 6.1 - 8.1 g/dL 7.6 8.4(H) 9.1(H)  Albumin 3.5 - 5.2 g/dL - 4.4 4.6  AST 10 - 35 U/L 15 31 37  ALT 9 - 46 U/L 13 26 40  Alk Phosphatase 39 - 117 U/L - 57 67  Total Bilirubin 0.2 - 1.2 mg/dL 0.3 0.5 0.6    The 10-year ASCVD risk score Mikey Bussing DC Jr., et al., 2013) is: 35%   Values used to calculate the score:     Age: 63 years     Sex: Male     Is Non-Hispanic African American: Yes     Diabetic: Yes     Tobacco smoker: Yes     Systolic Blood Pressure: 053 mmHg     Is BP treated: Yes     HDL Cholesterol: 35.4 mg/dL     Total Cholesterol: 130 mg/dL   Patient has failed these meds in past: pravastatin Patient is currently controlled on the following medications:  . Livalo 1 mg daily . Aspirin 81 mg daily  We discussed:  diet and exercise extensively ; Cholesterol goals; benefits of statin for ASCVD risk reduction  Plan  Continue current medications  Diabetes   A1c goal <7% Managed per Dr Dwyane Dee DM dx 1998. Insulin since 2006  Recent Relevant Labs: Lab Results  Component Value Date/Time   HGBA1C 7.0 (H) 07/07/2019 10:17 AM   HGBA1C 7.6 (H) 02/19/2019 10:31 AM   GFR 91.66 09/11/2019 08:12 AM   GFR 98.50 07/07/2019 10:17 AM   MICROALBUR 1.0 02/19/2019 10:31 AM   MICROALBUR 0.9 02/15/2018 10:39 AM    Last diabetic Eye exam:  Lab Results  Component Value Date/Time   HMDIABEYEEXA No Retinopathy 06/25/2019 12:00 AM    Last diabetic Foot exam:  Lab Results  Component Value Date/Time   HMDIABFOOTEX done 05/21/2013 12:00 AM    Checking BG: 3x per Day  Patient has failed these meds in past: n/a Patient is currently controlled on the following medications: . Toujeo Max 86 units  daily . Novolog 25-30 units TID before meals . Metformin ER 500 mg - 2 tab BID . Pioglitazone 30 mg daily . Ozempic 0.25 mg weekly . [employer] testing supplies  We discussed: how to recognize and treat signs of hypoglycemia; pt endorses adherence with medications; Discussed importance of maintaining sugars at goal to prevent complications of diabetes including kidney damage, retinal damage, and cardiovascular disease  Plan  Continue current medications and control with diet and exercise  Depression / Insomnia   Depression screen [facility] 2/9 10/27/2019 10/27/2019 07/02/2019  Decreased Interest 0 0 0  Down, Depressed, Hopeless _0 PHQ - 2 Score _1 Altered sleeping - - -  Tired, decreased energy - - -  Change in appetite - - -  Feeling bad or failure about yourself  - - -  Trouble concentrating - - -  Moving slowly or fidgety/restless - - -  Suicidal thoughts - - -  PHQ-9 Score - - -  Difficult doing work/chores - - -  Some recent data might be hidden   Patient has failed these meds in past: n/a Patient is currently controlled on the following medications:  . Duloxetine 60 mg daily . Trazodone 450 mg HS (300 mg + 150 mg) . Eszoplicone 3 mg HS  We discussed: Pt reports long history of depression after his HIV diagnosis in his late 67s. He recently feels better and has taken more ownership over his life and has more motivation. He used to have a lot of trouble sleeping, recently sleep has improved significantly  Plan  Continue current medications  Medication Management   Pt  uses [employer], [employer] for all medications Uses pill box? Yes Pt endorses 100% compliance  We discussed: Current pharmacy is preferred with insurance plan and patient is satisfied with pharmacy services  Plan  Continue current medication management strategy    Follow up: 1 month phone visit  Charlene Brooke, PharmD, BCACP Clinical Pharmacist [facility] at [facility] [phone]

## 2019-11-27 ENCOUNTER — Other Ambulatory Visit: Payer: Self-pay | Admitting: Internal Medicine

## 2019-11-27 DIAGNOSIS — F409 Phobic anxiety disorder, unspecified: Secondary | ICD-10-CM

## 2019-12-05 ENCOUNTER — Other Ambulatory Visit: Payer: Self-pay | Admitting: Endocrinology

## 2019-12-08 ENCOUNTER — Other Ambulatory Visit: Payer: Self-pay | Admitting: Internal Medicine

## 2019-12-08 DIAGNOSIS — Z794 Long term (current) use of insulin: Secondary | ICD-10-CM

## 2019-12-08 MED ORDER — DROPLET PEN NEEDLES 31G X 8 MM MISC: 1 refills | Status: DC

## 2019-12-09 ENCOUNTER — Telehealth: Payer: Self-pay | Admitting: Pharmacist

## 2019-12-09 NOTE — Progress Notes (Addendum)
   Chart Review for Chronic Care Management  Reviewed chart and insurance data for medication adherence. Patient does not have any gaps in adherence and is not in danger of failing any Medicare adherence measures. No further action required.   Wendy Poet, Clinical Pharmacist [facility]

## 2019-12-17 ENCOUNTER — Other Ambulatory Visit: Payer: Self-pay

## 2019-12-17 ENCOUNTER — Other Ambulatory Visit (INDEPENDENT_AMBULATORY_CARE_PROVIDER_SITE_OTHER): Payer: Medicare Other

## 2019-12-17 DIAGNOSIS — E1165 Type 2 diabetes mellitus with hyperglycemia: Secondary | ICD-10-CM

## 2019-12-17 DIAGNOSIS — Z794 Long term (current) use of insulin: Secondary | ICD-10-CM

## 2019-12-17 LAB — BASIC METABOLIC PANEL
BUN: 9 mg/dL (ref 6–23)
CO2: 33 mEq/L — ABNORMAL HIGH (ref 19–32)
Calcium: 9.3 mg/dL (ref 8.4–10.5)
Chloride: 104 mEq/L (ref 96–112)
Creatinine, Ser: 1.14 mg/dL (ref 0.40–1.50)
GFR: 69.12 mL/min (ref >60.00)
Glucose, Bld: 92 mg/dL (ref 70–99)
Potassium: 4.1 mEq/L (ref 3.5–5.1)
Sodium: 142 mEq/L (ref 135–145)

## 2019-12-17 LAB — HEMOGLOBIN A1C: Hgb A1c MFr Bld: 6.4 % (ref 4.6–6.5)

## 2019-12-19 ENCOUNTER — Ambulatory Visit: Payer: Medicare Other | Admitting: Pharmacist

## 2019-12-19 ENCOUNTER — Other Ambulatory Visit: Payer: Self-pay

## 2019-12-19 NOTE — Patient Instructions (Addendum)
Visit Information  Phone number for Pharmacist: [phone]  We fill submit tier exceptions during the last week of December for 2022.  The patient verbalized understanding of instructions, educational materials, and care plan provided today and declined offer to receive copy of patient instructions, educational materials, and care plan.  Telephone follow up appointment with pharmacy team member scheduled for: 2 months  Charlene Brooke, PharmD, [facility] Clinical Pharmacist [facility] at [facility] [phone]

## 2019-12-19 NOTE — Chronic Care Management (AMB) (Signed)
Patient has requested tier exceptions for branded medications for 2022. He has switched from [facility] to [employer] and is switching to [employer] instead of mail order.  [place] to request tier exceptions for: [place], [place], [place], [place], [place], [place], [place]  However per representative they cannot process requests for 2022 until the last week of December. Will contact them again then.  Informed patient of the above, he voiced understanding and is appreciative.   Charlene Brooke, PharmD, BCACP Clinical Pharmacist [facility] at [facility] [phone]

## 2019-12-23 ENCOUNTER — Encounter: Payer: Self-pay | Admitting: Podiatry

## 2019-12-23 ENCOUNTER — Ambulatory Visit (INDEPENDENT_AMBULATORY_CARE_PROVIDER_SITE_OTHER): Payer: Medicare Other

## 2019-12-23 ENCOUNTER — Ambulatory Visit (INDEPENDENT_AMBULATORY_CARE_PROVIDER_SITE_OTHER): Payer: Medicare Other | Admitting: Podiatry

## 2019-12-23 ENCOUNTER — Other Ambulatory Visit: Payer: Self-pay

## 2019-12-23 DIAGNOSIS — M79674 Pain in right toe(s): Secondary | ICD-10-CM

## 2019-12-23 DIAGNOSIS — B351 Tinea unguium: Secondary | ICD-10-CM | POA: Diagnosis not present

## 2019-12-23 DIAGNOSIS — L97519 Non-pressure chronic ulcer of other part of right foot with unspecified severity: Secondary | ICD-10-CM

## 2019-12-23 DIAGNOSIS — M2041 Other hammer toe(s) (acquired), right foot: Secondary | ICD-10-CM

## 2019-12-23 DIAGNOSIS — E11621 Type 2 diabetes mellitus with foot ulcer: Secondary | ICD-10-CM

## 2019-12-23 DIAGNOSIS — E1140 Type 2 diabetes mellitus with diabetic neuropathy, unspecified: Secondary | ICD-10-CM

## 2019-12-23 DIAGNOSIS — M2042 Other hammer toe(s) (acquired), left foot: Secondary | ICD-10-CM | POA: Diagnosis not present

## 2019-12-23 DIAGNOSIS — Z794 Long term (current) use of insulin: Secondary | ICD-10-CM

## 2019-12-23 DIAGNOSIS — M79675 Pain in left toe(s): Secondary | ICD-10-CM | POA: Diagnosis not present

## 2019-12-23 DIAGNOSIS — B079 Viral wart, unspecified: Secondary | ICD-10-CM | POA: Diagnosis not present

## 2019-12-23 NOTE — Progress Notes (Signed)
Subjective: Patient presents today with diabetes and cc of painful, discolored, thick toenails which interfere with daily activities. Pain is aggravated when wearing enclosed shoe gear. Pain is getting progressively worse and relieved with periodic professional debridement.  New concern today: Wound right hallux. Patient states area opened up in October. He did not call to schedule an appointment and states he has been keeping it clean with soap and water. He denies any pus or odor from the wound. He denies any fever, chills, night sweats, nausea or vomiting.  Nicholas Lima, MD is patient's PCP. Last visit was 09/22/2019. He is also followed by Dr. Dwyane Dee for his diabetes. Last visit with Dr. Dwyane Dee was 09/18/2019.  He states his blood glucose was 124 mg/dl this morning.  Current Outpatient Medications on File Prior to Visit  Medication Sig Dispense Refill   albuterol (PROVENTIL HFA;VENTOLIN HFA) 108 (90 Base) MCG/ACT inhaler Inhale 2 puffs into the lungs every 6 (six) hours as needed for wheezing or shortness of breath. 1 Inhaler 5   Alcohol Swabs (B-D SINGLE USE SWABS REGULAR) PADS USE AS DIRECTED 300 each 1   aspirin EC 81 MG tablet Take 81 mg by mouth every morning.     BEVESPI AEROSPHERE 9-4.8 MCG/ACT AERO INHALE 2 PUFFS INTO THE LUNGS TWO TIMES DAILY 67.5 g 1   BYSTOLIC 20 MG TABS TAKE 1 TABLET EVERY DAY 90 tablet 1   cetirizine (ZYRTEC) 10 MG tablet Take 10 mg by mouth daily.     Cholecalciferol 2000 units TABS Take 1 tablet (2,000 Units total) by mouth daily. 90 tablet 1   DULoxetine (CYMBALTA) 60 MG capsule TAKE 1 CAPSULE EVERY DAY 90 capsule 1   Eszopiclone 3 MG TABS TAKE 1 TABLET BY MOUTH IMMEDIATELY BEFORE BEDTIME 90 tablet 1   furosemide (LASIX) 20 MG tablet TAKE 1 TABLET TWICE DAILY 180 tablet 1   gabapentin (NEURONTIN) 300 MG capsule TAKE 1 CAPSULE THREE TIMES DAILY 270 capsule 1   glucose blood (ACCU-CHEK GUIDE) test strip TEST BLOOD SUGAR FOUR TIMES DAILY 400 strip  3   insulin lispro (HUMALOG KWIKPEN) 100 UNIT/ML KwikPen Inject 25-30 Units into the skin 3 (three) times daily. 30 mL 3   Insulin Pen Needle (DROPLET PEN NEEDLES) 31G X 8 MM MISC USE TO INJECT INSULIN 500 each 1   Lancets MISC Use to test blood sugar tid. DX: E11.8 300 each 3   linaclotide (LINZESS) 72 MCG capsule Take 1 capsule (72 mcg total) by mouth daily before breakfast. 90 capsule 1   LIVALO 1 MG TABS TAKE 1 TABLET(1 MG) BY MOUTH DAILY 90 tablet 1   metFORMIN (GLUCOPHAGE-XR) 500 MG 24 hr tablet TAKE 2 TABLETS TWICE DAILY 360 tablet 1   NOVOLOG FLEXPEN 100 UNIT/ML FlexPen ADMINISTER 25 TO 30 UNITS UNDER THE SKIN THREE TIMES DAILY BEFORE MEALS. MAY TAKE LESS IF SUGAR IS BELOW 120 OR EATING A SMALL MEAL 30 mL 1   ODEFSEY 200-25-25 MG TABS tablet TAKE 1 TABLET BY MOUTH EVERY DAY 30 tablet 4   olmesartan (BENICAR) 40 MG tablet Take 1 tablet (40 mg total) by mouth daily. 90 tablet 1   OZEMPIC, 0.25 OR 0.5 MG/DOSE, 2 MG/1.5ML SOPN START WITH INJECTING 0.25 MG UNDER THE SKIN WEEKLY FOR 4 WEEKS, THEN INJECT 0.5 MG UNDER THE SKIN ONCE A WEEK 1.5 mL 0   pioglitazone (ACTOS) 30 MG tablet TAKE 1 TABLET EVERY DAY 90 tablet 1   TOUJEO MAX SOLOSTAR 300 UNIT/ML Solostar Pen ADMINISTER 86 UNITS  UNDER THE SKIN DAILY 6 mL 3   traZODone (DESYREL) 150 MG tablet Take 3 tablets (450 mg total) by mouth at bedtime. 270 tablet 1   trazodone (DESYREL) 300 MG tablet TAKE 1 TABLET AT BEDTIME 90 tablet 1   triamcinolone cream (KENALOG) 0.5 % APPLY 1 APPLICATION TOPICALLY THREE TIME A DAY 100 g 3   [DISCONTINUED] Alcohol Swabs (B-D SINGLE USE SWABS REGULAR) PADS USE AS DIRECTED 300 each 1   [DISCONTINUED] Alcohol Swabs (B-D SINGLE USE SWABS REGULAR) PADS USE AS DIRECTED 300 each 1   [DISCONTINUED] DULoxetine (CYMBALTA) 60 MG capsule TAKE 1 CAPSULE EVERY DAY 90 capsule 1   [DISCONTINUED] DULoxetine (CYMBALTA) 60 MG capsule TAKE 1 CAPSULE EVERY DAY 90 capsule 1   [DISCONTINUED] furosemide (LASIX) 20 MG  tablet TAKE 1 TABLET TWICE DAILY 180 tablet 1   [DISCONTINUED] metFORMIN (GLUCOPHAGE-XR) 500 MG 24 hr tablet TAKE 2 TABLETS BY MOUTH TWICE DAILY 360 tablet 1   [DISCONTINUED] Nebivolol HCl (BYSTOLIC) 20 MG TABS Take 1 tablet (20 mg total) by mouth daily. 90 tablet 1   [DISCONTINUED] olmesartan-hydrochlorothiazide (BENICAR HCT) 40-12.5 MG tablet TAKE 1 TABLET EVERY DAY 90 tablet 1   [DISCONTINUED] pioglitazone (ACTOS) 30 MG tablet Take 1 tablet (30 mg total) by mouth daily. 90 tablet 1   [DISCONTINUED] Pitavastatin Calcium (LIVALO) 1 MG TABS Take 1 tablet (1 mg total) by mouth daily. 90 tablet 1   No current facility-administered medications on file prior to visit.     Allergies  Allergen Reactions   Benazepril Cough   Molds & Smuts     Hard to breathe   Pravastatin Other (See Comments)    myositis   Glipizide Nausea Only     Objective: There were no vitals filed for this visit.  Nicholas Whitehead is a pleasant 64 y.o. male in NAD.Marland Kitchen AAO X 3.  Vascular Examination: Capillary fill time to digits <3 seconds b/l lower extremities. Faintly palpable DP pulse(s) b/l lower extremities. Nonpalpable PT pulse(s) b/l lower extremities. Pedal hair absent. Lower extremity skin temperature gradient within normal limits. No edema noted b/l lower extremities. No ischemia or gangrene noted b/l lower extremities.  Dermatological Examination: Pedal skin with normal turgor, texture and tone bilaterally. No open wounds bilaterally. No interdigital macerations bilaterally. Toenails 1-5 b/l elongated, discolored, dystrophic, thickened, crumbly with subungual debris and tenderness to dorsal palpation. Hyperkeratotic lesion(s) plantar aspect b/l heel pads.  No erythema, no edema, no drainage, no fluctuance.    Wound Location: R hallux There is a no amount of devitalized tissue present in the wound. Predebridement Wound Measurement:  1.0  x 1.0 x 0.2 cm. Wound Base: Granular/Healthy Peri-wound:  Normal Exudate: None: wound tissue dry Blood Loss during debridement: 0 cc('s). Material in wound which inhibits healing/promotes adjacent tissue breakdown: None Description of tissue removed from ulceration today: No debridement performed today. Sign(s) of clinical bacterial infection: no clinical signs of infection noted on examination today.   Musculoskeletal Examination: Normal muscle strength 5/5 to all lower extremity muscle groups bilaterally. No pain crepitus or joint limitation noted with ROM b/l. Hammertoes noted to the 1-5 bilaterally.  Neurological Examination: Pt has subjective symptoms of neuropathy. Protective sensation decreased with 10 gram monofilament b/l. Vibratory sensation intact b/l.  ABIs performed October 16, 2019: Triphasic waveforms b/l Toe pressures normal ABI right LE: 1.24 ABI left LE: 1.15  Xray findings right foot: No gas in tissues right foot. No evidence of fracture right foot. No bone erosion noted at location of  ulceration R hallux.  Hallux hammertoe right foot Normal bone mineralization  Assessment and Plan 1. Pain due to onychomycosis of toenails of both feet 2. Diabetic ulcer of right great toe ([place]) - DG Foot Complete Right; Future 3. Acquired hammertoes of both feet 4. Type 2 diabetes mellitus with diabetic neuropathy, with long-term current use of insulin (HCC) - DG Foot Complete Right; Future  Plan: -Examined patient. -Patient was evaluated and treated and all questions answered.  -Patient/POA/Family member educated on diagnosis and treatment plan of routine ulcer debridement/wound care.  -Ulceration debridement not performed today. -Today's ulcer size:: 1.0 x 1.0 x 0.2 cm. -Ulceration cleansed with wound cleanser. Medihoney Gel applied to base of ulceration and secured with light dressing. -Wound responded well to today's treatment. -Patient risk factors affecting healing of ulcer: diabetes, diabetic neuropathy, foot  deformity. -Dispensed Darco shoe. -Debbe Bales given written instructions on daily wound care for R hallux ulceration. -Medicaid ABN signed for this year. Patient consents for services of Darco shoe today. Copy given to patient on today's visit and copy placed in patient chart. -Toenails 1-5 b/l were debrided in length and girth with sterile nail nippers and dremel without iatrogenic bleeding.  -Patient to report any pedal injuries to medical professional immediately. -Xray of right foot was performed and reviewed with patient and/or POA. -Patient/POA to call should there be question/concern in the interim. -Patient to follow up in one week with Dr. Lanae Crumbly for management of ulcer right hallux.  Return in about 9 weeks (around 02/24/2020).  Marzetta Board, DPM

## 2019-12-23 NOTE — Patient Instructions (Addendum)
DRESSING CHANGES R hallux:   PHARMACY SHOPPING LIST: 1. Saline or Wound Cleanser for cleaning wound 2. 2 x 2 inch sterile gauze for cleaning wound 3. Fabric band-aid  A. IF DISPENSED, WEAR SURGICAL SHOE OR WALKING BOOT AT ALL TIMES.  B. IF PRESCRIBED ORAL ANTIBIOTICS, TAKE ALL MEDICATION AS PRESCRIBED UNTIL ALL ARE GONE.  C. IF DOCTOR HAS DESIGNATED NONWEIGHTBEARING STATUS, PLEASE ADHERE TO INSTRUCTIONS.   1. KEEP right foot DRY AT ALL TIMES!!!!  2. CLEANSE ULCER WITH SALINE OR WOUND CLEANSER.  3. DAB DRY WITH GAUZE SPONGE.  4. APPLY A LIGHT AMOUNT OF Medihoney Gel TO BASE OF ULCER.  5. APPLY OUTER DRESSING AS INSTRUCTED.  6. WEAR SURGICAL SHOE/BOOT DAILY AT ALL TIMES. IF SUPPLIED, WEAR HEEL PROTECTORS AT ALL TIMES WHEN IN BED.  7. DO NOT WALK BAREFOOT!!!  8.  IF YOU EXPERIENCE ANY FEVER, CHILLS, NIGHTSWEATS, NAUSEA OR VOMITING, ELEVATED OR LOW BLOOD SUGARS, REPORT TO EMERGENCY ROOM.  9. IF YOU EXPERIENCE INCREASED REDNESS, PAIN, SWELLING, DISCOLORATION, ODOR, PUS, DRAINAGE OR WARMTH OF YOUR FOOT, REPORT TO EMERGENCY ROOM.

## 2019-12-24 ENCOUNTER — Ambulatory Visit (INDEPENDENT_AMBULATORY_CARE_PROVIDER_SITE_OTHER): Payer: Medicare Other | Admitting: Endocrinology

## 2019-12-24 ENCOUNTER — Encounter: Payer: Self-pay | Admitting: Endocrinology

## 2019-12-24 VITALS — BP 136/86 | HR 58 | Ht 73.0 in | Wt 199.4 lb

## 2019-12-24 DIAGNOSIS — Z794 Long term (current) use of insulin: Secondary | ICD-10-CM | POA: Diagnosis not present

## 2019-12-24 DIAGNOSIS — E1165 Type 2 diabetes mellitus with hyperglycemia: Secondary | ICD-10-CM

## 2019-12-24 DIAGNOSIS — E1140 Type 2 diabetes mellitus with diabetic neuropathy, unspecified: Secondary | ICD-10-CM

## 2019-12-24 DIAGNOSIS — Z23 Encounter for immunization: Secondary | ICD-10-CM

## 2019-12-24 NOTE — Progress Notes (Signed)
Patient ID: Nicholas Whitehead, male   DOB: [birth year], 64 y.o.   MRN: [id number]           Patient ID: Nicholas Whitehead, male   DOB: [birth year], 64 y.o.   MRN: [id number]   Reason for Appointment: Type II Diabetes follow-up   History of Present Illness   Diagnosis date: 1998    Previous history: He was started on oral agents at diagnosis and around 2006 was put on insulin His blood sugars are usually well controlled when he takes Actos along with his insulin. He had been on Invokana since 04/2013  He requires large doses of insulin especially basal insulin   Recent history:   Insulin regimen: Toujeo 86 units p.m.;  Novolog 30 bid tid         Oral hypoglycemic drugs: Metformin ER 1000 mg twice a day, Actos 30 mg, Ozempic 0.25 weekly  A1c is down to 6.4 and improving   Previous fructosamine 298  Current management, blood sugar patterns and problems:  He is still tolerating the 0.25 mg Ozempic dose, possibly had nausea and diarrhea with the higher dose  Also previously had a poor appetite but this is improving significantly  With improved appetite his weight has gone up 11 pounds  However he appears to be planning his meals better and not doing excessive snacks  With this his blood sugars this month are significantly better  He did not increase his TOUJEO and still taking 86 units but his fasting readings are improving  Fasting readings in the last couple of weeks are in the low 100 range  No hypoglycemia reported also  However he does not adjust his mealtime dose based on what he is eating  He has been trying to do some walking but not a lot and recently having foot ulcer     Side effects from medications:  urgency, frequent urination from [place] blood glucose:  2-3 + times a day  Glucometer:   Accu-Chek  Blood sugar readings for 2 weeks and averages for 30 days   PRE-MEAL  mornings Lunch Dinner Bedtime Overall  Glucose range: 97-167  110-55  79   140-186   Mean/median:  140    160 148   Previous readings:  PRE-MEAL Fasting Lunch Dinner  overnight  Overall  Glucose range:  132-215  160  196, 200   Mean/median:  168    163   POST-MEAL PC Breakfast PC Lunch PC Dinner  Glucose range:    113  Mean/median:              Meals: Breakfast variable, 9-10 AM Lunch 2-3pm  usually avoiding high-fat meals, dinner at about 6 pm          Dietician visit: Most recent: 2013    Weight control:  Wt Readings from Last 3 Encounters:  12/24/19 199 lb 6.4 oz (90.4 kg)  09/22/19 189 lb (85.7 kg)  09/18/19 187 lb 3.2 oz (84.9 kg)          Diabetes labs:  Lab Results  Component Value Date   HGBA1C 6.4 12/17/2019   HGBA1C 7.0 (H) 07/07/2019   HGBA1C 7.6 (H) 02/19/2019   Lab Results  Component Value Date   MICROALBUR 1.0 02/19/2019   LDLCALC 75 04/28/2019   CREATININE 1.14 12/17/2019   Lab Results  Component Value Date   FRUCTOSAMINE 280 09/11/2019   FRUCTOSAMINE 298 (H) 04/28/2019   FRUCTOSAMINE 276 09/13/2018  FRUCTOSAMINE 295 (H) 07/09/2018   FRUCTOSAMINE 309 (H) 02/13/2018    Other active problems discussed today: See review of systems   No visits with results within 1 Week(s) from this visit.  Latest known visit with results is:  Lab on 12/17/2019  Component Date Value Ref Range Status  . Sodium 12/17/2019 142  135 - 145 mEq/L Final  . Potassium 12/17/2019 4.1  3.5 - 5.1 mEq/L Final  . Chloride 12/17/2019 104  96 - 112 mEq/L Final  . CO2 12/17/2019 33* 19 - 32 mEq/L Final  . Glucose, Bld 12/17/2019 92  70 - 99 mg/dL Final  . BUN 12/17/2019 9  6 - 23 mg/dL Final  . Creatinine, Ser 12/17/2019 1.14  0.40 - 1.50 mg/dL Final  . GFR 12/17/2019 69.12  >60.00 mL/min Final   Calculated using the CKD-EPI Creatinine Equation (2021)  . Calcium 12/17/2019 9.3  8.4 - 10.5 mg/dL Final  . Hgb A1c MFr Bld 12/17/2019 6.4  4.6 - 6.5 % Final   Glycemic Control Guidelines for People with Diabetes:Non Diabetic:  <6%Goal of Therapy:  <7%Additional Action Suggested:  >8%      Allergies as of 12/24/2019      Reactions   Benazepril Cough   Molds & Smuts    Hard to breathe   Pravastatin Other (See Comments)   myositis   Glipizide Nausea Only      Medication List       Accurate as of December 24, 2019 10:22 AM. If you have any questions, ask your nurse or doctor.        Accu-Chek Guide test strip Generic drug: glucose blood TEST BLOOD SUGAR FOUR TIMES DAILY   albuterol 108 (90 Base) MCG/ACT inhaler Commonly known as: VENTOLIN HFA Inhale 2 puffs into the lungs every 6 (six) hours as needed for wheezing or shortness of breath.   aspirin EC 81 MG tablet Take 81 mg by mouth every morning.   B-D SINGLE USE SWABS REGULAR Pads USE AS DIRECTED   Bevespi Aerosphere 9-4.8 MCG/ACT Aero Generic drug: Glycopyrrolate-Formoterol INHALE 2 PUFFS INTO THE LUNGS TWO TIMES DAILY   Bystolic 20 MG Tabs Generic drug: Nebivolol HCl TAKE 1 TABLET EVERY DAY   cetirizine 10 MG tablet Commonly known as: ZYRTEC Take 10 mg by mouth daily.   Cholecalciferol 50 MCG (2000 UT) Tabs Take 1 tablet (2,000 Units total) by mouth daily.   Droplet Pen Needles 31G X 8 MM Misc Generic drug: Insulin Pen Needle USE TO INJECT INSULIN   DULoxetine 60 MG capsule Commonly known as: CYMBALTA TAKE 1 CAPSULE EVERY DAY   Eszopiclone 3 MG Tabs TAKE 1 TABLET BY MOUTH IMMEDIATELY BEFORE BEDTIME   furosemide 20 MG tablet Commonly known as: LASIX TAKE 1 TABLET TWICE DAILY   gabapentin 300 MG capsule Commonly known as: NEURONTIN TAKE 1 CAPSULE THREE TIMES DAILY   insulin lispro 100 UNIT/ML KwikPen Commonly known as: HumaLOG KwikPen Inject 25-30 Units into the skin 3 (three) times daily.   Lancets Misc Use to test blood sugar tid. DX: E11.8   linaclotide 72 MCG capsule Commonly known as: Linzess Take 1 capsule (72 mcg total) by mouth daily before breakfast.   Livalo 1 MG Tabs Generic drug: Pitavastatin Calcium TAKE 1 TABLET(1  MG) BY MOUTH DAILY   metFORMIN 500 MG 24 hr tablet Commonly known as: GLUCOPHAGE-XR TAKE 2 TABLETS TWICE DAILY   NovoLOG FlexPen 100 UNIT/ML FlexPen Generic drug: insulin aspart ADMINISTER 25 TO 30 UNITS UNDER THE SKIN  THREE TIMES DAILY BEFORE MEALS. MAY TAKE LESS IF SUGAR IS BELOW 120 OR EATING A SMALL MEAL   Odefsey 200-25-25 MG Tabs tablet Generic drug: emtricitabine-rilpivir-tenofovir AF TAKE 1 TABLET BY MOUTH EVERY DAY   olmesartan 40 MG tablet Commonly known as: BENICAR Take 1 tablet (40 mg total) by mouth daily.   Ozempic (0.25 or 0.5 MG/DOSE) 2 MG/1.5ML Sopn Generic drug: Semaglutide(0.25 or 0.5MG /DOS) START WITH INJECTING 0.25 MG UNDER THE SKIN WEEKLY FOR 4 WEEKS, THEN INJECT 0.5 MG UNDER THE SKIN ONCE A WEEK   pioglitazone 30 MG tablet Commonly known as: ACTOS TAKE 1 TABLET EVERY DAY   Toujeo Max SoloStar 300 UNIT/ML Solostar Pen Generic drug: insulin glargine (2 Unit Dial) ADMINISTER 86 UNITS UNDER THE SKIN DAILY   traZODone 150 MG tablet Commonly known as: DESYREL Take 3 tablets (450 mg total) by mouth at bedtime.   trazodone 300 MG tablet Commonly known as: DESYREL TAKE 1 TABLET AT BEDTIME   triamcinolone cream 0.5 % Commonly known as: KENALOG APPLY 1 APPLICATION TOPICALLY THREE TIME A DAY       Allergies:  Allergies  Allergen Reactions  . Benazepril Cough  . Molds & Smuts     Hard to breathe  . Pravastatin Other (See Comments)    myositis  . Glipizide Nausea Only    Past Medical History:  Diagnosis Date  . Anxiety   . Cataract    right   . Depression   . Diabetes mellitus without complication ([place])   . DM (diabetes mellitus screen)   . HIV disease ([place]) 09/12/1995  . HIV infection ([place])   . Hypercholesterolemia   . Hyperlipidemia   . Hypertension     Past Surgical History:  Procedure Laterality Date  . COLONOSCOPY WITH PROPOFOL  01/16/2012   Procedure: COLONOSCOPY WITH PROPOFOL;  Surgeon: Garlan Fair, MD;  Location: [facility]  ENDOSCOPY;  Service: Endoscopy;  Laterality: N/A;  . EYE SURGERY  2005   catarct surgery  . optic lens surgery  2005    Family History  Problem Relation Age of Onset  . Arthritis Mother   . Diabetes Mother   . Heart disease Mother   . Stroke Mother   . Arthritis Father   . Diabetes Father   . Heart disease Father     Social History:  reports that he has been smoking cigarettes. He has been smoking about 0.25 packs per day. He has never used smokeless tobacco. He reports that he does not drink alcohol and does not use drugs.  Review of Systems:  Hypertension:  on Bystolic  and on Benicar HCT 1/2, previously on losartan, followed by PCP Also on Lasix as needed  BP Readings from Last 3 Encounters:  12/24/19 136/86  09/22/19 118/66  09/18/19 140/72   RENAL function: fairly consistently normal  Lab Results  Component Value Date   CREATININE 1.14 12/17/2019   CREATININE 1.00 09/11/2019   CREATININE 0.94 07/07/2019    Lipids: LDL is normal as follows Treatment prescribed by PCP, currently on Livalo   Lab Results  Component Value Date   CHOL 130 04/28/2019   HDL 35.40 (L) 04/28/2019   LDLCALC 75 04/28/2019   TRIG 99.0 04/28/2019   CHOLHDL 4 04/28/2019    Neuropathy: Has had paresthesiae in feet and legs, these are treated with gabapentin 300 mg 3 times a day   Forms for the diabetic shoes were sent in July 21 to the podiatrist. Has sensory loss on exam  LABS:  No visits with results within 1 Week(s) from this visit.  Latest known visit with results is:  Lab on 12/17/2019  Component Date Value Ref Range Status  . Sodium 12/17/2019 142  135 - 145 mEq/L Final  . Potassium 12/17/2019 4.1  3.5 - 5.1 mEq/L Final  . Chloride 12/17/2019 104  96 - 112 mEq/L Final  . CO2 12/17/2019 33* 19 - 32 mEq/L Final  . Glucose, Bld 12/17/2019 92  70 - 99 mg/dL Final  . BUN 12/17/2019 9  6 - 23 mg/dL Final  . Creatinine, Ser 12/17/2019 1.14  0.40 - 1.50 mg/dL Final  . GFR  12/17/2019 69.12  >60.00 mL/min Final   Calculated using the CKD-EPI Creatinine Equation (2021)  . Calcium 12/17/2019 9.3  8.4 - 10.5 mg/dL Final  . Hgb A1c MFr Bld 12/17/2019 6.4  4.6 - 6.5 % Final   Glycemic Control Guidelines for People with Diabetes:Non Diabetic:  <6%Goal of Therapy: <7%Additional Action Suggested:  >8%      Examination:   BP 136/86   Pulse (!) 58   Ht 6\' 1"  (1.854 m)   Wt 199 lb 6.4 oz (90.4 kg)   SpO2 96%   BMI 26.31 kg/m   Body mass index is 26.31 kg/m.     ASSESSMENT/ PLAN:    Diabetes type 2 on insulin:   See history of present illness for detailed discussion of his current management, blood sugar patterns and problems identified  His A1c is 6.4    He is on basal bolus insulin, Metformin and Actos  Although he is requiring high doses of insulin for his weight his level of control generally depends on his compliance with diet and stress level  Recently with better diet and reduce depression he appears to have been improving blood sugar pattern throughout the day Also likely benefiting from Ozempic 0.25 mg His appetite has improved and he is gaining weight  His blood sugar monitoring is somewhat sporadic and mostly in the mornings and late evening   Recommendations: He can improve his blood sugar monitoring with the help of the freestyle libre Discussed in detail how this works and information it will provide including alerts He was given a brochure and phone numbers for ordering the supplies In the meantime we will need to have rotate when he checks his blood sugars at different times of the day He can adjust his NovoLog up or down 5 units based on his mealtime No change in Toujeo Consider increasing Ozempic if his weight continues to go up  HYPERTENSION: Blood pressure is relatively high and he will follow up with his PCP  Neuropathy with recent foot ulcer: He will be following up with the foot doctor and wound clinic  Recommended  influenza vaccine and he agrees to take this today  There are no Patient Instructions on file for this visit.       Elayne Snare 12/24/2019, 10:22 AM           ..

## 2020-01-01 ENCOUNTER — Ambulatory Visit (INDEPENDENT_AMBULATORY_CARE_PROVIDER_SITE_OTHER): Payer: Medicare Other | Admitting: Podiatry

## 2020-01-01 ENCOUNTER — Other Ambulatory Visit: Payer: Self-pay

## 2020-01-01 ENCOUNTER — Telehealth: Payer: Self-pay | Admitting: Podiatry

## 2020-01-01 DIAGNOSIS — M2031 Hallux varus (acquired), right foot: Secondary | ICD-10-CM | POA: Diagnosis not present

## 2020-01-01 DIAGNOSIS — L97514 Non-pressure chronic ulcer of other part of right foot with necrosis of bone: Secondary | ICD-10-CM

## 2020-01-01 DIAGNOSIS — M869 Osteomyelitis, unspecified: Secondary | ICD-10-CM | POA: Diagnosis not present

## 2020-01-01 MED ORDER — DOXYCYCLINE HYCLATE 100 MG PO TABS: 100.0000 mg | ORAL_TABLET | Freq: Two times a day (BID) | ORAL | 0 refills | Status: DC

## 2020-01-01 NOTE — Addendum Note (Signed)
Addended bySherryle Lis, Juliocesar Blasius R on: 01/01/2020 02:22 PM   Modules accepted: Orders

## 2020-01-01 NOTE — Telephone Encounter (Signed)
Patient requested prescription for antibiotics and stated he was told he would be given prescription but wasn't available at the pharmacy yet, Please advise

## 2020-01-01 NOTE — Progress Notes (Signed)
  Subjective:  Patient ID: Nicholas Whitehead, male    DOB: 1955/03/18,  MRN: [id number]  Chief Complaint  Patient presents with  . Foot Ulcer    Right hallux. Pt stated that he is doing okay and he has no concerns at this time.    64 y.o. male presents with the above complaint. History confirmed with patient.  He is referred to me by Dr. Elisha Ponder for a neuropathic diabetic ulceration of the right hallux.  This is caused also by the deformity in his toe that she says.  He has a history of type 2 diabetes, his A1c is 6.4%.  He also has HIV which is well controlled and nondetectable.  Occasional smoker  Objective:  Physical Exam: warm, good capillary refill, no trophic changes or ulcerative lesions and normal DP and PT pulses.  Loss of protective sensation right lower extremity  Full-thickness ulceration measuring approximately 5 mm in diameter which probes directly to bone with surrounding hyperkeratotic tissue over the head of the proximal phalanx of the right hallux  Previous radiographs reviewed there are bony erosions in the head of the proximal phalanx of the hallux with calcification of the dorsal soft tissues Assessment:   1. Hallux malleus of right foot   2. 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   Discussed with him that his wound and clinical exam as well as the deformity is concerning for osteomyelitis of the proximal phalanx.  I do not think his ulceration will heal with wound care alone.  The deformity must be addressed.  I think he likely will need a flexor tenotomy versus transfer as well as a arthroplasty of the IPJ of the hallux.  I would like to order an MRI to evaluate for the presence of osteomyelitis prior to any surgical intervention.  This is ordered for [facility] imaging.  He will return in 3 weeks for further wound care following his MRI to review and plans for surgical  intervention.  In the interim continue local wound care with Medihoney and adhesive bandages.  Return in about 3 weeks (around 01/22/2020) for wound re-check, review MRI, plan surgery.

## 2020-01-01 NOTE — Telephone Encounter (Signed)
Sent to pharmacy now 

## 2020-01-05 ENCOUNTER — Other Ambulatory Visit: Payer: Self-pay | Admitting: Internal Medicine

## 2020-01-05 ENCOUNTER — Telehealth: Payer: Self-pay | Admitting: Endocrinology

## 2020-01-05 ENCOUNTER — Telehealth: Payer: Self-pay | Admitting: Internal Medicine

## 2020-01-05 ENCOUNTER — Other Ambulatory Visit: Payer: Self-pay | Admitting: *Deleted

## 2020-01-05 DIAGNOSIS — F419 Anxiety disorder, unspecified: Secondary | ICD-10-CM

## 2020-01-05 MED ORDER — DULOXETINE HCL 60 MG PO CPEP: 60.0000 mg | ORAL_CAPSULE | Freq: Every day | ORAL | 1 refills | Status: DC

## 2020-01-05 MED ORDER — TOUJEO MAX SOLOSTAR 300 UNIT/ML ~~LOC~~ SOPN: PEN_INJECTOR | SUBCUTANEOUS | 3 refills | Status: DC

## 2020-01-05 NOTE — Telephone Encounter (Signed)
1.Medication Requested: DULoxetine (CYMBALTA) 60 MG capsule 2. Pharmacy (Name, Street, [facility]): [facility] - [place], [place] - [address] Phone:  [phone]  Fax:  [phone]     3. On Med List: yes 4. Last Visit with PCP: 09.13.21 5. Next visit date with PCP: 01.10.22  Agent: Please be advised that RX refills may take up to 3 business days. We ask that you follow-up with your pharmacy.

## 2020-01-05 NOTE — Telephone Encounter (Signed)
Rx sent 

## 2020-01-05 NOTE — Telephone Encounter (Signed)
Patient called and is requesting a refill for TOUJEO MAX SOLOSTAR 300 UNIT/ML Solostar Pen sent [facility] on [address]

## 2020-01-07 ENCOUNTER — Telehealth: Payer: Self-pay | Admitting: Podiatry

## 2020-01-07 NOTE — Telephone Encounter (Signed)
Patient left a message on the nurse line asking if he could take a bath or a shower. He said it is healing nicely.

## 2020-01-08 ENCOUNTER — Telehealth: Payer: Self-pay | Admitting: Internal Medicine

## 2020-01-08 NOTE — Telephone Encounter (Signed)
   [employer] requesting completion of form for diabetic supplies [phone]

## 2020-01-08 NOTE — Telephone Encounter (Signed)
Showering is fine, I wouldn't soak the foot or toe or take a bath. He should re-apply the bandage right after bathing. Thanks!

## 2020-01-12 ENCOUNTER — Other Ambulatory Visit: Payer: Self-pay

## 2020-01-12 ENCOUNTER — Other Ambulatory Visit: Payer: Medicare Other

## 2020-01-12 ENCOUNTER — Other Ambulatory Visit: Payer: Self-pay | Admitting: Internal Medicine

## 2020-01-12 DIAGNOSIS — B20 Human immunodeficiency virus [HIV] disease: Secondary | ICD-10-CM

## 2020-01-12 DIAGNOSIS — Z113 Encounter for screening for infections with a predominantly sexual mode of transmission: Secondary | ICD-10-CM

## 2020-01-13 LAB — T-HELPER CELL (CD4) - (RCID CLINIC ONLY)
CD4 % Helper T Cell: 38 % (ref 33–65)
CD4 T Cell Abs: 830 /uL (ref 400–1790)

## 2020-01-14 ENCOUNTER — Other Ambulatory Visit: Payer: Self-pay | Admitting: Internal Medicine

## 2020-01-14 ENCOUNTER — Telehealth: Payer: Self-pay | Admitting: Internal Medicine

## 2020-01-14 DIAGNOSIS — F32A Depression, unspecified: Secondary | ICD-10-CM

## 2020-01-14 NOTE — Telephone Encounter (Signed)
DULoxetine (CYMBALTA) 60 MG capsule [id number] - [place], [place] - [phone] AT [facility] [address] Phone:  [phone]  Fax:  [phone]     Requesting his medication be sent to this pharmacy instead of [facility]

## 2020-01-15 ENCOUNTER — Other Ambulatory Visit: Payer: Self-pay | Admitting: Internal Medicine

## 2020-01-15 DIAGNOSIS — F32A Depression, unspecified: Secondary | ICD-10-CM

## 2020-01-15 DIAGNOSIS — F419 Anxiety disorder, unspecified: Secondary | ICD-10-CM

## 2020-01-15 MED ORDER — DULOXETINE HCL 60 MG PO CPEP: 60.0000 mg | ORAL_CAPSULE | Freq: Every day | ORAL | 1 refills | Status: DC

## 2020-01-16 ENCOUNTER — Ambulatory Visit
Admission: RE | Admit: 2020-01-16 | Discharge: 2020-01-16 | Disposition: A | Discharge status: Home / Self Care | Payer: Medicare Other | Source: Ambulatory Visit | Attending: Podiatry | Admitting: Podiatry

## 2020-01-16 ENCOUNTER — Other Ambulatory Visit: Payer: Self-pay

## 2020-01-16 DIAGNOSIS — M2011 Hallux valgus (acquired), right foot: Secondary | ICD-10-CM | POA: Diagnosis not present

## 2020-01-16 DIAGNOSIS — L97519 Non-pressure chronic ulcer of other part of right foot with unspecified severity: Secondary | ICD-10-CM | POA: Diagnosis not present

## 2020-01-16 DIAGNOSIS — M6258 Muscle wasting and atrophy, not elsewhere classified, other site: Secondary | ICD-10-CM | POA: Diagnosis not present

## 2020-01-16 DIAGNOSIS — R600 Localized edema: Secondary | ICD-10-CM | POA: Diagnosis not present

## 2020-01-16 DIAGNOSIS — M2031 Hallux varus (acquired), right foot: Secondary | ICD-10-CM

## 2020-01-16 DIAGNOSIS — L97514 Non-pressure chronic ulcer of other part of right foot with necrosis of bone: Secondary | ICD-10-CM

## 2020-01-19 ENCOUNTER — Other Ambulatory Visit: Payer: Self-pay

## 2020-01-19 ENCOUNTER — Encounter: Payer: Self-pay | Admitting: Internal Medicine

## 2020-01-19 ENCOUNTER — Ambulatory Visit (INDEPENDENT_AMBULATORY_CARE_PROVIDER_SITE_OTHER): Payer: Medicare Other | Admitting: Internal Medicine

## 2020-01-19 VITALS — BP 136/82 | HR 58 | Temp 98.6°F | Ht 73.0 in | Wt 204.0 lb

## 2020-01-19 DIAGNOSIS — R9431 Abnormal electrocardiogram [ECG] [EKG]: Secondary | ICD-10-CM | POA: Diagnosis not present

## 2020-01-19 DIAGNOSIS — F32A Depression, unspecified: Secondary | ICD-10-CM

## 2020-01-19 DIAGNOSIS — R0781 Pleurodynia: Secondary | ICD-10-CM | POA: Insufficient documentation

## 2020-01-19 DIAGNOSIS — F419 Anxiety disorder, unspecified: Secondary | ICD-10-CM | POA: Diagnosis not present

## 2020-01-19 DIAGNOSIS — I10 Essential (primary) hypertension: Secondary | ICD-10-CM | POA: Diagnosis not present

## 2020-01-19 DIAGNOSIS — E785 Hyperlipidemia, unspecified: Secondary | ICD-10-CM

## 2020-01-19 DIAGNOSIS — R600 Localized edema: Secondary | ICD-10-CM | POA: Diagnosis not present

## 2020-01-19 LAB — URINALYSIS, ROUTINE W REFLEX MICROSCOPIC
Bilirubin Urine: NEGATIVE
Hgb urine dipstick: NEGATIVE
Ketones, ur: NEGATIVE
Leukocytes,Ua: NEGATIVE
Nitrite: NEGATIVE
Specific Gravity, Urine: 1.015 (ref 1.000–1.030)
Total Protein, Urine: NEGATIVE
Urine Glucose: NEGATIVE
Urobilinogen, UA: 0.2 (ref 0.0–1.0)
pH: 8 (ref 5.0–8.0)

## 2020-01-19 LAB — BRAIN NATRIURETIC PEPTIDE: Pro B Natriuretic peptide (BNP): 35 pg/mL (ref 0.0–100.0)

## 2020-01-19 LAB — D-DIMER, QUANTITATIVE: D-Dimer, Quant: 0.34 mcg/mL FEU (ref <0.50)

## 2020-01-19 LAB — TSH: TSH: 1.45 u[IU]/mL (ref 0.35–4.50)

## 2020-01-19 LAB — TROPONIN I (HIGH SENSITIVITY): High Sens Troponin I: 12 ng/L (ref 2–17)

## 2020-01-19 MED ORDER — LIVALO 1 MG PO TABS: ORAL_TABLET | ORAL | 1 refills | Status: DC

## 2020-01-19 MED ORDER — TRAZODONE HCL 150 MG PO TABS: 450.0000 mg | ORAL_TABLET | Freq: Every day | ORAL | 1 refills | Status: DC

## 2020-01-19 MED ORDER — DULOXETINE HCL 60 MG PO CPEP: 60.0000 mg | ORAL_CAPSULE | Freq: Every day | ORAL | 1 refills | Status: DC

## 2020-01-19 MED ORDER — FUROSEMIDE 20 MG PO TABS: 20.0000 mg | ORAL_TABLET | Freq: Two times a day (BID) | ORAL | 1 refills | Status: DC

## 2020-01-19 NOTE — Patient Instructions (Signed)
Edema  Edema is when you have too much fluid in your body or under your skin. Edema may make your legs, feet, and ankles swell up. Swelling is also common in looser tissues, like around your eyes. This is a common condition. It gets more common as you get older. There are many possible causes of edema. Eating too much salt (sodium) and being on your feet or sitting for a long time can cause edema in your legs, feet, and ankles. Hot weather may make edema worse. Edema is usually painless. Your skin may look swollen or shiny. Follow these instructions at home:  Keep the swollen body part raised (elevated) above the level of your heart when you are sitting or lying down.  Do not sit still or stand for a long time.  Do not wear tight clothes. Do not wear garters on your upper legs.  Exercise your legs. This can help the swelling go down.  Wear elastic bandages or support stockings as told by your doctor.  Eat a low-salt (low-sodium) diet to reduce fluid as told by your doctor.  Depending on the cause of your swelling, you may need to limit how much fluid you drink (fluid restriction).  Take over-the-counter and prescription medicines only as told by your doctor. Contact a doctor if:  Treatment is not working.  You have heart, liver, or kidney disease and have symptoms of edema.  You have sudden and unexplained weight gain. Get help right away if:  You have shortness of breath or chest pain.  You cannot breathe when you lie down.  You have pain, redness, or warmth in the swollen areas.  You have heart, liver, or kidney disease and get edema all of a sudden.  You have a fever and your symptoms get worse all of a sudden. Summary  Edema is when you have too much fluid in your body or under your skin.  Edema may make your legs, feet, and ankles swell up. Swelling is also common in looser tissues, like around your eyes.  Raise (elevate) the swollen body part above the level of your  heart when you are sitting or lying down.  Follow your doctor's instructions about diet and how much fluid you can drink (fluid restriction). This information is not intended to replace advice given to you by your health care provider. Make sure you discuss any questions you have with your health care provider. Document Revised: 10/22/2019 Document Reviewed: 10/22/2019 Elsevier Patient Education  2021 [employer]  

## 2020-01-19 NOTE — Progress Notes (Signed)
Subjective:  Patient ID: Nicholas Whitehead, male    DOB: March 05, 1955  Age: 65 y.o. MRN: [id number]  CC: Hypertension and Diabetes  This visit occurred during the SARS-CoV-2 public health emergency.  Safety protocols were in place, including screening questions prior to the visit, additional usage of staff PPE, and extensive cleaning of exam room while observing appropriate contact time as indicated for disinfecting solutions.    HPI Nicholas Whitehead presents for f/up - He complains of a several week history of painless lower extremity edema.  Based on prescription refills he is no longer taking the loop diuretic.  He also complains of a diffuse chest discomfort that he describes as pleuritic in nature.  He has his baseline level of shortness of breath.  He denies diaphoresis, dizziness, lightheadedness, near syncope, or palpitations.  Outpatient Medications Prior to Visit  Medication Sig Dispense Refill   albuterol (PROVENTIL HFA;VENTOLIN HFA) 108 (90 Base) MCG/ACT inhaler Inhale 2 puffs into the lungs every 6 (six) hours as needed for wheezing or shortness of breath. 1 Inhaler 5   Alcohol Swabs (B-D SINGLE USE SWABS REGULAR) PADS USE AS DIRECTED 300 each 1   aspirin EC 81 MG tablet Take 81 mg by mouth every morning.     BEVESPI AEROSPHERE 9-4.8 MCG/ACT AERO INHALE 2 PUFFS INTO THE LUNGS TWO TIMES DAILY [id number] g 1   BYSTOLIC 20 MG TABS TAKE 1 TABLET EVERY DAY 90 tablet 1   cetirizine (ZYRTEC) 10 MG tablet Take 10 mg by mouth daily.     Cholecalciferol 2000 units TABS Take 1 tablet (2,000 Units total) by mouth daily. 90 tablet 1   Eszopiclone 3 MG TABS TAKE 1 TABLET BY MOUTH IMMEDIATELY BEFORE BEDTIME 90 tablet 1   gabapentin (NEURONTIN) 300 MG capsule TAKE 1 CAPSULE THREE TIMES DAILY 270 capsule 1   glucose blood (ACCU-CHEK GUIDE) test strip TEST BLOOD SUGAR FOUR TIMES DAILY 400 strip 3   insulin glargine, 2 Unit Dial, (TOUJEO MAX SOLOSTAR) 300 UNIT/ML Solostar Pen ADMINISTER 86 UNITS  UNDER THE SKIN DAILY 6 mL 3   insulin lispro (HUMALOG KWIKPEN) 100 UNIT/ML KwikPen Inject 25-30 Units into the skin 3 (three) times daily. 30 mL 3   Insulin Pen Needle (DROPLET PEN NEEDLES) 31G X 8 MM MISC USE TO INJECT INSULIN 500 each 1   Lancets MISC Use to test blood sugar tid. DX: E11.8 300 each 3   linaclotide (LINZESS) 72 MCG capsule Take 1 capsule (72 mcg total) by mouth daily before breakfast. 90 capsule 1   metFORMIN (GLUCOPHAGE-XR) 500 MG 24 hr tablet TAKE 2 TABLETS TWICE DAILY 360 tablet 1   NOVOLOG FLEXPEN 100 UNIT/ML FlexPen ADMINISTER 25 TO 30 UNITS UNDER THE SKIN THREE TIMES DAILY BEFORE MEALS. MAY TAKE LESS IF SUGAR IS BELOW 120 OR EATING A SMALL MEAL 30 mL 1   ODEFSEY 200-25-25 MG TABS tablet TAKE 1 TABLET BY MOUTH EVERY DAY 30 tablet 0   olmesartan (BENICAR) 40 MG tablet Take 1 tablet (40 mg total) by mouth daily. 90 tablet 1   OZEMPIC, 0.25 OR 0.5 MG/DOSE, 2 MG/1.5ML SOPN START WITH INJECTING 0.25 MG UNDER THE SKIN WEEKLY FOR 4 WEEKS, THEN INJECT 0.5 MG UNDER THE SKIN ONCE A WEEK 1.5 mL 0   pioglitazone (ACTOS) 30 MG tablet TAKE 1 TABLET EVERY DAY 90 tablet 1   trazodone (DESYREL) 300 MG tablet TAKE 1 TABLET AT BEDTIME 90 tablet 1   triamcinolone cream (KENALOG) 0.5 % APPLY 1 APPLICATION TOPICALLY THREE TIME  A DAY 100 g 3   DULoxetine (CYMBALTA) 60 MG capsule Take 1 capsule (60 mg total) by mouth daily. 90 capsule 1   furosemide (LASIX) 20 MG tablet TAKE 1 TABLET TWICE DAILY 180 tablet 1   LIVALO 1 MG TABS TAKE 1 TABLET(1 MG) BY MOUTH DAILY 90 tablet 1   traZODone (DESYREL) 150 MG tablet Take 3 tablets (450 mg total) by mouth at bedtime. 270 tablet 1   No facility-administered medications prior to visit.    ROS Review of Systems  Constitutional: Positive for unexpected weight change (wt gain). Negative for appetite change, chills, diaphoresis and fatigue.  HENT: Negative.   Eyes: Negative.   Respiratory: Positive for shortness of breath. Negative for  chest tightness and wheezing.   Cardiovascular: Positive for chest pain and leg swelling. Negative for palpitations.  Gastrointestinal: Negative for abdominal pain, diarrhea, nausea and vomiting.  Endocrine: Negative.  Negative for polydipsia, polyphagia and polyuria.  Genitourinary: Negative.  Negative for difficulty urinating, dysuria and hematuria.  Musculoskeletal: Negative for arthralgias and myalgias.  Skin: Negative.  Negative for color change, pallor and rash.  Neurological: Negative.  Negative for dizziness, weakness and light-headedness.  Hematological: Negative for adenopathy. Does not bruise/bleed easily.  Psychiatric/Behavioral: Negative.     Objective:  BP 136/82    Pulse (!) 58    Temp 98.6 F (37 C) (Oral)    Ht 6\' 1"  (1.854 m)    Wt 204 lb (92.5 kg)    SpO2 96%    BMI 26.91 kg/m   BP Readings from Last 3 Encounters:  01/19/20 136/82  12/24/19 136/86  09/22/19 118/66    Wt Readings from Last 3 Encounters:  01/19/20 204 lb (92.5 kg)  12/24/19 199 lb 6.4 oz (90.4 kg)  09/22/19 189 lb (85.7 kg)    Physical Exam Vitals reviewed.  Constitutional:      General: He is not in acute distress.    Appearance: He is not ill-appearing, toxic-appearing or diaphoretic.  HENT:     Nose: Nose normal.     Mouth/Throat:     Mouth: Mucous membranes are moist.  Eyes:     General: No scleral icterus.    Conjunctiva/sclera: Conjunctivae normal.  Cardiovascular:     Rate and Rhythm: Regular rhythm. Bradycardia present.     Heart sounds: Normal heart sounds, S1 normal and S2 normal. No murmur heard.     Comments: EKG- Sinus bradycardia, 58 bpm Flat T waves lateral ST segment upslopping in anterior leads No change from the prior EKG Pulmonary:     Effort: Pulmonary effort is normal.     Breath sounds: No stridor. No wheezing, rhonchi or rales.  Abdominal:     General: Abdomen is flat.     Palpations: There is no mass.     Tenderness: There is no abdominal tenderness.  There is no guarding.     Hernia: No hernia is present.  Musculoskeletal:     Cervical back: Neck supple.     Right lower leg: 2+ Pitting Edema present.     Left lower leg: 2+ Pitting Edema present.  Lymphadenopathy:     Cervical: No cervical adenopathy.  Skin:    General: Skin is warm and dry.     Coloration: Skin is not pale.  Neurological:     General: No focal deficit present.     Mental Status: He is alert and oriented to person, place, and time. Mental status is at baseline.  Psychiatric:  Mood and Affect: Mood normal.        Behavior: Behavior normal.     Lab Results  Component Value Date   WBC 7.0 01/12/2020   HGB 14.4 01/12/2020   HCT 42.4 01/12/2020   PLT 160 01/12/2020   GLUCOSE 113 (H) 01/12/2020   CHOL 130 04/28/2019   TRIG 99.0 04/28/2019   HDL 35.40 (L) 04/28/2019   LDLCALC 75 04/28/2019   ALT 17 01/12/2020   AST 21 01/12/2020   NA 142 01/12/2020   K 4.1 01/12/2020   CL 104 01/12/2020   CREATININE 1.03 01/12/2020   BUN 9 01/12/2020   CO2 32 01/12/2020   TSH 1.45 01/19/2020   PSA 1.03 03/20/2019   HGBA1C 6.4 12/17/2019   MICROALBUR 1.0 02/19/2019    MR FOOT RIGHT WO CONTRAST  Result Date: 01/16/2020 CLINICAL DATA:  Right foot ulceration EXAM: MRI OF THE RIGHT FOREFOOT WITHOUT CONTRAST TECHNIQUE: Multiplanar, multisequence MR imaging of the right forefoot was performed. No intravenous contrast was administered. COMPARISON:  X-ray 12/23/2019 FINDINGS: Bones/Joint/Cartilage Hallux malleus deformity of the right great toe. There is preservation of the fatty bone marrow signal within the proximal and distal phalanx of the great toe. No bone marrow edema. No bony erosion. No joint effusion of the great toe IP joint. Slight flexion deformities of the second-fifth toes. Pes planus alignment with partial midfoot collapse. Overall, joint spaces are relatively well preserved. No significant joint effusion, synovitis, or erosion. Ligaments Intact Lisfranc  ligament. Collateral ligaments of the forefoot appear intact. Muscles and Tendons Atrophy and fatty infiltration of the intrinsic foot musculature. Diffuse edema-like signal throughout the visualized right foot musculature. Flexor and extensor tendons are intact. No tenosynovitis. Soft tissues There is subtle skin irregularity over the dorsal aspect of the great toe at the level of the IP joint (series 5, image 37), likely reflecting patient's reported ulceration. No underlying fluid collection or abscess. There is mild diffuse subcutaneous edema throughout the forefoot. No organized fluid collections. IMPRESSION: 1. No MRI evidence of acute osteomyelitis of the right great toe. 2. Subtle skin irregularity over the dorsal aspect of the great toe at the level of the IP joint likely reflecting patient's reported ulceration. No underlying fluid collection or abscess. 3. Diffuse edema-like signal throughout the visualized right foot musculature suggesting denervation changes versus a nonspecific myositis. 4. Hallux malleus deformity of the right great toe. Slight flexion deformities of the second-fifth toes. Electronically Signed   By: Davina Poke D.O.   On: 01/16/2020 16:55    Assessment & Plan:   Nicholas Whitehead was seen today for hypertension and diabetes.  Diagnoses and all orders for this visit:  Bilateral leg edema- He has worsening lower extremity edema.  His BNP and troponin are normal.  Evaluation for other causes is negative.  I recommended that he restart the loop diuretic.  I have asked him to undergo an echocardiogram to see if he has developed wall motion abnormalities or a low ejection fraction. -     Troponin I (High Sensitivity); Future -     Brain natriuretic peptide; Future -     D-dimer, quantitative (not at [facility]); Future -     TSH; Future -     Urinalysis, Routine w reflex microscopic; Future -     Urinalysis, Routine w reflex microscopic -     TSH -     D-dimer, quantitative (not at  [facility]) -     Brain natriuretic peptide -  Troponin I (High Sensitivity) -     Cancel: ECHOCARDIOGRAM COMPLETE; Future -     furosemide (LASIX) 20 MG tablet; Take 1 tablet (20 mg total) by mouth 2 (two) times daily. -     ECHOCARDIOGRAM COMPLETE; Future  Chest pain, pleuritic- Evaluation for cardiopulmonary causes is negative. -     Troponin I (High Sensitivity); Future -     Brain natriuretic peptide; Future -     D-dimer, quantitative (not at [facility]); Future -     EKG 12-Lead -     D-dimer, quantitative (not at [facility]) -     Brain natriuretic peptide -     Troponin I (High Sensitivity)  Anxiety and depression -     DULoxetine (CYMBALTA) 60 MG capsule; Take 1 capsule (60 mg total) by mouth daily. -     traZODone (DESYREL) 150 MG tablet; Take 3 tablets (450 mg total) by mouth at bedtime.  Essential hypertension- His blood pressure is adequately well controlled. -     TSH; Future -     Urinalysis, Routine w reflex microscopic; Future -     Urinalysis, Routine w reflex microscopic -     TSH -     furosemide (LASIX) 20 MG tablet; Take 1 tablet (20 mg total) by mouth 2 (two) times daily.  Hyperlipidemia LDL goal <100 -     Pitavastatin Calcium (LIVALO) 1 MG TABS; TAKE 1 TABLET(1 MG) BY MOUTH DAILY  Abnormal electrocardiogram (ECG) (EKG) -     Cancel: ECHOCARDIOGRAM COMPLETE; Future -     ECHOCARDIOGRAM COMPLETE; Future   I have changed Nicholas Whitehead's Livalo and furosemide. I am also having him maintain his cetirizine, aspirin EC, albuterol, Cholecalciferol, Lancets, linaclotide, Accu-Chek Guide, triamcinolone cream, metFORMIN, Bystolic, trazodone, pioglitazone, Bevespi Aerosphere, B-D SINGLE USE SWABS REGULAR, gabapentin, olmesartan, NovoLOG FlexPen, insulin lispro, Eszopiclone, Ozempic (0.25 or 0.5 MG/DOSE), Droplet Pen Needles, Toujeo Max SoloStar, [place], DULoxetine, and traZODone.  Meds ordered this encounter  Medications   DULoxetine (CYMBALTA) 60 MG capsule    Sig: Take 1  capsule (60 mg total) by mouth daily.    Dispense:  90 capsule    Refill:  1   Pitavastatin Calcium (LIVALO) 1 MG TABS    Sig: TAKE 1 TABLET(1 MG) BY MOUTH DAILY    Dispense:  90 tablet    Refill:  1   traZODone (DESYREL) 150 MG tablet    Sig: Take 3 tablets (450 mg total) by mouth at bedtime.    Dispense:  270 tablet    Refill:  1   furosemide (LASIX) 20 MG tablet    Sig: Take 1 tablet (20 mg total) by mouth 2 (two) times daily.    Dispense:  180 tablet    Refill:  1     Follow-up: Return in about 3 weeks (around 02/09/2020).  Scarlette Calico, MD

## 2020-01-21 ENCOUNTER — Telehealth: Payer: Self-pay | Admitting: *Deleted

## 2020-01-21 LAB — CBC WITH DIFFERENTIAL/PLATELET
Absolute Monocytes: 581 cells/uL (ref 200–950)
Basophils Absolute: 21 cells/uL (ref 0–200)
Basophils Relative: 0.3 %
Eosinophils Absolute: 91 cells/uL (ref 15–500)
Eosinophils Relative: 1.3 %
HCT: 42.4 % (ref 38.5–50.0)
Hemoglobin: 14.4 g/dL (ref 13.2–17.1)
Lymphs Abs: 2387 cells/uL (ref 850–3900)
MCH: 33.3 pg — ABNORMAL HIGH (ref 27.0–33.0)
MCHC: 34 g/dL (ref 32.0–36.0)
MCV: 98.1 fL (ref 80.0–100.0)
MPV: 10 fL (ref 7.5–12.5)
Monocytes Relative: 8.3 %
Neutro Abs: 3920 cells/uL (ref 1500–7800)
Neutrophils Relative %: 56 %
Platelets: 160 10*3/uL (ref 140–400)
RBC: 4.32 10*6/uL (ref 4.20–5.80)
RDW: 12.9 % (ref 11.0–15.0)
Total Lymphocyte: 34.1 %
WBC: 7 10*3/uL (ref 3.8–10.8)

## 2020-01-21 LAB — COMPLETE METABOLIC PANEL WITH GFR
AG Ratio: 1.4 (calc) (ref 1.0–2.5)
ALT: 17 U/L (ref 9–46)
AST: 21 U/L (ref 10–35)
Albumin: 4.4 g/dL (ref 3.6–5.1)
Alkaline phosphatase (APISO): 31 U/L — ABNORMAL LOW (ref 35–144)
BUN: 9 mg/dL (ref 7–25)
CO2: 32 mmol/L (ref 20–32)
Calcium: 9.4 mg/dL (ref 8.6–10.3)
Chloride: 104 mmol/L (ref 98–110)
Creat: 1.03 mg/dL (ref 0.70–1.25)
GFR, Est African American: 90 mL/min/{1.73_m2} (ref >=60)
GFR, Est Non African American: 77 mL/min/{1.73_m2} (ref >=60)
Globulin: 3.1 g/dL (calc) (ref 1.9–3.7)
Glucose, Bld: 113 mg/dL — ABNORMAL HIGH (ref 65–99)
Potassium: 4.1 mmol/L (ref 3.5–5.3)
Sodium: 142 mmol/L (ref 135–146)
Total Bilirubin: 0.2 mg/dL (ref 0.2–1.2)
Total Protein: 7.5 g/dL (ref 6.1–8.1)

## 2020-01-21 LAB — HIV-1 RNA QUANT-NO REFLEX-BLD
HIV 1 RNA Quant: <20 Copies/mL
HIV-1 RNA Quant, Log: <1.3 Log cps/mL

## 2020-01-21 LAB — RPR: RPR Ser Ql: NONREACTIVE

## 2020-01-21 NOTE — Telephone Encounter (Signed)
Patient return call  to confirm his appointment for Jan. 17th at 2:15.

## 2020-01-23 DIAGNOSIS — E119 Type 2 diabetes mellitus without complications: Secondary | ICD-10-CM | POA: Diagnosis not present

## 2020-01-26 ENCOUNTER — Other Ambulatory Visit: Payer: Self-pay

## 2020-01-26 ENCOUNTER — Ambulatory Visit: Payer: Medicare Other | Admitting: Podiatry

## 2020-01-26 ENCOUNTER — Encounter: Payer: Self-pay | Admitting: Internal Medicine

## 2020-01-26 ENCOUNTER — Telehealth (INDEPENDENT_AMBULATORY_CARE_PROVIDER_SITE_OTHER): Payer: Medicare Other | Admitting: Internal Medicine

## 2020-01-26 DIAGNOSIS — F32A Depression, unspecified: Secondary | ICD-10-CM

## 2020-01-26 DIAGNOSIS — F419 Anxiety disorder, unspecified: Secondary | ICD-10-CM

## 2020-01-26 DIAGNOSIS — B20 Human immunodeficiency virus [HIV] disease: Secondary | ICD-10-CM

## 2020-01-26 NOTE — Assessment & Plan Note (Signed)
He continues to do well with no issues with the medication.  He will continue with [facility] and rtc in 6 months.

## 2020-01-26 NOTE — Progress Notes (Signed)
I connected with  Nicholas Whitehead on 01/26/20 by phone and verified that I am speaking with the correct person using two identifiers.   I discussed the limitations of evaluation and management by telemedicine. The patient expressed understanding and agreed to proceed.  Location Patient: home Physician: home office  Time of visit: 20 minutes  HPI: this is a follow up visit for HIV He continues on [place] with no missed doses.  CD4 of 830 and viral load < 20.  No new complaints or issues with getting or taking the medication. His creat is good and LFTs wnl.  His main issue is continued depression he is experiencing.  He comments that his friends have continued to notice his significant depression and anhedonia.  This has been going on for the last several years and exacerbated by isolation during COVID-19.  He is now requesting referral to psychiatry.  He does not want to go to our clinic counselor.    ROS: Psychiatric: no SI GI: no n/v/d Skin: no rashes  PE Constitutional: alert, nad Psychiatric: flat, normal thought content

## 2020-01-26 NOTE — Assessment & Plan Note (Signed)
I discussed options and will refer him to psychiatry for evaluation.  He is in agreement with the plan.

## 2020-01-29 ENCOUNTER — Other Ambulatory Visit: Payer: Self-pay

## 2020-01-29 ENCOUNTER — Ambulatory Visit (INDEPENDENT_AMBULATORY_CARE_PROVIDER_SITE_OTHER): Payer: Medicare Other | Admitting: Podiatry

## 2020-01-29 DIAGNOSIS — F17200 Nicotine dependence, unspecified, uncomplicated: Secondary | ICD-10-CM | POA: Diagnosis not present

## 2020-01-29 DIAGNOSIS — E1140 Type 2 diabetes mellitus with diabetic neuropathy, unspecified: Secondary | ICD-10-CM | POA: Diagnosis not present

## 2020-01-29 DIAGNOSIS — L97512 Non-pressure chronic ulcer of other part of right foot with fat layer exposed: Secondary | ICD-10-CM | POA: Diagnosis not present

## 2020-01-29 DIAGNOSIS — Z794 Long term (current) use of insulin: Secondary | ICD-10-CM

## 2020-01-29 DIAGNOSIS — M2031 Hallux varus (acquired), right foot: Secondary | ICD-10-CM | POA: Diagnosis not present

## 2020-01-30 ENCOUNTER — Telehealth: Payer: Self-pay

## 2020-01-30 NOTE — Telephone Encounter (Signed)
DOS 02/06/2020  HALLUX IPJ FUSION RT - 28760 EXCISION WOUND RT - 14040  [employer] MEDICARE EFFECTIVE DATE - 01/10/2020  PLAN DEDUCTIBLE - $0.00 OUT OF POCKET - $4500.00 W/ $4500.00 REMAINING  CO-INSURANCE 0% / Day OUTPATIENT SURGERY 0% / [place] $325 / Day OUTPATIENT SURGERY $325 / Day OUTPATIENT HOSPITA  Notification or Prior Authorization is not required for the requested services  Decision ID #:[id number]

## 2020-01-31 NOTE — Progress Notes (Signed)
Subjective:  Patient ID: Nicholas Whitehead, male    DOB: 23-Nov-1955,  MRN: [id number]  Chief Complaint  Patient presents with  . Wound Check    PT stated that he is doing well he has no major concerns at this time.    65 y.o. male returns with the above complaint. History confirmed with patient. He completed his MRI and is here for review  Objective:  Physical Exam: warm, good capillary refill, no trophic changes or ulcerative lesions and normal DP and PT pulses.  Loss of protective sensation right lower extremity  Full-thickness ulceration improved today, with hyperkeratosis has 0.5 cm ulceration with exposed subcutaneous tissue. No signs of acute infection  Study Result  Narrative & Impression  CLINICAL DATA:  Right foot ulceration  EXAM: MRI OF THE RIGHT FOREFOOT WITHOUT CONTRAST  TECHNIQUE: Multiplanar, multisequence MR imaging of the right forefoot was performed. No intravenous contrast was administered.  COMPARISON:  X-ray 12/23/2019  FINDINGS: Bones/Joint/Cartilage  Hallux malleus deformity of the right great toe. There is preservation of the fatty bone marrow signal within the proximal and distal phalanx of the great toe. No bone marrow edema. No bony erosion. No joint effusion of the great toe IP joint. Slight flexion deformities of the second-fifth toes. Pes planus alignment with partial midfoot collapse. Overall, joint spaces are relatively well preserved. No significant joint effusion, synovitis, or erosion.  Ligaments  Intact Lisfranc ligament. Collateral ligaments of the forefoot appear intact.  Muscles and Tendons  Atrophy and fatty infiltration of the intrinsic foot musculature. Diffuse edema-like signal throughout the visualized right foot musculature. Flexor and extensor tendons are intact. No tenosynovitis.  Soft tissues  There is subtle skin irregularity over the dorsal aspect of the great toe at the level of the IP joint (series  5, image 37), likely reflecting patient's reported ulceration. No underlying fluid collection or abscess. There is mild diffuse subcutaneous edema throughout the forefoot. No organized fluid collections.  IMPRESSION: 1. No MRI evidence of acute osteomyelitis of the right great toe. 2. Subtle skin irregularity over the dorsal aspect of the great toe at the level of the IP joint likely reflecting patient's reported ulceration. No underlying fluid collection or abscess. 3. Diffuse edema-like signal throughout the visualized right foot musculature suggesting denervation changes versus a nonspecific myositis. 4. Hallux malleus deformity of the right great toe. Slight flexion deformities of the second-fifth toes.   Electronically Signed   By: Davina Poke D.O.   On: 01/16/2020 16:55    Assessment:   1. Hallux malleus of right foot   2. Neuropathic ulcer of right foot with fat layer exposed ([place])   3. Type 2 diabetes mellitus with diabetic neuropathy, with long-term current use of insulin ([place])   4. Current every day smoker      Plan:  Patient was evaluated and treated and all questions answered.   Reviewed MRI in detail with the patient. We discussed further treatment options including continued local wound care or surgical correction of the deformity. We discussed possible risks of proceeding with surgery including the risk of nonunion, bone infection and risk of amputation if there is complication. Discussed that even though his diabetes is controlled and he says that he will quit smoking prior to surgery he still remains at high risk for such complications. He understands these risks and wishes to proceed. Surgical we discussed the following procedure: Correction of hallux malleus deformity and arthrodesis of the hallux IPJ. I think it would be likely best  to use percutaneous fixation and a bucket-handle type technique to avoid the presence of retained orthopedic hardware. He  understands the risks and wishes to proceed. Informed consent was signed and reviewed. Surgery will be scheduled mutually agreeable date   Surgical plan:  Procedure: -Right hallux malleus correction, hallux IPJ arthrodesis  Location: -[facility]  Anesthesia plan: -IV sedation local anesthesia  Postoperative pain plan: - Tylenol 1000 mg every 6 hours, ibuprofen 600 mg every 6 hours, gabapentin 300 mg every 8 hours x5 days, oxycodone 5 mg 1-2 tabs every 6 hours only as needed  DVT prophylaxis: -None required  WB Restrictions / DME needs: -WBAT in surgical shoe   Return for after surgery.

## 2020-02-02 ENCOUNTER — Other Ambulatory Visit: Payer: Self-pay | Admitting: Endocrinology

## 2020-02-09 ENCOUNTER — Other Ambulatory Visit: Payer: Self-pay | Admitting: *Deleted

## 2020-02-11 ENCOUNTER — Ambulatory Visit (HOSPITAL_COMMUNITY): Payer: Medicare Other

## 2020-02-11 ENCOUNTER — Telehealth: Payer: Self-pay | Admitting: General Practice

## 2020-02-11 NOTE — Telephone Encounter (Signed)
Nicholas Whitehead is calling due to missing his ride for his Echo today and needing to reschedule. The next available for an Echo at our office isn't until 03/01/20, but the patient is scheduled for surgery 02/27/20. He is requesting he be worked in prior to his surgery so it does not have to be postponed. He states he is willing to go to the hospital or another office to have it performed. Please advise.

## 2020-02-11 NOTE — Telephone Encounter (Signed)
Rn called Lattie Haw and she had already spoke with the patient and scheduled the appointment for 02/174/2022.

## 2020-02-12 ENCOUNTER — Encounter: Payer: Medicare Other | Admitting: Podiatry

## 2020-02-16 ENCOUNTER — Other Ambulatory Visit: Payer: Self-pay

## 2020-02-16 ENCOUNTER — Ambulatory Visit (HOSPITAL_COMMUNITY): Payer: Medicare Other | Attending: Cardiovascular Disease

## 2020-02-16 DIAGNOSIS — R9431 Abnormal electrocardiogram [ECG] [EKG]: Secondary | ICD-10-CM | POA: Diagnosis not present

## 2020-02-16 DIAGNOSIS — R600 Localized edema: Secondary | ICD-10-CM | POA: Diagnosis not present

## 2020-02-16 LAB — ECHOCARDIOGRAM COMPLETE
Area-P 1/2: 4.06 cm2
S' Lateral: 3 cm

## 2020-02-19 ENCOUNTER — Encounter: Payer: Medicare Other | Admitting: Podiatry

## 2020-02-20 ENCOUNTER — Other Ambulatory Visit (INDEPENDENT_AMBULATORY_CARE_PROVIDER_SITE_OTHER): Payer: Medicare Other

## 2020-02-20 ENCOUNTER — Other Ambulatory Visit: Payer: Self-pay

## 2020-02-20 ENCOUNTER — Ambulatory Visit (INDEPENDENT_AMBULATORY_CARE_PROVIDER_SITE_OTHER): Payer: Medicare Other | Admitting: Pharmacist

## 2020-02-20 DIAGNOSIS — E785 Hyperlipidemia, unspecified: Secondary | ICD-10-CM

## 2020-02-20 DIAGNOSIS — E1165 Type 2 diabetes mellitus with hyperglycemia: Secondary | ICD-10-CM

## 2020-02-20 DIAGNOSIS — Z794 Long term (current) use of insulin: Secondary | ICD-10-CM

## 2020-02-20 DIAGNOSIS — I10 Essential (primary) hypertension: Secondary | ICD-10-CM | POA: Diagnosis not present

## 2020-02-20 DIAGNOSIS — F419 Anxiety disorder, unspecified: Secondary | ICD-10-CM | POA: Diagnosis not present

## 2020-02-20 DIAGNOSIS — F32A Depression, unspecified: Secondary | ICD-10-CM | POA: Diagnosis not present

## 2020-02-20 DIAGNOSIS — E1140 Type 2 diabetes mellitus with diabetic neuropathy, unspecified: Secondary | ICD-10-CM

## 2020-02-20 LAB — COMPREHENSIVE METABOLIC PANEL
ALT: 22 U/L (ref 0–53)
AST: 22 U/L (ref 0–37)
Albumin: 4.4 g/dL (ref 3.5–5.2)
Alkaline Phosphatase: 35 U/L — ABNORMAL LOW (ref 39–117)
BUN: 10 mg/dL (ref 6–23)
CO2: 35 mEq/L — ABNORMAL HIGH (ref 19–32)
Calcium: 10 mg/dL (ref 8.4–10.5)
Chloride: 101 mEq/L (ref 96–112)
Creatinine, Ser: 1.01 mg/dL (ref 0.40–1.50)
GFR: 79.82 mL/min (ref >60.00)
Glucose, Bld: 111 mg/dL — ABNORMAL HIGH (ref 70–99)
Potassium: 4 mEq/L (ref 3.5–5.1)
Sodium: 141 mEq/L (ref 135–145)
Total Bilirubin: 0.3 mg/dL (ref 0.2–1.2)
Total Protein: 8.1 g/dL (ref 6.0–8.3)

## 2020-02-20 LAB — HEMOGLOBIN A1C: Hgb A1c MFr Bld: 6.7 % — ABNORMAL HIGH (ref 4.6–6.5)

## 2020-02-20 NOTE — Addendum Note (Signed)
Addended by: Hinda Kehr on: 02/20/2020 01:51 PM   Modules accepted: Orders

## 2020-02-20 NOTE — Progress Notes (Signed)
Chronic Care Management Pharmacy Note  02/20/2020 Name:  Nicholas Whitehead MRN:  [id number] DOB:  April 12, 1955  Subjective: Nicholas Whitehead is an 65 y.o. year old male who is a primary patient of [name], MD.  The CCM team was consulted for assistance with disease management and care coordination needs.    Engaged with patient by telephone for follow up visit in response to provider referral for pharmacy case management and/or care coordination services.   Consent to Services:  The patient was given the following information about Chronic Care Management services today, agreed to services, and gave verbal consent: 1. CCM service includes personalized support from designated clinical staff supervised by the primary care provider, including individualized plan of care and coordination with other care providers 2. 24/7 contact phone numbers for assistance for urgent and routine care needs. 3. Service will only be billed when office clinical staff spend 20 minutes or more in a month to coordinate care. 4. Only one practitioner may furnish and bill the service in a calendar month. 5.The patient may stop CCM services at any time (effective at the end of the month) by phone call to the office staff. 6. The patient will be responsible for cost sharing (co-pay) of up to 20% of the service fee (after annual deductible is met). Patient agreed to services and consent obtained.  Patient Care Team: [name], MD as PCP - General (Internal Medicine) [name], Okey Regal, MD as PCP - Infectious Diseases (Infectious Diseases) Merita Norton, LCSW as Counselor Lenord Fellers, Cleaster Corin, [facility] as Pharmacist (Pharmacist) Calvert Cantor, MD as Consulting Physician (Ophthalmology)  Recent office visits: 01/19/20 Dr [name] OV: c/o LE edema, had stoped loop diuretic. Advised to restart furosemide and ordered ECHO.  09/22/19 Dr [name] OV: dc'd HCTZ due to BP down to 118. Refilled gabapentin.   03/20/19 Dr [name] OV: chronic  f/u; try Livalo for high ASCVD risk.  Recent consult visits: 01/29/20 Dr Sherryle Lis (podiatry): planning foot surgery for February.  01/26/20 Dr Linus Salmons (ID): f/u HIV, doing well. Pt c/o depression, referred to psych at patient's request.  09/18/19 Dr Dwyane Dee (endcorine): DM dx 1998. Insulin since 2006. BG higher than expected based on A1c but fructosamine indicates improved control on Ozempic. Advised to take Novolog 25 min prior to meals. Increased Toujeo to 90 units.  07/10/19 Dr Dwyane Dee (endcorine): given new glucometer. Go back to Ozempic 0.25 mg weekly. Reduce Novolog to 25 units if BG is low-normal <100  07/02/19 Dr Linus Salmons (ID): HIV f/u, labs normal, no med changes. Advised counseling for depression but pt declined.  Hospital visits: None in previous 6 months  Objective:  Lab Results  Component Value Date   CREATININE 1.01 02/20/2020   BUN 10 02/20/2020   GFR 79.82 02/20/2020   GFRNONAA 77 01/12/2020   GFRAA 90 01/12/2020   NA 141 02/20/2020   K 4.0 02/20/2020   CALCIUM 10.0 02/20/2020   CO2 35 (H) 02/20/2020    Lab Results  Component Value Date/Time   HGBA1C 6.7 (H) 02/20/2020 07:55 AM   HGBA1C 6.4 12/17/2019 08:54 AM   FRUCTOSAMINE 280 09/11/2019 08:12 AM   FRUCTOSAMINE 298 (H) 04/28/2019 11:26 AM   GFR 79.82 02/20/2020 07:55 AM   GFR 69.12 12/17/2019 08:54 AM   MICROALBUR 1.0 02/19/2019 10:31 AM   MICROALBUR 0.9 02/15/2018 10:39 AM    Last diabetic Eye exam:  Lab Results  Component Value Date/Time   HMDIABEYEEXA No Retinopathy 06/25/2019 12:00 AM    Last diabetic  Foot exam:  Lab Results  Component Value Date/Time   HMDIABFOOTEX done 05/21/2013 12:00 AM     Lab Results  Component Value Date   CHOL 130 04/28/2019   HDL 35.40 (L) 04/28/2019   LDLCALC 75 04/28/2019   TRIG 99.0 04/28/2019   CHOLHDL 4 04/28/2019    Hepatic Function Latest Ref Rng & Units 02/20/2020 01/12/2020 03/18/2018  Total Protein 6.0 - 8.3 g/dL 8.1 7.5 7.6  Albumin 3.5 - 5.2 g/dL 4.4 - -  AST 0  - 37 U/L 22 21 15   ALT 0 - 53 U/L 22 17 13   Alk Phosphatase 39 - 117 U/L 35(L) - -  Total Bilirubin 0.2 - 1.2 mg/dL 0.3 0.2 0.3    Lab Results  Component Value Date/Time   TSH 1.45 01/19/2020 10:43 AM   TSH 1.21 03/20/2019 12:30 PM    CBC Latest Ref Rng & Units 01/12/2020 03/25/2019 03/18/2018  WBC 3.8 - 10.8 Thousand/uL 7.0 7.3 5.6  Hemoglobin 13.2 - 17.1 g/dL 14.4 15.6 15.3  Hematocrit 38.5 - 50.0 % 42.4 45.5 44.4  Platelets 140 - 400 Thousand/uL 160 189.0 166    Lab Results  Component Value Date/Time   VD25OH 56.40 03/20/2019 12:30 PM   VD25OH 34.66 10/23/2017 10:33 AM    Clinical ASCVD: No  The 10-year ASCVD risk score Mikey Bussing DC Jr., et al., 2013) is: 43.2%   Values used to calculate the score:     Age: 50 years     Sex: Male     Is Non-Hispanic African American: Yes     Diabetic: Yes     Tobacco smoker: Yes     Systolic Blood Pressure: 681 mmHg     Is BP treated: Yes     HDL Cholesterol: 35.4 mg/dL     Total Cholesterol: 130 mg/dL    Depression screen [facility] 2/9 10/27/2019 10/27/2019 07/02/2019  Decreased Interest 0 0 0  Down, Depressed, Hopeless 1 1 3   PHQ - 2 Score 1 1 3   Altered sleeping - - -  Tired, decreased energy - - -  Change in appetite - - -  Feeling bad or failure about yourself  - - -  Trouble concentrating - - -  Moving slowly or fidgety/restless - - -  Suicidal thoughts - - -  PHQ-9 Score - - -  Difficult doing work/chores - - -  Some recent data might be hidden    Social History   Tobacco Use  Smoking Status Current Every Day Smoker  . Packs/day: 0.25  . Types: Cigarettes  Smokeless Tobacco Never Used   BP Readings from Last 3 Encounters:  01/19/20 136/82  12/24/19 136/86  09/22/19 118/66   Pulse Readings from Last 3 Encounters:  01/19/20 (!) 58  12/24/19 (!) 58  09/22/19 73   Wt Readings from Last 3 Encounters:  01/19/20 204 lb (92.5 kg)  12/24/19 199 lb 6.4 oz (90.4 kg)  09/22/19 189 lb (85.7 kg)    Assessment/Interventions:  Review of patient past medical history, allergies, medications, health status, including review of consultants reports, laboratory and other test data, was performed as part of comprehensive evaluation and provision of chronic care management services.   SDOH:  (Social Determinants of Health) assessments and interventions performed: Yes   CCM Care Plan  Allergies  Allergen Reactions  . Benazepril Cough  . Molds & Smuts     Hard to breathe  . Pravastatin Other (See Comments)    myositis  . Glipizide Nausea  Only    Medications Reviewed Today    Reviewed by Charlton Haws, [facility] (Pharmacist) on 02/20/20 at 1328  Med List Status: <None>  Medication Order Taking? Sig Documenting Provider Last Dose Status Informant  albuterol (PROVENTIL HFA;VENTOLIN HFA) 108 (90 Base) MCG/ACT inhaler [id number] Yes Inhale 2 puffs into the lungs every 6 (six) hours as needed for wheezing or shortness of breath. [name], MD Taking Active            Med Note Iva Lento, Octavio Graves   Fri Mar 01, 2018 12:09 PM)    Alcohol Swabs (B-D SINGLE USE SWABS REGULAR) PADS [id number] Yes USE AS DIRECTED [name], MD Taking Active   aspirin EC 81 MG tablet [id number] Yes Take 81 mg by mouth every morning. [provider] Taking Active Self           Med Note Arnette Schaumann Oct 25, 2016 11:45 AM)    BEVESPI AEROSPHERE 9-4.8 MCG/ACT AERO [id number] Yes INHALE 2 PUFFS INTO THE LUNGS TWO TIMES DAILY [name], MD Taking Active   BYSTOLIC 20 MG TABS [id number] Yes TAKE 1 TABLET EVERY DAY [name], MD Taking Active   cetirizine (ZYRTEC) 10 MG tablet [id number] Yes Take 10 mg by mouth daily. [provider] Taking Active Self           Med Note Arnette Schaumann Oct 25, 2016 11:45 AM)    Cholecalciferol 2000 units TABS [id number] Yes Take 1 tablet (2,000 Units total) by mouth daily. [name], MD Taking Active   DULoxetine (CYMBALTA) 60 MG capsule [id number] Yes Take 1 capsule  (60 mg total) by mouth daily. [name], MD Taking Active   Eszopiclone 3 MG TABS [id number] Yes TAKE 1 TABLET BY MOUTH IMMEDIATELY BEFORE BEDTIME [name], MD Taking Active   furosemide (LASIX) 20 MG tablet [id number] Yes Take 1 tablet (20 mg total) by mouth 2 (two) times daily. [name], MD Taking Active   gabapentin (NEURONTIN) 300 MG capsule [id number] Yes TAKE 1 CAPSULE THREE TIMES DAILY [name], MD Taking Active   glucose blood (ACCU-CHEK GUIDE) test strip [id number] Yes TEST BLOOD SUGAR FOUR TIMES DAILY [name], MD Taking Active   insulin glargine, 2 Unit Dial, (TOUJEO MAX SOLOSTAR) 300 UNIT/ML Solostar Pen [id number] Yes ADMINISTER 86 UNITS UNDER THE SKIN DAILY Elayne Snare, MD Taking Active   insulin lispro (HUMALOG KWIKPEN) 100 UNIT/ML KwikPen [id number] Yes Inject 25-30 Units into the skin 3 (three) times daily. Elayne Snare, MD Taking Active   insulin lispro (HUMALOG) 100 UNIT/ML KwikPen [id number] Yes Inject 25-30 Units into the skin 3 (three) times daily. Inject 25-30 units three times a day with meals [provider] Taking Active   Insulin Pen Needle (DROPLET PEN NEEDLES) 31G X 8 MM MISC [id number] Yes USE TO INJECT INSULIN [name], MD Taking Active   Lancets [place] [id number] Yes Use to test blood sugar tid. DX: E11.8 [name], MD Taking Active   linaclotide Rolan Lipa) 72 MCG capsule [id number] Yes Take 1 capsule (72 mcg total) by mouth daily before breakfast. [name], MD Taking Active   metFORMIN (GLUCOPHAGE-XR) 500 MG 24 hr tablet [id number] Yes TAKE 2 TABLETS TWICE DAILY [name], MD Taking Active   NOVOLOG FLEXPEN 100 UNIT/ML FlexPen [id number] Yes ADMINISTER 25 TO 30 UNITS UNDER THE SKIN THREE TIMES DAILY BEFORE MEALS. MAY TAKE LESS IF  SUGAR IS BELOW 120 OR EATING A SMALL MEAL Elayne Snare, MD Taking Active   ODEFSEY 200-25-25 MG TABS tablet [id number] Yes TAKE 1 TABLET BY MOUTH EVERY DAY Michel Bickers, MD Taking Active    olmesartan [facility]) 40 MG tablet [id number] Yes Take 1 tablet (40 mg total) by mouth daily. [name], MD Taking Active   OZEMPIC, 0.25 OR 0.5 MG/DOSE, 2 MG/1.5ML SOPN [id number] Yes START WITH INJECTING 0.25 MG UNDER THE SKIN WEEKLY FOR 4 WEEKS, THEN INJECT 0.5 MG UNDER THE SKIN ONCE A Isaac Laud, MD Taking Active   pioglitazone (ACTOS) 30 MG tablet [id number] Yes TAKE 1 TABLET EVERY DAY [name], MD Taking Active   Pitavastatin Calcium (LIVALO) 1 MG TABS [id number] Yes TAKE 1 TABLET(1 MG) BY MOUTH DAILY [name], MD Taking Active   traZODone (DESYREL) 150 MG tablet [id number] Yes Take 3 tablets (450 mg total) by mouth at bedtime. [name], MD Taking Active   trazodone (DESYREL) 300 MG tablet [id number] Yes TAKE 1 TABLET AT BEDTIME [name], MD Taking Active   triamcinolone cream (KENALOG) 0.5 % [id number] Yes APPLY 1 APPLICATION TOPICALLY THREE TIME A DAY [name], Okey Regal, MD Taking Active           Patient Active Problem List   Diagnosis Date Noted  . Chest pain, pleuritic 01/19/2020  . Bilateral leg edema 01/19/2020  . Eczema 07/02/2019  . Chronic renal disease, stage 3, moderately decreased glomerular filtration rate (GFR) between 30-59 mL/min/1.73 square meter 06/25/2018  . Vitamin D deficiency 04/04/2017  . Uncontrolled type 2 diabetes mellitus with hyperglycemia, with long-term current use of insulin ([place]) 04/04/2017  . Tobacco abuse 12/28/2016  . Chronic idiopathic constipation 10/24/2016  . Insomnia due to anxiety and fear 08/22/2016  . Intermittent claudication ([place]) 02/07/2016  . Drug-induced erectile dysfunction 09/06/2015  . Simple chronic bronchitis ([place]) 05/06/2015  . Mild cognitive impairment 05/06/2015  . Type 2 diabetes mellitus with diabetic neuropathy, with long-term current use of insulin ([place]) 04/30/2015  . Nonspecific abnormal electrocardiogram (ECG) (EKG) 03/25/2015  . Screening examination for venereal disease 08/12/2013  .  Routine general medical examination at a health care facility 05/21/2013  . BPH (benign prostatic hyperplasia) 05/21/2013  . 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 Anxiety and depression 04/18/2011  . Hyperlipidemia LDL goal <100   . Diabetes mellitus with diabetic neuropathy ([place])   . Hypertension   . HIV disease ([place]) 09/12/1995    Immunization History  Administered Date(s) Administered  . Hepatitis A 01/29/2002  . Influenza Split 09/26/2011  . Influenza Whole 10/14/2001, 09/02/2010  . Influenza, High Dose Seasonal PF 09/06/2015  . Influenza,inj,Quad PF,6+ Mos 09/19/2012, 09/11/2013, 09/22/2014, 03/02/2015, 10/24/2016, 10/23/2017, 09/25/2018, 12/24/2019  . PFIZER(Purple Top)SARS-COV-2 Vaccination 09/30/2019, 10/21/2019  . PPD Test 09/14/1995  . Pneumococcal Conjugate-13 01/21/2014  . Pneumococcal Polysaccharide-23 12/16/2001, 09/02/2010, 09/06/2015  . Tdap 05/21/2013    Conditions to be addressed/monitored:  Hypertension, Hyperlipidemia, Diabetes, Depression and Anxiety  Care Plan : [place]  Updates made by Charlton Haws, [place] since 02/20/2020 12:00 AM    Problem: Hypertension, Hyperlipidemia, Diabetes, Depression and Anxiety   Priority: High    Goal: Disease management   Start Date: 02/20/2020  Expected End Date: 08/19/2020  This Visit's Progress: On track  Priority: High  Note:   Current Barriers:  . Unable to independently monitor therapeutic efficacy  Pharmacist Clinical Goal(s):  Marland Kitchen Over the next 90 days, patient will achieve adherence to  monitoring guidelines and medication adherence to achieve therapeutic efficacy through collaboration with PharmD and provider.   Interventions: . 1:1 collaboration with [name], MD regarding development and update of comprehensive plan of care as evidenced by provider attestation and co-signature . Inter-disciplinary care team collaboration (see longitudinal plan of care) . Comprehensive medication review  performed; medication list updated in electronic medical record  Hypertension (BP goal < 130/80) Current regimen:  ? Bystolic 20 mg daily ? Olmesartan 40 mg daily ? Furosemide 20 mg twice a day Interventions: ? Discussed BP goals and benefits of medications for prevention of heart attack / stroke Patient self care activities ? Check BP as needed, document, and provide at future appointments ? Ensure daily salt intake < 2300 mg/day   Hyperlipidemia (LDL goal < 100) Current regimen:  ? Livalo 1 mg daily ? Aspirin 81 mg daily Interventions: ? Discussed cholesterol goals and benefits of medications for prevention of heart attack / stroke Patient self care activities  ? Continue current medications   Diabetes (A1c goal < 7%) Current regimen:  ? Toujeo Max 90 units daily ? Novolog 25-30 units before meals ? Metformin ER 500 mg - 2 tab twice a day ? Pioglitazone 30 mg daily ? Ozempic 0.25 mg weekly Interventions: ? Discussed blood sugar goals and benefits of medications for prevention of diabetic complications Patient self care activities ? Check blood sugar 3-4 times daily, document, and provide at future appointments ? Contact provider with any episodes of hypoglycemia   Depression / Insomnia Current regimen:  ? Duloxetine 60 mg daily ? Trazodone 450 mg at bedtime ? Eszoplicone 3 mg at bedtime Interventions: ? Discussed benefits and safety risks with medications ? Pt is awaiting scheduling with psychiatry Patient self care activities  ? Continue medications as directed  Patient Goals/Self-Care Activities . Over the next 90 days, patient will:  - take medications as prescribed focus on medication adherence by pill box check glucose daily, document, and provide at future appointments  Follow Up Plan: Telephone follow up appointment with care management team member scheduled for: 3 months      Medication Assistance: None required.  Patient affirms current coverage  meets needs.  Patient's preferred pharmacy is:  [facility] [phone] [place], [place] [address] [place] [address] Phone: [phone] Fax: [phone]  [facility] [phone] [address] [place], [place] [address] [place] [address] Phone: [phone] Fax: [phone]  Uses pill box? Yes Pt endorses 100% compliance  We discussed: Current pharmacy is preferred with insurance plan and patient is satisfied with pharmacy services Patient decided to: Continue current medication management strategy  Care Plan and Follow Up Patient Decision:  Patient agrees to Care Plan and Follow-up.  Plan: Telephone follow up appointment with care management team member scheduled for:  3 months  Charlene Brooke, PharmD, [facility] Clinical Pharmacist [facility] [phone]

## 2020-02-20 NOTE — Patient Instructions (Signed)
Visit Information  Phone number for Pharmacist: [phone]  Goals Addressed            This Visit's Progress   . Manage My Medicine       Timeframe:  Long-Range Goal Priority:  High Start Date:   02/20/20                          Expected End Date:    08/19/20                   Follow Up Date *06/08/20   - call for medicine refill 2 or 3 days before it runs out - call if I am sick and can't take my medicine - keep a list of all the medicines I take; vitamins and herbals too - use a pillbox to sort medicine    Why is this important?   . These steps will help you keep on track with your medicines.    . COMPLETED: Pharmacy Care Plan       CARE PLAN ENTRY (see longitudinal plan of care for additional care plan information)  Current Barriers:  . Chronic Disease Management support, education, and care coordination needs related to Hypertension, Hyperlipidemia, and Diabetes   Hypertension BP Readings from Last 3 Encounters:  09/22/19 118/66  09/18/19 140/72  07/10/19 (!) 148/76 .  Pharmacist Clinical Goal(s): o Over the next 30 days, patient will work with PharmD and providers to maintain BP goal <130/80 . Current regimen:  o Bystolic 20 mg daily o Olmesartan 40 mg daily o Furosemide 20 mg twice a day . Interventions: o Discussed BP goals and benefits of medications for prevention of heart attack / stroke . Patient self care activities - Over the next 30 days, patient will: o Check BP as needed, document, and provide at future appointments o Ensure daily salt intake < 2300 mg/day  Hyperlipidemia Lab Results  Component Value Date/Time   LDLCALC 75 04/28/2019 11:26 AM   LDLCALC 90 03/18/2018 09:54 AM .  Pharmacist Clinical Goal(s): o Over the next 30 days, patient will work with PharmD and providers to maintain LDL goal < 100 . Current regimen:  o Livalo 1 mg daily o Aspirin 81 mg daily . Interventions: o Discussed cholesterol goals and benefits of medications for  prevention of heart attack / stroke . Patient self care activities - Over the next 30 days, patient will: o Continue current medications  Diabetes Lab Results  Component Value Date/Time   HGBA1C 7.0 (H) 07/07/2019 10:17 AM   HGBA1C 7.6 (H) 02/19/2019 10:31 AM .  Pharmacist Clinical Goal(s): o Over the next 30 days, patient will work with PharmD and providers to achieve A1c goal <7% . Current regimen:  o Toujeo Max 90 units daily o Novolog 25-30 units before meals o Metformin ER 500 mg - 2 tab twice a day o Pioglitazone 30 mg daily o Ozempic 0.25 mg weekly . Interventions: o Discussed blood sugar goals and benefits of medications for prevention of diabetic complications . Patient self care activities - Over the next 30 days, patient will: o Check blood sugar 3-4 times daily, document, and provide at future appointments o Contact provider with any episodes of hypoglycemia  Depression / Insomnia . Pharmacist Clinical Goal(s) o Over the next 30 days, patient will work with PharmD and providers to optimize therapy . Current regimen:  o Duloxetine 60 mg daily o Trazodone 450 mg at bedtime o Eszoplicone  3 mg at bedtime . Interventions: o Discussed benefits and safety risks with medications . Patient self care activities - Over the next 30 days, patient will: o Continue medications as directed  Medication management . Pharmacist Clinical Goal(s): o Over the next 30 days, patient will work with PharmD and providers to maintain optimal medication adherence . [facility] . Interventions o Comprehensive medication review performed. o Continue current medication management strategy o Patient is switching insurance from [employer] to [employer] - needs submission of tier exceptions for Branded products by January . Patient self care activities - Over the next 30 days, patient will: o Focus on medication adherence by fill date o Take medications as  prescribed o Report any questions or concerns to PharmD and/or provider(s)  Please see past updates related to this goal by clicking on the "Past Updates" button in the selected goal       Patient Care Plan: CCM Pharmacy Care Plan    Problem Identified: Hypertension, Hyperlipidemia, Diabetes, Depression and Anxiety   Priority: High    Goal: Disease management   Start Date: 02/20/2020  Expected End Date: 08/19/2020  This Visit's Progress: On track  Priority: High  Note:   Current Barriers:  . Unable to independently monitor therapeutic efficacy  Pharmacist Clinical Goal(s):  Marland Kitchen Over the next 90 days, patient will achieve adherence to monitoring guidelines and medication adherence to achieve therapeutic efficacy through collaboration with PharmD and provider.   Interventions: . 1:1 collaboration with [name], MD regarding development and update of comprehensive plan of care as evidenced by provider attestation and co-signature . Inter-disciplinary care team collaboration (see longitudinal plan of care) . Comprehensive medication review performed; medication list updated in electronic medical record  Hypertension (BP goal < 130/80) Current regimen:  ? Bystolic 20 mg daily ? Olmesartan 40 mg daily ? Furosemide 20 mg twice a day Interventions: ? Discussed BP goals and benefits of medications for prevention of heart attack / stroke Patient self care activities ? Check BP as needed, document, and provide at future appointments ? Ensure daily salt intake < 2300 mg/day   Hyperlipidemia (LDL goal < 100) Current regimen:  ? Livalo 1 mg daily ? Aspirin 81 mg daily Interventions: ? Discussed cholesterol goals and benefits of medications for prevention of heart attack / stroke Patient self care activities  ? Continue current medications   Diabetes (A1c goal < 7%) Current regimen:  ? Toujeo Max 90 units daily ? Novolog 25-30 units before meals ? Metformin ER 500 mg - 2 tab  twice a day ? Pioglitazone 30 mg daily ? Ozempic 0.25 mg weekly Interventions: ? Discussed blood sugar goals and benefits of medications for prevention of diabetic complications Patient self care activities ? Check blood sugar 3-4 times daily, document, and provide at future appointments ? Contact provider with any episodes of hypoglycemia   Depression / Insomnia Current regimen:  ? Duloxetine 60 mg daily ? Trazodone 450 mg at bedtime ? Eszoplicone 3 mg at bedtime Interventions: ? Discussed benefits and safety risks with medications ? Pt is awaiting scheduling with psychiatry Patient self care activities  ? Continue medications as directed  Patient Goals/Self-Care Activities . Over the next 90 days, patient will:  - take medications as prescribed focus on medication adherence by pill box check glucose daily, document, and provide at future appointments  Follow Up Plan: Telephone follow up appointment with care management team member scheduled for: 3 months  The patient verbalized understanding of instructions, educational materials, and care plan provided today and declined offer to receive copy of patient instructions, educational materials, and care plan.  Telephone follow up appointment with pharmacy team member scheduled for: 3 months  Charlene Brooke, PharmD, [facility] Clinical Pharmacist [place] [facility] [phone]

## 2020-02-22 DIAGNOSIS — E119 Type 2 diabetes mellitus without complications: Secondary | ICD-10-CM | POA: Diagnosis not present

## 2020-02-23 ENCOUNTER — Encounter: Payer: Medicare Other | Admitting: Endocrinology

## 2020-02-23 ENCOUNTER — Other Ambulatory Visit: Payer: Self-pay

## 2020-02-24 ENCOUNTER — Telehealth: Payer: Self-pay | Admitting: Internal Medicine

## 2020-02-24 DIAGNOSIS — I10 Essential (primary) hypertension: Secondary | ICD-10-CM

## 2020-02-24 MED ORDER — NEBIVOLOL HCL 20 MG PO TABS: 1.0000 | ORAL_TABLET | Freq: Every day | ORAL | 0 refills | Status: DC

## 2020-02-24 NOTE — Telephone Encounter (Signed)
BYSTOLIC 20 MG TABS [facility] [phone] - [place], [place] Phone:  [phone]  Fax:  [phone]     Last seen- 01.10.22 Next apt- n/a Needs it sent to this pharmacy instead of [place]

## 2020-02-24 NOTE — Progress Notes (Signed)
This encounter was created in error - please disregard.

## 2020-02-25 ENCOUNTER — Ambulatory Visit: Payer: Medicare Other | Admitting: Podiatry

## 2020-02-26 ENCOUNTER — Other Ambulatory Visit (HOSPITAL_COMMUNITY): Payer: Medicare Other

## 2020-02-27 ENCOUNTER — Other Ambulatory Visit: Payer: Self-pay | Admitting: Podiatry

## 2020-02-27 DIAGNOSIS — M2031 Hallux varus (acquired), right foot: Secondary | ICD-10-CM | POA: Diagnosis not present

## 2020-02-27 DIAGNOSIS — L97519 Non-pressure chronic ulcer of other part of right foot with unspecified severity: Secondary | ICD-10-CM | POA: Diagnosis not present

## 2020-02-27 DIAGNOSIS — M2041 Other hammer toe(s) (acquired), right foot: Secondary | ICD-10-CM | POA: Diagnosis not present

## 2020-02-27 MED ORDER — OXYCODONE-ACETAMINOPHEN 5-325 MG PO TABS
1.0000 | ORAL_TABLET | Freq: Four times a day (QID) | ORAL | 0 refills | Status: AC | PRN
Start: 2020-02-27 — End: 2020-03-05

## 2020-02-27 NOTE — Progress Notes (Signed)
02/27/20 [place] IPJ fusion and wound excision

## 2020-03-08 ENCOUNTER — Encounter: Payer: Medicare Other | Admitting: Podiatry

## 2020-03-08 ENCOUNTER — Encounter: Payer: Self-pay | Admitting: Podiatry

## 2020-03-08 ENCOUNTER — Ambulatory Visit (INDEPENDENT_AMBULATORY_CARE_PROVIDER_SITE_OTHER): Payer: Medicare Other | Admitting: Podiatry

## 2020-03-08 ENCOUNTER — Ambulatory Visit (INDEPENDENT_AMBULATORY_CARE_PROVIDER_SITE_OTHER): Payer: Medicare Other

## 2020-03-08 ENCOUNTER — Other Ambulatory Visit: Payer: Self-pay

## 2020-03-08 DIAGNOSIS — M2031 Hallux varus (acquired), right foot: Secondary | ICD-10-CM

## 2020-03-08 DIAGNOSIS — Z9889 Other specified postprocedural states: Secondary | ICD-10-CM

## 2020-03-08 NOTE — Progress Notes (Signed)
  Subjective:  Patient ID: Nicholas Whitehead, male    DOB: 1956/01/07,  MRN: [id number]  Chief Complaint  Patient presents with  . Routine Post Op    RIGHT foot post op. PT stated that he is doing well he does have some minor pain. NO major concerns at this time.    DOS: 02/27/2020 Procedure: Right foot IPJ arthrodesis and excision of wound  65 y.o. male returns for post-op check.  Doing well  Review of Systems: Negative except as noted in the HPI. Denies N/V/F/Ch.   Objective:  There were no vitals filed for this visit. There is no height or weight on file to calculate BMI. Constitutional Well developed. Well nourished.  Vascular Foot warm and well perfused. Capillary refill normal to all digits.   Neurologic Normal speech. Oriented to person, place, and time. Epicritic sensation to light touch grossly reduced bilaterally.  Dermatologic Skin healing well without signs of infection. Skin edges well coapted without signs of infection.  Pin sites without drainage or infection  Orthopedic: Tenderness to palpation noted about the surgical site.  Kirschner wires intact   Radiographs: Status post arthrodesis with K wire fixation of the right IPJ hallux Assessment:   1. Status post surgery    Plan:  Patient was evaluated and treated and all questions answered.  S/p foot surgery right -Progressing as expected post-operatively. -XR: As above -WB Status: WBAT in surgical shoe -Sutures: We will leave intact for 2 more weeks. -Medications: No refills required -Foot redressed.  Return in about 10 days (around 03/18/2020) for suture removal.

## 2020-03-09 ENCOUNTER — Encounter: Payer: Self-pay | Admitting: Endocrinology

## 2020-03-09 ENCOUNTER — Ambulatory Visit: Payer: Medicare Other | Admitting: Endocrinology

## 2020-03-09 VITALS — BP 156/92 | HR 58 | Ht 75.0 in | Wt 198.0 lb

## 2020-03-09 DIAGNOSIS — Z794 Long term (current) use of insulin: Secondary | ICD-10-CM

## 2020-03-09 DIAGNOSIS — E1165 Type 2 diabetes mellitus with hyperglycemia: Secondary | ICD-10-CM

## 2020-03-09 DIAGNOSIS — I10 Essential (primary) hypertension: Secondary | ICD-10-CM | POA: Diagnosis not present

## 2020-03-09 NOTE — Progress Notes (Signed)
Patient ID: Nicholas Whitehead, male   DOB: 03-Aug-1955, 65 y.o.   MRN: [id number]           Patient ID: Nicholas Whitehead, male   DOB: 1955-10-09, 65 y.o.   MRN: [id number]   Reason for Appointment: Type II Diabetes follow-up   History of Present Illness   Diagnosis date: 1998    Previous history: He was started on oral agents at diagnosis and around 2006 was put on insulin His blood sugars are usually well controlled when he takes Actos along with his insulin. He had been on Invokana since 04/2013  He requires large doses of insulin especially basal insulin   Recent history:   Insulin regimen: Toujeo 86 units p.m.;  Novolog 30 units before meals         Oral hypoglycemic drugs: Metformin ER 1000 mg twice a day, Actos 30 mg, Ozempic 0.25 weekly  A1c is back up to 6.7 compared to 6.4   Previous fructosamine 298  Current management, blood sugar patterns and problems:  He is having somewhat variable blood sugars with tendency to high readings in the early afternoons and occasionally late evenings  However he thinks this is from basically stress, not being able to plan his meals being away from home and issues with his foot infection  He may sometimes not take his insulin until after eating if the blood sugar is near normal  His fasting blood sugars are better  He has continued the same basic doses of insulin  He has had the freestyle libre started but it came off and he is not sure how to put it on again  Otherwise checking his blood sugars up to 3 times a day  Weight has leveled off  He has been continued only on 0.25 mg Ozempic since he had nausea and weight loss with the higher doses  He has been trying to do some walking but not a lot and recently having foot ulcer     Side effects from medications:  urgency, frequent urination from [place] blood glucose:  2-3 + times a day  Glucometer:   Accu-Chek  Blood sugar readings for 2 weeks and averages for 30  days   PRE-MEAL Fasting/ morning Lunch Dinner Bedtime Overall  Glucose range:  111-177  107-198 91-173  114-224   Mean/median:  128  158  147  157 151    Prior  PRE-MEAL  mornings Lunch Dinner Bedtime Overall  Glucose range: 97-167  110-55  79  140-186   Mean/median:  140    160 148           Meals: Breakfast variable, 9-10 AM Lunch 2-3pm  usually avoiding high-fat meals, dinner at about 6 pm          Dietician visit: Most recent: 2013    Weight control:  Wt Readings from Last 3 Encounters:  03/09/20 198 lb (89.8 kg)  01/19/20 204 lb (92.5 kg)  12/24/19 199 lb 6.4 oz (90.4 kg)          Diabetes labs:  Lab Results  Component Value Date   HGBA1C 6.7 (H) 02/20/2020   HGBA1C 6.4 12/17/2019   HGBA1C 7.0 (H) 07/07/2019   Lab Results  Component Value Date   MICROALBUR 1.0 02/19/2019   LDLCALC 75 04/28/2019   CREATININE 1.01 02/20/2020   Lab Results  Component Value Date   FRUCTOSAMINE 280 09/11/2019   FRUCTOSAMINE 298 (H) 04/28/2019  FRUCTOSAMINE 276 09/13/2018   FRUCTOSAMINE 295 (H) 07/09/2018   FRUCTOSAMINE 309 (H) 02/13/2018    Other active problems discussed today: See review of systems   No visits with results within 1 Week(s) from this visit.  Latest known visit with results is:  Lab on 02/20/2020  Component Date Value Ref Range Status  . Sodium 02/20/2020 141  135 - 145 mEq/L Final  . Potassium 02/20/2020 4.0  3.5 - 5.1 mEq/L Final  . Chloride 02/20/2020 101  96 - 112 mEq/L Final  . CO2 02/20/2020 35* 19 - 32 mEq/L Final  . Glucose, Bld 02/20/2020 111* 70 - 99 mg/dL Final  . BUN 02/20/2020 10  6 - 23 mg/dL Final  . Creatinine, Ser 02/20/2020 1.01  0.40 - 1.50 mg/dL Final  . Total Bilirubin 02/20/2020 0.3  0.2 - 1.2 mg/dL Final  . Alkaline Phosphatase 02/20/2020 35* 39 - 117 U/L Final  . AST 02/20/2020 22  0 - 37 U/L Final  . ALT 02/20/2020 22  0 - 53 U/L Final  . Total Protein 02/20/2020 8.1  6.0 - 8.3 g/dL Final  . Albumin 02/20/2020 4.4  3.5  - 5.2 g/dL Final  . GFR 02/20/2020 79.82  >60.00 mL/min Final   Calculated using the CKD-EPI Creatinine Equation (2021)  . Calcium 02/20/2020 10.0  8.4 - 10.5 mg/dL Final  . Hgb A1c MFr Bld 02/20/2020 6.7* 4.6 - 6.5 % Final   Glycemic Control Guidelines for People with Diabetes:Non Diabetic:  <6%Goal of Therapy: <7%Additional Action Suggested:  >8%      Allergies as of 03/09/2020      Reactions   Benazepril Cough   Molds & Smuts    Hard to breathe   Pravastatin Other (See Comments)   myositis   Glipizide Nausea Only      Medication List       Accurate as of March 09, 2020  2:39 PM. If you have any questions, ask your nurse or doctor.        Accu-Chek Guide test strip Generic drug: glucose blood TEST BLOOD SUGAR FOUR TIMES DAILY   albuterol 108 (90 Base) MCG/ACT inhaler Commonly known as: VENTOLIN HFA Inhale 2 puffs into the lungs every 6 (six) hours as needed for wheezing or shortness of breath.   aspirin EC 81 MG tablet Take 81 mg by mouth every morning.   B-D SINGLE USE SWABS REGULAR Pads USE AS DIRECTED   Bevespi Aerosphere 9-4.8 MCG/ACT Aero Generic drug: Glycopyrrolate-Formoterol INHALE 2 PUFFS INTO THE LUNGS TWO TIMES DAILY   cetirizine 10 MG tablet Commonly known as: ZYRTEC Take 10 mg by mouth daily.   Cholecalciferol 50 MCG (2000 UT) Tabs Take 1 tablet (2,000 Units total) by mouth daily.   Droplet Pen Needles 31G X 8 MM Misc Generic drug: Insulin Pen Needle USE TO INJECT INSULIN   DULoxetine 60 MG capsule Commonly known as: CYMBALTA Take 1 capsule (60 mg total) by mouth daily.   Eszopiclone 3 MG Tabs TAKE 1 TABLET BY MOUTH IMMEDIATELY BEFORE BEDTIME   furosemide 20 MG tablet Commonly known as: LASIX Take 1 tablet (20 mg total) by mouth 2 (two) times daily.   gabapentin 300 MG capsule Commonly known as: NEURONTIN TAKE 1 CAPSULE THREE TIMES DAILY   insulin lispro 100 UNIT/ML KwikPen Commonly known as: HUMALOG Inject 25-30 Units into the skin  3 (three) times daily. Inject 25-30 units three times a day with meals   insulin lispro 100 UNIT/ML KwikPen Commonly known as:  HumaLOG KwikPen Inject 25-30 Units into the skin 3 (three) times daily.   Lancets Misc Use to test blood sugar tid. DX: E11.8   linaclotide 72 MCG capsule Commonly known as: Linzess Take 1 capsule (72 mcg total) by mouth daily before breakfast.   Livalo 1 MG Tabs Generic drug: Pitavastatin Calcium TAKE 1 TABLET(1 MG) BY MOUTH DAILY   metFORMIN 500 MG 24 hr tablet Commonly known as: GLUCOPHAGE-XR TAKE 2 TABLETS TWICE DAILY   Nebivolol HCl 20 MG Tabs Commonly known as: Bystolic Take 1 tablet (20 mg total) by mouth daily.   NovoLOG FlexPen 100 UNIT/ML FlexPen Generic drug: insulin aspart ADMINISTER 25 TO 30 UNITS UNDER THE SKIN THREE TIMES DAILY BEFORE MEALS. MAY TAKE LESS IF SUGAR IS BELOW 120 OR EATING A SMALL MEAL   Odefsey 200-25-25 MG Tabs tablet Generic drug: emtricitabine-rilpivir-tenofovir AF TAKE 1 TABLET BY MOUTH EVERY DAY   olmesartan 40 MG tablet Commonly known as: BENICAR Take 1 tablet (40 mg total) by mouth daily.   Ozempic (0.25 or 0.5 MG/DOSE) 2 MG/1.5ML Sopn Generic drug: Semaglutide(0.25 or 0.5MG /DOS) START WITH INJECTING 0.25 MG UNDER THE SKIN WEEKLY FOR 4 WEEKS, THEN INJECT 0.5 MG UNDER THE SKIN ONCE A WEEK   pioglitazone 30 MG tablet Commonly known as: ACTOS TAKE 1 TABLET EVERY DAY   Toujeo Max SoloStar 300 UNIT/ML Solostar Pen Generic drug: insulin glargine (2 Unit Dial) ADMINISTER 86 UNITS UNDER THE SKIN DAILY   trazodone 300 MG tablet Commonly known as: DESYREL TAKE 1 TABLET AT BEDTIME   traZODone 150 MG tablet Commonly known as: DESYREL Take 3 tablets (450 mg total) by mouth at bedtime.   triamcinolone cream 0.5 % Commonly known as: KENALOG APPLY 1 APPLICATION TOPICALLY THREE TIME A DAY       Allergies:  Allergies  Allergen Reactions  . Benazepril Cough  . Molds & Smuts     Hard to breathe  .  Pravastatin Other (See Comments)    myositis  . Glipizide Nausea Only    Past Medical History:  Diagnosis Date  . Anxiety   . Cataract    right   . Depression   . Diabetes mellitus without complication ([place])   . DM (diabetes mellitus screen)   . HIV disease ([place]) 09/12/1995  . HIV infection ([place])   . Hypercholesterolemia   . Hyperlipidemia   . Hypertension     Past Surgical History:  Procedure Laterality Date  . COLONOSCOPY WITH PROPOFOL  01/16/2012   Procedure: COLONOSCOPY WITH PROPOFOL;  Surgeon: Garlan Fair, MD;  Location: [facility];  Service: Endoscopy;  Laterality: N/A;  . EYE SURGERY  2005   catarct surgery  . optic lens surgery  2005    Family History  Problem Relation Age of Onset  . Arthritis Mother   . Diabetes Mother   . Heart disease Mother   . Stroke Mother   . Arthritis Father   . Diabetes Father   . Heart disease Father     Social History:  reports that he has been smoking cigarettes. He has been smoking about 0.25 packs per day. He has never used smokeless tobacco. He reports that he does not drink alcohol and does not use drugs.  Review of Systems:  Hypertension:  on Bystolic  and on Benicar HCT 1/2, previously on losartan, followed by PCP Also on Lasix as needed He thinks his blood pressure was high because he is trying to quit smoking  BP Readings from Last 3  Encounters:  03/09/20 (!) 160/88  01/19/20 136/82  12/24/19 136/86   RENAL function: fairly consistently normal  Lab Results  Component Value Date   CREATININE 1.01 02/20/2020   CREATININE 1.03 01/12/2020   CREATININE 1.14 12/17/2019    Lipids: LDL is normal as follows Treatment prescribed by PCP, currently on Livalo   Lab Results  Component Value Date   CHOL 130 04/28/2019   HDL 35.40 (L) 04/28/2019   LDLCALC 75 04/28/2019   TRIG 99.0 04/28/2019   CHOLHDL 4 04/28/2019    Neuropathy: Has had paresthesiae in feet and legs, these are treated with gabapentin 300 mg 3  times a day   Forms for the diabetic shoes were sent in July 21 to the podiatrist. Has sensory loss on exam He had surgery for foot ulcer on the right side    LABS:  No visits with results within 1 Week(s) from this visit.  Latest known visit with results is:  Lab on 02/20/2020  Component Date Value Ref Range Status  . Sodium 02/20/2020 141  135 - 145 mEq/L Final  . Potassium 02/20/2020 4.0  3.5 - 5.1 mEq/L Final  . Chloride 02/20/2020 101  96 - 112 mEq/L Final  . CO2 02/20/2020 35* 19 - 32 mEq/L Final  . Glucose, Bld 02/20/2020 111* 70 - 99 mg/dL Final  . BUN 02/20/2020 10  6 - 23 mg/dL Final  . Creatinine, Ser 02/20/2020 1.01  0.40 - 1.50 mg/dL Final  . Total Bilirubin 02/20/2020 0.3  0.2 - 1.2 mg/dL Final  . Alkaline Phosphatase 02/20/2020 35* 39 - 117 U/L Final  . AST 02/20/2020 22  0 - 37 U/L Final  . ALT 02/20/2020 22  0 - 53 U/L Final  . Total Protein 02/20/2020 8.1  6.0 - 8.3 g/dL Final  . Albumin 02/20/2020 4.4  3.5 - 5.2 g/dL Final  . GFR 02/20/2020 79.82  >60.00 mL/min Final   Calculated using the CKD-EPI Creatinine Equation (2021)  . Calcium 02/20/2020 10.0  8.4 - 10.5 mg/dL Final  . Hgb A1c MFr Bld 02/20/2020 6.7* 4.6 - 6.5 % Final   Glycemic Control Guidelines for People with Diabetes:Non Diabetic:  <6%Goal of Therapy: <7%Additional Action Suggested:  >8%      Examination:   BP (!) 160/88   Pulse (!) 58   Ht 6\' 3"  (1.905 m)   Wt 198 lb (89.8 kg)   SpO2 93%   BMI 24.75 kg/m   Body mass index is 24.75 kg/m.     ASSESSMENT/ PLAN:    Diabetes type 2 on insulin:   See history of present illness for detailed discussion of his current management, blood sugar patterns and problems identified  His A1c is 6.7   He is on basal bolus insulin, Metformin, Ozempic 0.25 mg weekly and Actos 30 mg  He is generally having fairly good control although because of variable diet has had some high readings at times  He has started checking his sugars more lately up to 3  times a day Also does need some help setting up his freestyle [place] today which he has just started and no data available  No hypoglycemia even with large doses of insulin   Recommendations: Explained to him how the freestyle libre needs to be used as a Sports coach started this for him today He will make sure he checks his blood sugar 3-4 times a day with this Discussed blood sugar targets after meals and overnight He can adjust his  NovoLog up or down 5 units based on his meal size and Premeal blood sugar If his blood sugars are rising excessively on the libre can adjust the dose further for similar meals  No change in Toujeo Consider increasing Ozempic if his control is not as good  HYPERTENSION: Blood pressure is relatively high and believes this is from trying to stop smoking and anxiety, to discuss with PCP   There are no Patient Instructions on file for this visit.       Elayne Snare 03/09/2020, 2:39 PM           ..

## 2020-03-11 ENCOUNTER — Telehealth (HOSPITAL_COMMUNITY): Payer: Medicare Other | Admitting: Psychiatry

## 2020-03-18 ENCOUNTER — Ambulatory Visit (INDEPENDENT_AMBULATORY_CARE_PROVIDER_SITE_OTHER): Payer: Medicare Other | Admitting: Podiatry

## 2020-03-18 ENCOUNTER — Other Ambulatory Visit: Payer: Self-pay

## 2020-03-18 DIAGNOSIS — Z794 Long term (current) use of insulin: Secondary | ICD-10-CM

## 2020-03-18 DIAGNOSIS — M2031 Hallux varus (acquired), right foot: Secondary | ICD-10-CM

## 2020-03-18 DIAGNOSIS — E1140 Type 2 diabetes mellitus with diabetic neuropathy, unspecified: Secondary | ICD-10-CM

## 2020-03-18 DIAGNOSIS — L97512 Non-pressure chronic ulcer of other part of right foot with fat layer exposed: Secondary | ICD-10-CM

## 2020-03-18 DIAGNOSIS — Z9889 Other specified postprocedural states: Secondary | ICD-10-CM

## 2020-03-19 ENCOUNTER — Encounter: Payer: Self-pay | Admitting: Podiatry

## 2020-03-19 NOTE — Progress Notes (Signed)
  Subjective:  Patient ID: Nicholas Whitehead, male    DOB: 02-Jan-1956,  MRN: [id number]  Chief Complaint  Patient presents with  . Routine Post Op    PT stated that he is doing okay he has some pain but no major concerns     DOS: 02/27/2020 Procedure: Right foot IPJ arthrodesis and excision of wound  65 y.o. male returns for post-op check.  Doing well  Review of Systems: Negative except as noted in the HPI. Denies N/V/F/Ch.   Objective:  There were no vitals filed for this visit. There is no height or weight on file to calculate BMI. Constitutional Well developed. Well nourished.  Vascular Foot warm and well perfused. Capillary refill normal to all digits.   Neurologic Normal speech. Oriented to person, place, and time. Epicritic sensation to light touch grossly reduced bilaterally.  Dermatologic Skin healing well without signs of infection. Skin edges well coapted without signs of infection.  Pin sites without drainage or infection  Orthopedic: Tenderness to palpation noted about the surgical site.  Kirschner wires intact   Radiographs: Status post arthrodesis with K wire fixation of the right IPJ hallux Assessment:   1. Status post surgery   2. Hallux malleus of right foot   3. Neuropathic ulcer of right foot with fat layer exposed ([place])   4. Type 2 diabetes mellitus with diabetic neuropathy, with long-term current use of insulin ([place])    Plan:  Patient was evaluated and treated and all questions answered.  S/p foot surgery right -Progressing as expected post-operatively. -XR: As above -WB Status: WBAT in surgical shoe -Sutures: Sutures removed today -Medications: No refills required -Foot redressed. -New x-rays at next visit, hopeful will be able to pull K wires  Return in about 3 weeks (around 04/08/2020).

## 2020-03-23 ENCOUNTER — Other Ambulatory Visit: Payer: Self-pay | Admitting: Internal Medicine

## 2020-03-23 ENCOUNTER — Other Ambulatory Visit: Payer: Medicare Other

## 2020-03-23 DIAGNOSIS — E119 Type 2 diabetes mellitus without complications: Secondary | ICD-10-CM | POA: Diagnosis not present

## 2020-03-23 DIAGNOSIS — I10 Essential (primary) hypertension: Secondary | ICD-10-CM

## 2020-03-30 ENCOUNTER — Other Ambulatory Visit: Payer: Self-pay | Admitting: Internal Medicine

## 2020-03-30 ENCOUNTER — Ambulatory Visit: Payer: Medicare Other | Admitting: Endocrinology

## 2020-03-30 DIAGNOSIS — Z794 Long term (current) use of insulin: Secondary | ICD-10-CM

## 2020-03-30 DIAGNOSIS — F419 Anxiety disorder, unspecified: Secondary | ICD-10-CM

## 2020-03-30 DIAGNOSIS — I10 Essential (primary) hypertension: Secondary | ICD-10-CM

## 2020-03-30 DIAGNOSIS — E1140 Type 2 diabetes mellitus with diabetic neuropathy, unspecified: Secondary | ICD-10-CM

## 2020-03-31 ENCOUNTER — Telehealth: Payer: Self-pay | Admitting: Endocrinology

## 2020-03-31 NOTE — Telephone Encounter (Signed)
MEDICATION: Actos  PHARMACY:  [facility] on [address]  HAS THE PATIENT CONTACTED THEIR PHARMACY?  no  IS THIS A 90 DAY SUPPLY :   IS PATIENT OUT OF MEDICATION: yes  IF NOT; HOW MUCH IS LEFT:   LAST APPOINTMENT DATE: @3 /01/2020  NEXT APPOINTMENT DATE:@6 /02/2020  DO WE HAVE YOUR PERMISSION TO LEAVE A DETAILED MESSAGE?:  OTHER COMMENTS:    **Let patient know to contact pharmacy at the end of the day to make sure medication is ready. **  ** Please notify patient to allow 48-72 hours to process**  **Encourage patient to contact the pharmacy for refills or they can request refills through [facility]**

## 2020-04-01 ENCOUNTER — Other Ambulatory Visit: Payer: Self-pay

## 2020-04-01 ENCOUNTER — Encounter: Payer: Medicare Other | Admitting: Podiatry

## 2020-04-01 DIAGNOSIS — E1140 Type 2 diabetes mellitus with diabetic neuropathy, unspecified: Secondary | ICD-10-CM

## 2020-04-01 DIAGNOSIS — Z794 Long term (current) use of insulin: Secondary | ICD-10-CM

## 2020-04-01 MED ORDER — PIOGLITAZONE HCL 30 MG PO TABS: 30.0000 mg | ORAL_TABLET | Freq: Every day | ORAL | 1 refills | Status: DC

## 2020-04-01 NOTE — Telephone Encounter (Signed)
Rx sent 

## 2020-04-08 ENCOUNTER — Other Ambulatory Visit: Payer: Self-pay

## 2020-04-08 ENCOUNTER — Ambulatory Visit (INDEPENDENT_AMBULATORY_CARE_PROVIDER_SITE_OTHER): Payer: Medicare Other

## 2020-04-08 ENCOUNTER — Encounter: Payer: Self-pay | Admitting: Podiatry

## 2020-04-08 ENCOUNTER — Ambulatory Visit (INDEPENDENT_AMBULATORY_CARE_PROVIDER_SITE_OTHER): Payer: Medicare Other | Admitting: Podiatry

## 2020-04-08 DIAGNOSIS — M2031 Hallux varus (acquired), right foot: Secondary | ICD-10-CM

## 2020-04-08 DIAGNOSIS — Z794 Long term (current) use of insulin: Secondary | ICD-10-CM

## 2020-04-08 DIAGNOSIS — Z9889 Other specified postprocedural states: Secondary | ICD-10-CM

## 2020-04-08 DIAGNOSIS — F17200 Nicotine dependence, unspecified, uncomplicated: Secondary | ICD-10-CM | POA: Diagnosis not present

## 2020-04-08 DIAGNOSIS — E1140 Type 2 diabetes mellitus with diabetic neuropathy, unspecified: Secondary | ICD-10-CM

## 2020-04-08 MED ORDER — CEPHALEXIN 500 MG PO CAPS: 500.0000 mg | ORAL_CAPSULE | Freq: Three times a day (TID) | ORAL | 0 refills | Status: DC

## 2020-04-08 NOTE — Progress Notes (Signed)
  Subjective:  Patient ID: Nicholas Whitehead, male    DOB: [birth year],  MRN: [id number]  Chief Complaint  Patient presents with  . Routine Post Op    PT stated that he is doing okay he is having some pain     DOS: 02/27/2020 Procedure: Right foot IPJ arthrodesis and excision of wound  65 y.o. male returns for post-op check.  Thinks it is taking a while to heal  Review of Systems: Negative except as noted in the HPI. Denies N/V/F/Ch.   Objective:  There were no vitals filed for this visit. There is no height or weight on file to calculate BMI. Constitutional Well developed. Well nourished.  Vascular Foot warm and well perfused. Capillary refill normal to all digits.   Neurologic Normal speech. Oriented to person, place, and time. Epicritic sensation to light touch grossly reduced bilaterally.  Dermatologic  incision well-healed.  There is some serous scabbing around the pin sites  Orthopedic: Tenderness to palpation noted about the surgical site.  Kirschner wires intact   Radiographs: Kirschner wires intact, unclear if the position has changed, there is visibility of the arthrodesis site Assessment:   1. Status post surgery    Plan:  Patient was evaluated and treated and all questions answered.  S/p foot surgery right -He appears to be developing a delayed union, he has multiple risk factors for this.  I think the fixation should remain intact from the 2 weeks for removal -XR: As above -WB Status: WBAT in surgical shoe -Medications: Sent Keflex prescription as a precaution, he had some serous drainage around the pin sites and want to ensure that this not a pin tract infection-New x-rays at next visit, hopeful will be able to pull K wires  Return in about 2 weeks (around 04/22/2020) for new x-rays and then pull wires .

## 2020-04-22 ENCOUNTER — Other Ambulatory Visit: Payer: Self-pay

## 2020-04-22 ENCOUNTER — Ambulatory Visit (INDEPENDENT_AMBULATORY_CARE_PROVIDER_SITE_OTHER): Payer: Medicare Other

## 2020-04-22 ENCOUNTER — Ambulatory Visit (INDEPENDENT_AMBULATORY_CARE_PROVIDER_SITE_OTHER): Payer: Medicare Other | Admitting: Podiatry

## 2020-04-22 DIAGNOSIS — Z9889 Other specified postprocedural states: Secondary | ICD-10-CM | POA: Diagnosis not present

## 2020-04-22 DIAGNOSIS — M2031 Hallux varus (acquired), right foot: Secondary | ICD-10-CM

## 2020-04-22 DIAGNOSIS — F17200 Nicotine dependence, unspecified, uncomplicated: Secondary | ICD-10-CM

## 2020-04-22 DIAGNOSIS — Z794 Long term (current) use of insulin: Secondary | ICD-10-CM

## 2020-04-22 DIAGNOSIS — E119 Type 2 diabetes mellitus without complications: Secondary | ICD-10-CM | POA: Diagnosis not present

## 2020-04-22 DIAGNOSIS — E1140 Type 2 diabetes mellitus with diabetic neuropathy, unspecified: Secondary | ICD-10-CM

## 2020-04-27 ENCOUNTER — Ambulatory Visit (HOSPITAL_COMMUNITY): Payer: Medicare Other | Admitting: Psychiatry

## 2020-04-27 ENCOUNTER — Encounter: Payer: Self-pay | Admitting: Podiatry

## 2020-04-27 NOTE — Progress Notes (Signed)
  Subjective:  Patient ID: Nicholas Whitehead, male    DOB: 1955/01/20,  MRN: [id number]  Chief Complaint  Patient presents with  . Routine Post Op    POV #4 DOS 02/27/2020 WOUND EXCISION OF GREAT TOE JOINT OF RIGHT FOOT, FUSION OF JOINT IN GREAT TOE- denies f/c/n/v-manageable pain levels-no other concerns.     DOS: 02/27/2020 Procedure: Right foot IPJ arthrodesis and excision of wound  65 y.o. male returns for post-op check.  Seems to be improving slowly  Review of Systems: Negative except as noted in the HPI. Denies N/V/F/Ch.   Objective:  There were no vitals filed for this visit. There is no height or weight on file to calculate BMI. Constitutional Well developed. Well nourished.  Vascular Foot warm and well perfused. Capillary refill normal to all digits.   Neurologic Normal speech. Oriented to person, place, and time. Epicritic sensation to light touch grossly reduced bilaterally.  Dermatologic  incision well-healed.  There is some serous scabbing around the pin sites  Orthopedic: Tenderness to palpation noted about the surgical site.  Kirschner wires intact   Radiographs: Kirschner wires intact, no change in position, there is still visibility of the arthrodesis site but seems to have early bridging Assessment:   1. Status post surgery   2. Hallux malleus of right foot   3. Type 2 diabetes mellitus with diabetic neuropathy, with long-term current use of insulin ([place])   4. Current every day smoker    Plan:  Patient was evaluated and treated and all questions answered.  S/p foot surgery right -Advised to quit smoking to allow this to continue to heal -XR: As above, appears to have good bridging of the arthrodesis site beginning -WB Status: WBAT in regular supportive shoe gear when tolerated -No further antibiotics -Kirschner wires removed without incident  Return in about 1 month (around 05/22/2020) for surgery follow-up, new x-rays .

## 2020-04-28 ENCOUNTER — Ambulatory Visit: Payer: Self-pay

## 2020-04-28 ENCOUNTER — Ambulatory Visit: Payer: Medicare Other

## 2020-04-29 ENCOUNTER — Ambulatory Visit (INDEPENDENT_AMBULATORY_CARE_PROVIDER_SITE_OTHER): Payer: Medicare Other | Admitting: Internal Medicine

## 2020-04-29 ENCOUNTER — Encounter: Payer: Self-pay | Admitting: Internal Medicine

## 2020-04-29 ENCOUNTER — Other Ambulatory Visit: Payer: Self-pay

## 2020-04-29 VITALS — BP 138/78 | HR 60 | Temp 98.2°F | Resp 16 | Ht 75.0 in | Wt 196.0 lb

## 2020-04-29 DIAGNOSIS — N400 Enlarged prostate without lower urinary tract symptoms: Secondary | ICD-10-CM

## 2020-04-29 DIAGNOSIS — I5189 Other ill-defined heart diseases: Secondary | ICD-10-CM | POA: Diagnosis not present

## 2020-04-29 DIAGNOSIS — Z0000 Encounter for general adult medical examination without abnormal findings: Secondary | ICD-10-CM | POA: Diagnosis not present

## 2020-04-29 DIAGNOSIS — N1831 Chronic kidney disease, stage 3a: Secondary | ICD-10-CM | POA: Diagnosis not present

## 2020-04-29 DIAGNOSIS — E785 Hyperlipidemia, unspecified: Secondary | ICD-10-CM | POA: Diagnosis not present

## 2020-04-29 DIAGNOSIS — E1140 Type 2 diabetes mellitus with diabetic neuropathy, unspecified: Secondary | ICD-10-CM

## 2020-04-29 DIAGNOSIS — I10 Essential (primary) hypertension: Secondary | ICD-10-CM | POA: Diagnosis not present

## 2020-04-29 DIAGNOSIS — I119 Hypertensive heart disease without heart failure: Secondary | ICD-10-CM | POA: Insufficient documentation

## 2020-04-29 DIAGNOSIS — Z794 Long term (current) use of insulin: Secondary | ICD-10-CM | POA: Diagnosis not present

## 2020-04-29 LAB — MICROALBUMIN / CREATININE URINE RATIO
Creatinine,U: 42.7 mg/dL
Microalb Creat Ratio: 3.4 mg/g (ref 0.0–30.0)
Microalb, Ur: 1.4 mg/dL (ref 0.0–1.9)

## 2020-04-29 LAB — LIPID PANEL
Cholesterol: 118 mg/dL (ref 0–200)
HDL: 45.6 mg/dL (ref >39.00)
LDL Cholesterol: 59 mg/dL (ref 0–99)
NonHDL: 72.7
Total CHOL/HDL Ratio: 3
Triglycerides: 67 mg/dL (ref 0.0–149.0)
VLDL: 13.4 mg/dL (ref 0.0–40.0)

## 2020-04-29 LAB — URINALYSIS, ROUTINE W REFLEX MICROSCOPIC
Bilirubin Urine: NEGATIVE
Hgb urine dipstick: NEGATIVE
Ketones, ur: NEGATIVE
Leukocytes,Ua: NEGATIVE
Nitrite: NEGATIVE
RBC / HPF: NONE SEEN (ref 0–?)
Specific Gravity, Urine: 1.01 (ref 1.000–1.030)
Total Protein, Urine: NEGATIVE
Urine Glucose: NEGATIVE
Urobilinogen, UA: 0.2 (ref 0.0–1.0)
pH: 7.5 (ref 5.0–8.0)

## 2020-04-29 LAB — PSA: PSA: 1.36 ng/mL (ref 0.10–4.00)

## 2020-04-29 NOTE — Progress Notes (Signed)
Subjective:  Patient ID: Naveen Clardy, male    DOB: Sep 02, 1955  Age: 65 y.o. MRN: [id number]  CC: Annual Exam, Diabetes, Hyperlipidemia, Hypertension, and Congestive Heart Failure  This visit occurred during the SARS-CoV-2 public health emergency.  Safety protocols were in place, including screening questions prior to the visit, additional usage of staff PPE, and extensive cleaning of exam room while observing appropriate contact time as indicated for disinfecting solutions.    HPI Bari Handshoe presents for a CPX and f/up.  He has had a few episodes of symptomatic hypoglycemia with his blood sugar drifting down into the 70s.  He denies polys.  He is active and denies any recent episodes of chest pain, shortness of breath, palpitations, edema, or fatigue.  He has a remote history of DOE.  Echocardiogram showed LVH with grade 2 diastolic dysfunction.  He has had no more episodes of DOE.  Outpatient Medications Prior to Visit  Medication Sig Dispense Refill  . albuterol (PROVENTIL HFA;VENTOLIN HFA) 108 (90 Base) MCG/ACT inhaler Inhale 2 puffs into the lungs every 6 (six) hours as needed for wheezing or shortness of breath. 1 Inhaler 5  . Alcohol Swabs (B-D SINGLE USE SWABS REGULAR) PADS USE AS DIRECTED 300 each 1  . aspirin EC 81 MG tablet Take 81 mg by mouth every morning.    Marland Kitchen BEVESPI AEROSPHERE 9-4.8 MCG/ACT AERO INHALE 2 PUFFS INTO THE LUNGS TWO TIMES DAILY 32.1 g 1  . cephALEXin (KEFLEX) 500 MG capsule Take 1 capsule (500 mg total) by mouth 3 (three) times daily. 30 capsule 0  . cetirizine (ZYRTEC) 10 MG tablet Take 10 mg by mouth daily.    . Cholecalciferol 2000 units TABS Take 1 tablet (2,000 Units total) by mouth daily. 90 tablet 1  . DULoxetine (CYMBALTA) 60 MG capsule Take 1 capsule (60 mg total) by mouth daily. 90 capsule 1  . Eszopiclone 3 MG TABS TAKE 1 TABLET BY MOUTH IMMEDIATELY BEFORE BEDTIME 90 tablet 1  . furosemide (LASIX) 20 MG tablet Take 1 tablet (20 mg total) by mouth  2 (two) times daily. 180 tablet 1  . gabapentin (NEURONTIN) 300 MG capsule TAKE 1 CAPSULE BY MOUTH THREE TIMES DAILY 270 capsule 1  . glucose blood (ACCU-CHEK GUIDE) test strip TEST BLOOD SUGAR FOUR TIMES DAILY 400 strip 3  . insulin glargine, 2 Unit Dial, (TOUJEO MAX SOLOSTAR) 300 UNIT/ML Solostar Pen ADMINISTER 86 UNITS UNDER THE SKIN DAILY 6 mL 3  . insulin lispro (HUMALOG KWIKPEN) 100 UNIT/ML KwikPen Inject 25-30 Units into the skin 3 (three) times daily. 30 mL 3  . insulin lispro (HUMALOG) 100 UNIT/ML KwikPen Inject 25-30 Units into the skin 3 (three) times daily. Inject 25-30 units three times a day with meals    . Insulin Pen Needle (DROPLET PEN NEEDLES) 31G X 8 MM MISC USE TO INJECT INSULIN 500 each 1  . Lancets MISC Use to test blood sugar tid. DX: E11.8 300 each 3  . linaclotide (LINZESS) 72 MCG capsule Take 1 capsule (72 mcg total) by mouth daily before breakfast. 90 capsule 1  . metFORMIN (GLUCOPHAGE-XR) 500 MG 24 hr tablet TAKE 2 TABLETS TWICE DAILY 360 tablet 1  . Nebivolol HCl 20 MG TABS TAKE 1 TABLET(20 MG) BY MOUTH DAILY 90 tablet 0  . NOVOLOG FLEXPEN 100 UNIT/ML FlexPen ADMINISTER 25 TO 30 UNITS UNDER THE SKIN THREE TIMES DAILY BEFORE MEALS. MAY TAKE LESS IF SUGAR IS BELOW 120 OR EATING A SMALL MEAL 30 mL 1  .  ODEFSEY 200-25-25 MG TABS tablet TAKE 1 TABLET BY MOUTH EVERY DAY 30 tablet 0  . olmesartan (BENICAR) 40 MG tablet TAKE 1 TABLET(40 MG) BY MOUTH DAILY 90 tablet 1  . OZEMPIC, 0.25 OR 0.5 MG/DOSE, 2 MG/1.5ML SOPN START WITH INJECTING 0.25 MG UNDER THE SKIN WEEKLY FOR 4 WEEKS, THEN INJECT 0.5 MG UNDER THE SKIN ONCE A WEEK 1.5 mL 5  . pioglitazone (ACTOS) 30 MG tablet Take 1 tablet (30 mg total) by mouth daily. 90 tablet 1  . Pitavastatin Calcium (LIVALO) 1 MG TABS TAKE 1 TABLET(1 MG) BY MOUTH DAILY 90 tablet 1  . traZODone (DESYREL) 150 MG tablet TAKE 3 TABLETS(450 MG) BY MOUTH AT BEDTIME 270 tablet 1  . trazodone (DESYREL) 300 MG tablet TAKE 1 TABLET AT BEDTIME 90 tablet 1   . triamcinolone cream (KENALOG) 0.5 % APPLY 1 APPLICATION TOPICALLY THREE TIME A DAY 100 g 3   No facility-administered medications prior to visit.    ROS Review of Systems  Constitutional: Negative.  Negative for appetite change, diaphoresis, fatigue and unexpected weight change.  HENT: Negative.   Eyes: Negative.   Respiratory: Negative.  Negative for cough, chest tightness, shortness of breath and wheezing.   Cardiovascular: Negative for chest pain, palpitations and leg swelling.  Gastrointestinal: Negative for abdominal pain, diarrhea, nausea and vomiting.  Endocrine: Negative.   Genitourinary: Negative.  Negative for difficulty urinating, genital sores, hematuria, scrotal swelling, testicular pain and urgency.  Musculoskeletal: Negative for arthralgias, back pain and myalgias.  Skin: Negative.  Negative for color change.  Neurological: Negative.  Negative for dizziness, weakness and headaches.  Hematological: Negative for adenopathy. Does not bruise/bleed easily.  Psychiatric/Behavioral: Negative.     Objective:  BP 138/78 (BP Location: Left Arm, Patient Position: Sitting, Cuff Size: Large)   Pulse 60   Temp 98.2 F (36.8 C) (Oral)   Resp 16   Ht 6\' 3"  (1.905 m)   Wt 196 lb (88.9 kg)   SpO2 95%   BMI 24.50 kg/m   BP Readings from Last 3 Encounters:  04/29/20 138/78  03/09/20 (!) 156/92  01/19/20 136/82    Wt Readings from Last 3 Encounters:  04/29/20 196 lb (88.9 kg)  03/09/20 198 lb (89.8 kg)  01/19/20 204 lb (92.5 kg)    Physical Exam Vitals reviewed. Exam conducted with a chaperone present.  Constitutional:      Appearance: Normal appearance.  HENT:     Nose: Nose normal.     Mouth/Throat:     Mouth: Mucous membranes are moist.  Eyes:     General: No scleral icterus.    Conjunctiva/sclera: Conjunctivae normal.  Cardiovascular:     Rate and Rhythm: Normal rate and regular rhythm.     Heart sounds: No murmur heard.   Pulmonary:     Effort:  Pulmonary effort is normal.     Breath sounds: No stridor. No wheezing, rhonchi or rales.  Abdominal:     General: Abdomen is flat.     Palpations: There is no mass.     Tenderness: There is no abdominal tenderness. There is no guarding.     Hernia: There is no hernia in the left inguinal area or right inguinal area.  Genitourinary:    Pubic Area: No rash.      Penis: Normal and circumcised.      Testes: Normal.     Epididymis:     Right: Normal.     Left: Normal.     Prostate: Enlarged (  1+ smooth symm BPH). Not tender and no nodules present.     Rectum: Normal. Guaiac result negative. No mass, tenderness, anal fissure, external hemorrhoid or internal hemorrhoid. Normal anal tone.  Musculoskeletal:        General: Normal range of motion.     Cervical back: Neck supple.     Right lower leg: No edema.     Left lower leg: No edema.  Lymphadenopathy:     Cervical: No cervical adenopathy.     Lower Body: No right inguinal adenopathy. No left inguinal adenopathy.  Skin:    General: Skin is warm and dry.     Coloration: Skin is not pale.     Findings: No rash.  Neurological:     General: No focal deficit present.     Mental Status: He is alert.  Psychiatric:        Mood and Affect: Mood normal.        Behavior: Behavior normal.     Lab Results  Component Value Date   WBC 7.0 01/12/2020   HGB 14.4 01/12/2020   HCT 42.4 01/12/2020   PLT 160 01/12/2020   GLUCOSE 111 (H) 02/20/2020   CHOL 118 04/29/2020   TRIG 67.0 04/29/2020   HDL 45.60 04/29/2020   LDLCALC 59 04/29/2020   ALT 22 02/20/2020   AST 22 02/20/2020   NA 141 02/20/2020   K 4.0 02/20/2020   CL 101 02/20/2020   CREATININE 1.01 02/20/2020   BUN 10 02/20/2020   CO2 35 (H) 02/20/2020   TSH 1.45 01/19/2020   PSA 1.36 04/29/2020   HGBA1C 6.7 (H) 02/20/2020   MICROALBUR 1.4 04/29/2020    MR FOOT RIGHT WO CONTRAST  Result Date: 01/16/2020 CLINICAL DATA:  Right foot ulceration EXAM: MRI OF THE RIGHT FOREFOOT  WITHOUT CONTRAST TECHNIQUE: Multiplanar, multisequence MR imaging of the right forefoot was performed. No intravenous contrast was administered. COMPARISON:  X-ray 12/23/2019 FINDINGS: Bones/Joint/Cartilage Hallux malleus deformity of the right great toe. There is preservation of the fatty bone marrow signal within the proximal and distal phalanx of the great toe. No bone marrow edema. No bony erosion. No joint effusion of the great toe IP joint. Slight flexion deformities of the second-fifth toes. Pes planus alignment with partial midfoot collapse. Overall, joint spaces are relatively well preserved. No significant joint effusion, synovitis, or erosion. Ligaments Intact Lisfranc ligament. Collateral ligaments of the forefoot appear intact. Muscles and Tendons Atrophy and fatty infiltration of the intrinsic foot musculature. Diffuse edema-like signal throughout the visualized right foot musculature. Flexor and extensor tendons are intact. No tenosynovitis. Soft tissues There is subtle skin irregularity over the dorsal aspect of the great toe at the level of the IP joint (series 5, image 37), likely reflecting patient's reported ulceration. No underlying fluid collection or abscess. There is mild diffuse subcutaneous edema throughout the forefoot. No organized fluid collections. IMPRESSION: 1. No MRI evidence of acute osteomyelitis of the right great toe. 2. Subtle skin irregularity over the dorsal aspect of the great toe at the level of the IP joint likely reflecting patient's reported ulceration. No underlying fluid collection or abscess. 3. Diffuse edema-like signal throughout the visualized right foot musculature suggesting denervation changes versus a nonspecific myositis. 4. Hallux malleus deformity of the right great toe. Slight flexion deformities of the second-fifth toes. Electronically Signed   By: Davina Poke D.O.   On: 01/16/2020 16:55   Sonographer:    [place] Referring Phys: [place]  Hadja Harral  IMPRESSIONS    1. Left ventricular ejection fraction, by estimation, is 55 to 60%. Left ventricular ejection fraction by 3D volume is 60 %. The left ventricle has normal function. The left ventricle has no regional wall motion abnormalities. There is mild concentric  left ventricular hypertrophy. Left ventricular diastolic parameters are consistent with Grade II diastolic dysfunction (pseudonormalization). Elevated left atrial pressure.  2. Right ventricular systolic function is normal. The right ventricular size is normal. Tricuspid regurgitation signal is inadequate for assessing PA pressure.  3. Left atrial size was mildly dilated.  4. A small pericardial effusion is present. The pericardial effusion is circumferential. There is no evidence of cardiac tamponade.  5. The mitral valve is grossly normal. Trivial mitral valve regurgitation. No evidence of mitral stenosis.  6. The aortic valve is tricuspid. Aortic valve regurgitation is not visualized. No aortic stenosis is present.  7. The inferior vena cava is normal in size with greater than 50% respiratory variability, suggesting right atrial pressure of 3 mmHg.   Assessment & Plan:   Cardale was seen today for annual exam, diabetes, hyperlipidemia, hypertension and congestive heart failure.  Diagnoses and all orders for this visit:  Primary hypertension- His blood pressure is adequately well controlled. -     Urinalysis, Routine w reflex microscopic; Future -     Urinalysis, Routine w reflex microscopic  Type 2 diabetes mellitus with diabetic neuropathy, with long-term current use of insulin ([place])- His blood sugar is adequately well controlled. -     Microalbumin / creatinine urine ratio; Future -     Microalbumin / creatinine urine ratio -     Glucagon (GVOKE HYPOPEN 2-PACK) 1 MG/0.2ML SOAJ; Inject 1 Act into the skin daily as needed.  Benign prostatic hyperplasia without lower urinary tract symptoms- He has a low  PSA which is a reassuring sign that he does not have prostate cancer.  He has no symptoms that need to be treated. -     PSA; Future -     PSA  Stage 3a chronic kidney disease ([place])- His renal function is normal.  He will avoid nephrotoxic agents. -     Urinalysis, Routine w reflex microscopic; Future -     Urinalysis, Routine w reflex microscopic  Hyperlipidemia LDL goal <100- He has achieved his LDL goal and is doing well on the statin. -     Lipid panel; Future -     Lipid panel  Routine general medical examination at a health care facility- Exam completed, labs reviewed, vaccines reviewed, cancer screenings are up-to-date, patient education was given.  Hypertensive left ventricular hypertrophy, without heart failure- His blood pressure is adequately well controlled.  Grade II diastolic dysfunction- He has a normal volume status.  Will continue the loop diuretic.   I am having Debbe Bales start on Gvoke HypoPen 2-Pack. I am also having him maintain his cetirizine, aspirin EC, albuterol, Cholecalciferol, Lancets, linaclotide, Accu-Chek Guide, triamcinolone cream, metFORMIN, trazodone, Bevespi Aerosphere, B-D SINGLE USE SWABS REGULAR, NovoLOG FlexPen, insulin lispro, Eszopiclone, Droplet Pen Needles, Toujeo Max SoloStar, Mondamin, DULoxetine, Livalo, furosemide, Ozempic (0.25 or 0.5 MG/DOSE), insulin lispro, Nebivolol HCl, gabapentin, olmesartan, traZODone, pioglitazone, and cephALEXin.  Meds ordered this encounter  Medications  . Glucagon (GVOKE HYPOPEN 2-PACK) 1 MG/0.2ML SOAJ    Sig: Inject 1 Act into the skin daily as needed.    Dispense:  2 mL    Refill:  6     Follow-up: Return in about 6 months (around 10/29/2020).  Scarlette Calico, MD

## 2020-04-29 NOTE — Patient Instructions (Signed)

## 2020-04-30 ENCOUNTER — Encounter: Payer: Self-pay | Admitting: Internal Medicine

## 2020-04-30 MED ORDER — GVOKE HYPOPEN 2-PACK 1 MG/0.2ML ~~LOC~~ SOAJ: 1.0000 | Freq: Every day | SUBCUTANEOUS | 6 refills | Status: DC | PRN

## 2020-05-20 ENCOUNTER — Ambulatory Visit (INDEPENDENT_AMBULATORY_CARE_PROVIDER_SITE_OTHER): Payer: Medicare Other | Admitting: Pharmacist

## 2020-05-20 DIAGNOSIS — Z794 Long term (current) use of insulin: Secondary | ICD-10-CM | POA: Diagnosis not present

## 2020-05-20 DIAGNOSIS — F32A Depression, unspecified: Secondary | ICD-10-CM

## 2020-05-20 DIAGNOSIS — E1140 Type 2 diabetes mellitus with diabetic neuropathy, unspecified: Secondary | ICD-10-CM

## 2020-05-20 DIAGNOSIS — E785 Hyperlipidemia, unspecified: Secondary | ICD-10-CM | POA: Diagnosis not present

## 2020-05-20 DIAGNOSIS — I10 Essential (primary) hypertension: Secondary | ICD-10-CM | POA: Diagnosis not present

## 2020-05-20 DIAGNOSIS — F419 Anxiety disorder, unspecified: Secondary | ICD-10-CM

## 2020-05-20 NOTE — Progress Notes (Signed)
Chronic Care Management Pharmacy Note  05/21/2020 Name:  Nicholas Whitehead MRN:  [id number] DOB:  Mar 19, 1955  Subjective: Nicholas Whitehead is an 65 y.o. year old male who is a primary patient of [name], MD.  The CCM team was consulted for assistance with disease management and care coordination needs.    Engaged with patient by telephone for follow up visit in response to provider referral for pharmacy case management and/or care coordination services.   Consent to Services:  The patient was given information about Chronic Care Management services, agreed to services, and gave verbal consent prior to initiation of services.  Please see initial visit note for detailed documentation.   Patient Care Team: [name], MD as PCP - General (Internal Medicine) [name], Okey Regal, MD as PCP - Infectious Diseases (Infectious Diseases) Merita Norton, LCSW as Counselor Lenord Fellers, Cleaster Corin, [facility] as Pharmacist (Pharmacist) Calvert Cantor, MD as Consulting Physician (Ophthalmology)  Recent office visits: 04/29/20 Dr [name] OV: CPX. C/o hypoglycemia in 70s. Rx'd glucagon.  01/19/20 Dr [name] OV: c/o LE edema, had stoped loop diuretic. Advised to restart furosemide and ordered ECHO.  09/22/19 Dr [name] OV: dc'd HCTZ due to BP down to 118. Refilled gabapentin.   03/20/19 Dr [name] OV: chronic f/u; try Livalo for high ASCVD risk.  Recent consult visits: 03/09/20 Dr Dwyane Dee (endocrine): f/u DM; counseled Freestyle Libre 01/29/20 Dr Sherryle Lis (podiatry): planning foot surgery for February.  01/26/20 Dr Linus Salmons (ID): f/u HIV, doing well. Pt c/o depression, referred to psych at patient's request.  09/18/19 Dr Dwyane Dee (endcorine): DM dx 1998. Insulin since 2006. BG higher than expected based on A1c but fructosamine indicates improved control on Ozempic. Advised to take Novolog 25 min prior to meals. Increased Toujeo to 90 units.  07/10/19 Dr Dwyane Dee (endcorine): given new glucometer. Go back to Ozempic 0.25 mg weekly.  Reduce Novolog to 25 units if BG is low-normal <100  07/02/19 Dr Linus Salmons (ID): HIV f/u, labs normal, no med changes. Advised counseling for depression but pt declined.  Hospital visits: None in previous 6 months  Objective:  Lab Results  Component Value Date   CREATININE 1.01 02/20/2020   BUN 10 02/20/2020   GFR 79.82 02/20/2020   GFRNONAA 77 01/12/2020   GFRAA 90 01/12/2020   NA 141 02/20/2020   K 4.0 02/20/2020   CALCIUM 10.0 02/20/2020   CO2 35 (H) 02/20/2020    Lab Results  Component Value Date/Time   HGBA1C 6.7 (H) 02/20/2020 07:55 AM   HGBA1C 6.4 12/17/2019 08:54 AM   FRUCTOSAMINE 280 09/11/2019 08:12 AM   FRUCTOSAMINE 298 (H) 04/28/2019 11:26 AM   GFR 79.82 02/20/2020 07:55 AM   GFR 69.12 12/17/2019 08:54 AM   MICROALBUR 1.4 04/29/2020 02:17 PM   MICROALBUR 1.0 02/19/2019 10:31 AM    Last diabetic Eye exam:  Lab Results  Component Value Date/Time   HMDIABEYEEXA No Retinopathy 06/25/2019 12:00 AM    Last diabetic Foot exam:  Lab Results  Component Value Date/Time   HMDIABFOOTEX done 05/21/2013 12:00 AM     Lab Results  Component Value Date   CHOL 118 04/29/2020   HDL 45.60 04/29/2020   LDLCALC 59 04/29/2020   TRIG 67.0 04/29/2020   CHOLHDL 3 04/29/2020    Hepatic Function Latest Ref Rng & Units 02/20/2020 01/12/2020 03/18/2018  Total Protein 6.0 - 8.3 g/dL 8.1 7.5 7.6  Albumin 3.5 - 5.2 g/dL 4.4 - -  AST 0 - 37 U/L _0 ALT 0 -  53 U/L 22 17 13  Alk Phosphatase 39 - 117 U/L 35(L) - -  Total Bilirubin 0.2 - 1.2 mg/dL 0.3 0.2 0.3    Lab Results  Component Value Date/Time   TSH 1.45 01/19/2020 10:43 AM   TSH 1.21 03/20/2019 12:30 PM    CBC Latest Ref Rng & Units 01/12/2020 03/25/2019 03/18/2018  WBC 3.8 - 10.8 Thousand/uL 7.0 7.3 5.6  Hemoglobin 13.2 - 17.1 g/dL 14.4 15.6 15.3  Hematocrit 38.5 - 50.0 % 42.4 45.5 44.4  Platelets 140 - 400 Thousand/uL 160 189.0 166    Lab Results  Component Value Date/Time   VD25OH 56.40 03/20/2019 12:30 PM    VD25OH 34.66 10/23/2017 10:33 AM    Clinical ASCVD: No  The ASCVD Risk score (Goff DC Jr., et al., 2013) failed to calculate for the following reasons:   The valid total cholesterol range is 130 to 320 mg/dL    Depression screen PHQ 2/9 10/27/2019 10/27/2019 07/02/2019  Decreased Interest 0 0 0  Down, Depressed, Hopeless 1 1 3  PHQ - 2 Score 1 1 3  Altered sleeping - - -  Tired, decreased energy - - -  Change in appetite - - -  Feeling bad or failure about yourself  - - -  Trouble concentrating - - -  Moving slowly or fidgety/restless - - -  Suicidal thoughts - - -  PHQ-9 Score - - -  Difficult doing work/chores - - -  Some recent data might be hidden    Social History   Tobacco Use  Smoking Status Current Every Day Smoker  . Packs/day: 0.25  . Types: Cigarettes  Smokeless Tobacco Never Used   BP Readings from Last 3 Encounters:  04/29/20 138/78  03/09/20 (!) 156/92  01/19/20 136/82   Pulse Readings from Last 3 Encounters:  04/29/20 60  03/09/20 (!) 58  01/19/20 (!) 58   Wt Readings from Last 3 Encounters:  04/29/20 196 lb (88.9 kg)  03/09/20 198 lb (89.8 kg)  01/19/20 204 lb (92.5 kg)   BMI Readings from Last 3 Encounters:  04/29/20 24.50 kg/m  03/09/20 24.75 kg/m  01/19/20 26.91 kg/m     Assessment/Interventions: Review of patient past medical history, allergies, medications, health status, including review of consultants reports, laboratory and other test data, was performed as part of comprehensive evaluation and provision of chronic care management services.   SDOH:  (Social Determinants of Health) assessments and interventions performed: Yes   SDOH Screenings   Alcohol Screen: Low Risk   . Last Alcohol Screening Score (AUDIT): 0  Depression (PHQ2-9): Low Risk   . PHQ-2 Score: 1  Financial Resource Strain: Low Risk   . Difficulty of Paying Living Expenses: Not hard at all  Food Insecurity: No Food Insecurity  . Worried About Running Out of Food  in the Last Year: Never true  . Ran Out of Food in the Last Year: Never true  Housing: Low Risk   . Last Housing Risk Score: 0  Physical Activity: Inactive  . Days of Exercise per Week: 0 days  . Minutes of Exercise per Session: 0 min  Social Connections: Socially Isolated  . Frequency of Communication with Friends and Family: More than three times a week  . Frequency of Social Gatherings with Friends and Family: More than three times a week  . Attends Religious Services: Never  . Active Member of Clubs or Organizations: No  . Attends Club or Organization Meetings: Never  . Marital Status:   Never married  Stress: No Stress Concern Present  . Feeling of Stress : Not at all  Tobacco Use: High Risk  . Smoking Tobacco Use: Current Every Day Smoker  . Smokeless Tobacco Use: Never Used  Transportation Needs: No Transportation Needs  . Lack of Transportation (Medical): No  . Lack of Transportation (Non-Medical): No     CCM Care Plan  Allergies  Allergen Reactions  . Benazepril Cough  . Molds & Smuts     Hard to breathe  . Pravastatin Other (See Comments)    myositis  . Glipizide Nausea Only    Medications Reviewed Today    Reviewed by Charlton Haws, [facility] (Pharmacist) on 05/21/20 at 1450  Med List Status: <None>  Medication Order Taking? Sig Documenting Provider Last Dose Status Informant  albuterol (PROVENTIL HFA;VENTOLIN HFA) 108 (90 Base) MCG/ACT inhaler [id number] Yes Inhale 2 puffs into the lungs every 6 (six) hours as needed for wheezing or shortness of breath. [name], MD Taking Active            Med Note Iva Lento, Octavio Graves   Fri Mar 01, 2018 12:09 PM)    Alcohol Swabs (B-D SINGLE USE SWABS REGULAR) PADS [id number] Yes USE AS DIRECTED [name], MD Taking Active   aspirin EC 81 MG tablet [id number] Yes Take 81 mg by mouth every morning. [provider] Taking Active Self           Med Note Arnette Schaumann Oct 25, 2016 11:45 AM)    BEVESPI  AEROSPHERE 9-4.8 MCG/ACT AERO [id number] Yes INHALE 2 PUFFS INTO THE LUNGS TWO TIMES DAILY [name], MD Taking Active   cephALEXin (KEFLEX) 500 MG capsule [id number] Yes Take 1 capsule (500 mg total) by mouth 3 (three) times daily. [name], DPM Taking Active   cetirizine (ZYRTEC) 10 MG tablet [id number] Yes Take 10 mg by mouth daily. [provider] Taking Active Self           Med Note Arnette Schaumann Oct 25, 2016 11:45 AM)    Cholecalciferol 2000 units TABS [id number] Yes Take 1 tablet (2,000 Units total) by mouth daily. [name], MD Taking Active   DULoxetine (CYMBALTA) 60 MG capsule [id number] Yes Take 1 capsule (60 mg total) by mouth daily. [name], MD Taking Active   Eszopiclone 3 MG TABS [id number] Yes TAKE 1 TABLET BY MOUTH IMMEDIATELY BEFORE BEDTIME [name], MD Taking Active   furosemide (LASIX) 20 MG tablet [id number] Yes Take 1 tablet (20 mg total) by mouth 2 (two) times daily. [name], MD Taking Active   gabapentin (NEURONTIN) 300 MG capsule [id number] Yes TAKE 1 CAPSULE BY MOUTH THREE TIMES DAILY [name], MD Taking Active   Glucagon (GVOKE HYPOPEN 2-PACK) 1 MG/0.2ML SOAJ [id number] Yes Inject 1 Act into the skin daily as needed. [name], MD Taking Active   glucose blood (ACCU-CHEK GUIDE) test strip [id number] Yes TEST BLOOD SUGAR FOUR TIMES DAILY [name], MD Taking Active   insulin glargine, 2 Unit Dial, (TOUJEO MAX SOLOSTAR) 300 UNIT/ML Solostar Pen [id number] Yes ADMINISTER 86 UNITS UNDER THE SKIN DAILY Elayne Snare, MD Taking Active   insulin lispro (HUMALOG KWIKPEN) 100 UNIT/ML KwikPen [id number] Yes Inject 25-30 Units into the skin 3 (three) times daily. Elayne Snare, MD Taking Active   Insulin Pen Needle (DROPLET PEN NEEDLES) 31G X 8 MM MISC [id number] Yes USE TO  INJECT INSULIN Jones, Thomas L, MD Taking Active   Lancets MISC [id number] Yes Use to test blood sugar tid. DX: E11.8 Jones, Thomas L, MD Taking Active    linaclotide (LINZESS) 72 MCG capsule [id number] Yes Take 1 capsule (72 mcg total) by mouth daily before breakfast. Jones, Thomas L, MD Taking Active   metFORMIN (GLUCOPHAGE-XR) 500 MG 24 hr tablet [id number] Yes TAKE 2 TABLETS TWICE DAILY Jones, Thomas L, MD Taking Active   Nebivolol HCl 20 MG TABS [id number] Yes TAKE 1 TABLET(20 MG) BY MOUTH DAILY Jones, Thomas L, MD Taking Active   ODEFSEY 200-25-25 MG TABS tablet [id number] Yes TAKE 1 TABLET BY MOUTH EVERY DAY Campbell, John, MD Taking Active   olmesartan (BENICAR) 40 MG tablet [id number] Yes TAKE 1 TABLET(40 MG) BY MOUTH DAILY Jones, Thomas L, MD Taking Active   OZEMPIC, 0.25 OR 0.5 MG/DOSE, 2 MG/1.5ML SOPN [id number] Yes START WITH INJECTING 0.25 MG UNDER THE SKIN WEEKLY FOR 4 WEEKS, THEN INJECT 0.5 MG UNDER THE SKIN ONCE A WEEK Kumar, Ajay, MD Taking Active   pioglitazone (ACTOS) 30 MG tablet [id number] Yes Take 1 tablet (30 mg total) by mouth daily. Kumar, Ajay, MD Taking Active   Pitavastatin Calcium (LIVALO) 1 MG TABS [id number] Yes TAKE 1 TABLET(1 MG) BY MOUTH DAILY Jones, Thomas L, MD Taking Active   traZODone (DESYREL) 150 MG tablet [id number] Yes TAKE 3 TABLETS(450 MG) BY MOUTH AT BEDTIME Jones, Thomas L, MD Taking Active   trazodone (DESYREL) 300 MG tablet [id number] Yes TAKE 1 TABLET AT BEDTIME Jones, Thomas L, MD Taking Active   triamcinolone cream (KENALOG) 0.5 % [id number] Yes APPLY 1 APPLICATION TOPICALLY THREE TIME A DAY [name], Robert W, MD Taking Active           Patient Active Problem List   Diagnosis Date Noted  . Hypertensive left ventricular hypertrophy, without heart failure 04/29/2020  . Grade II diastolic dysfunction 04/29/2020  . Bilateral leg edema 01/19/2020  . Eczema 07/02/2019  . Chronic renal disease, stage 3, moderately decreased glomerular filtration rate (GFR) between 30-59 mL/min/1.73 square meter 06/25/2018  . Vitamin D deficiency 04/04/2017  . Uncontrolled type 2 diabetes mellitus with hyperglycemia, with  long-term current use of insulin (HCC) 04/04/2017  . Tobacco abuse 12/28/2016  . Chronic idiopathic constipation 10/24/2016  . Insomnia due to anxiety and fear 08/22/2016  . Intermittent claudication (HCC) 02/07/2016  . Drug-induced erectile dysfunction 09/06/2015  . Simple chronic bronchitis (HCC) 05/06/2015  . Mild cognitive impairment 05/06/2015  . Type 2 diabetes mellitus with diabetic neuropathy, with long-term current use of insulin (HCC) 04/30/2015  . Nonspecific abnormal electrocardiogram (ECG) (EKG) 03/25/2015  . Screening examination for venereal disease 08/12/2013  . Routine general medical examination at a health care facility 05/21/2013  . BPH (benign prostatic hyperplasia) 05/21/2013  . 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 Anxiety and depression 04/18/2011  . Hyperlipidemia LDL goal <100   . Diabetes mellitus with diabetic neuropathy (HCC)   . Hypertension   . HIV disease (HCC) 09/12/1995    Immunization History  Administered Date(s) Administered  . Hepatitis A 01/29/2002  . Influenza Split 09/26/2011  . Influenza Whole 10/14/2001, 09/02/2010  . Influenza, High Dose Seasonal PF 09/06/2015  . Influenza,inj,Quad PF,6+ Mos 09/19/2012, 09/11/2013, 09/22/2014, 03/02/2015, 10/24/2016, 10/23/2017, 09/25/2018, 12/24/2019  . PFIZER(Purple Top)SARS-COV-2 Vaccination 09/30/2019, 10/21/2019  . PPD Test 09/14/1995  . Pneumococcal Conjugate-13 01/21/2014  . Pneumococcal Polysaccharide-23 12/16/2001, 09/02/2010, 09/06/2015  . Tdap 05/21/2013    Conditions to be addressed/monitored:  Hypertension,   Hyperlipidemia, Diabetes, Depression and Anxiety  Patient Care Plan: CCM Pharmacy Care Plan    Problem Identified: Hypertension, Hyperlipidemia, Diabetes, Depression and Anxiety   Priority: High    Goal: Disease management   Start Date: 02/20/2020  Expected End Date: 08/19/2020  This Visit's Progress: On track  Recent Progress: On track  Priority: High  Note:   Current Barriers:   . Unable to independently monitor therapeutic efficacy  Pharmacist Clinical Goal(s):  . Patient will achieve adherence to monitoring guidelines and medication adherence to achieve therapeutic efficacy through collaboration with PharmD and provider.   Interventions: . 1:1 collaboration with Jones, Thomas L, MD regarding development and update of comprehensive plan of care as evidenced by provider attestation and co-signature . Inter-disciplinary care team collaboration (see longitudinal plan of care) . Comprehensive medication review performed; medication list updated in electronic medical record  Hypertension (BP goal < 130/80) Controlled - pt endorses compliance and denies issues Current regimen:  ? Bystolic 20 mg daily ? Olmesartan 40 mg daily ? Furosemide 20 mg twice a day Interventions: ? Discussed BP goals and benefits of medications for prevention of heart attack / stroke ? Recommend to continue current medication   Hyperlipidemia (LDL goal < 100) Controlled - LDL is at goal; pt endorses compliance and denies issues Current regimen:  ? Livalo 1 mg daily ? Aspirin 81 mg daily Interventions: ? Discussed cholesterol goals and benefits of medications for prevention of heart attack / stroke ? Recommend to continue current medication   Diabetes (A1c goal < 7%) Controlled - most recent A1c 6.7, however pt c/o of some lows. He recently started Freestyle Libre but has had trouble with sensors fallings off so he is not currently wearing it. Current regimen (per Dr Kumar):  ? Toujeo Max 86 units daily ? Novolog 25-30 units before meals ? Metformin ER 500 mg - 2 tab twice a day ? Pioglitazone 30 mg daily ? Ozempic 0.25 mg weekly ? Freestyle Libre 2 Interventions: ? Discussed blood sugar goals and benefits of medications for prevention of diabetic complications ? Counseled on Freestyle Libre application technique; advised to try Tegaderm over sensor to make it more  secure ? Recommend to continue current medication   Depression / Insomnia Improved - pt is in process of enrolling with psychiatry Current regimen:  ? Duloxetine 60 mg daily ? Trazodone 450 mg at bedtime ? Eszoplicone 3 mg at bedtime Interventions: ? Discussed benefits and safety risks with medications ? Pt is awaiting scheduling with psychiatry ? Recommend to continue current medication  Patient Goals/Self-Care Activities . Patient will:  - take medications as prescribed focus on medication adherence by pill box check glucose daily via Freestyle Libre, document, and provide at future appointments  Follow Up Plan: Telephone follow up appointment with care management team member scheduled for: 3 months     Medication Assistance: None required.  Patient affirms current coverage meets needs.  Patient's preferred pharmacy is:  [id number] - [place], [place] - [address] AT [address] [place] [address] Phone: [phone] Fax: [phone]  [id number] - [place], [place] - [address] AT [address] [place] [address] Phone: [phone] Fax: [phone]  [id number] - [place], [place] - [address] [place] [address] Phone: [phone] Fax: [phone]  Uses pill box? Yes Pt endorses 100% compliance  We discussed:   Current pharmacy is preferred with insurance plan and patient is satisfied with pharmacy services Patient decided to: Continue current medication management strategy  Care Plan and Follow Up Patient Decision:  Patient agrees to Care Plan and Follow-up.  Plan: Telephone follow up appointment with care management team member scheduled for:  3 months   , PharmD, BCACP, CPP Clinical Pharmacist [facility] [phone]  

## 2020-05-21 ENCOUNTER — Other Ambulatory Visit: Payer: Self-pay

## 2020-05-21 NOTE — Patient Instructions (Signed)
Visit Information  Phone number for Pharmacist: [phone]  Goals Addressed            This Visit's Progress   . Manage My Medicine       Timeframe:  Long-Range Goal Priority:  High Start Date:   02/20/20                          Expected End Date:   12/08/20                Follow Up Date 09/07/20   - call for medicine refill 2 or 3 days before it runs out - call if I am sick and can't take my medicine - keep a list of all the medicines I take; vitamins and herbals too - use a pillbox to sort medicine  -Try Tegaderm over Freestyle Libre sensor    Why is this important?   . These steps will help you keep on track with your medicines.       The patient verbalized understanding of instructions, educational materials, and care plan provided today and declined offer to receive copy of patient instructions, educational materials, and care plan.  Telephone follow up appointment with pharmacy team member scheduled for:  Charlene Brooke, PharmD, BCACP, CPP Clinical Pharmacist [facility] [place] [phone]

## 2020-05-22 DIAGNOSIS — E119 Type 2 diabetes mellitus without complications: Secondary | ICD-10-CM | POA: Diagnosis not present

## 2020-05-25 ENCOUNTER — Encounter: Payer: Self-pay | Admitting: Podiatry

## 2020-05-25 ENCOUNTER — Ambulatory Visit (INDEPENDENT_AMBULATORY_CARE_PROVIDER_SITE_OTHER): Payer: Medicare Other

## 2020-05-25 ENCOUNTER — Ambulatory Visit (INDEPENDENT_AMBULATORY_CARE_PROVIDER_SITE_OTHER): Payer: Medicare Other | Admitting: Podiatry

## 2020-05-25 ENCOUNTER — Other Ambulatory Visit: Payer: Self-pay

## 2020-05-25 DIAGNOSIS — M79675 Pain in left toe(s): Secondary | ICD-10-CM | POA: Diagnosis not present

## 2020-05-25 DIAGNOSIS — M79674 Pain in right toe(s): Secondary | ICD-10-CM | POA: Diagnosis not present

## 2020-05-25 DIAGNOSIS — B351 Tinea unguium: Secondary | ICD-10-CM

## 2020-05-25 DIAGNOSIS — L309 Dermatitis, unspecified: Secondary | ICD-10-CM | POA: Diagnosis not present

## 2020-05-25 DIAGNOSIS — L97512 Non-pressure chronic ulcer of other part of right foot with fat layer exposed: Secondary | ICD-10-CM | POA: Diagnosis not present

## 2020-05-25 DIAGNOSIS — M2031 Hallux varus (acquired), right foot: Secondary | ICD-10-CM

## 2020-05-25 DIAGNOSIS — Z9889 Other specified postprocedural states: Secondary | ICD-10-CM

## 2020-05-25 MED ORDER — CLOTRIMAZOLE-BETAMETHASONE 1-0.05 % EX CREA: 1.0000 "application " | TOPICAL_CREAM | Freq: Two times a day (BID) | CUTANEOUS | 2 refills | Status: DC

## 2020-05-25 NOTE — Progress Notes (Signed)
  Subjective:  Patient ID: Nicholas Whitehead, male    DOB: 05-22-1955,  MRN: [id number]  Chief Complaint  Patient presents with  . Routine Post Op    (xray)surgery follow-up, new x-rays    DOS: 02/27/2020 Procedure: Right foot IPJ arthrodesis and excision of wound  65 y.o. male returns for post-op check.  Doing well he has no pain but some swelling  Review of Systems: Negative except as noted in the HPI. Denies N/V/F/Ch.   Objective:  There were no vitals filed for this visit. There is no height or weight on file to calculate BMI. Constitutional Well developed. Well nourished.  Vascular Foot warm and well perfused. Capillary refill normal to all digits.   Neurologic Normal speech. Oriented to person, place, and time. Epicritic sensation to light touch grossly reduced bilaterally.  Dermatologic  incision well-healed.  Moderate edema.  He does have an erythematous vesicular rash over the dorsal foot  Orthopedic: Tenderness to palpation noted about the surgical site.  Kirschner wires intact   Radiographs: Unchanged appearance of arthrodesis site since last visit, lucency still visible Assessment:   1. 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  S/p foot surgery right -Advised to quit smoking to allow this to continue to heal -XR: As above -WB Status: WBAT in regular supportive shoe gear when tolerated -He has developed a new vesicular rash on the dorsal foot since last visit.  I prescribed him Lotrisone treatment for this  -Nails were debrided in length and thickness without incident of all mycotic debris.  We will get him back on regular schedule for this for at risk diabetic foot care  Return in about 8 weeks (around 07/20/2020) for follow up from surgery, take new x-rays. o

## 2020-05-31 ENCOUNTER — Telehealth: Payer: Self-pay | Admitting: Endocrinology

## 2020-05-31 NOTE — Telephone Encounter (Signed)
Pt needs a refill on  Nicholas Whitehead, Nicholas Whitehead - [address] AT [facility]  ODEFSEY 200-25-25 MG TABS tablet

## 2020-06-02 ENCOUNTER — Other Ambulatory Visit: Payer: Self-pay | Admitting: Internal Medicine

## 2020-06-02 DIAGNOSIS — B20 Human immunodeficiency virus [HIV] disease: Secondary | ICD-10-CM

## 2020-06-02 MED ORDER — ODEFSEY 200-25-25 MG PO TABS: 1.0000 | ORAL_TABLET | Freq: Every day | ORAL | 1 refills | Status: DC

## 2020-06-02 NOTE — Telephone Encounter (Signed)
1.Medication Requested: ODEFSEY 200-25-25 MG TABS tablet  2. Pharmacy (Name, Street, [facility]): [facility] [place], [place] [facility] Phone:  [phone]  Fax:  [phone]     3. On Med List: Yes  4. Last Visit with PCP: 04/29/2020  5. Next visit date with PCP: 08/26/2020   Agent: Please be advised that RX refills may take up to 3 business days. We ask that you follow-up with your pharmacy.

## 2020-06-08 ENCOUNTER — Other Ambulatory Visit: Payer: Self-pay | Admitting: Internal Medicine

## 2020-06-08 DIAGNOSIS — F409 Phobic anxiety disorder, unspecified: Secondary | ICD-10-CM

## 2020-06-10 ENCOUNTER — Other Ambulatory Visit: Payer: Self-pay

## 2020-06-10 ENCOUNTER — Other Ambulatory Visit (INDEPENDENT_AMBULATORY_CARE_PROVIDER_SITE_OTHER): Payer: 59

## 2020-06-10 ENCOUNTER — Telehealth: Payer: Self-pay | Admitting: Pharmacist

## 2020-06-10 DIAGNOSIS — Z794 Long term (current) use of insulin: Secondary | ICD-10-CM

## 2020-06-10 DIAGNOSIS — E1165 Type 2 diabetes mellitus with hyperglycemia: Secondary | ICD-10-CM

## 2020-06-10 DIAGNOSIS — E1140 Type 2 diabetes mellitus with diabetic neuropathy, unspecified: Secondary | ICD-10-CM

## 2020-06-10 LAB — HEMOGLOBIN A1C: Hgb A1c MFr Bld: 6.7 % — ABNORMAL HIGH (ref 4.6–6.5)

## 2020-06-10 NOTE — Progress Notes (Signed)
Chronic Care Management Pharmacy Assistant   Name: Nicholas Whitehead  MRN: [id number] DOB: 1955/11/30  Reason for Encounter: Disease State-Diabetes  Recent office visits:  04/29/20 Nicholas Whitehead (PCP) - Annual Exam. Start Glucagon 1 mg.   Recent consult visits:  05/25/20 Nicholas Whitehead (Podiatry) - Neuropathic ulcer right foot. Start Clotrimazole-Betamethasone. F/u 2 mos.  Hospital visits:  None in previous 6 months  Medications: Outpatient Encounter Medications as of 06/10/2020  Medication Sig  . albuterol (PROVENTIL HFA;VENTOLIN HFA) 108 (90 Base) MCG/ACT inhaler Inhale 2 puffs into the lungs every 6 (six) hours as needed for wheezing or shortness of breath.  . Alcohol Swabs (B-D SINGLE USE SWABS REGULAR) PADS USE AS DIRECTED  . aspirin EC 81 MG tablet Take 81 mg by mouth every morning.  Marland Kitchen BEVESPI AEROSPHERE 9-4.8 MCG/ACT AERO INHALE 2 PUFFS INTO THE LUNGS TWO TIMES DAILY  . cetirizine (ZYRTEC) 10 MG tablet Take 10 mg by mouth daily.  . Cholecalciferol 2000 units TABS Take 1 tablet (2,000 Units total) by mouth daily.  . clotrimazole-betamethasone (LOTRISONE) cream Apply 1 application topically 2 (two) times daily.  . DULoxetine (CYMBALTA) 60 MG capsule Take 1 capsule (60 mg total) by mouth daily.  Marland Kitchen emtricitabine-rilpivir-tenofovir AF (ODEFSEY) 200-25-25 MG TABS tablet Take 1 tablet by mouth daily.  . Eszopiclone 3 MG TABS TAKE 1 TABLET BY MOUTH AT BEDTIME  . furosemide (LASIX) 20 MG tablet Take 1 tablet (20 mg total) by mouth 2 (two) times daily.  Marland Kitchen gabapentin (NEURONTIN) 300 MG capsule TAKE 1 CAPSULE BY MOUTH THREE TIMES DAILY  . Glucagon (GVOKE HYPOPEN 2-PACK) 1 MG/0.2ML SOAJ Inject 1 Act into the skin daily as needed.  Marland Kitchen glucose blood (ACCU-CHEK GUIDE) test strip TEST BLOOD SUGAR FOUR TIMES DAILY  . insulin glargine, 2 Unit Dial, (TOUJEO MAX SOLOSTAR) 300 UNIT/ML Solostar Pen ADMINISTER 86 UNITS UNDER THE SKIN DAILY  . insulin lispro (HUMALOG KWIKPEN) 100 UNIT/ML KwikPen Inject 25-30 Units  into the skin 3 (three) times daily.  . Insulin Pen Needle (DROPLET PEN NEEDLES) 31G X 8 MM MISC USE TO INJECT INSULIN  . Lancets MISC Use to test blood sugar tid. DX: E11.8  . linaclotide (LINZESS) 72 MCG capsule Take 1 capsule (72 mcg total) by mouth daily before breakfast.  . metFORMIN (GLUCOPHAGE-XR) 500 MG 24 hr tablet TAKE 2 TABLETS TWICE DAILY  . Nebivolol HCl 20 MG TABS TAKE 1 TABLET(20 MG) BY MOUTH DAILY  . olmesartan (BENICAR) 40 MG tablet TAKE 1 TABLET(40 MG) BY MOUTH DAILY  . OZEMPIC, 0.25 OR 0.5 MG/DOSE, 2 MG/1.5ML SOPN START WITH INJECTING 0.25 MG UNDER THE SKIN WEEKLY FOR 4 WEEKS, THEN INJECT 0.5 MG UNDER THE SKIN ONCE A WEEK  . pioglitazone (ACTOS) 30 MG tablet Take 1 tablet (30 mg total) by mouth daily.  . Pitavastatin Calcium (LIVALO) 1 MG TABS TAKE 1 TABLET(1 MG) BY MOUTH DAILY  . traZODone (DESYREL) 150 MG tablet TAKE 3 TABLETS(450 MG) BY MOUTH AT BEDTIME  . trazodone (DESYREL) 300 MG tablet TAKE 1 TABLET AT BEDTIME  . triamcinolone cream (KENALOG) 0.5 % APPLY 1 APPLICATION TOPICALLY THREE TIME A DAY   No facility-administered encounter medications on file as of 06/10/2020.   Recent Relevant Labs: Lab Results  Component Value Date/Time   HGBA1C 6.7 (H) 02/20/2020 07:55 AM   HGBA1C 6.4 12/17/2019 08:54 AM   MICROALBUR 1.4 04/29/2020 02:17 PM   MICROALBUR 1.0 02/19/2019 10:31 AM    Kidney Function Lab Results  Component Value Date/Time   CREATININE  1.01 02/20/2020 07:55 AM   CREATININE 1.03 01/12/2020 11:30 AM   CREATININE 1.14 12/17/2019 08:54 AM   CREATININE 0.99 03/18/2018 09:54 AM   GFR 79.82 02/20/2020 07:55 AM   GFRNONAA 77 01/12/2020 11:30 AM   GFRAA 90 01/12/2020 11:30 AM    . Current antihyperglycemic regimen:  ? Toujeo Max 86 units daily ? Novolog 25-30 units before meals ? Metformin ER 500 mg - 2 tab twice a day ? Pioglitazone 30 mg daily ? Ozempic 0.25 mg weekly ? Freestyle Libre 2   . What recent interventions/DTPs have been made to improve  glycemic control:  o None noted   . Have there been any recent hospitalizations or ED visits since last visit with CPP? No   . Patient reports hypoglycemic symptoms, including Vision changes   . Patient reports hyperglycemic symptoms, including headache   . How often are you checking your blood sugar? twice daily   . What are your blood sugars ranging?  o Patient states his monitor is not working correctly so he don't have any readings. Patient states his sugars does ranges in normal range most of the time.  . During the week, how often does your blood glucose drop below 70? It dropped Once recently   . Are you checking your feet daily/regularly?  o Patient states yes, he recently had an ulcer on his right foot.  Adherence Review: Is the patient currently on a STATIN medication? Yes Is the patient currently on ACE/ARB medication? Yes Does the patient have >5 day gap between last estimated fill dates? No ? Toujeo Max  ? Novolog -  ? Metformin ER - last fill 07/30/19 90D ? Pioglitazone - last fill 04/02/20 90D ? Ozempic - last fill 05/19/20 56D ? Freestyle Libre 2  Star Rating Drugs: Olmesartan - last fill 03/30/20 90D Pitavastatin Calcium - last fill 04/23/20 90D Metformin ER - last fill 07/30/19 90D (Patient states he used mail order in the past and had extras on hand but now needs a refill)   Orinda Kenner, [facility] Assistant [phone]  Time Spent: [phone]

## 2020-06-11 LAB — LIPID PANEL
Cholesterol: 109 mg/dL (ref 0–200)
HDL: 39.7 mg/dL (ref >39.00)
LDL Cholesterol: 38 mg/dL (ref 0–99)
NonHDL: 68.95
Total CHOL/HDL Ratio: 3
Triglycerides: 154 mg/dL — ABNORMAL HIGH (ref 0.0–149.0)
VLDL: 30.8 mg/dL (ref 0.0–40.0)

## 2020-06-11 LAB — COMPREHENSIVE METABOLIC PANEL
ALT: 19 U/L (ref 0–53)
AST: 23 U/L (ref 0–37)
Albumin: 4.5 g/dL (ref 3.5–5.2)
Alkaline Phosphatase: 41 U/L (ref 39–117)
BUN: 18 mg/dL (ref 6–23)
CO2: 29 mEq/L (ref 19–32)
Calcium: 9.7 mg/dL (ref 8.4–10.5)
Chloride: 100 mEq/L (ref 96–112)
Creatinine, Ser: 1.32 mg/dL (ref 0.40–1.50)
GFR: 57.77 mL/min — ABNORMAL LOW (ref >60.00)
Glucose, Bld: 142 mg/dL — ABNORMAL HIGH (ref 70–99)
Potassium: 4.2 mEq/L (ref 3.5–5.1)
Sodium: 141 mEq/L (ref 135–145)
Total Bilirubin: 0.3 mg/dL (ref 0.2–1.2)
Total Protein: 7.9 g/dL (ref 6.0–8.3)

## 2020-06-11 MED ORDER — METFORMIN HCL ER 500 MG PO TB24: ORAL_TABLET | ORAL | 1 refills | Status: DC

## 2020-06-11 NOTE — Addendum Note (Signed)
Addended by: Charlton Haws on: 06/11/2020 04:51 PM   Modules accepted: Orders

## 2020-06-15 ENCOUNTER — Other Ambulatory Visit: Payer: Self-pay

## 2020-06-15 ENCOUNTER — Encounter: Payer: Self-pay | Admitting: Endocrinology

## 2020-06-15 ENCOUNTER — Ambulatory Visit (INDEPENDENT_AMBULATORY_CARE_PROVIDER_SITE_OTHER): Payer: 59 | Admitting: Endocrinology

## 2020-06-15 VITALS — BP 142/82 | HR 60 | Ht 75.0 in | Wt 202.0 lb

## 2020-06-15 DIAGNOSIS — E1165 Type 2 diabetes mellitus with hyperglycemia: Secondary | ICD-10-CM | POA: Diagnosis not present

## 2020-06-15 DIAGNOSIS — E785 Hyperlipidemia, unspecified: Secondary | ICD-10-CM | POA: Diagnosis not present

## 2020-06-15 DIAGNOSIS — Z794 Long term (current) use of insulin: Secondary | ICD-10-CM | POA: Diagnosis not present

## 2020-06-15 NOTE — Patient Instructions (Signed)
Check blood sugars on waking up 7 days a week  Also check blood sugars about 2 hours after meals and do this after different meals by rotation  Recommended blood sugar levels on waking up are 90-130 and about 2 hours after meal is 130-160  Please bring your blood sugar monitor to each visit, thank you

## 2020-06-15 NOTE — Progress Notes (Signed)
Patient ID: Nicholas Whitehead, male   DOB: January 27, 1955, 65 y.o.   MRN: [id number]           Patient ID: Nicholas Whitehead, male   DOB: 03/13/1955, 65 y.o.   MRN: [id number]   Reason for Appointment: Type II Diabetes follow-up   History of Present Illness   Diagnosis date: 1998    Previous history: He was started on oral agents at diagnosis and around 2006 was put on insulin His blood sugars are usually well controlled when he takes Actos along with his insulin. He had been on Invokana since 04/2013  He requires large doses of insulin especially basal insulin   Recent history:   Insulin regimen: Toujeo 86 units p.m.;  Novolog 30 units before meals         Oral hypoglycemic drugs: Metformin ER 1000 mg twice a day, Actos 30 mg, Ozempic 0.5 weekly  A1c is  6.7 compared to 6.7   Previous fructosamine 298  Current management, blood sugar patterns and problems:  He is able to tolerate 0.5 mg Ozempic and is not having any nausea and weight loss at this time with this  In fact has gained 6 pounds, likely to be from less stress and depression  He is still not very active because of issues with foot ulcer but is starting to walk a little now  He was told to take his insulin consistently regardless of the [place] blood sugar and he is doing this  However he does not understand how to adjust his mealtime dose based on portions or carbohydrate intake  His blood sugars are somewhat variable still in the morning but did not have any consistent times when he is hyperglycemic  Also checking frequently after dinner at night  RECENT blood sugars monitoring is inadequate but most of his blood sugars have been below 150-160 except 224 last night  No hypoglycemia with lowest blood sugar 84  He says he is having difficulty with his freestyle libre reader and is trying to get a replacement  He has been trying to do some walking but not a lot and recently having foot ulcer     Side effects from  medications:  urgency, frequent urination from [place] blood glucose:  2-3 + times a day  Glucometer:   Accu-Chek  Blood sugar readings for 2 weeks and averages for 30 days   PRE-MEAL Fasting Lunch Dinner Bedtime Overall  Glucose range:     84-265  Mean/median:  150  151  137  156 152   POST-MEAL PC Breakfast PC Lunch PC Dinner  Glucose range:     Mean/median:   152    Previously:  PRE-MEAL Fasting/ morning Lunch Dinner Bedtime Overall  Glucose range:  111-177  107-198 91-173  114-224   Mean/median:  128  158  147  157 151           Meals: Breakfast variable, 9-10 AM Lunch 2-3pm  usually avoiding high-fat meals, dinner at about 6 pm          Dietician visit: Most recent: 2013    Weight control:  Wt Readings from Last 3 Encounters:  06/15/20 202 lb (91.6 kg)  04/29/20 196 lb (88.9 kg)  03/09/20 198 lb (89.8 kg)          Diabetes labs:  Lab Results  Component Value Date   HGBA1C 6.7 (H) 06/10/2020   HGBA1C 6.7 (H) 02/20/2020  HGBA1C 6.4 12/17/2019   Lab Results  Component Value Date   MICROALBUR 1.4 04/29/2020   LDLCALC 38 06/10/2020   CREATININE 1.32 06/10/2020   Lab Results  Component Value Date   FRUCTOSAMINE 280 09/11/2019   FRUCTOSAMINE 298 (H) 04/28/2019   FRUCTOSAMINE 276 09/13/2018   FRUCTOSAMINE 295 (H) 07/09/2018   FRUCTOSAMINE 309 (H) 02/13/2018    Other active problems discussed today: See review of systems   Lab on 06/10/2020  Component Date Value Ref Range Status  . Cholesterol 06/10/2020 109  0 - 200 mg/dL Final   ATP III Classification       Desirable:  < 200 mg/dL               Borderline High:  200 - 239 mg/dL          High:  > = 240 mg/dL  . Triglycerides 06/10/2020 154.0* 0.0 - 149.0 mg/dL Final   Normal:  <150 mg/dLBorderline High:  150 - 199 mg/dL  . HDL 06/10/2020 39.70  >39.00 mg/dL Final  . VLDL 06/10/2020 30.8  0.0 - 40.0 mg/dL Final  . LDL Cholesterol 06/10/2020 38  0 - 99 mg/dL Final  . Total CHOL/HDL  Ratio 06/10/2020 3   Final                  Men          Women1/2 Average Risk     3.4          3.3Average Risk          5.0          4.42X Average Risk          9.6          7.13X Average Risk          15.0          11.0                      . NonHDL 06/10/2020 68.95   Final   NOTE:  Non-HDL goal should be 30 mg/dL higher than patient's LDL goal (i.e. LDL goal of < 70 mg/dL, would have non-HDL goal of < 100 mg/dL)  . Sodium 06/10/2020 141  135 - 145 mEq/L Final  . Potassium 06/10/2020 4.2  3.5 - 5.1 mEq/L Final  . Chloride 06/10/2020 100  96 - 112 mEq/L Final  . CO2 06/10/2020 29  19 - 32 mEq/L Final  . Glucose, Bld 06/10/2020 142* 70 - 99 mg/dL Final  . BUN 06/10/2020 18  6 - 23 mg/dL Final  . Creatinine, Ser 06/10/2020 1.32  0.40 - 1.50 mg/dL Final  . Total Bilirubin 06/10/2020 0.3  0.2 - 1.2 mg/dL Final  . Alkaline Phosphatase 06/10/2020 41  39 - 117 U/L Final  . AST 06/10/2020 23  0 - 37 U/L Final  . ALT 06/10/2020 19  0 - 53 U/L Final  . Total Protein 06/10/2020 7.9  6.0 - 8.3 g/dL Final  . Albumin 06/10/2020 4.5  3.5 - 5.2 g/dL Final  . GFR 06/10/2020 57.77* >60.00 mL/min Final   Calculated using the CKD-EPI Creatinine Equation (2021)  . Calcium 06/10/2020 9.7  8.4 - 10.5 mg/dL Final  . Hgb A1c MFr Bld 06/10/2020 6.7* 4.6 - 6.5 % Final   Glycemic Control Guidelines for People with Diabetes:Non Diabetic:  <6%Goal of Therapy: <7%Additional Action Suggested:  >8%      Allergies as of 06/15/2020  Reactions   Benazepril Cough   Molds & Smuts    Hard to breathe   Pravastatin Other (See Comments)   myositis   Glipizide Nausea Only      Medication List       Accurate as of June 15, 2020 10:17 AM. If you have any questions, ask your nurse or doctor.        Accu-Chek Guide test strip Generic drug: glucose blood TEST BLOOD SUGAR FOUR TIMES DAILY   albuterol 108 (90 Base) MCG/ACT inhaler Commonly known as: VENTOLIN HFA Inhale 2 puffs into the lungs every 6 (six) hours as  needed for wheezing or shortness of breath.   aspirin EC 81 MG tablet Take 81 mg by mouth every morning.   B-D SINGLE USE SWABS REGULAR Pads USE AS DIRECTED   Bevespi Aerosphere 9-4.8 MCG/ACT Aero Generic drug: Glycopyrrolate-Formoterol INHALE 2 PUFFS INTO THE LUNGS TWO TIMES DAILY   cetirizine 10 MG tablet Commonly known as: ZYRTEC Take 10 mg by mouth daily.   Cholecalciferol 50 MCG (2000 UT) Tabs Take 1 tablet (2,000 Units total) by mouth daily.   clotrimazole-betamethasone cream Commonly known as: Lotrisone Apply 1 application topically 2 (two) times daily.   Droplet Pen Needles 31G X 8 MM Misc Generic drug: Insulin Pen Needle USE TO INJECT INSULIN   DULoxetine 60 MG capsule Commonly known as: CYMBALTA Take 1 capsule (60 mg total) by mouth daily.   Eszopiclone 3 MG Tabs TAKE 1 TABLET BY MOUTH AT BEDTIME   furosemide 20 MG tablet Commonly known as: LASIX Take 1 tablet (20 mg total) by mouth 2 (two) times daily.   gabapentin 300 MG capsule Commonly known as: NEURONTIN TAKE 1 CAPSULE BY MOUTH THREE TIMES DAILY   Gvoke HypoPen 2-Pack 1 MG/0.2ML Soaj Generic drug: Glucagon Inject 1 Act into the skin daily as needed.   insulin lispro 100 UNIT/ML KwikPen Commonly known as: HumaLOG KwikPen Inject 25-30 Units into the skin 3 (three) times daily.   Lancets Misc Use to test blood sugar tid. DX: E11.8   linaclotide 72 MCG capsule Commonly known as: Linzess Take 1 capsule (72 mcg total) by mouth daily before breakfast.   Livalo 1 MG Tabs Generic drug: Pitavastatin Calcium TAKE 1 TABLET(1 MG) BY MOUTH DAILY   metFORMIN 500 MG 24 hr tablet Commonly known as: GLUCOPHAGE-XR TAKE 2 TABLETS TWICE DAILY   Nebivolol HCl 20 MG Tabs TAKE 1 TABLET(20 MG) BY MOUTH DAILY   Odefsey 200-25-25 MG Tabs tablet Generic drug: emtricitabine-rilpivir-tenofovir AF Take 1 tablet by mouth daily.   olmesartan 40 MG tablet Commonly known as: BENICAR TAKE 1 TABLET(40 MG) BY MOUTH  DAILY   Ozempic (0.25 or 0.5 MG/DOSE) 2 MG/1.5ML Sopn Generic drug: Semaglutide(0.25 or 0.5MG /DOS) START WITH INJECTING 0.25 MG UNDER THE SKIN WEEKLY FOR 4 WEEKS, THEN INJECT 0.5 MG UNDER THE SKIN ONCE A WEEK   pioglitazone 30 MG tablet Commonly known as: ACTOS Take 1 tablet (30 mg total) by mouth daily.   Toujeo Max SoloStar 300 UNIT/ML Solostar Pen Generic drug: insulin glargine (2 Unit Dial) ADMINISTER 86 UNITS UNDER THE SKIN DAILY   trazodone 300 MG tablet Commonly known as: DESYREL TAKE 1 TABLET AT BEDTIME   traZODone 150 MG tablet Commonly known as: DESYREL TAKE 3 TABLETS(450 MG) BY MOUTH AT BEDTIME   triamcinolone cream 0.5 % Commonly known as: KENALOG APPLY 1 APPLICATION TOPICALLY THREE TIME A DAY       Allergies:  Allergies  Allergen Reactions  .  Benazepril Cough  . Molds & Smuts     Hard to breathe  . Pravastatin Other (See Comments)    myositis  . Glipizide Nausea Only    Past Medical History:  Diagnosis Date  . Anxiety   . Cataract    right   . Depression   . Diabetes mellitus without complication ([place])   . DM (diabetes mellitus screen)   . HIV disease ([place]) 09/12/1995  . HIV infection ([place])   . Hypercholesterolemia   . Hyperlipidemia   . Hypertension     Past Surgical History:  Procedure Laterality Date  . COLONOSCOPY WITH PROPOFOL  01/16/2012   Procedure: COLONOSCOPY WITH PROPOFOL;  Surgeon: Garlan Fair, MD;  Location: [facility];  Service: Endoscopy;  Laterality: N/A;  . EYE SURGERY  2005   catarct surgery  . optic lens surgery  2005    Family History  Problem Relation Age of Onset  . Arthritis Mother   . Diabetes Mother   . Heart disease Mother   . Stroke Mother   . Arthritis Father   . Diabetes Father   . Heart disease Father     Social History:  reports that he has been smoking cigarettes. He has been smoking about 0.25 packs per day. He has never used smokeless tobacco. He reports that he does not drink alcohol and does  not use drugs.  Review of Systems:  Hypertension:  on Bystolic  and on Benicar HCT 1/2, previously on losartan, followed by PCP Also on Lasix as needed   BP Readings from Last 3 Encounters:  06/15/20 (!) 142/82  04/29/20 138/78  03/09/20 (!) 156/92   RENAL function: consistently normal although slightly higher recently  Lab Results  Component Value Date   CREATININE 1.32 06/10/2020   CREATININE 1.01 02/20/2020   CREATININE 1.03 01/12/2020    Lipids: LDL is normal as follows Treatment prescribed by PCP, currently on Livalo 1 mg   Lab Results  Component Value Date   CHOL 109 06/10/2020   HDL 39.70 06/10/2020   LDLCALC 38 06/10/2020   TRIG 154.0 (H) 06/10/2020   CHOLHDL 3 06/10/2020    Neuropathy: Has had paresthesiae in feet and legs, these are treated with gabapentin 300 mg 3 times a day   Forms for the diabetic shoes were sent in July 21 to the podiatrist. Has sensory loss on exam He had surgery for foot ulcer on the right side    LABS:  Lab on 06/10/2020  Component Date Value Ref Range Status  . Cholesterol 06/10/2020 109  0 - 200 mg/dL Final   ATP III Classification       Desirable:  < 200 mg/dL               Borderline High:  200 - 239 mg/dL          High:  > = 240 mg/dL  . Triglycerides 06/10/2020 154.0* 0.0 - 149.0 mg/dL Final   Normal:  <150 mg/dLBorderline High:  150 - 199 mg/dL  . HDL 06/10/2020 39.70  >39.00 mg/dL Final  . VLDL 06/10/2020 30.8  0.0 - 40.0 mg/dL Final  . LDL Cholesterol 06/10/2020 38  0 - 99 mg/dL Final  . Total CHOL/HDL Ratio 06/10/2020 3   Final                  Men          Women1/2 Average Risk     3.4  3.3Average Risk          5.0          4.42X Average Risk          9.6          7.13X Average Risk          15.0          11.0                      . NonHDL 06/10/2020 68.95   Final   NOTE:  Non-HDL goal should be 30 mg/dL higher than patient's LDL goal (i.e. LDL goal of < 70 mg/dL, would have non-HDL goal of < 100 mg/dL)  .  Sodium 06/10/2020 141  135 - 145 mEq/L Final  . Potassium 06/10/2020 4.2  3.5 - 5.1 mEq/L Final  . Chloride 06/10/2020 100  96 - 112 mEq/L Final  . CO2 06/10/2020 29  19 - 32 mEq/L Final  . Glucose, Bld 06/10/2020 142* 70 - 99 mg/dL Final  . BUN 06/10/2020 18  6 - 23 mg/dL Final  . Creatinine, Ser 06/10/2020 1.32  0.40 - 1.50 mg/dL Final  . Total Bilirubin 06/10/2020 0.3  0.2 - 1.2 mg/dL Final  . Alkaline Phosphatase 06/10/2020 41  39 - 117 U/L Final  . AST 06/10/2020 23  0 - 37 U/L Final  . ALT 06/10/2020 19  0 - 53 U/L Final  . Total Protein 06/10/2020 7.9  6.0 - 8.3 g/dL Final  . Albumin 06/10/2020 4.5  3.5 - 5.2 g/dL Final  . GFR 06/10/2020 57.77* >60.00 mL/min Final   Calculated using the CKD-EPI Creatinine Equation (2021)  . Calcium 06/10/2020 9.7  8.4 - 10.5 mg/dL Final  . Hgb A1c MFr Bld 06/10/2020 6.7* 4.6 - 6.5 % Final   Glycemic Control Guidelines for People with Diabetes:Non Diabetic:  <6%Goal of Therapy: <7%Additional Action Suggested:  >8%      Examination:   BP (!) 142/82   Pulse 60   Ht 6\' 3"  (1.905 m)   Wt 202 lb (91.6 kg)   SpO2 95%   BMI 25.25 kg/m   Body mass index is 25.25 kg/m.     ASSESSMENT/ PLAN:    Diabetes type 2 on insulin:   See history of present illness for detailed discussion of his current management, blood sugar patterns and problems identified  His A1c is 6.7   He is on basal bolus insulin, Metformin, Ozempic 0.5 mg weekly and Actos 30 mg  Again his blood sugars are somewhat variable but averaging about 150 recently including readings after dinner Blood sugars in the mornings may be still relatively high although as low as 84 early morning Will be able to assess his blood sugar patterns better if he goes back on his freestyle libre which he is unable to use currently because of faulty reader As discussed above he is taking fixed doses of mealtime insulins as he is not able to adjust on his own  No hypoglycemia even with large doses of  insulin   Recommendations:  Continue 0.5 mg Ozempic May consider increasing the dose if he has further weight gain He will let us know if he has any difficulty getting his freestyle libre set up  No change in Toujeo A1c on the next visit  LIPIDS: Well-controlled   There are no Patient Instructions on file for this visit.       Elayne Snare 06/15/2020, 10:17 AM           ..

## 2020-06-17 ENCOUNTER — Other Ambulatory Visit (HOSPITAL_COMMUNITY)
Admission: RE | Admit: 2020-06-17 | Discharge: 2020-06-17 | Disposition: A | Discharge status: Home / Self Care | Payer: 59 | Source: Ambulatory Visit | Attending: Internal Medicine | Admitting: Internal Medicine

## 2020-06-17 ENCOUNTER — Other Ambulatory Visit: Payer: 59

## 2020-06-17 ENCOUNTER — Other Ambulatory Visit: Payer: Self-pay

## 2020-06-17 DIAGNOSIS — Z113 Encounter for screening for infections with a predominantly sexual mode of transmission: Secondary | ICD-10-CM | POA: Insufficient documentation

## 2020-06-17 DIAGNOSIS — B20 Human immunodeficiency virus [HIV] disease: Secondary | ICD-10-CM | POA: Diagnosis present

## 2020-06-18 LAB — URINE CYTOLOGY ANCILLARY ONLY
Chlamydia: NEGATIVE
Comment: NEGATIVE
Comment: NORMAL
Neisseria Gonorrhea: NEGATIVE

## 2020-06-18 LAB — T-HELPER CELL (CD4) - (RCID CLINIC ONLY)
CD4 % Helper T Cell: 34 % (ref 33–65)
CD4 T Cell Abs: 638 /uL (ref 400–1790)

## 2020-06-21 LAB — HIV-1 RNA QUANT-NO REFLEX-BLD
HIV 1 RNA Quant: NOT DETECTED Copies/mL
HIV-1 RNA Quant, Log: NOT DETECTED Log cps/mL

## 2020-06-22 ENCOUNTER — Other Ambulatory Visit: Payer: Self-pay | Admitting: Internal Medicine

## 2020-06-22 DIAGNOSIS — I10 Essential (primary) hypertension: Secondary | ICD-10-CM

## 2020-07-02 ENCOUNTER — Encounter: Payer: Self-pay | Admitting: Internal Medicine

## 2020-07-02 ENCOUNTER — Ambulatory Visit (INDEPENDENT_AMBULATORY_CARE_PROVIDER_SITE_OTHER): Payer: 59 | Admitting: Internal Medicine

## 2020-07-02 ENCOUNTER — Other Ambulatory Visit: Payer: Self-pay

## 2020-07-02 VITALS — BP 153/79 | HR 65 | Temp 98.3°F | Wt 196.6 lb

## 2020-07-02 DIAGNOSIS — B20 Human immunodeficiency virus [HIV] disease: Secondary | ICD-10-CM

## 2020-07-02 DIAGNOSIS — Z720 Tobacco use: Secondary | ICD-10-CM

## 2020-07-02 DIAGNOSIS — Z113 Encounter for screening for infections with a predominantly sexual mode of transmission: Secondary | ICD-10-CM

## 2020-07-02 MED ORDER — ODEFSEY 200-25-25 MG PO TABS: 1.0000 | ORAL_TABLET | Freq: Every day | ORAL | 3 refills | Status: DC

## 2020-07-02 NOTE — Assessment & Plan Note (Signed)
He continues to do well on his regimen and no changes indicated.   rtc in 6 months.

## 2020-07-02 NOTE — Addendum Note (Signed)
Addended by: Thayer Headings on: 07/02/2020 03:00 PM   Modules accepted: Orders

## 2020-07-02 NOTE — Assessment & Plan Note (Signed)
He continues to be precontemplative.

## 2020-07-02 NOTE — Progress Notes (Signed)
   Subjective:    Patient ID: Nicholas Whitehead, male    DOB: 21-Mar-1955, 65 y.o.   MRN: [id number]  HPI Here for follow up of HIV He continues on [facility] and denies any missed doses.  CD4 of 638 and viral load not detected.  He has no complaints today.  He continues to have depression but feels he is in a much better place and no new concerns from this.  No SI voiced.  He is applying for new housing.     Review of Systems  Constitutional:  Negative for fatigue and unexpected weight change.  Gastrointestinal:  Negative for diarrhea and nausea.      Objective:   Physical Exam Eyes:     General: No scleral icterus. Pulmonary:     Effort: Pulmonary effort is normal.  Skin:    Findings: No rash.  Neurological:     Mental Status: He is alert.          Assessment & Plan:

## 2020-07-19 ENCOUNTER — Other Ambulatory Visit: Payer: Self-pay | Admitting: Endocrinology

## 2020-07-19 ENCOUNTER — Other Ambulatory Visit: Payer: Self-pay | Admitting: Internal Medicine

## 2020-07-19 DIAGNOSIS — Z794 Long term (current) use of insulin: Secondary | ICD-10-CM

## 2020-07-19 DIAGNOSIS — E1140 Type 2 diabetes mellitus with diabetic neuropathy, unspecified: Secondary | ICD-10-CM

## 2020-07-19 DIAGNOSIS — F419 Anxiety disorder, unspecified: Secondary | ICD-10-CM

## 2020-07-19 DIAGNOSIS — F32A Depression, unspecified: Secondary | ICD-10-CM

## 2020-07-19 NOTE — Telephone Encounter (Signed)
Patient called and is requesting a refill of his Free Style Libre sensor, transmitter, and receiver be sent to the [facility] on [address]

## 2020-07-20 ENCOUNTER — Ambulatory Visit: Payer: Medicare Other | Admitting: Podiatry

## 2020-07-20 LAB — HM DIABETES EYE EXAM

## 2020-07-22 ENCOUNTER — Ambulatory Visit (INDEPENDENT_AMBULATORY_CARE_PROVIDER_SITE_OTHER): Payer: 59 | Admitting: Podiatry

## 2020-07-22 ENCOUNTER — Ambulatory Visit (INDEPENDENT_AMBULATORY_CARE_PROVIDER_SITE_OTHER): Payer: 59

## 2020-07-22 ENCOUNTER — Other Ambulatory Visit: Payer: Self-pay

## 2020-07-22 DIAGNOSIS — E1140 Type 2 diabetes mellitus with diabetic neuropathy, unspecified: Secondary | ICD-10-CM | POA: Diagnosis not present

## 2020-07-22 DIAGNOSIS — F17200 Nicotine dependence, unspecified, uncomplicated: Secondary | ICD-10-CM | POA: Diagnosis not present

## 2020-07-22 DIAGNOSIS — Z9889 Other specified postprocedural states: Secondary | ICD-10-CM | POA: Diagnosis not present

## 2020-07-22 DIAGNOSIS — T84498A Other mechanical complication of other internal orthopedic devices, implants and grafts, initial encounter: Secondary | ICD-10-CM

## 2020-07-22 DIAGNOSIS — M2031 Hallux varus (acquired), right foot: Secondary | ICD-10-CM | POA: Diagnosis not present

## 2020-07-22 DIAGNOSIS — Z794 Long term (current) use of insulin: Secondary | ICD-10-CM

## 2020-07-22 NOTE — Progress Notes (Signed)
  Subjective:  Patient ID: Nicholas Whitehead, male    DOB: 1955-06-28,  MRN: [id number]  Chief Complaint  Patient presents with   Follow-up    Post op surgery follow up pt states that he is doing well.     DOS: 02/27/2020 Procedure: Right foot IPJ arthrodesis and excision of wound  65 y.o. male returns for post-op visit he has minimal pain no recurrence of ulceration  Review of Systems: Negative except as noted in the HPI. Denies N/V/F/Ch.   Objective:  There were no vitals filed for this visit. There is no height or weight on file to calculate BMI. Constitutional Well developed. Well nourished.  Vascular Foot warm and well perfused. Capillary refill normal to all digits.   Neurologic Normal speech. Oriented to person, place, and time. Epicritic sensation to light touch grossly reduced bilaterally.  Dermatologic  incision well-healed.  No recurrence of ulceration  Orthopedic: No pain on palpation or range of motion of the arthrodesis site   Radiographs: Lucency still visible he has increased plantarflexion and abduction of the arthrodesis site Assessment:   1. Status post surgery   2. Hallux malleus of right foot   3. Type 2 diabetes mellitus with diabetic neuropathy, with long-term current use of insulin ([place])   4. Current every day smoker   5. Arthrodesis malunion, initial encounter [facility])    Plan:  Patient was evaluated and treated and all questions answered.  S/p foot surgery right -Overall doing well.  I do think he has a malunion of the arthrodesis site that is asymptomatic.  Hopefully this does not lead to recurrence of ulceration.  Return to see me as needed at this point.  Return if symptoms worsen or fail to improve. o

## 2020-08-03 ENCOUNTER — Telehealth: Payer: Self-pay | Admitting: Pharmacist

## 2020-08-03 NOTE — Progress Notes (Cosign Needed)
Chronic Care Management Pharmacy Assistant   Name: Nicholas Whitehead  MRN: [id number] DOB: [birth year]   Reason for Encounter: Disease State   Conditions to be addressed/monitored: DMII   Recent office visits:  None ID  Recent consult visits:  06/15/20 Dr. Elayne Snare, EndocrinologyUncontrolled type 2 diabetes mellitus with hyperglycemia, with long-term current use of insulin ([place])  07/02/20 Dr. Scharlene Gloss Infectious Disease -HIV disease 07/22/20 Lanae Crumbly DPM, Podiatry Orders placed: [facility] visits:  None in previous 6 months  Medications: Outpatient Encounter Medications as of 08/03/2020  Medication Sig   albuterol (PROVENTIL HFA;VENTOLIN HFA) 108 (90 Base) MCG/ACT inhaler Inhale 2 puffs into the lungs every 6 (six) hours as needed for wheezing or shortness of breath.   Alcohol Swabs (B-D SINGLE USE SWABS REGULAR) PADS USE AS DIRECTED   aspirin EC 81 MG tablet Take 81 mg by mouth every morning.   BEVESPI AEROSPHERE 9-4.8 MCG/ACT AERO INHALE 2 PUFFS INTO THE LUNGS TWO TIMES DAILY   cetirizine (ZYRTEC) 10 MG tablet Take 10 mg by mouth daily.   Cholecalciferol 2000 units TABS Take 1 tablet (2,000 Units total) by mouth daily.   clotrimazole-betamethasone (LOTRISONE) cream Apply 1 application topically 2 (two) times daily.   DULoxetine (CYMBALTA) 60 MG capsule TAKE 1 CAPSULE(60 MG) BY MOUTH DAILY   emtricitabine-rilpivir-tenofovir AF (ODEFSEY) 200-25-25 MG TABS tablet Take 1 tablet by mouth daily.   Eszopiclone 3 MG TABS TAKE 1 TABLET BY MOUTH AT BEDTIME   furosemide (LASIX) 20 MG tablet Take 1 tablet (20 mg total) by mouth 2 (two) times daily.   gabapentin (NEURONTIN) 300 MG capsule TAKE 1 CAPSULE BY MOUTH THREE TIMES DAILY   Glucagon (GVOKE HYPOPEN 2-PACK) 1 MG/0.2ML SOAJ Inject 1 Act into the skin daily as needed.   glucose blood (ACCU-CHEK GUIDE) test strip TEST BLOOD SUGAR FOUR TIMES DAILY   insulin glargine, 2 Unit Dial, (TOUJEO MAX SOLOSTAR) 300  UNIT/ML Solostar Pen ADMINISTER 86 UNITS UNDER THE SKIN DAILY   insulin lispro (HUMALOG KWIKPEN) 100 UNIT/ML KwikPen Inject 25-30 Units into the skin 3 (three) times daily.   Insulin Pen Needle (DROPLET PEN NEEDLES) 31G X 8 MM MISC USE TO INJECT INSULIN   Lancets MISC Use to test blood sugar tid. DX: E11.8   linaclotide (LINZESS) 72 MCG capsule Take 1 capsule (72 mcg total) by mouth daily before breakfast.   metFORMIN (GLUCOPHAGE-XR) 500 MG 24 hr tablet TAKE 2 TABLETS TWICE DAILY   Nebivolol HCl 20 MG TABS TAKE 1 TABLET(20 MG) BY MOUTH DAILY   olmesartan (BENICAR) 40 MG tablet TAKE 1 TABLET(40 MG) BY MOUTH DAILY   OZEMPIC, 0.25 OR 0.5 MG/DOSE, 2 MG/1.5ML SOPN START WITH INJECTING 0.25 MG UNDER THE SKIN WEEKLY FOR 4 WEEKS, THEN INJECT 0.5 MG UNDER THE SKIN ONCE A WEEK   pioglitazone (ACTOS) 30 MG tablet Take 1 tablet (30 mg total) by mouth daily.   Pitavastatin Calcium (LIVALO) 1 MG TABS TAKE 1 TABLET(1 MG) BY MOUTH DAILY   traZODone (DESYREL) 150 MG tablet TAKE 3 TABLETS(450 MG) BY MOUTH AT BEDTIME   trazodone (DESYREL) 300 MG tablet TAKE 1 TABLET AT BEDTIME   triamcinolone cream (KENALOG) 0.5 % APPLY 1 APPLICATION TOPICALLY THREE TIME A DAY   No facility-administered encounter medications on file as of 08/03/2020.    Recent Relevant Labs: Lab Results  Component Value Date/Time   HGBA1C 6.7 (H) 06/10/2020 09:24 AM   HGBA1C 6.7 (H) 02/20/2020 07:55 AM   MICROALBUR  1.4 04/29/2020 02:17 PM   MICROALBUR 1.0 02/19/2019 10:31 AM    Kidney Function Lab Results  Component Value Date/Time   CREATININE 1.32 06/10/2020 09:24 AM   CREATININE 1.01 02/20/2020 07:55 AM   CREATININE 1.03 01/12/2020 11:30 AM   CREATININE 0.99 03/18/2018 09:54 AM   GFR 57.77 (L) 06/10/2020 09:24 AM   GFRNONAA 77 01/12/2020 11:30 AM   GFRAA 90 01/12/2020 11:30 AM    Current antihyperglycemic regimen:  Toujeo Max 86 units daily(Patient states that Dr. Dwyane Dee D/C) Novolog 25-30 units before meals Metformin ER 500  mg - 2 tab twice a day Pioglitazone 30 mg daily Ozempic 0.25 mg weekly Freestyle Libre 2  What recent interventions/DTPs have been made to improve glycemic control:  Continue 0.5 mg Ozempic May consider increasing the dose if he has further weight gain  Have there been any recent hospitalizations or ED visits since last visit with CPP? No  Patient reports hypoglycemic symptoms, including None  Patient denies hyperglycemic symptoms, including none  How often are you checking your blood sugar? 3-4 times daily  What are your blood sugars ranging? 59-150 Fasting: 78 this morning Before meals: 109 after taking meds and eating After meals: 151 this afternoon Bedtime: 59 last night before he went to be. He ate some ice cream and got up to 98  During the week, how often does your blood glucose drop below 70?  Patient states that his blood sugar levels have been low all week, around 60  Are you checking your feet daily/regularly? Patient states that he is not having any problems with his feet right now, getting better  Adherence Review: Is the patient currently on a STATIN medication? Yes Is the patient currently on ACE/ARB medication? Yes Does the patient have >5 day gap between last estimated fill dates? No   Star Rating Drugs: Olmesartan - last fill 06/29/20 90D Pitavastatin Calcium - last fill 07/19/20 90D Metformin ER - last fill 06/11/20 [place] Clinical Pharmacist Assistant [phone]   Time spent:40

## 2020-08-20 ENCOUNTER — Encounter: Payer: Self-pay | Admitting: Endocrinology

## 2020-08-25 ENCOUNTER — Ambulatory Visit (INDEPENDENT_AMBULATORY_CARE_PROVIDER_SITE_OTHER): Payer: 59 | Admitting: Podiatry

## 2020-08-25 ENCOUNTER — Other Ambulatory Visit: Payer: Self-pay

## 2020-08-25 DIAGNOSIS — M79675 Pain in left toe(s): Secondary | ICD-10-CM | POA: Diagnosis not present

## 2020-08-25 DIAGNOSIS — E1140 Type 2 diabetes mellitus with diabetic neuropathy, unspecified: Secondary | ICD-10-CM | POA: Diagnosis not present

## 2020-08-25 DIAGNOSIS — L84 Corns and callosities: Secondary | ICD-10-CM | POA: Diagnosis not present

## 2020-08-25 DIAGNOSIS — M79674 Pain in right toe(s): Secondary | ICD-10-CM | POA: Diagnosis not present

## 2020-08-25 DIAGNOSIS — B351 Tinea unguium: Secondary | ICD-10-CM | POA: Diagnosis not present

## 2020-08-25 DIAGNOSIS — Z794 Long term (current) use of insulin: Secondary | ICD-10-CM

## 2020-08-26 ENCOUNTER — Ambulatory Visit (INDEPENDENT_AMBULATORY_CARE_PROVIDER_SITE_OTHER): Payer: 59 | Admitting: Pharmacist

## 2020-08-26 ENCOUNTER — Other Ambulatory Visit: Payer: Self-pay | Admitting: Endocrinology

## 2020-08-26 DIAGNOSIS — E1140 Type 2 diabetes mellitus with diabetic neuropathy, unspecified: Secondary | ICD-10-CM

## 2020-08-26 DIAGNOSIS — I10 Essential (primary) hypertension: Secondary | ICD-10-CM | POA: Diagnosis not present

## 2020-08-26 DIAGNOSIS — F419 Anxiety disorder, unspecified: Secondary | ICD-10-CM | POA: Diagnosis not present

## 2020-08-26 DIAGNOSIS — E785 Hyperlipidemia, unspecified: Secondary | ICD-10-CM

## 2020-08-26 DIAGNOSIS — F32A Depression, unspecified: Secondary | ICD-10-CM

## 2020-08-26 DIAGNOSIS — Z794 Long term (current) use of insulin: Secondary | ICD-10-CM

## 2020-08-26 NOTE — Progress Notes (Signed)
Chronic Care Management Pharmacy Note  08/26/2020 Name:  Nicholas Whitehead MRN:  [id number] DOB:  11/18/1955  Summary: -Pt reports Freestyle Elenor Legato is now functioning and sugars have been "green" most of the time  Recommendations/Changes made from today's visit: -Recommend to continue current medication   Subjective: Nicholas Whitehead is an 65 y.o. year old male who is a primary patient of [name], MD.  The CCM team was consulted for assistance with disease management and care coordination needs.    Engaged with patient by telephone for follow up visit in response to provider referral for pharmacy case management and/or care coordination services.   Consent to Services:  The patient was given information about Chronic Care Management services, agreed to services, and gave verbal consent prior to initiation of services.  Please see initial visit note for detailed documentation.   Patient Care Team: [name], MD as PCP - General (Internal Medicine) [name], Okey Regal, MD as PCP - Infectious Diseases (Infectious Diseases) Merita Norton, LCSW as Counselor Lenord Fellers, Cleaster Corin, [facility] as Pharmacist (Pharmacist) Calvert Cantor, MD as Consulting Physician (Ophthalmology)  Recent office visits: 04/29/20 Dr [name] OV: CPX. C/o hypoglycemia in 70s. Rx'd glucagon.  01/19/20 Dr [name] OV: c/o LE edema, had stoped loop diuretic. Advised to restart furosemide and ordered ECHO.  09/22/19 Dr [name] OV: dc'd HCTZ due to BP down to 118. Refilled gabapentin.   Recent consult visits: 06/15/20 Dr Dwyane Dee (endocrine): no changes. Freestyle reader faulty, working on getting it set up. 03/09/20 Dr Dwyane Dee (endocrine): f/u DM; counseled Freestyle Libre 01/29/20 Dr Sherryle Lis (podiatry): planning foot surgery for February.  01/26/20 Dr Linus Salmons (ID): f/u HIV, doing well. Pt c/o depression, referred to psych at patient's request.  09/18/19 Dr Dwyane Dee (endcorine): DM dx 1998. Insulin since 2006. BG higher than expected based  on A1c but fructosamine indicates improved control on Ozempic. Advised to take Novolog 25 min prior to meals. Increased Toujeo to 90 units.   07/10/19 Dr Dwyane Dee (endcorine): given new glucometer. Go back to Ozempic 0.25 mg weekly. Reduce Novolog to 25 units if BG is low-normal <100   Hospital visits: None in previous 6 months  Objective:  Lab Results  Component Value Date   CREATININE 1.32 06/10/2020   BUN 18 06/10/2020   GFR 57.77 (L) 06/10/2020   GFRNONAA 77 01/12/2020   GFRAA 90 01/12/2020   NA 141 06/10/2020   K 4.2 06/10/2020   CALCIUM 9.7 06/10/2020   CO2 29 06/10/2020    Lab Results  Component Value Date/Time   HGBA1C 6.7 (H) 06/10/2020 09:24 AM   HGBA1C 6.7 (H) 02/20/2020 07:55 AM   FRUCTOSAMINE 280 09/11/2019 08:12 AM   FRUCTOSAMINE 298 (H) 04/28/2019 11:26 AM   GFR 57.77 (L) 06/10/2020 09:24 AM   GFR 79.82 02/20/2020 07:55 AM   MICROALBUR 1.4 04/29/2020 02:17 PM   MICROALBUR 1.0 02/19/2019 10:31 AM    Last diabetic Eye exam:  Lab Results  Component Value Date/Time   HMDIABEYEEXA Retinopathy (A) 07/20/2020 12:00 AM    Last diabetic Foot exam:  Lab Results  Component Value Date/Time   HMDIABFOOTEX done 05/21/2013 12:00 AM     Lab Results  Component Value Date   CHOL 109 06/10/2020   HDL 39.70 06/10/2020   LDLCALC 38 06/10/2020   TRIG 154.0 (H) 06/10/2020   CHOLHDL 3 06/10/2020    Hepatic Function Latest Ref Rng & Units 06/10/2020 02/20/2020 01/12/2020  Total Protein 6.0 - 8.3 g/dL 7.9 8.1 7.5  Albumin 3.5 -  5.2 g/dL 4.5 4.4 -  AST 0 - 37 U/L _0 ALT 0 - 53 U/L _1 Alk Phosphatase 39 - 117 U/L 41 35(L) -  Total Bilirubin 0.2 - 1.2 mg/dL 0.3 0.3 0.2    Lab Results  Component Value Date/Time   TSH 1.45 01/19/2020 10:43 AM   TSH 1.21 03/20/2019 12:30 PM    CBC Latest Ref Rng & Units 01/12/2020 03/25/2019 03/18/2018  WBC 3.8 - 10.8 Thousand/uL 7.0 7.3 5.6  Hemoglobin 13.2 - 17.1 g/dL 14.4 15.6 15.3  Hematocrit 38.5 - 50.0 % 42.4 45.5 44.4   Platelets 140 - 400 Thousand/uL 160 189.0 166    Lab Results  Component Value Date/Time   VD25OH 56.40 03/20/2019 12:30 PM   VD25OH 34.66 10/23/2017 10:33 AM    Clinical ASCVD: No  The ASCVD Risk score ([place]., et al., 2013) failed to calculate for the following reasons:   The valid total cholesterol range is 130 to 320 mg/dL    Depression screen [facility] 2/9 10/27/2019 10/27/2019 07/02/2019  Decreased Interest 0 0 0  Down, Depressed, Hopeless _2 PHQ - 2 Score _3 Altered sleeping - - -  Tired, decreased energy - - -  Change in appetite - - -  Feeling bad or failure about yourself  - - -  Trouble concentrating - - -  Moving slowly or fidgety/restless - - -  Suicidal thoughts - - -  PHQ-9 Score - - -  Difficult doing work/chores - - -  Some recent data might be hidden    Social History   Tobacco Use  Smoking Status Every Day   Packs/day: 0.25   Types: Cigarettes  Smokeless Tobacco Never   BP Readings from Last 3 Encounters:  07/02/20 (!) 153/79  06/15/20 (!) 142/82  04/29/20 138/78   Pulse Readings from Last 3 Encounters:  07/02/20 65  06/15/20 60  04/29/20 60   Wt Readings from Last 3 Encounters:  07/02/20 196 lb 9.6 oz (89.2 kg)  06/15/20 202 lb (91.6 kg)  04/29/20 196 lb (88.9 kg)   BMI Readings from Last 3 Encounters:  07/02/20 24.57 kg/m  06/15/20 25.25 kg/m  04/29/20 24.50 kg/m     Assessment/Interventions: Review of patient past medical history, allergies, medications, health status, including review of consultants reports, laboratory and other test data, was performed as part of comprehensive evaluation and provision of chronic care management services.   SDOH:  (Social Determinants of Health) assessments and interventions performed: Yes   SDOH Screenings   Alcohol Screen: Low Risk    Last Alcohol Screening Score (AUDIT): 0  Depression (PHQ2-9): Low Risk    PHQ-2 Score: 1  Financial Resource Strain: Low Risk    Difficulty of  Paying Living Expenses: Not hard at all  Food Insecurity: No Food Insecurity   Worried About Charity fundraiser in the Last Year: Never true   Ran Out of Food in the Last Year: Never true  Housing: Low Risk    Last Housing Risk Score: 0  Physical Activity: Inactive   Days of Exercise per Week: 0 days   Minutes of Exercise per Session: 0 min  Social Connections: Socially Isolated   Frequency of Communication with Friends and Family: More than three times a week   Frequency of Social Gatherings with Friends and Family: More than three times a week   Attends Religious Services: Never   Active Member of  Clubs or Organizations: No   Attends Archivist Meetings: Never   Marital Status: Never married  Stress: No Stress Concern Present   Feeling of Stress : Not at all  Tobacco Use: High Risk   Smoking Tobacco Use: Every Day   Smokeless Tobacco Use: Never  Transportation Needs: No Transportation Needs   Lack of Transportation (Medical): No   Lack of Transportation (Non-Medical): No     CCM Care Plan  Allergies  Allergen Reactions   Benazepril Cough   Molds & Smuts     Hard to breathe   Pravastatin Other (See Comments)    myositis   Glipizide Nausea Only    Medications Reviewed Today     Reviewed by Dorris Singh, CMA (Certified Medical Assistant) on 08/25/20 at 1347  Med List Status: <None>   Medication Order Taking? Sig Documenting Provider Last Dose Status Informant  albuterol (PROVENTIL HFA;VENTOLIN HFA) 108 (90 Base) MCG/ACT inhaler [id number] Yes Inhale 2 puffs into the lungs every 6 (six) hours as needed for wheezing or shortness of breath. [name], MD Taking Active            Med Note Iva Lento, Octavio Graves   Fri Mar 01, 2018 12:09 PM)    Alcohol Swabs (B-D SINGLE USE SWABS REGULAR) PADS [id number] Yes USE AS DIRECTED [name], MD Taking Active   aspirin EC 81 MG tablet [id number] Yes Take 81 mg by mouth every morning. [provider] Taking  Active Self           Med Note Arnette Schaumann Oct 25, 2016 11:45 AM)    BEVESPI AEROSPHERE 9-4.8 MCG/ACT AERO [id number] Yes INHALE 2 PUFFS INTO THE LUNGS TWO TIMES DAILY [name], MD Taking Active   cetirizine (ZYRTEC) 10 MG tablet [id number] Yes Take 10 mg by mouth daily. [provider] Taking Active Self           Med Note Arnette Schaumann Oct 25, 2016 11:45 AM)    Cholecalciferol 2000 units TABS [id number] Yes Take 1 tablet (2,000 Units total) by mouth daily. [name], MD Taking Active   clotrimazole-betamethasone Donalynn Furlong) cream [id number] Yes Apply 1 application topically 2 (two) times daily. [name], DPM Taking Active   DULoxetine (CYMBALTA) 60 MG capsule [id number] Yes TAKE 1 CAPSULE(60 MG) BY MOUTH DAILY [name], MD Taking Active   emtricitabine-rilpivir-tenofovir AF (ODEFSEY) 200-25-25 MG TABS tablet [id number] Yes Take 1 tablet by mouth daily. Thayer Headings, MD Taking Active   Eszopiclone 3 MG TABS [id number] Yes TAKE 1 TABLET BY MOUTH AT BEDTIME [name], MD Taking Active   furosemide (LASIX) 20 MG tablet [id number] Yes Take 1 tablet (20 mg total) by mouth 2 (two) times daily. [name], MD Taking Active   gabapentin (NEURONTIN) 300 MG capsule [id number] Yes TAKE 1 CAPSULE BY MOUTH THREE TIMES DAILY [name], MD Taking Active   Glucagon (GVOKE HYPOPEN 2-PACK) 1 MG/0.2ML SOAJ [id number] Yes Inject 1 Act into the skin daily as needed. [name], MD Taking Active   Glucagon, rDNA, (GLUCAGON EMERGENCY) 1 MG KIT [id number] Yes SMARTSIG:1 SUB-Q As Directed [provider] Taking Active   glucose blood (ACCU-CHEK GUIDE) test strip [id number] Yes TEST BLOOD SUGAR FOUR TIMES DAILY [name], MD Taking Active   insulin glargine, 2 Unit Dial, (TOUJEO MAX SOLOSTAR) 300 UNIT/ML Solostar Pen [id number] Yes ADMINISTER 86 UNITS UNDER  THE SKIN DAILY Elayne Snare, MD Taking Active   insulin lispro (HUMALOG KWIKPEN) 100  UNIT/ML KwikPen [id number] Yes Inject 25-30 Units into the skin 3 (three) times daily. Elayne Snare, MD Taking Active   Insulin Pen Needle (DROPLET PEN NEEDLES) 31G X 8 MM MISC [id number] Yes USE TO INJECT INSULIN [name], MD Taking Active   Lancets [place] [id number] Yes Use to test blood sugar tid. DX: E11.8 [name], MD Taking Active   linaclotide Rolan Lipa) 72 MCG capsule [id number] Yes Take 1 capsule (72 mcg total) by mouth daily before breakfast. [name], MD Taking Active   metFORMIN (GLUCOPHAGE-XR) 500 MG 24 hr tablet [id number] Yes TAKE 2 TABLETS TWICE DAILY [name], MD Taking Active   Nebivolol HCl 20 MG TABS [id number] Yes TAKE 1 TABLET(20 MG) BY MOUTH DAILY [name], MD Taking Active   olmesartan (BENICAR) 40 MG tablet [id number] Yes TAKE 1 TABLET(40 MG) BY MOUTH DAILY [name], MD Taking Active   OZEMPIC, 0.25 OR 0.5 MG/DOSE, 2 MG/1.5ML SOPN [id number] Yes START WITH INJECTING 0.25 MG UNDER THE SKIN WEEKLY FOR 4 WEEKS, THEN INJECT 0.5 MG UNDER THE SKIN ONCE A WEEK Elayne Snare, MD Taking Active   PFIZER-BIONT COVID-19 VAC-TRIS SUSP injection [id number] Yes  [provider] Taking Active   pioglitazone (ACTOS) 30 MG tablet [id number] Yes Take 1 tablet (30 mg total) by mouth daily. Elayne Snare, MD Taking Active   Pitavastatin Calcium (LIVALO) 1 MG TABS [id number] Yes TAKE 1 TABLET(1 MG) BY MOUTH DAILY [name], MD Taking Active   traZODone (DESYREL) 150 MG tablet [id number] Yes TAKE 3 TABLETS(450 MG) BY MOUTH AT BEDTIME [name], MD Taking Active   trazodone (DESYREL) 300 MG tablet [id number] Yes TAKE 1 TABLET AT BEDTIME [name], MD Taking Active   triamcinolone cream (KENALOG) 0.5 % [id number] Yes APPLY 1 APPLICATION TOPICALLY THREE TIME A Leanora Cover, MD Taking Active             Patient Active Problem List   Diagnosis Date Noted   Hypertensive left ventricular hypertrophy, without heart failure 04/29/2020   Grade II  diastolic dysfunction 41/28/7867   Bilateral leg edema 01/19/2020   Eczema 07/02/2019   Chronic renal disease, stage 3, moderately decreased glomerular filtration rate (GFR) between 30-59 mL/min/1.73 square meter 06/25/2018   Vitamin D deficiency 04/04/2017   Uncontrolled type 2 diabetes mellitus with hyperglycemia, with long-term current use of insulin ([place]) 04/04/2017   Tobacco abuse 12/28/2016   Chronic idiopathic constipation 10/24/2016   Insomnia due to anxiety and fear 08/22/2016   Intermittent claudication ([place]) 02/07/2016   Drug-induced erectile dysfunction 09/06/2015   Simple chronic bronchitis ([place]) 05/06/2015   Mild cognitive impairment 05/06/2015   Type 2 diabetes mellitus with diabetic neuropathy, with long-term current use of insulin ([place]) 04/30/2015   Nonspecific abnormal electrocardiogram (ECG) (EKG) 03/25/2015   Screening examination for venereal disease 08/12/2013   Routine general medical examination at a health care facility 05/21/2013   BPH (benign prostatic hyperplasia) 05/21/2013   DJD of shoulder 05/21/2013   Anxiety and depression 04/18/2011   Hyperlipidemia LDL goal <100    Diabetes mellitus with diabetic neuropathy ([place])    Hypertension    HIV disease ([place]) 09/12/1995    Immunization History  Administered Date(s) Administered   Hepatitis A 01/29/2002   Influenza Split 09/26/2011   Influenza Whole 10/14/2001, 09/02/2010   Influenza, High Dose Seasonal PF 09/06/2015   Influenza,inj,Quad  PF,6+ Mos 09/19/2012, 09/11/2013, 09/22/2014, 03/02/2015, 10/24/2016, 10/23/2017, 09/25/2018, 12/24/2019   PFIZER(Purple Top)SARS-COV-2 Vaccination 09/30/2019, 10/21/2019   PPD Test 09/14/1995   Pneumococcal Conjugate-13 01/21/2014   Pneumococcal Polysaccharide-23 12/16/2001, 09/02/2010, 09/06/2015   Tdap 05/21/2013    Conditions to be addressed/monitored:  Hypertension, Hyperlipidemia, Diabetes, Depression and Anxiety  Patient Care Plan: CCM Pharmacy Care Plan      Problem Identified: Hypertension, Hyperlipidemia, Diabetes, Depression and Anxiety   Priority: High     Goal: Disease management   Start Date: 02/20/2020  Expected End Date: 08/19/2020  This Visit's Progress: On track  Recent Progress: On track  Priority: High  Note:   Current Barriers:  Unable to independently monitor therapeutic efficacy  Pharmacist Clinical Goal(s):  Patient will achieve adherence to monitoring guidelines and medication adherence to achieve therapeutic efficacy through collaboration with PharmD and provider.   Interventions: 1:1 collaboration with [name], MD regarding development and update of comprehensive plan of care as evidenced by provider attestation and co-signature Inter-disciplinary care team collaboration (see longitudinal plan of care) Comprehensive medication review performed; medication list updated in electronic medical record  Hypertension (BP goal < 130/80) Controlled - pt endorses compliance and denies issues Current regimen:  Bystolic 20 mg daily Olmesartan 40 mg daily Furosemide 20 mg twice a day Interventions: Discussed BP goals and benefits of medications for prevention of heart attack / stroke Recommend to continue current medication   Hyperlipidemia (LDL goal < 100) Controlled - LDL is at goal; pt endorses compliance and denies issues Current regimen:  Livalo 1 mg daily Aspirin 81 mg daily Interventions: Discussed cholesterol goals and benefits of medications for prevention of heart attack / stroke Recommend to continue current medication   Diabetes (A1c goal < 7%) Controlled - most recent A1c 6.7, however pt c/o of some lows. He recently started [facility] but has had trouble with sensors fallings off so he is not currently wearing it. Current regimen (per Dr Dwyane Dee):  Toujeo Max 86 units daily Novolog 25-30 units before meals Metformin ER 500 mg - 2 tab twice a day Pioglitazone 30 mg daily Ozempic 0.25 mg  weekly Freestyle Libre 2 Interventions: Discussed blood sugar goals and benefits of medications for prevention of diabetic complications Counseled on [employer] application technique; advised to try Tegaderm over sensor to make it more secure Recommend to continue current medication   Depression / Insomnia Improved - pt is in process of enrolling with psychiatry Current regimen:  Duloxetine 60 mg daily Trazodone 450 mg at bedtime Eszoplicone 3 mg at bedtime Interventions: Discussed benefits and safety risks with medications Pt is awaiting scheduling with psychiatry Recommend to continue current medication  Patient Goals/Self-Care Activities Patient will:  - take medications as prescribed focus on medication adherence by pill box check glucose daily via [facility], document, and provide at future appointments  Follow Up Plan: Telephone follow up appointment with care management team member scheduled for: 3 months     Medication Assistance: None required.  Patient affirms current coverage meets needs.  Compliance/Adherence/Medication fill history: Care Gaps: Foot exam (due 07/09/20)  Star-Rating Drugs: Metformin - LF 06/11/20 x 90 ds Ozempic - LF 07/22/20 x 56 ds Livalo - LF 07/19/20 x 90 ds Pioglitazone - LF 06/22/20 x 90 ds Olmesartan - LF 06/29/20 x 90 ds  Patient's preferred pharmacy is:  [id number] [address], [address] AT [address] [place] [address] Phone: [phone] Fax: [facility] #[id number] [address], [place] - [address] AT [address] [place] [address] Phone: [phone] Fax: [facility] #[id number] [address], [address] [place] [address] Phone: [phone] Fax: [phone]  Uses pill box? Yes Pt endorses 100% compliance  We discussed: Current  pharmacy is preferred with insurance plan and patient is satisfied with pharmacy services Patient decided to: Continue current medication management strategy  Care Plan and Follow Up Patient Decision:  Patient agrees to Care Plan and Follow-up.  Plan: Telephone follow up appointment with care management team member scheduled for:  6 months  Charlene Brooke, PharmD, [place], CPP Clinical Pharmacist [facility] at [facility] [phone]

## 2020-08-26 NOTE — Patient Instructions (Signed)
Visit Information  Phone number for Pharmacist: [phone]   Goals Addressed             This Visit's Progress    Manage My Medicine       Timeframe:  Long-Range Goal Priority:  High Start Date:   02/20/20                          Expected End Date:   03/08/21            Follow Up Date Feb 2023   - call for medicine refill 2 or 3 days before it runs out - call if I am sick and can't take my medicine - keep a list of all the medicines I take; vitamins and herbals too - use a pillbox to sort medicine    Why is this important?   These steps will help you keep on track with your medicines.        Care Plan : [place]  Updates made by Charlton Haws, RPH since 08/26/2020 12:00 AM     Problem: Hypertension, Hyperlipidemia, Diabetes, Depression and Anxiety   Priority: High     Goal: Disease management   Start Date: 02/20/2020  Expected End Date: 08/19/2020  This Visit's Progress: On track  Recent Progress: On track  Priority: High  Note:   Current Barriers:  Unable to independently monitor therapeutic efficacy  Pharmacist Clinical Goal(s):  Patient will achieve adherence to monitoring guidelines and medication adherence to achieve therapeutic efficacy through collaboration with PharmD and provider.   Interventions: 1:1 collaboration with [name], MD regarding development and update of comprehensive plan of care as evidenced by provider attestation and co-signature Inter-disciplinary care team collaboration (see longitudinal plan of care) Comprehensive medication review performed; medication list updated in electronic medical record  Hypertension (BP goal < 130/80) Controlled - pt endorses compliance and denies issues Current regimen:  Nebivolol 20 mg daily Olmesartan 40 mg daily Furosemide 20 mg twice a day Interventions: Discussed BP goals and benefits of medications for prevention of heart attack / stroke Recommend to continue  current medication   Hyperlipidemia (LDL goal < 100) Controlled - LDL is at goal; pt endorses compliance and denies issues Current regimen:  Livalo 1 mg daily Aspirin 81 mg daily Interventions: Discussed cholesterol goals and benefits of medications for prevention of heart attack / stroke Recommend to continue current medication   Diabetes (A1c goal < 7%) Controlled - previously had trouble with [facility], now functioning correctly and pt reports sugars in in "green" range most of the time; he denies low sugars Current regimen (per Dr Dwyane Dee):  Toujeo Max 86 units daily Humalog 25-30 units before meals Metformin ER 500 mg - 2 tab twice a day Pioglitazone 30 mg daily Ozempic 0.5 mg weekly Gvoke Hyppen PRN Freestyle Libre 2 Interventions: Discussed blood sugar goals and benefits of medications for prevention of diabetic complications Recommend to continue current medication   Depression / Insomnia Improved - pt is in process of enrolling with psychiatry Current regimen:  Duloxetine 60 mg daily Trazodone 450 mg at bedtime Eszoplicone 3 mg at bedtime Interventions: Pt is awaiting scheduling with psychiatry Recommend to continue current medication  Patient Goals/Self-Care Activities Patient will:  - take medications as prescribed -focus on medication adherence by pill box -check glucose daily via [employer]       The patient verbalized understanding of instructions, educational materials, and  care plan provided today and declined offer to receive copy of patient instructions, educational materials, and care plan.  Telephone follow up appointment with pharmacy team member scheduled for: 6 months  Charlene Brooke, PharmD, [place], CPP Clinical Pharmacist [facility] at [facility] [phone]

## 2020-08-29 ENCOUNTER — Encounter: Payer: Self-pay | Admitting: Podiatry

## 2020-08-29 NOTE — Progress Notes (Signed)
Subjective: Nicholas Whitehead is a pleasant 65 y.o. male patient seen today for at risk foot care with history of diabetic neuropathy painful thick toenails that are difficult to trim. Pain interferes with ambulation. Aggravating factors include wearing enclosed shoe gear. Pain is relieved with periodic professional debridement.  He had right great toe arthrodesis with excision of wound performed by Dr. Sherryle Lis for ulceration on 02/27/2020. Incision has healed and he has an asymptomatic maluion of great toe. Mr Slader is aware and relates no issues with the digit on today's visit.   Patient states their blood glucose was 200 mg/dl on today.  PCP is [name], MD. Last visit was: 04/29/2020.  Allergies  Allergen Reactions   Benazepril Cough   Molds & Smuts     Hard to breathe   Pravastatin Other (See Comments)    myositis   Glipizide Nausea Only    Objective: Physical Exam  General: Nicholas Whitehead is a pleasant 65 y.o. African American male, WD, WN in NAD. AAO x 3.   Vascular:  Capillary refill time to digits immediate b/l. Palpable DP pulse(s) b/l lower extremities Palpable PT pulse(s) b/l lower extremities Pedal hair sparse. Lower extremity skin temperature gradient within normal limits. No pain with calf compression b/l. No edema noted b/l lower extremities.  Dermatological:  Skin warm and supple b/l lower extremities. No open wounds b/l lower extremities. No interdigital macerations b/l lower extremities. Toenails 1-5 b/l elongated, discolored, dystrophic, thickened, crumbly with subungual debris and tenderness to dorsal palpation. Hyperkeratotic lesion(s) plantar aspect of heel b/l feet.  No erythema, no edema, no drainage, no fluctuance.  Musculoskeletal:  Normal muscle strength 5/5 to all lower extremity muscle groups bilaterally. Hammertoe(s) noted to the 2-5 bilaterally and L hallux.  Neurological:  Protective sensation diminished with 10g monofilament b/l. Vibratory  sensation intact b/l.  Assessment and Plan:  1. Pain due to onychomycosis of toenails of both feet   2. Callus   3. Type 2 diabetes mellitus with diabetic neuropathy, with long-term current use of insulin ([place])      -Examined patient. -Continue diabetic foot care principles: inspect feet daily, monitor glucose as recommended by PCP and/or Endocrinologist, and follow prescribed diet per PCP, Endocrinologist and/or dietician. -Patient to continue soft, supportive shoe gear daily. -Toenails 1-5 b/l were debrided in length and girth with sterile nail nippers and dremel without iatrogenic bleeding.  -Callus(es) plantar aspect b/l heel pads pared utilizing sterile scalpel blade without complication or incident. Total number debrided =2. -Patient to report any pedal injuries to medical professional immediately. -Patient/POA to call should there be question/concern in the interim.  Return in about 3 months (around 11/25/2020).  Marzetta Board, DPM

## 2020-09-03 ENCOUNTER — Ambulatory Visit (INDEPENDENT_AMBULATORY_CARE_PROVIDER_SITE_OTHER): Payer: 59

## 2020-09-03 ENCOUNTER — Other Ambulatory Visit: Payer: Self-pay

## 2020-09-03 DIAGNOSIS — Z794 Long term (current) use of insulin: Secondary | ICD-10-CM

## 2020-09-03 DIAGNOSIS — E1140 Type 2 diabetes mellitus with diabetic neuropathy, unspecified: Secondary | ICD-10-CM

## 2020-09-03 NOTE — Progress Notes (Signed)
Patient in office today to be measured for diabetic shoes and custom inserts. Patient was measured at a size 10.5 medium men's shoe. Patient stated Dr. Dwyane Dee treats his diabetes at this time. Patient selected Whitten as his first shoe option and shoe Tobias as the back-up shoe option.

## 2020-09-21 ENCOUNTER — Other Ambulatory Visit: Payer: Self-pay | Admitting: Endocrinology

## 2020-09-21 DIAGNOSIS — Z794 Long term (current) use of insulin: Secondary | ICD-10-CM

## 2020-09-21 DIAGNOSIS — E1140 Type 2 diabetes mellitus with diabetic neuropathy, unspecified: Secondary | ICD-10-CM

## 2020-09-22 ENCOUNTER — Other Ambulatory Visit: Payer: Self-pay | Admitting: Internal Medicine

## 2020-09-22 DIAGNOSIS — I10 Essential (primary) hypertension: Secondary | ICD-10-CM

## 2020-09-22 DIAGNOSIS — E1140 Type 2 diabetes mellitus with diabetic neuropathy, unspecified: Secondary | ICD-10-CM

## 2020-09-29 ENCOUNTER — Telehealth: Payer: Self-pay | Admitting: Pharmacist

## 2020-09-29 NOTE — Progress Notes (Signed)
Chronic Care Management Pharmacy Assistant   Name: Nicholas Whitehead  MRN: [id number] DOB: 09-Dec-1955  Reason for Encounter: Disease State-Diabetes  Recent office visits:  None noted  Recent consult visits:  09/03/20 Abdul-Mutakallim, RN (Podiatry) - Type 2 diabetes mellitus with diabetic neuropathy, with long-term current use of insulin. Patient was measured for diabetic shoes and custom inserts.  Hospital visits:  None in previous 6 months  Medications: Outpatient Encounter Medications as of 09/29/2020  Medication Sig   albuterol (PROVENTIL HFA;VENTOLIN HFA) 108 (90 Base) MCG/ACT inhaler Inhale 2 puffs into the lungs every 6 (six) hours as needed for wheezing or shortness of breath.   Alcohol Swabs (B-D SINGLE USE SWABS REGULAR) PADS USE AS DIRECTED   aspirin EC 81 MG tablet Take 81 mg by mouth every morning.   BEVESPI AEROSPHERE 9-4.8 MCG/ACT AERO INHALE 2 PUFFS INTO THE LUNGS TWO TIMES DAILY   cetirizine (ZYRTEC) 10 MG tablet Take 10 mg by mouth daily.   Cholecalciferol 2000 units TABS Take 1 tablet (2,000 Units total) by mouth daily.   clotrimazole-betamethasone (LOTRISONE) cream Apply 1 application topically 2 (two) times daily.   DULoxetine (CYMBALTA) 60 MG capsule TAKE 1 CAPSULE(60 MG) BY MOUTH DAILY   emtricitabine-rilpivir-tenofovir AF (ODEFSEY) 200-25-25 MG TABS tablet Take 1 tablet by mouth daily.   Eszopiclone 3 MG TABS TAKE 1 TABLET BY MOUTH AT BEDTIME   furosemide (LASIX) 20 MG tablet Take 1 tablet (20 mg total) by mouth 2 (two) times daily.   gabapentin (NEURONTIN) 300 MG capsule TAKE 1 CAPSULE BY MOUTH THREE TIMES DAILY   Glucagon (GVOKE HYPOPEN 2-PACK) 1 MG/0.2ML SOAJ Inject 1 Act into the skin daily as needed.   Glucagon, rDNA, (GLUCAGON EMERGENCY) 1 MG KIT SMARTSIG:1 SUB-Q As Directed   glucose blood (ACCU-CHEK GUIDE) test strip TEST BLOOD SUGAR FOUR TIMES DAILY   HUMALOG KWIKPEN 100 UNIT/ML KwikPen ADMINISTER 25 TO 30 UNITS UNDER THE SKIN THREE TIMES DAILY    insulin glargine, 2 Unit Dial, (TOUJEO MAX SOLOSTAR) 300 UNIT/ML Solostar Pen ADMINISTER 86 UNITS UNDER THE SKIN DAILY   Insulin Pen Needle (DROPLET PEN NEEDLES) 31G X 8 MM MISC USE TO INJECT INSULIN   Lancets MISC Use to test blood sugar tid. DX: E11.8   linaclotide (LINZESS) 72 MCG capsule Take 1 capsule (72 mcg total) by mouth daily before breakfast.   metFORMIN (GLUCOPHAGE-XR) 500 MG 24 hr tablet TAKE 2 TABLETS TWICE DAILY   Nebivolol HCl 20 MG TABS TAKE 1 TABLET(20 MG) BY MOUTH DAILY   olmesartan (BENICAR) 40 MG tablet TAKE 1 TABLET(40 MG) BY MOUTH DAILY   OZEMPIC, 0.25 OR 0.5 MG/DOSE, 2 MG/1.5ML SOPN START WITH INJECTING 0.25 MG UNDER THE SKIN WEEKLY FOR 4 WEEKS, THEN INJECT 0.5 MG UNDER THE SKIN ONCE A WEEK   PFIZER-BIONT COVID-19 VAC-TRIS SUSP injection    pioglitazone (ACTOS) 30 MG tablet TAKE 1 TABLET(30 MG) BY MOUTH DAILY   Pitavastatin Calcium (LIVALO) 1 MG TABS TAKE 1 TABLET(1 MG) BY MOUTH DAILY   traZODone (DESYREL) 150 MG tablet TAKE 3 TABLETS(450 MG) BY MOUTH AT BEDTIME   trazodone (DESYREL) 300 MG tablet TAKE 1 TABLET AT BEDTIME   triamcinolone cream (KENALOG) 0.5 % APPLY 1 APPLICATION TOPICALLY THREE TIME A DAY   No facility-administered encounter medications on file as of 09/29/2020.   Recent Relevant Labs: Lab Results  Component Value Date/Time   HGBA1C 6.7 (H) 06/10/2020 09:24 AM   HGBA1C 6.7 (H) 02/20/2020 07:55 AM   MICROALBUR 1.4 04/29/2020 02:17  PM   MICROALBUR 1.0 02/19/2019 10:31 AM    Kidney Function Lab Results  Component Value Date/Time   CREATININE 1.32 06/10/2020 09:24 AM   CREATININE 1.01 02/20/2020 07:55 AM   CREATININE 1.03 01/12/2020 11:30 AM   CREATININE 0.99 03/18/2018 09:54 AM   GFR 57.77 (L) 06/10/2020 09:24 AM   GFRNONAA 77 01/12/2020 11:30 AM   GFRAA 90 01/12/2020 11:30 AM     Contacted patient on 10/04/20 to discuss Diabetes disease state.   Current antihyperglycemic regimen:  Novolog 25-30 units before meals Metformin ER 500 mg - 2  tab twice a day Pioglitazone 30 mg daily Ozempic 0.25 mg weekly Freestyle Libre 2  Patient verbally confirms he is taking the above medications as directed.  Patient states he is waiting on his Freestyle Libre 2 patches to come in he has been out for a week now and pricking his finger instead.  What diet changes have been made to improve diabetes control? Patient states he's trying to eat more veggies but he need some dental done which prevents him from eating.   What recent interventions/DTPs have been made to improve glycemic control:  None noted  Have there been any recent hospitalizations or ED visits since last visit with CPP? No  Patient denies hypoglycemic symptoms, including None  Patient reports hyperglycemic symptoms, including blurry vision  How often are you checking your blood sugar? once daily  What are your blood sugars ranging?  Patient states his readings has been running 140 - 200.  During the week, how often does your blood glucose drop below 70? Never  Are you checking your feet daily/regularly?  Patient states his foot is in pain a lot of times where he had surgery at. Still waiting on diabetic shoes to arrive they were ordered in August.   Adherence Review: Is the patient currently on a STATIN medication? Yes Is the patient currently on ACE/ARB medication? Yes Does the patient have >5 day gap between last estimated fill dates? No  Care Gaps: Annual wellness visit in last year? Yes 04/29/20 Most Recent BP reading: 153/79 - 07/02/20  If Diabetic: Most recent A1C reading: 6.7 - 06/10/20 Last eye exam / retinopathy screening: June or July 2022 Last diabetic foot exam: 08/25/20  Star Rating Drugs:  Medication:  Last Fill: Day Supply Metformin  09/09/20   90 Olmesartan  09/22/20 90 Ozempic  09/16/20  56 Pitavastatin  07/19/20 90  Endocrinology appointment on 10/19/20 . Patient states he has a dental appt. on 10/05/20 for a cleaning.  Orinda Kenner,  [place] Clinical Pharmacists Assistant [phone]  Time Spent: [phone]

## 2020-10-19 ENCOUNTER — Other Ambulatory Visit: Payer: Self-pay

## 2020-10-19 ENCOUNTER — Encounter: Payer: 59 | Admitting: Endocrinology

## 2020-10-19 NOTE — Progress Notes (Signed)
8

## 2020-10-19 NOTE — Progress Notes (Signed)
This encounter was created in error - please disregard.

## 2020-10-25 ENCOUNTER — Other Ambulatory Visit: Payer: Self-pay | Admitting: Internal Medicine

## 2020-10-25 DIAGNOSIS — E785 Hyperlipidemia, unspecified: Secondary | ICD-10-CM

## 2020-10-26 ENCOUNTER — Ambulatory Visit: Payer: 59 | Admitting: Podiatry

## 2020-10-29 ENCOUNTER — Encounter: Payer: Self-pay | Admitting: Podiatrist

## 2020-10-29 ENCOUNTER — Other Ambulatory Visit: Payer: Self-pay

## 2020-10-29 ENCOUNTER — Ambulatory Visit (INDEPENDENT_AMBULATORY_CARE_PROVIDER_SITE_OTHER): Payer: 59 | Admitting: Podiatrist

## 2020-10-29 DIAGNOSIS — M2041 Other hammer toe(s) (acquired), right foot: Secondary | ICD-10-CM | POA: Diagnosis not present

## 2020-10-29 DIAGNOSIS — M2141 Flat foot [pes planus] (acquired), right foot: Secondary | ICD-10-CM | POA: Diagnosis not present

## 2020-10-29 DIAGNOSIS — E1140 Type 2 diabetes mellitus with diabetic neuropathy, unspecified: Secondary | ICD-10-CM

## 2020-10-29 DIAGNOSIS — M2142 Flat foot [pes planus] (acquired), left foot: Secondary | ICD-10-CM

## 2020-10-29 DIAGNOSIS — Z794 Long term (current) use of insulin: Secondary | ICD-10-CM | POA: Diagnosis not present

## 2020-10-29 DIAGNOSIS — M2042 Other hammer toe(s) (acquired), left foot: Secondary | ICD-10-CM

## 2020-10-29 NOTE — Progress Notes (Signed)
The patient presented to the office to day to pick up diabetic shoes and 3 pr diabetic custom inserts.  1 pr of  inserts were put in the shoes and the shoes were fitted to the patient.  The patient states they are comfortable and free of defect.  The patient was satisfied with the fit of the shoe.  Instructions for break in and wear were dispensed.  The  delivery documentation and break in instruction forms were signed and a copy of the paperwork was given to the patient.

## 2020-11-29 ENCOUNTER — Other Ambulatory Visit: Payer: Self-pay | Admitting: Endocrinology

## 2020-11-29 ENCOUNTER — Ambulatory Visit: Payer: 59 | Admitting: Podiatry

## 2020-12-09 ENCOUNTER — Other Ambulatory Visit: Payer: Self-pay | Admitting: Internal Medicine

## 2020-12-09 ENCOUNTER — Ambulatory Visit: Payer: 59 | Admitting: Endocrinology

## 2020-12-09 DIAGNOSIS — F409 Phobic anxiety disorder, unspecified: Secondary | ICD-10-CM

## 2020-12-15 ENCOUNTER — Other Ambulatory Visit: Payer: Self-pay | Admitting: Internal Medicine

## 2020-12-15 DIAGNOSIS — E1140 Type 2 diabetes mellitus with diabetic neuropathy, unspecified: Secondary | ICD-10-CM

## 2020-12-20 ENCOUNTER — Encounter: Payer: Self-pay | Admitting: Internal Medicine

## 2020-12-20 ENCOUNTER — Other Ambulatory Visit: Payer: Self-pay

## 2020-12-20 ENCOUNTER — Ambulatory Visit (INDEPENDENT_AMBULATORY_CARE_PROVIDER_SITE_OTHER): Payer: 59 | Admitting: Internal Medicine

## 2020-12-20 VITALS — BP 160/78 | HR 56 | Temp 98.0°F | Resp 16 | Ht 73.0 in | Wt 197.0 lb

## 2020-12-20 DIAGNOSIS — Z23 Encounter for immunization: Secondary | ICD-10-CM

## 2020-12-20 DIAGNOSIS — I10 Essential (primary) hypertension: Secondary | ICD-10-CM

## 2020-12-20 DIAGNOSIS — Z794 Long term (current) use of insulin: Secondary | ICD-10-CM

## 2020-12-20 DIAGNOSIS — E0840 Diabetes mellitus due to underlying condition with diabetic neuropathy, unspecified: Secondary | ICD-10-CM | POA: Diagnosis not present

## 2020-12-20 DIAGNOSIS — E1140 Type 2 diabetes mellitus with diabetic neuropathy, unspecified: Secondary | ICD-10-CM | POA: Diagnosis not present

## 2020-12-20 LAB — URINALYSIS, ROUTINE W REFLEX MICROSCOPIC
Bilirubin Urine: NEGATIVE
Hgb urine dipstick: NEGATIVE
Ketones, ur: NEGATIVE
Leukocytes,Ua: NEGATIVE
Nitrite: NEGATIVE
RBC / HPF: NONE SEEN (ref 0–?)
Specific Gravity, Urine: 1.01 (ref 1.000–1.030)
Total Protein, Urine: NEGATIVE
Urine Glucose: >=1000 — AB
Urobilinogen, UA: 0.2 (ref 0.0–1.0)
pH: 6.5 (ref 5.0–8.0)

## 2020-12-20 LAB — CBC WITH DIFFERENTIAL/PLATELET
Basophils Absolute: 0 10*3/uL (ref 0.0–0.1)
Basophils Relative: 0.2 % (ref 0.0–3.0)
Eosinophils Absolute: 0.1 10*3/uL (ref 0.0–0.7)
Eosinophils Relative: 1.5 % (ref 0.0–5.0)
HCT: 39.4 % (ref 39.0–52.0)
Hemoglobin: 13.3 g/dL (ref 13.0–17.0)
Lymphocytes Relative: 29.7 % (ref 12.0–46.0)
Lymphs Abs: 1.9 10*3/uL (ref 0.7–4.0)
MCHC: 33.7 g/dL (ref 30.0–36.0)
MCV: 97.8 fl (ref 78.0–100.0)
Monocytes Absolute: 0.5 10*3/uL (ref 0.1–1.0)
Monocytes Relative: 8.5 % (ref 3.0–12.0)
Neutro Abs: 3.8 10*3/uL (ref 1.4–7.7)
Neutrophils Relative %: 60.1 % (ref 43.0–77.0)
Platelets: 164 10*3/uL (ref 150.0–400.0)
RBC: 4.03 Mil/uL — ABNORMAL LOW (ref 4.22–5.81)
RDW: 13.8 % (ref 11.5–15.5)
WBC: 6.4 10*3/uL (ref 4.0–10.5)

## 2020-12-20 LAB — BASIC METABOLIC PANEL
BUN: 9 mg/dL (ref 6–23)
CO2: 32 mEq/L (ref 19–32)
Calcium: 9.9 mg/dL (ref 8.4–10.5)
Chloride: 103 mEq/L (ref 96–112)
Creatinine, Ser: 0.93 mg/dL (ref 0.40–1.50)
GFR: 87.62 mL/min (ref >60.00)
Glucose, Bld: 129 mg/dL — ABNORMAL HIGH (ref 70–99)
Potassium: 3.8 mEq/L (ref 3.5–5.1)
Sodium: 141 mEq/L (ref 135–145)

## 2020-12-20 LAB — HEMOGLOBIN A1C: Hgb A1c MFr Bld: 8.6 % — ABNORMAL HIGH (ref 4.6–6.5)

## 2020-12-20 MED ORDER — DAPAGLIFLOZIN PROPANEDIOL 10 MG PO TABS: 10.0000 mg | ORAL_TABLET | Freq: Every day | ORAL | 1 refills | Status: DC

## 2020-12-20 MED ORDER — TORSEMIDE 20 MG PO TABS: 20.0000 mg | ORAL_TABLET | Freq: Every day | ORAL | 0 refills | Status: DC

## 2020-12-20 NOTE — Progress Notes (Signed)
Subjective:  Patient ID: Nicholas Whitehead, male    DOB: 1955/06/09  Age: 65 y.o. MRN: [id number]  CC: Hypertension and Diabetes  This visit occurred during the SARS-CoV-2 public health emergency.  Safety protocols were in place, including screening questions prior to the visit, additional usage of staff PPE, and extensive cleaning of exam room while observing appropriate contact time as indicated for disinfecting solutions.    HPI Nicholas Whitehead presents for f/up -  According to prescription refills he would have run out of the loop diuretic but he tells me he still taking it.  He has lower extremity edema but denies chest pain, shortness of breath, diaphoresis, dizziness, lightheadedness, or near syncope.   Outpatient Medications Prior to Visit  Medication Sig Dispense Refill   albuterol (PROVENTIL HFA;VENTOLIN HFA) 108 (90 Base) MCG/ACT inhaler Inhale 2 puffs into the lungs every 6 (six) hours as needed for wheezing or shortness of breath. 1 Inhaler 5   Alcohol Swabs (B-D SINGLE USE SWABS REGULAR) PADS USE AS DIRECTED 300 each 1   aspirin EC 81 MG tablet Take 81 mg by mouth every morning.     BEVESPI AEROSPHERE 9-4.8 MCG/ACT AERO INHALE 2 PUFFS INTO THE LUNGS TWO TIMES DAILY 32.1 g 1   cetirizine (ZYRTEC) 10 MG tablet Take 10 mg by mouth daily.     Cholecalciferol 2000 units TABS Take 1 tablet (2,000 Units total) by mouth daily. 90 tablet 1   clotrimazole-betamethasone (LOTRISONE) cream Apply 1 application topically 2 (two) times daily. 30 g 2   DULoxetine (CYMBALTA) 60 MG capsule TAKE 1 CAPSULE(60 MG) BY MOUTH DAILY 90 capsule 1   emtricitabine-rilpivir-tenofovir AF (ODEFSEY) 200-25-25 MG TABS tablet Take 1 tablet by mouth daily. 90 tablet 3   Eszopiclone 3 MG TABS TAKE 1 TABLET BY MOUTH AT BEDTIME 90 tablet 0   gabapentin (NEURONTIN) 300 MG capsule TAKE 1 CAPSULE BY MOUTH THREE TIMES DAILY 270 capsule 1   Glucagon (GVOKE HYPOPEN 2-PACK) 1 MG/0.2ML SOAJ Inject 1 Act into the skin daily as  needed. 2 mL 6   Glucagon, rDNA, (GLUCAGON EMERGENCY) 1 MG KIT SMARTSIG:1 SUB-Q As Directed     glucose blood (ACCU-CHEK GUIDE) test strip TEST BLOOD SUGAR FOUR TIMES DAILY 400 strip 3   HUMALOG KWIKPEN 100 UNIT/ML KwikPen ADMINISTER 25 TO 30 UNITS UNDER THE SKIN THREE TIMES DAILY 30 mL 3   Insulin Pen Needle (DROPLET PEN NEEDLES) 31G X 8 MM MISC USE TO INJECT INSULIN 500 each 1   Lancets MISC Use to test blood sugar tid. DX: E11.8 300 each 3   linaclotide (LINZESS) 72 MCG capsule Take 1 capsule (72 mcg total) by mouth daily before breakfast. 90 capsule 1   LIVALO 1 MG TABS TAKE 1 TABLET(1 MG) BY MOUTH DAILY 90 tablet 1   metFORMIN (GLUCOPHAGE-XR) 500 MG 24 hr tablet TAKE 2 TABLETS BY MOUTH TWICE DAILY 360 tablet 0   Nebivolol HCl 20 MG TABS TAKE 1 TABLET(20 MG) BY MOUTH DAILY 90 tablet 0   olmesartan (BENICAR) 40 MG tablet TAKE 1 TABLET(40 MG) BY MOUTH DAILY 90 tablet 1   OZEMPIC, 0.25 OR 0.5 MG/DOSE, 2 MG/1.5ML SOPN INJECT 0.25MG UNDER THE SKIN WEEKLY FOR 4 WEEKS, THEN INJECT 0.5MG UNDER THE SKIN ONCE A WEEK 1.5 mL 5   pioglitazone (ACTOS) 30 MG tablet TAKE 1 TABLET(30 MG) BY MOUTH DAILY 90 tablet 1   traZODone (DESYREL) 150 MG tablet TAKE 3 TABLETS(450 MG) BY MOUTH AT BEDTIME 270 tablet 1  triamcinolone cream (KENALOG) 0.5 % APPLY 1 APPLICATION TOPICALLY THREE TIME A DAY 100 g 3   furosemide (LASIX) 20 MG tablet Take 1 tablet (20 mg total) by mouth 2 (two) times daily. 180 tablet 1   PFIZER-BIONT COVID-19 VAC-TRIS SUSP injection      trazodone (DESYREL) 300 MG tablet TAKE 1 TABLET AT BEDTIME 90 tablet 1   insulin glargine, 2 Unit Dial, (TOUJEO MAX SOLOSTAR) 300 UNIT/ML Solostar Pen ADMINISTER 86 UNITS UNDER THE SKIN DAILY 6 mL 3   No facility-administered medications prior to visit.    ROS Review of Systems  Constitutional:  Negative for chills, diaphoresis, fatigue and fever.  HENT: Negative.    Eyes: Negative.   Respiratory:  Negative for cough, chest tightness, shortness of breath  and wheezing.   Cardiovascular:  Positive for leg swelling. Negative for chest pain and palpitations.  Gastrointestinal:  Negative for abdominal pain, constipation, diarrhea, nausea and vomiting.  Endocrine: Negative.   Genitourinary: Negative.  Negative for difficulty urinating.  Musculoskeletal: Negative.  Negative for arthralgias and myalgias.  Skin: Negative.   Neurological: Negative.  Negative for dizziness, weakness and headaches.  Hematological:  Negative for adenopathy. Does not bruise/bleed easily.  Psychiatric/Behavioral: Negative.     Objective:  BP (!) 160/78 (BP Location: Right Arm, Patient Position: Sitting, Cuff Size: Large)   Pulse (!) 56   Temp 98 F (36.7 C) (Oral)   Resp 16   Ht 6' 1" (1.854 m)   Wt 197 lb (89.4 kg)   SpO2 94%   BMI 25.99 kg/m   BP Readings from Last 3 Encounters:  12/20/20 (!) 160/78  10/19/20 (!) 150/74  07/02/20 (!) 153/79    Wt Readings from Last 3 Encounters:  12/20/20 197 lb (89.4 kg)  10/19/20 200 lb (90.7 kg)  07/02/20 196 lb 9.6 oz (89.2 kg)    Physical Exam Vitals reviewed.  Constitutional:      Appearance: He is not ill-appearing.  HENT:     Nose: Nose normal.     Mouth/Throat:     Mouth: Mucous membranes are moist.  Eyes:     Conjunctiva/sclera: Conjunctivae normal.  Cardiovascular:     Rate and Rhythm: Regular rhythm. Bradycardia present.     Heart sounds: Normal heart sounds, S1 normal and S2 normal. No murmur heard.   No friction rub. No gallop.     Comments: EKG- Sinus bradycardia with PAC's No LVH or Q waves Pulmonary:     Effort: Pulmonary effort is normal.     Breath sounds: No stridor. No wheezing, rhonchi or rales.  Abdominal:     General: Abdomen is flat.     Palpations: There is no mass.     Tenderness: There is no abdominal tenderness. There is no guarding.     Hernia: No hernia is present.  Musculoskeletal:     Cervical back: Neck supple.     Right lower leg: 1+ Pitting Edema present.     Left  lower leg: 1+ Pitting Edema present.  Lymphadenopathy:     Cervical: No cervical adenopathy.  Neurological:     Mental Status: He is alert.    Lab Results  Component Value Date   WBC 6.4 12/20/2020   HGB 13.3 12/20/2020   HCT 39.4 12/20/2020   PLT 164.0 12/20/2020   GLUCOSE 129 (H) 12/20/2020   CHOL 109 06/10/2020   TRIG 154.0 (H) 06/10/2020   HDL 39.70 06/10/2020   LDLCALC 38 06/10/2020   ALT 19 06/10/2020  AST 23 06/10/2020   NA 141 12/20/2020   K 3.8 12/20/2020   CL 103 12/20/2020   CREATININE 0.93 12/20/2020   BUN 9 12/20/2020   CO2 32 12/20/2020   TSH 1.45 01/19/2020   PSA 1.36 04/29/2020   HGBA1C 8.6 (H) 12/20/2020   MICROALBUR 1.4 04/29/2020    No results found.  Assessment & Plan:   Dorien was seen today for hypertension and diabetes.  Diagnoses and all orders for this visit:  Diabetes mellitus due to underlying condition with diabetic neuropathy, with long-term current use of insulin ([place])- See below. -     Basic metabolic panel; Future -     Hemoglobin A1c; Future -     Hemoglobin A1c -     Basic metabolic panel  Type 2 diabetes mellitus with diabetic neuropathy, with long-term current use of insulin ([place])- His blood sugar is not adequately well controlled.  Will restart the SGLT2 inhibitor. -     Basic metabolic panel; Future -     Hemoglobin A1c; Future -     HM Diabetes Foot Exam -     Hemoglobin A1c -     Basic metabolic panel -     dapagliflozin propanediol (FARXIGA) 10 MG TABS tablet; Take 1 tablet (10 mg total) by mouth daily before breakfast.  Essential hypertension- His blood pressure is not adequately well controlled and he has lower extremity edema.  His EKG is reassuring.  I recommended that he upgrade to a loop diuretic with better bioavailability. -     CBC with Differential/Platelet; Future -     Basic metabolic panel; Future -     Urinalysis, Routine w reflex microscopic; Future -     EKG 12-Lead -     Urinalysis, Routine w reflex  microscopic -     Basic metabolic panel -     CBC with Differential/Platelet -     torsemide (DEMADEX) 20 MG tablet; Take 1 tablet (20 mg total) by mouth daily.  Other orders -     Flu Vaccine QUAD 6+ mos PF IM (Fluarix Quad PF)  I have discontinued Leane Para Edelstein's Toujeo Max SoloStar, furosemide, and Pfizer-BioNT COVID-19 Vac-TriS. I am also having him start on torsemide and dapagliflozin propanediol. Additionally, I am having him maintain his cetirizine, aspirin EC, albuterol, Cholecalciferol, Lancets, linaclotide, Accu-Chek Guide, triamcinolone cream, Bevespi Aerosphere, B-D SINGLE USE SWABS REGULAR, Droplet Pen Needles, gabapentin, traZODone, Gvoke HypoPen 2-Pack, clotrimazole-betamethasone, Nebivolol HCl, Odefsey, DULoxetine, Glucagon Emergency, HumaLOG KwikPen, pioglitazone, olmesartan, Livalo, Ozempic (0.25 or 0.5 MG/DOSE), Eszopiclone, and metFORMIN.  Meds ordered this encounter  Medications   torsemide (DEMADEX) 20 MG tablet    Sig: Take 1 tablet (20 mg total) by mouth daily.    Dispense:  90 tablet    Refill:  0   dapagliflozin propanediol (FARXIGA) 10 MG TABS tablet    Sig: Take 1 tablet (10 mg total) by mouth daily before breakfast.    Dispense:  90 tablet    Refill:  1      Follow-up: Return in about 3 months (around 03/20/2021).  Scarlette Calico, MD

## 2020-12-20 NOTE — Patient Instructions (Signed)

## 2020-12-24 ENCOUNTER — Other Ambulatory Visit: Payer: Self-pay | Admitting: Internal Medicine

## 2020-12-24 DIAGNOSIS — I10 Essential (primary) hypertension: Secondary | ICD-10-CM

## 2021-01-04 ENCOUNTER — Other Ambulatory Visit: Payer: Self-pay

## 2021-01-04 ENCOUNTER — Other Ambulatory Visit: Payer: 59

## 2021-01-04 DIAGNOSIS — Z113 Encounter for screening for infections with a predominantly sexual mode of transmission: Secondary | ICD-10-CM

## 2021-01-04 DIAGNOSIS — B20 Human immunodeficiency virus [HIV] disease: Secondary | ICD-10-CM

## 2021-01-06 LAB — HIV-1 RNA QUANT-NO REFLEX-BLD
HIV 1 RNA Quant: NOT DETECTED Copies/mL
HIV-1 RNA Quant, Log: NOT DETECTED Log cps/mL

## 2021-01-06 LAB — C. TRACHOMATIS/N. GONORRHOEAE RNA
C. trachomatis RNA, TMA: NOT DETECTED
N. gonorrhoeae RNA, TMA: NOT DETECTED

## 2021-01-06 LAB — RPR: RPR Ser Ql: NONREACTIVE

## 2021-01-06 LAB — T-HELPER CELLS (CD4) COUNT (NOT AT ARMC)
Absolute CD4: 902 {cells}/uL (ref 490–1740)
CD4 T Helper %: 37 % (ref 30–61)
Total lymphocyte count: 2446 {cells}/uL (ref 850–3900)

## 2021-01-13 ENCOUNTER — Other Ambulatory Visit: Payer: Self-pay | Admitting: Endocrinology

## 2021-01-13 DIAGNOSIS — E1140 Type 2 diabetes mellitus with diabetic neuropathy, unspecified: Secondary | ICD-10-CM

## 2021-01-14 ENCOUNTER — Telehealth: Payer: Self-pay | Admitting: Internal Medicine

## 2021-01-14 NOTE — Telephone Encounter (Signed)
Left message for patient to call  back  to schedule Medicare Annual Wellness Visit   Last AWV  10/27/19  Please schedule at anytime with [facility] if patient calls the office back.    40 Minutes appointment   Any questions, please call me at [phone]

## 2021-01-17 ENCOUNTER — Telehealth: Payer: Self-pay

## 2021-01-17 NOTE — Progress Notes (Signed)
Chronic Care Management Pharmacy Assistant   Name: Nicholas Whitehead  MRN: [id number] DOB: May 09, 1955  Reason for Encounter: Disease State- Diabetes Appointment: Telephone 02/28/21 @ 315 pm  Recent office visits:  12/20/20 [name] (PCP) - Diabetes mellitus due to underlying condition with diabetic neuropathy, with long-term current use of insulin. Start Farxiga 10 mg & Torsemide 20 mg. EKG 12-lead. D/c Lasix 20 mg & Insulin Glargine 300 unit.  Recent consult visits:  01/18/21 [name] (Infectious Diseases) - HIV disease. No med changes.  10/29/20 [name] (Podiatry) - Type 2 diabetes mellitus with diabetic neuropathy, with long-term current use of insulin. No med changes.  Hospital visits:  None in previous 6 months  Medications: Outpatient Encounter Medications as of 01/17/2021  Medication Sig   albuterol (PROVENTIL HFA;VENTOLIN HFA) 108 (90 Base) MCG/ACT inhaler Inhale 2 puffs into the lungs every 6 (six) hours as needed for wheezing or shortness of breath.   Alcohol Swabs (B-D SINGLE USE SWABS REGULAR) PADS USE AS DIRECTED   aspirin EC 81 MG tablet Take 81 mg by mouth every morning.   BEVESPI AEROSPHERE 9-4.8 MCG/ACT AERO INHALE 2 PUFFS INTO THE LUNGS TWO TIMES DAILY   cetirizine (ZYRTEC) 10 MG tablet Take 10 mg by mouth daily.   Cholecalciferol 2000 units TABS Take 1 tablet (2,000 Units total) by mouth daily.   clotrimazole-betamethasone (LOTRISONE) cream Apply 1 application topically 2 (two) times daily.   dapagliflozin propanediol (FARXIGA) 10 MG TABS tablet Take 1 tablet (10 mg total) by mouth daily before breakfast.   DULoxetine (CYMBALTA) 60 MG capsule TAKE 1 CAPSULE(60 MG) BY MOUTH DAILY   emtricitabine-rilpivir-tenofovir AF (ODEFSEY) 200-25-25 MG TABS tablet Take 1 tablet by mouth daily.   Eszopiclone 3 MG TABS TAKE 1 TABLET BY MOUTH AT BEDTIME   gabapentin (NEURONTIN) 300 MG capsule TAKE 1 CAPSULE BY MOUTH THREE TIMES DAILY   Glucagon (GVOKE HYPOPEN 2-PACK) 1 MG/0.2ML SOAJ Inject  1 Act into the skin daily as needed.   Glucagon, rDNA, (GLUCAGON EMERGENCY) 1 MG KIT SMARTSIG:1 SUB-Q As Directed   glucose blood (ACCU-CHEK GUIDE) test strip TEST BLOOD SUGAR FOUR TIMES DAILY   HUMALOG KWIKPEN 100 UNIT/ML KwikPen ADMINISTER 25 TO 30 UNITS UNDER THE SKIN THREE TIMES DAILY   Insulin Pen Needle (DROPLET PEN NEEDLES) 31G X 8 MM MISC USE TO INJECT INSULIN   Lancets MISC Use to test blood sugar tid. DX: E11.8   linaclotide (LINZESS) 72 MCG capsule Take 1 capsule (72 mcg total) by mouth daily before breakfast.   LIVALO 1 MG TABS TAKE 1 TABLET(1 MG) BY MOUTH DAILY   metFORMIN (GLUCOPHAGE-XR) 500 MG 24 hr tablet TAKE 2 TABLETS BY MOUTH TWICE DAILY   Nebivolol HCl 20 MG TABS TAKE 1 TABLET(20 MG) BY MOUTH DAILY   olmesartan (BENICAR) 40 MG tablet TAKE 1 TABLET(40 MG) BY MOUTH DAILY   OZEMPIC, 0.25 OR 0.5 MG/DOSE, 2 MG/1.5ML SOPN INJECT 0.25MG UNDER THE SKIN WEEKLY FOR 4 WEEKS, THEN INJECT 0.5MG UNDER THE SKIN ONCE A WEEK   pioglitazone (ACTOS) 30 MG tablet TAKE 1 TABLET(30 MG) BY MOUTH DAILY   torsemide (DEMADEX) 20 MG tablet Take 1 tablet (20 mg total) by mouth daily.   traZODone (DESYREL) 150 MG tablet TAKE 3 TABLETS(450 MG) BY MOUTH AT BEDTIME   triamcinolone cream (KENALOG) 0.5 % APPLY 1 APPLICATION TOPICALLY THREE TIME A DAY   No facility-administered encounter medications on file as of 01/17/2021.   Recent Relevant Labs: Lab Results  Component Value Date/Time   HGBA1C  8.6 (H) 12/20/2020 10:10 AM   HGBA1C 6.7 (H) 06/10/2020 09:24 AM   MICROALBUR 1.4 04/29/2020 02:17 PM   MICROALBUR 1.0 02/19/2019 10:31 AM    Kidney Function Lab Results  Component Value Date/Time   CREATININE 0.93 12/20/2020 10:10 AM   CREATININE 1.32 06/10/2020 09:24 AM   CREATININE 1.03 01/12/2020 11:30 AM   CREATININE 0.99 03/18/2018 09:54 AM   GFR 87.62 12/20/2020 10:10 AM   GFRNONAA 77 01/12/2020 11:30 AM   GFRAA 90 01/12/2020 11:30 AM    Current antihyperglycemic regimen:  Novolog 25-30 units  before meals Metformin ER 500 mg - 2 tab twice a day Pioglitazone 30 mg daily Ozempic 0.25 mg weekly Freestyle Libre 2  Reviewed chart for medication changes and drug therapy problems ahead of medication adherence call.  Attempted to contact patient x 3 for medication review and health check, unable to reach patient, left voicemails to return call.    Care Gaps Colonoscopy - 04/24/2012 Diabetic Foot Exam - 12/20/20 Mammogram - NA Ophthalmology - 07/20/20 Dexa Scan - NA Annual Well Visit - 01/21/2021 Micro albumin - NA Hemoglobin A1c - 12/30/20  Star Rating Drugs: Metformin                    12/15/20              90 Olmesartan                 12/30/20            90 Ozempic                      1/2//23              56 Pitavastatin                 10/27/20            [place], [place] Pharmacists Assistant [phone]

## 2021-01-18 ENCOUNTER — Other Ambulatory Visit: Payer: Self-pay | Admitting: Internal Medicine

## 2021-01-18 ENCOUNTER — Encounter: Payer: Self-pay | Admitting: Internal Medicine

## 2021-01-18 ENCOUNTER — Other Ambulatory Visit: Payer: Self-pay

## 2021-01-18 ENCOUNTER — Ambulatory Visit (INDEPENDENT_AMBULATORY_CARE_PROVIDER_SITE_OTHER): Payer: 59 | Admitting: Internal Medicine

## 2021-01-18 VITALS — BP 104/74 | HR 66 | Temp 97.9°F | Wt 196.0 lb

## 2021-01-18 DIAGNOSIS — F419 Anxiety disorder, unspecified: Secondary | ICD-10-CM | POA: Diagnosis not present

## 2021-01-18 DIAGNOSIS — B20 Human immunodeficiency virus [HIV] disease: Secondary | ICD-10-CM

## 2021-01-18 DIAGNOSIS — F32A Depression, unspecified: Secondary | ICD-10-CM

## 2021-01-18 DIAGNOSIS — E1140 Type 2 diabetes mellitus with diabetic neuropathy, unspecified: Secondary | ICD-10-CM

## 2021-01-18 NOTE — Assessment & Plan Note (Signed)
He continues to do well with [facility] and no concerns.  rtc in 6 months.

## 2021-01-18 NOTE — Progress Notes (Signed)
° °  Subjective:    Patient ID: Nicholas Whitehead, male    DOB: 1955/12/16, 66 y.o.   MRN: [id number]  HPI He is here for follow up of HIV He continues on [facility] and denies any missed doses.  No issues with getting, taking or tolerating the medication.  CD4 of 900 and viral load not detected.  No issues related to HIV.   He does endorse some stressors of his life, continued depression.    Review of Systems  Constitutional:  Negative for fatigue.  Gastrointestinal:  Negative for diarrhea and nausea.  Skin:  Negative for rash.      Objective:   Physical Exam Skin:    Findings: No rash.  Neurological:     General: No focal deficit present.     Mental Status: He is alert.  Psychiatric:        Mood and Affect: Mood normal.        Behavior: Behavior normal.        Thought Content: Thought content normal.          Assessment & Plan:

## 2021-01-18 NOTE — Assessment & Plan Note (Signed)
He continues to have same depressive state and no new concerns.  No SI.   I had a long discussion with him regarding recent stressors and offered my ear and understanding   43 minutes spent with him including 30 minutes face to face in counseling on his depression and stressors

## 2021-01-19 ENCOUNTER — Telehealth: Payer: Self-pay

## 2021-01-19 NOTE — Progress Notes (Signed)
Chronic Care Management Pharmacy Assistant   Name: Nicholas Whitehead  MRN: [id number] DOB: 06-14-1955   Reason for Encounter: Disease State  Appointment: Telephone 02/28/21 @ 315 pm  Conditions to be addressed/monitored: DMII   Recent office visits:  12/20/20 Nicholas Whitehead (PCP) - Diabetes mellitus due to underlying condition with diabetic neuropathy, with long-term current use of insulin. Start Farxiga 10 mg & Torsemide 20 mg. EKG 12-lead. D/c Lasix 20 mg & Insulin Glargine 300 unit.  Recent consult visits:  01/18/21 Nicholas Whitehead (Infectious Diseases) - HIV disease. No med changes.   10/29/20 Nicholas Whitehead (Podiatry) - Type 2 diabetes mellitus with diabetic neuropathy, with long-term current use of insulin. No med changes.  Hospital visits:  None in previous 6 months  Medications: Outpatient Encounter Medications as of 01/19/2021  Medication Sig   albuterol (PROVENTIL HFA;VENTOLIN HFA) 108 (90 Base) MCG/ACT inhaler Inhale 2 puffs into the lungs every 6 (six) hours as needed for wheezing or shortness of breath.   Alcohol Swabs (B-D SINGLE USE SWABS REGULAR) PADS USE AS DIRECTED   aspirin EC 81 MG tablet Take 81 mg by mouth every morning.   BEVESPI AEROSPHERE 9-4.8 MCG/ACT AERO INHALE 2 PUFFS INTO THE LUNGS TWO TIMES DAILY   cetirizine (ZYRTEC) 10 MG tablet Take 10 mg by mouth daily.   Cholecalciferol 2000 units TABS Take 1 tablet (2,000 Units total) by mouth daily.   clotrimazole-betamethasone (LOTRISONE) cream Apply 1 application topically 2 (two) times daily.   dapagliflozin propanediol (FARXIGA) 10 MG TABS tablet Take 1 tablet (10 mg total) by mouth daily before breakfast.   DULoxetine (CYMBALTA) 60 MG capsule TAKE 1 CAPSULE(60 MG) BY MOUTH DAILY   emtricitabine-rilpivir-tenofovir AF (ODEFSEY) 200-25-25 MG TABS tablet Take 1 tablet by mouth daily.   Eszopiclone 3 MG TABS TAKE 1 TABLET BY MOUTH AT BEDTIME   gabapentin (NEURONTIN) 300 MG capsule TAKE 1 CAPSULE BY MOUTH THREE TIMES DAILY   Glucagon  (GVOKE HYPOPEN 2-PACK) 1 MG/0.2ML SOAJ Inject 1 Act into the skin daily as needed.   Glucagon, rDNA, (GLUCAGON EMERGENCY) 1 MG KIT SMARTSIG:1 SUB-Q As Directed   glucose blood (ACCU-CHEK GUIDE) test strip TEST BLOOD SUGAR FOUR TIMES DAILY   HUMALOG KWIKPEN 100 UNIT/ML KwikPen ADMINISTER 25 TO 30 UNITS UNDER THE SKIN THREE TIMES DAILY   Insulin Pen Needle (DROPLET PEN NEEDLES) 31G X 8 MM MISC USE TO INJECT INSULIN   Lancets MISC Use to test blood sugar tid. DX: E11.8   linaclotide (LINZESS) 72 MCG capsule Take 1 capsule (72 mcg total) by mouth daily before breakfast.   LIVALO 1 MG TABS TAKE 1 TABLET(1 MG) BY MOUTH DAILY   metFORMIN (GLUCOPHAGE-XR) 500 MG 24 hr tablet TAKE 2 TABLETS BY MOUTH TWICE DAILY   Nebivolol HCl 20 MG TABS TAKE 1 TABLET(20 MG) BY MOUTH DAILY   olmesartan (BENICAR) 40 MG tablet TAKE 1 TABLET(40 MG) BY MOUTH DAILY   OZEMPIC, 0.25 OR 0.5 MG/DOSE, 2 MG/1.5ML SOPN INJECT 0.25MG UNDER THE SKIN WEEKLY FOR 4 WEEKS, THEN INJECT 0.5MG UNDER THE SKIN ONCE A WEEK   pioglitazone (ACTOS) 30 MG tablet TAKE 1 TABLET(30 MG) BY MOUTH DAILY   torsemide (DEMADEX) 20 MG tablet Take 1 tablet (20 mg total) by mouth daily.   traZODone (DESYREL) 150 MG tablet TAKE 3 TABLETS(450 MG) BY MOUTH AT BEDTIME   triamcinolone cream (KENALOG) 0.5 % APPLY 1 APPLICATION TOPICALLY THREE TIME A DAY   No facility-administered encounter medications on file as of 01/19/2021.   Recent Relevant Labs:  Lab Results  Component Value Date/Time   HGBA1C 8.6 (H) 12/20/2020 10:10 AM   HGBA1C 6.7 (H) 06/10/2020 09:24 AM   MICROALBUR 1.4 04/29/2020 02:17 PM   MICROALBUR 1.0 02/19/2019 10:31 AM    Kidney Function Lab Results  Component Value Date/Time   CREATININE 0.93 12/20/2020 10:10 AM   CREATININE 1.32 06/10/2020 09:24 AM   CREATININE 1.03 01/12/2020 11:30 AM   CREATININE 0.99 03/18/2018 09:54 AM   GFR 87.62 12/20/2020 10:10 AM   GFRNONAA 77 01/12/2020 11:30 AM   GFRAA 90 01/12/2020 11:30 AM    Current  antihyperglycemic regimen:  Novolog 25-30 units before meals Metformin ER 500 mg - 2 tab twice a day Pioglitazone 30 mg daily Ozempic 0.25 mg weekly Freestyle Libre 2 What recent interventions/DTPs have been made to improve glycemic control:  None noted Have there been any recent hospitalizations or ED visits since last visit with CPP? No Patient denies hypoglycemic symptoms, including None Patient denies hyperglycemic symptoms, including none How often are you checking your blood sugar? once daily What are your blood sugars ranging? 109-250 After meals: 290 During the week, how often does your blood glucose drop below 70?  Patient states he has not had any readings below 70. 90 was his lowest reading Are you checking your feet daily/regularly? Patient states that she has some swelling in feet but goes down when elevated  Adherence Review: Is the patient currently on a STATIN medication? No Is the patient currently on ACE/ARB medication? Yes Does the patient have >5 day gap between last estimated fill dates? Yes    Care Gaps: Colonoscopy - 04/24/2012 Diabetic Foot Exam - 12/20/20 Mammogram - NA Ophthalmology - 07/20/20 Dexa Scan - NA Annual Well Visit - 01/21/2021 Micro albumin - NA Hemoglobin A1c - 12/30/20  Star Rating Drugs: Metformin                    12/15/20              90 Olmesartan                 12/30/20            90 Ozempic                      1/2//23              56 Pitavastatin                 10/27/20            [place] Pharmacist Assistant [phone]

## 2021-01-21 ENCOUNTER — Ambulatory Visit (INDEPENDENT_AMBULATORY_CARE_PROVIDER_SITE_OTHER): Payer: 59

## 2021-01-21 ENCOUNTER — Other Ambulatory Visit: Payer: Self-pay

## 2021-01-21 VITALS — Ht 73.0 in | Wt 198.0 lb

## 2021-01-21 DIAGNOSIS — Z0000 Encounter for general adult medical examination without abnormal findings: Secondary | ICD-10-CM

## 2021-01-21 NOTE — Progress Notes (Addendum)
Subjective:   Nicholas Whitehead is a 66 y.o. male who presents for Medicare Annual/Subsequent preventive examination.  Review of Systems     Cardiac Risk Factors include: advanced age (>72mn, >>63women);diabetes mellitus;dyslipidemia;family history of premature cardiovascular disease;hypertension;male gender;smoking/ tobacco exposure     Objective:    Today's Vitals   01/21/21 1532  Weight: 198 lb (89.8 kg)  Height: 6' 1"  (1.854 m)  PainSc: 0-No pain   Body mass index is 26.12 kg/m.  Advanced Directives 01/21/2021 10/27/2019 10/25/2018 03/14/2018 02/25/2018 09/11/2016 08/22/2016  Does Patient Have a Medical Advance Directive? Yes No Yes No No No;Yes Yes  Type of Advance Directive Living will;[place]care [place] - - - [place]  Does patient want to make changes to medical advance directive? No - Patient declined - - - - - -  Copy of [place]? No - copy requested - No - copy requested - - - No - copy requested  Would patient like information on creating a medical advance directive? - No - Patient declined - No - Patient declined No - Patient declined - -  Pre-existing out of facility DNR order (yellow form or pink MOST form) - - - - - - -    Current Medications (verified) Outpatient Encounter Medications as of 01/21/2021  Medication Sig   albuterol (PROVENTIL HFA;VENTOLIN HFA) 108 (90 Base) MCG/ACT inhaler Inhale 2 puffs into the lungs every 6 (six) hours as needed for wheezing or shortness of breath.   Alcohol Swabs (B-D SINGLE USE SWABS REGULAR) PADS USE AS DIRECTED   aspirin EC 81 MG tablet Take 81 mg by mouth every morning.   BEVESPI AEROSPHERE 9-4.8 MCG/ACT AERO INHALE 2 PUFFS INTO THE LUNGS TWO TIMES DAILY   cetirizine (ZYRTEC) 10 MG tablet Take 10 mg by mouth daily.   Cholecalciferol 2000 units TABS Take 1 tablet (2,000 Units total) by mouth daily.    clotrimazole-betamethasone (LOTRISONE) cream Apply 1 application topically 2 (two) times daily.   dapagliflozin propanediol (FARXIGA) 10 MG TABS tablet Take 1 tablet (10 mg total) by mouth daily before breakfast.   DULoxetine (CYMBALTA) 60 MG capsule TAKE 1 CAPSULE(60 MG) BY MOUTH DAILY   emtricitabine-rilpivir-tenofovir AF (ODEFSEY) 200-25-25 MG TABS tablet Take 1 tablet by mouth daily.   Eszopiclone 3 MG TABS TAKE 1 TABLET BY MOUTH AT BEDTIME   gabapentin (NEURONTIN) 300 MG capsule TAKE 1 CAPSULE BY MOUTH THREE TIMES DAILY   Glucagon (GVOKE HYPOPEN 2-PACK) 1 MG/0.2ML SOAJ Inject 1 Act into the skin daily as needed.   Glucagon, rDNA, (GLUCAGON EMERGENCY) 1 MG KIT SMARTSIG:1 SUB-Q As Directed   glucose blood (ACCU-CHEK GUIDE) test strip TEST BLOOD SUGAR FOUR TIMES DAILY   HUMALOG KWIKPEN 100 UNIT/ML KwikPen ADMINISTER 25 TO 30 UNITS UNDER THE SKIN THREE TIMES DAILY   Insulin Pen Needle (DROPLET PEN NEEDLES) 31G X 8 MM MISC USE TO INJECT INSULIN   Lancets MISC Use to test blood sugar tid. DX: E11.8   linaclotide (LINZESS) 72 MCG capsule Take 1 capsule (72 mcg total) by mouth daily before breakfast.   LIVALO 1 MG TABS TAKE 1 TABLET(1 MG) BY MOUTH DAILY   metFORMIN (GLUCOPHAGE-XR) 500 MG 24 hr tablet TAKE 2 TABLETS BY MOUTH TWICE DAILY   Nebivolol HCl 20 MG TABS TAKE 1 TABLET(20 MG) BY MOUTH DAILY   olmesartan (BENICAR) 40 MG tablet TAKE 1 TABLET(40 MG) BY MOUTH DAILY   OZEMPIC,  0.25 OR 0.5 MG/DOSE, 2 MG/1.5ML SOPN INJECT 0.25MG UNDER THE SKIN WEEKLY FOR 4 WEEKS, THEN INJECT 0.5MG UNDER THE SKIN ONCE A WEEK   pioglitazone (ACTOS) 30 MG tablet TAKE 1 TABLET(30 MG) BY MOUTH DAILY   torsemide (DEMADEX) 20 MG tablet Take 1 tablet (20 mg total) by mouth daily.   traZODone (DESYREL) 150 MG tablet TAKE 3 TABLETS(450 MG) BY MOUTH AT BEDTIME   triamcinolone cream (KENALOG) 0.5 % APPLY 1 APPLICATION TOPICALLY THREE TIME A DAY   No facility-administered encounter medications on file as of 01/21/2021.     Allergies (verified) Benazepril, Molds & smuts, Pravastatin, and Glipizide   History: Past Medical History:  Diagnosis Date   Anxiety    Cataract    right    Depression    Diabetes mellitus without complication ([place])    DM (diabetes mellitus screen)    HIV disease ([place]) 09/12/1995   HIV infection ([place])    Hypercholesterolemia    Hyperlipidemia    Hypertension    Past Surgical History:  Procedure Laterality Date   COLONOSCOPY WITH PROPOFOL  01/16/2012   Procedure: COLONOSCOPY WITH PROPOFOL;  Surgeon: Garlan Fair, MD;  Location: [facility];  Service: Endoscopy;  Laterality: N/A;   EYE SURGERY  2005   catarct surgery   optic lens surgery  2005   Family History  Problem Relation Age of Onset   Arthritis Mother    Diabetes Mother    Heart disease Mother    Stroke Mother    Arthritis Father    Diabetes Father    Heart disease Father    Social History   Socioeconomic History   Marital status: Single    Spouse name: Not on file   Number of children: Not on file   Years of education: Not on file   Highest education level: Not on file  Occupational History   Occupation: disability  Tobacco Use   Smoking status: Every Day    Packs/day: 0.25    Types: Cigarettes   Smokeless tobacco: Never  Vaping Use   Vaping Use: Never used  Substance and Sexual Activity   Alcohol use: No    Alcohol/week: 0.0 standard drinks    Comment: socially   Drug use: No   Sexual activity: Never    Birth control/protection: Other-see comments    Comment: declined condoms  Other Topics Concern   Not on file  Social History Narrative   Not on file   Social Determinants of Health   Financial Resource Strain: Low Risk    Difficulty of Paying Living Expenses: Not hard at all  Food Insecurity: No Food Insecurity   Worried About Charity fundraiser in the Last Year: Never true   Ran Out of Food in the Last Year: Never true  Transportation Needs: No Transportation Needs   Lack of  Transportation (Medical): No   Lack of Transportation (Non-Medical): No  Physical Activity: Inactive   Days of Exercise per Week: 0 days   Minutes of Exercise per Session: 0 min  Stress: No Stress Concern Present   Feeling of Stress : Not at all  Social Connections: Moderately Integrated   Frequency of Communication with Friends and Family: More than three times a week   Frequency of Social Gatherings with Friends and Family: Never   Attends Religious Services: More than 4 times per year   Active Member of Clubs or Organizations: No   Attends Music therapist: More than 4 times  per year   Marital Status: Never married    Tobacco Counseling Ready to quit: Not Answered Counseling given: Not Answered   Clinical Intake:  Pre-visit preparation completed: Yes  Pain : No/denies pain Pain Score: 0-No pain     Nutritional Risks: None Diabetes: Yes CBG done?: No Did pt. bring in CBG monitor from home?: No  How often do you need to have someone help you when you read instructions, pamphlets, or other written materials from your doctor or pharmacy?: 1 - Never  Diabetic? yes  Interpreter Needed?: No  Information entered by :: Lisette Abu, LPN   Activities of Daily Living In your present state of health, do you have any difficulty performing the following activities: 01/21/2021  Hearing? N  Vision? N  Difficulty concentrating or making decisions? N  Walking or climbing stairs? N  Dressing or bathing? N  Doing errands, shopping? N  Preparing Food and eating ? N  Using the Toilet? N  In the past six months, have you accidently leaked urine? N  Do you have problems with loss of bowel control? N  Managing your Medications? N  Managing your Finances? N  Housekeeping or managing your Housekeeping? N  Some recent data might be hidden    Patient Care Team: [name], MD as PCP - General (Internal Medicine) [name], Okey Regal, MD as PCP - Infectious  Diseases (Infectious Diseases) Merita Norton, LCSW as Counselor Foltanski, Cleaster Corin, [facility] as Pharmacist (Pharmacist) Calvert Cantor, MD as Consulting Physician (Ophthalmology)  Indicate any recent Medical Services you may have received from other than [facility] providers in the past year (date may be approximate).     Assessment:   This is a routine wellness examination for [place].  Hearing/Vision screen Hearing Screening - Comments:: Patient denied any hearing difficulty.   No hearing aids.  Vision Screening - Comments:: Patient wears corrective glasses/contacts.  Eye exam done annually by: [facility]   Dietary issues and exercise activities discussed: Current Exercise Habits: The patient does not participate in regular exercise at present, Exercise limited by: psychological condition(s)   Goals Addressed   None   Depression Screen PHQ 2/9 Scores 01/21/2021 01/18/2021 12/20/2020 10/27/2019 10/27/2019 07/02/2019 03/20/2019  PHQ - 2 Score 4 4 0 1 1 3 2   PHQ- 9 Score 14 14 - - - - 8  Exception Documentation - - - - - - -    Fall Risk Fall Risk  01/18/2021 07/02/2020 10/27/2019 07/02/2019 10/25/2018  Falls in the past year? 0 0 0 0 0  Comment - - - - -  Number falls in past yr: - 0 0 - 0  Injury with Fall? - 0 0 - 0  Risk Factor Category  - - - - -  Risk for fall due to : - - No Fall Risks No Fall Risks -  Follow up - Falls evaluation completed Falls evaluation completed Falls evaluation completed -    FALL RISK PREVENTION PERTAINING TO THE HOME:  Any stairs in or around the home? No  If so, are there any without handrails? No  Home free of loose throw rugs in walkways, pet beds, electrical cords, etc? Yes  Adequate lighting in your home to reduce risk of falls? Yes   ASSISTIVE DEVICES UTILIZED TO PREVENT FALLS:  Life alert? No  Use of a cane, walker or w/c? No  Grab bars in the bathroom? No  Shower chair or bench in shower? Yes  Elevated toilet seat  or a handicapped  toilet? Yes   TIMED UP AND GO:  Was the test performed? Yes .  Length of time to ambulate 10 feet: 5 sec.   Gait steady and fast without use of assistive device  Cognitive Function: Normal cognitive status assessed by direct observation by this Nurse Health Advisor. No abnormalities found.   MMSE - Mini Mental State Exam 10/25/2018  Not completed: Refused   Montreal Cognitive Assessment  06/28/2015  Visuospatial/ Executive (0/5) 3  Naming (0/3) 3  Attention: Read list of digits (0/2) 2  Attention: Read list of letters (0/1) 1  Attention: Serial 7 subtraction starting at 100 (0/3) 0  Language: Repeat phrase (0/2) 1  Language : Fluency (0/1) 1  Abstraction (0/2) 2  Delayed Recall (0/5) 3  Orientation (0/6) 6  Total 22  Adjusted Score (based on education) 22      Immunizations Immunization History  Administered Date(s) Administered   Hepatitis A 01/29/2002   Influenza Split 09/26/2011   Influenza Whole 10/14/2001, 09/02/2010   Influenza, High Dose Seasonal PF 09/06/2015   Influenza,inj,Quad PF,6+ Mos 09/19/2012, 09/11/2013, 09/22/2014, 03/02/2015, 10/24/2016, 10/23/2017, 09/25/2018, 12/24/2019, 12/20/2020   PFIZER(Purple Top)SARS-COV-2 Vaccination 09/30/2019, 10/21/2019   PPD Test 09/14/1995   Pneumococcal Conjugate-13 01/21/2014   Pneumococcal Polysaccharide-23 12/16/2001, 09/02/2010, 09/06/2015   Tdap 05/21/2013    TDAP status: Up to date  Flu Vaccine status: Up to date  Pneumococcal vaccine status: Up to date  Covid-19 vaccine status: Completed vaccines  Qualifies for Shingles Vaccine? Yes   Zostavax completed No   Shingrix Completed?: No.    Education has been provided regarding the importance of this vaccine. Patient has been advised to call insurance company to determine out of pocket expense if they have not yet received this vaccine. Advised may also receive vaccine at local pharmacy or Health Dept. Verbalized acceptance and understanding.  Screening  Tests Health Maintenance  Topic Date Due   Zoster Vaccines- Shingrix (1 of 2) Never done   COVID-19 Vaccine (3 - Pfizer risk series) 11/18/2019   HEMOGLOBIN A1C  06/20/2021   OPHTHALMOLOGY EXAM  07/20/2021   FOOT EXAM  12/20/2021   COLONOSCOPY (Pts 45-45yr Insurance coverage will need to be confirmed)  04/25/2022   Pneumococcal Vaccine [id number] old (4 - PPSV23 if available, else PCV20) 11/03/2022   TETANUS/TDAP  05/22/2023   INFLUENZA VACCINE  Completed   Hepatitis C Screening  Completed   HIV Screening  Completed   HPV VACCINES  Aged Out    Health Maintenance  Health Maintenance Due  Topic Date Due   Zoster Vaccines- Shingrix (1 of 2) Never done   COVID-19 Vaccine (3 - Pfizer risk series) 11/18/2019    Colorectal cancer screening: Type of screening: Colonoscopy. Completed 04/24/2012. Repeat every 10 years  Lung Cancer Screening: (Low Dose CT Chest recommended if Age 64-80years, 30 pack-year currently smoking OR have quit w/in 15years.) does qualify.   Lung Cancer Screening Referral: no  Additional Screening:  Hepatitis C Screening: does qualify; Completed yes  Vision Screening: Recommended annual ophthalmology exams for early detection of glaucoma and other disorders of the eye. Is the patient up to date with their annual eye exam?  Yes  Who is the provider or what is the name of the office in which the patient attends annual eye exams[facility] provider, would they like to be referred to a provider to establish care? No .   Dental Screening:  Recommended annual dental exams for proper oral hygiene  Community Resource Referral / Chronic Care Management: CRR required this visit?  No   CCM required this visit?  No      Plan:     I have personally reviewed and noted the following in the patients chart:   Medical and social history Use of alcohol, tobacco or illicit drugs  Current medications and supplements  including opioid prescriptions. Patient is not currently taking opioid prescriptions. Functional ability and status Nutritional status Physical activity Advanced directives List of other physicians Hospitalizations, surgeries, and ER visits in previous 12 months Vitals Screenings to include cognitive, depression, and falls Referrals and appointments  In addition, I have reviewed and discussed with patient certain preventive protocols, quality metrics, and best practice recommendations. A written personalized care plan for preventive services as well as general preventive health recommendations were provided to patient.     Sheral Flow, LPN   7/37/5051   Nurse Notes:  Patient refused vital signs. Hearing Screening - Comments:: Patient denied any hearing difficulty.   No hearing aids.  Vision Screening - Comments:: Patient wears corrective glasses/contacts.  Eye exam done annually by: [facility]

## 2021-01-31 ENCOUNTER — Other Ambulatory Visit: Payer: Self-pay | Admitting: Internal Medicine

## 2021-01-31 DIAGNOSIS — F32A Depression, unspecified: Secondary | ICD-10-CM

## 2021-01-31 DIAGNOSIS — F419 Anxiety disorder, unspecified: Secondary | ICD-10-CM

## 2021-02-01 ENCOUNTER — Ambulatory Visit (INDEPENDENT_AMBULATORY_CARE_PROVIDER_SITE_OTHER): Payer: 59 | Admitting: Endocrinology

## 2021-02-01 ENCOUNTER — Other Ambulatory Visit: Payer: Self-pay | Admitting: Internal Medicine

## 2021-02-01 ENCOUNTER — Encounter: Payer: Self-pay | Admitting: Endocrinology

## 2021-02-01 ENCOUNTER — Other Ambulatory Visit: Payer: Self-pay

## 2021-02-01 VITALS — BP 138/60 | HR 68 | Ht 73.0 in | Wt 186.4 lb

## 2021-02-01 DIAGNOSIS — R600 Localized edema: Secondary | ICD-10-CM | POA: Diagnosis not present

## 2021-02-01 DIAGNOSIS — E1165 Type 2 diabetes mellitus with hyperglycemia: Secondary | ICD-10-CM

## 2021-02-01 DIAGNOSIS — Z794 Long term (current) use of insulin: Secondary | ICD-10-CM | POA: Diagnosis not present

## 2021-02-01 DIAGNOSIS — E785 Hyperlipidemia, unspecified: Secondary | ICD-10-CM

## 2021-02-01 DIAGNOSIS — I10 Essential (primary) hypertension: Secondary | ICD-10-CM | POA: Diagnosis not present

## 2021-02-01 LAB — BASIC METABOLIC PANEL
BUN: 25 mg/dL — ABNORMAL HIGH (ref 6–23)
CO2: 33 mEq/L — ABNORMAL HIGH (ref 19–32)
Calcium: 9.9 mg/dL (ref 8.4–10.5)
Chloride: 99 mEq/L (ref 96–112)
Creatinine, Ser: 1.52 mg/dL — ABNORMAL HIGH (ref 0.40–1.50)
GFR: 48.55 mL/min — ABNORMAL LOW (ref >60.00)
Glucose, Bld: 182 mg/dL — ABNORMAL HIGH (ref 70–99)
Potassium: 5 mEq/L (ref 3.5–5.1)
Sodium: 138 mEq/L (ref 135–145)

## 2021-02-01 MED ORDER — TOUJEO SOLOSTAR 300 UNIT/ML ~~LOC~~ SOPN: 80.0000 [IU] | PEN_INJECTOR | Freq: Every day | SUBCUTANEOUS | 3 refills | Status: DC

## 2021-02-01 NOTE — Progress Notes (Signed)
Patient ID: Nicholas Whitehead, male   DOB: [birth year], 66 y.o.   MRN: [id number]           Patient ID: Nicholas Whitehead, male   DOB: Dec 25, 1955, 66 y.o.   MRN: [id number]   Reason for Appointment: Type II Diabetes follow-up   History of Present Illness   Diagnosis date: 1998    Previous history: He was started on oral agents at diagnosis and around 2006 was put on insulin His blood sugars are usually well controlled when he takes Actos along with his insulin. He had been on Invokana since 04/2013  He requires large doses of insulin especially basal insulin   Recent history:   Insulin regimen: Toujeo not taking: Previously 86 units p.m.;  Novolog 30 units before meals         Oral hypoglycemic drugs: Metformin ER 1000 mg twice a day, Actos 30 mg, Ozempic 0.5 weekly, Farxiga 34m  A1c is 8.6 last compared to 6.7   Previous fructosamine 298  Current management, blood sugar patterns and problems: He is coming back after several months for follow-up He was seen by his PCP in December and appears to have been taken off Toujeo and started on Farxiga However his A1c at that time was already 8.6 Patient did not bring his monitor and has not used the freestyle libre for some time He is not able to explain why his blood sugars are higher but he thinks because of his problems with his teeth and dentures he is not able to eat normal food However he thinks his appetite is decreased Because of this he is taking some boost but does not think this is diabetic kind He thinks his blood sugars are mostly high in the afternoon as much as 300 but does not remember readings in the last few days His blood sugar was apparently 180 fasting today when has been as high as 220 No hypoglycemic symptoms He is still taking Ozempic without any nausea Not able to do much walking for exercise     Side effects from medications:  urgency, [place]  2-3 + times a  day  Glucometer:   Accu-Chek  Blood sugar readings previously:   PRE-MEAL Fasting Lunch Dinner Bedtime Overall  Glucose range:     84-265  Mean/median:  150  151  137  156 152   POST-MEAL PC Breakfast PC Lunch PC Dinner  Glucose range:     Mean/median:   152            Meals: Breakfast variable, 9-10 AM Lunch 2-3pm  usually avoiding high-fat meals, dinner at about 6 pm          Dietician visit: Most recent: 2013    Weight control:  Wt Readings from Last 3 Encounters:  02/01/21 186 lb 6.4 oz (84.6 kg)  01/21/21 198 lb (89.8 kg)  01/18/21 196 lb (88.9 kg)          Diabetes labs:  Lab Results  Component Value Date   HGBA1C 8.6 (H) 12/20/2020   HGBA1C 6.7 (H) 06/10/2020   HGBA1C 6.7 (H) 02/20/2020   Lab Results  Component Value Date   MICROALBUR 1.4 04/29/2020   LDLCALC 38 06/10/2020   CREATININE 1.52 (H) 02/01/2021   Lab Results  Component Value Date   FRUCTOSAMINE 280 09/11/2019   FRUCTOSAMINE 298 (H) 04/28/2019   FRUCTOSAMINE 276 09/13/2018   FRUCTOSAMINE 295 (H) 07/09/2018  FRUCTOSAMINE 309 (H) 02/13/2018    Other active problems discussed today: See review of systems   Office Visit on 02/01/2021  Component Date Value Ref Range Status   Sodium 02/01/2021 138  135 - 145 mEq/L Final   Potassium 02/01/2021 5.0  3.5 - 5.1 mEq/L Final   Chloride 02/01/2021 99  96 - 112 mEq/L Final   CO2 02/01/2021 33 (H)  19 - 32 mEq/L Final   Glucose, Bld 02/01/2021 182 (H)  70 - 99 mg/dL Final   BUN 02/01/2021 25 (H)  6 - 23 mg/dL Final   Creatinine, Ser 02/01/2021 1.52 (H)  0.40 - 1.50 mg/dL Final   GFR 02/01/2021 48.55 (L)  >60.00 mL/min Final   Calculated using the CKD-EPI Creatinine Equation (2021)   Calcium 02/01/2021 9.9  8.4 - 10.5 mg/dL Final     Allergies as of 02/01/2021       Reactions   Benazepril Cough   Molds & Smuts    Hard to breathe   Pravastatin Other (See Comments)   myositis   Glipizide Nausea Only        Medication List         Accurate as of February 01, 2021  4:14 PM. If you have any questions, ask your nurse or doctor.          Accu-Chek Guide test strip Generic drug: glucose blood TEST BLOOD SUGAR FOUR TIMES DAILY   albuterol 108 (90 Base) MCG/ACT inhaler Commonly known as: VENTOLIN HFA Inhale 2 puffs into the lungs every 6 (six) hours as needed for wheezing or shortness of breath.   aspirin EC 81 MG tablet Take 81 mg by mouth every morning.   B-D SINGLE USE SWABS REGULAR Pads USE AS DIRECTED   Bevespi Aerosphere 9-4.8 MCG/ACT Aero Generic drug: Glycopyrrolate-Formoterol INHALE 2 PUFFS INTO THE LUNGS TWO TIMES DAILY   cetirizine 10 MG tablet Commonly known as: ZYRTEC Take 10 mg by mouth daily.   Cholecalciferol 50 MCG (2000 UT) Tabs Take 1 tablet (2,000 Units total) by mouth daily.   clotrimazole-betamethasone cream Commonly known as: Lotrisone Apply 1 application topically 2 (two) times daily.   dapagliflozin propanediol 10 MG Tabs tablet Commonly known as: Farxiga Take 1 tablet (10 mg total) by mouth daily before breakfast.   Droplet Pen Needles 31G X 8 MM Misc Generic drug: Insulin Pen Needle USE TO INJECT INSULIN   DULoxetine 60 MG capsule Commonly known as: CYMBALTA TAKE 1 CAPSULE(60 MG) BY MOUTH DAILY   Eszopiclone 3 MG Tabs TAKE 1 TABLET BY MOUTH AT BEDTIME   gabapentin 300 MG capsule Commonly known as: NEURONTIN TAKE 1 CAPSULE BY MOUTH THREE TIMES DAILY   Glucagon Emergency 1 MG Kit SMARTSIG:1 SUB-Q As Directed   Gvoke HypoPen 2-Pack 1 MG/0.2ML Soaj Generic drug: Glucagon Inject 1 Act into the skin daily as needed.   HumaLOG KwikPen 100 UNIT/ML KwikPen Generic drug: insulin lispro ADMINISTER 25 TO 30 UNITS UNDER THE SKIN THREE TIMES DAILY   Lancets Misc Use to test blood sugar tid. DX: E11.8   linaclotide 72 MCG capsule Commonly known as: Linzess Take 1 capsule (72 mcg total) by mouth daily before breakfast.   Livalo 1 MG Tabs Generic drug: Pitavastatin  Calcium TAKE 1 TABLET(1 MG) BY MOUTH DAILY   metFORMIN 500 MG 24 hr tablet Commonly known as: GLUCOPHAGE-XR TAKE 2 TABLETS BY MOUTH TWICE DAILY   Nebivolol HCl 20 MG Tabs TAKE 1 TABLET(20 MG) BY MOUTH DAILY   Odefsey 200-25-25  MG Tabs tablet Generic drug: emtricitabine-rilpivir-tenofovir AF Take 1 tablet by mouth daily.   olmesartan 40 MG tablet Commonly known as: BENICAR TAKE 1 TABLET(40 MG) BY MOUTH DAILY   Ozempic (0.25 or 0.5 MG/DOSE) 2 MG/1.5ML Sopn Generic drug: Semaglutide(0.25 or 0.5MG/DOS) INJECT 0.25MG UNDER THE SKIN WEEKLY FOR 4 WEEKS, THEN INJECT 0.5MG UNDER THE SKIN ONCE A WEEK   pioglitazone 30 MG tablet Commonly known as: ACTOS TAKE 1 TABLET(30 MG) BY MOUTH DAILY   torsemide 20 MG tablet Commonly known as: Demadex Take 1 tablet (20 mg total) by mouth daily.   Toujeo SoloStar 300 UNIT/ML Solostar Pen Generic drug: insulin glargine (1 Unit Dial) Inject 80 Units into the skin daily. Started by: Elayne Snare, MD   traZODone 150 MG tablet Commonly known as: DESYREL TAKE 3 TABLETS(450 MG) BY MOUTH AT BEDTIME   triamcinolone cream 0.5 % Commonly known as: KENALOG APPLY 1 APPLICATION TOPICALLY THREE TIME A DAY        Allergies:  Allergies  Allergen Reactions   Benazepril Cough   Molds & Smuts     Hard to breathe   Pravastatin Other (See Comments)    myositis   Glipizide Nausea Only    Past Medical History:  Diagnosis Date   Anxiety    Cataract    right    Depression    Diabetes mellitus without complication ([place])    DM (diabetes mellitus screen)    HIV disease ([place]) 09/12/1995   HIV infection ([place])    Hypercholesterolemia    Hyperlipidemia    Hypertension     Past Surgical History:  Procedure Laterality Date   COLONOSCOPY WITH PROPOFOL  01/16/2012   Procedure: COLONOSCOPY WITH PROPOFOL;  Surgeon: Garlan Fair, MD;  Location: [facility];  Service: Endoscopy;  Laterality: N/A;   EYE SURGERY  2005   catarct surgery   optic lens  surgery  2005    Family History  Problem Relation Age of Onset   Arthritis Mother    Diabetes Mother    Heart disease Mother    Stroke Mother    Arthritis Father    Diabetes Father    Heart disease Father     Social History:  reports that he has been smoking cigarettes. He has been smoking an average of .25 packs per day. He has never used smokeless tobacco. He reports that he does not drink alcohol and does not use drugs.  Review of Systems:  Hypertension:  on Bystolic 20 mg and on Benicar 40 mg, prescribed by his PCP Also now on torsemide 20 mg    BP Readings from Last 3 Encounters:  02/01/21 138/60  01/18/21 104/74  12/20/20 (!) 160/78   RENAL function: consistently normal   Lab Results  Component Value Date   CREATININE 1.52 (H) 02/01/2021   CREATININE 0.93 12/20/2020   CREATININE 1.32 06/10/2020    Lipids: LDL is at target as follows Treatment prescribed by PCP, he was on Livalo 1 mg   Lab Results  Component Value Date   CHOL 109 06/10/2020   HDL 39.70 06/10/2020   LDLCALC 38 06/10/2020   TRIG 154.0 (H) 06/10/2020   CHOLHDL 3 06/10/2020    Neuropathy: Has had paresthesiae in feet and legs, these are treated with gabapentin 300 mg 3 times a day   Forms for the diabetic shoes were sent in July 21 to the podiatrist. Has sensory loss on exam He had surgery for foot ulcer on the right side  Examination:   BP 138/60    Pulse 68    Ht _0  (1.854 m)    Wt 186 lb 6.4 oz (84.6 kg)    SpO2 94%    BMI 24.59 kg/m   Body mass index is 24.59 kg/m.   No lower leg edema present  ASSESSMENT/ PLAN:    Diabetes type 2 on insulin:   See history of present illness for detailed discussion of his current management, blood sugar patterns and problems identified  His A1c is 8.6   He is on basal bolus insulin, Metformin, Farxiga, Ozempic 0.5 mg weekly and Actos 30 mg  He has not been seen in follow-up for about 7 months His blood sugars are generally  higher over the last few weeks and last A1c was higher but not clear why He is not taking Toujeo and only mealtime insulin and not clear why his 86 units of Toujeo was stopped by his PCP abruptly He has not brought his monitor for download Not clear if he is benefiting from adding [place]  He has used the [employer] before but not clear why he does not use this consistently Overall appears to be less motivated for his control likely from his depression His food intake is apparently variable   Recommendations:  Restart Toujeo at least at 30 units daily Discussed titration based on fasting blood sugar every 3 days and go up 5 units until morning sugars are below 120 Restart freestyle libre He needs to follow-up more regularly Check renal function with starting [place] To reduce polypharmacy we will stop his metformin for now  Continue 0.5 mg Ozempic Only uses diabetic boost and not regular boost He will watch portions of carbohydrates and try to have more protein consistently at meals  Hypertension: Appears better controlled now and also edema   Patient Instructions  Use diabetic Boost  Check blood sugars on waking up 6-7 days a week  Also check blood sugars about 2 hours after meals and do this after different meals by rotation  Recommended blood sugar levels on waking up are 90-130 and about 2 hours after meal is 130-160  Please bring your blood sugar monitor to each visit, thank you  Stop Metformin when out  Toujeo 30 units daily  Go up 5 units till am sugar is < 120       Lenetta Piche 02/01/2021, 4:14 PM           ..

## 2021-02-01 NOTE — Patient Instructions (Addendum)
Use diabetic Boost  Check blood sugars on waking up 6-7 days a week  Also check blood sugars about 2 hours after meals and do this after different meals by rotation  Recommended blood sugar levels on waking up are 90-130 and about 2 hours after meal is 130-160  Please bring your blood sugar monitor to each visit, thank you  Stop Metformin when out  Toujeo 30 units daily  Go up 5 units till am sugar is < 120

## 2021-02-02 LAB — FRUCTOSAMINE: Fructosamine: 367 umol/L — ABNORMAL HIGH (ref 0–285)

## 2021-02-09 ENCOUNTER — Telehealth: Payer: Self-pay

## 2021-02-09 NOTE — Progress Notes (Signed)
Chronic Care Management Pharmacy Assistant   Name: Nicholas Whitehead  MRN: [id number] DOB: 1955-10-27   Reason for Encounter: Disease State   Conditions to be addressed/monitored: DMII   Recent office visits:  None ID  Recent consult visits:  02/01/21 Elayne Snare, MD-Endocrinology (Type 2 diabetes mellitus) Med changes: Stop Metformin when out, Toujeo 30 units daily  Hospital visits:  None since last coordination call  Medications: Outpatient Encounter Medications as of 02/09/2021  Medication Sig   albuterol (PROVENTIL HFA;VENTOLIN HFA) 108 (90 Base) MCG/ACT inhaler Inhale 2 puffs into the lungs every 6 (six) hours as needed for wheezing or shortness of breath. (Patient not taking: Reported on 02/01/2021)   Alcohol Swabs (B-D SINGLE USE SWABS REGULAR) PADS USE AS DIRECTED   aspirin EC 81 MG tablet Take 81 mg by mouth every morning.   BEVESPI AEROSPHERE 9-4.8 MCG/ACT AERO INHALE 2 PUFFS INTO THE LUNGS TWO TIMES DAILY (Patient not taking: Reported on 02/01/2021)   cetirizine (ZYRTEC) 10 MG tablet Take 10 mg by mouth daily.   Cholecalciferol 2000 units TABS Take 1 tablet (2,000 Units total) by mouth daily.   clotrimazole-betamethasone (LOTRISONE) cream Apply 1 application topically 2 (two) times daily.   dapagliflozin propanediol (FARXIGA) 10 MG TABS tablet Take 1 tablet (10 mg total) by mouth daily before breakfast.   DULoxetine (CYMBALTA) 60 MG capsule TAKE 1 CAPSULE(60 MG) BY MOUTH DAILY   emtricitabine-rilpivir-tenofovir AF (ODEFSEY) 200-25-25 MG TABS tablet Take 1 tablet by mouth daily.   Eszopiclone 3 MG TABS TAKE 1 TABLET BY MOUTH AT BEDTIME   gabapentin (NEURONTIN) 300 MG capsule TAKE 1 CAPSULE BY MOUTH THREE TIMES DAILY   Glucagon (GVOKE HYPOPEN 2-PACK) 1 MG/0.2ML SOAJ Inject 1 Act into the skin daily as needed.   Glucagon, rDNA, (GLUCAGON EMERGENCY) 1 MG KIT SMARTSIG:1 SUB-Q As Directed (Patient not taking: Reported on 02/01/2021)   glucose blood (ACCU-CHEK GUIDE) test strip  TEST BLOOD SUGAR FOUR TIMES DAILY   HUMALOG KWIKPEN 100 UNIT/ML KwikPen ADMINISTER 25 TO 30 UNITS UNDER THE SKIN THREE TIMES DAILY   insulin glargine, 1 Unit Dial, (TOUJEO SOLOSTAR) 300 UNIT/ML Solostar Pen Inject 80 Units into the skin daily.   Insulin Pen Needle (DROPLET PEN NEEDLES) 31G X 8 MM MISC USE TO INJECT INSULIN   Lancets MISC Use to test blood sugar tid. DX: E11.8   linaclotide (LINZESS) 72 MCG capsule Take 1 capsule (72 mcg total) by mouth daily before breakfast.   LIVALO 1 MG TABS TAKE 1 TABLET(1 MG) BY MOUTH DAILY   Nebivolol HCl 20 MG TABS TAKE 1 TABLET(20 MG) BY MOUTH DAILY   olmesartan (BENICAR) 40 MG tablet TAKE 1 TABLET(40 MG) BY MOUTH DAILY   OZEMPIC, 0.25 OR 0.5 MG/DOSE, 2 MG/1.5ML SOPN INJECT 0.25MG UNDER THE SKIN WEEKLY FOR 4 WEEKS, THEN INJECT 0.5MG UNDER THE SKIN ONCE A WEEK   pioglitazone (ACTOS) 30 MG tablet TAKE 1 TABLET(30 MG) BY MOUTH DAILY   torsemide (DEMADEX) 20 MG tablet Take 1 tablet (20 mg total) by mouth daily.   traZODone (DESYREL) 150 MG tablet TAKE 3 TABLETS(450 MG) BY MOUTH AT BEDTIME   triamcinolone cream (KENALOG) 0.5 % APPLY 1 APPLICATION TOPICALLY THREE TIME A DAY   No facility-administered encounter medications on file as of 02/09/2021.   Recent Relevant Labs: Lab Results  Component Value Date/Time   HGBA1C 8.6 (H) 12/20/2020 10:10 AM   HGBA1C 6.7 (H) 06/10/2020 09:24 AM   MICROALBUR 1.4 04/29/2020 02:17 PM   MICROALBUR 1.0 02/19/2019  10:31 AM    Kidney Function Lab Results  Component Value Date/Time   CREATININE 1.52 (H) 02/01/2021 10:05 AM   CREATININE 0.93 12/20/2020 10:10 AM   CREATININE 1.03 01/12/2020 11:30 AM   CREATININE 0.99 03/18/2018 09:54 AM   GFR 48.55 (L) 02/01/2021 10:05 AM   GFRNONAA 77 01/12/2020 11:30 AM   GFRAA 90 01/12/2020 11:30 AM    Current antihyperglycemic regimen:  Novolog 25-30 units before meals Metformin ER 500 mg - 2 tab twice a day Pioglitazone 30 mg daily (Patient states he is out for about 2 months  and need refill) Ozempic 0.25 mg weekly Freestyle Libre 2  What recent interventions/DTPs have been made to improve glycemic control:  Restart Toujeo at least at 30 units daily,Go up 5 units till am sugar is < 120, Stop Metformin when out, per Dr. Dwyane Dee  Have there been any recent hospitalizations or ED visits since last visit with CPP? No  Patient denies hypoglycemic symptoms, including None, patient states that he is not having any symptoms  Patient denies hyperglycemic symptoms, including none  How often are you checking your blood sugar? once daily  What are your blood sugars ranging? 114-257 Fasting: 214  During the week, how often does your blood glucose drop below 70?  Patient states no reading below 70  Are you checking your feet daily/regularly?Patient states legs and feet still swelling   Patient states he also needs refill on Livalo, completely out  Adherence Review: Is the patient currently on a STATIN medication? No Is the patient currently on ACE/ARB medication? Yes Does the patient have >5 day gap between last estimated fill dates? No  Phone appt with Linna Hoff 04/28/21 @ 9am  Care Gaps: Colonoscopy - 04/24/2012 Diabetic Foot Exam - 12/20/20 Mammogram - NA Ophthalmology - 07/20/20 Dexa Scan - NA Annual Well Visit - 01/21/2021 Micro albumin - NA Hemoglobin A1c - 12/30/20  Star Rating Drugs: Metformin                    12/15/20              90 Olmesartan                 12/30/20            90 Ozempic                      1/2//23              56 Pitavastatin                 10/27/20            [facility] Pharmacist Assistant [phone]

## 2021-02-11 ENCOUNTER — Other Ambulatory Visit: Payer: Self-pay | Admitting: Endocrinology

## 2021-02-11 DIAGNOSIS — E1140 Type 2 diabetes mellitus with diabetic neuropathy, unspecified: Secondary | ICD-10-CM

## 2021-02-11 DIAGNOSIS — Z794 Long term (current) use of insulin: Secondary | ICD-10-CM

## 2021-02-28 ENCOUNTER — Telehealth: Payer: 59

## 2021-03-01 ENCOUNTER — Ambulatory Visit (INDEPENDENT_AMBULATORY_CARE_PROVIDER_SITE_OTHER): Payer: 59 | Admitting: Podiatry

## 2021-03-01 ENCOUNTER — Encounter: Payer: Self-pay | Admitting: Podiatry

## 2021-03-01 ENCOUNTER — Other Ambulatory Visit: Payer: Self-pay

## 2021-03-01 DIAGNOSIS — M79674 Pain in right toe(s): Secondary | ICD-10-CM

## 2021-03-01 DIAGNOSIS — L84 Corns and callosities: Secondary | ICD-10-CM

## 2021-03-01 DIAGNOSIS — M79675 Pain in left toe(s): Secondary | ICD-10-CM | POA: Diagnosis not present

## 2021-03-01 DIAGNOSIS — B351 Tinea unguium: Secondary | ICD-10-CM | POA: Diagnosis not present

## 2021-03-01 DIAGNOSIS — Z794 Long term (current) use of insulin: Secondary | ICD-10-CM | POA: Diagnosis not present

## 2021-03-01 DIAGNOSIS — E1140 Type 2 diabetes mellitus with diabetic neuropathy, unspecified: Secondary | ICD-10-CM

## 2021-03-07 NOTE — Progress Notes (Signed)
°  Subjective:  Patient ID: Nicholas Whitehead, male    DOB: 10/19/1955,  MRN: [id number]  Nicholas Whitehead presents to clinic today for at risk foot care with history of diabetic neuropathy and callus(es) bilateral heels and painful thick toenails that are difficult to trim. Painful toenails interfere with ambulation. Aggravating factors include wearing enclosed shoe gear. Pain is relieved with periodic professional debridement. Painful calluses are aggravated when weightbearing with and without shoegear. Pain is relieved with periodic professional debridement.  New problem(s): None.   He has known malunion of right great toe arthrodesis.He relates occasional discomfort at joint. No open wound. No edema or redness.  PCP is [name], MD , and last visit was December 20, 2020.  Allergies  Allergen Reactions   Benazepril Cough   Molds & Smuts     Hard to breathe   Pravastatin Other (See Comments)    myositis   Glipizide Nausea Only    Review of Systems: Negative except as noted in the HPI. Objective:   Constitutional Nicholas Whitehead is a pleasant 65 y.o. African American male, in NAD. AAO x 3.   Vascular CFT immediate b/l LE. Palpable DP/PT pulses b/l LE. Digital hair sparse b/l. Skin temperature gradient WNL b/l. No pain with calf compression b/l. No edema noted b/l. No cyanosis or clubbing noted b/l LE.  Neurologic Normal speech. Oriented to person, place, and time. Protective sensation diminished with 10g monofilament b/l. Vibratory sensation intact b/l.  Dermatologic Pedal integument with normal turgor, texture and tone b/l LE. No open wounds b/l. No interdigital macerations b/l. Toenails 1-5 b/l elongated, thickened, discolored with subungual debris. +Tenderness with dorsal palpation of nailplates. Hyperkeratotic lesion(s) noted plantar heel pad of both feet.  Orthopedic: Muscle strength 5/5 to all lower extremity muscle groups bilaterally. Hammertoe(s) noted to the 2-5 bilaterally and L  hallux.   Radiographs: None  Last A1c:  Hemoglobin A1C Latest Ref Rng & Units 12/20/2020 06/10/2020  HGBA1C 4.6 - 6.5 % 8.6(H) 6.7(H)  Some recent data might be hidden   Assessment:   1. Pain due to onychomycosis of toenails of both feet   2. Callus   3. Type 2 diabetes mellitus with diabetic neuropathy, with long-term current use of insulin ([place])    Plan:  Patient was evaluated and treated and all questions answered. Consent given for treatment as described below: -Continue foot and shoe inspections daily. Monitor blood glucose per PCP/Endocrinologist's recommendations. -Mycotic toenails 1-5 bilaterally were debrided in length and girth with sterile nail nippers and dremel without incident. -Callus(es) plantar heel pad of both feet pared utilizing sterile scalpel blade without complication or incident. Total number debrided =2. -Patient/POA to call should there be question/concern in the interim.  Return in about 3 months (around 05/29/2021).  Marzetta Board, DPM

## 2021-03-09 ENCOUNTER — Other Ambulatory Visit: Payer: Self-pay | Admitting: Internal Medicine

## 2021-03-09 DIAGNOSIS — F5105 Insomnia due to other mental disorder: Secondary | ICD-10-CM

## 2021-03-09 DIAGNOSIS — F409 Phobic anxiety disorder, unspecified: Secondary | ICD-10-CM

## 2021-03-09 DIAGNOSIS — E1140 Type 2 diabetes mellitus with diabetic neuropathy, unspecified: Secondary | ICD-10-CM

## 2021-03-10 ENCOUNTER — Telehealth: Payer: Self-pay

## 2021-03-10 ENCOUNTER — Other Ambulatory Visit (INDEPENDENT_AMBULATORY_CARE_PROVIDER_SITE_OTHER): Payer: 59

## 2021-03-10 ENCOUNTER — Other Ambulatory Visit: Payer: Self-pay

## 2021-03-10 DIAGNOSIS — E785 Hyperlipidemia, unspecified: Secondary | ICD-10-CM | POA: Diagnosis not present

## 2021-03-10 DIAGNOSIS — E1165 Type 2 diabetes mellitus with hyperglycemia: Secondary | ICD-10-CM | POA: Diagnosis not present

## 2021-03-10 DIAGNOSIS — Z794 Long term (current) use of insulin: Secondary | ICD-10-CM

## 2021-03-10 LAB — LIPID PANEL
Cholesterol: 128 mg/dL (ref 0–200)
HDL: 45.7 mg/dL (ref >39.00)
LDL Cholesterol: 58 mg/dL (ref 0–99)
NonHDL: 82.06
Total CHOL/HDL Ratio: 3
Triglycerides: 122 mg/dL (ref 0.0–149.0)
VLDL: 24.4 mg/dL (ref 0.0–40.0)

## 2021-03-10 LAB — BASIC METABOLIC PANEL
BUN: 14 mg/dL (ref 6–23)
CO2: 29 mEq/L (ref 19–32)
Calcium: 9.4 mg/dL (ref 8.4–10.5)
Chloride: 104 mEq/L (ref 96–112)
Creatinine, Ser: 1.2 mg/dL (ref 0.40–1.50)
GFR: 64.43 mL/min (ref >60.00)
Glucose, Bld: 151 mg/dL — ABNORMAL HIGH (ref 70–99)
Potassium: 4.4 mEq/L (ref 3.5–5.1)
Sodium: 138 mEq/L (ref 135–145)

## 2021-03-10 NOTE — Progress Notes (Signed)
? ? ?Chronic Care Management ?Pharmacy Assistant  ? ?Name: Nicholas Whitehead  MRN: [id number] DOB: October 12, 1955 ? ?Nicholas Whitehead is an 66 y.o. year old male who presents for his follow-up CCM visit with the clinical pharmacist. ? ?Reason for Encounter: Disease State ?  ?Conditions to be addressed/monitored: ?DMII ? ? ?Recent office visits:  ?None ID ? ?Recent consult visits:  ?03/01/21 Marzetta Board, DPM-Podiatry (Nail problem) No orders or med changes ? ?Hospital visits:  ?None since last coordination call ? ?Medications: ?Outpatient Encounter Medications as of 03/10/2021  ?Medication Sig  ? albuterol (PROVENTIL HFA;VENTOLIN HFA) 108 (90 Base) MCG/ACT inhaler Inhale 2 puffs into the lungs every 6 (six) hours as needed for wheezing or shortness of breath. (Patient not taking: Reported on 02/01/2021)  ? Alcohol Swabs (B-D SINGLE USE SWABS REGULAR) PADS USE AS DIRECTED  ? aspirin EC 81 MG tablet Take 81 mg by mouth every morning.  ? BEVESPI AEROSPHERE 9-4.8 MCG/ACT AERO INHALE 2 PUFFS INTO THE LUNGS TWO TIMES DAILY (Patient not taking: Reported on 02/01/2021)  ? cetirizine (ZYRTEC) 10 MG tablet Take 10 mg by mouth daily.  ? Cholecalciferol 2000 units TABS Take 1 tablet (2,000 Units total) by mouth daily.  ? clotrimazole-betamethasone (LOTRISONE) cream Apply 1 application topically 2 (two) times daily.  ? dapagliflozin propanediol (FARXIGA) 10 MG TABS tablet Take 1 tablet (10 mg total) by mouth daily before breakfast.  ? DULoxetine (CYMBALTA) 60 MG capsule TAKE 1 CAPSULE(60 MG) BY MOUTH DAILY  ? emtricitabine-rilpivir-tenofovir AF (ODEFSEY) 200-25-25 MG TABS tablet Take 1 tablet by mouth daily.  ? Eszopiclone 3 MG TABS TAKE 1 TABLET BY MOUTH AT BEDTIME  ? gabapentin (NEURONTIN) 300 MG capsule TAKE 1 CAPSULE BY MOUTH THREE TIMES DAILY  ? Glucagon (GVOKE HYPOPEN 2-PACK) 1 MG/0.2ML SOAJ Inject 1 Act into the skin daily as needed.  ? Glucagon, rDNA, (GLUCAGON EMERGENCY) 1 MG KIT SMARTSIG:1 SUB-Q As Directed (Patient not taking:  Reported on 02/01/2021)  ? glucose blood (ACCU-CHEK GUIDE) test strip TEST BLOOD SUGAR FOUR TIMES DAILY  ? HUMALOG KWIKPEN 100 UNIT/ML KwikPen ADMINISTER 25 TO 30 UNITS UNDER THE SKIN THREE TIMES DAILY  ? insulin glargine, 1 Unit Dial, (TOUJEO SOLOSTAR) 300 UNIT/ML Solostar Pen Inject 80 Units into the skin daily.  ? Insulin Pen Needle (B-D ULTRAFINE III SHORT PEN) 31G X 8 MM MISC USE TO INJECT INSULIN AS DIRECTED  ? Lancets MISC Use to test blood sugar tid. DX: E11.8  ? linaclotide (LINZESS) 72 MCG capsule Take 1 capsule (72 mcg total) by mouth daily before breakfast.  ? LIVALO 1 MG TABS TAKE 1 TABLET(1 MG) BY MOUTH DAILY  ? Nebivolol HCl 20 MG TABS TAKE 1 TABLET(20 MG) BY MOUTH DAILY  ? olmesartan (BENICAR) 40 MG tablet TAKE 1 TABLET(40 MG) BY MOUTH DAILY  ? OZEMPIC, 0.25 OR 0.5 MG/DOSE, 2 MG/1.5ML SOPN INJECT 0.25MG UNDER THE SKIN WEEKLY FOR 4 WEEKS, THEN INJECT 0.5MG UNDER THE SKIN ONCE A WEEK  ? pioglitazone (ACTOS) 30 MG tablet TAKE 1 TABLET(30 MG) BY MOUTH DAILY  ? torsemide (DEMADEX) 20 MG tablet Take 1 tablet (20 mg total) by mouth daily.  ? traZODone (DESYREL) 150 MG tablet TAKE 3 TABLETS(450 MG) BY MOUTH AT BEDTIME  ? triamcinolone cream (KENALOG) 0.5 % APPLY 1 APPLICATION TOPICALLY THREE TIME A DAY  ? ?No facility-administered encounter medications on file as of 03/10/2021.  ? ?Recent Relevant Labs: ?Lab Results  ?Component Value Date/Time  ? HGBA1C 8.6 (H) 12/20/2020 10:10 AM  ?  HGBA1C 6.7 (H) 06/10/2020 09:24 AM  ? MICROALBUR 1.4 04/29/2020 02:17 PM  ? MICROALBUR 1.0 02/19/2019 10:31 AM  ?  ?Kidney Function ?Lab Results  ?Component Value Date/Time  ? CREATININE 1.52 (H) 02/01/2021 10:05 AM  ? CREATININE 0.93 12/20/2020 10:10 AM  ? CREATININE 1.03 01/12/2020 11:30 AM  ? CREATININE 0.99 03/18/2018 09:54 AM  ? GFR 48.55 (L) 02/01/2021 10:05 AM  ? GFRNONAA 77 01/12/2020 11:30 AM  ? GFRAA 90 01/12/2020 11:30 AM  ? ?Reviewed chart for medication changes and drug therapy problems ahead of medication adherence  call. ? ?Attempted to contact patient x 3 for medication review and health check, unable to reach patient, left voicemails to return call. ?  ?Current antihyperglycemic regimen:  ?Novolog 25-30 units before meals ?Metformin ER 500 mg - 2 tab twice a day ?Pioglitazone 30 mg daily (Patient states he is out for about 2 months and need refill) ?Ozempic 0.25 mg weekly ?Freestyle Libre 2 ? ?What recent interventions/DTPs have been made to improve glycemic control:  ?None ID ? ?Have there been any recent hospitalizations or ED visits since last visit with CPP? No ? ? ?Adherence Review: ?Is the patient currently on a STATIN medication? No ?Is the patient currently on ACE/ARB medication? Yes ?Does the patient have >5 day gap between last estimated fill dates? Yes ? ?Care Gaps: ?Colonoscopy - 04/24/2012 ?Diabetic Foot Exam - 12/20/20 ?Mammogram - NA ?Ophthalmology - 07/20/20 ?Dexa Scan - NA ?Annual Well Visit - 01/21/2021 ?Micro albumin - NA ?Hemoglobin A1c - 12/30/20 ? ?Star Rating Drugs: ?Metformin                    12/15/20              90 ?Olmesartan                 12/30/20            90 ?Ozempic                      1/2//23              56 ?Pitavastatin                 02/11/21           90 ?  ? ?Ethelene Hal ?Clinical Pharmacist Assistant ?[phone]  ?

## 2021-03-11 LAB — FRUCTOSAMINE: Fructosamine: 316 umol/L — ABNORMAL HIGH (ref 0–285)

## 2021-03-16 ENCOUNTER — Other Ambulatory Visit: Payer: Self-pay

## 2021-03-16 ENCOUNTER — Ambulatory Visit (INDEPENDENT_AMBULATORY_CARE_PROVIDER_SITE_OTHER): Payer: 59 | Admitting: Endocrinology

## 2021-03-16 VITALS — BP 162/70 | HR 54 | Ht 73.0 in | Wt 196.2 lb

## 2021-03-16 DIAGNOSIS — Z794 Long term (current) use of insulin: Secondary | ICD-10-CM | POA: Diagnosis not present

## 2021-03-16 DIAGNOSIS — I10 Essential (primary) hypertension: Secondary | ICD-10-CM

## 2021-03-16 DIAGNOSIS — E1165 Type 2 diabetes mellitus with hyperglycemia: Secondary | ICD-10-CM

## 2021-03-16 DIAGNOSIS — R600 Localized edema: Secondary | ICD-10-CM

## 2021-03-16 NOTE — Progress Notes (Signed)
Patient ID: Nicholas Whitehead, male   DOB: 03/10/1955, 66 y.o.   MRN: [id number]           Patient ID: Nicholas Whitehead, male   DOB: 08-08-1955, 66 y.o.   MRN: [id number]   Reason for Appointment: Type II Diabetes follow-up   History of Present Illness   Diagnosis date: 1998    Previous history: He was started on oral agents at diagnosis and around 2006 was put on insulin His blood sugars are usually well controlled when he takes Actos along with his insulin. He had been on Invokana since 04/2013  He requires large doses of insulin especially basal insulin   Recent history:   Insulin regimen: Toujeo: 40 units p.m.;  Novolog 30 units before meals         Oral hypoglycemic drugs: Metformin ER 1000 mg twice a day, Actos 30 mg, Ozempic 0.5 weekly, Farxiga 70m  A1c is 8.6 last as of 12/22   Fructosamine 316, previously 298  Current management, blood sugar patterns and problems: He is back on his Toujeo which had been previously stopped and he has gone up 10 units from the suggested dose of 30 units  However he had inconsistent blood sugars in the mornings  Also he has very inconsistent mealtimes and not clear which readings are after meals  Also because of various difficulties he has not been able to eat consistent meals or getting balanced meals However he has had blood sugars as high as 278 after lunch and likely is not taking his NovoLog before he starts eating  No consistent pattern of high blood sugars  He does think he is taking his Ozempic and FIranregularly However his weight appears to fluctuate Not able to do much walking for exercise     Side effects from medications:  urgency, [place]: 1-3 times a day  Glucometer:   Accu-Chek  Blood sugar readings by review of meter:  PRE-MEAL Fasting Lunch Dinner Bedtime Overall  Glucose range: 105-163   196   Mean/median:     173   POST-MEAL PC Breakfast PC Lunch PC Dinner   Glucose range:  169, 278   Mean/median:      Previously:  PRE-MEAL Fasting Lunch Dinner Bedtime Overall  Glucose range:     84-265  Mean/median:  150  151  137  156 152   POST-MEAL PC Breakfast PC Lunch PC Dinner  Glucose range:     Mean/median:   152            Meals: Breakfast variable, 9-10 AM Lunch 2-3pm  usually avoiding high-fat meals, dinner at about 6 pm          Dietician visit: Most recent: 2013    Weight control:  Wt Readings from Last 3 Encounters:  03/16/21 196 lb 3.2 oz (89 kg)  02/01/21 186 lb 6.4 oz (84.6 kg)  01/21/21 198 lb (89.8 kg)          Diabetes labs:  Lab Results  Component Value Date   HGBA1C 8.6 (H) 12/20/2020   HGBA1C 6.7 (H) 06/10/2020   HGBA1C 6.7 (H) 02/20/2020   Lab Results  Component Value Date   MICROALBUR 1.4 04/29/2020   LDLCALC 58 03/10/2021   CREATININE 1.20 03/10/2021   Lab Results  Component Value Date   FRUCTOSAMINE 316 (H) 03/10/2021   FRUCTOSAMINE 367 (H) 02/01/2021   FRUCTOSAMINE 280 09/11/2019  FRUCTOSAMINE 298 (H) 04/28/2019   FRUCTOSAMINE 276 09/13/2018    Other active problems discussed today: See review of systems   Lab on 03/10/2021  Component Date Value Ref Range Status   Cholesterol 03/10/2021 128  0 - 200 mg/dL Final   ATP III Classification       Desirable:  < 200 mg/dL               Borderline High:  200 - 239 mg/dL          High:  > = 240 mg/dL   Triglycerides 03/10/2021 122.0  0.0 - 149.0 mg/dL Final   Normal:  <150 mg/dLBorderline High:  150 - 199 mg/dL   HDL 03/10/2021 45.70  >39.00 mg/dL Final   VLDL 03/10/2021 24.4  0.0 - 40.0 mg/dL Final   LDL Cholesterol 03/10/2021 58  0 - 99 mg/dL Final   Total CHOL/HDL Ratio 03/10/2021 3   Final                  Men          Women1/2 Average Risk     3.4          3.3Average Risk          5.0          4.42X Average Risk          9.6          7.13X Average Risk          15.0          11.0                       NonHDL 03/10/2021 82.06   Final   NOTE:   Non-HDL goal should be 30 mg/dL higher than patient's LDL goal (i.e. LDL goal of < 70 mg/dL, would have non-HDL goal of < 100 mg/dL)   Fructosamine 03/10/2021 316 (H)  0 - 285 umol/L Final   Comment: Published reference interval for apparently healthy subjects between age 71 and 35 is 53 - 285 umol/L and in a poorly controlled diabetic population is 228 - 563 umol/L with a mean of 396 umol/L.    Sodium 03/10/2021 138  135 - 145 mEq/L Final   Potassium 03/10/2021 4.4  3.5 - 5.1 mEq/L Final   Chloride 03/10/2021 104  96 - 112 mEq/L Final   CO2 03/10/2021 29  19 - 32 mEq/L Final   Glucose, Bld 03/10/2021 151 (H)  70 - 99 mg/dL Final   BUN 03/10/2021 14  6 - 23 mg/dL Final   Creatinine, Ser 03/10/2021 1.20  0.40 - 1.50 mg/dL Final   GFR 03/10/2021 64.43  >60.00 mL/min Final   Calculated using the CKD-EPI Creatinine Equation (2021)   Calcium 03/10/2021 9.4  8.4 - 10.5 mg/dL Final     Allergies as of 03/16/2021       Reactions   Benazepril Cough   Molds & Smuts    Hard to breathe   Pravastatin Other (See Comments)   myositis   Glipizide Nausea Only        Medication List        Accurate as of March 16, 2021  3:59 PM. If you have any questions, ask your nurse or doctor.          Accu-Chek Guide test strip Generic drug: glucose blood TEST BLOOD SUGAR FOUR TIMES DAILY   albuterol 108 (90 Base) MCG/ACT inhaler  Commonly known as: VENTOLIN HFA Inhale 2 puffs into the lungs every 6 (six) hours as needed for wheezing or shortness of breath.   aspirin EC 81 MG tablet Take 81 mg by mouth every morning.   B-D SINGLE USE SWABS REGULAR Pads USE AS DIRECTED   B-D ULTRAFINE III SHORT PEN 31G X 8 MM Misc Generic drug: Insulin Pen Needle USE TO INJECT INSULIN AS DIRECTED   Bevespi Aerosphere 9-4.8 MCG/ACT Aero Generic drug: Glycopyrrolate-Formoterol INHALE 2 PUFFS INTO THE LUNGS TWO TIMES DAILY   cetirizine 10 MG tablet Commonly known as: ZYRTEC Take 10 mg by mouth daily.    Cholecalciferol 50 MCG (2000 UT) Tabs Take 1 tablet (2,000 Units total) by mouth daily.   clotrimazole-betamethasone cream Commonly known as: Lotrisone Apply 1 application topically 2 (two) times daily.   dapagliflozin propanediol 10 MG Tabs tablet Commonly known as: Farxiga Take 1 tablet (10 mg total) by mouth daily before breakfast.   DULoxetine 60 MG capsule Commonly known as: CYMBALTA TAKE 1 CAPSULE(60 MG) BY MOUTH DAILY   Eszopiclone 3 MG Tabs TAKE 1 TABLET BY MOUTH AT BEDTIME   gabapentin 300 MG capsule Commonly known as: NEURONTIN TAKE 1 CAPSULE BY MOUTH THREE TIMES DAILY   Glucagon Emergency 1 MG Kit SMARTSIG:1 SUB-Q As Directed   Gvoke HypoPen 2-Pack 1 MG/0.2ML Soaj Generic drug: Glucagon Inject 1 Act into the skin daily as needed.   HumaLOG KwikPen 100 UNIT/ML KwikPen Generic drug: insulin lispro ADMINISTER 25 TO 30 UNITS UNDER THE SKIN THREE TIMES DAILY   Lancets Misc Use to test blood sugar tid. DX: E11.8   linaclotide 72 MCG capsule Commonly known as: Linzess Take 1 capsule (72 mcg total) by mouth daily before breakfast.   Livalo 1 MG Tabs Generic drug: Pitavastatin Calcium TAKE 1 TABLET(1 MG) BY MOUTH DAILY   Nebivolol HCl 20 MG Tabs TAKE 1 TABLET(20 MG) BY MOUTH DAILY   Odefsey 200-25-25 MG Tabs tablet Generic drug: emtricitabine-rilpivir-tenofovir AF Take 1 tablet by mouth daily.   olmesartan 40 MG tablet Commonly known as: BENICAR TAKE 1 TABLET(40 MG) BY MOUTH DAILY   Ozempic (0.25 or 0.5 MG/DOSE) 2 MG/1.5ML Sopn Generic drug: Semaglutide(0.25 or 0.5MG/DOS) INJECT 0.25MG UNDER THE SKIN WEEKLY FOR 4 WEEKS, THEN INJECT 0.5MG UNDER THE SKIN ONCE A WEEK   pioglitazone 30 MG tablet Commonly known as: ACTOS TAKE 1 TABLET(30 MG) BY MOUTH DAILY   torsemide 20 MG tablet Commonly known as: Demadex Take 1 tablet (20 mg total) by mouth daily.   Toujeo SoloStar 300 UNIT/ML Solostar Pen Generic drug: insulin glargine (1 Unit Dial) Inject 80  Units into the skin daily.   traZODone 150 MG tablet Commonly known as: DESYREL TAKE 3 TABLETS(450 MG) BY MOUTH AT BEDTIME   triamcinolone cream 0.5 % Commonly known as: KENALOG APPLY 1 APPLICATION TOPICALLY THREE TIME A DAY        Allergies:  Allergies  Allergen Reactions   Benazepril Cough   Molds & Smuts     Hard to breathe   Pravastatin Other (See Comments)    myositis   Glipizide Nausea Only    Past Medical History:  Diagnosis Date   Anxiety    Cataract    right    Depression    Diabetes mellitus without complication (HCC)    DM (diabetes mellitus screen)    HIV disease ([place]) 09/12/1995   HIV infection ([place])    Hypercholesterolemia    Hyperlipidemia    Hypertension  Past Surgical History:  Procedure Laterality Date   COLONOSCOPY WITH PROPOFOL  01/16/2012   Procedure: COLONOSCOPY WITH PROPOFOL;  Surgeon: Garlan Fair, MD;  Location: [facility];  Service: Endoscopy;  Laterality: N/A;   EYE SURGERY  2005   catarct surgery   optic lens surgery  2005    Family History  Problem Relation Age of Onset   Arthritis Mother    Diabetes Mother    Heart disease Mother    Stroke Mother    Arthritis Father    Diabetes Father    Heart disease Father     Social History:  reports that he has been smoking cigarettes. He has been smoking an average of .25 packs per day. He has never used smokeless tobacco. He reports that he does not drink alcohol and does not use drugs.  Review of Systems:  Hypertension:  on Bystolic 20 mg and on Benicar 40 mg, prescribed by his PCP He says his blood pressure is high today because of being anxious  BP Readings from Last 3 Encounters:  03/16/21 (!) 162/70  02/01/21 138/60  01/18/21 104/74   RENAL function: Previously abnormal, recently has not been taking any diuretics  Lab Results  Component Value Date   CREATININE 1.20 03/10/2021   CREATININE 1.52 (H) 02/01/2021   CREATININE 0.93 12/20/2020    Lipids: LDL is at  target as follows Treatment prescribed by PCP, he was on Livalo 1 mg   Lab Results  Component Value Date   CHOL 128 03/10/2021   HDL 45.70 03/10/2021   LDLCALC 58 03/10/2021   TRIG 122.0 03/10/2021   CHOLHDL 3 03/10/2021    Neuropathy: Has had paresthesiae in feet and legs, these are treated with gabapentin 300 mg 3 times a day   Forms for the diabetic shoes were sent in July 2021 to the podiatrist. Has sensory loss on exam He had surgery for foot ulcer on the right side     Examination:   BP (!) 162/70    Pulse (!) 54    Ht 6' 1"  (1.854 m)    Wt 196 lb 3.2 oz (89 kg)    SpO2 94%    BMI 25.89 kg/m   Body mass index is 25.89 kg/m.    2+ ankle edema present  ASSESSMENT/ PLAN:    Diabetes type 2 on insulin:   See history of present illness for detailed discussion of his current management, blood sugar patterns and problems identified  His A1c is last 8.6   Fructosamine is moderately increased at 316  He is on basal bolus insulin, Metformin, Farxiga, Ozempic 0.5 mg weekly and Actos 30 mg  He has relatively better fasting readings with getting back on Toujeo and currently taking 40 units However he is not likely getting consistent good control of his postprandial range during the day especially with not remembering to take his NovoLog before eating This may be causing some high readings late at night also Monitoring has been sporadic  He has used the freestyle libre before but again has not started using this again His motivation level has been variable  Recommendations:  He will change the Toujeo to morning If his morning sugars are consistently over 140 he will go up to 44 units Make sure he takes his HUMALOG before each meal Also if eating more carbohydrate he can go up to 35 units before eating Restart using freestyle libre and discussed blood sugar targets both before and after meals  Hypertension: Blood pressure high today but he says this is from  anxiety  Ankle edema: He is not taking Demadex on his own but discussed that since he has significant edema he needs to take this at least every other day  LIPIDS: Has excellent control of cholesterol and no high triglycerides   Patient Instructions  Take 25-35 HUmalog ALWAYS BEFORE each meal  Toujeo 40 units once daily in am  If am sugar > 140 for 3 days go to 44 Toujeo  Take torsemide 3 days a week  Restart Libre   Total visit time including counseling = 30 minutes    Elayne Snare 03/16/2021, 3:59 PM           ..

## 2021-03-16 NOTE — Patient Instructions (Addendum)
Take 25-35 HUmalog ALWAYS BEFORE each meal ? ?Toujeo 40 units once daily in am ? ?If am sugar > 140 for 3 days go to 44 Toujeo ? ?Take torsemide 3 days a week ? ?Restart Libre ?

## 2021-03-22 ENCOUNTER — Ambulatory Visit (INDEPENDENT_AMBULATORY_CARE_PROVIDER_SITE_OTHER): Payer: 59 | Admitting: Internal Medicine

## 2021-03-22 ENCOUNTER — Encounter: Payer: Self-pay | Admitting: Internal Medicine

## 2021-03-22 ENCOUNTER — Other Ambulatory Visit: Payer: Self-pay

## 2021-03-22 VITALS — BP 132/68 | HR 60 | Temp 98.1°F | Resp 16 | Ht 73.0 in | Wt 193.0 lb

## 2021-03-22 DIAGNOSIS — F5105 Insomnia due to other mental disorder: Secondary | ICD-10-CM

## 2021-03-22 DIAGNOSIS — E1140 Type 2 diabetes mellitus with diabetic neuropathy, unspecified: Secondary | ICD-10-CM | POA: Diagnosis not present

## 2021-03-22 DIAGNOSIS — N1831 Chronic kidney disease, stage 3a: Secondary | ICD-10-CM | POA: Diagnosis not present

## 2021-03-22 DIAGNOSIS — I119 Hypertensive heart disease without heart failure: Secondary | ICD-10-CM

## 2021-03-22 DIAGNOSIS — I10 Essential (primary) hypertension: Secondary | ICD-10-CM | POA: Diagnosis not present

## 2021-03-22 DIAGNOSIS — F419 Anxiety disorder, unspecified: Secondary | ICD-10-CM

## 2021-03-22 DIAGNOSIS — F409 Phobic anxiety disorder, unspecified: Secondary | ICD-10-CM

## 2021-03-22 DIAGNOSIS — Z23 Encounter for immunization: Secondary | ICD-10-CM | POA: Diagnosis not present

## 2021-03-22 DIAGNOSIS — Z794 Long term (current) use of insulin: Secondary | ICD-10-CM | POA: Diagnosis not present

## 2021-03-22 DIAGNOSIS — E785 Hyperlipidemia, unspecified: Secondary | ICD-10-CM

## 2021-03-22 DIAGNOSIS — Z720 Tobacco use: Secondary | ICD-10-CM

## 2021-03-22 DIAGNOSIS — F32A Depression, unspecified: Secondary | ICD-10-CM

## 2021-03-22 LAB — BASIC METABOLIC PANEL
BUN: 17 mg/dL (ref 6–23)
CO2: 32 mEq/L (ref 19–32)
Calcium: 9.6 mg/dL (ref 8.4–10.5)
Chloride: 101 mEq/L (ref 96–112)
Creatinine, Ser: 1.49 mg/dL (ref 0.40–1.50)
GFR: 49.68 mL/min — ABNORMAL LOW (ref >60.00)
Glucose, Bld: 121 mg/dL — ABNORMAL HIGH (ref 70–99)
Potassium: 4.4 mEq/L (ref 3.5–5.1)
Sodium: 137 mEq/L (ref 135–145)

## 2021-03-22 LAB — MICROALBUMIN / CREATININE URINE RATIO
Creatinine,U: 46.4 mg/dL
Microalb Creat Ratio: 1.5 mg/g (ref 0.0–30.0)
Microalb, Ur: <0.7 mg/dL (ref 0.0–1.9)

## 2021-03-22 LAB — URINALYSIS, ROUTINE W REFLEX MICROSCOPIC
Bilirubin Urine: NEGATIVE
Hgb urine dipstick: NEGATIVE
Ketones, ur: NEGATIVE
Leukocytes,Ua: NEGATIVE
Nitrite: NEGATIVE
RBC / HPF: NONE SEEN (ref 0–?)
Specific Gravity, Urine: <=1.005 — AB (ref 1.000–1.030)
Total Protein, Urine: NEGATIVE
Urine Glucose: >=1000 — AB
Urobilinogen, UA: 0.2 (ref 0.0–1.0)
WBC, UA: NONE SEEN (ref 0–?)
pH: 6 (ref 5.0–8.0)

## 2021-03-22 LAB — HEMOGLOBIN A1C: Hgb A1c MFr Bld: 8.1 % — ABNORMAL HIGH (ref 4.6–6.5)

## 2021-03-22 MED ORDER — TRAZODONE HCL 150 MG PO TABS: ORAL_TABLET | ORAL | 1 refills | Status: DC

## 2021-03-22 MED ORDER — BELSOMRA 20 MG PO TABS: 20.0000 mg | ORAL_TABLET | Freq: Every evening | ORAL | 0 refills | Status: DC | PRN

## 2021-03-22 MED ORDER — ZOLPIDEM TARTRATE ER 6.25 MG PO TBCR: 6.2500 mg | EXTENDED_RELEASE_TABLET | Freq: Every evening | ORAL | 0 refills | Status: DC | PRN

## 2021-03-22 MED ORDER — LIVALO 1 MG PO TABS: 1.0000 | ORAL_TABLET | Freq: Every day | ORAL | 1 refills | Status: DC

## 2021-03-22 NOTE — Progress Notes (Signed)
? ?Subjective:  ?Patient ID: Nicholas Whitehead, male    DOB: Dec 29, 1955  Age: 66 y.o. MRN: [id number] ? ?CC: Hypertension and Diabetes ? ?This visit occurred during the SARS-CoV-2 public health emergency.  Safety protocols were in place, including screening questions prior to the visit, additional usage of staff PPE, and extensive cleaning of exam room while observing appropriate contact time as indicated for disinfecting solutions.   ? ?HPI ?Nicholas Whitehead presents for f/up -  ? ?He complains of persistent, unchanged SOB but denies cough, CP, diaphoresis, or edema. ? ? ?Outpatient Medications Prior to Visit  ?Medication Sig Dispense Refill  ? albuterol (PROVENTIL HFA;VENTOLIN HFA) 108 (90 Base) MCG/ACT inhaler Inhale 2 puffs into the lungs every 6 (six) hours as needed for wheezing or shortness of breath. 1 Inhaler 5  ? Alcohol Swabs (B-D SINGLE USE SWABS REGULAR) PADS USE AS DIRECTED 300 each 1  ? aspirin EC 81 MG tablet Take 81 mg by mouth every morning.    ? BEVESPI AEROSPHERE 9-4.8 MCG/ACT AERO INHALE 2 PUFFS INTO THE LUNGS TWO TIMES DAILY 32.1 g 1  ? cetirizine (ZYRTEC) 10 MG tablet Take 10 mg by mouth daily.    ? Cholecalciferol 2000 units TABS Take 1 tablet (2,000 Units total) by mouth daily. 90 tablet 1  ? clotrimazole-betamethasone (LOTRISONE) cream Apply 1 application topically 2 (two) times daily. 30 g 2  ? dapagliflozin propanediol (FARXIGA) 10 MG TABS tablet Take 1 tablet (10 mg total) by mouth daily before breakfast. 90 tablet 1  ? DULoxetine (CYMBALTA) 60 MG capsule TAKE 1 CAPSULE(60 MG) BY MOUTH DAILY 90 capsule 1  ? emtricitabine-rilpivir-tenofovir AF (ODEFSEY) 200-25-25 MG TABS tablet Take 1 tablet by mouth daily. 90 tablet 3  ? gabapentin (NEURONTIN) 300 MG capsule TAKE 1 CAPSULE BY MOUTH THREE TIMES DAILY 270 capsule 1  ? Glucagon (GVOKE HYPOPEN 2-PACK) 1 MG/0.2ML SOAJ Inject 1 Act into the skin daily as needed. 2 mL 6  ? Glucagon, rDNA, (GLUCAGON EMERGENCY) 1 MG KIT     ? glucose blood (ACCU-CHEK  GUIDE) test strip TEST BLOOD SUGAR FOUR TIMES DAILY 400 strip 3  ? HUMALOG KWIKPEN 100 UNIT/ML KwikPen ADMINISTER 25 TO 30 UNITS UNDER THE SKIN THREE TIMES DAILY 30 mL 3  ? insulin glargine, 1 Unit Dial, (TOUJEO SOLOSTAR) 300 UNIT/ML Solostar Pen Inject 80 Units into the skin daily. 6 mL 3  ? Insulin Pen Needle (B-D ULTRAFINE III SHORT PEN) 31G X 8 MM MISC USE TO INJECT INSULIN AS DIRECTED 500 each 1  ? Lancets MISC Use to test blood sugar tid. DX: E11.8 300 each 3  ? linaclotide (LINZESS) 72 MCG capsule Take 1 capsule (72 mcg total) by mouth daily before breakfast. 90 capsule 1  ? Nebivolol HCl 20 MG TABS TAKE 1 TABLET(20 MG) BY MOUTH DAILY 90 tablet 0  ? olmesartan (BENICAR) 40 MG tablet TAKE 1 TABLET(40 MG) BY MOUTH DAILY 90 tablet 1  ? OZEMPIC, 0.25 OR 0.5 MG/DOSE, 2 MG/1.5ML SOPN INJECT 0.25MG UNDER THE SKIN WEEKLY FOR 4 WEEKS, THEN INJECT 0.5MG UNDER THE SKIN ONCE A WEEK 1.5 mL 5  ? pioglitazone (ACTOS) 30 MG tablet TAKE 1 TABLET(30 MG) BY MOUTH DAILY 90 tablet 1  ? torsemide (DEMADEX) 20 MG tablet Take 1 tablet (20 mg total) by mouth daily. 90 tablet 0  ? triamcinolone cream (KENALOG) 0.5 % APPLY 1 APPLICATION TOPICALLY THREE TIME A DAY 100 g 3  ? Eszopiclone 3 MG TABS TAKE 1 TABLET BY MOUTH AT BEDTIME  90 tablet 0  ? LIVALO 1 MG TABS TAKE 1 TABLET(1 MG) BY MOUTH DAILY 90 tablet 1  ? traZODone (DESYREL) 150 MG tablet TAKE 3 TABLETS(450 MG) BY MOUTH AT BEDTIME 270 tablet 1  ? ?No facility-administered medications prior to visit.  ? ? ?ROS ?Review of Systems  ?Constitutional:  Negative for chills, diaphoresis, fatigue and fever.  ?HENT: Negative.  Negative for sore throat and trouble swallowing.   ?Eyes: Negative.   ?Respiratory:  Positive for shortness of breath. Negative for cough, chest tightness, wheezing and stridor.   ?Cardiovascular:  Negative for chest pain, palpitations and leg swelling.  ?Gastrointestinal:  Negative for abdominal pain, constipation, diarrhea, nausea and vomiting.  ?Endocrine: Negative.    ?Genitourinary: Negative.  Negative for dysuria.  ?Musculoskeletal: Negative.  Negative for arthralgias.  ?Skin: Negative.  Negative for color change and pallor.  ?Neurological: Negative.  Negative for dizziness, weakness and headaches.  ?Hematological:  Negative for adenopathy. Does not bruise/bleed easily.  ?Psychiatric/Behavioral:  Positive for dysphoric mood and sleep disturbance. Negative for decreased concentration, self-injury and suicidal ideas. The patient is not nervous/anxious.   ? ?Objective:  ?BP 132/68 (BP Location: Left Arm, Patient Position: Sitting, Cuff Size: Large)   Pulse 60   Temp 98.1 ?F (36.7 ?C) (Oral)   Resp 16   Ht 6' 1"  (1.854 m)   Wt 193 lb (87.5 kg)   SpO2 94%   BMI 25.46 kg/m?  ? ?BP Readings from Last 3 Encounters:  ?03/22/21 132/68  ?03/16/21 (!) 162/70  ?02/01/21 138/60  ? ? ?Wt Readings from Last 3 Encounters:  ?03/22/21 193 lb (87.5 kg)  ?03/16/21 196 lb 3.2 oz (89 kg)  ?02/01/21 186 lb 6.4 oz (84.6 kg)  ? ? ?Physical Exam ?HENT:  ?   Nose: Nose normal.  ?   Mouth/Throat:  ?   Mouth: Mucous membranes are moist.  ?Eyes:  ?   General: No scleral icterus. ?   Conjunctiva/sclera: Conjunctivae normal.  ?Cardiovascular:  ?   Rate and Rhythm: Normal rate and regular rhythm.  ?   Heart sounds: No murmur heard. ?Pulmonary:  ?   Effort: Pulmonary effort is normal.  ?   Breath sounds: No stridor. No wheezing, rhonchi or rales.  ?Abdominal:  ?   General: Abdomen is flat.  ?   Palpations: There is no mass.  ?   Tenderness: There is no abdominal tenderness. There is no guarding.  ?   Hernia: No hernia is present.  ?Musculoskeletal:     ?   General: Normal range of motion.  ?   Cervical back: Neck supple.  ?   Right lower leg: No edema.  ?   Left lower leg: No edema.  ?Lymphadenopathy:  ?   Cervical: No cervical adenopathy.  ?Skin: ?   General: Skin is warm and dry.  ?Neurological:  ?   General: No focal deficit present.  ?   Mental Status: He is alert.  ?Psychiatric:     ?   Mood and  Affect: Mood normal.     ?   Behavior: Behavior normal.  ? ? ?Lab Results  ?Component Value Date  ? WBC 6.4 12/20/2020  ? HGB 13.3 12/20/2020  ? HCT 39.4 12/20/2020  ? PLT 164.0 12/20/2020  ? GLUCOSE 121 (H) 03/22/2021  ? CHOL 128 03/10/2021  ? TRIG 122.0 03/10/2021  ? HDL 45.70 03/10/2021  ? [place] 58 03/10/2021  ? ALT 19 06/10/2020  ? AST 23 06/10/2020  ? NA  137 03/22/2021  ? K 4.4 03/22/2021  ? CL 101 03/22/2021  ? CREATININE 1.49 03/22/2021  ? BUN 17 03/22/2021  ? CO2 32 03/22/2021  ? TSH 1.45 01/19/2020  ? PSA 1.36 04/29/2020  ? HGBA1C 8.1 (H) 03/22/2021  ? MICROALBUR <0.7 03/22/2021  ? ? ?No results found. ? ?Assessment & Plan:  ? ?Nicholas Whitehead was seen today for hypertension and diabetes. ? ?Diagnoses and all orders for this visit: ? ?Type 2 diabetes mellitus with diabetic neuropathy, with long-term current use of insulin ([place])- His A1c has improved.  This is managed by endocrinology. ?-     Microalbumin / creatinine urine ratio; Future ?-     Basic metabolic panel; Future ?-     Hemoglobin A1c; Future ?-     Hemoglobin A1c ?-     Basic metabolic panel ?-     Microalbumin / creatinine urine ratio ? ?Hypertensive left ventricular hypertrophy, without heart failure- He has a normal volume status. ?-     Cancel: Urinalysis, Routine w reflex microscopic; Future ?-     Basic metabolic panel; Future ?-     Basic metabolic panel ? ?Primary hypertension- His blood pressure is adequately well controlled. ?-     Urinalysis, Routine w reflex microscopic; Future ?-     Cancel: Urinalysis, Routine w reflex microscopic; Future ?-     Basic metabolic panel; Future ?-     Basic metabolic panel ?-     Urinalysis, Routine w reflex microscopic ? ?Stage 3a chronic kidney disease ([place])- His renal function is stable. ?-     Urinalysis, Routine w reflex microscopic; Future ?-     Microalbumin / creatinine urine ratio; Future ?-     Cancel: Urinalysis, Routine w reflex microscopic; Future ?-     Basic metabolic panel; Future ?-     Basic  metabolic panel ?-     Microalbumin / creatinine urine ratio ?-     Urinalysis, Routine w reflex microscopic ? ?Insomnia due to anxiety and fear- He complains that Nicholas Whitehead is not effective and he did not tolera

## 2021-03-23 ENCOUNTER — Telehealth: Payer: Self-pay

## 2021-03-23 NOTE — Telephone Encounter (Signed)
[facility]TA Eligibility provided at 3/14 OV has been completed, signed and faxed back. ? ?Original has been mailed back to pt for his records.  ?Copy filed with [facility].  ?

## 2021-03-24 ENCOUNTER — Encounter: Payer: Self-pay | Admitting: Internal Medicine

## 2021-03-28 ENCOUNTER — Other Ambulatory Visit: Payer: Self-pay | Admitting: Internal Medicine

## 2021-03-28 DIAGNOSIS — I10 Essential (primary) hypertension: Secondary | ICD-10-CM

## 2021-04-04 ENCOUNTER — Other Ambulatory Visit: Payer: Self-pay | Admitting: Internal Medicine

## 2021-04-04 DIAGNOSIS — I10 Essential (primary) hypertension: Secondary | ICD-10-CM

## 2021-04-04 DIAGNOSIS — E1140 Type 2 diabetes mellitus with diabetic neuropathy, unspecified: Secondary | ICD-10-CM

## 2021-04-19 ENCOUNTER — Other Ambulatory Visit: Payer: Self-pay | Admitting: Internal Medicine

## 2021-04-19 DIAGNOSIS — E1140 Type 2 diabetes mellitus with diabetic neuropathy, unspecified: Secondary | ICD-10-CM

## 2021-04-25 ENCOUNTER — Telehealth: Payer: Self-pay

## 2021-04-25 ENCOUNTER — Other Ambulatory Visit: Payer: Self-pay | Admitting: Internal Medicine

## 2021-04-25 DIAGNOSIS — F409 Phobic anxiety disorder, unspecified: Secondary | ICD-10-CM

## 2021-04-25 MED ORDER — ZOLPIDEM TARTRATE ER 6.25 MG PO TBCR: 6.2500 mg | EXTENDED_RELEASE_TABLET | Freq: Every evening | ORAL | 0 refills | Status: DC | PRN

## 2021-04-25 NOTE — Telephone Encounter (Signed)
Pt is requesting a refill on: ?zolpidem (AMBIEN CR) 6.25 MG CR tablet ? ?Pharmacy: ?[facility] [place], [place] ? ?LOV 03/22/21  ?ROV 09/22/21 ?

## 2021-04-26 ENCOUNTER — Telehealth: Payer: Self-pay

## 2021-04-26 NOTE — Telephone Encounter (Signed)
Key: [id number] ?

## 2021-04-27 NOTE — Telephone Encounter (Signed)
Per CoverMyMeds: ? ?PA was denied.  ?

## 2021-04-28 ENCOUNTER — Other Ambulatory Visit: Payer: Self-pay | Admitting: Internal Medicine

## 2021-04-28 ENCOUNTER — Ambulatory Visit (INDEPENDENT_AMBULATORY_CARE_PROVIDER_SITE_OTHER): Payer: 59

## 2021-04-28 DIAGNOSIS — E785 Hyperlipidemia, unspecified: Secondary | ICD-10-CM

## 2021-04-28 DIAGNOSIS — I10 Essential (primary) hypertension: Secondary | ICD-10-CM

## 2021-04-28 DIAGNOSIS — J410 Simple chronic bronchitis: Secondary | ICD-10-CM

## 2021-04-28 DIAGNOSIS — E1140 Type 2 diabetes mellitus with diabetic neuropathy, unspecified: Secondary | ICD-10-CM

## 2021-04-28 DIAGNOSIS — K5904 Chronic idiopathic constipation: Secondary | ICD-10-CM

## 2021-04-28 DIAGNOSIS — N1831 Chronic kidney disease, stage 3a: Secondary | ICD-10-CM

## 2021-04-28 MED ORDER — BEVESPI AEROSPHERE 9-4.8 MCG/ACT IN AERO: 2.0000 | INHALATION_SPRAY | Freq: Two times a day (BID) | RESPIRATORY_TRACT | 1 refills | Status: AC

## 2021-04-28 MED ORDER — ALBUTEROL SULFATE HFA 108 (90 BASE) MCG/ACT IN AERS: 2.0000 | INHALATION_SPRAY | Freq: Four times a day (QID) | RESPIRATORY_TRACT | 5 refills | Status: DC | PRN

## 2021-04-28 NOTE — Patient Instructions (Signed)
Visit Information ? ?Following are the goals we discussed today:  ? ?Manage My Medicine  ? ?Timeframe:  Long-Range Goal ?Priority:  High ?Start Date:   02/20/20                          ?Expected End Date:   04/29/2022           ? ?Follow Up Date 10/2021 ?  ?- call for medicine refill 2 or 3 days before it runs out ?- call if I am sick and can't take my medicine ?- keep a list of all the medicines I take; vitamins and herbals too ?- use a pillbox to sort medicine  ?  ?Why is this important?   ?These steps will help you keep on track with your medicines. ? ?Plan: Telephone follow up appointment with care management team member scheduled for:  6 months ?The patient has been provided with contact information for the care management team and has been advised to call with any health related questions or concerns.  ? ?Tomasa Blase, PharmD ?Clinical Pharmacist, [place]  ? ?Please call the care guide team at [phone] if you need to cancel or reschedule your appointment.  ? ?The patient verbalized understanding of instructions, educational materials, and care plan provided today and declined offer to receive copy of patient instructions, educational materials, and care plan.  ? ?

## 2021-04-28 NOTE — Progress Notes (Signed)
? ?Chronic Care Management ?Pharmacy Note ? ?04/28/2021 ?Name:  Hemi Chacko MRN:  [id number] DOB:  [birth year] ? ?Summary: ?-Patient recently restarted toujeo , farxiga, and increased ozempic to 0.32m weekly - using libre again to monitor BG ?-BG improving - typically averaging ~120-160 throughout the day ?-endorses compliance with insulin dosing as well as all of his other medications  ?-Notes that he previously had been using bevespi and albuterol inhalers which helped with his breathing - has not had for some time, can get winded/ SOB after walking - requesting refills  ?-since starting zolpidem has not noticed a major change in sleep quality  ? ?Recommendations/Changes made from today's visit: ?-Recommend for patient to continue to monitor BG with lElenor Legato- should he have issues with hypoglycemia / if BG control is lost, would recommend reaching out to endocrinology to discuss changes to his regimen ?-Will reach out to PCP about refills on inhalers ? ? ?Subjective: ?LLiliana Brentlingeris an 66y.o. year old male who is a primary patient of JJanith Lima MD.  The CCM team was consulted for assistance with disease management and care coordination needs.   ? ?Engaged with patient by telephone for follow up visit in response to provider referral for pharmacy case management and/or care coordination services.  ? ?Consent to Services:  ?The patient was given information about Chronic Care Management services, agreed to services, and gave verbal consent prior to initiation of services.  Please see initial visit note for detailed documentation.  ? ?Patient Care Team: ?JJanith Lima MD as PCP - General (Internal Medicine) ?CThayer Headings MD as PCP - Infectious Diseases (Infectious Diseases) ?SMerita Norton LCSW as Counselor ?DCalvert Cantor MD as Consulting Physician (Ophthalmology) ?STomasa Blase [facility](Pharmacist) ? ?Recent office visits: ?03/22/2021 - Dr. JRonnald Ramp- stop lJohnnye Sima start zolpidem  - f/u in 6 months   ?12/20/2020 - Dr. JRonnald Ramp- restart farxiga, torsemide for edema and BP control ? ?Recent consult visits: ?03/16/2021 - Dr. KDwyane Dee- Endocrinology - counseled on importance of adherence to humalog dosing  - demadex QOD - follow up in 3 months  ?03/01/2021 - Dr. GElisha Ponder- no changes to medications - f/u in 3 months  ?02/01/2021 - Dr. KDwyane Dee- Endocrinology - restart toujeo at 30 units daily  ?01/18/2021 - Dr. CLinus Salmons- Infectious Disease - no changes to medications - f/u in 6 months  ?  ?Hospital visits: ?None in previous 6 months ? ?Objective: ? ?Lab Results  ?Component Value Date  ? CREATININE 1.49 03/22/2021  ? BUN 17 03/22/2021  ? GFR 49.68 (L) 03/22/2021  ? GFRNONAA 77 01/12/2020  ? GFRAA 90 01/12/2020  ? NA 137 03/22/2021  ? K 4.4 03/22/2021  ? CALCIUM 9.6 03/22/2021  ? CO2 32 03/22/2021  ? ? ?Lab Results  ?Component Value Date/Time  ? HGBA1C 8.1 (H) 03/22/2021 12:20 PM  ? HGBA1C 8.6 (H) 12/20/2020 10:10 AM  ? FRUCTOSAMINE 316 (H) 03/10/2021 08:56 AM  ? FRUCTOSAMINE 367 (H) 02/01/2021 10:05 AM  ? GFR 49.68 (L) 03/22/2021 12:20 PM  ? GFR 64.43 03/10/2021 08:56 AM  ? MICROALBUR <0.7 03/22/2021 12:20 PM  ? MICROALBUR 1.4 04/29/2020 02:17 PM  ?  ?Last diabetic Eye exam:  ?Lab Results  ?Component Value Date/Time  ? HMDIABEYEEXA Retinopathy (A) 07/20/2020 12:00 AM  ?  ?Last diabetic Foot exam:  ?Lab Results  ?Component Value Date/Time  ? HMDIABFOOTEX done 05/21/2013 12:00 AM  ?  ? ?Lab Results  ?Component Value Date  ?  CHOL 128 03/10/2021  ? HDL 45.70 03/10/2021  ? [place] 58 03/10/2021  ? TRIG 122.0 03/10/2021  ? CHOLHDL 3 03/10/2021  ? ? ? ?  Latest Ref Rng & Units 06/10/2020  ?  9:24 AM 02/20/2020  ?  7:55 AM 01/12/2020  ? 11:30 AM  ?Hepatic Function  ?Total Protein 6.0 - 8.3 g/dL 7.9   8.1   7.5    ?Albumin 3.5 - 5.2 g/dL 4.5   4.4     ?AST 0 - 37 U/L 23   22   21     ?ALT 0 - 53 U/L 19   22   17     ?Alk Phosphatase 39 - 117 U/L 41   35     ?Total Bilirubin 0.2 - 1.2 mg/dL 0.3   0.3   0.2    ? ? ?Lab Results  ?Component Value  Date/Time  ? TSH 1.45 01/19/2020 10:43 AM  ? TSH 1.21 03/20/2019 12:30 PM  ? ? ? ?  Latest Ref Rng & Units 12/20/2020  ? 10:10 AM 01/12/2020  ? 11:30 AM 03/25/2019  ?  1:19 PM  ?CBC  ?WBC 4.0 - 10.5 K/uL 6.4   7.0   7.3    ?Hemoglobin 13.0 - 17.0 g/dL 13.3   14.4   15.6    ?Hematocrit 39.0 - 52.0 % 39.4   42.4   45.5    ?Platelets 150.0 - 400.0 K/uL 164.0   160   189.0    ? ? ?Lab Results  ?Component Value Date/Time  ? VD25OH 56.40 03/20/2019 12:30 PM  ? VD25OH 34.66 10/23/2017 10:33 AM  ? ? ?Clinical ASCVD: No  ?The ASCVD Risk score (Arnett DK, et al., 2019) failed to calculate for the following reasons: ?  The valid total cholesterol range is 130 to 320 mg/dL   ? ? ?  01/21/2021  ?  3:37 PM 01/18/2021  ? 10:51 AM 12/20/2020  ?  9:03 AM  ?Depression screen PHQ 2/9  ?Decreased Interest 1 1 0  ?Down, Depressed, Hopeless 3 3 0  ?PHQ - 2 Score 4 4 0  ?Altered sleeping 3 3   ?Tired, decreased energy 0 0   ?Change in appetite 3 3   ?Feeling bad or failure about yourself  3 3   ?Trouble concentrating 1 1   ?Moving slowly or fidgety/restless 0 0   ?Suicidal thoughts 0 0   ?PHQ-9 Score 14 14   ?Difficult doing work/chores Extremely dIfficult Extremely dIfficult   ? ? ?Social History  ? ?Tobacco Use  ?Smoking Status Every Day  ? Packs/day: 0.25  ? Types: Cigarettes  ?Smokeless Tobacco Never  ? ?BP Readings from Last 3 Encounters:  ?03/22/21 132/68  ?03/16/21 (!) 162/70  ?02/01/21 138/60  ? ?Pulse Readings from Last 3 Encounters:  ?03/22/21 60  ?03/16/21 (!) 54  ?02/01/21 68  ? ?Wt Readings from Last 3 Encounters:  ?03/22/21 193 lb (87.5 kg)  ?03/16/21 196 lb 3.2 oz (89 kg)  ?02/01/21 186 lb 6.4 oz (84.6 kg)  ? ?BMI Readings from Last 3 Encounters:  ?03/22/21 25.46 kg/m?  ?03/16/21 25.89 kg/m?  ?02/01/21 24.59 kg/m?  ? ? ? ?Assessment/Interventions: Review of patient past medical history, allergies, medications, health status, including review of consultants reports, laboratory and other test data, was performed as part of  comprehensive evaluation and provision of chronic care management services.  ? ?SDOH:  (Social Determinants of Health) assessments and interventions performed: Yes ?  ?SDOH Screenings  ? ?  Alcohol Screen: Low Risk   ? Last Alcohol Screening Score (AUDIT): 0  ?Depression (PHQ2-9): Medium Risk  ? PHQ-2 Score: 14  ?Financial Resource Strain: Low Risk   ? Difficulty of Paying Living Expenses: Not hard at all  ?Food Insecurity: No Food Insecurity  ? Worried About Charity fundraiser in the Last Year: Never true  ? Ran Out of Food in the Last Year: Never true  ?Housing: Low Risk   ? Last Housing Risk Score: 0  ?Physical Activity: Inactive  ? Days of Exercise per Week: 0 days  ? Minutes of Exercise per Session: 0 min  ?Social Connections: Moderately Integrated  ? Frequency of Communication with Friends and Family: More than three times a week  ? Frequency of Social Gatherings with Friends and Family: Never  ? Attends Religious Services: More than 4 times per year  ? Active Member of Clubs or Organizations: No  ? Attends Archivist Meetings: More than 4 times per year  ? Marital Status: Never married  ?Stress: No Stress Concern Present  ? Feeling of Stress : Not at all  ?Tobacco Use: High Risk  ? Smoking Tobacco Use: Every Day  ? Smokeless Tobacco Use: Never  ? Passive Exposure: Not on file  ?Transportation Needs: No Transportation Needs  ? Lack of Transportation (Medical): No  ? Lack of Transportation (Non-Medical): No  ? ? ? ?CCM Care Plan ? ?Allergies  ?Allergen Reactions  ? Benazepril Cough  ? Molds & Smuts   ?  Hard to breathe  ? Pravastatin Other (See Comments)  ?  myositis  ? Glipizide Nausea Only  ? ? ?Medications Reviewed Today   ? ? Reviewed by Tomasa Blase, [facility] (Pharmacist) on 04/28/21 at [phone]  Med List Status: <None>  ? ?Medication Order Taking? Sig Documenting Provider Last Dose Status Informant  ?albuterol (PROVENTIL HFA;VENTOLIN HFA) 108 (90 Base) MCG/ACT inhaler [id number] No Inhale 2 puffs into  the lungs every 6 (six) hours as needed for wheezing or shortness of breath.  ?Patient not taking: Reported on 04/28/2021  ? [name], MD Not Taking Active   ?         ?Med Note Iva Lento, Octavio Graves   Fri Feb 21

## 2021-04-29 ENCOUNTER — Other Ambulatory Visit (INDEPENDENT_AMBULATORY_CARE_PROVIDER_SITE_OTHER): Payer: 59

## 2021-04-29 DIAGNOSIS — Z794 Long term (current) use of insulin: Secondary | ICD-10-CM | POA: Diagnosis not present

## 2021-04-29 DIAGNOSIS — E1165 Type 2 diabetes mellitus with hyperglycemia: Secondary | ICD-10-CM

## 2021-04-29 LAB — HEMOGLOBIN A1C: Hgb A1c MFr Bld: 7.3 % — ABNORMAL HIGH (ref 4.6–6.5)

## 2021-04-29 LAB — BASIC METABOLIC PANEL
BUN: 20 mg/dL (ref 6–23)
CO2: 32 mEq/L (ref 19–32)
Calcium: 9.2 mg/dL (ref 8.4–10.5)
Chloride: 99 mEq/L (ref 96–112)
Creatinine, Ser: 1.41 mg/dL (ref 0.40–1.50)
GFR: 53.04 mL/min — ABNORMAL LOW (ref >60.00)
Glucose, Bld: 161 mg/dL — ABNORMAL HIGH (ref 70–99)
Potassium: 3.9 mEq/L (ref 3.5–5.1)
Sodium: 139 mEq/L (ref 135–145)

## 2021-04-29 LAB — MICROALBUMIN / CREATININE URINE RATIO
Creatinine,U: 22.6 mg/dL
Microalb Creat Ratio: 3.1 mg/g (ref 0.0–30.0)
Microalb, Ur: <0.7 mg/dL (ref 0.0–1.9)

## 2021-05-03 ENCOUNTER — Ambulatory Visit (INDEPENDENT_AMBULATORY_CARE_PROVIDER_SITE_OTHER): Payer: 59 | Admitting: Endocrinology

## 2021-05-03 VITALS — BP 120/70 | HR 60 | Ht 73.0 in | Wt 194.8 lb

## 2021-05-03 DIAGNOSIS — Z794 Long term (current) use of insulin: Secondary | ICD-10-CM

## 2021-05-03 DIAGNOSIS — E1165 Type 2 diabetes mellitus with hyperglycemia: Secondary | ICD-10-CM

## 2021-05-03 DIAGNOSIS — R600 Localized edema: Secondary | ICD-10-CM

## 2021-05-03 DIAGNOSIS — I10 Essential (primary) hypertension: Secondary | ICD-10-CM | POA: Diagnosis not present

## 2021-05-03 NOTE — Progress Notes (Signed)
Patient ID: Nicholas Whitehead, male   DOB: 31-Oct-1955, 66 y.o.   MRN: [id number] ?       ? ?  ?Patient ID: Nicholas Whitehead, male   DOB: Sep 27, 1955, 66 y.o.   MRN: [id number] ? ? ?Reason for Appointment: Type II Diabetes follow-up  ? ?History of Present Illness  ? ?Diagnosis date: 1998   ? ?Previous history: He was started on oral agents at diagnosis and around 2006 was put on insulin ?His blood sugars are usually well controlled when he takes Actos along with his insulin. ?He had been on Invokana since 04/2013 ? ?He requires large doses of insulin especially basal insulin  ? ?Recent history:  ? ?Insulin regimen: Toujeo: 40 units am;  Novolog 30 units before meals        ? ?Oral hypoglycemic drugs: Metformin ER 1000 mg twice a day, Actos 30 mg, Ozempic 0.5 weekly, Farxiga 62m ? ?A1c is 7.3 compared to 8.6 ? ? ?Fructosamine previously 316 ? ?Current management, blood sugar patterns and problems as of 4/23: ?He is back on his freestyle libre monitoring and checking blood sugars several times a day  ?However his blood sugars appear to be inconsistent especially overnight with some readings in the 60s but majority are high ?Also his lab glucose was about 30 mg lower than his approximate glucose on the sensor around the same time ?He thinks he is taking 40 units of Toujeo consistently but took 30 units this morning  ?Also without adjusting his mealtime dose based on what he is eating he is having very consistent readings the rest of the day ?Does not have any hypoglycemic symptoms when his libre reads in the 60s ?No significant weight change ?Not able to do any walking for exercise ? ?   Side effects from medications:  urgency, frequent urination from Invokana  ? ?Interpretation of his CGM download for the last 2 weeks as follows, using freestyle libre 2 ? ?His blood sugars show mild variability but are fairly consistently controlled at all times ?HYPERGLYCEMIC episodes occurred only sporadically midday and afternoon and only  once overnight ?HIGHEST blood sugars on an average are midday ?The lowest blood sugars are between 8-10 PM on average ?She does not have any consistent postprandial hyperglycemia ?Hypoglycemia not present although occasionally overnight blood sugars appear to be relatively low normal ? ?CGM use % of time 95  ?2-week average/GV 146/22  ?Time in range       86%  ?% Time Above 180 14  ?% Time above 250   ?% Time Below 70   ? ?  ?PRE-MEAL Fasting Lunch Dinner Bedtime Overall  ?Glucose range:       ?Averages: 148 160 135    ? ?POST-MEAL PC Breakfast PC Lunch PC Dinner  ?Glucose range:     ?Averages: 160 151 132  ? ?Previous data:  ? ?PRE-MEAL Fasting Lunch Dinner Bedtime Overall  ?Glucose range: 105-163   196   ?Mean/median:     173  ? ?POST-MEAL PC Breakfast PC Lunch PC Dinner  ?Glucose range:  169, 278   ?Mean/median:     ? ?       ?Meals: Breakfast variable, 9-10 AM Lunch 2-3pm  usually avoiding high-fat meals, dinner at about 6 pm   ?      ? ?Dietician visit: Most recent: 2013   ? ?Weight control: ? ?Wt Readings from Last 3 Encounters:  ?05/03/21 194 lb 12.8 oz (88.4 kg)  ?03/22/21 193 lb (87.5 kg)  ?  03/16/21 196 lb 3.2 oz (89 kg)  ?      ? ? ?Diabetes labs: ? ?Lab Results  ?Component Value Date  ? HGBA1C 7.3 (H) 04/29/2021  ? HGBA1C 8.1 (H) 03/22/2021  ? HGBA1C 8.6 (H) 12/20/2020  ? ?Lab Results  ?Component Value Date  ? MICROALBUR <0.7 04/29/2021  ? [place] 58 03/10/2021  ? CREATININE 1.41 04/29/2021  ? ?Lab Results  ?Component Value Date  ? FRUCTOSAMINE 316 (H) 03/10/2021  ? FRUCTOSAMINE 367 (H) 02/01/2021  ? FRUCTOSAMINE 280 09/11/2019  ? FRUCTOSAMINE 298 (H) 04/28/2019  ? FRUCTOSAMINE 276 09/13/2018  ? ? ?Other active problems discussed today: See review of systems ? ? ?Lab on 04/29/2021  ?Component Date Value Ref Range Status  ? Sodium 04/29/2021 139  135 - 145 mEq/L Final  ? Potassium 04/29/2021 3.9  3.5 - 5.1 mEq/L Final  ? Chloride 04/29/2021 99  96 - 112 mEq/L Final  ? CO2 04/29/2021 32  19 - 32 mEq/L Final   ? Glucose, Bld 04/29/2021 161 (H)  70 - 99 mg/dL Final  ? BUN 04/29/2021 20  6 - 23 mg/dL Final  ? Creatinine, Ser 04/29/2021 1.41  0.40 - 1.50 mg/dL Final  ? GFR 04/29/2021 53.04 (L)  >60.00 mL/min Final  ? Calculated using the CKD-EPI Creatinine Equation (2021)  ? Calcium 04/29/2021 9.2  8.4 - 10.5 mg/dL Final  ? Hgb A1c MFr Bld 04/29/2021 7.3 (H)  4.6 - 6.5 % Final  ? Glycemic Control Guidelines for People with Diabetes:Non Diabetic:  <6%Goal of Therapy: <7%Additional Action Suggested:  >8%   ? Microalb, Ur 04/29/2021 <0.7  0.0 - 1.9 mg/dL Final  ? Creatinine,U 04/29/2021 22.6  mg/dL Final  ? Microalb Creat Ratio 04/29/2021 3.1  0.0 - 30.0 mg/g Final  ? ? ? ?Allergies as of 05/03/2021   ? ?   Reactions  ? Benazepril Cough  ? Molds & Smuts   ? Hard to breathe  ? Pravastatin Other (See Comments)  ? myositis  ? Glipizide Nausea Only  ? ?  ? ?  ?Medication List  ?  ? ?  ? Accurate as of May 03, 2021  3:22 PM. If you have any questions, ask your nurse or doctor.  ?  ?  ? ?  ? ?Accu-Chek Guide test strip ?Generic drug: glucose blood ?TEST BLOOD SUGAR FOUR TIMES DAILY ?  ?albuterol 108 (90 Base) MCG/ACT inhaler ?Commonly known as: VENTOLIN HFA ?Inhale 2 puffs into the lungs every 6 (six) hours as needed for wheezing or shortness of breath. ?  ?aspirin EC 81 MG tablet ?Take 81 mg by mouth every morning. ?  ?B-D SINGLE USE SWABS REGULAR Pads ?USE AS DIRECTED ?  ?B-D ULTRAFINE III SHORT PEN 31G X 8 MM Misc ?Generic drug: Insulin Pen Needle ?USE TO INJECT INSULIN AS DIRECTED ?  ?Bevespi Aerosphere 9-4.8 MCG/ACT Aero ?Generic drug: Glycopyrrolate-Formoterol ?Inhale 2 puffs into the lungs 2 (two) times daily. ?  ?cetirizine 10 MG tablet ?Commonly known as: ZYRTEC ?Take 10 mg by mouth daily. ?  ?Cholecalciferol 50 MCG (2000 UT) Tabs ?Take 1 tablet (2,000 Units total) by mouth daily. ?  ?dapagliflozin propanediol 10 MG Tabs tablet ?Commonly known as: [place] ?Take 1 tablet (10 mg total) by mouth daily before breakfast. ?   ?DULoxetine 60 MG capsule ?Commonly known as: CYMBALTA ?TAKE 1 CAPSULE(60 MG) BY MOUTH DAILY ?  ?gabapentin 300 MG capsule ?Commonly known as: NEURONTIN ?TAKE 1 CAPSULE BY MOUTH THREE TIMES DAILY ?  ?  Glucagon Emergency 1 MG Kit ?  ?Gvoke HypoPen 2-Pack 1 MG/0.2ML Soaj ?Generic drug: Glucagon ?Inject 1 Act into the skin daily as needed. ?  ?HumaLOG KwikPen 100 UNIT/ML KwikPen ?Generic drug: insulin lispro ?ADMINISTER 25 TO 30 UNITS UNDER THE SKIN THREE TIMES DAILY ?What changed: See the new instructions. ?  ?Lancets Misc ?Use to test blood sugar tid. DX: E11.8 ?  ?Livalo 1 MG Tabs ?Generic drug: Pitavastatin Calcium ?Take 1 tablet (1 mg total) by mouth daily. ?  ?Nebivolol HCl 20 MG Tabs ?TAKE 1 TABLET(20 MG) BY MOUTH DAILY ?  ?Odefsey 200-25-25 MG Tabs tablet ?Generic drug: emtricitabine-rilpivir-tenofovir AF ?Take 1 tablet by mouth daily. ?  ?olmesartan 40 MG tablet ?Commonly known as: BENICAR ?TAKE 1 TABLET(40 MG) BY MOUTH DAILY ?  ?Ozempic (0.25 or 0.5 MG/DOSE) 2 MG/1.5ML Sopn ?Generic drug: Semaglutide(0.25 or 0.5MG/DOS) ?INJECT 0.25MG UNDER THE SKIN WEEKLY FOR 4 WEEKS, THEN INJECT 0.5MG UNDER THE SKIN ONCE A WEEK ?  ?pioglitazone 30 MG tablet ?Commonly known as: ACTOS ?TAKE 1 TABLET(30 MG) BY MOUTH DAILY ?  ?torsemide 20 MG tablet ?Commonly known as: DEMADEX ?TAKE 1 TABLET(20 MG) BY MOUTH DAILY ?What changed: See the new instructions. ?  ?Toujeo SoloStar 300 UNIT/ML Solostar Pen ?Generic drug: insulin glargine (1 Unit Dial) ?Inject 80 Units into the skin daily. ?What changed: how much to take ?  ?traZODone 150 MG tablet ?Commonly known as: DESYREL ?TAKE 3 TABLETS(450 MG) BY MOUTH AT BEDTIME ?  ?zolpidem 6.25 MG CR tablet ?Commonly known as: AMBIEN CR ?Take 1 tablet (6.25 mg total) by mouth at bedtime as needed for sleep. ?  ? ?  ? ? ?Allergies:  ?Allergies  ?Allergen Reactions  ? Benazepril Cough  ? Molds & Smuts   ?  Hard to breathe  ? Pravastatin Other (See Comments)  ?  myositis  ? Glipizide Nausea Only   ? ? ?Past Medical History:  ?Diagnosis Date  ? Anxiety   ? Cataract   ? right   ? Depression   ? Diabetes mellitus without complication ([place])   ? DM (diabetes mellitus screen)   ? HIV disease ([place]) 09/12/1995

## 2021-05-03 NOTE — Patient Instructions (Addendum)
Toujeo 44 units 1x daily ? ?Check with supplier re: Dexcom ? ?

## 2021-05-03 NOTE — Telephone Encounter (Signed)
Pt requesting a rx for belsomra as an alternative  ? ?[facility], [place] ?

## 2021-05-05 ENCOUNTER — Other Ambulatory Visit: Payer: Self-pay | Admitting: Internal Medicine

## 2021-05-05 DIAGNOSIS — F409 Phobic anxiety disorder, unspecified: Secondary | ICD-10-CM

## 2021-05-05 MED ORDER — BELSOMRA 20 MG PO TABS: 20.0000 mg | ORAL_TABLET | Freq: Every evening | ORAL | 1 refills | Status: DC | PRN

## 2021-05-05 NOTE — Telephone Encounter (Signed)
Pt called in requesting rx for Belsomra. ? ?Please send to  ?[id number] [address], [place] Phone:  [phone]  ?Fax:  [phone]  ?  ? ?

## 2021-05-06 ENCOUNTER — Other Ambulatory Visit: Payer: Self-pay | Admitting: Endocrinology

## 2021-05-06 ENCOUNTER — Other Ambulatory Visit: Payer: Self-pay

## 2021-05-06 ENCOUNTER — Encounter: Payer: Self-pay | Admitting: Endocrinology

## 2021-05-06 DIAGNOSIS — E1140 Type 2 diabetes mellitus with diabetic neuropathy, unspecified: Secondary | ICD-10-CM

## 2021-05-06 DIAGNOSIS — E1165 Type 2 diabetes mellitus with hyperglycemia: Secondary | ICD-10-CM

## 2021-05-06 MED ORDER — TOUJEO SOLOSTAR 300 UNIT/ML ~~LOC~~ SOPN: PEN_INJECTOR | SUBCUTANEOUS | 3 refills | Status: DC

## 2021-05-06 MED ORDER — HUMALOG KWIKPEN 100 UNIT/ML ~~LOC~~ SOPN: PEN_INJECTOR | SUBCUTANEOUS | 3 refills | Status: DC

## 2021-05-08 DIAGNOSIS — Z7984 Long term (current) use of oral hypoglycemic drugs: Secondary | ICD-10-CM

## 2021-05-08 DIAGNOSIS — E785 Hyperlipidemia, unspecified: Secondary | ICD-10-CM | POA: Diagnosis not present

## 2021-05-08 DIAGNOSIS — Z794 Long term (current) use of insulin: Secondary | ICD-10-CM

## 2021-05-08 DIAGNOSIS — F17210 Nicotine dependence, cigarettes, uncomplicated: Secondary | ICD-10-CM | POA: Diagnosis not present

## 2021-05-08 DIAGNOSIS — I10 Essential (primary) hypertension: Secondary | ICD-10-CM

## 2021-05-08 DIAGNOSIS — E1159 Type 2 diabetes mellitus with other circulatory complications: Secondary | ICD-10-CM | POA: Diagnosis not present

## 2021-05-26 ENCOUNTER — Other Ambulatory Visit: Payer: Self-pay

## 2021-05-26 DIAGNOSIS — E1165 Type 2 diabetes mellitus with hyperglycemia: Secondary | ICD-10-CM

## 2021-05-26 MED ORDER — OZEMPIC (0.25 OR 0.5 MG/DOSE) 2 MG/1.5ML ~~LOC~~ SOPN: PEN_INJECTOR | SUBCUTANEOUS | 5 refills | Status: DC

## 2021-05-27 ENCOUNTER — Telehealth: Payer: Self-pay

## 2021-05-27 NOTE — Chronic Care Management (AMB) (Signed)
Chronic Care Management Pharmacy Assistant   Name: Tobby Fawcett  MRN: [id number] DOB: [birth year]    Reason for Encounter: Disease State-General    Recent office visits:  None since the last coordination call 04/28/21 with clinical pharmacist  Recent consult visits:  05/03/21 Elayne Snare, MD-Endocrinology (Uncontrolled Diabetes) Blood work ordered: BMP and A1c- medication changes:Toujeo to 44 units every morning If his morning sugars are still over 140 he will go up to 46 or 48 units units Advised him to cut back on the Humalog to 25 units if eating smaller meals  Hospital visits:  None since the last coordination call 04/28/21 with clinical pharmacist  Medications: Outpatient Encounter Medications as of 05/27/2021  Medication Sig   albuterol (VENTOLIN HFA) 108 (90 Base) MCG/ACT inhaler Inhale 2 puffs into the lungs every 6 (six) hours as needed for wheezing or shortness of breath.   Alcohol Swabs (B-D SINGLE USE SWABS REGULAR) PADS USE AS DIRECTED   aspirin EC 81 MG tablet Take 81 mg by mouth every morning.   cetirizine (ZYRTEC) 10 MG tablet Take 10 mg by mouth daily.   Cholecalciferol 2000 units TABS Take 1 tablet (2,000 Units total) by mouth daily.   dapagliflozin propanediol (FARXIGA) 10 MG TABS tablet Take 1 tablet (10 mg total) by mouth daily before breakfast.   DULoxetine (CYMBALTA) 60 MG capsule TAKE 1 CAPSULE(60 MG) BY MOUTH DAILY   emtricitabine-rilpivir-tenofovir AF (ODEFSEY) 200-25-25 MG TABS tablet Take 1 tablet by mouth daily.   gabapentin (NEURONTIN) 300 MG capsule TAKE 1 CAPSULE BY MOUTH THREE TIMES DAILY   Glucagon (GVOKE HYPOPEN 2-PACK) 1 MG/0.2ML SOAJ Inject 1 Act into the skin daily as needed.   Glucagon, rDNA, (GLUCAGON EMERGENCY) 1 MG KIT  (Patient not taking: Reported on 05/03/2021)   glucose blood (ACCU-CHEK GUIDE) test strip TEST BLOOD SUGAR FOUR TIMES DAILY   Glycopyrrolate-Formoterol (BEVESPI AEROSPHERE) 9-4.8 MCG/ACT AERO Inhale 2 puffs into the lungs 2  (two) times daily. (Patient not taking: Reported on 05/03/2021)   HUMALOG KWIKPEN 100 UNIT/ML KwikPen ADMINISTER 25 TO 30 UNITS UNDER THE SKIN THREE TIMES DAILY   insulin glargine, 1 Unit Dial, (TOUJEO SOLOSTAR) 300 UNIT/ML Solostar Pen Inject 44 units daily   Insulin Pen Needle (B-D ULTRAFINE III SHORT PEN) 31G X 8 MM MISC USE TO INJECT INSULIN AS DIRECTED   Lancets MISC Use to test blood sugar tid. DX: E11.8   Nebivolol HCl 20 MG TABS TAKE 1 TABLET(20 MG) BY MOUTH DAILY   olmesartan (BENICAR) 40 MG tablet TAKE 1 TABLET(40 MG) BY MOUTH DAILY   pioglitazone (ACTOS) 30 MG tablet TAKE 1 TABLET(30 MG) BY MOUTH DAILY   Pitavastatin Calcium (LIVALO) 1 MG TABS Take 1 tablet (1 mg total) by mouth daily.   Semaglutide,0.25 or 0.5MG/DOS, (OZEMPIC, 0.25 OR 0.5 MG/DOSE,) 2 MG/1.5ML SOPN INJECT 0.25MG UNDER THE SKIN WEEKLY FOR 4 WEEKS, THEN INJECT 0.5MG UNDER THE SKIN ONCE A WEEK   Suvorexant (BELSOMRA) 20 MG TABS Take 20 mg by mouth at bedtime as needed.   torsemide (DEMADEX) 20 MG tablet TAKE 1 TABLET(20 MG) BY MOUTH DAILY (Patient taking differently: Take 20 mg by mouth every Monday, Wednesday, and Friday.)   traZODone (DESYREL) 150 MG tablet TAKE 3 TABLETS(450 MG) BY MOUTH AT BEDTIME   No facility-administered encounter medications on file as of 05/27/2021.    Contacted Debbe Bales for General Review Call   Chart Review:  Have there been any documented new, changed, or discontinued medications since last visit? Yes (  If yes, include name, dose, frequency, date)05/03/21 medication changes:Toujeo to 44 units every morning. If his morning sugars are still over 140 he will go up to 46 or 48 units units Advised him to cut back on the Humalog to 25 units if eating smaller meals Has there been any documented recent hospitalizations or ED visits since last visit with Clinical Pharmacist? No Brief Summary (including medication and/or Diagnosis changes):   Adherence Review:  Does the Clinical Pharmacist  Assistant have access to adherence rates? Yes Adherence rates for STAR metric medications (List medication(s)/day supply/ last 2 fill dates). Adherence rates for medications indicated for disease state being reviewed (List medication(s)/day supply/ last 2 fill dates). Does the patient have >5 day gap between last estimated fill dates for any of the above medications or other medication gaps? No Reason for medication gaps.   Disease State Questions:  Able to connect with Patient? Yes  Did patient have any problems with their health recently? Yes Note problems and Concerns:Patient states that he is having trouble still sleeping even though he gets about six hours of sleep he keeps waking up during the night. He states that he is taking belsomra.  Have you had any admissions or emergency room visits or worsening of your condition(s) since last visit? No Details of ED visit, hospital visit and/or worsening condition(s):  Have you had any visits with new specialists or providers since your last visit? Yes Explain:05/03/21 Elayne Snare, MD-Endocrinology (Uncontrolled Diabetes)   Have you had any new health care problem(s) since your last visit? Yes New problem(s) reported:Patient states that he is having some issues that he needs to talk with Dr. [name] about. He states that he sees Dr. [name] in September but needs an earlier appt because of he issues he is having. Patient did not discuss the issues  Have you run out of any of your medications since you last spoke with clinical pharmacist? No, patient states that he is having problems with the pharmacy having issues with verifying the directions on Ozempic pen  Are there any medications you are not taking as prescribed? Yes, patient states that he has to adjust it to be able to take   Are you having any issues or side effects with your medications? No Note of issues or side effects:  Do you have any other health concerns or questions you want  to discuss with your Clinical Pharmacist before your next visit? No Note additional concerns and questions from Patient.  Are there any health concerns that you feel we can do a better job addressing? No Note Patient's response.  Are you having any problems with any of the following since the last visit: (select all that apply)  Sleeping  Details:  12. Any falls since last visit? No  Details:  13. Any increased or uncontrolled pain since last visit? No  Details:  14. Next visit Type: telephone       Visit with:clinical pharmacist         Date:10/27/21        Time:10 am  15. Additional Details? No    Care Gaps: Colonoscopy - 04/24/2012 Diabetic Foot Exam - 12/20/20 Ophthalmology - 07/20/20 Dexa Scan - NA Annual Well Visit - 01/21/2021 Micro albumin - 04/29/21 Hemoglobin A1c -04/29/21   Star Rating Drugs: Olmesartan-last fill 04/04/21 90 ds                  Ozempic-last fill 05/23/21 56 ds Pitavastatin-last fill 05/16/21 90 ds  Pioglitazone 30 mg-last fill 05/12/21 90 ds   Ethelene Hal Clinical Pharmacist Assistant [phone]

## 2021-06-01 ENCOUNTER — Encounter: Payer: Self-pay | Admitting: Podiatry

## 2021-06-01 ENCOUNTER — Ambulatory Visit (INDEPENDENT_AMBULATORY_CARE_PROVIDER_SITE_OTHER): Payer: 59 | Admitting: Podiatry

## 2021-06-01 DIAGNOSIS — E1140 Type 2 diabetes mellitus with diabetic neuropathy, unspecified: Secondary | ICD-10-CM

## 2021-06-01 DIAGNOSIS — M79675 Pain in left toe(s): Secondary | ICD-10-CM

## 2021-06-01 DIAGNOSIS — B351 Tinea unguium: Secondary | ICD-10-CM

## 2021-06-01 DIAGNOSIS — Z794 Long term (current) use of insulin: Secondary | ICD-10-CM

## 2021-06-01 DIAGNOSIS — L84 Corns and callosities: Secondary | ICD-10-CM | POA: Diagnosis not present

## 2021-06-01 DIAGNOSIS — M79674 Pain in right toe(s): Secondary | ICD-10-CM

## 2021-06-02 ENCOUNTER — Telehealth: Payer: Self-pay | Admitting: Endocrinology

## 2021-06-02 NOTE — Telephone Encounter (Signed)
Patient came in a picked up Ozempic sample -

## 2021-06-06 NOTE — Progress Notes (Signed)
  Subjective:  Patient ID: Nicholas Whitehead, male    DOB: Feb 07, 1955,  MRN: [id number]  Nicholas Whitehead presents to clinic today for at risk foot care with history of diabetic neuropathy, painful elongated mycotic toenails 1-5 bilaterally which are tender when wearing enclosed shoe gear. Pain is relieved with periodic professional debridement and preulcerative calluses b/l heels.   Patient states blood glucose was 196 mg/dl today.  Last known HgA1c was 7.2%.  Today, patient presents with lesion on the tip of his left great toe. He feels no pain and has not noticed any drainage in his socks/shoes or on his bedsheets.        PCP is [name], MD , and last visit was March 22, 2021.  Allergies  Allergen Reactions   Benazepril Cough   Molds & Smuts     Hard to breathe   Pravastatin Other (See Comments)    myositis   Glipizide Nausea Only    Review of Systems: Negative except as noted in the HPI.  Objective: No changes noted in today's physical examination. Constitutional Nicholas Whitehead is a pleasant 66 y.o. African American male, in NAD. AAO x 3.   Vascular CFT immediate b/l LE. Palpable DP/PT pulses b/l LE. Digital hair sparse b/l. Skin temperature gradient WNL b/l. No pain with calf compression b/l. No edema noted b/l. No cyanosis or clubbing noted b/l LE.  Neurologic Normal speech. Oriented to person, place, and time. Protective sensation diminished with 10g monofilament b/l. Vibratory sensation intact b/l.  Dermatologic Pedal integument with normal turgor, texture and tone b/l LE. No open wounds b/l. No interdigital macerations b/l. Toenails 1-5 b/l elongated, thickened, discolored with subungual debris. +Tenderness with dorsal palpation of nailplates. Preulcerative lesion noted distal tip of left hallux and plantar heel pad of both feet. There is visible subdermal hemorrhage. There is no surrounding erythema, no edema, no drainage, no odor, no fluctuance. Postdebridement  photos:        Orthopedic: Muscle strength 5/5 to all lower extremity muscle groups bilaterally. Hammertoe(s) noted to the 2-5 bilaterally and L hallux.   Radiographs: None    Latest Ref Rng & Units 04/29/2021    9:46 AM 03/22/2021   12:20 PM 12/20/2020   10:10 AM 06/10/2020    9:24 AM  Hemoglobin A1C  Hemoglobin-A1c 4.6 - 6.5 % 7.3   8.1   8.6   6.7     Assessment/Plan: 1. Pain due to onychomycosis of toenails of both feet   2. Pre-ulcerative calluses   3. Type 2 diabetes mellitus with diabetic neuropathy, with long-term current use of insulin ([place])      -Examined patient. -Continue diabetic foot care principles: inspect feet daily, monitor glucose as recommended by PCP and/or Endocrinologist, and follow prescribed diet per PCP, Endocrinologist and/or dietician. -Patient to continue soft, supportive shoe gear daily. -Toenails 1-5 b/l were debrided in length and girth with sterile nail nippers and dremel without iatrogenic bleeding.  -Preulcerative lesion pared distal tip of left hallux and plantar heel pad of both feet. Total number pared=3. -Patient/POA to call should there be question/concern in the interim.   Return in about 4 weeks (around 06/29/2021).  Marzetta Board, DPM

## 2021-06-13 ENCOUNTER — Other Ambulatory Visit (INDEPENDENT_AMBULATORY_CARE_PROVIDER_SITE_OTHER): Payer: 59

## 2021-06-13 ENCOUNTER — Telehealth: Payer: Self-pay

## 2021-06-13 DIAGNOSIS — E1165 Type 2 diabetes mellitus with hyperglycemia: Secondary | ICD-10-CM

## 2021-06-13 DIAGNOSIS — Z794 Long term (current) use of insulin: Secondary | ICD-10-CM | POA: Diagnosis not present

## 2021-06-13 LAB — BASIC METABOLIC PANEL
BUN: 14 mg/dL (ref 6–23)
CO2: 28 mEq/L (ref 19–32)
Calcium: 9.5 mg/dL (ref 8.4–10.5)
Chloride: 105 mEq/L (ref 96–112)
Creatinine, Ser: 1.31 mg/dL (ref 0.40–1.50)
GFR: 57.89 mL/min — ABNORMAL LOW (ref >60.00)
Glucose, Bld: 165 mg/dL — ABNORMAL HIGH (ref 70–99)
Potassium: 4.2 mEq/L (ref 3.5–5.1)
Sodium: 138 mEq/L (ref 135–145)

## 2021-06-13 LAB — HEMOGLOBIN A1C: Hgb A1c MFr Bld: 7.4 % — ABNORMAL HIGH (ref 4.6–6.5)

## 2021-06-13 NOTE — Telephone Encounter (Signed)
[employer] sent in for last office visit for dexcom. Sent office note for patient. Tried contacting patient to let him know but no answer.

## 2021-06-16 ENCOUNTER — Encounter: Payer: 59 | Admitting: Endocrinology

## 2021-06-16 ENCOUNTER — Encounter: Payer: Self-pay | Admitting: Endocrinology

## 2021-06-17 NOTE — Progress Notes (Signed)
This encounter was created in error - please disregard.

## 2021-06-29 ENCOUNTER — Ambulatory Visit (INDEPENDENT_AMBULATORY_CARE_PROVIDER_SITE_OTHER): Payer: 59 | Admitting: Podiatry

## 2021-06-29 ENCOUNTER — Ambulatory Visit: Payer: 59 | Admitting: Podiatry

## 2021-06-29 ENCOUNTER — Other Ambulatory Visit: Payer: Self-pay | Admitting: Internal Medicine

## 2021-06-29 DIAGNOSIS — L84 Corns and callosities: Secondary | ICD-10-CM

## 2021-06-29 DIAGNOSIS — E1140 Type 2 diabetes mellitus with diabetic neuropathy, unspecified: Secondary | ICD-10-CM

## 2021-06-29 DIAGNOSIS — Z794 Long term (current) use of insulin: Secondary | ICD-10-CM | POA: Diagnosis not present

## 2021-06-29 DIAGNOSIS — M2032 Hallux varus (acquired), left foot: Secondary | ICD-10-CM

## 2021-07-06 ENCOUNTER — Encounter: Payer: Self-pay | Admitting: Podiatry

## 2021-07-06 NOTE — Progress Notes (Signed)
Subjective:  Patient ID: Nicholas Whitehead, male    DOB: 10/01/1955,  MRN: [id number]  66 y.o. male is seen today for follow up evaluation of preulcerative lesion of left great toe. Patient relates toe has improved. He has been wearing toe cap as instructed.  Patient The patient is having no constitutional symptoms, denying fever, chills, anorexia, or weight loss.  Current Outpatient Medications on File Prior to Visit  Medication Sig Dispense Refill   albuterol (VENTOLIN HFA) 108 (90 Base) MCG/ACT inhaler Inhale 2 puffs into the lungs every 6 (six) hours as needed for wheezing or shortness of breath. 1 each 5   Alcohol Swabs (B-D SINGLE USE SWABS REGULAR) PADS USE AS DIRECTED 300 each 1   aspirin EC 81 MG tablet Take 81 mg by mouth every morning.     cetirizine (ZYRTEC) 10 MG tablet Take 10 mg by mouth daily.     Cholecalciferol 2000 units TABS Take 1 tablet (2,000 Units total) by mouth daily. 90 tablet 1   DULoxetine (CYMBALTA) 60 MG capsule TAKE 1 CAPSULE(60 MG) BY MOUTH DAILY 90 capsule 1   emtricitabine-rilpivir-tenofovir AF (ODEFSEY) 200-25-25 MG TABS tablet Take 1 tablet by mouth daily. 90 tablet 3   gabapentin (NEURONTIN) 300 MG capsule TAKE 1 CAPSULE BY MOUTH THREE TIMES DAILY 270 capsule 1   Glucagon (GVOKE HYPOPEN 2-PACK) 1 MG/0.2ML SOAJ Inject 1 Act into the skin daily as needed. 2 mL 6   Glucagon, rDNA, (GLUCAGON EMERGENCY) 1 MG KIT  (Patient not taking: Reported on 05/03/2021)     glucose blood (ACCU-CHEK GUIDE) test strip TEST BLOOD SUGAR FOUR TIMES DAILY 400 strip 3   Glycopyrrolate-Formoterol (BEVESPI AEROSPHERE) 9-4.8 MCG/ACT AERO Inhale 2 puffs into the lungs 2 (two) times daily. (Patient not taking: Reported on 05/03/2021) 32.1 g 1   HUMALOG KWIKPEN 100 UNIT/ML KwikPen ADMINISTER 25 TO 30 UNITS UNDER THE SKIN THREE TIMES DAILY 30 mL 3   insulin glargine, 1 Unit Dial, (TOUJEO SOLOSTAR) 300 UNIT/ML Solostar Pen Inject 44 units daily 6 mL 3   Insulin Pen Needle (B-D ULTRAFINE III  SHORT PEN) 31G X 8 MM MISC USE TO INJECT INSULIN AS DIRECTED 500 each 1   Lancets MISC Use to test blood sugar tid. DX: E11.8 300 each 3   Nebivolol HCl 20 MG TABS TAKE 1 TABLET(20 MG) BY MOUTH DAILY 90 tablet 1   olmesartan (BENICAR) 40 MG tablet TAKE 1 TABLET(40 MG) BY MOUTH DAILY 90 tablet 1   pioglitazone (ACTOS) 30 MG tablet TAKE 1 TABLET(30 MG) BY MOUTH DAILY 90 tablet 1   Pitavastatin Calcium (LIVALO) 1 MG TABS Take 1 tablet (1 mg total) by mouth daily. 90 tablet 1   Semaglutide,0.25 or 0.5MG/DOS, (OZEMPIC, 0.25 OR 0.5 MG/DOSE,) 2 MG/1.5ML SOPN INJECT 0.25MG UNDER THE SKIN WEEKLY FOR 4 WEEKS, THEN INJECT 0.5MG UNDER THE SKIN ONCE A WEEK 1.5 mL 5   Suvorexant (BELSOMRA) 20 MG TABS Take 20 mg by mouth at bedtime as needed. 90 tablet 1   torsemide (DEMADEX) 20 MG tablet TAKE 1 TABLET(20 MG) BY MOUTH DAILY (Patient taking differently: Take 20 mg by mouth every Monday, Wednesday, and Friday.) 90 tablet 1   traZODone (DESYREL) 150 MG tablet TAKE 3 TABLETS(450 MG) BY MOUTH AT BEDTIME 270 tablet 1   No current facility-administered medications on file prior to visit.     Allergies  Allergen Reactions   Benazepril Cough   Molds & Smuts     Hard to breathe   Pravastatin Other (  See Comments)    myositis   Glipizide Nausea Only     Objective:  Physical Exam: There were no vitals filed for this visit.   Nasser Ku is a pleasant 66 y.o. African American male WD, WN in NAD. AAO x 3.  Constitutional Ceejay Kegley is a pleasant 66 y.o. African American male, in NAD. AAO x 3.   Vascular CFT immediate b/l LE. Palpable DP/PT pulses b/l LE. Digital hair sparse b/l. Skin temperature gradient WNL b/l. No pain with calf compression b/l. No edema noted b/l. No cyanosis or clubbing noted b/l LE.  Neurologic Normal speech. Oriented to person, place, and time. Protective sensation diminished with 10g monofilament b/l. Vibratory sensation intact b/l.  Dermatologic Pedal integument with normal turgor,  texture and tone b/l LE. No open wounds b/l. No interdigital macerations b/l. Toenails 1-5 b/l recently debrided.  Preulcerative lesion noted distal tip of left hallux recently pared. There is visible subdermal hemorrhage. There is no surrounding erythema, no edema, no drainage, no odor, no fluctuance. Lesion distal tip left great toe remains closed.  Orthopedic: Muscle strength 5/5 to all lower extremity muscle groups bilaterally. Hammertoe(s) noted to the 2-5 bilaterally and L hallux.   Radiographs: None  Labs:    Latest Ref Rng & Units 06/13/2021    9:01 AM 04/29/2021    9:46 AM 03/22/2021   12:20 PM 12/20/2020   10:10 AM  Hemoglobin A1C  Hemoglobin-A1c 4.6 - 6.5 % 7.4  7.3  8.1  8.6    Assessment:   Pre-ulcerative calluses  Hallux malleus, left  Type 2 diabetes mellitus with diabetic neuropathy, with long-term current use of insulin ([place])  Plan:  -Patient was evaluated and treated and all questions answered.  - Left hallux remains closed. -Patient referred to Dr. Lanae Crumbly for evaluation of left hallux due to threatening wound formation. He will have xrays taken at that visit and discuss his options. -Continue toe cap to afftected digits daily for protection. -Patient/POA to call should there be question/concern in the interim. Return in about 3 months (around 09/29/2021). Marzetta Board, DPM

## 2021-07-19 ENCOUNTER — Ambulatory Visit (INDEPENDENT_AMBULATORY_CARE_PROVIDER_SITE_OTHER): Payer: 59 | Admitting: Endocrinology

## 2021-07-19 ENCOUNTER — Encounter: Payer: Self-pay | Admitting: Endocrinology

## 2021-07-19 VITALS — BP 112/64 | HR 60 | Ht 73.0 in | Wt 191.4 lb

## 2021-07-19 DIAGNOSIS — I10 Essential (primary) hypertension: Secondary | ICD-10-CM

## 2021-07-19 DIAGNOSIS — Z794 Long term (current) use of insulin: Secondary | ICD-10-CM

## 2021-07-19 DIAGNOSIS — E1165 Type 2 diabetes mellitus with hyperglycemia: Secondary | ICD-10-CM

## 2021-07-19 MED ORDER — OZEMPIC (1 MG/DOSE) 4 MG/3ML ~~LOC~~ SOPN: PEN_INJECTOR | SUBCUTANEOUS | 5 refills | Status: DC

## 2021-07-19 NOTE — Progress Notes (Signed)
Patient ID: Kassim Guertin, male   DOB: 1955/03/18, 66 y.o.   MRN: [id number]           Patient ID: Omri Bertran, male   DOB: 07/16/1955, 66 y.o.   MRN: [id number]   Reason for Appointment: Type II Diabetes follow-up   History of Present Illness   Diagnosis date: 1998    Previous history: He was started on oral agents at diagnosis and around 2006 was put on insulin His blood sugars are usually well controlled when he takes Actos along with his insulin. He had been on Invokana since 04/2013  He requires large doses of insulin especially basal insulin   Recent history:   Insulin regimen: Toujeo: 44 units am;  Novolog 30 units before meals         Oral hypoglycemic drugs: Metformin ER 1000 mg twice a day, Actos 30 mg, Ozempic 0.5 weekly, Farxiga 34m  A1c is 7.4 and stable compared to 8.6  Fructosamine previously 316  Current management, blood sugar patterns and problems as of 4/23: He is having some improvement in his blood sugars overall but his recent time in range is not as good at 61 compared to 86% Despite increasing his Toujeo by 4 units his blood sugars are still higher overnight but not consistently after meals Previously was having low normal readings at times sporadically Again not clear how well he is doing on the diet and he does not think that Ozempic has changed his satiety, no nausea with this Usually consistent with trying to take his NovoLog before starting to eat and Toujeo once a day No significant weight change He is trying to do some walking for exercise  Dinner 8-9 pm    Side effects from medications:  urgency, frequent urination from Invokana   Interpretation of his CGM download for the last 2 weeks as follows, using freestyle libre 2  His blood sugars s overall are mildly increased at the upper end of the target range except overall somewhat lower early morning  HYPERGLYCEMIC episodes are occurring sporadically after lunch or mid afternoon but rarely  late in the evenings or overnight Blood sugars were much higher overall on 7/5 and 7/6  On an average postprandial readings are not much higher than Premeal readings and highest around 4-6 PM OVERNIGHT blood sugars are mostly above target and generally averaging 160 or more, highest early part of the night Hypoglycemia not present at any time   CGM use % of time 70  2-week average/GV 175  Time in range      61  %  % Time Above 180 34  % Time above 250 5  % Time Below 70      PRE-MEAL Fasting Lunch Dinner Bedtime Overall  Glucose range:       Averages: 163 164 167     POST-MEAL PC Breakfast PC Lunch PC Dinner  Glucose range:     Averages:  194 175   Previously:  CGM use % of time 95  2-week average/GV 146/22  Time in range       86%  % Time Above 180 14  % Time above 250   % Time Below 70      PRE-MEAL Fasting Lunch Dinner Bedtime Overall  Glucose range:       Averages: 148 160 135     POST-MEAL PC Breakfast PC Lunch PC Dinner  Glucose range:     Averages: 160 151 132  Meals: Breakfast variable, 9-10 AM Lunch 2-3pm  usually avoiding high-fat meals, dinner at about 6 pm          Dietician visit: Most recent: 2013    Weight control:  Wt Readings from Last 3 Encounters:  07/19/21 191 lb 6.4 oz (86.8 kg)  06/16/21 190 lb 9.6 oz (86.5 kg)  05/03/21 194 lb 12.8 oz (88.4 kg)          Diabetes labs:  Lab Results  Component Value Date   HGBA1C 7.4 (H) 06/13/2021   HGBA1C 7.3 (H) 04/29/2021   HGBA1C 8.1 (H) 03/22/2021   Lab Results  Component Value Date   MICROALBUR <0.7 04/29/2021   LDLCALC 58 03/10/2021   CREATININE 1.31 06/13/2021   Lab Results  Component Value Date   FRUCTOSAMINE 316 (H) 03/10/2021   FRUCTOSAMINE 367 (H) 02/01/2021   FRUCTOSAMINE 280 09/11/2019   FRUCTOSAMINE 298 (H) 04/28/2019   FRUCTOSAMINE 276 09/13/2018    Other active problems discussed today: See review of systems   No visits with results within 1 Week(s) from  this visit.  Latest known visit with results is:  Lab on 06/13/2021  Component Date Value Ref Range Status   Sodium 06/13/2021 138  135 - 145 mEq/L Final   Potassium 06/13/2021 4.2  3.5 - 5.1 mEq/L Final   Chloride 06/13/2021 105  96 - 112 mEq/L Final   CO2 06/13/2021 28  19 - 32 mEq/L Final   Glucose, Bld 06/13/2021 165 (H)  70 - 99 mg/dL Final   BUN 06/13/2021 14  6 - 23 mg/dL Final   Creatinine, Ser 06/13/2021 1.31  0.40 - 1.50 mg/dL Final   GFR 06/13/2021 57.89 (L)  >60.00 mL/min Final   Calculated using the CKD-EPI Creatinine Equation (2021)   Calcium 06/13/2021 9.5  8.4 - 10.5 mg/dL Final   Hgb A1c MFr Bld 06/13/2021 7.4 (H)  4.6 - 6.5 % Final   Glycemic Control Guidelines for People with Diabetes:Non Diabetic:  <6%Goal of Therapy: <7%Additional Action Suggested:  >8%      Allergies as of 07/19/2021       Reactions   Benazepril Cough   Molds & Smuts    Hard to breathe   Pravastatin Other (See Comments)   myositis   Glipizide Nausea Only        Medication List        Accurate as of July 19, 2021 12:08 PM. If you have any questions, ask your nurse or doctor.          Accu-Chek Guide test strip Generic drug: glucose blood TEST BLOOD SUGAR FOUR TIMES DAILY   albuterol 108 (90 Base) MCG/ACT inhaler Commonly known as: VENTOLIN HFA Inhale 2 puffs into the lungs every 6 (six) hours as needed for wheezing or shortness of breath.   aspirin EC 81 MG tablet Take 81 mg by mouth every morning.   B-D SINGLE USE SWABS REGULAR Pads USE AS DIRECTED   B-D ULTRAFINE III SHORT PEN 31G X 8 MM Misc Generic drug: Insulin Pen Needle USE TO INJECT INSULIN AS DIRECTED   Belsomra 20 MG Tabs Generic drug: Suvorexant Take 20 mg by mouth at bedtime as needed.   Bevespi Aerosphere 9-4.8 MCG/ACT Aero Generic drug: Glycopyrrolate-Formoterol Inhale 2 puffs into the lungs 2 (two) times daily.   cetirizine 10 MG tablet Commonly known as: ZYRTEC Take 10 mg by mouth daily.    Cholecalciferol 50 MCG (2000 UT) Tabs Take 1 tablet (2,000 Units total) by  mouth daily.   DULoxetine 60 MG capsule Commonly known as: CYMBALTA TAKE 1 CAPSULE(60 MG) BY MOUTH DAILY   Farxiga 10 MG Tabs tablet Generic drug: dapagliflozin propanediol TAKE 1 TABLET(10 MG) BY MOUTH DAILY BEFORE AND BREAKFAST   gabapentin 300 MG capsule Commonly known as: NEURONTIN TAKE 1 CAPSULE BY MOUTH THREE TIMES DAILY   Glucagon Emergency 1 MG Kit   Gvoke HypoPen 2-Pack 1 MG/0.2ML Soaj Generic drug: Glucagon Inject 1 Act into the skin daily as needed.   HumaLOG KwikPen 100 UNIT/ML KwikPen Generic drug: insulin lispro ADMINISTER 25 TO 30 UNITS UNDER THE SKIN THREE TIMES DAILY   Lancets Misc Use to test blood sugar tid. DX: E11.8   Livalo 1 MG Tabs Generic drug: Pitavastatin Calcium Take 1 tablet (1 mg total) by mouth daily.   Nebivolol HCl 20 MG Tabs TAKE 1 TABLET(20 MG) BY MOUTH DAILY   Odefsey 200-25-25 MG Tabs tablet Generic drug: emtricitabine-rilpivir-tenofovir AF Take 1 tablet by mouth daily.   olmesartan 40 MG tablet Commonly known as: BENICAR TAKE 1 TABLET(40 MG) BY MOUTH DAILY   Ozempic (0.25 or 0.5 MG/DOSE) 2 MG/1.5ML Sopn Generic drug: Semaglutide(0.25 or 0.5MG/DOS) INJECT 0.25MG UNDER THE SKIN WEEKLY FOR 4 WEEKS, THEN INJECT 0.5MG UNDER THE SKIN ONCE A WEEK What changed: Another medication with the same name was removed. Continue taking this medication, and follow the directions you see here. Changed by: Elayne Snare, MD   pioglitazone 30 MG tablet Commonly known as: ACTOS TAKE 1 TABLET(30 MG) BY MOUTH DAILY   torsemide 20 MG tablet Commonly known as: DEMADEX TAKE 1 TABLET(20 MG) BY MOUTH DAILY What changed: See the new instructions.   Toujeo SoloStar 300 UNIT/ML Solostar Pen Generic drug: insulin glargine (1 Unit Dial) Inject 44 units daily   traZODone 150 MG tablet Commonly known as: DESYREL TAKE 3 TABLETS(450 MG) BY MOUTH AT BEDTIME        Allergies:   Allergies  Allergen Reactions   Benazepril Cough   Molds & Smuts     Hard to breathe   Pravastatin Other (See Comments)    myositis   Glipizide Nausea Only    Past Medical History:  Diagnosis Date   Anxiety    Cataract    right    Depression    Diabetes mellitus without complication ([place])    DM (diabetes mellitus screen)    HIV disease ([place]) 09/12/1995   HIV infection ([place])    Hypercholesterolemia    Hyperlipidemia    Hypertension     Past Surgical History:  Procedure Laterality Date   COLONOSCOPY WITH PROPOFOL  01/16/2012   Procedure: COLONOSCOPY WITH PROPOFOL;  Surgeon: Garlan Fair, MD;  Location: [facility];  Service: Endoscopy;  Laterality: N/A;   EYE SURGERY  2005   catarct surgery   optic lens surgery  2005    Family History  Problem Relation Age of Onset   Arthritis Mother    Diabetes Mother    Heart disease Mother    Stroke Mother    Arthritis Father    Diabetes Father    Heart disease Father     Social History:  reports that he has been smoking cigarettes. He has been smoking an average of .25 packs per day. He has never used smokeless tobacco. He reports that he does not drink alcohol and does not use drugs.  Review of Systems:  Hypertension:  on Bystolic 20 mg and on Benicar 40 mg, prescribed by his PCP  BP Readings from Last 3 Encounters:  07/19/21 112/64  06/16/21 108/60  05/03/21 120/70   RENAL function is slightly better Normal results of microalbumin as of 4/23 No significant edema lately  Lab Results  Component Value Date   CREATININE 1.31 06/13/2021   CREATININE 1.41 04/29/2021   CREATININE 1.49 03/22/2021    Lipids: LDL is at target as follows Treatment prescribed by PCP, he is on Livalo 1 mg   Lab Results  Component Value Date   CHOL 128 03/10/2021   HDL 45.70 03/10/2021   LDLCALC 58 03/10/2021   TRIG 122.0 03/10/2021   CHOLHDL 3 03/10/2021    Neuropathy: Has had paresthesiae in feet and legs, these are treated  with gabapentin 300 mg 3 times a day   Forms for the diabetic shoes were sent in July 2021 to the podiatrist. Has sensory loss on exam He had surgery for foot ulcer on the right side     Examination:   BP 112/64   Pulse 60   Ht _0  (1.854 m)   Wt 191 lb 6.4 oz (86.8 kg)   SpO2 (!) 86%   BMI 25.25 kg/m   Body mass index is 25.25 kg/m.    No lower leg edema present  ASSESSMENT/ PLAN:    Diabetes type 2 on insulin:   See history of present illness for detailed discussion of his current management, blood sugar patterns and problems identified  His A1c is 7.4   He is on basal bolus insulin, Metformin, Farxiga, Ozempic 0.5 mg weekly and Actos 30 mg  Although his A1c is better his blood sugars are overall still high Recent GMI is 7.5 but with the libre sensor his time in target is only 61% His basic patterns of his blood sugars were reviewed in detail  Discussed adjustment of basal insulin based on fasting reading Also he may benefit from increasing Ozempic with overall better sugars without increasing insulin further Generally watching his diet but he says that it is more expensive to follow a healthier diet  Unable to exercise  Recommendations:  He will change the Toujeo to 48 units every morning If his morning sugars are still over about 150 he will go up to 52 units Continue adjusting Humalog 5 units up or down based on his meal size and carbohydrates Again we will consider switching him to Dexcom if available since home blood sugars are appearing higher than the lab glucose on the same day at the same time but this does not appear to be consistent  Hypertension: Blood pressure normal and renal function stable    There are no Patient Instructions on file for this visit.   Time for evaluation and management and total visit time including counseling = 30 minutes    Elayne Snare 07/19/2021, 12:08 PM           ..

## 2021-07-19 NOTE — Patient Instructions (Addendum)
Toujeo 48 Units and in 1 week if am sugar stays > 150 go to 52

## 2021-07-22 ENCOUNTER — Encounter: Payer: Self-pay | Admitting: Endocrinology

## 2021-07-26 ENCOUNTER — Other Ambulatory Visit: Payer: Self-pay

## 2021-07-26 ENCOUNTER — Other Ambulatory Visit: Payer: Self-pay | Admitting: Internal Medicine

## 2021-07-26 ENCOUNTER — Other Ambulatory Visit: Payer: 59

## 2021-07-26 DIAGNOSIS — B20 Human immunodeficiency virus [HIV] disease: Secondary | ICD-10-CM

## 2021-07-27 LAB — T-HELPER CELL (CD4) - (RCID CLINIC ONLY)
CD4 % Helper T Cell: 37 % (ref 33–65)
CD4 T Cell Abs: 648 /uL (ref 400–1790)

## 2021-07-28 ENCOUNTER — Other Ambulatory Visit: Payer: Self-pay | Admitting: Internal Medicine

## 2021-07-28 DIAGNOSIS — F32A Depression, unspecified: Secondary | ICD-10-CM

## 2021-07-29 LAB — HIV-1 RNA QUANT-NO REFLEX-BLD
HIV 1 RNA Quant: NOT DETECTED Copies/mL
HIV-1 RNA Quant, Log: NOT DETECTED Log cps/mL

## 2021-08-01 ENCOUNTER — Other Ambulatory Visit: Payer: Self-pay

## 2021-08-01 DIAGNOSIS — E1140 Type 2 diabetes mellitus with diabetic neuropathy, unspecified: Secondary | ICD-10-CM

## 2021-08-01 MED ORDER — PIOGLITAZONE HCL 30 MG PO TABS: ORAL_TABLET | ORAL | 1 refills | Status: DC

## 2021-08-03 ENCOUNTER — Ambulatory Visit: Payer: 59 | Admitting: Endocrinology

## 2021-08-04 ENCOUNTER — Ambulatory Visit (INDEPENDENT_AMBULATORY_CARE_PROVIDER_SITE_OTHER): Payer: 59 | Admitting: Podiatry

## 2021-08-04 ENCOUNTER — Ambulatory Visit (INDEPENDENT_AMBULATORY_CARE_PROVIDER_SITE_OTHER): Payer: 59

## 2021-08-04 DIAGNOSIS — M2042 Other hammer toe(s) (acquired), left foot: Secondary | ICD-10-CM

## 2021-08-04 DIAGNOSIS — E1140 Type 2 diabetes mellitus with diabetic neuropathy, unspecified: Secondary | ICD-10-CM | POA: Diagnosis not present

## 2021-08-04 DIAGNOSIS — F17200 Nicotine dependence, unspecified, uncomplicated: Secondary | ICD-10-CM | POA: Diagnosis not present

## 2021-08-04 DIAGNOSIS — L97522 Non-pressure chronic ulcer of other part of left foot with fat layer exposed: Secondary | ICD-10-CM | POA: Diagnosis not present

## 2021-08-04 DIAGNOSIS — Z794 Long term (current) use of insulin: Secondary | ICD-10-CM

## 2021-08-04 MED ORDER — MUPIROCIN 2 % EX OINT: 1.0000 | TOPICAL_OINTMENT | Freq: Two times a day (BID) | CUTANEOUS | 2 refills | Status: DC

## 2021-08-04 MED ORDER — DOXYCYCLINE HYCLATE 100 MG PO TABS: 100.0000 mg | ORAL_TABLET | Freq: Two times a day (BID) | ORAL | 0 refills | Status: DC

## 2021-08-07 NOTE — Progress Notes (Signed)
  Subjective:  Patient ID: Nicholas Whitehead, male    DOB: 14-Apr-1955,  MRN: [id number]  Chief Complaint  Patient presents with   Foot Problem    HAMM[place]OE, left foot, pain is at a minimum     66 y.o. male presents with the above complaint. History confirmed with patient.  He is referred to me by Dr. Elisha Ponder.  The right foot is doing well and has had no recurrence of ulceration.  He is developed an ulcer on the left great toe as well now.  Says his A1c is doing well.  Says he is still smoking occasionally Objective:  Physical Exam: warm, good capillary refill, no trophic changes or ulcerative lesions, normal DP and PT pulses, and absent protective sensation, he has a full-thickness ulceration that is small measuring 3 mm x 3 mm x 2 mm at the tip of the left hallux.  No signs infection no cellulitis purulence or exposed bone or tendon     Radiographs: Multiple views x-ray of the left foot: No clear evidence of osteomyelitis he does have hallux malleus Assessment:   1. Ulcer of toe of left foot, with fat layer exposed ([place])   2. Hammer toe of left foot   3. Type 2 diabetes mellitus with diabetic neuropathy, with long-term current use of insulin ([place])   4. Current every day smoker      Plan:  Patient was evaluated and treated and all questions answered.  We discussed his hallux malleus contracture and deformity as well as the presence of ulceration.  Currently no clinical signs of infection although it is unclear the chronicity of this currently.  I discussed with him that determining if there is infection and this will be critical prior to proceeding with any surgery and so I have ordered an MRI for this to evaluate.  If MRI is negative for osteomyelitis then I think correction of the deformity with an interphalangeal joint fusion likely done on his other foot would be in his best interest to prevent further contracture and ulceration to offload the wound.  Otherwise he will be at high risk  of amputation.  Rx for doxycycline sent to pharmacy.  He will change daily with mupirocin.  I will see him back in 2 weeks for reevaluation  Return in about 2 weeks (around 08/18/2021) for wound care, surgery planning.

## 2021-08-08 ENCOUNTER — Other Ambulatory Visit: Payer: Self-pay

## 2021-08-09 ENCOUNTER — Encounter: Payer: Self-pay | Admitting: Internal Medicine

## 2021-08-09 ENCOUNTER — Ambulatory Visit (INDEPENDENT_AMBULATORY_CARE_PROVIDER_SITE_OTHER): Payer: 59 | Admitting: Internal Medicine

## 2021-08-09 ENCOUNTER — Other Ambulatory Visit: Payer: Self-pay

## 2021-08-09 VITALS — BP 95/58 | HR 73 | Temp 97.8°F | Wt 189.0 lb

## 2021-08-09 DIAGNOSIS — F419 Anxiety disorder, unspecified: Secondary | ICD-10-CM

## 2021-08-09 DIAGNOSIS — E785 Hyperlipidemia, unspecified: Secondary | ICD-10-CM | POA: Diagnosis not present

## 2021-08-09 DIAGNOSIS — F32A Depression, unspecified: Secondary | ICD-10-CM

## 2021-08-09 DIAGNOSIS — B20 Human immunodeficiency virus [HIV] disease: Secondary | ICD-10-CM

## 2021-08-09 DIAGNOSIS — Z113 Encounter for screening for infections with a predominantly sexual mode of transmission: Secondary | ICD-10-CM

## 2021-08-09 MED ORDER — ODEFSEY 200-25-25 MG PO TABS: 1.0000 | ORAL_TABLET | Freq: Every day | ORAL | 3 refills | Status: DC

## 2021-08-09 NOTE — Assessment & Plan Note (Signed)
He continues to do well, no new concerns.  No issues with Odefsey and no changes indicated.  He will continue with 6 month follow up.

## 2021-08-09 NOTE — Progress Notes (Signed)
   Subjective:    Patient ID: Nicholas Whitehead, male    DOB: 14-Jan-1955, 66 y.o.   MRN: [id number]  HPI Here for follow up of HIV He continues on [facility] and denies any missed doses.  CD4 648 and viral load not detected.  No new issues.  He did go to a high school reuninon and enjoyed his time there.  No new concerns.     Review of Systems  Constitutional:  Negative for fatigue.  Gastrointestinal:  Negative for diarrhea.  Skin:  Negative for rash.       Objective:   Physical Exam Pulmonary:     Effort: Pulmonary effort is normal.  Skin:    Findings: No rash.  Neurological:     General: No focal deficit present.     Mental Status: He is alert.           Assessment & Plan:

## 2021-08-09 NOTE — Assessment & Plan Note (Signed)
Stable.  No si and no new concerns expressed today.

## 2021-08-12 ENCOUNTER — Ambulatory Visit
Admission: RE | Admit: 2021-08-12 | Discharge: 2021-08-12 | Disposition: A | Discharge status: Home / Self Care | Payer: 59 | Source: Ambulatory Visit | Attending: Podiatry | Admitting: Podiatry

## 2021-08-12 DIAGNOSIS — L97522 Non-pressure chronic ulcer of other part of left foot with fat layer exposed: Secondary | ICD-10-CM

## 2021-08-16 ENCOUNTER — Other Ambulatory Visit: Payer: Self-pay | Admitting: Internal Medicine

## 2021-08-16 DIAGNOSIS — E785 Hyperlipidemia, unspecified: Secondary | ICD-10-CM

## 2021-08-16 LAB — HM DIABETES EYE EXAM

## 2021-08-23 ENCOUNTER — Other Ambulatory Visit: Payer: Self-pay | Admitting: Podiatry

## 2021-08-23 ENCOUNTER — Telehealth: Payer: Self-pay

## 2021-08-23 ENCOUNTER — Ambulatory Visit (INDEPENDENT_AMBULATORY_CARE_PROVIDER_SITE_OTHER): Payer: 59 | Admitting: Podiatry

## 2021-08-23 DIAGNOSIS — M869 Osteomyelitis, unspecified: Secondary | ICD-10-CM

## 2021-08-23 NOTE — Progress Notes (Signed)
  Subjective:  Patient ID: Nicholas Whitehead, male    DOB: 1955-06-15,  MRN: [id number]  Chief Complaint  Patient presents with   Wound Check    2 weeks for wound care, surgery planning. Patient stated he does not have pain but feel uncomfortable with the ulcer to left foot. Patient is diabetic. Patient is applying a small amount of medi-honey once daily.     66 y.o. male presents with the above complaint. History confirmed with patient.  He is referred to me by Dr. Elisha Ponder.  The right foot is doing well and has had no recurrence of ulceration.  He is developed an ulcer on the left great toe as well now.  Says his A1c is doing well.  Says he is still smoking occasionally  Interval history: He completed the MRI says the wound is about the same Objective:  Physical Exam: warm, good capillary refill, no trophic changes or ulcerative lesions, normal DP and PT pulses, and absent protective sensation, he has a full-thickness ulceration that is small measuring 3 mm x 1 mm x 1 mm at the tip of the left hallux.  No signs infection no cellulitis purulence or exposed bone or tendon     MRI shows osteomyelitis of the distal tuft of the distal phalanx  Assessment:   1. Osteomyelitis of great toe of left foot ([place])      Plan:  Patient was evaluated and treated and all questions answered.  We reviewed the results of his MRI.  We again discussed his overall hallux malleus deformity.  I discussed with him that prior to arthrodesis of the IPJ I would recommend staged surgical intervention to control the osteomyelitis.  This appears to be primarily isolated to the distal tuft of the phalanx, expect biopsy and resection of this portion of the distal phalanx should provide [name] with a primarily surgical cure.  We discussed the risk and benefits of this procedure.  We discussed that this would be a staged procedure and he would still require a second surgery for arthrodesis at a later date.  The wound was prepped  with Betadine and debrided to subcutaneous tissue today and a culture of the area was taken.  Surgery be scheduled for this week, he will be WBAT in a surgical shoe postoperatively.  Informed consent was signed and reviewed  No follow-ups on file.

## 2021-08-23 NOTE — Telephone Encounter (Signed)
DOS 08/26/2021  WOUND EXCISION & BONE BIOPSY LT - [place] EFFECTIVE 01/09/2021   Notification or Prior Authorization is not required for the requested services  Decision ID #:[id number]

## 2021-08-26 ENCOUNTER — Other Ambulatory Visit: Payer: Self-pay | Admitting: Podiatry

## 2021-08-26 DIAGNOSIS — M86472 Chronic osteomyelitis with draining sinus, left ankle and foot: Secondary | ICD-10-CM | POA: Diagnosis not present

## 2021-08-26 LAB — WOUND CULTURE
MICRO NUMBER:: 13781861
SPECIMEN QUALITY:: ADEQUATE

## 2021-08-26 LAB — HOUSE ACCOUNT TRACKING

## 2021-08-26 MED ORDER — OXYCODONE-ACETAMINOPHEN 5-325 MG PO TABS: 1.0000 | ORAL_TABLET | Freq: Four times a day (QID) | ORAL | 0 refills | Status: AC | PRN

## 2021-08-26 MED ORDER — DOXYCYCLINE HYCLATE 100 MG PO TABS: 100.0000 mg | ORAL_TABLET | Freq: Two times a day (BID) | ORAL | 0 refills | Status: DC

## 2021-08-26 NOTE — Progress Notes (Signed)
8/18 bone resection and biopsy

## 2021-08-31 ENCOUNTER — Ambulatory Visit: Payer: 59 | Admitting: Podiatry

## 2021-09-01 ENCOUNTER — Ambulatory Visit (INDEPENDENT_AMBULATORY_CARE_PROVIDER_SITE_OTHER): Payer: 59

## 2021-09-01 ENCOUNTER — Ambulatory Visit (INDEPENDENT_AMBULATORY_CARE_PROVIDER_SITE_OTHER): Payer: 59 | Admitting: Podiatry

## 2021-09-01 DIAGNOSIS — Z9889 Other specified postprocedural states: Secondary | ICD-10-CM

## 2021-09-01 DIAGNOSIS — M869 Osteomyelitis, unspecified: Secondary | ICD-10-CM

## 2021-09-01 DIAGNOSIS — M86071 Acute hematogenous osteomyelitis, right ankle and foot: Secondary | ICD-10-CM | POA: Diagnosis not present

## 2021-09-01 NOTE — Progress Notes (Signed)
  Subjective:  Patient ID: Nicholas Whitehead, male    DOB: [birth year],  MRN: [id number]  Chief Complaint  Patient presents with   Routine Post Op       POV #1 DOS 08/26/2021 WOUND EXCISION & BONE BIOPSY LT GREAT TOE     66 y.o. male returns for post-op check.  He is doing well is not having much pain  Review of Systems: Negative except as noted in the HPI. Denies N/V/F/Ch.   Objective:  There were no vitals filed for this visit. There is no height or weight on file to calculate BMI. Constitutional Well developed. Well nourished.  Vascular Foot warm and well perfused. Capillary refill normal to all digits.  Calf is soft and supple, no posterior calf or knee pain, negative Homans' sign  Neurologic Normal speech. Oriented to person, place, and time. Epicritic sensation to light touch grossly reduced consistent with his neuropathy bilaterally.  Dermatologic Skin healing well without signs of infection. Skin edges well coapted without signs of infection.  Orthopedic: Tenderness to palpation noted about the surgical site.   Multiple view plain film radiographs: Interval resection of distal tuft of phalanx Assessment:   1. Post-operative state    Plan:  Patient was evaluated and treated and all questions answered.  S/p foot surgery left -Progressing as expected post-operatively. -XR: Noted above -WB Status: WBAT in surgical shoe -Sutures: Plan to remove in 2 weeks. -Medications: No refills required, continue antibiotics -Foot redressed.   -Path is still pending we will discuss his results at the next visit  Return in about 2 weeks (around 09/15/2021) for post op (no x-rays), suture removal.

## 2021-09-14 ENCOUNTER — Telehealth: Payer: Self-pay

## 2021-09-14 NOTE — Telephone Encounter (Signed)
Patient called wanting to know if we have any libre 2 sensors. Informed we do not. He says he has a shipment coming in few days.

## 2021-09-15 ENCOUNTER — Ambulatory Visit (INDEPENDENT_AMBULATORY_CARE_PROVIDER_SITE_OTHER): Payer: 59 | Admitting: Podiatry

## 2021-09-15 ENCOUNTER — Telehealth: Payer: Self-pay | Admitting: Urology

## 2021-09-15 DIAGNOSIS — M869 Osteomyelitis, unspecified: Secondary | ICD-10-CM

## 2021-09-15 MED ORDER — OXYCODONE-ACETAMINOPHEN 7.5-325 MG PO TABS: 1.0000 | ORAL_TABLET | Freq: Four times a day (QID) | ORAL | 0 refills | Status: AC | PRN

## 2021-09-15 MED ORDER — DOXYCYCLINE HYCLATE 100 MG PO TABS
100.0000 mg | ORAL_TABLET | Freq: Two times a day (BID) | ORAL | 0 refills | Status: DC
Start: 2021-09-15 — End: 2022-01-22

## 2021-09-15 NOTE — Telephone Encounter (Signed)
DOS - 09/23/21  AMPUTATION 1ST LEFT --- 14239  [employer] EFFECTIVE DATE - 01/09/21  PLAN DEDUCTIBLE - $233.00 W/ $0.00 REMAINING OUT OF POCKET - $8,300.00 W/ $7,356.40 REMAINING COINSURANCE - 20% COPAY - $0.00   PER [employer] WEBSITE FOR CPT CODE 53202 Notification or Prior Authorization is not required for the requested services  Decision ID #:[id number]

## 2021-09-18 NOTE — Progress Notes (Signed)
  Subjective:  Patient ID: Manjot Hinks, male    DOB: Dec 06, 1955,  MRN: [id number]  Chief Complaint  Patient presents with   Routine Post Op    POV #2 DOS 08/26/2021 WOUND EXCISION & BONE BIOPSY LT GREAT TOE     66 y.o. male returns for post-op check.    Review of Systems: Negative except as noted in the HPI. Denies N/V/F/Ch.   Objective:  There were no vitals filed for this visit. There is no height or weight on file to calculate BMI. Constitutional Well developed. Well nourished.  Vascular Foot warm and well perfused. Capillary refill normal to all digits.  Calf is soft and supple, no posterior calf or knee pain, negative Homans' sign  Neurologic Normal speech. Oriented to person, place, and time. Epicritic sensation to light touch grossly reduced consistent with his neuropathy bilaterally.  Dermatologic Skin healing well without signs of infection. Skin edges well coapted without signs of infection.  Orthopedic: Tenderness to palpation noted about the surgical site.   Multiple view plain film radiographs: Interval resection of distal tuft of phalanx Assessment:   1. Osteomyelitis of great toe of left foot ([place])     Plan:  Patient was evaluated and treated and all questions answered.  S/p foot surgery left -We reviewed the results of his biopsy and the presence of the osteomyelitis.  We discussed further treatment.  I do not think antibiotics alone will be sufficient to be curative for the osteomyelitis.  I recommended partial amputation.  Informed sent was signed and reviewed.  No guarantees to the outcome of surgery were made.  Surgery will be scheduled for next week.  Recommend he continue his antibiotics for now  No follow-ups on file.

## 2021-09-22 ENCOUNTER — Ambulatory Visit (INDEPENDENT_AMBULATORY_CARE_PROVIDER_SITE_OTHER): Payer: 59 | Admitting: Internal Medicine

## 2021-09-22 ENCOUNTER — Encounter: Payer: Self-pay | Admitting: Internal Medicine

## 2021-09-22 VITALS — BP 112/64 | HR 65 | Temp 98.3°F | Ht 73.0 in | Wt 188.0 lb

## 2021-09-22 DIAGNOSIS — E1140 Type 2 diabetes mellitus with diabetic neuropathy, unspecified: Secondary | ICD-10-CM

## 2021-09-22 DIAGNOSIS — I10 Essential (primary) hypertension: Secondary | ICD-10-CM

## 2021-09-22 DIAGNOSIS — N1831 Chronic kidney disease, stage 3a: Secondary | ICD-10-CM

## 2021-09-22 DIAGNOSIS — Z23 Encounter for immunization: Secondary | ICD-10-CM | POA: Diagnosis not present

## 2021-09-22 DIAGNOSIS — I119 Hypertensive heart disease without heart failure: Secondary | ICD-10-CM

## 2021-09-22 DIAGNOSIS — Z794 Long term (current) use of insulin: Secondary | ICD-10-CM

## 2021-09-22 DIAGNOSIS — E785 Hyperlipidemia, unspecified: Secondary | ICD-10-CM

## 2021-09-22 DIAGNOSIS — N400 Enlarged prostate without lower urinary tract symptoms: Secondary | ICD-10-CM | POA: Diagnosis not present

## 2021-09-22 DIAGNOSIS — Z0001 Encounter for general adult medical examination with abnormal findings: Secondary | ICD-10-CM | POA: Diagnosis not present

## 2021-09-22 MED ORDER — SHINGRIX 50 MCG/0.5ML IM SUSR: 0.5000 mL | Freq: Once | INTRAMUSCULAR | 1 refills | Status: AC

## 2021-09-22 MED ORDER — GLUCAGON EMERGENCY 1 MG IJ KIT: 1.0000 mg | PACK | Freq: Once | INTRAMUSCULAR | 5 refills | Status: DC

## 2021-09-22 NOTE — Progress Notes (Unsigned)
Subjective:  Patient ID: Nicholas Whitehead, male    DOB: 25-Sep-1955  Age: 66 y.o. MRN: [id number]  CC: Annual Exam, Hypertension, Hyperlipidemia, and Diabetes   HPI Stace Peace presents for a CPX and f/up -  Outpatient Medications Prior to Visit  Medication Sig Dispense Refill   albuterol (VENTOLIN HFA) 108 (90 Base) MCG/ACT inhaler Inhale 2 puffs into the lungs every 6 (six) hours as needed for wheezing or shortness of breath. 1 each 5   Alcohol Swabs (B-D SINGLE USE SWABS REGULAR) PADS USE AS DIRECTED 300 each 1   aspirin EC 81 MG tablet Take 81 mg by mouth every morning.     cetirizine (ZYRTEC) 10 MG tablet Take 10 mg by mouth daily.     Cholecalciferol 2000 units TABS Take 1 tablet (2,000 Units total) by mouth daily. 90 tablet 1   doxycycline (VIBRA-TABS) 100 MG tablet Take 1 tablet (100 mg total) by mouth 2 (two) times daily. 20 tablet 0   DULoxetine (CYMBALTA) 60 MG capsule TAKE 1 CAPSULE(60 MG) BY MOUTH DAILY 90 capsule 1   emtricitabine-rilpivir-tenofovir AF (ODEFSEY) 200-25-25 MG TABS tablet Take 1 tablet by mouth daily. 90 tablet 3   FARXIGA 10 MG TABS tablet TAKE 1 TABLET(10 MG) BY MOUTH DAILY BEFORE AND BREAKFAST 90 tablet 1   gabapentin (NEURONTIN) 300 MG capsule TAKE 1 CAPSULE BY MOUTH THREE TIMES DAILY 270 capsule 1   Glucagon (GVOKE HYPOPEN 2-PACK) 1 MG/0.2ML SOAJ Inject 1 Act into the skin daily as needed. 2 mL 6   glucose blood (ACCU-CHEK GUIDE) test strip TEST BLOOD SUGAR FOUR TIMES DAILY 400 strip 3   Glycopyrrolate-Formoterol (BEVESPI AEROSPHERE) 9-4.8 MCG/ACT AERO Inhale 2 puffs into the lungs 2 (two) times daily. 32.1 g 1   HUMALOG KWIKPEN 100 UNIT/ML KwikPen ADMINISTER 25 TO 30 UNITS UNDER THE SKIN THREE TIMES DAILY 30 mL 3   insulin glargine, 1 Unit Dial, (TOUJEO SOLOSTAR) 300 UNIT/ML Solostar Pen Inject 44 units daily 6 mL 3   Insulin Pen Needle (B-D ULTRAFINE III SHORT PEN) 31G X 8 MM MISC USE TO INJECT INSULIN AS DIRECTED 500 each 1   Lancets MISC Use to test  blood sugar tid. DX: E11.8 300 each 3   LIVALO 1 MG TABS TAKE 1 TABLET(1 MG) BY MOUTH DAILY 90 tablet 1   mupirocin ointment (BACTROBAN) 2 % Apply 1 Application topically 2 (two) times daily. 30 g 2   Nebivolol HCl 20 MG TABS TAKE 1 TABLET(20 MG) BY MOUTH DAILY 90 tablet 1   olmesartan (BENICAR) 40 MG tablet TAKE 1 TABLET(40 MG) BY MOUTH DAILY 90 tablet 1   pioglitazone (ACTOS) 30 MG tablet TAKE 1 TABLET(30 MG) BY MOUTH DAILY 90 tablet 1   Semaglutide, 1 MG/DOSE, (OZEMPIC, 1 MG/DOSE,) 4 MG/3ML SOPN Inject 1 mg weekly 3 mL 5   Suvorexant (BELSOMRA) 20 MG TABS Take 20 mg by mouth at bedtime as needed. 90 tablet 1   torsemide (DEMADEX) 20 MG tablet TAKE 1 TABLET(20 MG) BY MOUTH DAILY (Patient taking differently: Take 20 mg by mouth every Monday, Wednesday, and Friday.) 90 tablet 1   traZODone (DESYREL) 150 MG tablet TAKE 3 TABLETS(450 MG) BY MOUTH AT BEDTIME 270 tablet 1   Glucagon, rDNA, (GLUCAGON EMERGENCY) 1 MG KIT      No facility-administered medications prior to visit.    ROS Review of Systems  Objective:  BP 112/64 (BP Location: Right Arm, Patient Position: Sitting, Cuff Size: Large)   Pulse 65   Temp  98.3 F (36.8 C) (Oral)   Ht 6' 1"  (1.854 m)   Wt 188 lb (85.3 kg)   SpO2 94%   BMI 24.80 kg/m   BP Readings from Last 3 Encounters:  09/22/21 112/64  08/09/21 (!) 95/58  07/19/21 112/64    Wt Readings from Last 3 Encounters:  09/22/21 188 lb (85.3 kg)  08/09/21 189 lb (85.7 kg)  07/19/21 191 lb 6.4 oz (86.8 kg)    Physical Exam Cardiovascular:     Rate and Rhythm: Normal rate and regular rhythm.     Heart sounds: Normal heart sounds, S1 normal and S2 normal. No murmur heard.    No friction rub. No gallop.     Comments: EKG- NSR, 65 bpm ?anterior infract pattern is old No LVH Genitourinary:    Comments: He was not willing to undress for a GU/rectal exam Musculoskeletal:     Right lower leg: No edema.     Left lower leg: No edema.     Lab Results  Component  Value Date   WBC 6.4 12/20/2020   HGB 13.3 12/20/2020   HCT 39.4 12/20/2020   PLT 164.0 12/20/2020   GLUCOSE 165 (H) 06/13/2021   CHOL 128 03/10/2021   TRIG 122.0 03/10/2021   HDL 45.70 03/10/2021   LDLCALC 58 03/10/2021   ALT 19 06/10/2020   AST 23 06/10/2020   NA 138 06/13/2021   K 4.2 06/13/2021   CL 105 06/13/2021   CREATININE 1.31 06/13/2021   BUN 14 06/13/2021   CO2 28 06/13/2021   TSH 1.45 01/19/2020   PSA 1.36 04/29/2020   HGBA1C 7.4 (H) 06/13/2021   MICROALBUR <0.7 04/29/2021    MR TOES LEFT WO CONTRAST  Result Date: 08/12/2021 CLINICAL DATA:  Foot swelling, diabetic, osteomyelitis suspected, xray done EXAM: MRI OF THE LEFT TOES WITHOUT CONTRAST TECHNIQUE: Multiplanar, multisequence MR imaging of the left toes was performed. No intravenous contrast was administered. COMPARISON:  Foot radiograph 08/04/2021 FINDINGS: Bones/Joint/Cartilage There is marrow edema within the distal phalanx of the great toe, with low T1 signal in the distal tuft. No other significant marrow signal alteration. Ligaments Intact Lisfranc ligament. Intact MTP collateral ligaments. No evidence of plantar plate tear. Muscles and Tendons No acute tendon tear. There is intramuscular edema and atrophy in the forefoot, likely denervation change. Soft tissues There is generalized soft tissue swelling of the foot. No focal fluid collection. IMPRESSION: Marrow edema within the distal phalanx of the great toe with confluent low T1 signal in the distal tuft, consistent with osteomyelitis. No evidence of soft tissue abscess. These results will be called to the ordering clinician or representative by the Radiologist Assistant, and communication documented in the PACS or [employer]. Electronically Signed   By: Maurine Simmering M.D.   On: 08/12/2021 16:16    Assessment & Plan:   Yakub was seen today for annual exam, hypertension, hyperlipidemia and diabetes.  Diagnoses and all orders for this visit:  Hypertensive  left ventricular hypertrophy, without heart failure  Type 2 diabetes mellitus with diabetic neuropathy, with long-term current use of insulin (HCC) -     Glucagon, rDNA, (GLUCAGON EMERGENCY) 1 MG KIT; Inject 1 mg into the skin once for 1 dose.  Benign prostatic hyperplasia without lower urinary tract symptoms -     PSA; Future  Stage 3a chronic kidney disease ([place])  Primary hypertension -     CBC with Differential/Platelet; Future -     Hepatic function panel; Future  Hyperlipidemia LDL  goal <100 -     Lipid panel; Future -     Hepatic function panel; Future  Other orders -     Flu Vaccine QUAD 6+ mos PF IM (Fluarix Quad PF) -     Zoster Vaccine Adjuvanted [facility]) injection; Inject 0.5 mLs into the muscle once for 1 dose.   I have changed Leane Para Langenbach's Glucagon Emergency. I am also having him start on Shingrix. Additionally, I am having him maintain his cetirizine, aspirin EC, Cholecalciferol, Lancets, Accu-Chek Guide, B-D SINGLE USE SWABS REGULAR, Gvoke HypoPen 2-Pack, B-D ULTRAFINE III SHORT PEN, traZODone, Nebivolol HCl, torsemide, olmesartan, gabapentin, Bevespi Aerosphere, albuterol, Belsomra, HumaLOG KwikPen, Toujeo SoloStar, Farxiga, Ozempic (1 MG/DOSE), DULoxetine, pioglitazone, mupirocin ointment, Odefsey, Livalo, and doxycycline.  Meds ordered this encounter  Medications   Glucagon, rDNA, (GLUCAGON EMERGENCY) 1 MG KIT    Sig: Inject 1 mg into the skin once for 1 dose.    Dispense:  1 kit    Refill:  5   Zoster Vaccine Adjuvanted [facility]) injection    Sig: Inject 0.5 mLs into the muscle once for 1 dose.    Dispense:  0.5 mL    Refill:  1     Follow-up: No follow-ups on file.  Scarlette Calico, MD

## 2021-09-22 NOTE — Patient Instructions (Signed)
Health Maintenance, Male Adopting a healthy lifestyle and getting preventive care are important in promoting health and wellness. Ask your health care provider about: The right schedule for you to have regular tests and exams. Things you can do on your own to prevent diseases and keep yourself healthy. What should I know about diet, weight, and exercise? Eat a healthy diet  Eat a diet that includes plenty of vegetables, fruits, low-fat dairy products, and lean protein. Do not eat a lot of foods that are high in solid fats, added sugars, or sodium. Maintain a healthy weight Body mass index (BMI) is a measurement that can be used to identify possible weight problems. It estimates body fat based on height and weight. Your health care provider can help determine your BMI and help you achieve or maintain a healthy weight. Get regular exercise Get regular exercise. This is one of the most important things you can do for your health. Most adults should: Exercise for at least 150 minutes each week. The exercise should increase your heart rate and make you sweat (moderate-intensity exercise). Do strengthening exercises at least twice a week. This is in addition to the moderate-intensity exercise. Spend less time sitting. Even light physical activity can be beneficial. Watch cholesterol and blood lipids Have your blood tested for lipids and cholesterol at 64 years of age, then have this test every 5 years. You may need to have your cholesterol levels checked more often if: Your lipid or cholesterol levels are high. You are older than 64 years of age. You are at high risk for heart disease. What should I know about cancer screening? Many types of cancers can be detected early and may often be prevented. Depending on your health history and family history, you may need to have cancer screening at various ages. This may include screening for: Colorectal cancer. Prostate cancer. Skin cancer. Lung  cancer. What should I know about heart disease, diabetes, and high blood pressure? Blood pressure and heart disease High blood pressure causes heart disease and increases the risk of stroke. This is more likely to develop in people who have high blood pressure readings or are overweight. Talk with your health care provider about your target blood pressure readings. Have your blood pressure checked: Every 3-5 years if you are 18-39 years of age. Every year if you are 40 years old or older. If you are between the ages of 65 and 75 and are a current or former smoker, ask your health care provider if you should have a one-time screening for abdominal aortic aneurysm (AAA). Diabetes Have regular diabetes screenings. This checks your fasting blood sugar level. Have the screening done: Once every three years after age 45 if you are at a normal weight and have a low risk for diabetes. More often and at a younger age if you are overweight or have a high risk for diabetes. What should I know about preventing infection? Hepatitis B If you have a higher risk for hepatitis B, you should be screened for this virus. Talk with your health care provider to find out if you are at risk for hepatitis B infection. Hepatitis C Blood testing is recommended for: Everyone born from 1945 through 1965. Anyone with known risk factors for hepatitis C. Sexually transmitted infections (STIs) You should be screened each year for STIs, including gonorrhea and chlamydia, if: You are sexually active and are younger than 64 years of age. You are older than 64 years of age and your   health care provider tells you that you are at risk for this type of infection. Your sexual activity has changed since you were last screened, and you are at increased risk for chlamydia or gonorrhea. Ask your health care provider if you are at risk. Ask your health care provider about whether you are at high risk for HIV. Your health care provider  may recommend a prescription medicine to help prevent HIV infection. If you choose to take medicine to prevent HIV, you should first get tested for HIV. You should then be tested every 3 months for as long as you are taking the medicine. Follow these instructions at home: Alcohol use Do not drink alcohol if your health care provider tells you not to drink. If you drink alcohol: Limit how much you have to 0-2 drinks a day. Know how much alcohol is in your drink. In the [place] one drink equals one 12 oz bottle of beer (355 mL), one 5 oz glass of wine (148 mL), or one 1 oz glass of hard liquor (44 mL). Lifestyle Do not use any products that contain nicotine or tobacco. These products include cigarettes, chewing tobacco, and vaping devices, such as e-cigarettes. If you need help quitting, ask your health care provider. Do not use street drugs. Do not share needles. Ask your health care provider for help if you need support or information about quitting drugs. General instructions Schedule regular health, dental, and eye exams. Stay current with your vaccines. Tell your health care provider if: You often feel depressed. You have ever been abused or do not feel safe at home. Summary Adopting a healthy lifestyle and getting preventive care are important in promoting health and wellness. Follow your health care provider's instructions about healthy diet, exercising, and getting tested or screened for diseases. Follow your health care provider's instructions on monitoring your cholesterol and blood pressure. This information is not intended to replace advice given to you by your health care provider. Make sure you discuss any questions you have with your health care provider. Document Revised: 05/17/2020 Document Reviewed: 05/17/2020 Elsevier Patient Education  2023 Elsevier Inc.  

## 2021-09-23 ENCOUNTER — Other Ambulatory Visit: Payer: Self-pay | Admitting: Podiatry

## 2021-09-23 DIAGNOSIS — M86672 Other chronic osteomyelitis, left ankle and foot: Secondary | ICD-10-CM | POA: Diagnosis not present

## 2021-09-23 MED ORDER — HYDROCODONE-ACETAMINOPHEN 5-325 MG PO TABS: 1.0000 | ORAL_TABLET | ORAL | 0 refills | Status: AC | PRN

## 2021-09-23 MED ORDER — OXYCODONE-ACETAMINOPHEN 5-325 MG PO TABS: 1.0000 | ORAL_TABLET | ORAL | 0 refills | Status: DC | PRN

## 2021-09-23 NOTE — Progress Notes (Signed)
9/15 partial L great toe amputation

## 2021-09-23 NOTE — Addendum Note (Signed)
Addended bySherryle Lis, Erdine Hulen R on: 09/23/2021 09:00 AM   Modules accepted: Orders

## 2021-09-26 DIAGNOSIS — Z0001 Encounter for general adult medical examination with abnormal findings: Secondary | ICD-10-CM | POA: Insufficient documentation

## 2021-09-30 ENCOUNTER — Ambulatory Visit (INDEPENDENT_AMBULATORY_CARE_PROVIDER_SITE_OTHER): Payer: 59

## 2021-09-30 ENCOUNTER — Ambulatory Visit (INDEPENDENT_AMBULATORY_CARE_PROVIDER_SITE_OTHER): Payer: 59 | Admitting: Podiatry

## 2021-09-30 DIAGNOSIS — M869 Osteomyelitis, unspecified: Secondary | ICD-10-CM

## 2021-09-30 DIAGNOSIS — Z89419 Acquired absence of unspecified great toe: Secondary | ICD-10-CM

## 2021-09-30 NOTE — Progress Notes (Signed)
Subjective:  Patient ID: Nicholas Whitehead, male    DOB: Mar 13, 1955,  MRN: [id number]  Chief Complaint  Patient presents with   Routine Post Op    POV #1 DOS 09/23/2021 PARTIAL AMPUTATION GREAT TOE LT    DOS: 09/23/2021 Procedure: Left partial great toe amputation  66 y.o. male returns for post-op check.  Patient states he is doing okay minimal pain pain is controlled.  Bandages clean dry and intact.  Surgery was done byDr. McDoneld  Review of Systems: Negative except as noted in the HPI. Denies N/V/F/Ch.  Past Medical History:  Diagnosis Date   Anxiety    Cataract    right    Depression    Diabetes mellitus without complication ([place])    DM (diabetes mellitus screen)    HIV disease ([place]) 09/12/1995   HIV infection ([place])    Hypercholesterolemia    Hyperlipidemia    Hypertension     Current Outpatient Medications:    albuterol (VENTOLIN HFA) 108 (90 Base) MCG/ACT inhaler, Inhale 2 puffs into the lungs every 6 (six) hours as needed for wheezing or shortness of breath., Disp: 1 each, Rfl: 5   Alcohol Swabs (B-D SINGLE USE SWABS REGULAR) PADS, USE AS DIRECTED, Disp: 300 each, Rfl: 1   aspirin EC 81 MG tablet, Take 81 mg by mouth every morning., Disp: , Rfl:    cetirizine (ZYRTEC) 10 MG tablet, Take 10 mg by mouth daily., Disp: , Rfl:    Cholecalciferol 2000 units TABS, Take 1 tablet (2,000 Units total) by mouth daily., Disp: 90 tablet, Rfl: 1   doxycycline (VIBRA-TABS) 100 MG tablet, Take 1 tablet (100 mg total) by mouth 2 (two) times daily., Disp: 20 tablet, Rfl: 0   DULoxetine (CYMBALTA) 60 MG capsule, TAKE 1 CAPSULE(60 MG) BY MOUTH DAILY, Disp: 90 capsule, Rfl: 1   emtricitabine-rilpivir-tenofovir AF (ODEFSEY) 200-25-25 MG TABS tablet, Take 1 tablet by mouth daily., Disp: 90 tablet, Rfl: 3   FARXIGA 10 MG TABS tablet, TAKE 1 TABLET(10 MG) BY MOUTH DAILY BEFORE AND BREAKFAST, Disp: 90 tablet, Rfl: 1   gabapentin (NEURONTIN) 300 MG capsule, TAKE 1 CAPSULE BY MOUTH THREE TIMES DAILY,  Disp: 270 capsule, Rfl: 1   Glucagon (GVOKE HYPOPEN 2-PACK) 1 MG/0.2ML SOAJ, Inject 1 Act into the skin daily as needed., Disp: 2 mL, Rfl: 6   Glucagon, rDNA, (GLUCAGON EMERGENCY) 1 MG KIT, Inject 1 mg into the skin once for 1 dose., Disp: 1 kit, Rfl: 5   glucose blood (ACCU-CHEK GUIDE) test strip, TEST BLOOD SUGAR FOUR TIMES DAILY, Disp: 400 strip, Rfl: 3   Glycopyrrolate-Formoterol (BEVESPI AEROSPHERE) 9-4.8 MCG/ACT AERO, Inhale 2 puffs into the lungs 2 (two) times daily., Disp: 32.1 g, Rfl: 1   HUMALOG KWIKPEN 100 UNIT/ML KwikPen, ADMINISTER 25 TO 30 UNITS UNDER THE SKIN THREE TIMES DAILY, Disp: 30 mL, Rfl: 3   insulin glargine, 1 Unit Dial, (TOUJEO SOLOSTAR) 300 UNIT/ML Solostar Pen, Inject 44 units daily, Disp: 6 mL, Rfl: 3   Insulin Pen Needle (B-D ULTRAFINE III SHORT PEN) 31G X 8 MM MISC, USE TO INJECT INSULIN AS DIRECTED, Disp: 500 each, Rfl: 1   Lancets MISC, Use to test blood sugar tid. DX: E11.8, Disp: 300 each, Rfl: 3   LIVALO 1 MG TABS, TAKE 1 TABLET(1 MG) BY MOUTH DAILY, Disp: 90 tablet, Rfl: 1   mupirocin ointment (BACTROBAN) 2 %, Apply 1 Application topically 2 (two) times daily., Disp: 30 g, Rfl: 2   Nebivolol HCl 20 MG TABS, TAKE 1  TABLET(20 MG) BY MOUTH DAILY, Disp: 90 tablet, Rfl: 1   olmesartan (BENICAR) 40 MG tablet, TAKE 1 TABLET(40 MG) BY MOUTH DAILY, Disp: 90 tablet, Rfl: 1   pioglitazone (ACTOS) 30 MG tablet, TAKE 1 TABLET(30 MG) BY MOUTH DAILY, Disp: 90 tablet, Rfl: 1   Semaglutide, 1 MG/DOSE, (OZEMPIC, 1 MG/DOSE,) 4 MG/3ML SOPN, Inject 1 mg weekly, Disp: 3 mL, Rfl: 5   Suvorexant (BELSOMRA) 20 MG TABS, Take 20 mg by mouth at bedtime as needed., Disp: 90 tablet, Rfl: 1   torsemide (DEMADEX) 20 MG tablet, TAKE 1 TABLET(20 MG) BY MOUTH DAILY (Patient taking differently: Take 20 mg by mouth every Monday, Wednesday, and Friday.), Disp: 90 tablet, Rfl: 1   traZODone (DESYREL) 150 MG tablet, TAKE 3 TABLETS(450 MG) BY MOUTH AT BEDTIME, Disp: 270 tablet, Rfl: 1  Social History    Tobacco Use  Smoking Status Every Day   Packs/day: 0.25   Types: Cigarettes  Smokeless Tobacco Never  Tobacco Comments   Occ smoking/ trying to quit    Allergies  Allergen Reactions   Benazepril Cough   Molds & Smuts     Hard to breathe   Pravastatin Other (See Comments)    myositis   Glipizide Nausea Only   Objective:  There were no vitals filed for this visit. There is no height or weight on file to calculate BMI. Constitutional Well developed. Well nourished.  Vascular Foot warm and well perfused. Capillary refill normal to all digits.   Neurologic Normal speech. Oriented to person, place, and time. Epicritic sensation to light touch grossly present bilaterally.  Dermatologic Skin healing well without signs of infection. Skin edges well coapted without signs of infection.  Orthopedic: Tenderness to palpation noted about the surgical site.   Radiographs: 3 views of skeletally mature left foot: Partial great toe amputation noted no signs of osteomyelitis noted Assessment:   1. History of amputation of great toe [facility])    Plan:  Patient was evaluated and treated and all questions answered.  S/p foot surgery left -Progressing as expected post-operatively. -XR: See above -WB Status: Weightbearing as tolerated in surgical shoe -Sutures: Intact.  No clinical signs of infection noted no dehiscence noted -Medications: None -Foot redressed.  No follow-ups on file.

## 2021-10-06 ENCOUNTER — Encounter: Payer: 59 | Admitting: Podiatry

## 2021-10-12 ENCOUNTER — Other Ambulatory Visit: Payer: Self-pay | Admitting: Internal Medicine

## 2021-10-12 DIAGNOSIS — I10 Essential (primary) hypertension: Secondary | ICD-10-CM

## 2021-10-12 DIAGNOSIS — E1140 Type 2 diabetes mellitus with diabetic neuropathy, unspecified: Secondary | ICD-10-CM

## 2021-10-13 ENCOUNTER — Ambulatory Visit (INDEPENDENT_AMBULATORY_CARE_PROVIDER_SITE_OTHER): Payer: 59 | Admitting: Podiatry

## 2021-10-13 DIAGNOSIS — M869 Osteomyelitis, unspecified: Secondary | ICD-10-CM | POA: Diagnosis not present

## 2021-10-13 DIAGNOSIS — Z89419 Acquired absence of unspecified great toe: Secondary | ICD-10-CM | POA: Diagnosis not present

## 2021-10-13 DIAGNOSIS — M2042 Other hammer toe(s) (acquired), left foot: Secondary | ICD-10-CM | POA: Diagnosis not present

## 2021-10-14 ENCOUNTER — Telehealth: Payer: Self-pay | Admitting: Internal Medicine

## 2021-10-14 NOTE — Telephone Encounter (Signed)
Please call patient

## 2021-10-16 NOTE — Progress Notes (Signed)
  Subjective:  Patient ID: Nicholas Whitehead, male    DOB: [birth year],  MRN: [id number]  Chief Complaint  Patient presents with   Routine Post Op     POV #2 DOS 09/23/2021 PARTIAL AMPUTATION GREAT TOE LT -SUTURE REMOVAL TODAY      66 y.o. male returns for post-op check.  Doing well pain is improving  Review of Systems: Negative except as noted in the HPI. Denies N/V/F/Ch.   Objective:  There were no vitals filed for this visit. There is no height or weight on file to calculate BMI. Constitutional Well developed. Well nourished.  Vascular Foot warm and well perfused. Capillary refill normal to all digits.  Calf is soft and supple, no posterior calf or knee pain, negative Homans' sign  Neurologic Normal speech. Oriented to person, place, and time. Epicritic sensation to light touch grossly present bilaterally.  Dermatologic Skin healing well without signs of infection. Skin edges well coapted without signs of infection.  Orthopedic: Tenderness to palpation noted about the surgical site.    Assessment:   1. History of amputation of great toe ([place])   2. Osteomyelitis of great toe of left foot ([place])    Plan:  Patient was evaluated and treated and all questions answered.  S/p foot surgery left -Progressing as expected post-operatively.  -WB Status: WBAT in regular shoe gear -Sutures: Removed today. -Medications: No refills required -Discussed with him we may likely need a prophylactic second toe tenotomy to reduce this contracture of the second toe and prevent secondary ulceration here  Return in about 3 weeks (around 11/03/2021) for post op follow up, possible toe tenotomy .

## 2021-10-17 ENCOUNTER — Other Ambulatory Visit: Payer: Self-pay | Admitting: Internal Medicine

## 2021-10-17 DIAGNOSIS — F32A Depression, unspecified: Secondary | ICD-10-CM

## 2021-10-18 ENCOUNTER — Other Ambulatory Visit: Payer: Self-pay

## 2021-10-18 DIAGNOSIS — E1165 Type 2 diabetes mellitus with hyperglycemia: Secondary | ICD-10-CM

## 2021-10-18 MED ORDER — TOUJEO SOLOSTAR 300 UNIT/ML ~~LOC~~ SOPN: PEN_INJECTOR | SUBCUTANEOUS | 3 refills | Status: DC

## 2021-10-24 ENCOUNTER — Other Ambulatory Visit (INDEPENDENT_AMBULATORY_CARE_PROVIDER_SITE_OTHER): Payer: 59

## 2021-10-24 DIAGNOSIS — Z794 Long term (current) use of insulin: Secondary | ICD-10-CM | POA: Diagnosis not present

## 2021-10-24 DIAGNOSIS — E1165 Type 2 diabetes mellitus with hyperglycemia: Secondary | ICD-10-CM

## 2021-10-24 LAB — BASIC METABOLIC PANEL
BUN: 17 mg/dL (ref 6–23)
CO2: 28 mEq/L (ref 19–32)
Calcium: 9.9 mg/dL (ref 8.4–10.5)
Chloride: 102 mEq/L (ref 96–112)
Creatinine, Ser: 1.34 mg/dL (ref 0.40–1.50)
GFR: 56.19 mL/min — ABNORMAL LOW (ref >60.00)
Glucose, Bld: 224 mg/dL — ABNORMAL HIGH (ref 70–99)
Potassium: 5 mEq/L (ref 3.5–5.1)
Sodium: 137 mEq/L (ref 135–145)

## 2021-10-24 LAB — HEMOGLOBIN A1C: Hgb A1c MFr Bld: 8.7 % — ABNORMAL HIGH (ref 4.6–6.5)

## 2021-10-27 ENCOUNTER — Telehealth: Payer: 59

## 2021-10-28 ENCOUNTER — Encounter: Payer: Self-pay | Admitting: Endocrinology

## 2021-10-28 ENCOUNTER — Ambulatory Visit (INDEPENDENT_AMBULATORY_CARE_PROVIDER_SITE_OTHER): Payer: 59 | Admitting: Endocrinology

## 2021-10-28 VITALS — BP 114/70 | HR 83 | Ht 73.0 in | Wt 189.8 lb

## 2021-10-28 DIAGNOSIS — I10 Essential (primary) hypertension: Secondary | ICD-10-CM

## 2021-10-28 DIAGNOSIS — Z794 Long term (current) use of insulin: Secondary | ICD-10-CM | POA: Diagnosis not present

## 2021-10-28 DIAGNOSIS — E1165 Type 2 diabetes mellitus with hyperglycemia: Secondary | ICD-10-CM

## 2021-10-28 NOTE — Progress Notes (Unsigned)
Patient ID: Nicholas Whitehead, male   DOB: 03-12-1955, 66 y.o.   MRN: [id number]           Patient ID: Nicholas Whitehead, male   DOB: [birth year], 66 y.o.   MRN: [id number]   Reason for Appointment: Type II Diabetes follow-up   History of Present Illness   Diagnosis date: 1998    Previous history: He was started on oral agents at diagnosis and around 2006 was put on insulin His blood sugars are usually well controlled when he takes Actos along with his insulin. He had been on Invokana since 04/2013  He requires large doses of insulin especially basal insulin   Recent history:   Insulin regimen: Toujeo: 48 units am;  Novolog 30 units before meals         Oral hypoglycemic drugs: Metformin ER 1000 mg twice a day, Actos 30 mg, Ozempic 0.5 weekly, Farxiga 71m  A1c is 8.7 compared to 7.4  Fructosamine previously 316  Current management, blood sugar patterns and problems as of 10/23: He is having much higher blood sugars overall and difficult to know what the reason since he has continued his diabetes regimen regularly However may have some doses of Ozempic because of not using the supply at the pharmacy, has started back earlier this month His blood sugars continue to be significantly higher and averaging 221 recently Does not appear to have excessive blood sugar spikes after meals except sometimes at lunch but his blood sugars appear to be high at all times over 200 throughout the day at night He is not doing any activity because of issues with his feet Weight is about the same recently  Dinner 8-9 pm    Side effects from medications:  urgency, frequent urination from Invokana   Interpretation of his CGM download for the last 2 weeks as follows, using freestyle libre 2  His blood sugars s overall are consistently increased averaging in the low 200 range at all times HIGHEST blood sugars are around midnight and lowest before dinner in the evening Blood sugars are generally similar from  day-to-day except relatively better since last night  Premeal blood sugars are around 200 as above He has POSTPRANDIAL spikes after lunch periodically but not right after dinner, however not clear why he has sometimes rise in blood sugars after midnight also  Hypoglycemia not present at any time  CGM use % of time   2-week average/GV   Time in range      12  %  % Time Above 180 67  % Time above 250 21  % Time Below 70      PRE-MEAL Fasting Lunch Dinner Bedtime Overall  Glucose range:       Averages: 222       POST-MEAL PC Breakfast PC Lunch PC Dinner  Glucose range:     Averages: 218 238 228   Previously:  CGM use % of time 70  2-week average/GV 175  Time in range      61  %  % Time Above 180 34  % Time above 250 5  % Time Below 70      PRE-MEAL Fasting Lunch Dinner Bedtime Overall  Glucose range:       Averages: 163 164 167     POST-MEAL PC Breakfast PC Lunch PC Dinner  Glucose range:     Averages:  194 175             Meals: Breakfast variable, 9-10 AM  Lunch 2-3pm  usually avoiding high-fat meals, dinner at about 6 pm          Dietician visit: Most recent: 2013    Weight control:  Wt Readings from Last 3 Encounters:  10/28/21 189 lb 12.8 oz (86.1 kg)  09/22/21 188 lb (85.3 kg)  08/09/21 189 lb (85.7 kg)          Diabetes labs:  Lab Results  Component Value Date   HGBA1C 8.7 (H) 10/24/2021   HGBA1C 7.4 (H) 06/13/2021   HGBA1C 7.3 (H) 04/29/2021   Lab Results  Component Value Date   MICROALBUR <0.7 04/29/2021   LDLCALC 58 03/10/2021   CREATININE 1.34 10/24/2021   Lab Results  Component Value Date   FRUCTOSAMINE 316 (H) 03/10/2021   FRUCTOSAMINE 367 (H) 02/01/2021   FRUCTOSAMINE 280 09/11/2019   FRUCTOSAMINE 298 (H) 04/28/2019   FRUCTOSAMINE 276 09/13/2018    Other active problems discussed today: See review of systems   Lab on 10/24/2021  Component Date Value Ref Range Status   Sodium 10/24/2021 137  135 - 145 mEq/L Final    Potassium 10/24/2021 5.0  3.5 - 5.1 mEq/L Final   Chloride 10/24/2021 102  96 - 112 mEq/L Final   CO2 10/24/2021 28  19 - 32 mEq/L Final   Glucose, Bld 10/24/2021 224 (H)  70 - 99 mg/dL Final   BUN 10/24/2021 17  6 - 23 mg/dL Final   Creatinine, Ser 10/24/2021 1.34  0.40 - 1.50 mg/dL Final   GFR 10/24/2021 56.19 (L)  >60.00 mL/min Final   Calculated using the CKD-EPI Creatinine Equation (2021)   Calcium 10/24/2021 9.9  8.4 - 10.5 mg/dL Final   Hgb A1c MFr Bld 10/24/2021 8.7 (H)  4.6 - 6.5 % Final   Glycemic Control Guidelines for People with Diabetes:Non Diabetic:  <6%Goal of Therapy: <7%Additional Action Suggested:  >8%      Allergies as of 10/28/2021       Reactions   Benazepril Cough   Molds & Smuts    Hard to breathe   Pravastatin Other (See Comments)   myositis   Glipizide Nausea Only        Medication List        Accurate as of October 28, 2021 11:59 PM. If you have any questions, ask your nurse or doctor.          Accu-Chek Guide test strip Generic drug: glucose blood TEST BLOOD SUGAR FOUR TIMES DAILY   albuterol 108 (90 Base) MCG/ACT inhaler Commonly known as: VENTOLIN HFA Inhale 2 puffs into the lungs every 6 (six) hours as needed for wheezing or shortness of breath.   aspirin EC 81 MG tablet Take 81 mg by mouth every morning.   B-D SINGLE USE SWABS REGULAR Pads USE AS DIRECTED   B-D ULTRAFINE III SHORT PEN 31G X 8 MM Misc Generic drug: Insulin Pen Needle USE TO INJECT INSULIN AS DIRECTED   Belsomra 20 MG Tabs Generic drug: Suvorexant Take 20 mg by mouth at bedtime as needed.   Bevespi Aerosphere 9-4.8 MCG/ACT Aero Generic drug: Glycopyrrolate-Formoterol Inhale 2 puffs into the lungs 2 (two) times daily.   cetirizine 10 MG tablet Commonly known as: ZYRTEC Take 10 mg by mouth daily.   Cholecalciferol 50 MCG (2000 UT) Tabs Take 1 tablet (2,000 Units total) by mouth daily.   doxycycline 100 MG tablet Commonly known as: VIBRA-TABS Take 1  tablet (100 mg total) by mouth 2 (two) times daily.   DULoxetine  60 MG capsule Commonly known as: CYMBALTA TAKE 1 CAPSULE(60 MG) BY MOUTH DAILY   Farxiga 10 MG Tabs tablet Generic drug: dapagliflozin propanediol TAKE 1 TABLET(10 MG) BY MOUTH DAILY BEFORE AND BREAKFAST   gabapentin 300 MG capsule Commonly known as: NEURONTIN TAKE 1 CAPSULE BY MOUTH THREE TIMES DAILY   Glucagon Emergency 1 MG Kit Inject 1 mg into the skin once for 1 dose.   Gvoke HypoPen 2-Pack 1 MG/0.2ML Soaj Generic drug: Glucagon Inject 1 Act into the skin daily as needed.   HumaLOG KwikPen 100 UNIT/ML KwikPen Generic drug: insulin lispro ADMINISTER 25 TO 30 UNITS UNDER THE SKIN THREE TIMES DAILY   Lancets Misc Use to test blood sugar tid. DX: E11.8   Livalo 1 MG Tabs Generic drug: Pitavastatin Calcium TAKE 1 TABLET(1 MG) BY MOUTH DAILY   mupirocin ointment 2 % Commonly known as: BACTROBAN Apply 1 Application topically 2 (two) times daily.   Nebivolol HCl 20 MG Tabs TAKE 1 TABLET(20 MG) BY MOUTH DAILY   Odefsey 200-25-25 MG Tabs tablet Generic drug: emtricitabine-rilpivir-tenofovir AF Take 1 tablet by mouth daily.   olmesartan 40 MG tablet Commonly known as: BENICAR TAKE 1 TABLET(40 MG) BY MOUTH DAILY   Ozempic (1 MG/DOSE) 4 MG/3ML Sopn Generic drug: Semaglutide (1 MG/DOSE) Inject 1 mg weekly   pioglitazone 30 MG tablet Commonly known as: ACTOS TAKE 1 TABLET(30 MG) BY MOUTH DAILY   torsemide 20 MG tablet Commonly known as: DEMADEX TAKE 1 TABLET(20 MG) BY MOUTH DAILY What changed: See the new instructions.   Toujeo SoloStar 300 UNIT/ML Solostar Pen Generic drug: insulin glargine (1 Unit Dial) Inject 44 units daily   traZODone 150 MG tablet Commonly known as: DESYREL TAKE 3 TABLETS(450 MG) BY MOUTH AT BEDTIME        Allergies:  Allergies  Allergen Reactions   Benazepril Cough   Molds & Smuts     Hard to breathe   Pravastatin Other (See Comments)    myositis   Glipizide  Nausea Only    Past Medical History:  Diagnosis Date   Anxiety    Cataract    right    Depression    Diabetes mellitus without complication ([place])    DM (diabetes mellitus screen)    HIV disease ([place]) 09/12/1995   HIV infection ([place])    Hypercholesterolemia    Hyperlipidemia    Hypertension     Past Surgical History:  Procedure Laterality Date   COLONOSCOPY WITH PROPOFOL  01/16/2012   Procedure: COLONOSCOPY WITH PROPOFOL;  Surgeon: Garlan Fair, MD;  Location: [facility];  Service: Endoscopy;  Laterality: N/A;   EYE SURGERY  2005   catarct surgery   optic lens surgery  2005    Family History  Problem Relation Age of Onset   Arthritis Mother    Diabetes Mother    Heart disease Mother    Stroke Mother    Arthritis Father    Diabetes Father    Heart disease Father     Social History:  reports that he has been smoking cigarettes. He has been smoking an average of .25 packs per day. He has never used smokeless tobacco. He reports that he does not drink alcohol and does not use drugs.  Review of Systems:  Hypertension:  on Bystolic 20 mg and on Benicar 40 mg, prescribed by his PCP   BP Readings from Last 3 Encounters:  10/28/21 114/70  09/22/21 112/64  08/09/21 (!) 95/58   RENAL function is  slightly better Normal results of microalbumin as of 4/23 No significant edema lately  Lab Results  Component Value Date   CREATININE 1.34 10/24/2021   CREATININE 1.31 06/13/2021   CREATININE 1.41 04/29/2021    Lipids: LDL is at target as follows Treatment prescribed by PCP, he is on Livalo 1 mg   Lab Results  Component Value Date   CHOL 128 03/10/2021   HDL 45.70 03/10/2021   LDLCALC 58 03/10/2021   TRIG 122.0 03/10/2021   CHOLHDL 3 03/10/2021    Neuropathy: Has had paresthesiae in feet and legs, these are treated with gabapentin 300 mg 3 times a day   Forms for the diabetic shoes were sent in July 2021 to the podiatrist. Has sensory loss on exam He  appears to have had some contracture of his tendon on his foot after surgery     Examination:   BP 114/70   Pulse 83   Ht 6' 1"  (1.854 m)   Wt 189 lb 12.8 oz (86.1 kg)   SpO2 93%   BMI 25.04 kg/m   Body mass index is 25.04 kg/m.      ASSESSMENT/ PLAN:    Diabetes type 2 on insulin:   See history of present illness for detailed discussion of his current management, blood sugar patterns and problems identified  His A1c is 8.7 compared to 7.4   He is on basal bolus insulin, Metformin, Farxiga, Ozempic 1 mg weekly and Actos 30 mg  Although he has started back on his Ozempic recently his blood sugars are well over 200 at all times GMI is also high at 8.6 Appears to need significant more basal insulin and not rising after meals significantly As before he has difficulty affording healthy foods at times  Unable to exercise again  Recommendations:   He will change the Toujeo to twice a day, previously had done well with this regimen and needed higher doses He will continue 48 units every morning but add 20 units every evening with his dinner meals He was also given a titration sheet to adjust his evening TOUJEO by 4 units every 3 days He will do this if morning sugars are consistently running over 130; on his previous visit he did not adjust his insulin as directed for high blood sugars in the morning He will however need to be seen by the diabetes educator in 3 to 4 weeks to assess his progress, review his diet and make sure he is on the correct regimen Also he can take additional 5 to 10 units Humalog if Premeal blood sugars are more than 180 Continue Ozempic and Farxiga Restart walking when he can Try to avoid excessive snacks and carbohydrates  Hypertension: Blood pressure well controlled      Patient Instructions  Toujeo 48 in am and add 20 at supper   Check blood sugars on waking up   Also check blood sugars about 2 hours after meals and do this after different  meals by rotation  Recommended blood sugar levels on waking up are 90-130 and about 2 hours after meal is 130-160  Please bring your blood sugar monitor to each visit, thank you    Time for evaluation and management and total visit time including counseling = 30 minutes    Elayne Snare 10/30/2021, 8:56 PM           ..

## 2021-10-28 NOTE — Patient Instructions (Signed)
Toujeo 48 in am and add 20 at supper   Check blood sugars on waking up   Also check blood sugars about 2 hours after meals and do this after different meals by rotation  Recommended blood sugar levels on waking up are 90-130 and about 2 hours after meal is 130-160  Please bring your blood sugar monitor to each visit, thank you

## 2021-10-29 ENCOUNTER — Other Ambulatory Visit: Payer: Self-pay | Admitting: Internal Medicine

## 2021-10-29 DIAGNOSIS — B20 Human immunodeficiency virus [HIV] disease: Secondary | ICD-10-CM

## 2021-10-31 ENCOUNTER — Other Ambulatory Visit: Payer: Self-pay

## 2021-10-31 DIAGNOSIS — B20 Human immunodeficiency virus [HIV] disease: Secondary | ICD-10-CM

## 2021-10-31 MED ORDER — ODEFSEY 200-25-25 MG PO TABS: 1.0000 | ORAL_TABLET | Freq: Every day | ORAL | 3 refills | Status: DC

## 2021-11-01 ENCOUNTER — Ambulatory Visit: Payer: 59 | Admitting: Endocrinology

## 2021-11-01 ENCOUNTER — Telehealth: Payer: Self-pay | Admitting: Internal Medicine

## 2021-11-01 ENCOUNTER — Encounter: Payer: Self-pay | Admitting: Endocrinology

## 2021-11-01 NOTE — Telephone Encounter (Signed)
Pt called to request a personal phone call from Dr. [name]. When asked what it is regarding pt stated "it's about my health, can I just talk to him please".  Confirmed pt phone [phone]

## 2021-11-03 ENCOUNTER — Ambulatory Visit (INDEPENDENT_AMBULATORY_CARE_PROVIDER_SITE_OTHER): Payer: 59 | Admitting: Podiatry

## 2021-11-03 ENCOUNTER — Ambulatory Visit (INDEPENDENT_AMBULATORY_CARE_PROVIDER_SITE_OTHER): Payer: 59

## 2021-11-03 DIAGNOSIS — Z89419 Acquired absence of unspecified great toe: Secondary | ICD-10-CM | POA: Diagnosis not present

## 2021-11-03 DIAGNOSIS — M869 Osteomyelitis, unspecified: Secondary | ICD-10-CM

## 2021-11-03 NOTE — Progress Notes (Signed)
  Subjective:  Patient ID: Nicholas Whitehead, male    DOB: 11-28-1955,  MRN: [id number]  Chief Complaint  Patient presents with   Routine Post Op    POV #3 DOS 09/23/2021 PARTIAL AMPUTATION GREAT TOE LT       66 y.o. male returns for post-op check.  He is doing okay  Review of Systems: Negative except as noted in the HPI. Denies N/V/F/Ch.   Objective:  There were no vitals filed for this visit. There is no height or weight on file to calculate BMI. Constitutional Well developed. Well nourished.  Vascular Foot warm and well perfused. Capillary refill normal to all digits.  Calf is soft and supple, no posterior calf or knee pain, negative Homans' sign  Neurologic Normal speech. Oriented to person, place, and time. Epicritic sensation to light touch grossly present bilaterally.  Dermatologic Incision is well-healed and nonhypertrophic  Orthopedic: He has no tenderness to palpation noted about the surgical site.  Some numbness in the tips of the lesser toes which are contracted    Radiographs show no acute osseous changes or abnormalities Assessment:   1. Osteomyelitis of great toe of left foot ([place])   2. History of amputation of great toe [facility])    Plan:  Patient was evaluated and treated and all questions answered.  S/p foot surgery left -Overall doing well he may continue regular shoe gear and activity.  I dispensed a silicone toe crest today to offload the lesser toes.  We discussed previously the option of a flexor tenotomy, he will return as needed if the toe crest is unsuccessful in reducing his pain or if he begins to develop any callus or preulcerative calluses.  Return if symptoms worsen or fail to improve.

## 2021-11-03 NOTE — Patient Instructions (Signed)
The pad I gave you is called a toe crest.  More silicone pads can be purchased from:  [email or web address]   OR you can buy them here from our office or on [address]. Some pharmacies may have them

## 2021-11-04 ENCOUNTER — Ambulatory Visit: Payer: 59 | Admitting: Endocrinology

## 2021-11-14 ENCOUNTER — Other Ambulatory Visit: Payer: Self-pay | Admitting: Internal Medicine

## 2021-11-14 DIAGNOSIS — F409 Phobic anxiety disorder, unspecified: Secondary | ICD-10-CM

## 2021-11-15 ENCOUNTER — Telehealth: Payer: Self-pay | Admitting: Internal Medicine

## 2021-11-15 ENCOUNTER — Other Ambulatory Visit: Payer: Self-pay | Admitting: Internal Medicine

## 2021-11-15 DIAGNOSIS — F409 Phobic anxiety disorder, unspecified: Secondary | ICD-10-CM

## 2021-11-15 MED ORDER — QUVIVIQ 50 MG PO TABS: 1.0000 | ORAL_TABLET | Freq: Every day | ORAL | 1 refills | Status: DC

## 2021-11-15 NOTE — Telephone Encounter (Signed)
Patient called back and said that the new medication (Daridorexant HCl Dwana Curd ) needs a prior authorization

## 2021-11-15 NOTE — Telephone Encounter (Signed)
Patient's pharmacy is out of [place] - Patient would like something else called in today in it's place.  Please send to [facility] on [address].

## 2021-11-16 NOTE — Telephone Encounter (Signed)
Key: [id number]

## 2021-11-25 ENCOUNTER — Other Ambulatory Visit: Payer: Self-pay | Admitting: Internal Medicine

## 2021-11-25 DIAGNOSIS — E785 Hyperlipidemia, unspecified: Secondary | ICD-10-CM

## 2021-12-08 ENCOUNTER — Ambulatory Visit (INDEPENDENT_AMBULATORY_CARE_PROVIDER_SITE_OTHER): Payer: 59 | Admitting: Internal Medicine

## 2021-12-08 ENCOUNTER — Encounter: Payer: Self-pay | Admitting: Internal Medicine

## 2021-12-08 VITALS — BP 100/60 | HR 80 | Temp 98.8°F | Ht 73.0 in | Wt 192.0 lb

## 2021-12-08 DIAGNOSIS — F5105 Insomnia due to other mental disorder: Secondary | ICD-10-CM | POA: Diagnosis not present

## 2021-12-08 DIAGNOSIS — N400 Enlarged prostate without lower urinary tract symptoms: Secondary | ICD-10-CM

## 2021-12-08 DIAGNOSIS — F409 Phobic anxiety disorder, unspecified: Secondary | ICD-10-CM

## 2021-12-08 DIAGNOSIS — F330 Major depressive disorder, recurrent, mild: Secondary | ICD-10-CM | POA: Diagnosis not present

## 2021-12-08 DIAGNOSIS — I10 Essential (primary) hypertension: Secondary | ICD-10-CM | POA: Diagnosis not present

## 2021-12-08 LAB — CBC WITH DIFFERENTIAL/PLATELET
Basophils Absolute: 0 10*3/uL (ref 0.0–0.1)
Basophils Relative: 0.2 % (ref 0.0–3.0)
Eosinophils Absolute: 0.1 10*3/uL (ref 0.0–0.7)
Eosinophils Relative: 1.3 % (ref 0.0–5.0)
HCT: 47 % (ref 39.0–52.0)
Hemoglobin: 15.8 g/dL (ref 13.0–17.0)
Lymphocytes Relative: 29.5 % (ref 12.0–46.0)
Lymphs Abs: 2.7 10*3/uL (ref 0.7–4.0)
MCHC: 33.6 g/dL (ref 30.0–36.0)
MCV: 98.5 fl (ref 78.0–100.0)
Monocytes Absolute: 0.8 10*3/uL (ref 0.1–1.0)
Monocytes Relative: 9.1 % (ref 3.0–12.0)
Neutro Abs: 5.4 10*3/uL (ref 1.4–7.7)
Neutrophils Relative %: 59.9 % (ref 43.0–77.0)
Platelets: 192 10*3/uL (ref 150.0–400.0)
RBC: 4.78 Mil/uL (ref 4.22–5.81)
RDW: 14 % (ref 11.5–15.5)
WBC: 9 10*3/uL (ref 4.0–10.5)

## 2021-12-08 LAB — TSH: TSH: 1.05 u[IU]/mL (ref 0.35–5.50)

## 2021-12-08 LAB — HEPATIC FUNCTION PANEL
ALT: 21 U/L (ref 0–53)
AST: 19 U/L (ref 0–37)
Albumin: 4.5 g/dL (ref 3.5–5.2)
Alkaline Phosphatase: 52 U/L (ref 39–117)
Bilirubin, Direct: 0.1 mg/dL (ref 0.0–0.3)
Total Bilirubin: 0.3 mg/dL (ref 0.2–1.2)
Total Protein: 8 g/dL (ref 6.0–8.3)

## 2021-12-08 LAB — PSA: PSA: 1.72 ng/mL (ref 0.10–4.00)

## 2021-12-08 MED ORDER — MIRTAZAPINE 7.5 MG PO TABS: 7.5000 mg | ORAL_TABLET | Freq: Every day | ORAL | 0 refills | Status: DC

## 2021-12-08 NOTE — Patient Instructions (Signed)

## 2021-12-08 NOTE — Progress Notes (Signed)
 Subjective:  Patient ID: Nicholas Whitehead, male    DOB: 01/20/1955  Age: 66 y.o. MRN: [id number]  CC: Depression   HPI [name] presents for f/up -  He complains of sadness, insomnia, and anhedonia. He is active and denies DOE, CP, SOB, edema.  Outpatient Medications Prior to Visit  Medication Sig Dispense Refill   albuterol (VENTOLIN HFA) 108 (90 Base) MCG/ACT inhaler Inhale 2 puffs into the lungs every 6 (six) hours as needed for wheezing or shortness of breath. 1 each 5   Alcohol Swabs (B-D SINGLE USE SWABS REGULAR) PADS USE AS DIRECTED 300 each 1   aspirin EC 81 MG tablet Take 81 mg by mouth every morning.     cetirizine (ZYRTEC) 10 MG tablet Take 10 mg by mouth daily.     Cholecalciferol 2000 units TABS Take 1 tablet (2,000 Units total) by mouth daily. 90 tablet 1   doxycycline (VIBRA-TABS) 100 MG tablet Take 1 tablet (100 mg total) by mouth 2 (two) times daily. 20 tablet 0   DULoxetine (CYMBALTA) 60 MG capsule TAKE 1 CAPSULE(60 MG) BY MOUTH DAILY 90 capsule 1   emtricitabine-rilpivir-tenofovir AF (ODEFSEY) 200-25-25 MG TABS tablet Take 1 tablet by mouth daily. 90 tablet 3   FARXIGA 10 MG TABS tablet TAKE 1 TABLET(10 MG) BY MOUTH DAILY BEFORE AND BREAKFAST 90 tablet 1   gabapentin (NEURONTIN) 300 MG capsule TAKE 1 CAPSULE BY MOUTH THREE TIMES DAILY 270 capsule 1   Glucagon (GVOKE HYPOPEN 2-PACK) 1 MG/0.2ML SOAJ Inject 1 Act into the skin daily as needed. 2 mL 6   glucose blood (ACCU-CHEK GUIDE) test strip TEST BLOOD SUGAR FOUR TIMES DAILY 400 strip 3   Glycopyrrolate-Formoterol (BEVESPI AEROSPHERE) 9-4.8 MCG/ACT AERO Inhale 2 puffs into the lungs 2 (two) times daily. 32.1 g 1   HUMALOG KWIKPEN 100 UNIT/ML KwikPen ADMINISTER 25 TO 30 UNITS UNDER THE SKIN THREE TIMES DAILY 30 mL 3   insulin glargine, 1 Unit Dial, (TOUJEO SOLOSTAR) 300 UNIT/ML Solostar Pen Inject 44 units daily 6 mL 3   Insulin Pen Needle (B-D ULTRAFINE III SHORT PEN) 31G X 8 MM MISC USE TO INJECT INSULIN AS  DIRECTED 500 each 1   Lancets MISC Use to test blood sugar tid. DX: E11.8 300 each 3   mupirocin ointment (BACTROBAN) 2 % Apply 1 Application topically 2 (two) times daily. 30 g 2   Nebivolol HCl 20 MG TABS TAKE 1 TABLET(20 MG) BY MOUTH DAILY 90 tablet 1   olmesartan (BENICAR) 40 MG tablet TAKE 1 TABLET(40 MG) BY MOUTH DAILY 90 tablet 1   pioglitazone (ACTOS) 30 MG tablet TAKE 1 TABLET(30 MG) BY MOUTH DAILY 90 tablet 1   Pitavastatin Calcium (LIVALO) 1 MG TABS TAKE 1 TABLET(1 MG) BY MOUTH DAILY 90 tablet 1   Semaglutide, 1 MG/DOSE, (OZEMPIC, 1 MG/DOSE,) 4 MG/3ML SOPN Inject 1 mg weekly 3 mL 5   torsemide (DEMADEX) 20 MG tablet TAKE 1 TABLET(20 MG) BY MOUTH DAILY (Patient taking differently: Take 20 mg by mouth every Monday, Wednesday, and Friday.) 90 tablet 1   traZODone (DESYREL) 150 MG tablet TAKE 3 TABLETS(450 MG) BY MOUTH AT BEDTIME 270 tablet 1   Daridorexant HCl (QUVIVIQ) 50 MG TABS Take 1 tablet by mouth daily. 90 tablet 1   Glucagon, rDNA, (GLUCAGON EMERGENCY) 1 MG KIT Inject 1 mg into the skin once for 1 dose. 1 kit 5   No facility-administered medications prior to visit.    ROS Review of Systems  Constitutional: Negative.    Negative for diaphoresis and fatigue.  HENT: Negative.    Eyes: Negative.   Respiratory: Negative.  Negative for cough, shortness of breath and wheezing.   Cardiovascular:  Negative for chest pain, palpitations and leg swelling.  Gastrointestinal:  Negative for abdominal pain, diarrhea, nausea and vomiting.  Genitourinary: Negative.  Negative for difficulty urinating.  Musculoskeletal: Negative.   Skin: Negative.   Neurological: Negative.  Negative for dizziness and weakness.  Hematological:  Negative for adenopathy. Does not bruise/bleed easily.  Psychiatric/Behavioral:  Positive for dysphoric mood and sleep disturbance. Negative for confusion, decreased concentration and suicidal ideas. The patient is not nervous/anxious.     Objective:  BP 100/60 (BP  Location: Left Arm, Patient Position: Sitting, Cuff Size: Large)   Pulse 80   Temp 98.8 F (37.1 C) (Oral)   Ht 6' 1" (1.854 m)   Wt 192 lb (87.1 kg)   SpO2 93%   BMI 25.33 kg/m   BP Readings from Last 3 Encounters:  12/08/21 100/60  10/28/21 114/70  09/22/21 112/64    Wt Readings from Last 3 Encounters:  12/08/21 192 lb (87.1 kg)  10/28/21 189 lb 12.8 oz (86.1 kg)  09/22/21 188 lb (85.3 kg)    Physical Exam Vitals reviewed.  HENT:     Mouth/Throat:     Mouth: Mucous membranes are moist.  Eyes:     General: No scleral icterus.    Conjunctiva/sclera: Conjunctivae normal.  Cardiovascular:     Rate and Rhythm: Normal rate and regular rhythm.     Heart sounds: No murmur heard. Pulmonary:     Effort: Pulmonary effort is normal.     Breath sounds: No stridor. No wheezing, rhonchi or rales.  Abdominal:     General: Abdomen is flat.     Palpations: There is no mass.     Tenderness: There is no abdominal tenderness. There is no guarding.     Hernia: No hernia is present.  Musculoskeletal:        General: Normal range of motion.     Cervical back: Neck supple.     Right lower leg: No edema.     Left lower leg: No edema.  Lymphadenopathy:     Cervical: No cervical adenopathy.  Skin:    General: Skin is warm and dry.  Neurological:     General: No focal deficit present.     Mental Status: He is alert.  Psychiatric:        Attention and Perception: Attention and perception normal.        Mood and Affect: Mood is depressed. Mood is not anxious. Affect is flat.        Speech: Speech normal.        Behavior: Behavior normal.        Thought Content: Thought content normal.        Cognition and Memory: Cognition normal.     Lab Results  Component Value Date   WBC 9.0 12/08/2021   HGB 15.8 12/08/2021   HCT 47.0 12/08/2021   PLT 192.0 12/08/2021   GLUCOSE 224 (H) 10/24/2021   CHOL 128 03/10/2021   TRIG 122.0 03/10/2021   HDL 45.70 03/10/2021   LDLCALC 58  03/10/2021   ALT 21 12/08/2021   AST 19 12/08/2021   NA 137 10/24/2021   K 5.0 10/24/2021   CL 102 10/24/2021   CREATININE 1.34 10/24/2021   BUN 17 10/24/2021   CO2 28 10/24/2021   TSH 1.05 12/08/2021   PSA  1.72 12/08/2021   HGBA1C 8.7 (H) 10/24/2021   MICROALBUR <0.7 04/29/2021    MR TOES LEFT WO CONTRAST  Result Date: 08/12/2021 CLINICAL DATA:  Foot swelling, diabetic, osteomyelitis suspected, xray done EXAM: MRI OF THE LEFT TOES WITHOUT CONTRAST TECHNIQUE: Multiplanar, multisequence MR imaging of the left toes was performed. No intravenous contrast was administered. COMPARISON:  Foot radiograph 08/04/2021 FINDINGS: Bones/Joint/Cartilage There is marrow edema within the distal phalanx of the great toe, with low T1 signal in the distal tuft. No other significant marrow signal alteration. Ligaments Intact Lisfranc ligament. Intact MTP collateral ligaments. No evidence of plantar plate tear. Muscles and Tendons No acute tendon tear. There is intramuscular edema and atrophy in the forefoot, likely denervation change. Soft tissues There is generalized soft tissue swelling of the foot. No focal fluid collection. IMPRESSION: Marrow edema within the distal phalanx of the great toe with confluent low T1 signal in the distal tuft, consistent with osteomyelitis. No evidence of soft tissue abscess. These results will be called to the ordering clinician or representative by the Radiologist Assistant, and communication documented in the PACS or [facility] Dashboard. Electronically Signed   By: Jacob  Kahn M.D.   On: 08/12/2021 16:16    Assessment & Plan:   Tarin was seen today for depression.  Diagnoses and all orders for this visit:  Insomnia due to anxiety and fear -     mirtazapine (REMERON) 7.5 MG tablet; Take 1 tablet (7.5 mg total) by mouth at bedtime. -     TSH; Future -     TSH  Mild episode of recurrent major depressive disorder (HCC)- Will add remeron to duloxetine. -     mirtazapine  (REMERON) 7.5 MG tablet; Take 1 tablet (7.5 mg total) by mouth at bedtime. -     TSH; Future -     TSH  Primary hypertension- His BP is well controlled. -     TSH; Future -     Hepatic function panel; Future -     CBC with Differential/Platelet; Future -     CBC with Differential/Platelet -     Hepatic function panel -     TSH  Benign prostatic hyperplasia without lower urinary tract symptoms- PSA is normal. -     PSA; Future -     PSA   I have discontinued Arty Vazques's Quviviq. I am also having him start on mirtazapine. Additionally, I am having him maintain his cetirizine, aspirin EC, Cholecalciferol, Lancets, Accu-Chek Guide, B-D SINGLE USE SWABS REGULAR, Gvoke HypoPen 2-Pack, B-D ULTRAFINE III SHORT PEN, traZODone, Nebivolol HCl, torsemide, gabapentin, Bevespi Aerosphere, albuterol, HumaLOG KwikPen, Farxiga, Ozempic (1 MG/DOSE), pioglitazone, mupirocin ointment, doxycycline, Glucagon Emergency, olmesartan, DULoxetine, Toujeo SoloStar, Odefsey, and Livalo.  Meds ordered this encounter  Medications   mirtazapine (REMERON) 7.5 MG tablet    Sig: Take 1 tablet (7.5 mg total) by mouth at bedtime.    Dispense:  90 tablet    Refill:  0     Follow-up: Return in about 3 months (around 03/09/2022).   , MD 

## 2021-12-09 ENCOUNTER — Encounter: Payer: Self-pay | Admitting: Internal Medicine

## 2021-12-19 ENCOUNTER — Ambulatory Visit: Payer: 59 | Admitting: Dietician

## 2021-12-20 ENCOUNTER — Encounter: Payer: 59 | Attending: Internal Medicine | Admitting: Nutrition

## 2022-01-05 ENCOUNTER — Other Ambulatory Visit: Payer: Self-pay | Admitting: Internal Medicine

## 2022-01-05 DIAGNOSIS — I10 Essential (primary) hypertension: Secondary | ICD-10-CM

## 2022-01-05 DIAGNOSIS — E1140 Type 2 diabetes mellitus with diabetic neuropathy, unspecified: Secondary | ICD-10-CM

## 2022-01-17 ENCOUNTER — Telehealth: Payer: Self-pay | Admitting: Internal Medicine

## 2022-01-17 ENCOUNTER — Other Ambulatory Visit: Payer: Self-pay | Admitting: Internal Medicine

## 2022-01-17 DIAGNOSIS — Z794 Long term (current) use of insulin: Secondary | ICD-10-CM

## 2022-01-17 DIAGNOSIS — E1140 Type 2 diabetes mellitus with diabetic neuropathy, unspecified: Secondary | ICD-10-CM

## 2022-01-17 MED ORDER — FREESTYLE LIBRE 3 SENSOR MISC: 1.0000 | Freq: Every day | 5 refills | Status: DC

## 2022-01-17 NOTE — Telephone Encounter (Signed)
Caller & Relationship to patient: Patient   Call back [phone]   Date of last office visit:   Date of next office visit:  02/29/1954   Medication(s) to be refilled:  Free Style Libre - sensors        Preferred Pharmacy:   [facility] [address]

## 2022-01-22 ENCOUNTER — Other Ambulatory Visit: Payer: Self-pay | Admitting: Internal Medicine

## 2022-01-22 DIAGNOSIS — E119 Type 2 diabetes mellitus without complications: Secondary | ICD-10-CM

## 2022-01-22 MED ORDER — FREESTYLE LIBRE 3 READER DEVI: 1.0000 | Freq: Every day | 5 refills | Status: DC

## 2022-01-24 ENCOUNTER — Other Ambulatory Visit: Payer: Self-pay

## 2022-01-24 ENCOUNTER — Other Ambulatory Visit: Payer: Medicare Other

## 2022-01-24 DIAGNOSIS — B20 Human immunodeficiency virus [HIV] disease: Secondary | ICD-10-CM

## 2022-01-24 DIAGNOSIS — Z113 Encounter for screening for infections with a predominantly sexual mode of transmission: Secondary | ICD-10-CM

## 2022-01-24 DIAGNOSIS — E785 Hyperlipidemia, unspecified: Secondary | ICD-10-CM

## 2022-01-24 DIAGNOSIS — Z79899 Other long term (current) drug therapy: Secondary | ICD-10-CM | POA: Diagnosis not present

## 2022-01-25 LAB — T-HELPER CELL (CD4) - (RCID CLINIC ONLY)
CD4 % Helper T Cell: 37 % (ref 33–65)
CD4 T Cell Abs: 643 /uL (ref 400–1790)

## 2022-01-26 LAB — COMPLETE METABOLIC PANEL WITH GFR
AG Ratio: 1.2 (calc) (ref 1.0–2.5)
ALT: 22 U/L (ref 9–46)
AST: 22 U/L (ref 10–35)
Albumin: 4.7 g/dL (ref 3.6–5.1)
Alkaline phosphatase (APISO): 53 U/L (ref 35–144)
BUN: 13 mg/dL (ref 7–25)
CO2: 28 mmol/L (ref 20–32)
Calcium: 9.5 mg/dL (ref 8.6–10.3)
Chloride: 102 mmol/L (ref 98–110)
Creat: 1.06 mg/dL (ref 0.70–1.35)
Globulin: 3.9 g/dL (calc) — ABNORMAL HIGH (ref 1.9–3.7)
Glucose, Bld: 237 mg/dL — ABNORMAL HIGH (ref 65–99)
Potassium: 4.2 mmol/L (ref 3.5–5.3)
Sodium: 137 mmol/L (ref 135–146)
Total Bilirubin: 0.4 mg/dL (ref 0.2–1.2)
Total Protein: 8.6 g/dL — ABNORMAL HIGH (ref 6.1–8.1)
eGFR: 78 mL/min/{1.73_m2} (ref >=60)

## 2022-01-26 LAB — HIV-1 RNA QUANT-NO REFLEX-BLD
HIV 1 RNA Quant: NOT DETECTED Copies/mL
HIV-1 RNA Quant, Log: NOT DETECTED Log cps/mL

## 2022-01-26 LAB — LIPID PANEL
Cholesterol: 165 mg/dL (ref <200)
HDL: 43 mg/dL (ref >=40)
LDL Cholesterol (Calc): 95 mg/dL (calc)
Non-HDL Cholesterol (Calc): 122 mg/dL (calc) (ref <130)
Total CHOL/HDL Ratio: 3.8 (calc) (ref <5.0)
Triglycerides: 170 mg/dL — ABNORMAL HIGH (ref <150)

## 2022-01-26 LAB — RPR: RPR Ser Ql: NONREACTIVE

## 2022-01-29 ENCOUNTER — Other Ambulatory Visit: Payer: Self-pay | Admitting: Internal Medicine

## 2022-01-29 DIAGNOSIS — E1140 Type 2 diabetes mellitus with diabetic neuropathy, unspecified: Secondary | ICD-10-CM

## 2022-01-31 ENCOUNTER — Ambulatory Visit (INDEPENDENT_AMBULATORY_CARE_PROVIDER_SITE_OTHER): Payer: Medicare Other

## 2022-01-31 VITALS — Ht 73.0 in | Wt 192.0 lb

## 2022-01-31 DIAGNOSIS — Z0000 Encounter for general adult medical examination without abnormal findings: Secondary | ICD-10-CM

## 2022-01-31 NOTE — Patient Instructions (Addendum)
Nicholas Whitehead , Thank you for taking time to come for your Medicare Wellness Visit. I appreciate your ongoing commitment to your health goals. Please review the following plan we discussed and let me know if I can assist you in the future.   These are the goals we discussed:  Goals       Manage My Medicine      Timeframe:  Long-Range Goal Priority:  High Start Date:   02/20/20                          Expected End Date:   04/29/2022            Follow Up Date 10/2021   - call for medicine refill 2 or 3 days before it runs out - call if I am sick and can't take my medicine - keep a list of all the medicines I take; vitamins and herbals too - use a pillbox to sort medicine    Why is this important?   These steps will help you keep on track with your medicines.      Patient Stated      Patient Stated (pt-stated)      Maintain my health the very best I can.        This is a list of the screening recommended for you and due dates:  Health Maintenance  Topic Date Due   Zoster (Shingles) Vaccine (1 of 2) 05/02/2022*   Hemoglobin A1C  04/25/2022   Colon Cancer Screening  04/25/2022   Yearly kidney health urinalysis for diabetes  04/30/2022   Eye exam for diabetics  08/17/2022   Complete foot exam   09/23/2022   Yearly kidney function blood test for diabetes  01/25/2023   Medicare Annual Wellness Visit  02/01/2023   DTaP/Tdap/Td vaccine (2 - Td or Tdap) 05/22/2023   Flu Shot  Completed   Hepatitis C Screening: USPSTF Recommendation to screen - Ages 18-79 yo.  Completed   HIV Screening  Completed   HPV Vaccine  Aged Out   COVID-19 Vaccine  Discontinued  *Topic was postponed. The date shown is not the original due date.    Advanced directives: Please bring a copy of your health care power of attorney and living will to the office to be added to your chart at your convenience.   Conditions/risks identified: None  Next appointment: Follow up in one year for your annual wellness  visit    Preventive Care 40-64 Years, Male Preventive care refers to lifestyle choices and visits with your health care provider that can promote health and wellness. What does preventive care include? A yearly physical exam. This is also called an annual well check. Dental exams once or twice a year. Routine eye exams. Ask your health care provider how often you should have your eyes checked. Personal lifestyle choices, including: Daily care of your teeth and gums. Regular physical activity. Eating a healthy diet. Avoiding tobacco and drug use. Limiting alcohol use. Practicing safe sex. Taking low-dose aspirin every day starting at age 60. What happens during an annual well check? The services and screenings done by your health care provider during your annual well check will depend on your age, overall health, lifestyle risk factors, and family history of disease. Counseling  Your health care provider may ask you questions about your: Alcohol use. Tobacco use. Drug use. Emotional well-being. Home and relationship well-being. Sexual activity. Eating habits. Work and work  environment. Screening  You may have the following tests or measurements: Height, weight, and BMI. Blood pressure. Lipid and cholesterol levels. These may be checked every 5 years, or more frequently if you are over 22 years old. Skin check. Lung cancer screening. You may have this screening every year starting at age 54 if you have a 30-pack-year history of smoking and currently smoke or have quit within the past 15 years. Fecal occult blood test (FOBT) of the stool. You may have this test every year starting at age 61. Flexible sigmoidoscopy or colonoscopy. You may have a sigmoidoscopy every 5 years or a colonoscopy every 10 years starting at age 61. Prostate cancer screening. Recommendations will vary depending on your family history and other risks. Hepatitis C blood test. Hepatitis B blood  test. Sexually transmitted disease (STD) testing. Diabetes screening. This is done by checking your blood sugar (glucose) after you have not eaten for a while (fasting). You may have this done every 1-3 years. Discuss your test results, treatment options, and if necessary, the need for more tests with your health care provider. Vaccines  Your health care provider may recommend certain vaccines, such as: Influenza vaccine. This is recommended every year. Tetanus, diphtheria, and acellular pertussis (Tdap, Td) vaccine. You may need a Td booster every 10 years. Zoster vaccine. You may need this after age 31. Pneumococcal 13-valent conjugate (PCV13) vaccine. You may need this if you have certain conditions and have not been vaccinated. Pneumococcal polysaccharide (PPSV23) vaccine. You may need one or two doses if you smoke cigarettes or if you have certain conditions. Talk to your health care provider about which screenings and vaccines you need and how often you need them. This information is not intended to replace advice given to you by your health care provider. Make sure you discuss any questions you have with your health care provider. Document Released: 01/22/2015 Document Revised: 09/15/2015 Document Reviewed: 10/27/2014 Elsevier Interactive Patient Education  2017 [place] Prevention in the Home Falls can cause injuries. They can happen to people of all ages. There are many things you can do to make your home safe and to help prevent falls. What can I do on the outside of my home? Regularly fix the edges of walkways and driveways and fix any cracks. Remove anything that might make you trip as you walk through a door, such as a raised step or threshold. Trim any bushes or trees on the path to your home. Use bright outdoor lighting. Clear any walking paths of anything that might make someone trip, such as rocks or tools. Regularly check to see if handrails are loose or broken.  Make sure that both sides of any steps have handrails. Any raised decks and porches should have guardrails on the edges. Have any leaves, snow, or ice cleared regularly. Use sand or salt on walking paths during winter. Clean up any spills in your garage right away. This includes oil or grease spills. What can I do in the bathroom? Use night lights. Install grab bars by the toilet and in the tub and shower. Do not use towel bars as grab bars. Use non-skid mats or decals in the tub or shower. If you need to sit down in the shower, use a plastic, non-slip stool. Keep the floor dry. Clean up any water that spills on the floor as soon as it happens. Remove soap buildup in the tub or shower regularly. Attach bath mats securely with double-sided non-slip rug  tape. Do not have throw rugs and other things on the floor that can make you trip. What can I do in the bedroom? Use night lights. Make sure that you have a light by your bed that is easy to reach. Do not use any sheets or blankets that are too big for your bed. They should not hang down onto the floor. Have a firm chair that has side arms. You can use this for support while you get dressed. Do not have throw rugs and other things on the floor that can make you trip. What can I do in the kitchen? Clean up any spills right away. Avoid walking on wet floors. Keep items that you use a lot in easy-to-reach places. If you need to reach something above you, use a strong step stool that has a grab bar. Keep electrical cords out of the way. Do not use floor polish or wax that makes floors slippery. If you must use wax, use non-skid floor wax. Do not have throw rugs and other things on the floor that can make you trip. What can I do with my stairs? Do not leave any items on the stairs. Make sure that there are handrails on both sides of the stairs and use them. Fix handrails that are broken or loose. Make sure that handrails are as long as the  stairways. Check any carpeting to make sure that it is firmly attached to the stairs. Fix any carpet that is loose or worn. Avoid having throw rugs at the top or bottom of the stairs. If you do have throw rugs, attach them to the floor with carpet tape. Make sure that you have a light switch at the top of the stairs and the bottom of the stairs. If you do not have them, ask someone to add them for you. What else can I do to help prevent falls? Wear shoes that: Do not have high heels. Have rubber bottoms. Are comfortable and fit you well. Are closed at the toe. Do not wear sandals. If you use a stepladder: Make sure that it is fully opened. Do not climb a closed stepladder. Make sure that both sides of the stepladder are locked into place. Ask someone to hold it for you, if possible. Clearly mark and make sure that you can see: Any grab bars or handrails. First and last steps. Where the edge of each step is. Use tools that help you move around (mobility aids) if they are needed. These include: Canes. Walkers. Scooters. Crutches. Turn on the lights when you go into a dark area. Replace any light bulbs as soon as they burn out. Set up your furniture so you have a clear path. Avoid moving your furniture around. If any of your floors are uneven, fix them. If there are any pets around you, be aware of where they are. Review your medicines with your doctor. Some medicines can make you feel dizzy. This can increase your chance of falling. Ask your doctor what other things that you can do to help prevent falls. This information is not intended to replace advice given to you by your health care provider. Make sure you discuss any questions you have with your health care provider. Document Released: 10/22/2008 Document Revised: 06/03/2015 Document Reviewed: 01/30/2014 Elsevier Interactive Patient Education  2017 [employer].

## 2022-01-31 NOTE — Progress Notes (Signed)
Subjective:   Nicholas Whitehead is a 67 y.o. male who presents for Medicare Annual/Subsequent preventive examination.  Review of Systems    Virtual Visit via Telephone Note  I connected with  Nicholas Whitehead on 01/31/22 at  9:30 AM EST by telephone and verified that I am speaking with the correct person using two identifiers.  Location: Patient: Home Provider: Office Persons participating in the virtual visit: patient/Nurse Health Advisor   I discussed the limitations, risks, security and privacy concerns of performing an evaluation and management service by telephone and the availability of in person appointments. The patient expressed understanding and agreed to proceed.  Interactive audio and video telecommunications were attempted between this nurse and patient, however failed, due to patient having technical difficulties OR patient did not have access to video capability.  We continued and completed visit with audio only.  Some vital signs may be absent or patient reported.   Nicholas Peaches, LPN  Cardiac Risk Factors include: advanced age (>67mn, >>69women);diabetes mellitus;hypertension;male gender     Objective:    Today's Vitals   01/31/22 0918  Weight: 192 lb (87.1 kg)  Height: '6\' 1"'$  (1.854 m)   Body mass index is 25.33 kg/m.     01/31/2022    9:29 AM 01/21/2021    3:35 PM 10/27/2019   12:53 PM 10/25/2018    1:11 PM 03/14/2018   12:07 PM 02/25/2018    3:52 PM 09/11/2016    1:44 PM  Advanced Directives  Does Patient Have a Medical Advance Directive? No;Yes Yes No Yes No No No;Yes  Type of AParamedicof AFort DavisLiving will Living will;Healthcare Power of [place] will     Does patient want to make changes to medical advance directive?  No - Patient declined       Copy of [place] Chart?  No - copy requested  No - copy requested     Would patient like information on creating a medical  advance directive? No - Patient declined  No - Patient declined  No - Patient declined No - Patient declined     Current Medications (verified) Outpatient Encounter Medications as of 01/31/2022  Medication Sig   Continuous Blood Gluc Sensor (FREESTYLE LIBRE 3 SENSOR) MISC 1 Act by Does not apply route daily. Place 1 sensor on the skin every 14 days. Use to check glucose continuously   albuterol (VENTOLIN HFA) 108 (90 Base) MCG/ACT inhaler Inhale 2 puffs into the lungs every 6 (six) hours as needed for wheezing or shortness of breath.   Alcohol Swabs (B-D SINGLE USE SWABS REGULAR) PADS USE AS DIRECTED   aspirin EC 81 MG tablet Take 81 mg by mouth every morning.   cetirizine (ZYRTEC) 10 MG tablet Take 10 mg by mouth daily.   Cholecalciferol 2000 units TABS Take 1 tablet (2,000 Units total) by mouth daily.   Continuous Blood Gluc Receiver (FREESTYLE LIBRE 3 READER) DEVI 1 Act by Does not apply route daily.   dapagliflozin propanediol (FARXIGA) 10 MG TABS tablet TAKE 1 TABLET BY MOUTH DAILY BEFORE BREAKFAST   DULoxetine (CYMBALTA) 60 MG capsule TAKE 1 CAPSULE(60 MG) BY MOUTH DAILY   emtricitabine-rilpivir-tenofovir AF (ODEFSEY) 200-25-25 MG TABS tablet Take 1 tablet by mouth daily.   gabapentin (NEURONTIN) 300 MG capsule TAKE 1 CAPSULE BY MOUTH THREE TIMES DAILY   Glucagon (GVOKE HYPOPEN 2-PACK) 1 MG/0.2ML SOAJ Inject 1 Act into the skin daily as needed.  Glucagon, rDNA, (GLUCAGON EMERGENCY) 1 MG KIT Inject 1 mg into the skin once for 1 dose.   glucose blood (ACCU-CHEK GUIDE) test strip TEST BLOOD SUGAR FOUR TIMES DAILY   Glycopyrrolate-Formoterol (BEVESPI AEROSPHERE) 9-4.8 MCG/ACT AERO Inhale 2 puffs into the lungs 2 (two) times daily.   HUMALOG KWIKPEN 100 UNIT/ML KwikPen ADMINISTER 25 TO 30 UNITS UNDER THE SKIN THREE TIMES DAILY   insulin glargine, 1 Unit Dial, (TOUJEO SOLOSTAR) 300 UNIT/ML Solostar Pen Inject 44 units daily   Insulin Pen Needle (B-D ULTRAFINE III SHORT PEN) 31G X 8 MM MISC USE  TO INJECT INSULIN AS DIRECTED   Lancets MISC Use to test blood sugar tid. DX: E11.8   mirtazapine (REMERON) 7.5 MG tablet Take 1 tablet (7.5 mg total) by mouth at bedtime.   mupirocin ointment (BACTROBAN) 2 % Apply 1 Application topically 2 (two) times daily.   Nebivolol HCl 20 MG TABS TAKE 1 TABLET(20 MG) BY MOUTH DAILY   olmesartan (BENICAR) 40 MG tablet TAKE 1 TABLET(40 MG) BY MOUTH DAILY   pioglitazone (ACTOS) 30 MG tablet TAKE 1 TABLET(30 MG) BY MOUTH DAILY   Pitavastatin Calcium (LIVALO) 1 MG TABS TAKE 1 TABLET(1 MG) BY MOUTH DAILY   Semaglutide, 1 MG/DOSE, (OZEMPIC, 1 MG/DOSE,) 4 MG/3ML SOPN Inject 1 mg weekly   torsemide (DEMADEX) 20 MG tablet Take 1 tablet (20 mg total) by mouth every Monday, Wednesday, and Friday.   traZODone (DESYREL) 150 MG tablet TAKE 3 TABLETS(450 MG) BY MOUTH AT BEDTIME   No facility-administered encounter medications on file as of 01/31/2022.    Allergies (verified) Benazepril, Molds & smuts, Pravastatin, and Glipizide   History: Past Medical History:  Diagnosis Date   Anxiety    Cataract    right    Depression    Diabetes mellitus without complication ([place]ablo)    DM (diabetes mellitus screen)    HIV disease ([place]) 09/12/1995   HIV infection ([place])    Hypercholesterolemia    Hyperlipidemia    Hypertension    Past Surgical History:  Procedure Laterality Date   COLONOSCOPY WITH PROPOFOL  01/16/2012   Procedure: COLONOSCOPY WITH PROPOFOL;  Surgeon: Garlan Fair, MD;  Location: [facility];  Service: Endoscopy;  Laterality: N/A;   EYE SURGERY  2005   catarct surgery   optic lens surgery  2005   Family History  Problem Relation Age of Onset   Arthritis Mother    Diabetes Mother    Heart disease Mother    Stroke Mother    Arthritis Father    Diabetes Father    Heart disease Father    Social History   Socioeconomic History   Marital status: Single    Spouse name: Not on file   Number of children: Not on file   Years of education: Not on  file   Highest education level: Not on file  Occupational History   Occupation: disability  Tobacco Use   Smoking status: Every Day    Packs/day: 0.25    Types: Cigarettes   Smokeless tobacco: Never   Tobacco comments:    Occ smoking/ trying to quit  Vaping Use   Vaping Use: Never used  Substance and Sexual Activity   Alcohol use: No    Alcohol/week: 0.0 standard drinks of alcohol    Comment: socially   Drug use: No   Sexual activity: Never    Birth control/protection: Other-see comments    Comment: declined condoms  Other Topics Concern   Not on file  Social History Narrative   Not on file   Social Determinants of Health   Financial Resource Strain: Low Risk  (01/31/2022)   Overall Financial Resource Strain (CARDIA)    Difficulty of Paying Living Expenses: Not hard at all  Food Insecurity: No Food Insecurity (01/31/2022)   Hunger Vital Sign    Worried About Running Out of Food in the Last Year: Never true    Ran Out of Food in the Last Year: Never true  Transportation Needs: No Transportation Needs (01/31/2022)   PRAPARE - Hydrologist (Medical): No    Lack of Transportation (Non-Medical): No  Physical Activity: Sufficiently Active (01/31/2022)   Exercise Vital Sign    Days of Exercise per Week: 5 days    Minutes of Exercise per Session: 30 min  Stress: No Stress Concern Present (01/31/2022)   [place]    Feeling of Stress : Not at all  Social Connections: Moderately Integrated (01/31/2022)   Social Connection and Isolation Panel [NHANES]    Frequency of Communication with Friends and Family: More than three times a week    Frequency of Social Gatherings with Friends and Family: More than three times a week    Attends Religious Services: More than 4 times per year    Active Member of [employer] or Organizations: Yes    Attends Music therapist: More than 4 times per  year    Marital Status: Never married    Tobacco Counseling Ready to quit: Yes Counseling given: Yes Tobacco comments: Occ smoking/ trying to quit   Clinical Intake:  Pre-visit preparation completed: No  Pain : No/denies pain     Nutrition Risk Assessment:  Has the patient had any N/V/D within the last 2 months?  No  Does the patient have any non-healing wounds?  No  Has the patient had any unintentional weight loss or weight gain?  No   Diabetes:  Is the patient diabetic?  Yes  If diabetic, was a CBG obtained today?  Yes CBG 217 taken by patient Did the patient bring in their glucometer from home?  No  How often do you monitor your CBG's? 3 X daily.   Financial Strains and Diabetes Management:  Are you having any financial strains with the device, your supplies or your medication? No .  Does the patient want to be seen by Chronic Care Management for management of their diabetes?  No  Would the patient like to be referred to a Nutritionist or for Diabetic Management?  No   Diabetic Exams:  Diabetic Eye Exam: Completed No. Overdue for diabetic eye exam. Pt has been advised about the importance in completing this exam. A referral has been placed today. Message sent to referral coordinator for scheduling purposes. Advised pt to expect a call from office referred to regarding appt.  Diabetic Foot Exam: Completed No. Pt has been advised about the importance in completing this exam. Pt is scheduled for diabetic foot exam on Followed by Dr Dwyane Dee    How often do you need to have someone help you when you read instructions, pamphlets, or other written materials from your doctor or pharmacy?: 1 - Never  Diabetic?  Yes  Interpreter Needed?: No  Information entered by :: Rolene Arbour LPN   Activities of Daily Living    01/31/2022    9:27 AM  In your present state of health, do you have any difficulty performing  the following activities:  Hearing? 0  Vision? 0   Difficulty concentrating or making decisions? 0  Walking or climbing stairs? 0  Dressing or bathing? 0  Doing errands, shopping? 0  Preparing Food and eating ? N  Using the Toilet? N  In the past six months, have you accidently leaked urine? N  Do you have problems with loss of bowel control? N  Managing your Medications? N  Managing your Finances? N  Housekeeping or managing your Housekeeping? N    Patient Care Team: [name], MD as PCP - General (Internal Medicine) [name], Okey Regal, MD as PCP - Infectious Diseases (Infectious Diseases) Merita Norton, LCSW as Counselor Calvert Cantor, MD as Consulting Physician (Ophthalmology) Delice Bison, Darnelle Maffucci, [facility] (Inactive) (Pharmacist)  Indicate any recent Medical Services you may have received from other than [facility] providers in the past year (date may be approximate).     Assessment:   This is a routine wellness examination for [place].  Hearing/Vision screen Hearing Screening - Comments:: Denies hearing difficulties   Vision Screening - Comments:: Wears rx glasses - up to date with routine eye exams with  Dr Valetta Close  Dietary issues and exercise activities discussed: Current Exercise Habits: Home exercise routine, Type of exercise: walking, Time (Minutes): 30, Frequency (Times/Week): 5, Weekly Exercise (Minutes/Week): 150, Intensity: Moderate, Exercise limited by: None identified   Goals Addressed               This Visit's Progress     Patient Stated (pt-stated)        Maintain my health the very best I can.       Depression Screen    01/31/2022    9:25 AM 01/21/2021    3:37 PM 01/18/2021   10:51 AM 12/20/2020    9:03 AM 10/27/2019    1:09 PM 10/27/2019    1:06 PM 07/02/2019   10:28 AM  PHQ 2/9 Scores  PHQ - 2 Score 0 4 4 0 '1 1 3  '$ PHQ- 9 Score  14 14        Fall Risk    01/31/2022    9:28 AM 01/18/2021   10:52 AM 07/02/2020    1:45 PM 10/27/2019   12:53 PM 07/02/2019   10:34 AM  Fall Risk   Falls in the past  year? 0 0 0 0 0  Number falls in past yr: 0  0 0   Injury with Fall? 0  0 0   Risk for fall due to : No Fall Risks   No Fall Risks No Fall Risks  Follow up Falls prevention discussed  Falls evaluation completed Falls evaluation completed Falls evaluation completed    Rachel:  Any stairs in or around the home? Yes  If so, are there any without handrails? No  Home free of loose throw rugs in walkways, pet beds, electrical cords, etc? Yes  Adequate lighting in your home to reduce risk of falls? Yes   ASSISTIVE DEVICES UTILIZED TO PREVENT FALLS:  Life alert? No  Use of a cane, walker or w/c? Yes  Grab bars in the bathroom? No Shower chair or bench in shower? Yes  Elevated toilet seat or a handicapped toilet? Yes   TIMED UP AND GO:  Was the test performed? No . Audio Visit    Cognitive Function:    10/25/2018    1:27 PM  MMSE - Mini Mental State Exam  Not completed: Refused  06/28/2015    3:00 PM  Montreal Cognitive Assessment   Visuospatial/ Executive (0/5) 3  Naming (0/3) 3  Attention: Read list of digits (0/2) 2  Attention: Read list of letters (0/1) 1  Attention: Serial 7 subtraction starting at 100 (0/3) 0  Language: Repeat phrase (0/2) 1  Language : Fluency (0/1) 1  Abstraction (0/2) 2  Delayed Recall (0/5) 3  Orientation (0/6) 6  Total 22  Adjusted Score (based on education) 22      01/31/2022    9:29 AM  6CIT Screen  What Year? 0 points  What month? 0 points  What time? 0 points  Count back from 20 0 points  Months in reverse 0 points  Repeat phrase 0 points  Total Score 0 points    Immunizations Immunization History  Administered Date(s) Administered   Hepatitis A 01/29/2002   Influenza Split 09/26/2011   Influenza Whole 10/14/2001, 09/02/2010   Influenza, High Dose Seasonal PF 09/06/2015   Influenza,inj,Quad PF,6+ Mos 09/19/2012, 09/11/2013, 09/22/2014, 03/02/2015, 10/24/2016, 10/23/2017, 09/25/2018,  12/24/2019, 12/20/2020, 09/22/2021   PFIZER(Purple Top)SARS-COV-2 Vaccination 09/30/2019, 10/21/2019   PPD Test 09/14/1995   Pneumococcal Conjugate-13 01/21/2014   Pneumococcal Polysaccharide-23 12/16/2001, 09/02/2010, 09/06/2015, 03/22/2021   Tdap 05/21/2013    TDAP status: Up to date  Flu Vaccine status: Up to date    Covid-19 vaccine status: Completed vaccines  Qualifies for Shingles Vaccine? Yes   Zostavax completed No   Shingrix Completed?: No.    Education has been provided regarding the importance of this vaccine. Patient has been advised to call insurance company to determine out of pocket expense if they have not yet received this vaccine. Advised may also receive vaccine at local pharmacy or Health Dept. Verbalized acceptance and understanding.  Screening Tests Health Maintenance  Topic Date Due   Zoster Vaccines- Shingrix (1 of 2) 05/02/2022 (Originally 11/02/1974)   HEMOGLOBIN A1C  04/25/2022   COLONOSCOPY (Pts 45-96yr Insurance coverage will need to be confirmed)  04/25/2022   Diabetic kidney evaluation - Urine ACR  04/30/2022   OPHTHALMOLOGY EXAM  08/17/2022   FOOT EXAM  09/23/2022   Diabetic kidney evaluation - eGFR measurement  01/25/2023   Medicare Annual Wellness (AWV)  02/01/2023   DTaP/Tdap/Td (2 - Td or Tdap) 05/22/2023   INFLUENZA VACCINE  Completed   Hepatitis C Screening  Completed   HIV Screening  Completed   HPV VACCINES  Aged Out   COVID-19 Vaccine  Discontinued    Health Maintenance  There are no preventive care reminders to display for this patient.   Colorectal cancer screening: Type of screening: Colonoscopy. Completed 04/29/21. Repeat every 10 years  Lung Cancer Screening: (Low Dose CT Chest recommended if Age 65-80years, 30 pack-year currently smoking OR have quit w/in 15years.) does qualify.   Lung Cancer Screening Referral: Patient deferred  Additional Screening:  Hepatitis C Screening: does qualify; Completed 08/18/10  Vision  Screening: Recommended annual ophthalmology exams for early detection of glaucoma and other disorders of the eye. Is the patient up to date with their annual eye exam?  Yes  Who is the provider or what is the name of the office in which the patient attends annual eye exams? Dr BValetta CloseIf pt is not established with a provider, would they like to be referred to a provider to establish care? No .   Dental Screening: Recommended annual dental exams for proper oral hygiene  Community Resource Referral / Chronic Care Management:  CRR required this visit?  No   CCM required this visit?  No      Plan:     I have personally reviewed and noted the following in the patient's chart:   Medical and social history Use of alcohol, tobacco or illicit drugs  Current medications and supplements including opioid prescriptions. Patient is not currently taking opioid prescriptions. Functional ability and status Nutritional status Physical activity Advanced directives List of other physicians Hospitalizations, surgeries, and ER visits in previous 12 months Vitals Screenings to include cognitive, depression, and falls Referrals and appointments  In addition, I have reviewed and discussed with patient certain preventive protocols, quality metrics, and best practice recommendations. A written personalized care plan for preventive services as well as general preventive health recommendations were provided to patient.     Nicholas Peaches, LPN   07/05/348   Nurse Notes: None

## 2022-02-02 ENCOUNTER — Ambulatory Visit (INDEPENDENT_AMBULATORY_CARE_PROVIDER_SITE_OTHER): Payer: 59 | Admitting: Endocrinology

## 2022-02-02 ENCOUNTER — Encounter: Payer: Self-pay | Admitting: Endocrinology

## 2022-02-02 VITALS — BP 126/82 | HR 71 | Ht 73.0 in | Wt 189.0 lb

## 2022-02-02 DIAGNOSIS — E1165 Type 2 diabetes mellitus with hyperglycemia: Secondary | ICD-10-CM

## 2022-02-02 DIAGNOSIS — Z794 Long term (current) use of insulin: Secondary | ICD-10-CM

## 2022-02-02 LAB — POCT GLYCOSYLATED HEMOGLOBIN (HGB A1C): Hemoglobin A1C: 9.2 % — AB (ref 4.0–5.6)

## 2022-02-02 NOTE — Progress Notes (Signed)
Patient ID: Nicholas Whitehead, male   DOB: 1955-02-24, 67 y.o.   MRN: [id number]           Patient ID: Nicholas Whitehead, male   DOB: September 06, 1955, 67 y.o.   MRN: [id number]   Reason for Appointment: Type II Diabetes follow-up   History of Present Illness   Diagnosis date: 1998    Previous history: He was started on oral agents at diagnosis and around 2006 was put on insulin His blood sugars are usually well controlled when he takes Actos along with his insulin. He had been on Invokana since 04/2013  He requires large doses of insulin especially basal insulin   Recent history:   Insulin regimen: Toujeo: 40 units am; 6-8 units in the evening, Novolog 30 units before meals         Oral hypoglycemic drugs: Metformin ER 1000 mg twice a day, Actos 30 mg, Ozempic 1 weekly, Farxiga '10mg'$   A1c is 9.2  Fructosamine previously 316  Current management, blood sugar patterns and problems as of 10/23: He is having much higher blood sugars in the last few days He says that about a month ago because his blood sugars were starting to get low he stopped Ozempic Also even though he is supposed to be continuing to increase his evening insulin to get his morning sugars down lately has not done so Previously from his old sensor his blood sugars appear to be averaging about 154 although relatively higher in the afternoon and early evening with a GMI of 7 Currently has blood sugars overnight maybe even over 350 but overall highest overnight and in the afternoon He thinks he is taking his NovoLog consistently along with [place] and metformin He is not doing any walking because of issues with his feet and balance Weight is about the same recently  Dinner 8-9 pm     Side effects from medications:  urgency, frequent urination from Invokana   Interpretation of his freestyle libre version 3 download for the last 2 weeks as follows, using freestyle libre 2  His blood sugars overall were fairly consistently increased  in the target range between 1/16 and 1/18 but subsequently higher More recently his blood sugars are appearing to be periodically higher but in the last 4 days they are well above 200 at all times with highest readings overnight and early afternoon  No hypoglycemia Overnight blood sugars as above are highly variable with fairly good readings on a couple of days 10 days ago but much higher now and occasionally over 350  Postprandial hyperglycemia seen mostly after lunch and occasionally after dinner  CGM use % of time   2-week average/GV 210/37  Time in range 42       %  % Time Above 180 30+28  % Time above 250   % Time Below 70 0     PRE-MEAL Fasting Lunch Dinner Bedtime Overall  Glucose range:       Averages: 179    210   POST-MEAL PC Breakfast PC Lunch PC Dinner  Glucose range:     Averages:  250 192   Previously  CGM use % of time   2-week average/GV   Time in range      12  %  % Time Above 180 67  % Time above 250 21  % Time Below 70      PRE-MEAL Fasting Lunch Dinner Bedtime Overall  Glucose range:       Averages: 222  POST-MEAL PC Breakfast PC Lunch PC Dinner  Glucose range:     Averages: 218 238 228           Meals: Breakfast variable, 9-10 AM Lunch 2-3pm  usually avoiding high-fat meals, dinner at about 6 pm          Dietician visit: Most recent: 2013    Weight control:  Wt Readings from Last 3 Encounters:  02/02/22 189 lb (85.7 kg)  01/31/22 192 lb (87.1 kg)  12/08/21 192 lb (87.1 kg)          Diabetes labs:  Lab Results  Component Value Date   HGBA1C 9.2 (A) 02/02/2022   HGBA1C 8.7 (H) 10/24/2021   HGBA1C 7.4 (H) 06/13/2021   Lab Results  Component Value Date   MICROALBUR <0.7 04/29/2021   [place] 95 01/24/2022   CREATININE 1.06 01/24/2022   Lab Results  Component Value Date   FRUCTOSAMINE 316 (H) 03/10/2021   FRUCTOSAMINE 367 (H) 02/01/2021   FRUCTOSAMINE 280 09/11/2019   FRUCTOSAMINE 298 (H) 04/28/2019   FRUCTOSAMINE 276  09/13/2018    Other active problems discussed today: See review of systems   Office Visit on 02/02/2022  Component Date Value Ref Range Status   Hemoglobin A1C 02/02/2022 9.2 (A)  4.0 - 5.6 % Final     Allergies as of 02/02/2022       Reactions   Benazepril Cough   Molds & Smuts    Hard to breathe   Pravastatin Other (See Comments)   myositis   Glipizide Nausea Only        Medication List        Accurate as of February 02, 2022  4:47 PM. If you have any questions, ask your nurse or doctor.          Accu-Chek Guide test strip Generic drug: glucose blood TEST BLOOD SUGAR FOUR TIMES DAILY   albuterol 108 (90 Base) MCG/ACT inhaler Commonly known as: VENTOLIN HFA Inhale 2 puffs into the lungs every 6 (six) hours as needed for wheezing or shortness of breath.   aspirin EC 81 MG tablet Take 81 mg by mouth every morning.   B-D SINGLE USE SWABS REGULAR Pads USE AS DIRECTED   B-D ULTRAFINE III SHORT PEN 31G X 8 MM Misc Generic drug: Insulin Pen Needle USE TO INJECT INSULIN AS DIRECTED   Bevespi Aerosphere 9-4.8 MCG/ACT Aero Generic drug: Glycopyrrolate-Formoterol Inhale 2 puffs into the lungs 2 (two) times daily.   cetirizine 10 MG tablet Commonly known as: ZYRTEC Take 10 mg by mouth daily.   Cholecalciferol 50 MCG (2000 UT) Tabs Take 1 tablet (2,000 Units total) by mouth daily.   DULoxetine 60 MG capsule Commonly known as: CYMBALTA TAKE 1 CAPSULE(60 MG) BY MOUTH DAILY   Farxiga 10 MG Tabs tablet Generic drug: dapagliflozin propanediol TAKE 1 TABLET BY MOUTH DAILY BEFORE BREAKFAST   FreeStyle Libre 3 Reader Devi 1 Act by Does not apply route daily.   FreeStyle Libre 3 Sensor Misc 1 Act by Does not apply route daily. Place 1 sensor on the skin every 14 days. Use to check glucose continuously   gabapentin 300 MG capsule Commonly known as: NEURONTIN TAKE 1 CAPSULE BY MOUTH THREE TIMES DAILY   Glucagon Emergency 1 MG Kit Inject 1 mg into the skin  once for 1 dose.   Gvoke HypoPen 2-Pack 1 MG/0.2ML Soaj Generic drug: Glucagon Inject 1 Act into the skin daily as needed.   HumaLOG KwikPen 100 UNIT/ML KwikPen  Generic drug: insulin lispro ADMINISTER 25 TO 30 UNITS UNDER THE SKIN THREE TIMES DAILY   Lancets Misc Use to test blood sugar tid. DX: E11.8   Livalo 1 MG Tabs Generic drug: Pitavastatin Calcium TAKE 1 TABLET(1 MG) BY MOUTH DAILY   mirtazapine 7.5 MG tablet Commonly known as: REMERON Take 1 tablet (7.5 mg total) by mouth at bedtime.   mupirocin ointment 2 % Commonly known as: BACTROBAN Apply 1 Application topically 2 (two) times daily.   Nebivolol HCl 20 MG Tabs TAKE 1 TABLET(20 MG) BY MOUTH DAILY   Odefsey 200-25-25 MG Tabs tablet Generic drug: emtricitabine-rilpivir-tenofovir AF Take 1 tablet by mouth daily.   olmesartan 40 MG tablet Commonly known as: BENICAR TAKE 1 TABLET(40 MG) BY MOUTH DAILY   Ozempic (1 MG/DOSE) 4 MG/3ML Sopn Generic drug: Semaglutide (1 MG/DOSE) Inject 1 mg weekly   pioglitazone 30 MG tablet Commonly known as: ACTOS TAKE 1 TABLET(30 MG) BY MOUTH DAILY   torsemide 20 MG tablet Commonly known as: DEMADEX Take 1 tablet (20 mg total) by mouth every Monday, Wednesday, and Friday.   Toujeo SoloStar 300 UNIT/ML Solostar Pen Generic drug: insulin glargine (1 Unit Dial) Inject 44 units daily   traZODone 150 MG tablet Commonly known as: DESYREL TAKE 3 TABLETS(450 MG) BY MOUTH AT BEDTIME        Allergies:  Allergies  Allergen Reactions   Benazepril Cough   Molds & Smuts     Hard to breathe   Pravastatin Other (See Comments)    myositis   Glipizide Nausea Only    Past Medical History:  Diagnosis Date   Anxiety    Cataract    right    Depression    Diabetes mellitus without complication ([place])    DM (diabetes mellitus screen)    HIV disease ([place]) 09/12/1995   HIV infection ([place])    Hypercholesterolemia    Hyperlipidemia    Hypertension     Past Surgical History:   Procedure Laterality Date   COLONOSCOPY WITH PROPOFOL  01/16/2012   Procedure: COLONOSCOPY WITH PROPOFOL;  Surgeon: Garlan Fair, MD;  Location: [facility];  Service: Endoscopy;  Laterality: N/A;   EYE SURGERY  2005   catarct surgery   optic lens surgery  2005    Family History  Problem Relation Age of Onset   Arthritis Mother    Diabetes Mother    Heart disease Mother    Stroke Mother    Arthritis Father    Diabetes Father    Heart disease Father     Social History:  reports that he has been smoking cigarettes. He has been smoking an average of .25 packs per day. He has never used smokeless tobacco. He reports that he does not drink alcohol and does not use drugs.  Review of Systems:  Hypertension:  on Bystolic 20 mg and on Benicar 40 mg, prescribed by his PCP   BP Readings from Last 3 Encounters:  02/02/22 126/82  12/08/21 100/60  10/28/21 114/70   RENAL function is slightly better Normal results of microalbumin as of 4/23 No significant edema lately  Lab Results  Component Value Date   CREATININE 1.06 01/24/2022   CREATININE 1.34 10/24/2021   CREATININE 1.31 06/13/2021    Lipids: LDL is at target as follows Treatment prescribed by PCP, he is on Livalo 1 mg   Lab Results  Component Value Date   CHOL 165 01/24/2022   HDL 43 01/24/2022   [place] 95 01/24/2022  TRIG 170 (H) 01/24/2022   CHOLHDL 3.8 01/24/2022    Neuropathy: Has had paresthesiae in feet and legs, these are treated with gabapentin 300 mg 3 times a day  Forms for the diabetic shoes were sent in July 2021 to the podiatrist. Has sensory loss on exam He appears to have had some contracture of his tendon on his foot after surgery   Examination:   BP 126/82 (BP Location: Left Arm, Patient Position: Sitting, Cuff Size: Normal)   Pulse 71   Ht '6\' 1"'$  (1.854 m)   Wt 189 lb (85.7 kg)   SpO2 96%   BMI 24.94 kg/m   Body mass index is 24.94 kg/m.      ASSESSMENT/ PLAN:    Diabetes  type 2 on insulin:   See history of present illness for detailed discussion of his current management, blood sugar patterns and problems identified  His A1c is 8.7 compared to 7.4   He is on basal bolus insulin, Metformin, Farxiga, Ozempic 1 mg weekly and Actos 30 mg  With his going off on his Ozempic recently his blood sugars are well over 200 at all times especially in the last 4 days despite continuing basal bolus insulin Does not appear to be significantly changing his diet GMI is also high at 8.3  Recommendations:   He will continue the Toujeo to twice a day, however he will need to go up to at least 20 units in the evening and continue to increase by 4 units until morning sugars are at least below 150 Restart Ozempic, may take 0.5 mg with the first dose Consistent diet Discussed that likely he will need to reduce his Toujeo gradually once Ozempic is effective but keep the same proportion of insulin twice a day No change in NovoLog at this time but if he has higher readings after lunch may need to take 35 Call if blood sugars are not improving Follow-up with diabetes educator to further assist and adjustment in 2 or 3 weeks  For his balance issues recommended that he temporarily use a walker but he is reluctant to do so  Hypertension: Blood pressure well controlled      Patient Instructions  Ozempic: 1st dose to be 1/2   Toujeo 40--20 till sugars <150     Time for evaluation and management and total visit time including counseling = 30 minutes    Elayne Snare 02/02/2022, 4:47 PM           ..

## 2022-02-02 NOTE — Patient Instructions (Signed)
Ozempic: 1st dose to be 1/2   Toujeo 40--20 till sugars <150

## 2022-02-03 ENCOUNTER — Encounter: Payer: Self-pay | Admitting: Endocrinology

## 2022-02-04 ENCOUNTER — Other Ambulatory Visit: Payer: Self-pay | Admitting: Endocrinology

## 2022-02-04 DIAGNOSIS — E1140 Type 2 diabetes mellitus with diabetic neuropathy, unspecified: Secondary | ICD-10-CM

## 2022-02-07 ENCOUNTER — Encounter: Payer: Self-pay | Admitting: Internal Medicine

## 2022-02-07 ENCOUNTER — Ambulatory Visit (INDEPENDENT_AMBULATORY_CARE_PROVIDER_SITE_OTHER): Payer: 59 | Admitting: Internal Medicine

## 2022-02-07 ENCOUNTER — Other Ambulatory Visit: Payer: Self-pay

## 2022-02-07 VITALS — BP 119/76 | HR 69 | Temp 97.6°F | Ht 73.0 in | Wt 190.0 lb

## 2022-02-07 DIAGNOSIS — E785 Hyperlipidemia, unspecified: Secondary | ICD-10-CM | POA: Diagnosis not present

## 2022-02-07 DIAGNOSIS — B20 Human immunodeficiency virus [HIV] disease: Secondary | ICD-10-CM | POA: Diagnosis not present

## 2022-02-07 DIAGNOSIS — Z113 Encounter for screening for infections with a predominantly sexual mode of transmission: Secondary | ICD-10-CM | POA: Diagnosis not present

## 2022-02-07 NOTE — Assessment & Plan Note (Signed)
On pitavastatin and reviewed with him

## 2022-02-07 NOTE — Assessment & Plan Note (Signed)
He continues to do well on Odefsey.  I did discuss other options such as Dovato or biktarvy which do not require food intake and he would like to consider these at another time.  Labs reviewed with him and reassuring.  Folllow up in 6 months.

## 2022-02-07 NOTE — Progress Notes (Signed)
   Subjective:    Patient ID: Nicholas Whitehead, male    DOB: 10-22-1955, 67 y.o.   MRN: [id number]  HPI Here for follow up of HIV He has been on [facility] and denies any missed doses.  No issues with getting or taking the medication.  No complaints today.  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  Working on his blood sugar.    Review of Systems  Constitutional:  Negative for fatigue.  Gastrointestinal:  Negative for diarrhea and nausea.  Skin:  Negative for rash.       Objective:   Physical Exam Eyes:     General: No scleral icterus. Pulmonary:     Effort: Pulmonary effort is normal.  Neurological:     Mental Status: He is alert.   SH: no tobacco        Assessment & Plan:

## 2022-02-07 NOTE — Assessment & Plan Note (Signed)
Screened negative 

## 2022-02-14 ENCOUNTER — Ambulatory Visit (INDEPENDENT_AMBULATORY_CARE_PROVIDER_SITE_OTHER): Payer: 59 | Admitting: Podiatry

## 2022-02-14 DIAGNOSIS — B351 Tinea unguium: Secondary | ICD-10-CM

## 2022-02-14 DIAGNOSIS — E1140 Type 2 diabetes mellitus with diabetic neuropathy, unspecified: Secondary | ICD-10-CM | POA: Diagnosis not present

## 2022-02-14 DIAGNOSIS — M79674 Pain in right toe(s): Secondary | ICD-10-CM | POA: Diagnosis not present

## 2022-02-14 DIAGNOSIS — M2041 Other hammer toe(s) (acquired), right foot: Secondary | ICD-10-CM

## 2022-02-14 DIAGNOSIS — M2042 Other hammer toe(s) (acquired), left foot: Secondary | ICD-10-CM

## 2022-02-14 DIAGNOSIS — M79675 Pain in left toe(s): Secondary | ICD-10-CM | POA: Diagnosis not present

## 2022-02-14 DIAGNOSIS — Z794 Long term (current) use of insulin: Secondary | ICD-10-CM | POA: Diagnosis not present

## 2022-02-14 DIAGNOSIS — L84 Corns and callosities: Secondary | ICD-10-CM

## 2022-02-16 ENCOUNTER — Ambulatory Visit (INDEPENDENT_AMBULATORY_CARE_PROVIDER_SITE_OTHER): Payer: 59

## 2022-02-16 DIAGNOSIS — Z89419 Acquired absence of unspecified great toe: Secondary | ICD-10-CM

## 2022-02-16 DIAGNOSIS — M2141 Flat foot [pes planus] (acquired), right foot: Secondary | ICD-10-CM

## 2022-02-16 DIAGNOSIS — M2041 Other hammer toe(s) (acquired), right foot: Secondary | ICD-10-CM

## 2022-02-16 DIAGNOSIS — M2142 Flat foot [pes planus] (acquired), left foot: Secondary | ICD-10-CM

## 2022-02-16 DIAGNOSIS — E1140 Type 2 diabetes mellitus with diabetic neuropathy, unspecified: Secondary | ICD-10-CM

## 2022-02-16 NOTE — Progress Notes (Signed)
Patient presents to the office today for diabetic shoe and insole measuring.  Patient was measured with brannock device to determine size and width for 1 pair of extra depth shoes and foam casted for 3 pair of insoles.   ABN signed.   Documentation of medical necessity will be sent to patient's treating diabetic doctor to verify and sign.   Patient's diabetic provider: DR Dwyane Dee  Shoes and insoles will be ordered at that time and patient will be notified for an appointment for fitting when they arrive.    Patient shoe selection-   1st   Shoe choice:   [id number] APEX  Shoe size ordered: 10.5 M

## 2022-02-19 ENCOUNTER — Other Ambulatory Visit: Payer: Self-pay | Admitting: Internal Medicine

## 2022-02-19 ENCOUNTER — Other Ambulatory Visit: Payer: Self-pay | Admitting: Endocrinology

## 2022-02-19 ENCOUNTER — Encounter: Payer: Self-pay | Admitting: Podiatry

## 2022-02-19 DIAGNOSIS — E1165 Type 2 diabetes mellitus with hyperglycemia: Secondary | ICD-10-CM

## 2022-02-19 DIAGNOSIS — E1140 Type 2 diabetes mellitus with diabetic neuropathy, unspecified: Secondary | ICD-10-CM

## 2022-02-19 NOTE — Progress Notes (Signed)
  Subjective:  Patient ID: Nicholas Whitehead, male    DOB: 1955-06-28,  MRN: [id number]  Chief Complaint  Patient presents with   Diabetes    left foot circulation eval; diabetic foot care-nail trim   Hammer Toe    Left worse than right   Difficulty Walking    He is having more issues with his feet "turning out" when he walks. This is causing him to trip more easily    67 y.o. male presents with the above complaint. History confirmed with patient.  He returns for follow-up his nails are thick elongated causing discomfort.  He has calluses on both heels.  Objective:  Physical Exam: warm, good capillary refill, no trophic changes or ulcerative lesions, normal DP and PT pulses, and amputation site well-healed left hallux partial, he has calluses preulcerative on both heels, thickened elongated yellow discolored toenails x 9 Assessment:   1. Pain due to onychomycosis of toenails of both feet   2. Type 2 diabetes mellitus with diabetic neuropathy, with long-term current use of insulin ([place])   3. Pre-ulcerative calluses   4. Hammer toe of left foot   5. Hammertoe of right foot      Plan:  Patient was evaluated and treated and all questions answered.  Patient educated on diabetes. Discussed proper diabetic foot care and discussed risks and complications of disease. Educated patient in depth on reasons to return to the office immediately should he/she discover anything concerning or new on the feet. All questions answered. Discussed proper shoes as well.  Recommended diabetic extra-depth shoes with multidensity insoles due to his amputation history and neuropathy.  He will be fitted for these.  Discussed the etiology and treatment options for the condition in detail with the patient. Educated patient on the topical and oral treatment options for mycotic nails. Recommended debridement of the nails today. Sharp and mechanical debridement performed of all painful and mycotic nails today. Nails  debrided in length and thickness using a nail nipper to level of comfort. Discussed treatment options including appropriate shoe gear. Follow up as needed for painful nails.   All symptomatic hyperkeratoses were safely debrided with a sterile #15 blade to patient's level of comfort without incident. We discussed preventative and palliative care of these lesions including supportive and accommodative shoegear, padding, prefabricated and custom molded accommodative orthoses, use of a pumice stone and lotions/creams daily.    Return in about 3 months (around 05/15/2022) for at risk diabetic foot care.

## 2022-02-21 ENCOUNTER — Encounter: Payer: 59 | Attending: Internal Medicine | Admitting: Nutrition

## 2022-02-21 DIAGNOSIS — E0840 Diabetes mellitus due to underlying condition with diabetic neuropathy, unspecified: Secondary | ICD-10-CM | POA: Diagnosis not present

## 2022-02-22 NOTE — Patient Instructions (Signed)
Take Novolog 25u before breakfast Can increase Novolog dose by 2u when eating more, or reduce dose when eating less.

## 2022-02-22 NOTE — Progress Notes (Signed)
Patient is here today to review his blood sugar readings and insulin doses:   He is wearing a Libre 2.  Reader shows blood sugar pattern over last 14 day:   12AM-6AM: 177                                                                                                                                                                                                                        6A-12P:       179                                                                                                                                                                                                                         12P-6P:        194  6P-12AM:  182 Insulin dose:  Toujeo: 40uAM, 20u PM                       Novolog:  20u acB and 20u acS Ozempic 1X/wk;  Pt. Reports eating less and not snacking between meals  Pt. Was shown that his blood sugars are highest acS and that he needs Novolog acL.  He did not want to take 48, even though he says this is his biggest meal of the day. He will start out at 25u, and if blood sugars are still high acS, he can increase this by 5u q 3 days.  We discussed need to adjust Novolog dose by 2u when eating more/less, something he does not do, despite eating various size meals depending on his money situation for the week.  Exercise is not an variable for him, due to pain in back and legs.  Plan:  take 25u ac L.  Increase dose by 2-5u when eating more, or reduce novolog dose when meal sizes are smaller.

## 2022-03-09 ENCOUNTER — Ambulatory Visit (INDEPENDENT_AMBULATORY_CARE_PROVIDER_SITE_OTHER): Payer: 59

## 2022-03-09 ENCOUNTER — Ambulatory Visit (INDEPENDENT_AMBULATORY_CARE_PROVIDER_SITE_OTHER): Payer: 59 | Admitting: Internal Medicine

## 2022-03-09 ENCOUNTER — Encounter: Payer: Self-pay | Admitting: Internal Medicine

## 2022-03-09 VITALS — BP 120/68 | HR 75 | Temp 98.1°F | Ht 73.0 in | Wt 190.0 lb

## 2022-03-09 DIAGNOSIS — L089 Local infection of the skin and subcutaneous tissue, unspecified: Secondary | ICD-10-CM | POA: Insufficient documentation

## 2022-03-09 DIAGNOSIS — M79674 Pain in right toe(s): Secondary | ICD-10-CM

## 2022-03-09 DIAGNOSIS — M79604 Pain in right leg: Secondary | ICD-10-CM | POA: Diagnosis not present

## 2022-03-09 DIAGNOSIS — M545 Low back pain, unspecified: Secondary | ICD-10-CM | POA: Diagnosis not present

## 2022-03-09 DIAGNOSIS — M86171 Other acute osteomyelitis, right ankle and foot: Secondary | ICD-10-CM | POA: Diagnosis not present

## 2022-03-09 DIAGNOSIS — E119 Type 2 diabetes mellitus without complications: Secondary | ICD-10-CM

## 2022-03-09 DIAGNOSIS — M5441 Lumbago with sciatica, right side: Secondary | ICD-10-CM | POA: Diagnosis not present

## 2022-03-09 DIAGNOSIS — Z794 Long term (current) use of insulin: Secondary | ICD-10-CM

## 2022-03-09 DIAGNOSIS — M19071 Primary osteoarthritis, right ankle and foot: Secondary | ICD-10-CM | POA: Diagnosis not present

## 2022-03-09 DIAGNOSIS — E11628 Type 2 diabetes mellitus with other skin complications: Secondary | ICD-10-CM

## 2022-03-09 DIAGNOSIS — I739 Peripheral vascular disease, unspecified: Secondary | ICD-10-CM | POA: Insufficient documentation

## 2022-03-09 MED ORDER — ONDANSETRON 8 MG PO TBDP: 8.0000 mg | ORAL_TABLET | Freq: Three times a day (TID) | ORAL | 0 refills | Status: DC | PRN

## 2022-03-09 MED ORDER — NUZYRA 150 MG PO TABS: 2.0000 | ORAL_TABLET | Freq: Every day | ORAL | 0 refills | Status: DC

## 2022-03-09 MED ORDER — NUZYRA 150 MG PO TABS: 450.0000 mg | ORAL_TABLET | Freq: Every day | ORAL | 0 refills | Status: AC

## 2022-03-09 NOTE — Progress Notes (Signed)
Subjective:  Patient ID: Nicholas Whitehead, male    DOB: 03-03-1955  Age: 67 y.o. MRN: [id number]  CC: Hypertension and Diabetes   HPI Nicholas Whitehead presents for f/up ----  He complains of a 3 week history of discoloration and abnormal appearance of the right great toe.  Outpatient Medications Prior to Visit  Medication Sig Dispense Refill   albuterol (VENTOLIN HFA) 108 (90 Base) MCG/ACT inhaler Inhale 2 puffs into the lungs every 6 (six) hours as needed for wheezing or shortness of breath. 1 each 5   Alcohol Swabs (B-D SINGLE USE SWABS REGULAR) PADS USE AS DIRECTED 300 each 1   aspirin EC 81 MG tablet Take 81 mg by mouth every morning.     BELSOMRA 20 MG TABS Take 1 tablet by mouth at bedtime as needed.     cetirizine (ZYRTEC) 10 MG tablet Take 10 mg by mouth daily.     Cholecalciferol 2000 units TABS Take 1 tablet (2,000 Units total) by mouth daily. 90 tablet 1   Continuous Blood Gluc Receiver (FREESTYLE LIBRE 3 READER) DEVI 1 Act by Does not apply route daily. 2 each 5   Continuous Blood Gluc Sensor (FREESTYLE LIBRE 3 SENSOR) MISC 1 Act by Does not apply route daily. Place 1 sensor on the skin every 14 days. Use to check glucose continuously 2 each 5   dapagliflozin propanediol (FARXIGA) 10 MG TABS tablet TAKE 1 TABLET BY MOUTH DAILY BEFORE BREAKFAST 90 tablet 0   DULoxetine (CYMBALTA) 60 MG capsule TAKE 1 CAPSULE(60 MG) BY MOUTH DAILY 90 capsule 1   emtricitabine-rilpivir-tenofovir AF (ODEFSEY) 200-25-25 MG TABS tablet Take 1 tablet by mouth daily. 90 tablet 3   gabapentin (NEURONTIN) 300 MG capsule TAKE 1 CAPSULE BY MOUTH THREE TIMES DAILY 270 capsule 1   Glucagon (GVOKE HYPOPEN 2-PACK) 1 MG/0.2ML SOAJ Inject 1 Act into the skin daily as needed. 2 mL 6   glucose blood (ACCU-CHEK GUIDE) test strip TEST BLOOD SUGAR FOUR TIMES DAILY 400 strip 3   Glycopyrrolate-Formoterol (BEVESPI AEROSPHERE) 9-4.8 MCG/ACT AERO Inhale 2 puffs into the lungs 2 (two) times daily. 32.1 g 1   HUMALOG KWIKPEN  100 UNIT/ML KwikPen ADMINISTER 25 TO 30 UNITS UNDER THE SKIN THREE TIMES DAILY 90 mL 1   insulin glargine, 1 Unit Dial, (TOUJEO SOLOSTAR) 300 UNIT/ML Solostar Pen Inject 44 units daily 6 mL 3   Insulin Pen Needle (B-D ULTRAFINE III SHORT PEN) 31G X 8 MM MISC USE TO INJECT INSULN AS DIRECTED THREE TIMES DAILY PER HUMALOG PRESCRIPTION 500 each 1   Lancets MISC Use to test blood sugar tid. DX: E11.8 300 each 3   mirtazapine (REMERON) 7.5 MG tablet Take 1 tablet (7.5 mg total) by mouth at bedtime. 90 tablet 0   mupirocin ointment (BACTROBAN) 2 % Apply 1 Application topically 2 (two) times daily. 30 g 2   Nebivolol HCl 20 MG TABS TAKE 1 TABLET(20 MG) BY MOUTH DAILY 90 tablet 1   olmesartan (BENICAR) 40 MG tablet TAKE 1 TABLET(40 MG) BY MOUTH DAILY 90 tablet 1   pioglitazone (ACTOS) 30 MG tablet TAKE 1 TABLET(30 MG) BY MOUTH DAILY 90 tablet 1   Pitavastatin Calcium (LIVALO) 1 MG TABS TAKE 1 TABLET(1 MG) BY MOUTH DAILY 90 tablet 1   Semaglutide, 1 MG/DOSE, (OZEMPIC, 1 MG/DOSE,) 4 MG/3ML SOPN Inject 1 mg weekly 3 mL 5   torsemide (DEMADEX) 20 MG tablet Take 1 tablet (20 mg total) by mouth every Monday, Wednesday, and Friday. 36 tablet 0  traZODone (DESYREL) 150 MG tablet TAKE 3 TABLETS(450 MG) BY MOUTH AT BEDTIME 270 tablet 1   Glucagon, rDNA, (GLUCAGON EMERGENCY) 1 MG KIT Inject 1 mg into the skin once for 1 dose. 1 kit 5   No facility-administered medications prior to visit.    ROS Review of Systems  Constitutional: Negative.  Negative for chills, diaphoresis, fatigue and fever.  HENT: Negative.    Eyes: Negative.   Respiratory: Negative.  Negative for cough, chest tightness and wheezing.   Cardiovascular:  Negative for chest pain, palpitations and leg swelling.  Gastrointestinal:  Negative for abdominal pain, diarrhea, nausea and vomiting.  Genitourinary: Negative.  Negative for difficulty urinating.  Musculoskeletal:  Positive for back pain.  Skin:  Positive for color change.  Neurological:   Positive for numbness. Negative for dizziness and weakness.  Hematological:  Negative for adenopathy. Does not bruise/bleed easily.  Psychiatric/Behavioral: Negative.      Objective:  BP 120/68 (BP Location: Left Arm, Patient Position: Sitting, Cuff Size: Normal)   Pulse 75   Temp 98.1 F (36.7 C) (Oral)   Ht '6\' 1"'$  (1.854 m)   Wt 190 lb (86.2 kg)   SpO2 95%   BMI 25.07 kg/m   BP Readings from Last 3 Encounters:  03/09/22 120/68  02/07/22 119/76  02/02/22 126/82    Wt Readings from Last 3 Encounters:  03/09/22 190 lb (86.2 kg)  02/07/22 190 lb (86.2 kg)  02/02/22 189 lb (85.7 kg)    Physical Exam Vitals reviewed.  Constitutional:      General: He is not in acute distress.    Appearance: He is not toxic-appearing or diaphoretic.  HENT:     Nose: Nose normal.     Mouth/Throat:     Mouth: Mucous membranes are moist.  Eyes:     General: No scleral icterus.    Conjunctiva/sclera: Conjunctivae normal.  Cardiovascular:     Rate and Rhythm: Normal rate and regular rhythm.     Pulses: Normal pulses.     Heart sounds: No murmur heard.    No friction rub. No gallop.  Pulmonary:     Effort: Pulmonary effort is normal.     Breath sounds: No stridor. No wheezing, rhonchi or rales.  Abdominal:     General: Abdomen is flat.     Palpations: There is no mass.     Tenderness: There is no abdominal tenderness. There is no guarding.     Hernia: No hernia is present.  Musculoskeletal:     Cervical back: Normal and neck supple.     Thoracic back: Normal.     Lumbar back: No bony tenderness. Decreased range of motion. Negative right straight leg raise test and negative left straight leg raise test.     Right lower leg: No edema.     Left lower leg: No edema.  Lymphadenopathy:     Cervical: No cervical adenopathy.  Skin:    General: Skin is warm and dry.  Neurological:     General: No focal deficit present.     Motor: Weakness present.     Coordination: Coordination is  intact.     Gait: Gait abnormal.     Comments: Weakness in BLE     Lab Results  Component Value Date   WBC 9.0 12/08/2021   HGB 15.8 12/08/2021   HCT 47.0 12/08/2021   PLT 192.0 12/08/2021   GLUCOSE 237 (H) 01/24/2022   CHOL 165 01/24/2022   TRIG 170 (H) 01/24/2022  HDL 43 01/24/2022   LDLCALC 95 01/24/2022   ALT 22 01/24/2022   AST 22 01/24/2022   NA 137 01/24/2022   K 4.2 01/24/2022   CL 102 01/24/2022   CREATININE 1.06 01/24/2022   BUN 13 01/24/2022   CO2 28 01/24/2022   TSH 1.05 12/08/2021   PSA 1.72 12/08/2021   HGBA1C 9.2 (A) 02/02/2022   MICROALBUR <0.7 04/29/2021    MR TOES LEFT WO CONTRAST  Result Date: 08/12/2021 CLINICAL DATA:  Foot swelling, diabetic, osteomyelitis suspected, xray done EXAM: MRI OF THE LEFT TOES WITHOUT CONTRAST TECHNIQUE: Multiplanar, multisequence MR imaging of the left toes was performed. No intravenous contrast was administered. COMPARISON:  Foot radiograph 08/04/2021 FINDINGS: Bones/Joint/Cartilage There is marrow edema within the distal phalanx of the great toe, with low T1 signal in the distal tuft. No other significant marrow signal alteration. Ligaments Intact Lisfranc ligament. Intact MTP collateral ligaments. No evidence of plantar plate tear. Muscles and Tendons No acute tendon tear. There is intramuscular edema and atrophy in the forefoot, likely denervation change. Soft tissues There is generalized soft tissue swelling of the foot. No focal fluid collection. IMPRESSION: Marrow edema within the distal phalanx of the great toe with confluent low T1 signal in the distal tuft, consistent with osteomyelitis. No evidence of soft tissue abscess. These results will be called to the ordering clinician or representative by the Radiologist Assistant, and communication documented in the PACS or [employer]. Electronically Signed   By: Maurine Simmering M.D.   On: 08/12/2021 16:16    DG Toe Great Right  Result Date: 03/09/2022 CLINICAL DATA:  Pain  EXAM: RIGHT GREAT TOE COMPARISON:  Radiographs of right foot done on 05/25/2020 in outside institution FINDINGS: No recent fracture or dislocation is seen. There is interval fusion of interphalangeal joint. Degenerative changes were noted in the interphalangeal joint in the previous study. There is small defect in the medial cortical margin of tip of distal phalanx. Bony spurs are noted in first metatarsophalangeal joint. There are no opaque foreign bodies. IMPRESSION: There is small cortical defect in the medial aspect of tip of distal phalanx which may be residual from previous injury or suggest osteomyelitis. If clinically warranted, follow-up MRI may be considered. No recent fracture or dislocation is seen. There is fusion of interphalangeal joint. Electronically Signed   By: Elmer Picker M.D.   On: 03/09/2022 12:07   DG Lumbar Spine Complete  Result Date: 03/09/2022 CLINICAL DATA:  Lower back and right lower extremity pain for 6 weeks without known injury. EXAM: LUMBAR SPINE - COMPLETE 4+ VIEW COMPARISON:  None Available. FINDINGS: There is no evidence of lumbar spine fracture. Alignment is normal. Intervertebral disc spaces are maintained. Mild hypertrophy of the right-sided posterior facet joints of L4-5 and L5-S1 is noted. IMPRESSION: Mild degenerative changes as described above. No acute abnormality seen. Electronically Signed   By: Marijo Conception M.D.   On: 03/09/2022 12:03     Assessment & Plan:   Neko was seen today for hypertension and diabetes.  Diagnoses and all orders for this visit:  Acute right-sided low back pain with right-sided sciatica- Plain films are c/w DDD. -     DG Lumbar Spine Complete; Future  Pain of right great toe- Exam is abnormal.  Plain films are concerning for osteomyelitis.  Will start treatment with NUZYRA (samples given). Will assess the arterial flow and will confirm the presence of osteomyelitis with an MRI. -     DG Toe Great  Right; Future -     VAS  [name] ABI WITH/WO TBI; Future  Acute osteomyelitis of toe of right foot ([place])-  -     Omadacycline Tosylate (NUZYRA) 150 MG TABS; Take 2 tablets (300 mg total) by mouth daily for 14 days. -     MR Toes Right W/Cm; Future -     ondansetron (ZOFRAN-ODT) 8 MG disintegrating tablet; Take 1 tablet (8 mg total) by mouth every 8 (eight) hours as needed for nausea or vomiting.  Diabetic foot infection (HCC) -     Omadacycline Tosylate (NUZYRA) 150 MG TABS; Take 2 tablets (300 mg total) by mouth daily for 14 days. -     MR Toes Right W/Cm; Future -     VAS [name] ABI WITH/WO TBI; Future -     ondansetron (ZOFRAN-ODT) 8 MG disintegrating tablet; Take 1 tablet (8 mg total) by mouth every 8 (eight) hours as needed for nausea or vomiting.  PAD (peripheral artery disease) (HCC) -     VAS [name] ABI WITH/WO TBI; Future  Insulin-requiring or dependent type II diabetes mellitus ([place]) -     HM Diabetes Foot Exam   I am having Nicholas Whitehead start on [place] and ondansetron. I am also having him maintain his cetirizine, aspirin EC, Cholecalciferol, Lancets, Accu-Chek Guide, B-D SINGLE USE SWABS REGULAR, Gvoke HypoPen 2-Pack, traZODone, Nebivolol HCl, Bevespi Aerosphere, albuterol, Ozempic (1 MG/DOSE), mupirocin ointment, Glucagon Emergency, olmesartan, DULoxetine, Toujeo SoloStar, Odefsey, Livalo, mirtazapine, torsemide, Farxiga, FreeStyle [place] 3 Sensor, [employer] 3 Reader, gabapentin, pioglitazone, B-D ULTRAFINE III SHORT PEN, HumaLOG KwikPen, and Belsomra.  Meds ordered this encounter  Medications   Omadacycline Tosylate (NUZYRA) 150 MG TABS    Sig: Take 2 tablets (300 mg total) by mouth daily for 14 days.    Dispense:  28 tablet    Refill:  0   ondansetron (ZOFRAN-ODT) 8 MG disintegrating tablet    Sig: Take 1 tablet (8 mg total) by mouth every 8 (eight) hours as needed for nausea or vomiting.    Dispense:  35 tablet    Refill:  0     Follow-up: Return in about 3 weeks (around 03/30/2022).  Scarlette Calico, MD

## 2022-03-09 NOTE — Patient Instructions (Addendum)
NUZRYA ------  Always take on an empty stomach.  Take 3 tablets today with water and 3 tablets tomorrow.  After that, [facility] [place].  For an additional 2 weeks you will take 2 tablets once a day.  In the meantime, I will check the blood flow in your foot and we will get an MRI to confirm that the toe is infected.   Osteomyelitis, Adult  Bone infections, also called osteomyelitis,occur when bacteria or other germs get inside a bone. This can happen if you have an infection in another part of your body that spreads through your blood. It can also happen if you have a wound or a broken bone (fracture) that breaks the skin. A wound or a fracture can allow germs from your skin or from outside of your body to spread to your bone. Bone infections need to be treated quickly to: Prevent bone damage. Prevent the infection from spreading to other areas of your body. What are the causes? Most bone infections are caused by bacteria. The most common bacteria is one found on the skin (staphylococcus). Bone infections can also be caused by other germs, such as viruses and funguses. What increases the risk? You are more likely to develop this condition if: You recently had surgery, especially bone or joint surgery. You have had an injury, such as stepping on a nail or having a fracture that exposes bones through the skin. You have a long-term (chronic) disease, such as: Diabetes. HIV (human immunodeficiency virus). Rheumatoid arthritis. Sickle cell anemia. Kidney disease that requires dialysis. You have a condition that affects your body's defense system (immune system) or you take medicines that block or weaken the immune system. You have a condition that reduces your blood flow. You have an artificial joint. You have had a joint or bone repaired with plates or screws. You use IV drugs. What are the signs or symptoms? Symptoms vary depending on the type and  location of your infection. Common symptoms of bone infections include: Fever and chills. Skin redness and warmth. Swelling. Pain and stiffness. Drainage of fluid or pus near the infection. How is this diagnosed? This condition may be diagnosed based on: Your symptoms and medical history. A physical exam. Tests, such as: A sample of tissue, fluid, or blood taken to be examined under a microscope. Pus or discharge swabbed from a wound for testing. This is to identify the type of germs and to determine what type of medicine will kill them. Blood tests. Imaging studies, including X-rays, MRI, CT scan, bone scan, or ultrasound. How is this treated? Treatment for this condition depends on the cause and type of infection. Antibiotic medicines are usually the first treatment for a bone infection. This may be done in a hospital at first. You may have to continue IV antibiotics at home or take antibiotics by mouth for several weeks after that. Other treatments may include surgery to: Remove dead or dying tissue from a bone. Remove an infected artificial joint. Remove infected plates or screws that were used to repair a broken bone. Follow these instructions at home: Medicines Take over-the-counter and prescription medicines as told by your health care provider. Finish all antibiotic medicine even if you start to feel better. Follow instructions from your health care provider about how to take IV antibiotics at home. You may need to have a nurse come to your home to give you the IV antibiotics. Managing pain, stiffness, and  swelling If directed, put ice on the affected area. To do this: Put ice in a plastic bag. Place a towel between your skin and the bag. Leave the ice on for 20 minutes, 2-3 times a day.  General instructions Ask your health care provider if you have any restrictions on your activities. Do not use any products that contain nicotine or tobacco, such as cigarettes,  e-cigarettes, and chewing tobacco. If you need help quitting, ask your health care provider. Keep all follow-up visits as told by your health care provider. This is important. How is this prevented? Wash your hands often to stop the spread of germs. Wash your hands for at least 20 seconds with soap and water. If soap and water are not available, use hand sanitizer. Keep any open areas, cuts, or wounds clean. Apply a clean bandage after cleaning the area. Check wounds frequently for signs of infections. Signs of infection include redness, swelling, warmth, pus, or a bad smell. Wear proper footwear to avoid injuries to the feet. Contact a health care provider if: You develop a fever or chills. You have redness, warmth, pain, or swelling that returns after treatment. Get help right away if: You have rapid breathing or you have trouble breathing. You have chest pain. You cannot drink fluids or make urine. The affected area swells, changes color, or turns blue. You have numbness or severe pain in the affected area. These symptoms may represent a serious problem that is an emergency. Do not wait to see if the symptoms will go away. Get medical help right away. Call your local emergency services (911 in the [place] Do not drive yourself to the hospital. Summary Bone infections, also called osteomyelitis,occur when bacteria or other germs get inside a bone. You are more likely to get this type of infection if you have a condition that lowers your ability to fight infections. You are also likely to get this condition if you take medicines that block or weaken the immune system. Most bone infections are caused by bacteria. They can also be caused by other germs, such as viruses and funguses. Treatment for this condition usually starts with taking antibiotics. Further treatment depends on the cause and type of infection. This information is not intended to replace advice given to you by your health care  provider. Make sure you discuss any questions you have with your health care provider. Document Revised: 03/13/2019 Document Reviewed: 03/13/2019 Elsevier Patient Education  [place].

## 2022-03-12 ENCOUNTER — Ambulatory Visit
Admission: RE | Admit: 2022-03-12 | Discharge: 2022-03-12 | Disposition: A | Discharge status: Home / Self Care | Payer: 59 | Source: Ambulatory Visit | Attending: Internal Medicine | Admitting: Internal Medicine

## 2022-03-12 DIAGNOSIS — M86141 Other acute osteomyelitis, right hand: Secondary | ICD-10-CM | POA: Diagnosis not present

## 2022-03-12 DIAGNOSIS — M86171 Other acute osteomyelitis, right ankle and foot: Secondary | ICD-10-CM

## 2022-03-12 DIAGNOSIS — E11628 Type 2 diabetes mellitus with other skin complications: Secondary | ICD-10-CM

## 2022-03-12 MED ORDER — GADOPICLENOL 0.5 MMOL/ML IV SOLN
9.0000 mL | Freq: Once | INTRAVENOUS | Status: AC | PRN
  Administered 2022-03-12: 9 mL via INTRAVENOUS

## 2022-03-13 ENCOUNTER — Telehealth: Payer: Self-pay | Admitting: Podiatry

## 2022-03-13 NOTE — Telephone Encounter (Signed)
Prior auth for toe filler [id number] approved for diabetic shoes and inserts, as well as toe filler .   Copy being sent to scan center

## 2022-03-15 ENCOUNTER — Ambulatory Visit (HOSPITAL_COMMUNITY)
Admission: RE | Admit: 2022-03-15 | Discharge: 2022-03-15 | Disposition: A | Discharge status: Home / Self Care | Payer: 59 | Source: Ambulatory Visit | Attending: Internal Medicine | Admitting: Internal Medicine

## 2022-03-15 DIAGNOSIS — L089 Local infection of the skin and subcutaneous tissue, unspecified: Secondary | ICD-10-CM | POA: Diagnosis not present

## 2022-03-15 DIAGNOSIS — M79674 Pain in right toe(s): Secondary | ICD-10-CM | POA: Diagnosis not present

## 2022-03-15 DIAGNOSIS — I739 Peripheral vascular disease, unspecified: Secondary | ICD-10-CM | POA: Insufficient documentation

## 2022-03-15 DIAGNOSIS — E11628 Type 2 diabetes mellitus with other skin complications: Secondary | ICD-10-CM | POA: Insufficient documentation

## 2022-03-15 LAB — VAS US ABI WITH/WO TBI
Left ABI: 0.91
Right ABI: 1.17

## 2022-03-20 ENCOUNTER — Telehealth: Payer: Self-pay | Admitting: Internal Medicine

## 2022-03-20 ENCOUNTER — Other Ambulatory Visit: Payer: Self-pay | Admitting: Internal Medicine

## 2022-03-20 DIAGNOSIS — L089 Local infection of the skin and subcutaneous tissue, unspecified: Secondary | ICD-10-CM

## 2022-03-20 DIAGNOSIS — M86171 Other acute osteomyelitis, right ankle and foot: Secondary | ICD-10-CM

## 2022-03-20 MED ORDER — NUZYRA 150 MG PO TABS: 2.0000 | ORAL_TABLET | Freq: Every day | ORAL | 0 refills | Status: AC

## 2022-03-20 NOTE — Telephone Encounter (Signed)
[facility] - they need a list of tried and failed medications in order to process the [employer].  Please call them at :  [phone]

## 2022-03-21 NOTE — Telephone Encounter (Signed)
[facility] and will send Key code once done.

## 2022-03-22 ENCOUNTER — Other Ambulatory Visit: Payer: Self-pay | Admitting: Internal Medicine

## 2022-03-22 DIAGNOSIS — J410 Simple chronic bronchitis: Secondary | ICD-10-CM

## 2022-03-23 ENCOUNTER — Ambulatory Visit (INDEPENDENT_AMBULATORY_CARE_PROVIDER_SITE_OTHER): Payer: 59 | Admitting: Podiatry

## 2022-03-23 DIAGNOSIS — L97511 Non-pressure chronic ulcer of other part of right foot limited to breakdown of skin: Secondary | ICD-10-CM | POA: Diagnosis not present

## 2022-03-23 DIAGNOSIS — M86171 Other acute osteomyelitis, right ankle and foot: Secondary | ICD-10-CM | POA: Diagnosis not present

## 2022-03-23 NOTE — Progress Notes (Signed)
Subjective:  Patient ID: Nicholas Whitehead, male    DOB: January 12, 1955,  MRN: [id number]  Chief Complaint  Patient presents with   Diabetes    Diabetic foot infection [facility]) Referring Provider: [name] - right great toe, looks like a blood blister or pre-ulcerative callus - PCP started him on antibiotics    67 y.o. male presents with the above complaint. History confirmed with patient.  He returns for follow-up for a new issue on the right great toe, developed a blood blister a couple weeks ago.  His primary care doctor took radiographs, there was a small erosion noted and was concerning for osteomyelitis, MRI confirmed this.  He is currently taking Nuzyra twice daily.  Objective:  Physical Exam: Right foot is warm and well-perfused, he has palpable pulses, diffuse neuropathy, there is a blood blister present on the distal hallux, dystrophy of the nail plate.,  No cellulitis no purulence no drainage.  Debridement of the ulceration shows intact dermis, no sinus tract formation.  No exposed bone tendon or joint.     Study Result  Narrative & Impression  CLINICAL DATA:  Pain   EXAM: RIGHT GREAT TOE   COMPARISON:  Radiographs of right foot done on 05/25/2020 in outside institution   FINDINGS: No recent fracture or dislocation is seen. There is interval fusion of interphalangeal joint. Degenerative changes were noted in the interphalangeal joint in the previous study. There is small defect in the medial cortical margin of tip of distal phalanx. Bony spurs are noted in first metatarsophalangeal joint. There are no opaque foreign bodies.   IMPRESSION: There is small cortical defect in the medial aspect of tip of distal phalanx which may be residual from previous injury or suggest osteomyelitis. If clinically warranted, follow-up MRI may be considered.   No recent fracture or dislocation is seen. There is fusion of interphalangeal joint.     Electronically Signed   By:  Elmer Picker M.D.   On: 03/09/2022 12:07    Narrative & Impression  CLINICAL DATA:  Right great toe ulcer, concern for osteomyelitis   EXAM: MRI OF THE RIGHT TOES WITHOUT AND WITH CONTRAST   TECHNIQUE: Multiplanar, multisequence MR imaging of the right forefoot was performed both before and after administration of intravenous contrast.   CONTRAST:  9 mL of Vueway   COMPARISON:  Radiographs dated March 09, 2022   FINDINGS: Bones/Joint/Cartilage   Bone marrow edema of the distal phalanx of the first digit with osseous erosion consistent with osteomyelitis. There is advanced arthritis/ankylosis at the interphalangeal joint of the first digit. Marrow signal within remaining osseous structures is within normal limits   Ligaments   Lisfranc and collateral ligaments are intact   Muscles and Tendons   Intrinsic muscles of the foot are normal in caliber. Flexor and extensor tendons appear intact.   Soft tissues   Skin irregularity about the medial aspect of the the distal phalanx of the first digit. No fluid collection or abscess.   IMPRESSION: 1. Osteomyelitis of the distal phalanx of the first digit. 2. Advanced arthritis/ankylosis at the interphalangeal joint of the first digit. 3. Skin irregularity about the medial aspect of the distal phalanx of the first digit. No fluid collection or abscess.     Electronically Signed   By: Keane Police D.O.   On: 03/12/2022 14:29      Assessment:   1. Acute osteomyelitis of toe of right foot ([place])   2. Ulcer of toe of right foot, limited  to breakdown of skin [facility])      Plan:  Patient was evaluated and treated and all questions answered.  I independently reviewed the reports and images from the MRI and radiographs of the right great toe.  The erosion noted is in the area of previous pin placement from previous IPJ fusion, however does seem to be progress since his last x-rays after his bone has healed.  Do not  think this is likely postsurgical changes, and this would not explain the hyperintensity on the T2 images on his MRI.  I do recommend treating as osteomyelitis.  However clinically he does not have any sinus tract, draining wound cellulitis or purulence.  Overall the appearance of this is relatively benign.  I suspect this osteomyelitis is relatively early in its course.  I do not think we should necessarily jump straight to amputation of the digit, I think with local wound care and antibiotic therapy he likely has a chance of needing this.  He has been placed on [place], which has excellent bio availability and bone.  He has mupirocin ointment at home and will use this daily.  I will see him back in 2 weeks to reevaluate.  I recommended checking baseline ESR and CBC and this was ordered.  No follow-ups on file.

## 2022-03-28 ENCOUNTER — Other Ambulatory Visit: Payer: Self-pay | Admitting: Internal Medicine

## 2022-03-28 DIAGNOSIS — Z794 Long term (current) use of insulin: Secondary | ICD-10-CM

## 2022-03-28 DIAGNOSIS — E1140 Type 2 diabetes mellitus with diabetic neuropathy, unspecified: Secondary | ICD-10-CM

## 2022-03-28 DIAGNOSIS — E1165 Type 2 diabetes mellitus with hyperglycemia: Secondary | ICD-10-CM

## 2022-03-28 MED ORDER — ACCU-CHEK GUIDE VI STRP: 1.0000 | ORAL_STRIP | Freq: Four times a day (QID) | 1 refills | Status: DC

## 2022-03-28 MED ORDER — FREESTYLE LIBRE 3 READER DEVI: 1.0000 | Freq: Every day | 5 refills | Status: DC

## 2022-03-28 MED ORDER — FREESTYLE LIBRE 3 SENSOR MISC: 1.0000 | Freq: Every day | 5 refills | Status: DC

## 2022-04-02 ENCOUNTER — Other Ambulatory Visit: Payer: Self-pay | Admitting: Endocrinology

## 2022-04-02 DIAGNOSIS — E1165 Type 2 diabetes mellitus with hyperglycemia: Secondary | ICD-10-CM

## 2022-04-06 ENCOUNTER — Ambulatory Visit (INDEPENDENT_AMBULATORY_CARE_PROVIDER_SITE_OTHER): Payer: 59 | Admitting: Podiatry

## 2022-04-06 DIAGNOSIS — L97511 Non-pressure chronic ulcer of other part of right foot limited to breakdown of skin: Secondary | ICD-10-CM | POA: Diagnosis not present

## 2022-04-06 DIAGNOSIS — M86171 Other acute osteomyelitis, right ankle and foot: Secondary | ICD-10-CM

## 2022-04-06 NOTE — Progress Notes (Signed)
Subjective:  Patient ID: Nicholas Whitehead, male    DOB: Apr 18, 1955,  MRN: [id number]  Chief Complaint  Patient presents with   Osteomyelitis     2 week follow up right toe    67 y.o. male presents with the above complaint. History confirmed with patient.  He returns for follow-up on the right great toe, the antibiotics are giving him problems with nausea and feels like his bowel habits are interrupted.  Objective:  Physical Exam: Right foot is warm and well-perfused, he has palpable pulses, diffuse neuropathy, there is a blood blister present on the distal hallux, dystrophy of the nail plate.,  No cellulitis no purulence no drainage.  Debridement of the ulceration measures 0.6 x 0.3 x 0.3 cm, some exposed subcutaneous tissue, no exposed bone tendon or joint, significant improvement in appearance     Study Result  Narrative & Impression  CLINICAL DATA:  Pain   EXAM: RIGHT GREAT TOE   COMPARISON:  Radiographs of right foot done on 05/25/2020 in outside institution   FINDINGS: No recent fracture or dislocation is seen. There is interval fusion of interphalangeal joint. Degenerative changes were noted in the interphalangeal joint in the previous study. There is small defect in the medial cortical margin of tip of distal phalanx. Bony spurs are noted in first metatarsophalangeal joint. There are no opaque foreign bodies.   IMPRESSION: There is small cortical defect in the medial aspect of tip of distal phalanx which may be residual from previous injury or suggest osteomyelitis. If clinically warranted, follow-up MRI may be considered.   No recent fracture or dislocation is seen. There is fusion of interphalangeal joint.     Electronically Signed   By: Elmer Picker M.D.   On: 03/09/2022 12:07    Narrative & Impression  CLINICAL DATA:  Right great toe ulcer, concern for osteomyelitis   EXAM: MRI OF THE RIGHT TOES WITHOUT AND WITH CONTRAST    TECHNIQUE: Multiplanar, multisequence MR imaging of the right forefoot was performed both before and after administration of intravenous contrast.   CONTRAST:  9 mL of Vueway   COMPARISON:  Radiographs dated March 09, 2022   FINDINGS: Bones/Joint/Cartilage   Bone marrow edema of the distal phalanx of the first digit with osseous erosion consistent with osteomyelitis. There is advanced arthritis/ankylosis at the interphalangeal joint of the first digit. Marrow signal within remaining osseous structures is within normal limits   Ligaments   Lisfranc and collateral ligaments are intact   Muscles and Tendons   Intrinsic muscles of the foot are normal in caliber. Flexor and extensor tendons appear intact.   Soft tissues   Skin irregularity about the medial aspect of the the distal phalanx of the first digit. No fluid collection or abscess.   IMPRESSION: 1. Osteomyelitis of the distal phalanx of the first digit. 2. Advanced arthritis/ankylosis at the interphalangeal joint of the first digit. 3. Skin irregularity about the medial aspect of the distal phalanx of the first digit. No fluid collection or abscess.     Electronically Signed   By: Keane Police D.O.   On: 03/12/2022 14:29      Assessment:   1. Acute osteomyelitis of toe of right foot ([place])   2. Ulcer of toe of right foot, limited to breakdown of skin [facility])      Plan:  Patient was evaluated and treated and all questions answered.  Clinical anticoagulated improvement.  I do think the Elesa Hacker is helping quite a bit.  I  understand he is having some nausea with it but this has been controlled by p.o. ondansetron.  The wound was debrided of surrounding hyperkeratosis today, drainage has reduced quite a bit.  Continue application of mupirocin ointment daily.  I will see him back in 3 weeks for wound care.  Hopefully has enough improvement that point, we will take new radiographs as well. Return in about 3  weeks (around 04/27/2022) for 3 week wound care.

## 2022-04-10 ENCOUNTER — Other Ambulatory Visit: Payer: Self-pay | Admitting: Internal Medicine

## 2022-04-10 ENCOUNTER — Telehealth: Payer: Self-pay | Admitting: Internal Medicine

## 2022-04-10 DIAGNOSIS — E1140 Type 2 diabetes mellitus with diabetic neuropathy, unspecified: Secondary | ICD-10-CM

## 2022-04-10 NOTE — Telephone Encounter (Signed)
PT calls today requesting an urgent CB from Dr.Jones/clinic staff regarding one of their prescriptions. (PT did not elaborate on which medication or exactly what was going on).   CB: [phone]

## 2022-04-10 NOTE — Telephone Encounter (Signed)
Pt wondering if Dr. [name] has any samples of nuzyra he can have until his shipment from [place]. Stated he took his last dose yesterday morning.

## 2022-04-13 ENCOUNTER — Other Ambulatory Visit: Payer: Self-pay | Admitting: Internal Medicine

## 2022-04-13 DIAGNOSIS — E1140 Type 2 diabetes mellitus with diabetic neuropathy, unspecified: Secondary | ICD-10-CM

## 2022-04-13 DIAGNOSIS — F32A Depression, unspecified: Secondary | ICD-10-CM

## 2022-04-13 NOTE — Telephone Encounter (Signed)
Patient called and states that a prior authorization needs to be done on this rx.

## 2022-04-14 ENCOUNTER — Telehealth: Payer: Self-pay | Admitting: Internal Medicine

## 2022-04-14 ENCOUNTER — Telehealth: Payer: Self-pay

## 2022-04-14 ENCOUNTER — Other Ambulatory Visit (HOSPITAL_COMMUNITY): Payer: Self-pay

## 2022-04-14 NOTE — Telephone Encounter (Signed)
PA wants to know the diagnosis of the medication Nuzyra 150 mg and is this an acute bacteria skin infection.

## 2022-04-14 NOTE — Telephone Encounter (Signed)
Pharmacy Patient Advocate Encounter   Received notification that prior authorization for Nuzyra 150MG  tablets is required/requested.  Per Test Claim: PA required   PA submitted on 04/14/22 to (ins) [employer] via CoverMyMeds Key or (Medicaid) confirmation # [id number] Status is pending

## 2022-04-14 NOTE — Telephone Encounter (Signed)
PA submitted via CMM. Key: [id number]. Status pending

## 2022-04-14 NOTE — Telephone Encounter (Signed)
error 

## 2022-04-14 NOTE — Telephone Encounter (Signed)
Patient called and said he is going to run out of his medication before the prior authorization is approved. He would like to know if he can get any more samples until then. Best call back is [phone].

## 2022-04-17 ENCOUNTER — Other Ambulatory Visit: Payer: Self-pay | Admitting: Internal Medicine

## 2022-04-17 DIAGNOSIS — Z794 Long term (current) use of insulin: Secondary | ICD-10-CM

## 2022-04-17 DIAGNOSIS — I10 Essential (primary) hypertension: Secondary | ICD-10-CM

## 2022-04-17 NOTE — Telephone Encounter (Signed)
Patient called and said he has run out of Omadacycline Tosylate (NUZYRA) 150 MG TABS and would like to know what Dr. Yetta Barre would like for him to do. He would like a call back at [phone].

## 2022-04-17 NOTE — Telephone Encounter (Signed)
How does the toe look?

## 2022-04-18 ENCOUNTER — Encounter: Payer: Self-pay | Admitting: Internal Medicine

## 2022-04-18 ENCOUNTER — Ambulatory Visit (INDEPENDENT_AMBULATORY_CARE_PROVIDER_SITE_OTHER): Payer: 59 | Admitting: Internal Medicine

## 2022-04-18 VITALS — BP 128/64 | HR 77 | Temp 98.2°F | Ht 73.0 in | Wt 194.0 lb

## 2022-04-18 DIAGNOSIS — I10 Essential (primary) hypertension: Secondary | ICD-10-CM

## 2022-04-18 DIAGNOSIS — E11628 Type 2 diabetes mellitus with other skin complications: Secondary | ICD-10-CM

## 2022-04-18 DIAGNOSIS — L089 Local infection of the skin and subcutaneous tissue, unspecified: Secondary | ICD-10-CM | POA: Diagnosis not present

## 2022-04-18 DIAGNOSIS — M86171 Other acute osteomyelitis, right ankle and foot: Secondary | ICD-10-CM

## 2022-04-18 NOTE — Progress Notes (Signed)
Subjective:  Patient ID: Nicholas Whitehead, male    DOB: Feb 09, 1955  Age: 67 y.o. MRN: [id number]  CC: Hypertension and Diabetes   HPI Nicholas HockLeroy Laughridge presents for f/up ---  He returns for follow-up regarding osteomyelitis/diabetic foot infection involving the left great toe.  Unfortunately, he tells me his insurance company will not pay for any more [place]ra and he would like to continue for another week.  The area is healing with no redness, swelling, drainage, or pain.  Outpatient Medications Prior to Visit  Medication Sig Dispense Refill   ACCU-CHEK GUIDE test strip 1 each by Other route 4 (four) times daily. TEST BLOOD SUGAR FOUR TIMES DAILY 400 strip 1   albuterol (VENTOLIN HFA) 108 (90 Base) MCG/ACT inhaler INHALE 2 PUFFS INTO THE LUNGS EVERY 6 HOURS AS NEEDED FOR WHEEZING OR SHORTNESS OF BREATH 6.7 g 3   Alcohol Swabs (B-D SINGLE USE SWABS REGULAR) PADS USE AS DIRECTED 300 each 1   aspirin EC 81 MG tablet Take 81 mg by mouth every morning.     BELSOMRA 20 MG TABS Take 1 tablet by mouth at bedtime as needed.     cetirizine (ZYRTEC) 10 MG tablet Take 10 mg by mouth daily.     Cholecalciferol 2000 units TABS Take 1 tablet (2,000 Units total) by mouth daily. 90 tablet 1   Continuous Blood Gluc Receiver (FREESTYLE LIBRE 3 READER) DEVI 1 Act by Does not apply route daily. 2 each 5   Continuous Blood Gluc Sensor (FREESTYLE LIBRE 3 SENSOR) MISC 1 Act by Does not apply route daily. Place 1 sensor on the skin every 14 days. Use to check glucose continuously 2 each 5   DULoxetine (CYMBALTA) 60 MG capsule TAKE 1 CAPSULE(60 MG) BY MOUTH DAILY 90 capsule 1   emtricitabine-rilpivir-tenofovir AF (ODEFSEY) 200-25-25 MG TABS tablet Take 1 tablet by mouth daily. 90 tablet 3   FARXIGA 10 MG TABS tablet TAKE 1 TABLET BY MOUTH DAILY BEFORE BREAKFAST 90 tablet 0   gabapentin (NEURONTIN) 300 MG capsule TAKE 1 CAPSULE BY MOUTH THREE TIMES DAILY 270 capsule 1   Glucagon (GVOKE HYPOPEN 2-PACK) 1 MG/0.2ML SOAJ  Inject 1 Act into the skin daily as needed. 2 mL 6   Glycopyrrolate-Formoterol (BEVESPI AEROSPHERE) 9-4.8 MCG/ACT AERO Inhale 2 puffs into the lungs 2 (two) times daily. 32.1 g 1   HUMALOG KWIKPEN 100 UNIT/ML KwikPen ADMINISTER 25 TO 30 UNITS UNDER THE SKIN THREE TIMES DAILY 90 mL 1   insulin glargine, 1 Unit Dial, (TOUJEO SOLOSTAR) 300 UNIT/ML Solostar Pen ADMINISTER 44 UNITS UNDER THE SKIN DAILY 6 mL 1   Insulin Pen Needle (B-D ULTRAFINE III SHORT PEN) 31G X 8 MM MISC USE TO INJECT INSULN AS DIRECTED THREE TIMES DAILY PER HUMALOG PRESCRIPTION 500 each 1   Lancets MISC Use to test blood sugar tid. DX: E11.8 300 each 3   mirtazapine (REMERON) 7.5 MG tablet Take 1 tablet (7.5 mg total) by mouth at bedtime. 90 tablet 0   mupirocin ointment (BACTROBAN) 2 % Apply 1 Application topically 2 (two) times daily. 30 g 2   Nebivolol HCl 20 MG TABS TAKE 1 TABLET(20 MG) BY MOUTH DAILY 90 tablet 1   olmesartan (BENICAR) 40 MG tablet TAKE 1 TABLET(40 MG) BY MOUTH DAILY 90 tablet 1   Omadacycline Tosylate (NUZYRA) 150 MG TABS Take 2 tablets (300 mg total) by mouth daily. 60 tablet 0   ondansetron (ZOFRAN-ODT) 8 MG disintegrating tablet Take 1 tablet (8 mg total) by mouth every  8 (eight) hours as needed for nausea or vomiting. 35 tablet 0   pioglitazone (ACTOS) 30 MG tablet TAKE 1 TABLET(30 MG) BY MOUTH DAILY 90 tablet 1   Pitavastatin Calcium (LIVALO) 1 MG TABS TAKE 1 TABLET(1 MG) BY MOUTH DAILY 90 tablet 1   Semaglutide, 1 MG/DOSE, (OZEMPIC, 1 MG/DOSE,) 4 MG/3ML SOPN Inject 1 mg weekly 3 mL 5   torsemide (DEMADEX) 20 MG tablet Take 1 tablet (20 mg total) by mouth every Monday, Wednesday, and Friday. 36 tablet 0   traZODone (DESYREL) 150 MG tablet TAKE 3 TABLETS(450 MG) BY MOUTH AT BEDTIME 270 tablet 1   Glucagon, rDNA, (GLUCAGON EMERGENCY) 1 MG KIT Inject 1 mg into the skin once for 1 dose. 1 kit 5   No facility-administered medications prior to visit.    ROS Review of Systems  Constitutional:  Negative for  chills, fatigue and fever.  HENT: Negative.    Eyes: Negative.   Respiratory: Negative.  Negative for cough, chest tightness, shortness of breath and wheezing.   Gastrointestinal:  Negative for abdominal pain, diarrhea, nausea and vomiting.  Endocrine: Negative.   Genitourinary:  Negative for difficulty urinating.  Musculoskeletal:  Positive for arthralgias. Negative for myalgias.  Skin: Negative.  Negative for color change.  Neurological: Negative.  Negative for dizziness and weakness.  Hematological:  Negative for adenopathy. Does not bruise/bleed easily.  Psychiatric/Behavioral: Negative.      Objective:  BP 128/64 (BP Location: Right Arm, Patient Position: Sitting, Cuff Size: Large)   Pulse 77   Temp 98.2 F (36.8 C) (Oral)   Ht 6\' 1"  (1.854 m)   Wt 194 lb (88 kg)   SpO2 93%   BMI 25.60 kg/m   BP Readings from Last 3 Encounters:  04/18/22 128/64  03/09/22 120/68  02/07/22 119/76    Wt Readings from Last 3 Encounters:  04/18/22 194 lb (88 kg)  03/09/22 190 lb (86.2 kg)  02/07/22 190 lb (86.2 kg)    Physical Exam Vitals reviewed.  Constitutional:      General: He is not in acute distress.    Appearance: He is not ill-appearing, toxic-appearing or diaphoretic.  HENT:     Nose: Nose normal.     Mouth/Throat:     Mouth: Mucous membranes are moist.  Eyes:     General: No scleral icterus.    Conjunctiva/sclera: Conjunctivae normal.  Cardiovascular:     Rate and Rhythm: Normal rate and regular rhythm.     Pulses:          Dorsalis pedis pulses are 1+ on the right side and 1+ on the left side.       Posterior tibial pulses are 1+ on the right side and 1+ on the left side.     Heart sounds: No murmur heard. Pulmonary:     Effort: Pulmonary effort is normal.     Breath sounds: No stridor. No wheezing, rhonchi or rales.  Abdominal:     General: Abdomen is flat.     Palpations: There is no mass.     Tenderness: There is no abdominal tenderness. There is no  guarding.     Hernia: No hernia is present.  Musculoskeletal:     Cervical back: Neck supple.  Feet:     Left foot:     Skin integrity: Ulcer and skin breakdown present.     Comments: The tip of the left great toe has healed with callus and granulation tissue. Lymphadenopathy:     Cervical:  No cervical adenopathy.     Lab Results  Component Value Date   WBC 9.0 12/08/2021   HGB 15.8 12/08/2021   HCT 47.0 12/08/2021   PLT 192.0 12/08/2021   GLUCOSE 237 (H) 01/24/2022   CHOL 165 01/24/2022   TRIG 170 (H) 01/24/2022   HDL 43 01/24/2022   LDLCALC 95 01/24/2022   ALT 22 01/24/2022   AST 22 01/24/2022   NA 137 01/24/2022   K 4.2 01/24/2022   CL 102 01/24/2022   CREATININE 1.06 01/24/2022   BUN 13 01/24/2022   CO2 28 01/24/2022   TSH 1.05 12/08/2021   PSA 1.72 12/08/2021   HGBA1C 9.2 (A) 02/02/2022   MICROALBUR <0.7 04/29/2021    VAS US ABI WITH/WO TBI  Result Date: 03/15/2022  LOWER EXTREMITY DOPPLER STUDY Patient Name:  Nicholas HockLEROY Inclan  Date of Exam:   03/15/2022 Medical Rec #: [id number]      Accession #:    [id number] Date of Birth: [birth year]     Patient Gender: M Patient Age:   [age] years [facility]am [facility] Procedure:      VAS US ABI WITH/WO TBI Referring Phys: Sanda LingerHOMAS Sydney Hasten --------------------------------------------------------------------------------  Indications: Peripheral artery disease. Pain of right great toe High Risk Factors: Hypertension, hyperlipidemia, Diabetes.  Comparison Study: 10/16/19 RT ABI 1.24 LT ABI 1.15 Performing Technologist: Argentina PonderMegan Stricklin RVS  Examination Guidelines: A complete evaluation includes at minimum, Doppler waveform signals and systolic blood pressure reading at the level of bilateral brachial, anterior tibial, and posterior tibial arteries, when vessel segments are accessible. Bilateral testing is considered an integral part of a complete examination. Photoelectric Plethysmograph (PPG) waveforms and toe systolic pressure  readings are included as required and additional duplex testing as needed. Limited examinations for reoccurring indications may be performed as noted.  ABI Findings: +---------+------------------+-----+---------+--------+ Right    Rt Pressure (mmHg)IndexWaveform Comment  +---------+------------------+-----+---------+--------+ Brachial 125                                      +---------+------------------+-----+---------+--------+ PTA      133               1.06 triphasic         +---------+------------------+-----+---------+--------+ DP       146               1.17 triphasic         +---------+------------------+-----+---------+--------+ Great Toe114               0.91 Normal            +---------+------------------+-----+---------+--------+ +---------+------------------+-----+---------+-------+ Left     Lt Pressure (mmHg)IndexWaveform Comment +---------+------------------+-----+---------+-------+ Brachial 118                                     +---------+------------------+-----+---------+-------+ PTA      150               1.20 triphasic        +---------+------------------+-----+---------+-------+ DP       131               1.05 triphasic        +---------+------------------+-----+---------+-------+ Great Toe91                0.73 Normal           +---------+------------------+-----+---------+-------+ +-------+-----------+-----------+------------+------------+  ABI/TBIToday's ABIToday's TBIPrevious ABIPrevious TBI +-------+-----------+-----------+------------+------------+ Right  1.17       1.20                                +-------+-----------+-----------+------------+------------+ Left   0.91       0.73                                +-------+-----------+-----------+------------+------------+  Summary: Right: Resting right ankle-brachial index is within normal range. The right toe-brachial index is normal. Left: Resting left  ankle-brachial index is within normal range. The left toe-brachial index is normal. *See table(s) above for measurements and observations.  Electronically signed by Lemar Livings MD on 03/15/2022 at 2:04:00 PM.    Final     Assessment & Plan:   Diabetic foot infection- Improvement noted. Riki Altes; Take 2 tablets (300 mg total) by mouth daily.  Dispense: 12 tablet; Refill: 0  Acute osteomyelitis of toe of right foot- I gave him samples for an additional 6 days of Nuzyra. Riki Altes; Take 2 tablets (300 mg total) by mouth daily.  Dispense: 12 tablet; Refill: 0  Primary hypertension- His blood pressure is well-controlled.     Follow-up: Return in about 3 months (around 07/18/2022).  Sanda Linger, MD

## 2022-04-18 NOTE — Telephone Encounter (Signed)
Pt coming in today at 2:20pm.

## 2022-04-18 NOTE — Patient Instructions (Signed)

## 2022-04-20 ENCOUNTER — Other Ambulatory Visit: Payer: Self-pay | Admitting: Internal Medicine

## 2022-04-20 DIAGNOSIS — F5105 Insomnia due to other mental disorder: Secondary | ICD-10-CM

## 2022-04-20 DIAGNOSIS — I10 Essential (primary) hypertension: Secondary | ICD-10-CM

## 2022-04-20 DIAGNOSIS — F419 Anxiety disorder, unspecified: Secondary | ICD-10-CM

## 2022-04-20 DIAGNOSIS — F409 Phobic anxiety disorder, unspecified: Secondary | ICD-10-CM

## 2022-04-20 DIAGNOSIS — F32A Depression, unspecified: Secondary | ICD-10-CM

## 2022-04-21 ENCOUNTER — Other Ambulatory Visit: Payer: Self-pay

## 2022-04-21 DIAGNOSIS — E1165 Type 2 diabetes mellitus with hyperglycemia: Secondary | ICD-10-CM

## 2022-04-21 MED ORDER — TOUJEO SOLOSTAR 300 UNIT/ML ~~LOC~~ SOPN: PEN_INJECTOR | SUBCUTANEOUS | 1 refills | Status: DC

## 2022-04-21 MED ORDER — NUZYRA 150 MG PO TABS: 2.0000 | ORAL_TABLET | Freq: Every day | ORAL | 0 refills | Status: DC

## 2022-04-26 ENCOUNTER — Ambulatory Visit (INDEPENDENT_AMBULATORY_CARE_PROVIDER_SITE_OTHER): Payer: 59 | Admitting: Podiatry

## 2022-04-26 ENCOUNTER — Ambulatory Visit (INDEPENDENT_AMBULATORY_CARE_PROVIDER_SITE_OTHER): Payer: 59

## 2022-04-26 DIAGNOSIS — E1140 Type 2 diabetes mellitus with diabetic neuropathy, unspecified: Secondary | ICD-10-CM | POA: Diagnosis not present

## 2022-04-26 DIAGNOSIS — M2142 Flat foot [pes planus] (acquired), left foot: Secondary | ICD-10-CM

## 2022-04-26 DIAGNOSIS — M2042 Other hammer toe(s) (acquired), left foot: Secondary | ICD-10-CM

## 2022-04-26 DIAGNOSIS — M2141 Flat foot [pes planus] (acquired), right foot: Secondary | ICD-10-CM | POA: Diagnosis not present

## 2022-04-26 DIAGNOSIS — L97511 Non-pressure chronic ulcer of other part of right foot limited to breakdown of skin: Secondary | ICD-10-CM

## 2022-04-26 DIAGNOSIS — Z794 Long term (current) use of insulin: Secondary | ICD-10-CM

## 2022-04-26 DIAGNOSIS — M2041 Other hammer toe(s) (acquired), right foot: Secondary | ICD-10-CM

## 2022-04-29 NOTE — Progress Notes (Signed)
Subjective:  Patient ID: Nicholas Whitehead, male    DOB: 12/28/1955,  MRN: [id number]  Chief Complaint  Patient presents with   Foot Ulcer    3 week wound care/ pick up diabetic shoes    67 y.o. male presents with the above complaint. History confirmed with patient.  He returns for follow-up on the right great toe, he has nearly completed his antibiotics  Objective:  Physical Exam: Right foot is warm and well-perfused, he has palpable pulses, diffuse neuropathy, there is a blood blister present on the distal hallux, dystrophy of the nail plate.,  No cellulitis no purulence no drainage.  Debridement of the ulceration measures 0.3 x 0.1 x 0.1.  Nearly fully healed.  Hyperkeratosis surrounding.     Study Result  Narrative & Impression  CLINICAL DATA:  Pain   EXAM: RIGHT GREAT TOE   COMPARISON:  Radiographs of right foot done on 05/25/2020 in outside institution   FINDINGS: No recent fracture or dislocation is seen. There is interval fusion of interphalangeal joint. Degenerative changes were noted in the interphalangeal joint in the previous study. There is small defect in the medial cortical margin of tip of distal phalanx. Bony spurs are noted in first metatarsophalangeal joint. There are no opaque foreign bodies.   IMPRESSION: There is small cortical defect in the medial aspect of tip of distal phalanx which may be residual from previous injury or suggest osteomyelitis. If clinically warranted, follow-up MRI may be considered.   No recent fracture or dislocation is seen. There is fusion of interphalangeal joint.     Electronically Signed   By: Ernie Avena M.D.   On: 03/09/2022 12:07    Narrative & Impression  CLINICAL DATA:  Right great toe ulcer, concern for osteomyelitis   EXAM: MRI OF THE RIGHT TOES WITHOUT AND WITH CONTRAST   TECHNIQUE: Multiplanar, multisequence MR imaging of the right forefoot was performed both before and after administration of  intravenous contrast.   CONTRAST:  9 mL of Vueway   COMPARISON:  Radiographs dated March 09, 2022   FINDINGS: Bones/Joint/Cartilage   Bone marrow edema of the distal phalanx of the first digit with osseous erosion consistent with osteomyelitis. There is advanced arthritis/ankylosis at the interphalangeal joint of the first digit. Marrow signal within remaining osseous structures is within normal limits   Ligaments   Lisfranc and collateral ligaments are intact   Muscles and Tendons   Intrinsic muscles of the foot are normal in caliber. Flexor and extensor tendons appear intact.   Soft tissues   Skin irregularity about the medial aspect of the the distal phalanx of the first digit. No fluid collection or abscess.   IMPRESSION: 1. Osteomyelitis of the distal phalanx of the first digit. 2. Advanced arthritis/ankylosis at the interphalangeal joint of the first digit. 3. Skin irregularity about the medial aspect of the distal phalanx of the first digit. No fluid collection or abscess.     Electronically Signed   By: Larose Hires D.O.   On: 03/12/2022 14:29   New radiographs taken today show no significant increase in osteolysis or erosions.   Assessment:   1. Ulcer of toe of right foot, limited to breakdown of skin   2. Type 2 diabetes mellitus with diabetic neuropathy, with long-term current use of insulin   3. Pes planus of both feet   4. Acquired hammertoes of both feet      Plan:  Patient was evaluated and treated and all questions answered.  He  is doing much better now that he has completed his antibiotic therapy.  Was starting to have some GI side effects.  Do not see the need for further oral antibiotics this point would like to let it continue to heal.  Would like to see him back in 3 weeks for a wound check hopefully fully healed at that point.  Radiographs do not show any appreciable change in bony erosion.  Return in about 3 weeks (around 05/17/2022)  for wound care.

## 2022-05-01 ENCOUNTER — Other Ambulatory Visit (HOSPITAL_COMMUNITY): Payer: Self-pay

## 2022-05-01 NOTE — Telephone Encounter (Signed)
Patient Advocate Encounter  Received a fax from [employer] Part D regarding Prior Authorization for Nuzyra  tablets.   Key: [id number]  Authorization has been DENIED due to

## 2022-05-02 ENCOUNTER — Other Ambulatory Visit (INDEPENDENT_AMBULATORY_CARE_PROVIDER_SITE_OTHER): Payer: 59

## 2022-05-02 DIAGNOSIS — E1165 Type 2 diabetes mellitus with hyperglycemia: Secondary | ICD-10-CM | POA: Diagnosis not present

## 2022-05-02 DIAGNOSIS — Z794 Long term (current) use of insulin: Secondary | ICD-10-CM | POA: Diagnosis not present

## 2022-05-02 LAB — BASIC METABOLIC PANEL
BUN: 17 mg/dL (ref 6–23)
CO2: 29 mEq/L (ref 19–32)
Calcium: 9.5 mg/dL (ref 8.4–10.5)
Chloride: 100 mEq/L (ref 96–112)
Creatinine, Ser: 1.19 mg/dL (ref 0.40–1.50)
GFR: 64.56 mL/min (ref >60.00)
Glucose, Bld: 195 mg/dL — ABNORMAL HIGH (ref 70–99)
Potassium: 4.4 mEq/L (ref 3.5–5.1)
Sodium: 137 mEq/L (ref 135–145)

## 2022-05-02 LAB — HEMOGLOBIN A1C: Hgb A1c MFr Bld: 8 % — ABNORMAL HIGH (ref 4.6–6.5)

## 2022-05-05 ENCOUNTER — Ambulatory Visit (INDEPENDENT_AMBULATORY_CARE_PROVIDER_SITE_OTHER): Payer: 59 | Admitting: Endocrinology

## 2022-05-05 ENCOUNTER — Encounter: Payer: Self-pay | Admitting: Endocrinology

## 2022-05-05 ENCOUNTER — Other Ambulatory Visit: Payer: Self-pay | Admitting: Internal Medicine

## 2022-05-05 VITALS — BP 118/72 | HR 78 | Wt 197.0 lb

## 2022-05-05 DIAGNOSIS — Z794 Long term (current) use of insulin: Secondary | ICD-10-CM

## 2022-05-05 DIAGNOSIS — E1165 Type 2 diabetes mellitus with hyperglycemia: Secondary | ICD-10-CM | POA: Diagnosis not present

## 2022-05-05 DIAGNOSIS — F330 Major depressive disorder, recurrent, mild: Secondary | ICD-10-CM

## 2022-05-05 DIAGNOSIS — Z7985 Long-term (current) use of injectable non-insulin antidiabetic drugs: Secondary | ICD-10-CM | POA: Diagnosis not present

## 2022-05-05 DIAGNOSIS — E1140 Type 2 diabetes mellitus with diabetic neuropathy, unspecified: Secondary | ICD-10-CM

## 2022-05-05 DIAGNOSIS — F5105 Insomnia due to other mental disorder: Secondary | ICD-10-CM

## 2022-05-05 NOTE — Patient Instructions (Addendum)
Toujeo 25 am and pm if am sugars staying over 160 overnight  Check blood sugars on waking up   Also check blood sugars about 2 hours after meals and do this after different meals by rotation  Recommended blood sugar levels on waking up are 90-130 and about 2 hours after meal is 130-160  Please bring your blood sugar monitor to each visit, thank you

## 2022-05-05 NOTE — Progress Notes (Signed)
Patient ID: Willliam Whitehead, male   DOB: 1955-07-30, 67 y.o.   MRN: [id number]           Patient ID: Nicholas Whitehead, male   DOB: Mar 15, 1955, 68 y.o.   MRN: [id number]   Reason for Appointment: Type II Diabetes follow-up   History of Present Illness   Diagnosis date: 1998    Previous history: He was started on oral agents at diagnosis and around 2006 was put on insulin His blood sugars are usually well controlled when he takes Actos along with his insulin. He had been on Invokana since 04/2013  He requires large doses of insulin especially basal insulin   Recent history:   Insulin regimen: Toujeo: 20 units bid; Novolog 20 units before meals         Oral hypoglycemic drugs: Actos 30 mg, Ozempic 1 weekly, Farxiga 10mg   A1c is 8 vs 9.2  Fructosamine previously 316  Current management, blood sugar patterns and problems as of 10/23: He is having better blood sugars since his last visit when he had been off Ozempic Although he is concerned about side effects from this he is not having any nausea with this alone For some reason not clear why he is not taking metformin but this does not appear to affect his sugars He has taken Toujeo twice a day as recommended at last visit but despite fasting readings mostly around 150-160 he has not increased the doses He has been regular with his mealtime doses but only twice a day lately He is not doing any walking, recently dealing with foot infection His weight appears to have gone up This is despite his having nausea from his antibiotic in the last few weeks  Dinner 8-9 pm     Side effects from medications:  urgency, frequent urination from Invokana   Interpretation of his freestyle libre version 3 download for the last 2 weeks as follows  His blood sugars been somewhat variable and mostly mildly increased with average between about 160-170 at all times Overnight blood sugars are mostly running high at the upper end of the target range with  occasional hyperglycemia; no hypoglycemia overnight Premeal blood sugars are generally the same around 160+ at lunch and dinner His postprandial readings are not showing any sharp increase at any time but has had sporadic days where he has higher readings notably on the 17th and the 20th Highest postprandial readings overall after dinner and bedtime No hypoglycemia  CGM use % of time   2-week average/GV 164  Time in range        %  % Time Above 180 29  % Time above 250 2  % Time Below 70      PRE-MEAL Fasting Lunch Dinner 12 AM Overall  Glucose range:       Averages: 152   170     POST-MEAL PC Breakfast PC Lunch PC Dinner  Glucose range:     Averages: 163 171 169   Prior   CGM use % of time   2-week average/GV 210/37  Time in range 42       %  % Time Above 180 30+28  % Time above 250   % Time Below 70 0     PRE-MEAL Fasting Lunch Dinner Bedtime Overall  Glucose range:       Averages: 179    210   POST-MEAL PC Breakfast PC Lunch PC Dinner  Glucose range:     Averages:  250 192  Meals: Breakfast variable, 9-10 AM Lunch 2-3pm  usually avoiding high-fat meals, dinner at about 6 pm          Dietician visit: Most recent: 2013    Weight control:  Wt Readings from Last 3 Encounters:  05/05/22 197 lb (89.4 kg)  04/18/22 194 lb (88 kg)  03/09/22 190 lb (86.2 kg)          Diabetes labs:  Lab Results  Component Value Date   HGBA1C 8.0 (H) 05/02/2022   HGBA1C 9.2 (A) 02/02/2022   HGBA1C 8.7 (H) 10/24/2021   Lab Results  Component Value Date   MICROALBUR <0.7 04/29/2021   LDLCALC 95 01/24/2022   CREATININE 1.19 05/02/2022   Lab Results  Component Value Date   FRUCTOSAMINE 316 (H) 03/10/2021   FRUCTOSAMINE 367 (H) 02/01/2021   FRUCTOSAMINE 280 09/11/2019   FRUCTOSAMINE 298 (H) 04/28/2019   FRUCTOSAMINE 276 09/13/2018    Other active problems discussed today: See review of systems   Lab on 05/02/2022  Component Date Value Ref Range Status    Sodium 05/02/2022 137  135 - 145 mEq/L Final   Potassium 05/02/2022 4.4  3.5 - 5.1 mEq/L Final   Chloride 05/02/2022 100  96 - 112 mEq/L Final   CO2 05/02/2022 29  19 - 32 mEq/L Final   Glucose, Bld 05/02/2022 195 (H)  70 - 99 mg/dL Final   BUN 16/10/9602 17  6 - 23 mg/dL Final   Creatinine, Ser 05/02/2022 1.19  0.40 - 1.50 mg/dL Final   GFR 54/09/8117 64.56  >60.00 mL/min Final   Calculated using the CKD-EPI Creatinine Equation (2021)   Calcium 05/02/2022 9.5  8.4 - 10.5 mg/dL Final   Hgb J4N MFr Bld 05/02/2022 8.0 (H)  4.6 - 6.5 % Final   Glycemic Control Guidelines for People with Diabetes:Non Diabetic:  <6%Goal of Therapy: <7%Additional Action Suggested:  >8%      Allergies as of 05/05/2022       Reactions   Benazepril Cough   Molds & Smuts    Hard to breathe   Pravastatin Other (See Comments)   myositis   Glipizide Nausea Only        Medication List        Accurate as of May 05, 2022 10:54 AM. If you have any questions, ask your nurse or doctor.          Accu-Chek Guide test strip Generic drug: glucose blood 1 each by Other route 4 (four) times daily. TEST BLOOD SUGAR FOUR TIMES DAILY   albuterol 108 (90 Base) MCG/ACT inhaler Commonly known as: VENTOLIN HFA INHALE 2 PUFFS INTO THE LUNGS EVERY 6 HOURS AS NEEDED FOR WHEEZING OR SHORTNESS OF BREATH   aspirin EC 81 MG tablet Take 81 mg by mouth every morning.   B-D SINGLE USE SWABS REGULAR Pads USE AS DIRECTED   B-D ULTRAFINE III SHORT PEN 31G X 8 MM Misc Generic drug: Insulin Pen Needle USE TO INJECT INSULN AS DIRECTED THREE TIMES DAILY PER HUMALOG PRESCRIPTION   Belsomra 20 MG Tabs Generic drug: Suvorexant Take 1 tablet by mouth at bedtime as needed.   Bevespi Aerosphere 9-4.8 MCG/ACT Aero Generic drug: Glycopyrrolate-Formoterol Inhale 2 puffs into the lungs 2 (two) times daily.   cetirizine 10 MG tablet Commonly known as: ZYRTEC Take 10 mg by mouth daily.   Cholecalciferol 50 MCG (2000 UT)  Tabs Take 1 tablet (2,000 Units total) by mouth daily.   DULoxetine 60 MG capsule Commonly known as:  CYMBALTA TAKE 1 CAPSULE(60 MG) BY MOUTH DAILY   Farxiga 10 MG Tabs tablet Generic drug: dapagliflozin propanediol TAKE 1 TABLET BY MOUTH DAILY BEFORE BREAKFAST   FreeStyle Libre 3 Reader Devi 1 Act by Does not apply route daily.   FreeStyle Libre 3 Sensor Misc 1 Act by Does not apply route daily. Place 1 sensor on the skin every 14 days. Use to check glucose continuously   gabapentin 300 MG capsule Commonly known as: NEURONTIN TAKE 1 CAPSULE BY MOUTH THREE TIMES DAILY   Glucagon Emergency 1 MG Kit Inject 1 mg into the skin once for 1 dose.   Gvoke HypoPen 2-Pack 1 MG/0.2ML Soaj Generic drug: Glucagon Inject 1 Act into the skin daily as needed.   HumaLOG KwikPen 100 UNIT/ML KwikPen Generic drug: insulin lispro ADMINISTER 25 TO 30 UNITS UNDER THE SKIN THREE TIMES DAILY   Lancets Misc Use to test blood sugar tid. DX: E11.8   Livalo 1 MG Tabs Generic drug: Pitavastatin Calcium TAKE 1 TABLET(1 MG) BY MOUTH DAILY   mirtazapine 7.5 MG tablet Commonly known as: REMERON TAKE 1 TABLET(7.5 MG) BY MOUTH AT BEDTIME What changed: See the new instructions. Changed by: Sanda Linger, MD   mupirocin ointment 2 % Commonly known as: BACTROBAN Apply 1 Application topically 2 (two) times daily.   Nebivolol HCl 20 MG Tabs TAKE 1 TABLET(20 MG) BY MOUTH DAILY   Nuzyra 150 MG Tabs Generic drug: Omadacycline Tosylate Take 2 tablets (300 mg total) by mouth daily.   Odefsey 200-25-25 MG Tabs tablet Generic drug: emtricitabine-rilpivir-tenofovir AF Take 1 tablet by mouth daily.   olmesartan 40 MG tablet Commonly known as: BENICAR TAKE 1 TABLET(40 MG) BY MOUTH DAILY   ondansetron 8 MG disintegrating tablet Commonly known as: ZOFRAN-ODT Take 1 tablet (8 mg total) by mouth every 8 (eight) hours as needed for nausea or vomiting.   Ozempic (1 MG/DOSE) 4 MG/3ML Sopn Generic drug:  Semaglutide (1 MG/DOSE) Inject 1 mg weekly   pioglitazone 30 MG tablet Commonly known as: ACTOS TAKE 1 TABLET(30 MG) BY MOUTH DAILY   torsemide 20 MG tablet Commonly known as: DEMADEX TAKE 1 TABLET BY MOUTH EVERY MONDAY, WEDNESDAY, AND FRIDAY   Toujeo SoloStar 300 UNIT/ML Solostar Pen Generic drug: insulin glargine (1 Unit Dial) ADMINISTER 44 UNITS UNDER THE SKIN DAILY   traZODone 150 MG tablet Commonly known as: DESYREL TAKE 3 TABLETS(450 MG) BY MOUTH AT BEDTIME        Allergies:  Allergies  Allergen Reactions   Benazepril Cough   Molds & Smuts     Hard to breathe   Pravastatin Other (See Comments)    myositis   Glipizide Nausea Only    Past Medical History:  Diagnosis Date   Anxiety    Cataract    right    Depression    Diabetes mellitus without complication (HCC)    DM (diabetes mellitus screen)    HIV disease (HCC) 09/12/1995   HIV infection (HCC)    Hypercholesterolemia    Hyperlipidemia    Hypertension     Past Surgical History:  Procedure Laterality Date   COLONOSCOPY WITH PROPOFOL  01/16/2012   Procedure: COLONOSCOPY WITH PROPOFOL;  Surgeon: Charolett Bumpers, MD;  Location: [facility];  Service: Endoscopy;  Laterality: N/A;   EYE SURGERY  2005   catarct surgery   optic lens surgery  2005    Family History  Problem Relation Age of Onset   Arthritis Mother    Diabetes Mother  Heart disease Mother    Stroke Mother    Arthritis Father    Diabetes Father    Heart disease Father     Social History:  reports that he has been smoking cigarettes. He has been smoking an average of .25 packs per day. He has never used smokeless tobacco. He reports that he does not drink alcohol and does not use drugs.  Review of Systems:  Hypertension:  on Bystolic 20 mg and on Benicar 40 mg, prescribed by his PCP   BP Readings from Last 3 Encounters:  05/05/22 118/72  04/18/22 128/64  03/09/22 120/68   RENAL function is recently better Normal results of  microalbumin as of 4/23   Lab Results  Component Value Date   CREATININE 1.19 05/02/2022   CREATININE 1.06 01/24/2022   CREATININE 1.34 10/24/2021    Lipids: LDL is at target as follows Treatment prescribed by PCP, he is on Livalo 1 mg   Lab Results  Component Value Date   CHOL 165 01/24/2022   HDL 43 01/24/2022   LDLCALC 95 01/24/2022   TRIG 170 (H) 01/24/2022   CHOLHDL 3.8 01/24/2022    Neuropathy: Has had paresthesiae in feet and legs, these are treated with gabapentin 300 mg 3 times a day  Forms for the diabetic shoes were sent in July 2021 to the podiatrist. Has sensory loss on exam  Recently recovering from osteomyelitis treated with antibiotics   Examination:   BP 118/72   Pulse 78   Wt 197 lb (89.4 kg)   SpO2 96%   BMI 25.99 kg/m   Body mass index is 25.99 kg/m.      ASSESSMENT/ PLAN: April   Diabetes type 2 on insulin:   See history of present illness for detailed discussion of his current management, blood sugar patterns and problems identified  His A1c is 8  He is on basal bolus insulin, Farxiga, Ozempic 1 mg weekly and Actos 30 mg  His blood sugars are generally better with going back on Ozempic Also because of decreased appetite he has taken less insulin overall Although his overnight and Premeal blood sugars are moderately increased glucose appears to be improving in the last 2 to 3 days He is doing well with using the libre 3 sensor and CGM active time is better at 94%  Recommendations:   He will continue the Ozempic unchanged Reassured him that this is not causing any side effects Discussed potentially increasing the Toujeo 5 units every week or so if your morning sugars are staying over 160 He will likely need to take the Humalog consistently with every meal especially if he is eating a sandwich at lunch If blood sugars are over 200 after meals he will need to increase the dose back up to 25 or 30 units Also continue [place]  Renal  function okay   There are no Patient Instructions on file for this visit.   Time for evaluation and management and total visit time including counseling = 30 minutes    Reather Littler 05/05/2022, 10:54 AM           ..

## 2022-05-10 ENCOUNTER — Other Ambulatory Visit: Payer: Self-pay | Admitting: Internal Medicine

## 2022-05-10 NOTE — Progress Notes (Signed)
Patient is here today to learn how to better take care if himself.  Says he does not want to be told what to do, he would rather learn why and then decide himself.  We discussed how someone without diabetes delivers insulin and what he is doing, how the insulin he takes works to mimic this, and what he can do to adjust this dose. HE does not think he needs "all of this insulin, so he decided to try the CGM Libre 3.  This was attached to his left arm and readings are going to his phone and [facility].  Goals of blood sugars given:ac: less than 110,and2hr. Pc: less than 180.   Diet:  is veriable according to patient.  Meals are not eaten the same time every day, nor the same quantities every meal.  Discussed this and what will be needed to see if the insulin he takes is covering the meals he is eating. Discussed basic meal planning;  making sure all meals have protein carbs and small amount of fats.  Also no sweet drinks, fruit juices and cold cereal and milk.  Discussed what was in each group, and approximatley what he should be eating--and then what to do if eating more/less with insulin doses.  He reported good understanding of this and agreed to come back in 2 weeks review diet/blood sugar readings and insulin dose adjustments for what he is eating.

## 2022-05-10 NOTE — Patient Instructions (Signed)
Drink no sweet drinks, fruit juices and eat no cold cereal and milk. Make sure all meals have protein, carbohydrate and small amounts of fat. Return in 2 weeks to review blood sugar readings.

## 2022-05-12 ENCOUNTER — Encounter: Payer: Self-pay | Admitting: Endocrinology

## 2022-05-15 ENCOUNTER — Ambulatory Visit: Payer: 59 | Admitting: Podiatry

## 2022-05-18 ENCOUNTER — Ambulatory Visit (INDEPENDENT_AMBULATORY_CARE_PROVIDER_SITE_OTHER): Payer: 59 | Admitting: Podiatry

## 2022-05-18 DIAGNOSIS — M79674 Pain in right toe(s): Secondary | ICD-10-CM | POA: Diagnosis not present

## 2022-05-18 DIAGNOSIS — L97511 Non-pressure chronic ulcer of other part of right foot limited to breakdown of skin: Secondary | ICD-10-CM

## 2022-05-18 DIAGNOSIS — B351 Tinea unguium: Secondary | ICD-10-CM | POA: Diagnosis not present

## 2022-05-18 DIAGNOSIS — M79675 Pain in left toe(s): Secondary | ICD-10-CM | POA: Diagnosis not present

## 2022-05-18 NOTE — Progress Notes (Signed)
Subjective:  Patient ID: Nicholas Whitehead, male    DOB: 11/04/1955,  MRN: [id number]  Chief Complaint  Patient presents with   Diabetic Ulcer    2 week follow right toe    67 y.o. male presents with the above complaint. History confirmed with patient.  Doing well. Nails elongated and causing discomfort. Still using ointment on R great toe  Objective:  Physical Exam: Right foot is warm and well-perfused, he has palpable pulses, diffuse neuropathy, No cellulitis no purulence no drainage.  Ulceration fully healed with small scab.  Hyperkeratosis surrounding. Elongated thickened mycotic painful nails x9     Study Result  Narrative & Impression  CLINICAL DATA:  Pain   EXAM: RIGHT GREAT TOE   COMPARISON:  Radiographs of right foot done on 05/25/2020 in outside institution   FINDINGS: No recent fracture or dislocation is seen. There is interval fusion of interphalangeal joint. Degenerative changes were noted in the interphalangeal joint in the previous study. There is small defect in the medial cortical margin of tip of distal phalanx. Bony spurs are noted in first metatarsophalangeal joint. There are no opaque foreign bodies.   IMPRESSION: There is small cortical defect in the medial aspect of tip of distal phalanx which may be residual from previous injury or suggest osteomyelitis. If clinically warranted, follow-up MRI may be considered.   No recent fracture or dislocation is seen. There is fusion of interphalangeal joint.     Electronically Signed   By: Ernie Avena M.D.   On: 03/09/2022 12:07    Narrative & Impression  CLINICAL DATA:  Right great toe ulcer, concern for osteomyelitis   EXAM: MRI OF THE RIGHT TOES WITHOUT AND WITH CONTRAST   TECHNIQUE: Multiplanar, multisequence MR imaging of the right forefoot was performed both before and after administration of intravenous contrast.   CONTRAST:  9 mL of Vueway   COMPARISON:  Radiographs dated  March 09, 2022   FINDINGS: Bones/Joint/Cartilage   Bone marrow edema of the distal phalanx of the first digit with osseous erosion consistent with osteomyelitis. There is advanced arthritis/ankylosis at the interphalangeal joint of the first digit. Marrow signal within remaining osseous structures is within normal limits   Ligaments   Lisfranc and collateral ligaments are intact   Muscles and Tendons   Intrinsic muscles of the foot are normal in caliber. Flexor and extensor tendons appear intact.   Soft tissues   Skin irregularity about the medial aspect of the the distal phalanx of the first digit. No fluid collection or abscess.   IMPRESSION: 1. Osteomyelitis of the distal phalanx of the first digit. 2. Advanced arthritis/ankylosis at the interphalangeal joint of the first digit. 3. Skin irregularity about the medial aspect of the distal phalanx of the first digit. No fluid collection or abscess.     Electronically Signed   By: Larose Hires D.O.   On: 03/12/2022 14:29   New radiographs taken today show no significant increase in osteolysis or erosions.   Assessment:   1. Ulcer of toe of right foot, limited to breakdown of skin (HCC)   2. Pain due to onychomycosis of toenails of both feet      Plan:  Patient was evaluated and treated and all questions answered.  Ulcer essentially healed, small scab. May leave OTA and apply moisturizing lotion at this point. Return 5 weeks for new surveillance radiographs. If negative for any new changes can go to regular quarterly visits.  Discussed the etiology and treatment options for  the condition in detail with the patient. Prior nail debridements have been helpful. Recommended debridement of the nails today. Sharp and mechanical debridement performed of all painful and mycotic nails today. Nails debrided in length and thickness using a nail nipper to level of comfort. Discussed treatment options including appropriate shoe  gear. Follow up as needed for painful nails.   Return in about 1 month (around 06/18/2022) for follow up xrays.

## 2022-06-13 ENCOUNTER — Ambulatory Visit: Payer: 59 | Admitting: Podiatry

## 2022-06-23 ENCOUNTER — Ambulatory Visit (INDEPENDENT_AMBULATORY_CARE_PROVIDER_SITE_OTHER): Payer: 59

## 2022-06-23 ENCOUNTER — Ambulatory Visit (INDEPENDENT_AMBULATORY_CARE_PROVIDER_SITE_OTHER): Payer: 59 | Admitting: Podiatry

## 2022-06-23 ENCOUNTER — Other Ambulatory Visit: Payer: Self-pay

## 2022-06-23 ENCOUNTER — Other Ambulatory Visit: Payer: Self-pay | Admitting: Internal Medicine

## 2022-06-23 DIAGNOSIS — Z794 Long term (current) use of insulin: Secondary | ICD-10-CM

## 2022-06-23 DIAGNOSIS — E1140 Type 2 diabetes mellitus with diabetic neuropathy, unspecified: Secondary | ICD-10-CM

## 2022-06-23 DIAGNOSIS — F330 Major depressive disorder, recurrent, mild: Secondary | ICD-10-CM

## 2022-06-23 DIAGNOSIS — F5105 Insomnia due to other mental disorder: Secondary | ICD-10-CM

## 2022-06-23 DIAGNOSIS — E1165 Type 2 diabetes mellitus with hyperglycemia: Secondary | ICD-10-CM

## 2022-06-23 DIAGNOSIS — F409 Phobic anxiety disorder, unspecified: Secondary | ICD-10-CM

## 2022-06-23 DIAGNOSIS — F32A Depression, unspecified: Secondary | ICD-10-CM

## 2022-06-23 DIAGNOSIS — F419 Anxiety disorder, unspecified: Secondary | ICD-10-CM

## 2022-06-23 DIAGNOSIS — L97511 Non-pressure chronic ulcer of other part of right foot limited to breakdown of skin: Secondary | ICD-10-CM | POA: Diagnosis not present

## 2022-06-23 DIAGNOSIS — E785 Hyperlipidemia, unspecified: Secondary | ICD-10-CM

## 2022-06-23 DIAGNOSIS — I10 Essential (primary) hypertension: Secondary | ICD-10-CM

## 2022-06-23 DIAGNOSIS — E119 Type 2 diabetes mellitus without complications: Secondary | ICD-10-CM

## 2022-06-23 DIAGNOSIS — J410 Simple chronic bronchitis: Secondary | ICD-10-CM

## 2022-06-23 MED ORDER — PITAVASTATIN CALCIUM 1 MG PO TABS: ORAL_TABLET | ORAL | 0 refills | Status: DC

## 2022-06-23 MED ORDER — GABAPENTIN 300 MG PO CAPS: 300.0000 mg | ORAL_CAPSULE | Freq: Three times a day (TID) | ORAL | 0 refills | Status: DC

## 2022-06-23 MED ORDER — OZEMPIC (1 MG/DOSE) 4 MG/3ML ~~LOC~~ SOPN: PEN_INJECTOR | SUBCUTANEOUS | 5 refills | Status: DC

## 2022-06-23 MED ORDER — TOUJEO SOLOSTAR 300 UNIT/ML ~~LOC~~ SOPN: PEN_INJECTOR | SUBCUTANEOUS | 1 refills | Status: DC

## 2022-06-23 MED ORDER — MIRTAZAPINE 7.5 MG PO TABS: ORAL_TABLET | ORAL | 0 refills | Status: DC

## 2022-06-23 MED ORDER — DAPAGLIFLOZIN PROPANEDIOL 10 MG PO TABS: 10.0000 mg | ORAL_TABLET | Freq: Every day | ORAL | 0 refills | Status: DC

## 2022-06-23 MED ORDER — ALBUTEROL SULFATE HFA 108 (90 BASE) MCG/ACT IN AERS: 2.0000 | INHALATION_SPRAY | Freq: Four times a day (QID) | RESPIRATORY_TRACT | 3 refills | Status: DC | PRN

## 2022-06-23 MED ORDER — PIOGLITAZONE HCL 30 MG PO TABS: ORAL_TABLET | ORAL | 1 refills | Status: DC

## 2022-06-23 MED ORDER — FREESTYLE LIBRE 3 SENSOR MISC: 1.0000 | Freq: Every day | 5 refills | Status: DC

## 2022-06-23 MED ORDER — TORSEMIDE 20 MG PO TABS: ORAL_TABLET | ORAL | 0 refills | Status: DC

## 2022-06-23 MED ORDER — DULOXETINE HCL 60 MG PO CPEP: ORAL_CAPSULE | ORAL | 0 refills | Status: DC

## 2022-06-23 MED ORDER — BELSOMRA 20 MG PO TABS: 1.0000 | ORAL_TABLET | Freq: Every evening | ORAL | 0 refills | Status: DC | PRN

## 2022-06-23 MED ORDER — TRAZODONE HCL 150 MG PO TABS: 450.0000 mg | ORAL_TABLET | Freq: Every day | ORAL | 0 refills | Status: DC

## 2022-06-23 MED ORDER — HUMALOG KWIKPEN 100 UNIT/ML ~~LOC~~ SOPN: PEN_INJECTOR | SUBCUTANEOUS | 1 refills | Status: DC

## 2022-06-23 NOTE — Progress Notes (Signed)
  Subjective:  Patient ID: Nicholas Whitehead, male    DOB: [birth year],  MRN: [id number]  Chief Complaint  Patient presents with   Foot Ulcer    67 y.o. male presents with the above complaint. History confirmed with patient.  Doing well. Nails elongated and causing discomfort. Still using ointment on R great toe  Objective:  Physical Exam: Right foot is warm and well-perfused, he has palpable pulses, diffuse neuropathy, No cellulitis no purulence no drainage.  Ulceration fully healed with small scab.  Hyperkeratosis surrounding. Elongated thickened mycotic painful nails x9     Study Result  Narrative & Impression  CLINICAL DATA:  Pain   EXAM: RIGHT GREAT TOE   COMPARISON:  Radiographs of right foot done on 05/25/2020 in outside institution   FINDINGS: No recent fracture or dislocation is seen. There is interval fusion of interphalangeal joint. Degenerative changes were noted in the interphalangeal joint in the previous study. There is small defect in the medial cortical margin of tip of distal phalanx. Bony spurs are noted in first metatarsophalangeal joint. There are no opaque foreign bodies.   IMPRESSION: There is small cortical defect in the medial aspect of tip of distal phalanx which may be residual from previous injury or suggest osteomyelitis. If clinically warranted, follow-up MRI may be considered.   No recent fracture or dislocation is seen. There is fusion of interphalangeal joint.     Electronically Signed   By: Ernie Avena M.D.   On: 03/09/2022 12:07    Narrative & Impression  CLINICAL DATA:  Right great toe ulcer, concern for osteomyelitis   EXAM: MRI OF THE RIGHT TOES WITHOUT AND WITH CONTRAST   TECHNIQUE: Multiplanar, multisequence MR imaging of the right forefoot was performed both before and after administration of intravenous contrast.   CONTRAST:  9 mL of Vueway   COMPARISON:  Radiographs dated March 09, 2022    FINDINGS: Bones/Joint/Cartilage   Bone marrow edema of the distal phalanx of the first digit with osseous erosion consistent with osteomyelitis. There is advanced arthritis/ankylosis at the interphalangeal joint of the first digit. Marrow signal within remaining osseous structures is within normal limits   Ligaments   Lisfranc and collateral ligaments are intact   Muscles and Tendons   Intrinsic muscles of the foot are normal in caliber. Flexor and extensor tendons appear intact.   Soft tissues   Skin irregularity about the medial aspect of the the distal phalanx of the first digit. No fluid collection or abscess.   IMPRESSION: 1. Osteomyelitis of the distal phalanx of the first digit which is stable 2. Advanced arthritis/ankylosis at the interphalangeal joint of the first digit. 3. Skin irregularity about the medial aspect of the distal phalanx of the first digit. No fluid collection or abscess.     Electronically Signed   By: Larose Hires D.O.   On: 03/12/2022 14:29   New radiographs taken today show no significant increase in osteolysis or erosions.   Assessment:   1. Ulcer of toe of right foot, limited to breakdown of skin [facility])      Plan:  Patient was evaluated and treated and all questions answered.  Ulcer has completely healed.  No further scabbing.  At this time patient is radiographs are also stable.   osteomyelitis appears to be more chronic in nature. Patient will return to Dr. Lewis Moccasin if that also reoccurs.  He states understanding.  He will see Dr. Clois Comber for routine footcare   No follow-ups on file.

## 2022-06-23 NOTE — Telephone Encounter (Signed)
A representative from [facility] called to check and see if their fax was received for patient's medication refills. They have sent multiple faxes, with the most recent being 06/20/2022. They are also sending another fax request today 06/23/2022. Best callback is [phone].

## 2022-06-23 NOTE — Telephone Encounter (Signed)
Paper refill request received and I have pended the 9 Rx's for your review.  Please advise.

## 2022-06-27 ENCOUNTER — Telehealth: Payer: Self-pay | Admitting: Internal Medicine

## 2022-06-27 ENCOUNTER — Other Ambulatory Visit: Payer: Self-pay | Admitting: Internal Medicine

## 2022-06-27 DIAGNOSIS — E11628 Type 2 diabetes mellitus with other skin complications: Secondary | ICD-10-CM

## 2022-06-27 DIAGNOSIS — E1140 Type 2 diabetes mellitus with diabetic neuropathy, unspecified: Secondary | ICD-10-CM

## 2022-06-27 DIAGNOSIS — I10 Essential (primary) hypertension: Secondary | ICD-10-CM

## 2022-06-27 DIAGNOSIS — M86171 Other acute osteomyelitis, right ankle and foot: Secondary | ICD-10-CM

## 2022-06-27 MED ORDER — OLMESARTAN MEDOXOMIL 40 MG PO TABS: ORAL_TABLET | ORAL | 0 refills | Status: DC

## 2022-06-27 NOTE — Telephone Encounter (Signed)
Prescription Request  06/27/2022  LOV: 04/18/2022  What is the name of the medication or equipment? olmesartan (BENICAR) 40 MG tablet   ondansetron (ZOFRAN-ODT) 8 MG disintegrating tablet  Have you contacted your pharmacy to request a refill? No  No Which pharmacy would you like this sent to?     [facility] [place] [address] [place] [address] Phone: [phone] Fax: [phone]  Patient notified that their request is being sent to the clinical staff for review and that they should receive a response within 2 business days.   Please advise at Mobile [phone] (mobile)

## 2022-07-21 ENCOUNTER — Other Ambulatory Visit: Payer: Self-pay

## 2022-07-21 DIAGNOSIS — B20 Human immunodeficiency virus [HIV] disease: Secondary | ICD-10-CM

## 2022-07-21 MED ORDER — ODEFSEY 200-25-25 MG PO TABS: 1.0000 | ORAL_TABLET | Freq: Every day | ORAL | 0 refills | Status: DC

## 2022-07-21 NOTE — Progress Notes (Signed)
Received fax from [facility] stating patient consent to have medications sent there. Refill of Odefsey sent to [facility] (P: [phone] F: [phone])  Sandie Ano, RN

## 2022-07-25 ENCOUNTER — Other Ambulatory Visit: Payer: Self-pay

## 2022-07-25 ENCOUNTER — Other Ambulatory Visit: Payer: 59

## 2022-07-25 DIAGNOSIS — B20 Human immunodeficiency virus [HIV] disease: Secondary | ICD-10-CM

## 2022-07-26 LAB — T-HELPER CELL (CD4) - (RCID CLINIC ONLY)
CD4 % Helper T Cell: 35 % (ref 33–65)
CD4 T Cell Abs: 727 /uL (ref 400–1790)

## 2022-07-27 ENCOUNTER — Encounter: Payer: Self-pay | Admitting: Internal Medicine

## 2022-07-27 ENCOUNTER — Ambulatory Visit (INDEPENDENT_AMBULATORY_CARE_PROVIDER_SITE_OTHER): Payer: 59 | Admitting: Internal Medicine

## 2022-07-27 VITALS — BP 128/78 | HR 67 | Temp 98.5°F | Resp 16 | Ht 73.0 in | Wt 190.0 lb

## 2022-07-27 DIAGNOSIS — Z794 Long term (current) use of insulin: Secondary | ICD-10-CM | POA: Diagnosis not present

## 2022-07-27 DIAGNOSIS — I10 Essential (primary) hypertension: Secondary | ICD-10-CM

## 2022-07-27 DIAGNOSIS — N1831 Chronic kidney disease, stage 3a: Secondary | ICD-10-CM

## 2022-07-27 DIAGNOSIS — E119 Type 2 diabetes mellitus without complications: Secondary | ICD-10-CM

## 2022-07-27 DIAGNOSIS — E785 Hyperlipidemia, unspecified: Secondary | ICD-10-CM | POA: Diagnosis not present

## 2022-07-27 LAB — URINALYSIS, ROUTINE W REFLEX MICROSCOPIC
Bilirubin Urine: NEGATIVE
Hgb urine dipstick: NEGATIVE
Ketones, ur: NEGATIVE
Leukocytes,Ua: NEGATIVE
Nitrite: NEGATIVE
Specific Gravity, Urine: <=1.005 — AB (ref 1.000–1.030)
Total Protein, Urine: NEGATIVE
Urine Glucose: >=1000 — AB
Urobilinogen, UA: 0.2 (ref 0.0–1.0)
pH: 7 (ref 5.0–8.0)

## 2022-07-27 LAB — HIV-1 RNA QUANT-NO REFLEX-BLD
HIV 1 RNA Quant: NOT DETECTED Copies/mL
HIV-1 RNA Quant, Log: NOT DETECTED Log cps/mL

## 2022-07-27 LAB — MICROALBUMIN / CREATININE URINE RATIO
Creatinine,U: 88.7 mg/dL
Microalb Creat Ratio: 0.8 mg/g (ref 0.0–30.0)
Microalb, Ur: <0.7 mg/dL (ref 0.0–1.9)

## 2022-07-27 NOTE — Progress Notes (Unsigned)
Subjective:  Patient ID: Nicholas Whitehead, male    DOB: 01-08-1956  Age: 67 y.o. MRN: [id number]  CC: Diabetes   HPI Nicholas Whitehead presents for f/up ---  Discussed the use of AI scribe software for clinical note transcription with the patient, who gave verbal consent to proceed.  History of Present Illness   The patient, with a history of neuropathy and balance issues, presents with concerns about their disability status. They express a desire to increase their disability benefits due to worsening health conditions. They have previously submitted disability forms, but the status of these forms is unclear.  The patient reports a reduction in smoking, using patches to aid in this process, and has abstained from alcohol for several months. They continue to experience balance issues, necessitating the use of a cane. Neuropathy in the feet persists, causing tingling and pain.   The patient has been offered video consultations with a psychiatrist or therapist through their health plan, but they express reservations due to a previous experience with phone scams. They report having to replace their phone due to virus infections.       Outpatient Medications Prior to Visit  Medication Sig Dispense Refill   albuterol (VENTOLIN HFA) 108 (90 Base) MCG/ACT inhaler Inhale 2 puffs into the lungs every 6 (six) hours as needed for wheezing or shortness of breath. 6.7 g 3   Alcohol Swabs (B-D SINGLE USE SWABS REGULAR) PADS USE AS DIRECTED 300 each 1   aspirin EC 81 MG tablet Take 81 mg by mouth every morning.     cetirizine (ZYRTEC) 10 MG tablet Take 10 mg by mouth daily.     Cholecalciferol 2000 units TABS Take 1 tablet (2,000 Units total) by mouth daily. 90 tablet 1   Continuous Blood Gluc Receiver (FREESTYLE LIBRE 3 READER) DEVI 1 Act by Does not apply route daily. 2 each 5   Continuous Glucose Sensor (FREESTYLE LIBRE 3 SENSOR) MISC 1 Act by Does not apply route daily. Place 1 sensor on the skin every 14  days. Use to check glucose continuously 2 each 5   dapagliflozin propanediol (FARXIGA) 10 MG TABS tablet Take 1 tablet (10 mg total) by mouth daily before breakfast. 90 tablet 0   DULoxetine (CYMBALTA) 60 MG capsule TAKE 1 CAPSULE(60 MG) BY MOUTH DAILY 90 capsule 0   emtricitabine-rilpivir-tenofovir AF (ODEFSEY) 200-25-25 MG TABS tablet Take 1 tablet by mouth daily. 90 tablet 0   gabapentin (NEURONTIN) 300 MG capsule Take 1 capsule (300 mg total) by mouth 3 (three) times daily. 270 capsule 0   Glucagon (GVOKE HYPOPEN 2-PACK) 1 MG/0.2ML SOAJ Inject 1 Act into the skin daily as needed. 2 mL 6   Glycopyrrolate-Formoterol (BEVESPI AEROSPHERE) 9-4.8 MCG/ACT AERO Inhale 2 puffs into the lungs 2 (two) times daily. 32.1 g 1   HUMALOG KWIKPEN 100 UNIT/ML KwikPen ADMINISTER 25 TO 30 UNITS UNDER THE SKIN THREE TIMES DAILY 90 mL 1   Insulin Pen Needle (B-D ULTRAFINE III SHORT PEN) 31G X 8 MM MISC USE TO INJECT INSULN AS DIRECTED THREE TIMES DAILY PER HUMALOG PRESCRIPTION 500 each 1   mirtazapine (REMERON) 7.5 MG tablet TAKE 1 TABLET(7.5 MG) BY MOUTH AT BEDTIME 90 tablet 0   mupirocin ointment (BACTROBAN) 2 % Apply 1 Application topically 2 (two) times daily. 30 g 2   Nebivolol HCl 20 MG TABS TAKE 1 TABLET(20 MG) BY MOUTH DAILY 90 tablet 1   olmesartan (BENICAR) 40 MG tablet TAKE 1 TABLET(40 MG) BY MOUTH DAILY 90  tablet 0   pioglitazone (ACTOS) 30 MG tablet TAKE 1 TABLET(30 MG) BY MOUTH DAILY 90 tablet 1   Pitavastatin Calcium (LIVALO) 1 MG TABS TAKE 1 TABLET(1 MG) BY MOUTH DAILY 90 tablet 0   Semaglutide, 1 MG/DOSE, (OZEMPIC, 1 MG/DOSE,) 4 MG/3ML SOPN Inject 1 mg weekly 3 mL 5   Suvorexant (BELSOMRA) 20 MG TABS Take 1 tablet (20 mg total) by mouth at bedtime as needed. 90 tablet 0   torsemide (DEMADEX) 20 MG tablet TAKE 1 TABLET BY MOUTH EVERY MONDAY, WEDNESDAY, AND FRIDAY 36 tablet 0   TOUJEO SOLOSTAR 300 UNIT/ML Solostar Pen ADMINISTER 44 UNITS UNDER THE SKIN DAILY 6 mL 1   traZODone (DESYREL) 150 MG tablet  Take 3 tablets (450 mg total) by mouth at bedtime. 270 tablet 0   ACCU-CHEK GUIDE test strip 1 each by Other route 4 (four) times daily. TEST BLOOD SUGAR FOUR TIMES DAILY 400 strip 1   Lancets MISC Use to test blood sugar tid. DX: E11.8 300 each 3   Omadacycline Tosylate (NUZYRA) 150 MG TABS Take 2 tablets (300 mg total) by mouth daily. 12 tablet 0   Glucagon, rDNA, (GLUCAGON EMERGENCY) 1 MG KIT Inject 1 mg into the skin once for 1 dose. 1 kit 5   No facility-administered medications prior to visit.    ROS Review of Systems  Constitutional: Negative.  Negative for chills, diaphoresis, fatigue, fever and unexpected weight change.  HENT: Negative.    Eyes: Negative.   Respiratory:  Positive for shortness of breath. Negative for cough, chest tightness and wheezing.   Cardiovascular:  Negative for chest pain, palpitations and leg swelling.  Gastrointestinal:  Negative for abdominal pain, diarrhea, nausea and vomiting.  Endocrine: Negative.   Genitourinary: Negative.  Negative for difficulty urinating and dysuria.  Musculoskeletal: Negative.  Negative for arthralgias and myalgias.  Skin: Negative.   Neurological:  Negative for dizziness and weakness.  Hematological:  Negative for adenopathy. Does not bruise/bleed easily.  Psychiatric/Behavioral: Negative.      Objective:  BP 128/78 (BP Location: Left Arm, Patient Position: Sitting, Cuff Size: Normal)   Pulse 67   Temp 98.5 F (36.9 C) (Oral)   Resp 16   Ht 6\' 1"  (1.854 m)   Wt 190 lb (86.2 kg)   SpO2 95%   BMI 25.07 kg/m   BP Readings from Last 3 Encounters:  07/27/22 128/78  05/05/22 118/72  04/18/22 128/64    Wt Readings from Last 3 Encounters:  07/27/22 190 lb (86.2 kg)  05/05/22 197 lb (89.4 kg)  04/18/22 194 lb (88 kg)    Physical Exam Vitals reviewed.  HENT:     Nose: Nose normal.     Mouth/Throat:     Mouth: Mucous membranes are moist.  Eyes:     General: No scleral icterus.    Conjunctiva/sclera:  Conjunctivae normal.  Cardiovascular:     Rate and Rhythm: Normal rate and regular rhythm.     Heart sounds: No murmur heard. Pulmonary:     Effort: Pulmonary effort is normal.     Breath sounds: No stridor. No wheezing, rhonchi or rales.  Abdominal:     General: Abdomen is flat.     Palpations: There is no mass.     Tenderness: There is no abdominal tenderness. There is no guarding or rebound.     Hernia: No hernia is present.  Musculoskeletal:        General: Normal range of motion.     Cervical back:  Neck supple.     Right lower leg: No edema.     Left lower leg: No edema.  Lymphadenopathy:     Cervical: No cervical adenopathy.  Skin:    General: Skin is warm and dry.     Findings: No rash.  Neurological:     General: No focal deficit present.     Mental Status: He is alert. Mental status is at baseline.  Psychiatric:        Mood and Affect: Mood normal.        Behavior: Behavior normal.     Lab Results  Component Value Date   WBC 9.0 12/08/2021   HGB 15.8 12/08/2021   HCT 47.0 12/08/2021   PLT 192.0 12/08/2021   GLUCOSE 195 (H) 05/02/2022   CHOL 165 01/24/2022   TRIG 170 (H) 01/24/2022   HDL 43 01/24/2022   LDLCALC 95 01/24/2022   ALT 22 01/24/2022   AST 22 01/24/2022   NA 137 05/02/2022   K 4.4 05/02/2022   CL 100 05/02/2022   CREATININE 1.19 05/02/2022   BUN 17 05/02/2022   CO2 29 05/02/2022   TSH 1.05 12/08/2021   PSA 1.72 12/08/2021   HGBA1C 8.0 (H) 05/02/2022   MICROALBUR <0.7 07/27/2022    VAS [name] ABI WITH/WO TBI  Result Date: 03/15/2022  LOWER EXTREMITY DOPPLER STUDY Patient Name:  ASIER DESROCHES  Date of Exam:   03/15/2022 Medical Rec #: [id number]      Accession #:    [id number] Date of Birth: 13-Nov-1955     Patient Gender: M Patient Age:   84 years Exam Location:  [facility] Procedure:      VAS [name] ABI WITH/WO TBI Referring Phys: Sanda Linger --------------------------------------------------------------------------------   Indications: Peripheral artery disease. Pain of right great toe High Risk Factors: Hypertension, hyperlipidemia, Diabetes.  Comparison Study: 10/16/19 RT ABI 1.24 LT ABI 1.15 Performing Technologist: [place] Ponder RVS  Examination Guidelines: A complete evaluation includes at minimum, Doppler waveform signals and systolic blood pressure reading at the level of bilateral brachial, anterior tibial, and posterior tibial arteries, when vessel segments are accessible. Bilateral testing is considered an integral part of a complete examination. Photoelectric Plethysmograph (PPG) waveforms and toe systolic pressure readings are included as required and additional duplex testing as needed. Limited examinations for reoccurring indications may be performed as noted.  ABI Findings: +---------+------------------+-----+---------+--------+ Right    Rt Pressure (mmHg)IndexWaveform Comment  +---------+------------------+-----+---------+--------+ Brachial 125                                      +---------+------------------+-----+---------+--------+ PTA      133               1.06 triphasic         +---------+------------------+-----+---------+--------+ DP       146               1.17 triphasic         +---------+------------------+-----+---------+--------+ Great Toe114               0.91 Normal            +---------+------------------+-----+---------+--------+ +---------+------------------+-----+---------+-------+ Left     Lt Pressure (mmHg)IndexWaveform Comment +---------+------------------+-----+---------+-------+ Brachial 118                                     +---------+------------------+-----+---------+-------+  PTA      150               1.20 triphasic        +---------+------------------+-----+---------+-------+ DP       131               1.05 triphasic        +---------+------------------+-----+---------+-------+ Great Toe91                0.73 Normal            +---------+------------------+-----+---------+-------+ +-------+-----------+-----------+------------+------------+ ABI/TBIToday's ABIToday's TBIPrevious ABIPrevious TBI +-------+-----------+-----------+------------+------------+ Right  1.17       1.20                                +-------+-----------+-----------+------------+------------+ Left   0.91       0.73                                +-------+-----------+-----------+------------+------------+  Summary: Right: Resting right ankle-brachial index is within normal range. The right toe-brachial index is normal. Left: Resting left ankle-brachial index is within normal range. The left toe-brachial index is normal. *See table(s) above for measurements and observations.  Electronically signed by Lemar Livings MD on 03/15/2022 at 2:04:00 PM.    Final     Assessment & Plan:  Stage 3a chronic kidney disease (HCC) -     Urinalysis, Routine w reflex microscopic; Future -     Microalbumin / creatinine urine ratio; Future  Primary hypertension -     Urinalysis, Routine w reflex microscopic; Future  Insulin-requiring or dependent type II diabetes mellitus (HCC) -     Urinalysis, Routine w reflex microscopic; Future -     Microalbumin / creatinine urine ratio; Future -     Accu-Chek Guide; 1 each by Other route 4 (four) times daily. TEST BLOOD SUGAR FOUR TIMES DAILY  Dispense: 400 strip; Refill: 1 -     Lancets; Inject 1 Act into the skin 4 (four) times daily. Use to test blood sugar tid. DX: E11.8  Dispense: 300 each; Refill: 3  Hyperlipidemia LDL goal <100 -     Lipoprotein A (LPA); Future     Follow-up: Return in about 6 months (around 01/27/2023).  Sanda Linger, MD

## 2022-07-27 NOTE — Patient Instructions (Signed)

## 2022-07-28 ENCOUNTER — Telehealth: Payer: Self-pay | Admitting: Internal Medicine

## 2022-07-28 DIAGNOSIS — E119 Type 2 diabetes mellitus without complications: Secondary | ICD-10-CM

## 2022-07-28 NOTE — Telephone Encounter (Signed)
Prescription Request  07/28/2022  LOV: 07/27/2022  What is the name of the medication or equipment?  ACCU-CHEK GUIDE test strip   Have you contacted your pharmacy to request a refill? No   Which pharmacy would you like this sent to?   Patient notified that their request i [id number] [place], [place] - [phone] AT [facility] OF [facility] & [address] [place] [address] Phone: [phone] Fax: [phone]  being sent to the clinical staff for review and that they should receive a response within 2 business days.   Please advise at Mobile [phone] (mobile)

## 2022-07-29 MED ORDER — ACCU-CHEK GUIDE VI STRP
1.0000 | ORAL_STRIP | Freq: Four times a day (QID) | 1 refills | Status: DC
Start: 2022-07-29 — End: 2023-05-29

## 2022-07-29 MED ORDER — LANCETS MISC
1.0000 | Freq: Four times a day (QID) | 3 refills | Status: AC
Start: 2022-07-29 — End: ?

## 2022-07-31 MED ORDER — FREESTYLE LIBRE 3 SENSOR MISC
1.0000 | Freq: Every day | 5 refills | Status: DC
Start: 2022-07-31 — End: 2022-08-01

## 2022-07-31 NOTE — Telephone Encounter (Signed)
Patient needs test strips sent in for [facility] 3 sensor instead. Best callback is [phone].

## 2022-08-01 ENCOUNTER — Telehealth: Payer: Self-pay | Admitting: Internal Medicine

## 2022-08-01 MED ORDER — ACCU-CHEK GUIDE W/DEVICE KIT: PACK | 0 refills | Status: AC

## 2022-08-01 NOTE — Telephone Encounter (Signed)
Mariam from [facility] called and wanted to make sure patient's PCP was aware of the interaction of mirtazapine (REMERON) 7.5 MG tablet and traZODone (DESYREL) 150 MG tablet. They wanted to make sure the provider is okay with patient taking both medications. Best callback is [phone].

## 2022-08-01 NOTE — Telephone Encounter (Signed)
Sent BS monitor to [facility]../lm,b

## 2022-08-01 NOTE — Telephone Encounter (Signed)
Prescription Request  08/01/2022  LOV: 07/27/2022  What is the name of the medication or equipment? Pt stated he need an accu check meter please advise.  Have you contacted your pharmacy to request a refill? No   Which pharmacy would you like this sent to?     [facility] #[id number] [place], [place] - [phone] AT [facility] OF [facility] & [address] [place] [address] Phone: [phone] Fax: [phone]  Patient notified that their request is being sent to the clinical staff for review and that they should receive a response within 2 business days.   Please advise at Mobile [phone] (mobile)

## 2022-08-02 ENCOUNTER — Telehealth: Payer: Self-pay | Admitting: Internal Medicine

## 2022-08-02 LAB — LIPOPROTEIN A (LPA): Lipoprotein (a): <10 nmol/L (ref <75)

## 2022-08-02 NOTE — Telephone Encounter (Signed)
Pharmacist at [facility] has been informed ok for pt to take both medications.

## 2022-08-02 NOTE — Telephone Encounter (Signed)
Prescription Request  08/02/2022  LOV: 07/27/2022  What is the name of the medication or equipment? olmesartan (BENICAR) 40 MG tablet   Have you contacted your pharmacy to request a refill? No   Which pharmacy would you like this sent to?     [facility] - [place] - [address] [place] [address] Phone: [phone] Fax: [phone]  Patient notified that their request is being sent to the clinical staff for review and that they should receive a response within 2 business days.   Please advise at Mobile [phone] (mobile)

## 2022-08-02 NOTE — Telephone Encounter (Signed)
Refill request too early.   90d supply sent on 6/18 to [facility] mail order.

## 2022-08-03 ENCOUNTER — Other Ambulatory Visit: Payer: Self-pay | Admitting: Internal Medicine

## 2022-08-03 ENCOUNTER — Other Ambulatory Visit: Payer: Self-pay | Admitting: Endocrinology

## 2022-08-03 DIAGNOSIS — E1140 Type 2 diabetes mellitus with diabetic neuropathy, unspecified: Secondary | ICD-10-CM

## 2022-08-03 DIAGNOSIS — F330 Major depressive disorder, recurrent, mild: Secondary | ICD-10-CM

## 2022-08-03 DIAGNOSIS — F409 Phobic anxiety disorder, unspecified: Secondary | ICD-10-CM

## 2022-08-03 DIAGNOSIS — E785 Hyperlipidemia, unspecified: Secondary | ICD-10-CM

## 2022-08-07 ENCOUNTER — Other Ambulatory Visit (INDEPENDENT_AMBULATORY_CARE_PROVIDER_SITE_OTHER): Payer: 59

## 2022-08-07 DIAGNOSIS — E1165 Type 2 diabetes mellitus with hyperglycemia: Secondary | ICD-10-CM

## 2022-08-07 DIAGNOSIS — Z794 Long term (current) use of insulin: Secondary | ICD-10-CM | POA: Diagnosis not present

## 2022-08-07 LAB — BASIC METABOLIC PANEL
BUN: 12 mg/dL (ref 6–23)
CO2: 26 mEq/L (ref 19–32)
Calcium: 9.5 mg/dL (ref 8.4–10.5)
Chloride: 102 mEq/L (ref 96–112)
Creatinine, Ser: 1.19 mg/dL (ref 0.40–1.50)
GFR: 64.44 mL/min (ref >60.00)
Glucose, Bld: 181 mg/dL — ABNORMAL HIGH (ref 70–99)
Potassium: 4.2 mEq/L (ref 3.5–5.1)
Sodium: 137 mEq/L (ref 135–145)

## 2022-08-07 LAB — HEMOGLOBIN A1C: Hgb A1c MFr Bld: 7.8 % — ABNORMAL HIGH (ref 4.6–6.5)

## 2022-08-07 NOTE — Addendum Note (Signed)
Addended by: Barnet Glasgow on: 08/07/2022 03:35 PM   Modules accepted: Orders

## 2022-08-08 ENCOUNTER — Ambulatory Visit: Payer: 59 | Admitting: Internal Medicine

## 2022-08-08 ENCOUNTER — Other Ambulatory Visit: Payer: Self-pay | Admitting: Internal Medicine

## 2022-08-08 DIAGNOSIS — I10 Essential (primary) hypertension: Secondary | ICD-10-CM

## 2022-08-09 ENCOUNTER — Other Ambulatory Visit: Payer: Self-pay

## 2022-08-09 DIAGNOSIS — E1165 Type 2 diabetes mellitus with hyperglycemia: Secondary | ICD-10-CM

## 2022-08-09 MED ORDER — TOUJEO SOLOSTAR 300 UNIT/ML ~~LOC~~ SOPN
PEN_INJECTOR | SUBCUTANEOUS | 1 refills | Status: DC
Start: 2022-08-09 — End: 2022-09-04

## 2022-08-11 ENCOUNTER — Ambulatory Visit (INDEPENDENT_AMBULATORY_CARE_PROVIDER_SITE_OTHER): Payer: 59 | Admitting: Endocrinology

## 2022-08-11 ENCOUNTER — Encounter: Payer: Self-pay | Admitting: Endocrinology

## 2022-08-11 VITALS — BP 130/60 | HR 73 | Ht 73.0 in | Wt 196.4 lb

## 2022-08-11 DIAGNOSIS — I10 Essential (primary) hypertension: Secondary | ICD-10-CM | POA: Diagnosis not present

## 2022-08-11 DIAGNOSIS — Z794 Long term (current) use of insulin: Secondary | ICD-10-CM

## 2022-08-11 DIAGNOSIS — E1165 Type 2 diabetes mellitus with hyperglycemia: Secondary | ICD-10-CM

## 2022-08-11 MED ORDER — OZEMPIC (0.25 OR 0.5 MG/DOSE) 2 MG/3ML ~~LOC~~ SOPN: 0.5000 mg | PEN_INJECTOR | SUBCUTANEOUS | 1 refills | Status: DC

## 2022-08-11 NOTE — Patient Instructions (Addendum)
Check blood sugars on waking up   Also check blood sugars about 2 hours after meals and do this after different meals by rotation  Recommended blood sugar levels on waking up are 90-130 and about 2 hours after meal is 130-160  Please bring your blood sugar monitor to each visit, thank you   Avoid cereal

## 2022-08-11 NOTE — Progress Notes (Unsigned)
Patient ID: Nicholas Whitehead, male   DOB: [birth year], 67 y.o.   MRN: [id number]           Patient ID: Nicholas Whitehead, male   DOB: 11-Jul-1955, 67 y.o.   MRN: [id number]   Reason for Appointment: Type II Diabetes follow-up   History of Present Illness   Diagnosis date: 1998    Previous history: He was started on oral agents at diagnosis and around 2006 was put on insulin His blood sugars are usually well controlled when he takes Actos along with his insulin. He had been on Invokana since 04/2013  He requires large doses of insulin especially basal insulin   Recent history:   Insulin regimen: Toujeo: 20 units bid; Novolog 20 units before meals         Oral hypoglycemic drugs: Actos 30 mg, Ozempic 0.5 weekly, Farxiga 10mg   A1c is slightly better at 7.8 compared to 8%  Fructosamine previously 316  Current management, blood sugar patterns and problems as of 10/23: He is having appearing to have fairly good blood sugar control with his libre sensor with 86% within the target range through July 20 However since he says that his sensor does not like to 14 days he has not used it for the last 10 days or so With his Accu-Chek monitor his morning sugars will increase fasting RANGE 140-165  He has taken Ozempic regularly he says that he gets nauseated with Ozempic 1 mg Currently unclear what dose he is actually taking as he may be following up only 1 or 2 turns of the pen instead of 2-3 He has not adjusted his insulin despite some high readings fasting and not adjusting the mealtime dose based on his MEAL size no recent weight loss No hypoglycemia Average from his recent Accu-Chek data 153  Dinner 8-9 pm     Side effects from medications:  urgency, frequent urination from Invokana   Interpretation of his freestyle libre version 3 download for the 2 weeks prior to July 20 as follows 2  His blood sugars overall show fairly stable pattern There have been almost consistently within the target  range Overnight data indicates blood sugars are in the 150s through about 3 AM and then somewhat lower by 6 AM but still averaging about 130-150 Somewhat variable postprandial readings at times but appear to be very few mealtime spikes Occasionally blood sugars may ride persistently higher for several hours  No hypoglycemia  CGM use % of time   2-week average/GV 142  Time in range     87   %  % Time Above 180 13  % Time above 250   % Time Below 70      PRE-MEAL Fasting Lunch Dinner Bedtime Overall  Glucose range:       Averages:        POST-MEAL PC Breakfast PC Lunch PC Dinner  Glucose range:     Averages:        CGM use % of time   2-week average/GV 164  Time in range        %  % Time Above 180 29  % Time above 250 2  % Time Below 70      PRE-MEAL Fasting Lunch Dinner 12 AM Overall  Glucose range:       Averages: 152   170     POST-MEAL PC Breakfast PC Lunch PC Dinner  Glucose range:     Averages: 163 171 169  Meals: Breakfast variable, 9-10 AM Lunch 2-3pm  usually avoiding high-fat meals, dinner at about 6 pm          Dietician visit: Most recent: 2013    Weight control:  Wt Readings from Last 3 Encounters:  08/11/22 196 lb 6.4 oz (89.1 kg)  07/27/22 190 lb (86.2 kg)  05/05/22 197 lb (89.4 kg)          Diabetes labs:  Lab Results  Component Value Date   HGBA1C 7.8 (H) 08/07/2022   HGBA1C 8.0 (H) 05/02/2022   HGBA1C 9.2 (A) 02/02/2022   Lab Results  Component Value Date   MICROALBUR <0.7 07/27/2022   LDLCALC 95 01/24/2022   CREATININE 1.19 08/07/2022   Lab Results  Component Value Date   FRUCTOSAMINE 316 (H) 03/10/2021   FRUCTOSAMINE 367 (H) 02/01/2021   FRUCTOSAMINE 280 09/11/2019   FRUCTOSAMINE 298 (H) 04/28/2019   FRUCTOSAMINE 276 09/13/2018    Other active problems discussed today: See review of systems   Lab on 08/07/2022  Component Date Value Ref Range Status   Hgb A1c MFr Bld 08/07/2022 7.8 (H)  4.6 - 6.5 % Final    Glycemic Control Guidelines for People with Diabetes:Non Diabetic:  <6%Goal of Therapy: <7%Additional Action Suggested:  >8%    Sodium 08/07/2022 137  135 - 145 mEq/L Final   Potassium 08/07/2022 4.2  3.5 - 5.1 mEq/L Final   Chloride 08/07/2022 102  96 - 112 mEq/L Final   CO2 08/07/2022 26  19 - 32 mEq/L Final   Glucose, Bld 08/07/2022 181 (H)  70 - 99 mg/dL Final   BUN 78/29/5621 12  6 - 23 mg/dL Final   Creatinine, Ser 08/07/2022 1.19  0.40 - 1.50 mg/dL Final   GFR 30/86/5784 64.44  >60.00 mL/min Final   Calculated using the CKD-EPI Creatinine Equation (2021)   Calcium 08/07/2022 9.5  8.4 - 10.5 mg/dL Final     Allergies as of 08/11/2022       Reactions   Benazepril Cough   Molds & Smuts    Hard to breathe   Pravastatin Other (See Comments)   myositis   Glipizide Nausea Only        Medication List        Accurate as of August 11, 2022 11:18 AM. If you have any questions, ask your nurse or doctor.          Accu-Chek Guide test strip Generic drug: glucose blood 1 each by Other route 4 (four) times daily. TEST BLOOD SUGAR FOUR TIMES DAILY   Accu-Chek Guide w/Device Kit Use to check blood sugars four times a day   albuterol 108 (90 Base) MCG/ACT inhaler Commonly known as: VENTOLIN HFA Inhale 2 puffs into the lungs every 6 (six) hours as needed for wheezing or shortness of breath.   aspirin EC 81 MG tablet Take 81 mg by mouth every morning.   B-D SINGLE USE SWABS REGULAR Pads USE AS DIRECTED   B-D ULTRAFINE III SHORT PEN 31G X 8 MM Misc Generic drug: Insulin Pen Needle USE TO INJECT INSULN AS DIRECTED THREE TIMES DAILY PER HUMALOG PRESCRIPTION   Belsomra 20 MG Tabs Generic drug: Suvorexant Take 1 tablet (20 mg total) by mouth at bedtime as needed.   Bevespi Aerosphere 9-4.8 MCG/ACT Aero Generic drug: Glycopyrrolate-Formoterol Inhale 2 puffs into the lungs 2 (two) times daily.   cetirizine 10 MG tablet Commonly known as: ZYRTEC Take 10 mg by mouth  daily.   Cholecalciferol 50 MCG (  2000 UT) Tabs Take 1 tablet (2,000 Units total) by mouth daily.   dapagliflozin propanediol 10 MG Tabs tablet Commonly known as: Farxiga Take 1 tablet (10 mg total) by mouth daily before breakfast.   DULoxetine 60 MG capsule Commonly known as: CYMBALTA TAKE 1 CAPSULE(60 MG) BY MOUTH DAILY   gabapentin 300 MG capsule Commonly known as: NEURONTIN Take 1 capsule (300 mg total) by mouth 3 (three) times daily.   Glucagon Emergency 1 MG Kit Inject 1 mg into the skin once for 1 dose.   Gvoke HypoPen 2-Pack 1 MG/0.2ML Soaj Generic drug: Glucagon Inject 1 Act into the skin daily as needed.   HumaLOG KwikPen 100 UNIT/ML KwikPen Generic drug: insulin lispro ADMINISTER 25 TO 30 UNITS UNDER THE SKIN THREE TIMES DAILY   Lancets Misc Inject 1 Act into the skin 4 (four) times daily. Use to test blood sugar tid. DX: E11.8   mirtazapine 7.5 MG tablet Commonly known as: REMERON TAKE 1 TABLET(7.5 MG) BY MOUTH AT BEDTIME   mupirocin ointment 2 % Commonly known as: BACTROBAN Apply 1 Application topically 2 (two) times daily.   Nebivolol HCl 20 MG Tabs TAKE 1 TABLET(20 MG) BY MOUTH DAILY   Odefsey 200-25-25 MG Tabs tablet Generic drug: emtricitabine-rilpivir-tenofovir AF Take 1 tablet by mouth daily.   olmesartan 40 MG tablet Commonly known as: BENICAR TAKE 1 TABLET(40 MG) BY MOUTH DAILY   Ozempic (1 MG/DOSE) 4 MG/3ML Sopn Generic drug: Semaglutide (1 MG/DOSE) Inject 1 mg weekly   pioglitazone 30 MG tablet Commonly known as: ACTOS TAKE 1 TABLET(30 MG) BY MOUTH DAILY   Pitavastatin Calcium 1 MG Tabs Commonly known as: Livalo TAKE 1 TABLET(1 MG) BY MOUTH DAILY   torsemide 20 MG tablet Commonly known as: DEMADEX TAKE ONE TABLET BY MOUTH ON MONDAY, WEDNESDAY AND FRIDAY   Toujeo SoloStar 300 UNIT/ML Solostar Pen Generic drug: insulin glargine (1 Unit Dial) ADMINISTER 44 UNITS UNDER THE SKIN DAILY   traZODone 150 MG tablet Commonly known as:  DESYREL Take 3 tablets (450 mg total) by mouth at bedtime.        Allergies:  Allergies  Allergen Reactions   Benazepril Cough   Molds & Smuts     Hard to breathe   Pravastatin Other (See Comments)    myositis   Glipizide Nausea Only    Past Medical History:  Diagnosis Date   Anxiety    Cataract    right    Depression    Diabetes mellitus without complication (HCC)    DM (diabetes mellitus screen)    HIV disease (HCC) 09/12/1995   HIV infection (HCC)    Hypercholesterolemia    Hyperlipidemia    Hypertension     Past Surgical History:  Procedure Laterality Date   COLONOSCOPY WITH PROPOFOL  01/16/2012   Procedure: COLONOSCOPY WITH PROPOFOL;  Surgeon: Charolett Bumpers, MD;  Location: [facility];  Service: Endoscopy;  Laterality: N/A;   EYE SURGERY  2005   catarct surgery   optic lens surgery  2005    Family History  Problem Relation Age of Onset   Arthritis Mother    Diabetes Mother    Heart disease Mother    Stroke Mother    Arthritis Father    Diabetes Father    Heart disease Father     Social History:  reports that he has been smoking cigarettes. He has never used smokeless tobacco. He reports that he does not drink alcohol and does not use drugs.  Review  of Systems:  Hypertension:  on Bystolic 20 mg and on Benicar 40 mg, prescribed by his PCP   BP Readings from Last 3 Encounters:  08/11/22 130/60  07/27/22 128/78  05/05/22 118/72   RENAL function is recently stable Normal results of microalbumin as of 4/23   Lab Results  Component Value Date   CREATININE 1.19 08/07/2022   CREATININE 1.19 05/02/2022   CREATININE 1.06 01/24/2022    Lipids: LDL is at target as follows Treatment prescribed by PCP, he is on Livalo 1 mg   Lab Results  Component Value Date   CHOL 165 01/24/2022   HDL 43 01/24/2022   LDLCALC 95 01/24/2022   TRIG 170 (H) 01/24/2022   CHOLHDL 3.8 01/24/2022    Neuropathy: Has had paresthesiae in feet and legs, these are  treated with gabapentin 300 mg 3 times a day  Forms for the diabetic shoes were sent in July 2021 to the podiatrist. Has sensory loss on exam    Examination:   BP 130/60   Pulse 73   Ht 6\' 1"  (1.854 m)   Wt 196 lb 6.4 oz (89.1 kg)   SpO2 95%   BMI 25.91 kg/m   Body mass index is 25.91 kg/m.      ASSESSMENT/ PLAN: April   Diabetes type 2 on insulin:   See history of present illness for detailed discussion of his current management, blood sugar patterns and problems identified  His A1c is 7.8  He is on basal bolus insulin, Farxiga, Ozempic 1 mg weekly and Actos 30 mg  His blood sugars are generally better with continuing on Ozempic Also because of nausea he has not taken the 1 mg dose of Ozempic but still benefiting from the smaller dose Recent data appears to show some slightly higher fasting readings but his sensor time in range 86% Previously slightly better  Recommendations:   He will continue the same doses of insulin for now However if his morning sugars are consistently over 130 he will go up 2 units on his Toujeo We will cut back on cereal in the morning but if he does eat this he will need to take 25 to 30 units of Humalog with this More protein consistently at all meals No change in [place] Keep the portions of carbohydrates controlled at meals Since he has nausea with the 1 mg of Ozempic he will go down to 0.5 mg weekly, new prescription sent Recently not monitoring consistently with the sensor but really not able to upgrade to the newer version or the Dexcom  Renal function continues to be stable continuing [place] along with blood pressure medication  Hypertension: Well-controlled now with Benicar 40  Microalbumin normal  Patient Instructions  Check blood sugars on waking up   Also check blood sugars about 2 hours after meals and do this after different meals by rotation  Recommended blood sugar levels on waking up are 90-130 and about 2 hours after  meal is 130-160  Please bring your blood sugar monitor to each visit, thank you       Reather Littler 08/11/2022, 11:18 AM           ..

## 2022-08-14 ENCOUNTER — Encounter: Payer: Self-pay | Admitting: Internal Medicine

## 2022-08-28 ENCOUNTER — Encounter: Payer: Self-pay | Admitting: Internal Medicine

## 2022-08-28 ENCOUNTER — Other Ambulatory Visit: Payer: Self-pay

## 2022-08-28 ENCOUNTER — Ambulatory Visit (INDEPENDENT_AMBULATORY_CARE_PROVIDER_SITE_OTHER): Payer: 59 | Admitting: Internal Medicine

## 2022-08-28 VITALS — BP 94/57 | HR 73 | Temp 97.7°F | Wt 197.0 lb

## 2022-08-28 DIAGNOSIS — B20 Human immunodeficiency virus [HIV] disease: Secondary | ICD-10-CM | POA: Diagnosis not present

## 2022-08-28 NOTE — Progress Notes (Signed)
   Subjective:    Patient ID: Nicholas Whitehead, male    DOB: [birth year], 67 y.o.   MRN: [id number]  HPI Nicholas Whitehead is here for follow up of HIV He continues on [facility] with no missed doses.  No issues with getting or taking his medication.  Treated this year for osteomyelitis of his right 1st toe. Seeing endocrinology.     Review of Systems  Constitutional:  Negative for fatigue.  Gastrointestinal:  Negative for diarrhea and nausea.  Skin:  Negative for rash.       Objective:   Physical Exam Eyes:     General: No scleral icterus. Pulmonary:     Effort: Pulmonary effort is normal.  Neurological:     Mental Status: He is alert.   SH: + tobacco        Assessment & Plan:

## 2022-08-28 NOTE — Assessment & Plan Note (Addendum)
He continues to do well on Odefsey and no concerns.  Labs reviewed with him.  I discussed other options with no food requirement but he prefers to stay on his current regimen Discussed anal pap and indications.  He has declined.   Follow up in 6 months  I have personally spent 30 minutes involved in face-to-face and non-face-to-face activities for this patient on the day of the visit. Professional time spent includes the following activities: Preparing to see the patient (review of tests), Obtaining and/or reviewing separately obtained history (admission/discharge record), Performing a medically appropriate examination and/or evaluation , Ordering medications/tests/procedures, referring and communicating with other health care professionals, Documenting clinical information in the EMR, Independently interpreting results (not separately reported), Communicating results to the patient/family/caregiver, Counseling and educating the patient/family/caregiver and Care coordination (not separately reported).

## 2022-08-29 ENCOUNTER — Ambulatory Visit (INDEPENDENT_AMBULATORY_CARE_PROVIDER_SITE_OTHER): Payer: 59 | Admitting: Podiatry

## 2022-08-29 ENCOUNTER — Encounter: Payer: Self-pay | Admitting: Podiatry

## 2022-08-29 DIAGNOSIS — Z89419 Acquired absence of unspecified great toe: Secondary | ICD-10-CM | POA: Diagnosis not present

## 2022-08-29 DIAGNOSIS — L84 Corns and callosities: Secondary | ICD-10-CM

## 2022-08-29 DIAGNOSIS — B351 Tinea unguium: Secondary | ICD-10-CM | POA: Diagnosis not present

## 2022-08-29 DIAGNOSIS — E1140 Type 2 diabetes mellitus with diabetic neuropathy, unspecified: Secondary | ICD-10-CM

## 2022-08-29 DIAGNOSIS — Z794 Long term (current) use of insulin: Secondary | ICD-10-CM | POA: Diagnosis not present

## 2022-08-29 DIAGNOSIS — L97521 Non-pressure chronic ulcer of other part of left foot limited to breakdown of skin: Secondary | ICD-10-CM | POA: Diagnosis not present

## 2022-08-29 DIAGNOSIS — M79675 Pain in left toe(s): Secondary | ICD-10-CM

## 2022-08-29 DIAGNOSIS — M79674 Pain in right toe(s): Secondary | ICD-10-CM

## 2022-08-30 ENCOUNTER — Encounter: Payer: Self-pay | Admitting: Internal Medicine

## 2022-08-30 DIAGNOSIS — E119 Type 2 diabetes mellitus without complications: Secondary | ICD-10-CM | POA: Diagnosis not present

## 2022-08-30 DIAGNOSIS — H2512 Age-related nuclear cataract, left eye: Secondary | ICD-10-CM | POA: Diagnosis not present

## 2022-08-30 DIAGNOSIS — H43813 Vitreous degeneration, bilateral: Secondary | ICD-10-CM | POA: Diagnosis not present

## 2022-08-30 DIAGNOSIS — Z961 Presence of intraocular lens: Secondary | ICD-10-CM | POA: Diagnosis not present

## 2022-08-30 DIAGNOSIS — H524 Presbyopia: Secondary | ICD-10-CM | POA: Diagnosis not present

## 2022-08-30 LAB — HM DIABETES EYE EXAM

## 2022-09-03 NOTE — Progress Notes (Signed)
Subjective: Patient presents today at risk foot care. Patient has h/o NIDDM, neuropathy with amputation of partial amputation of left great toe and preulcerative lesion(s) of both feet and painful mycotic toenails that limit ambulation. Painful toenails interfere with ambulation. Aggravating factors include wearing enclosed shoe gear. Pain is relieved with periodic professional debridement. Painful preulcerative lesion(s) is/are aggravated when weightbearing with and without shoegear. Pain is relieved with periodic professional debridement.Nicholas Grandchild, MD is patient's PCP. Last visit was August 08, 2022.  Current Outpatient Medications on File Prior to Visit  Medication Sig Dispense Refill   ACCU-CHEK GUIDE test strip 1 each by Other route 4 (four) times daily. TEST BLOOD SUGAR FOUR TIMES DAILY 400 strip 1   albuterol (VENTOLIN HFA) 108 (90 Base) MCG/ACT inhaler Inhale 2 puffs into the lungs every 6 (six) hours as needed for wheezing or shortness of breath. 6.7 g 3   Alcohol Swabs (B-D SINGLE USE SWABS REGULAR) PADS USE AS DIRECTED 300 each 1   aspirin EC 81 MG tablet Take 81 mg by mouth every morning.     Blood Glucose Monitoring Suppl (ACCU-CHEK GUIDE) w/Device KIT Use to check blood sugars four times a day 1 kit 0   cetirizine (ZYRTEC) 10 MG tablet Take 10 mg by mouth daily.     Cholecalciferol 2000 units TABS Take 1 tablet (2,000 Units total) by mouth daily. 90 tablet 1   dapagliflozin propanediol (FARXIGA) 10 MG TABS tablet Take 1 tablet (10 mg total) by mouth daily before breakfast. 90 tablet 0   DULoxetine (CYMBALTA) 60 MG capsule TAKE 1 CAPSULE(60 MG) BY MOUTH DAILY 90 capsule 0   emtricitabine-rilpivir-tenofovir AF (ODEFSEY) 200-25-25 MG TABS tablet Take 1 tablet by mouth daily. 90 tablet 0   gabapentin (NEURONTIN) 300 MG capsule Take 1 capsule (300 mg total) by mouth 3 (three) times daily. 270 capsule 0   Glucagon (GVOKE HYPOPEN 2-PACK) 1 MG/0.2ML SOAJ Inject 1 Act into the skin  daily as needed. 2 mL 6   Glucagon, rDNA, (GLUCAGON EMERGENCY) 1 MG KIT Inject 1 mg into the skin once for 1 dose. 1 kit 5   Glycopyrrolate-Formoterol (BEVESPI AEROSPHERE) 9-4.8 MCG/ACT AERO Inhale 2 puffs into the lungs 2 (two) times daily. 32.1 g 1   HUMALOG KWIKPEN 100 UNIT/ML KwikPen ADMINISTER 25 TO 30 UNITS UNDER THE SKIN THREE TIMES DAILY 90 mL 1   Insulin Pen Needle (B-D ULTRAFINE III SHORT PEN) 31G X 8 MM MISC USE TO INJECT INSULN AS DIRECTED THREE TIMES DAILY PER HUMALOG PRESCRIPTION 500 each 1   Lancets MISC Inject 1 Act into the skin 4 (four) times daily. Use to test blood sugar tid. DX: E11.8 300 each 3   mirtazapine (REMERON) 7.5 MG tablet TAKE 1 TABLET(7.5 MG) BY MOUTH AT BEDTIME 90 tablet 0   mupirocin ointment (BACTROBAN) 2 % Apply 1 Application topically 2 (two) times daily. 30 g 2   Nebivolol HCl 20 MG TABS TAKE 1 TABLET(20 MG) BY MOUTH DAILY 90 tablet 1   olmesartan (BENICAR) 40 MG tablet TAKE 1 TABLET(40 MG) BY MOUTH DAILY 90 tablet 0   pioglitazone (ACTOS) 30 MG tablet TAKE 1 TABLET(30 MG) BY MOUTH DAILY 90 tablet 1   Pitavastatin Calcium (LIVALO) 1 MG TABS TAKE 1 TABLET(1 MG) BY MOUTH DAILY 90 tablet 0   Semaglutide,0.25 or 0.5MG /DOS, (OZEMPIC, 0.25 OR 0.5 MG/DOSE,) 2 MG/3ML SOPN Inject 0.5 mg into the skin once a week. 9 mL 1   Suvorexant (BELSOMRA) 20 MG TABS  Take 1 tablet (20 mg total) by mouth at bedtime as needed. 90 tablet 0   torsemide (DEMADEX) 20 MG tablet TAKE ONE TABLET BY MOUTH ON MONDAY, WEDNESDAY AND FRIDAY 36 tablet 2   TOUJEO SOLOSTAR 300 UNIT/ML Solostar Pen ADMINISTER 44 UNITS UNDER THE SKIN DAILY 6 mL 1   traZODone (DESYREL) 150 MG tablet Take 3 tablets (450 mg total) by mouth at bedtime. 270 tablet 0   No current facility-administered medications on file prior to visit.     Allergies  Allergen Reactions   Benazepril Cough   Molds & Smuts     Hard to breathe   Pravastatin Other (See Comments)    myositis   Glipizide Nausea Only      Objective: There were no vitals filed for this visit.  Nicholas Whitehead is a pleasant 67 y.o. male in NAD. AAO X 3.  Vascular Examination: CFT <4 seconds b/l LE. Palpable PT pulse(s) b/l LE. Faintly palpable DP pulses b/l LE. Pedal hair absent. No pain with calf compression b/l. No edema noted b/l LE. No ischemia or gangrene noted b/l LE. No cyanosis or clubbing noted b/l LE.  Dermatological Examination: No interdigital macerations noted b/l LE. Toenails 1-5 right foot and 2-5 left foot well maintained with adequate length. No erythema, no edema, no drainage, no fluctuance. Preulcerative lesion noted bilateral heels. There is visible subdermal hemorrhage. There is no surrounding erythema, no edema, no drainage, no odor, no fluctuance.    Wound Location: distal tip of left 2nd toe There is a minimal amount of devitalized tissue present in the wound. Predebridement Wound Measurement:  1.0 cm preulcerative lesion with hyperkeratotic roof and subdermal hemorrhage present. Postdebridement Wound Measurement: 0.2 x 0.2 x 0.1 cm. Wound Base: Granular/Healthy Peri-wound: Calloused Exudate: None: wound tissue dry Blood Loss during debridement: 0 cc('s). Material in wound which inhibits healing/promotes adjacent tissue breakdown:  nonviable hyperkeratosis. Description of tissue removed from ulceration today:  nonviable hyperkeratosis. Sign(s) of clinical bacterial infection: no clinical signs of infection noted on examination today.   Musculoskeletal Examination: Muscle strength 5/5 to all lower extremity muscle groups bilaterally. Severe hammertoe deformity noted 2-5 bilaterally.  Neurological Examination: Pt has subjective symptoms of neuropathy. Protective sensation diminished with 10g monofilament b/l.       Assessment and Plan 1. Pain due to onychomycosis of toenails of both feet   2. Ulcer of left second toe, limited to breakdown of skin (HCC)   3. Pre-ulcerative calluses   4.  History of amputation of great toe (HCC)   5. Type 2 diabetes mellitus with diabetic neuropathy, with long-term current use of insulin (HCC)   -Patient was evaluated and treated and all questions answered.  -Patient/POA/Family member educated on diagnosis and treatment plan of routine ulcer debridement/wound care.  -Ulceration debridement achieved utilizing sharp excisional debridement to level of subcutaneous tissue with sterile scalpel blade.. Type/amount of devitalized tissue removed: nonviable hyperkeratosis -Today's ulcer size post-debridement: 0.2 x 0.2 x 0.1 cm. -Ulceration cleansed with wound cleanser. Betadine Solution applied to base of ulceration and secured with light dressing. Patient to apply same to digit once daily. Call office if he has any concerns. -Wound responded well to today's debridement. -Patient risk factors affecting healing of ulcer: diabetes, diabetic neuropathy, foot deformity, history of foot/leg ulcer, history of amputation left great toe, PAD, HTN, CKD, hyperlipidemia, current tobacco user, HIV -Frequency of debridements needed to achieve healing: weekly to biweekly. -Toenails were debrided in length and girth 1-5 right foot and  2-5 left foot with sterile nail nippers and dremel without iatrogenic bleeding.  -Follow up one week with Dr. Lilian Kapur for ulcer distal tip left 2nd digit. -Patient/POA to call should there be question/concern in the interim.  No follow-ups on file.  Freddie Breech, DPM

## 2022-09-04 ENCOUNTER — Other Ambulatory Visit: Payer: Self-pay | Admitting: Internal Medicine

## 2022-09-04 ENCOUNTER — Other Ambulatory Visit: Payer: Self-pay | Admitting: Endocrinology

## 2022-09-04 DIAGNOSIS — I10 Essential (primary) hypertension: Secondary | ICD-10-CM

## 2022-09-04 DIAGNOSIS — E1165 Type 2 diabetes mellitus with hyperglycemia: Secondary | ICD-10-CM

## 2022-09-04 DIAGNOSIS — Z794 Long term (current) use of insulin: Secondary | ICD-10-CM

## 2022-09-04 DIAGNOSIS — F419 Anxiety disorder, unspecified: Secondary | ICD-10-CM

## 2022-09-05 ENCOUNTER — Ambulatory Visit: Payer: 59 | Admitting: Podiatry

## 2022-09-05 ENCOUNTER — Encounter: Payer: Self-pay | Admitting: Podiatry

## 2022-09-05 DIAGNOSIS — M2042 Other hammer toe(s) (acquired), left foot: Secondary | ICD-10-CM

## 2022-09-05 NOTE — Progress Notes (Signed)
  Subjective:  Patient ID: Nicholas Whitehead, male    DOB: 1955/05/31,  MRN: [id number]  Chief Complaint  Patient presents with   Diabetes    Pt present today for a diabetic ulcer follow up. Pt stated that he is in pain.    67 y.o. male presents with the above complaint. History confirmed with patient.  He has new ulceration developing on the left second toe.  Other sites remain well-healed.  The other toes curled down.  He would like to have more definitive treatment to prevent this recurrence from happening on all his other toes  Objective:  Physical Exam: warm, good capillary refill, normal DP and PT pulses, normal sensory exam, and partial-thickness skin breakdown without signs of infection of distal tip of second toe, semirigid hammertoe and reducible hammertoes on remaining toes..  Assessment:   1. Hammer toe of left foot      Plan:  Patient was evaluated and treated and all questions answered.  Has new ulceration secondary to hammertoe deformity of the left second toe.  Discussed treatment options of this and I recommended a flexor tenotomy to facilitate soft tissue wound healing to reduce the contracture.  If this is successful we may plan for further percutaneous tenotomy's on the remaining toes to reduce the risk of this happening on the further digits.  We discussed the risks and benefits of this including the risk of infection or nonhealing.  Also discussed the chance of failure of the procedure and recurrence.  Following consent and prepped with Betadine, the left second toe was anesthetized with 1.5 cc of Marcaine and lidocaine.  A tourniquet was secured around the base of the toe, and a percutaneous tenotomy of the long flexor tendon was performed.  He tolerated procedure well.  He was dressed with Silvadene and bandage and a Coban splint he will continue for the next 2 weeks in order to maintain position of the correction.  I will see him back in 5 weeks for follow-up.  Return  in about 5 weeks (around 10/10/2022) for L 2nd tenotomy post op (no x-rays).

## 2022-09-13 ENCOUNTER — Other Ambulatory Visit: Payer: Self-pay | Admitting: Internal Medicine

## 2022-09-13 DIAGNOSIS — J410 Simple chronic bronchitis: Secondary | ICD-10-CM

## 2022-10-03 ENCOUNTER — Other Ambulatory Visit: Payer: Self-pay | Admitting: Internal Medicine

## 2022-10-03 DIAGNOSIS — F32A Depression, unspecified: Secondary | ICD-10-CM

## 2022-10-03 DIAGNOSIS — F409 Phobic anxiety disorder, unspecified: Secondary | ICD-10-CM

## 2022-10-03 DIAGNOSIS — F330 Major depressive disorder, recurrent, mild: Secondary | ICD-10-CM

## 2022-10-03 DIAGNOSIS — E1140 Type 2 diabetes mellitus with diabetic neuropathy, unspecified: Secondary | ICD-10-CM

## 2022-10-10 ENCOUNTER — Ambulatory Visit (INDEPENDENT_AMBULATORY_CARE_PROVIDER_SITE_OTHER): Payer: 59 | Admitting: Podiatry

## 2022-10-10 ENCOUNTER — Encounter: Payer: Self-pay | Admitting: Podiatry

## 2022-10-10 DIAGNOSIS — M79674 Pain in right toe(s): Secondary | ICD-10-CM

## 2022-10-10 DIAGNOSIS — M2042 Other hammer toe(s) (acquired), left foot: Secondary | ICD-10-CM

## 2022-10-10 DIAGNOSIS — M79675 Pain in left toe(s): Secondary | ICD-10-CM

## 2022-10-10 DIAGNOSIS — B351 Tinea unguium: Secondary | ICD-10-CM

## 2022-10-10 NOTE — Progress Notes (Signed)
Subjective:  Patient ID: Nicholas Whitehead, male    DOB: 27-Mar-1955,  MRN: [id number]  Chief Complaint  Patient presents with   Hammer Toe    Left 2nd toe, hx tenotomy. Lesser toes painful    DOS: 09/05/2022 Procedure: Left second toe percutaneous tenotomy  67 y.o. male returns for post-op check.  He notes the toe is straighter, still having pain in the toe  Review of Systems: Negative except as noted in the HPI. Denies N/V/F/Ch.   Objective:  There were no vitals filed for this visit. There is no height or weight on file to calculate BMI. Constitutional Well developed. Well nourished.  Vascular Foot warm and well perfused. Capillary refill normal to all digits.  Calf is soft and supple, no posterior calf or knee pain, negative Homans' sign  Neurologic Normal speech. Oriented to person, place, and time. Epicritic sensation to light touch grossly present bilaterally.  Dermatologic Thickened elongated dystrophic nails x 9, the left second nail and toe has a dystrophic callus and preulcerative callus at the tip  Orthopedic: Tenderness to palpation noted about the surgical site.  Improved position    Assessment:   1. Hammer toe of left foot   2. Pain due to onychomycosis of toenails of both feet    Plan:  Patient was evaluated and treated and all questions answered.  S/p foot surgery left -Progressing as expected post-operatively.  Showing quite a bit of improvement, I debrided the overlying hyperkeratosis and nail from the tip of the toe which should alleviate the remainder of the pain.  We discussed the option of further tenotomy's on other toes that are affected but currently these are not prickly painful.  I will reevaluate him in 6 weeks.  Nails were debrided in length and thickness with a sharp nail nipper today as a courtesy.  Return in about 6 weeks (around 11/21/2022) for follow up on hammertoe procedure.

## 2022-10-24 ENCOUNTER — Ambulatory Visit (INDEPENDENT_AMBULATORY_CARE_PROVIDER_SITE_OTHER): Payer: 59 | Admitting: Radiology

## 2022-10-24 DIAGNOSIS — Z23 Encounter for immunization: Secondary | ICD-10-CM | POA: Diagnosis not present

## 2022-10-24 NOTE — Progress Notes (Signed)
Patient here for Flu shot. Patient tolerated well with no complications.

## 2022-11-02 ENCOUNTER — Other Ambulatory Visit: Payer: Self-pay | Admitting: Internal Medicine

## 2022-11-02 DIAGNOSIS — B20 Human immunodeficiency virus [HIV] disease: Secondary | ICD-10-CM

## 2022-11-03 ENCOUNTER — Other Ambulatory Visit: Payer: Self-pay | Admitting: Internal Medicine

## 2022-11-03 DIAGNOSIS — E1140 Type 2 diabetes mellitus with diabetic neuropathy, unspecified: Secondary | ICD-10-CM

## 2022-11-03 DIAGNOSIS — E785 Hyperlipidemia, unspecified: Secondary | ICD-10-CM

## 2022-11-07 ENCOUNTER — Encounter: Payer: Self-pay | Admitting: Podiatry

## 2022-11-07 ENCOUNTER — Ambulatory Visit (INDEPENDENT_AMBULATORY_CARE_PROVIDER_SITE_OTHER): Payer: 59 | Admitting: Podiatry

## 2022-11-07 DIAGNOSIS — M2042 Other hammer toe(s) (acquired), left foot: Secondary | ICD-10-CM

## 2022-11-07 NOTE — Progress Notes (Signed)
Subjective:  Patient ID: Nicholas Whitehead, male    DOB: 1955-09-25,  MRN: [id number]  Chief Complaint  Patient presents with   Toe Pain    Follow up hammertoe 2nd left   "This toe still bothers me. I guess he is trying to see if we want to do surgery"   Diabetes    Last A1c was 7.8    DOS: 09/05/2022 Procedure: Left second toe percutaneous tenotomy  67 y.o. male returns for post-op check.    Review of Systems: Negative except as noted in the HPI. Denies N/V/F/Ch.   Objective:  There were no vitals filed for this visit. There is no height or weight on file to calculate BMI. Constitutional Well developed. Well nourished.  Vascular Foot warm and well perfused. Capillary refill normal to all digits.  Calf is soft and supple, no posterior calf or knee pain, negative Homans' sign  Neurologic Normal speech. Oriented to person, place, and time. Epicritic sensation to light touch grossly present bilaterally.  Dermatologic Some residual callus no residual ulceration  Orthopedic: Tenderness to palpation noted about the surgical site.  Improved position    Assessment:   1. Hammer toe of left foot    Plan:  Patient was evaluated and treated and all questions answered.  S/p foot surgery left -Doing much better debrided the overlying callus, he is currently in the process of moving apartments and he will follow-up me in 1 month to reevaluate and we likely will plan for surgical tenotomy's of all remaining toes at this time.  Return in about 5 weeks (around 12/12/2022) for follow up on flexor tenotomy, plan other toes, post op (no x-rays).

## 2022-11-21 ENCOUNTER — Ambulatory Visit (INDEPENDENT_AMBULATORY_CARE_PROVIDER_SITE_OTHER): Payer: 59 | Admitting: Podiatry

## 2022-11-21 ENCOUNTER — Encounter: Payer: Self-pay | Admitting: Podiatry

## 2022-11-21 DIAGNOSIS — M2042 Other hammer toe(s) (acquired), left foot: Secondary | ICD-10-CM

## 2022-11-21 DIAGNOSIS — M2041 Other hammer toe(s) (acquired), right foot: Secondary | ICD-10-CM

## 2022-11-22 NOTE — Progress Notes (Signed)
  Subjective:  Patient ID: Nicholas Whitehead, male    DOB: 1955-08-21,  MRN: [id number]  Chief Complaint  Patient presents with   Hammer Toe    Follow up left foot patient states is doing well.    DOS: 09/05/2022 Procedure: Left second toe percutaneous tenotomy  67 y.o. male returns for post-op check.  The toe that was operated on is doing well.  He would like to proceed with surgery on the remaining toes  Review of Systems: Negative except as noted in the HPI. Denies N/V/F/Ch.   Objective:  There were no vitals filed for this visit. There is no height or weight on file to calculate BMI. Constitutional Well developed. Well nourished.  Vascular Foot warm and well perfused. Capillary refill normal to all digits.  Calf is soft and supple, no posterior calf or knee pain, negative Homans' sign  Neurologic Normal speech. Oriented to person, place, and time. Epicritic sensation to light touch grossly present bilaterally.  Dermatologic Some residual callus no residual ulceration  Orthopedic: Mild tenderness to palpation.  Semireducible mallet and hammertoe deformities    Assessment:   1. Hammer toe of left foot   2. Hammertoe of right foot    Plan:  Patient was evaluated and treated and all questions answered.  S/p foot surgery left -Flexor tenotomy has improved the position of his toe.  He would like to have this done his other toes as to not have these progress into ulcerations as well.  We discussed the risks and benefits and recovery process which he is familiar with.  These be done in the operating room we we will do them simultaneously.  Discussed the risk benefits and potential locations including the risk of under correction overcorrection recurrence infection ulceration and loss of toe.  He understands and wishes to proceed.  All questions addressed.  Informed consent signed and reviewed.   Surgical plan:  Procedure: -Flexor tenotomy of left third fourth fifth and right  second third fourth fifth toes  Location: -[facility]  Anesthesia plan: -Sedation with local  Postoperative pain plan: - Tylenol 1000 mg every 6 hours   DVT prophylaxis: -None required  WB Restrictions / DME needs: -WBAT in postop shoes on both feet   No follow-ups on file.

## 2022-11-23 ENCOUNTER — Other Ambulatory Visit: Payer: Self-pay | Admitting: Internal Medicine

## 2022-11-24 ENCOUNTER — Other Ambulatory Visit: Payer: Self-pay

## 2022-11-24 DIAGNOSIS — E1165 Type 2 diabetes mellitus with hyperglycemia: Secondary | ICD-10-CM

## 2022-11-29 ENCOUNTER — Other Ambulatory Visit (INDEPENDENT_AMBULATORY_CARE_PROVIDER_SITE_OTHER): Payer: 59

## 2022-11-29 DIAGNOSIS — E1165 Type 2 diabetes mellitus with hyperglycemia: Secondary | ICD-10-CM | POA: Diagnosis not present

## 2022-11-29 DIAGNOSIS — Z794 Long term (current) use of insulin: Secondary | ICD-10-CM

## 2022-11-29 LAB — MICROALBUMIN / CREATININE URINE RATIO
Creatinine,U: 90.8 mg/dL
Microalb Creat Ratio: 0.9 mg/g (ref 0.0–30.0)
Microalb, Ur: 0.8 mg/dL (ref 0.0–1.9)

## 2022-11-29 LAB — BASIC METABOLIC PANEL
BUN: 12 mg/dL (ref 6–23)
CO2: 31 meq/L (ref 19–32)
Calcium: 9.8 mg/dL (ref 8.4–10.5)
Chloride: 99 meq/L (ref 96–112)
Creatinine, Ser: 1.25 mg/dL (ref 0.40–1.50)
GFR: 60.61 mL/min (ref >60.00)
Glucose, Bld: 215 mg/dL — ABNORMAL HIGH (ref 70–99)
Potassium: 4.2 meq/L (ref 3.5–5.1)
Sodium: 135 meq/L (ref 135–145)

## 2022-11-29 LAB — HEMOGLOBIN A1C: Hgb A1c MFr Bld: 8.1 % — ABNORMAL HIGH (ref 4.6–6.5)

## 2022-12-02 ENCOUNTER — Other Ambulatory Visit: Payer: Self-pay | Admitting: Internal Medicine

## 2022-12-02 DIAGNOSIS — I10 Essential (primary) hypertension: Secondary | ICD-10-CM

## 2022-12-02 DIAGNOSIS — F32A Depression, unspecified: Secondary | ICD-10-CM

## 2022-12-02 DIAGNOSIS — E1140 Type 2 diabetes mellitus with diabetic neuropathy, unspecified: Secondary | ICD-10-CM

## 2022-12-05 ENCOUNTER — Telehealth: Payer: Self-pay | Admitting: Podiatry

## 2022-12-05 NOTE — Telephone Encounter (Signed)
DOS-12/29/2022  TENOTOMY 2-5 RT, 3-5 LT-28010  [employer] EFFECTIVE DATE- 01/09/2022  DEDUCTIBLE- $240.00 WITH REMAINING $0.00 OOP- $8850.00 WITH REMAINING $8042.63 COINSURANCE- 20%  PER THE [employer] WEBSITE PORTAL, PRIOR AUTH IS NOT REQUIRED FOR CPT CODE 28010.  AUTH Decision ID #: [id number]

## 2022-12-06 ENCOUNTER — Encounter: Payer: Self-pay | Admitting: Endocrinology

## 2022-12-06 ENCOUNTER — Ambulatory Visit (INDEPENDENT_AMBULATORY_CARE_PROVIDER_SITE_OTHER): Payer: 59 | Admitting: Endocrinology

## 2022-12-06 VITALS — BP 106/60 | HR 70 | Resp 20 | Ht 73.0 in | Wt 194.0 lb

## 2022-12-06 DIAGNOSIS — E1140 Type 2 diabetes mellitus with diabetic neuropathy, unspecified: Secondary | ICD-10-CM | POA: Diagnosis not present

## 2022-12-06 DIAGNOSIS — E1165 Type 2 diabetes mellitus with hyperglycemia: Secondary | ICD-10-CM | POA: Diagnosis not present

## 2022-12-06 DIAGNOSIS — Z794 Long term (current) use of insulin: Secondary | ICD-10-CM | POA: Diagnosis not present

## 2022-12-06 MED ORDER — OZEMPIC (0.25 OR 0.5 MG/DOSE) 2 MG/3ML ~~LOC~~ SOPN: 0.5000 mg | PEN_INJECTOR | SUBCUTANEOUS | 3 refills | Status: DC

## 2022-12-06 MED ORDER — PIOGLITAZONE HCL 30 MG PO TABS: ORAL_TABLET | ORAL | 3 refills | Status: DC

## 2022-12-06 MED ORDER — FREESTYLE LIBRE 3 PLUS SENSOR MISC: 1.0000 | 4 refills | Status: DC

## 2022-12-06 MED ORDER — DAPAGLIFLOZIN PROPANEDIOL 10 MG PO TABS: 10.0000 mg | ORAL_TABLET | Freq: Every day | ORAL | 3 refills | Status: DC

## 2022-12-06 MED ORDER — TOUJEO SOLOSTAR 300 UNIT/ML ~~LOC~~ SOPN: PEN_INJECTOR | SUBCUTANEOUS | 4 refills | Status: DC

## 2022-12-06 MED ORDER — HUMALOG KWIKPEN 100 UNIT/ML ~~LOC~~ SOPN: PEN_INJECTOR | SUBCUTANEOUS | 1 refills | Status: DC

## 2022-12-06 NOTE — Progress Notes (Signed)
Outpatient Endocrinology Note [place] Rhianna Raulerson, MD  12/06/22  Patient's Name: Nicholas Whitehead    DOB: 12-06-1955    MRN: [id number]                                                    REASON OF VISIT: Follow up for type 2 diabetes mellitus  PCP: [name], MD  HISTORY OF PRESENT ILLNESS:   Terriel Eilts is a 67 y.o. old male with past medical history listed below, is here for follow up for type 2 diabetes mellitus.   Pertinent Diabetes History: Patient was diagnosed with type 2 diabetes mellitus in 1998.  Insulin therapy was started around 2006. He requires large doses of insulin especially basal insulin.  Chronic Diabetes Complications : Retinopathy: no. Last ophthalmology exam was done on 08/2022, annually, following with ophthalmology regularly.  Nephropathy: no, on ACE/ARB /olmesartan. Peripheral neuropathy: yes, on Has had paresthesiae in feet and legs, these are treated with gabapentin 300 mg 3 times a day.  He follows up with podiatry as well. Coronary artery disease: no Stroke: no  Relevant comorbidities and cardiovascular risk factors: Obesity: no Body mass index is 25.6 kg/m.  Hypertension: Yes  Hyperlipidemia : Yes, on statin   Current / Home Diabetic regimen includes:   Insulin regimen: Toujeo: 25-30 units bid; Humalog 20 - 25 units before meals          Oral hypoglycemic drugs: Actos 30 mg, Ozempic 0.5 weekly, Farxiga 10mg  daily.    Prior diabetic medications: Invokana in the past, increase urgency and frequency of urination.  Had nausea with Ozempic 1 mg.  Glycemic data:    CONTINUOUS GLUCOSE MONITORING SYSTEM (CGMS) INTERPRETATION: At today's visit, we reviewed CGM downloads. The full report is scanned in the media. Reviewing the CGM trends, blood glucose are as follows:  FreeStyle Libre 3 CGM-  Sensor Download (Sensor download was reviewed and summarized below.) Dates: November 14 to December 06, 2022, 14 days Sensor Average: 150  Glucose Management  Indicator: 6.9% Glucose Variability: 23.3%  % data captured: 79%  Glycemic Trends:  <54: 0% 54-70: 0% 71-180: 83% 181-250: 16% 251-400: 1%   Interpretation: -Mostly acceptable blood sugar.  Occasional mild hyperglycemia with blood sugar up to 200 range related with meals.  Blood sugar overnight and in between the meals are acceptable.  No hypoglycemia.  Hypoglycemia: Patient has no hypoglycemic episodes. Patient has hypoglycemia awareness.  Factors modifying glucose control: 1.  Diabetic diet assessment: 3 meals a day.  Usually avoiding high-fat meals.  2.  Staying active or exercising: No formal exercise.  3.  Medication compliance: compliant most of the time.  Interval history  CGM data as reviewed above.  Diabetes regimen as noted above.  Patient reports in last few months he is in the process of moving and may have missed some of the medications including insulin and also eating was somewhat different.  Recent hemoglobin A1c 8.1%, worsening.  GMI on CGM 6.9%.  He has no complaints today.  REVIEW OF SYSTEMS As per history of present illness.   PAST MEDICAL HISTORY: Past Medical History:  Diagnosis Date   Anxiety    Cataract    right    Depression    Diabetes mellitus without complication (HCC)    DM (diabetes mellitus screen)    HIV disease (HCC)  09/12/1995   HIV infection (HCC)    Hypercholesterolemia    Hyperlipidemia    Hypertension     PAST SURGICAL HISTORY: Past Surgical History:  Procedure Laterality Date   COLONOSCOPY WITH PROPOFOL  01/16/2012   Procedure: COLONOSCOPY WITH PROPOFOL;  Surgeon: Charolett Bumpers, MD;  Location: [facility];  Service: Endoscopy;  Laterality: N/A;   EYE SURGERY  2005   catarct surgery   optic lens surgery  2005    ALLERGIES: Allergies  Allergen Reactions   Benazepril Cough   Molds & Smuts     Hard to breathe   Pravastatin Other (See Comments)    myositis   Glipizide Nausea Only    FAMILY HISTORY:  Family History   Problem Relation Age of Onset   Arthritis Mother    Diabetes Mother    Heart disease Mother    Stroke Mother    Arthritis Father    Diabetes Father    Heart disease Father     SOCIAL HISTORY: Social History   Socioeconomic History   Marital status: Single    Spouse name: Not on file   Number of children: Not on file   Years of education: Not on file   Highest education level: Not on file  Occupational History   Occupation: disability  Tobacco Use   Smoking status: Every Day    Current packs/day: 0.25    Types: Cigarettes   Smokeless tobacco: Never   Tobacco comments:    Occ smoking/ trying to quit  Vaping Use   Vaping status: Never Used  Substance and Sexual Activity   Alcohol use: No    Alcohol/week: 0.0 standard drinks of alcohol    Comment: socially   Drug use: No   Sexual activity: Never    Birth control/protection: Other-see comments    Comment: declined condoms  Other Topics Concern   Not on file  Social History Narrative   Not on file   Social Determinants of Health   Financial Resource Strain: Low Risk  (01/31/2022)   Overall Financial Resource Strain (CARDIA)    Difficulty of Paying Living Expenses: Not hard at all  Food Insecurity: No Food Insecurity (01/31/2022)   Hunger Vital Sign    Worried About Running Out of Food in the Last Year: Never true    Ran Out of Food in the Last Year: Never true  Transportation Needs: No Transportation Needs (01/31/2022)   PRAPARE - Administrator, Civil Service (Medical): No    Lack of Transportation (Non-Medical): No  Physical Activity: Sufficiently Active (01/31/2022)   Exercise Vital Sign    Days of Exercise per Week: 5 days    Minutes of Exercise per Session: 30 min  Stress: No Stress Concern Present (01/31/2022)   [employer] of Occupational Health - Occupational Stress Questionnaire    Feeling of Stress : Not at all  Social Connections: Moderately Integrated (01/31/2022)   Social Connection  and Isolation Panel [NHANES]    Frequency of Communication with Friends and Family: More than three times a week    Frequency of Social Gatherings with Friends and Family: More than three times a week    Attends Religious Services: More than 4 times per year    Active Member of [employer] or Organizations: Yes    Attends Engineer, structural: More than 4 times per year    Marital Status: Never married    MEDICATIONS:  Current Outpatient Medications  Medication Sig Dispense Refill  ACCU-CHEK GUIDE test strip 1 each by Other route 4 (four) times daily. TEST BLOOD SUGAR FOUR TIMES DAILY 400 strip 1   albuterol (VENTOLIN HFA) 108 (90 Base) MCG/ACT inhaler INHALE TWO PUFFS BY MOUTH INTO LUNGS EVERY 6 HOURS AS NEEDED FOR WHEEZING OR SHORTNESS OF BREATH 6.7 g 3   Alcohol Swabs (B-D SINGLE USE SWABS REGULAR) PADS USE AS DIRECTED 300 each 1   aspirin EC 81 MG tablet Take 81 mg by mouth every morning.     BELSOMRA 20 MG TABS TAKE ONE TABLET BY MOUTH AT BEDTIME AS NEEDED 90 tablet 0   Blood Glucose Monitoring Suppl (ACCU-CHEK GUIDE) w/Device KIT Use to check blood sugars four times a day 1 kit 0   cetirizine (ZYRTEC) 10 MG tablet Take 10 mg by mouth daily.     Cholecalciferol 2000 units TABS Take 1 tablet (2,000 Units total) by mouth daily. 90 tablet 1   Continuous Glucose Sensor (FREESTYLE LIBRE 3 PLUS SENSOR) MISC Apply topically every 14 (fourteen) days.     Continuous Glucose Sensor (FREESTYLE LIBRE 3 PLUS SENSOR) MISC 1 each by Does not apply route continuous. Change every 15 days. 6 each 4   DULoxetine (CYMBALTA) 60 MG capsule TAKE ONE CAPSULE (60MG ) BY MOUTH DAILY AT 9AM 90 capsule 0   gabapentin (NEURONTIN) 300 MG capsule TAKE ONE CAPSULE BY MOUTH THREE TIMES DAILY @ 9AM-3PM-9PM 270 capsule 0   Glucagon (GVOKE HYPOPEN 2-PACK) 1 MG/0.2ML SOAJ Inject 1 Act into the skin daily as needed. 2 mL 6   Glycopyrrolate-Formoterol (BEVESPI AEROSPHERE) 9-4.8 MCG/ACT AERO Inhale 2 puffs into the lungs 2  (two) times daily. 32.1 g 1   Insulin Pen Needle (B-D ULTRAFINE III SHORT PEN) 31G X 8 MM MISC USE TO INJECT INSULIN AS DIRECTED THREE TIMES DAILY PER HUMALOG 500 each 1   Lancets MISC Inject 1 Act into the skin 4 (four) times daily. Use to test blood sugar tid. DX: E11.8 300 each 3   mirtazapine (REMERON) 7.5 MG tablet TAKE ONE TABLET BY MOUTH DAILY AT 9PM AT BEDTIME 90 tablet 0   mupirocin ointment (BACTROBAN) 2 % Apply 1 Application topically 2 (two) times daily. 30 g 2   Nebivolol HCl 20 MG TABS TAKE 1 TABLET(20 MG) BY MOUTH DAILY 90 tablet 1   ODEFSEY 200-25-25 MG TABS tablet TAKE 1 TABLET BY MOUTH DAILY 90 tablet 1   olmesartan (BENICAR) 40 MG tablet TAKE ONE TABLET (40MG ) BY MOUTH DAILY 90 tablet 0   Pitavastatin Calcium (LIVALO) 1 MG TABS TAKE 1 TABLET(1 MG) BY MOUTH DAILY 90 tablet 0   torsemide (DEMADEX) 20 MG tablet TAKE ONE TABLET BY MOUTH ON MONDAY, WEDNESDAY AND FRIDAY 36 tablet 2   traZODone (DESYREL) 150 MG tablet TAKE THREE TABLETS BY MOUTH DAILY AT 9PM AT BEDTIME 270 tablet 0   dapagliflozin propanediol (FARXIGA) 10 MG TABS tablet Take 1 tablet (10 mg total) by mouth daily. 90 tablet 3   Glucagon, rDNA, (GLUCAGON EMERGENCY) 1 MG KIT Inject 1 mg into the skin once for 1 dose. 1 kit 5   HUMALOG KWIKPEN 100 UNIT/ML KwikPen ADMINISTER 20-25 UNITS UNDER THE SKIN THREE TIMES DAILY 45 mL 1   pioglitazone (ACTOS) 30 MG tablet TAKE 1 TABLET(30 MG) BY MOUTH DAILY 90 tablet 3   Semaglutide,0.25 or 0.5MG /DOS, (OZEMPIC, 0.25 OR 0.5 MG/DOSE,) 2 MG/3ML SOPN Inject 0.5 mg into the skin once a week. 9 mL 3   TOUJEO SOLOSTAR 300 UNIT/ML Solostar Pen INJECT 25-30 UNITS  UNDER THE SKIN 2 times a day. 6 mL 4   No current facility-administered medications for this visit.    PHYSICAL EXAM: Vitals:   12/06/22 1001  BP: 106/60  Pulse: 70  Resp: 20  SpO2: 97%  Weight: 194 lb (88 kg)  Height: 6\' 1"  (1.854 m)   Body mass index is 25.6 kg/m.  Wt Readings from Last 3 Encounters:  12/06/22 194 lb  (88 kg)  08/28/22 197 lb (89.4 kg)  08/11/22 196 lb 6.4 oz (89.1 kg)    General: Well developed, well nourished male in no apparent distress.  HEENT: AT/[place], no external lesions.  Eyes: Conjunctiva clear and no icterus. Neck: Neck supple  Lungs: Respirations not labored Neurologic: Alert, oriented, normal speech Extremities / Skin: Dry. No sores or rashes noted.  Psychiatric: Does not appear depressed or anxious  Diabetic Foot Exam - Simple   No data filed    LABS Reviewed Lab Results  Component Value Date   HGBA1C 8.1 (H) 11/29/2022   HGBA1C 7.8 (H) 08/07/2022   HGBA1C 8.0 (H) 05/02/2022   Lab Results  Component Value Date   FRUCTOSAMINE 316 (H) 03/10/2021   FRUCTOSAMINE 367 (H) 02/01/2021   FRUCTOSAMINE 280 09/11/2019   Lab Results  Component Value Date   CHOL 165 01/24/2022   HDL 43 01/24/2022   LDLCALC 95 01/24/2022   TRIG 170 (H) 01/24/2022   CHOLHDL 3.8 01/24/2022   Lab Results  Component Value Date   MICRALBCREAT 0.9 11/29/2022   MICRALBCREAT 0.8 07/27/2022   Lab Results  Component Value Date   CREATININE 1.25 11/29/2022   Lab Results  Component Value Date   GFR 60.61 11/29/2022    ASSESSMENT / PLAN  1. Uncontrolled type 2 diabetes mellitus with hyperglycemia, with long-term current use of insulin (HCC)   2. Type 2 diabetes mellitus with diabetic neuropathy, with long-term current use of insulin (HCC)     Diabetes Mellitus type 2, complicated by diabetic neuropathy. - Diabetic status / severity: Uncontrolled.  Lab Results  Component Value Date   HGBA1C 8.1 (H) 11/29/2022    - Hemoglobin A1c goal : <7%  GMI on CGM 6.9% mostly acceptable blood sugar.  Seems like she has improvement on blood sugar in last 2 weeks compared to last 3 months.  - Medications: No change.  Insulin regimen: Toujeo: 25-30 units bid; Humalog 20 - 25 units before meals          Oral hypoglycemic drugs: Actos 30 mg, Ozempic 0.5 weekly, Farxiga 10mg  daily.  - Home  glucose testing: CGM and check as needed. - Discussed/ Gave Hypoglycemia treatment plan.  # Consult : not required at this time.   # Annual urine for microalbuminuria/ creatinine ratio, no microalbuminuria currently, continue ACE/ARB /olmesartan. Last  Lab Results  Component Value Date   MICRALBCREAT 0.9 11/29/2022    # Foot check nightly / neuropathy, continue gabapentin 300 mg 3 times a day.  Managed by PCP.  # Annual dilated diabetic eye exams.   - Diet: Make healthy diabetic food choices - Life style / activity / exercise: Discussed.  2. Blood pressure  -  BP Readings from Last 1 Encounters:  12/06/22 106/60    - Control is in target.  - No change in current plans.  3. Lipid status / Hyperlipidemia - Last  Lab Results  Component Value Date   LDLCALC 95 01/24/2022   - Continue pitavastatin 1 mg daily.  Managed by primary care provider.  Diagnoses  and all orders for this visit:  Uncontrolled type 2 diabetes mellitus with hyperglycemia, with long-term current use of insulin (HCC) -     Basic metabolic panel -     Hemoglobin A1c -     HUMALOG KWIKPEN 100 UNIT/ML KwikPen; ADMINISTER 20-25 UNITS UNDER THE SKIN THREE TIMES DAILY -     TOUJEO SOLOSTAR 300 UNIT/ML Solostar Pen; INJECT 25-30 UNITS UNDER THE SKIN 2 times a day.  Type 2 diabetes mellitus with diabetic neuropathy, with long-term current use of insulin (HCC) -     pioglitazone (ACTOS) 30 MG tablet; TAKE 1 TABLET(30 MG) BY MOUTH DAILY -     dapagliflozin propanediol (FARXIGA) 10 MG TABS tablet; Take 1 tablet (10 mg total) by mouth daily.  Other orders -     Continuous Glucose Sensor (FREESTYLE LIBRE 3 PLUS SENSOR) MISC; 1 each by Does not apply route continuous. Change every 15 days. -     Semaglutide,0.25 or 0.5MG /DOS, (OZEMPIC, 0.25 OR 0.5 MG/DOSE,) 2 MG/3ML SOPN; Inject 0.5 mg into the skin once a week.    DISPOSITION Follow up in clinic in 3 months suggested.   All questions answered and patient  verbalized understanding of the plan.  [place] Ashvin Adelson, MD [facility] Endocrinology [facility] [address] [place], [address] [place], [place] [address] Phone # [phone]  At least part of this note was generated using voice recognition software. Inadvertent word errors may have occurred, which were not recognized during the proofreading process.

## 2022-12-06 NOTE — Patient Instructions (Signed)
No change on diabetes regimen.

## 2022-12-12 ENCOUNTER — Encounter: Payer: Self-pay | Admitting: Podiatry

## 2022-12-12 ENCOUNTER — Ambulatory Visit (INDEPENDENT_AMBULATORY_CARE_PROVIDER_SITE_OTHER): Payer: 59 | Admitting: Podiatry

## 2022-12-12 DIAGNOSIS — M2041 Other hammer toe(s) (acquired), right foot: Secondary | ICD-10-CM

## 2022-12-12 DIAGNOSIS — M2042 Other hammer toe(s) (acquired), left foot: Secondary | ICD-10-CM

## 2022-12-12 NOTE — Progress Notes (Signed)
  Subjective:  Patient ID: Nicholas Whitehead, male    DOB: 03-Apr-1955,  MRN: [id number]  Chief Complaint  Patient presents with   Routine Post Op    PATIENT STATES HE HAS BEEN OK SINCE LAST VISIT, HIS TOES WAS HURTING HIM REALLY BAD RIGHT NOW , PATIENT TAKES MEDICATION FOR PAIN.    DOS: 09/05/2022 Procedure: Left second toe percutaneous tenotomy  67 y.o. male returns for post-op check.  Still doing fairly well notes no new ulceration  Review of Systems: Negative except as noted in the HPI. Denies N/V/F/Ch.   Objective:  There were no vitals filed for this visit. There is no height or weight on file to calculate BMI. Constitutional Well developed. Well nourished.  Vascular Foot warm and well perfused. Capillary refill normal to all digits.  Calf is soft and supple, no posterior calf or knee pain, negative Homans' sign  Neurologic Normal speech. Oriented to person, place, and time. Epicritic sensation to light touch grossly present bilaterally.  Dermatologic Some residual callus no residual ulceration on any toes  Orthopedic: Mild tenderness to palpation.  Semireducible mallet and hammertoe deformities    Assessment:   1. Hammer toe of left foot   2. Hammertoe of right foot    Plan:  Patient was evaluated and treated and all questions answered.  S/p foot surgery left -Doing well surgery is scheduled for December 20 for tenotomy's of the remaining toes.  Silicone toe crest dispensed he will utilize these until surgery and will let me know if any risk of ulceration develops.,  He will follow-up with me after surgery.  No follow-ups on file.

## 2022-12-13 ENCOUNTER — Other Ambulatory Visit: Payer: Self-pay | Admitting: Internal Medicine

## 2022-12-13 DIAGNOSIS — J410 Simple chronic bronchitis: Secondary | ICD-10-CM

## 2022-12-26 ENCOUNTER — Ambulatory Visit (INDEPENDENT_AMBULATORY_CARE_PROVIDER_SITE_OTHER): Payer: 59 | Admitting: Podiatry

## 2022-12-26 ENCOUNTER — Encounter: Payer: Self-pay | Admitting: Podiatry

## 2022-12-26 DIAGNOSIS — Z794 Long term (current) use of insulin: Secondary | ICD-10-CM

## 2022-12-26 DIAGNOSIS — L84 Corns and callosities: Secondary | ICD-10-CM | POA: Diagnosis not present

## 2022-12-26 DIAGNOSIS — M79675 Pain in left toe(s): Secondary | ICD-10-CM

## 2022-12-26 DIAGNOSIS — B351 Tinea unguium: Secondary | ICD-10-CM | POA: Diagnosis not present

## 2022-12-26 DIAGNOSIS — Z89419 Acquired absence of unspecified great toe: Secondary | ICD-10-CM

## 2022-12-26 DIAGNOSIS — E1140 Type 2 diabetes mellitus with diabetic neuropathy, unspecified: Secondary | ICD-10-CM | POA: Diagnosis not present

## 2022-12-26 DIAGNOSIS — M79674 Pain in right toe(s): Secondary | ICD-10-CM | POA: Diagnosis not present

## 2022-12-29 ENCOUNTER — Other Ambulatory Visit: Payer: Self-pay | Admitting: Podiatry

## 2022-12-29 DIAGNOSIS — M2042 Other hammer toe(s) (acquired), left foot: Secondary | ICD-10-CM | POA: Diagnosis not present

## 2022-12-29 DIAGNOSIS — M2062 Acquired deformities of toe(s), unspecified, left foot: Secondary | ICD-10-CM | POA: Diagnosis not present

## 2022-12-29 DIAGNOSIS — M2041 Other hammer toe(s) (acquired), right foot: Secondary | ICD-10-CM | POA: Diagnosis not present

## 2022-12-29 HISTORY — PX: OTHER SURGICAL HISTORY: SHX169

## 2022-12-29 MED ORDER — CEPHALEXIN 500 MG PO CAPS: 500.0000 mg | ORAL_CAPSULE | Freq: Three times a day (TID) | ORAL | 0 refills | Status: AC

## 2022-12-29 NOTE — Progress Notes (Signed)
12/20 flexor tenotomies

## 2022-12-31 NOTE — Progress Notes (Signed)
  Subjective:  Patient ID: Nicholas Whitehead, male    DOB: 12-26-1955,  MRN: [id number]  Nicholas Whitehead presents to clinic today for at risk foot care. Patient has h/o NIDDM, neuropathy with amputation of partial amputation of L hallux. He has h/o ulceration left 2nd toe. Will be having flexor tenotomy with Dr. Lilian Kapur this week. Chief Complaint  Patient presents with   Diabetes    PATIENT STATES THE LAST TIME HE SAW HIS PCP WAS IN OCTOBER , PATIENT A1C WAS 7.8   New problem(s): None.   PCP is [name], MD.  Allergies  Allergen Reactions   Benazepril Cough   Molds & Smuts     Hard to breathe   Pravastatin Other (See Comments)    myositis   Glipizide Nausea Only    Review of Systems: Negative except as noted in the HPI.  Objective: No changes noted in today's physical examination. There were no vitals filed for this visit. Nicholas Whitehead is a pleasant 67 y.o. male WD, WN in NAD. AAO x 3.  Vascular Examination: CFT <4 seconds b/l LE. Palpable PT pulse(s) b/l LE. Faintly palpable DP pulses b/l LE. Pedal hair absent. No pain with calf compression b/l. No edema noted b/l LE. No ischemia or gangrene noted b/l LE. No cyanosis or clubbing noted b/l LE.  Dermatological Examination: No interdigital macerations noted b/l LE.   Toenails 1-5 right foot and 2-5 left foot well maintained with adequate length. No erythema, no edema, no drainage, no fluctuance.   Preulcerative lesion noted distal tip left 2nd digit and bilateral heels. There is visible subdermal hemorrhage. There is no surrounding erythema, no edema, no drainage, no odor, no fluctuance.  Musculoskeletal Examination: Muscle strength 5/5 to all lower extremity muscle groups bilaterally. Severe hammertoe deformity noted 2-5 bilaterally. Lower extremity amputation(s): partial amputation of left great toe.   Neurological Examination:  Pt has subjective symptoms of neuropathy. Protective sensation diminished with 10g  monofilament b/l.  Assessment/Plan: 1. Pain due to onychomycosis of toenails of both feet   2. Pre-ulcerative corn or callous   3. History of amputation of great toe (HCC)   4. Type 2 diabetes mellitus with diabetic neuropathy, with long-term current use of insulin (HCC)    -Patient was evaluated today. All questions/concerns addressed on today's visit. -Continue foot and shoe inspections daily. Monitor blood glucose per PCP/Endocrinologist's recommendations. -Toenails were debrided in length and girth 1-5 right foot and 2-5 left foot with sterile nail nippers and dremel without iatrogenic bleeding.  -Preulcerative lesion pared bilateral heels and L 2nd toe utilizing sterile scalpel blade. Total number pared=3. -Patient/POA to call should there be question/concern in the interim.   Return in about 3 months (around 03/26/2023).  Nicholas Whitehead, DPM      [place] LOCATION: [address], [place] [address]                   Office [phone]   [facility] LOCATION: [address] [place], [place] [address] Office [phone]

## 2023-01-01 ENCOUNTER — Other Ambulatory Visit: Payer: Self-pay | Admitting: Internal Medicine

## 2023-01-01 ENCOUNTER — Other Ambulatory Visit: Payer: Self-pay

## 2023-01-01 DIAGNOSIS — E1165 Type 2 diabetes mellitus with hyperglycemia: Secondary | ICD-10-CM

## 2023-01-01 DIAGNOSIS — B20 Human immunodeficiency virus [HIV] disease: Secondary | ICD-10-CM

## 2023-01-01 DIAGNOSIS — F409 Phobic anxiety disorder, unspecified: Secondary | ICD-10-CM

## 2023-01-01 DIAGNOSIS — E1140 Type 2 diabetes mellitus with diabetic neuropathy, unspecified: Secondary | ICD-10-CM

## 2023-01-01 DIAGNOSIS — F32A Depression, unspecified: Secondary | ICD-10-CM

## 2023-01-01 DIAGNOSIS — F330 Major depressive disorder, recurrent, mild: Secondary | ICD-10-CM

## 2023-01-01 MED ORDER — TOUJEO SOLOSTAR 300 UNIT/ML ~~LOC~~ SOPN: PEN_INJECTOR | SUBCUTANEOUS | 4 refills | Status: DC

## 2023-01-01 MED ORDER — OZEMPIC (0.25 OR 0.5 MG/DOSE) 2 MG/3ML ~~LOC~~ SOPN: 0.5000 mg | PEN_INJECTOR | SUBCUTANEOUS | 3 refills | Status: DC

## 2023-01-05 ENCOUNTER — Ambulatory Visit (INDEPENDENT_AMBULATORY_CARE_PROVIDER_SITE_OTHER): Payer: 59

## 2023-01-05 ENCOUNTER — Ambulatory Visit (INDEPENDENT_AMBULATORY_CARE_PROVIDER_SITE_OTHER): Payer: 59 | Admitting: Podiatry

## 2023-01-05 DIAGNOSIS — Z9889 Other specified postprocedural states: Secondary | ICD-10-CM

## 2023-01-05 DIAGNOSIS — M2042 Other hammer toe(s) (acquired), left foot: Secondary | ICD-10-CM

## 2023-01-05 DIAGNOSIS — M2041 Other hammer toe(s) (acquired), right foot: Secondary | ICD-10-CM

## 2023-01-05 NOTE — Progress Notes (Unsigned)
Subjective:  Procedure: Flexor tenotomies Right 2-5, Left 3-5  A1c: 8.1 on 11/29/2022  Denies any systemic complaints such as fevers, chills, nausea, vomiting. No acute changes since last appointment, and no other complaints at this time.   Objective: AAO x3, NAD DP/PT pulses palpable bilaterally, CRT less than 3 seconds  No pain with calf compression, swelling, warmth, erythema  Assessment:  Plan: -All treatment options discussed with the patient including all alternatives, risks, complications.  -X-rays were obtained and reviewed with the patient.  -Patient encouraged to call the office with any questions, concerns, change in symptoms.

## 2023-01-08 ENCOUNTER — Other Ambulatory Visit: Payer: Self-pay

## 2023-01-08 DIAGNOSIS — E1165 Type 2 diabetes mellitus with hyperglycemia: Secondary | ICD-10-CM

## 2023-01-08 MED ORDER — OZEMPIC (0.25 OR 0.5 MG/DOSE) 2 MG/3ML ~~LOC~~ SOPN: 0.5000 mg | PEN_INJECTOR | SUBCUTANEOUS | 3 refills | Status: DC

## 2023-01-08 MED ORDER — TOUJEO SOLOSTAR 300 UNIT/ML ~~LOC~~ SOPN: PEN_INJECTOR | SUBCUTANEOUS | 4 refills | Status: DC

## 2023-01-11 ENCOUNTER — Ambulatory Visit (INDEPENDENT_AMBULATORY_CARE_PROVIDER_SITE_OTHER): Payer: 59 | Admitting: Podiatry

## 2023-01-11 ENCOUNTER — Encounter: Payer: Self-pay | Admitting: Podiatry

## 2023-01-11 DIAGNOSIS — M2042 Other hammer toe(s) (acquired), left foot: Secondary | ICD-10-CM

## 2023-01-11 DIAGNOSIS — M2041 Other hammer toe(s) (acquired), right foot: Secondary | ICD-10-CM

## 2023-01-11 NOTE — Progress Notes (Signed)
  Subjective:  Patient ID: Nicholas Whitehead, male    DOB: 07/04/1955,  MRN: [id number]  Chief Complaint  Patient presents with   Routine Post Op    POV # 2 DOS 12/29/22 --- TENDON RELEASE LEFT TOES 3-5, RIGHT TOES 2-5 They're doing good.  I put on my shoes.     68 y.o. male returns for post-op check. Pain minimal  Review of Systems: Negative except as noted in the HPI. Denies N/V/F/Ch.   Objective:  There were no vitals filed for this visit. There is no height or weight on file to calculate BMI. Constitutional Well developed. Well nourished.  Vascular Foot warm and well perfused. Capillary refill normal to all digits.  Calf is soft and supple, no posterior calf or knee pain, negative Homans' sign  Neurologic Normal speech. Oriented to person, place, and time. Epicritic sensation to light touch grossly absent bilaterally.  Dermatologic Skin healing well without signs of infection. Skin edges well coapted without signs of infection. Small scab still L 3rd and R 4th  Orthopedic: No pain to palpation noted about the surgical site.   Multiple view plain film radiographs: radiographs 1 week ago show improved alignment Assessment:   1. Hammertoe of right foot   2. Hammer toe of left foot    Plan:  Patient was evaluated and treated and all questions answered.  S/p foot surgery bilaterally -Progressing as expected post-operatively. -Toe position much improved.  Still has persistent scab on the left third and right fourth toes continue Neosporin and Band-Aid to the sites remainder can leave open to air continue regular shoes and activity return in about 4 weeks to reevaluate for final follow-up  Return in about 4 weeks (around 02/08/2023) for post op (no x-rays).

## 2023-01-15 ENCOUNTER — Other Ambulatory Visit: Payer: Self-pay

## 2023-01-15 ENCOUNTER — Telehealth: Payer: Self-pay

## 2023-01-15 ENCOUNTER — Other Ambulatory Visit: Payer: Self-pay | Admitting: Internal Medicine

## 2023-01-15 DIAGNOSIS — F409 Phobic anxiety disorder, unspecified: Secondary | ICD-10-CM

## 2023-01-15 MED ORDER — BELSOMRA 20 MG PO TABS: 1.0000 | ORAL_TABLET | Freq: Every evening | ORAL | 0 refills | Status: DC | PRN

## 2023-01-15 NOTE — Telephone Encounter (Signed)
 Copied from CRM [id number]. Topic: Clinical - Medication Refill >> Jan 15, 2023 10:54 AM Rolin D wrote: Most Recent Primary Care Visit:  Provider: GALA GALLEY  Department: [facility]  Visit Type: NURSE VISIT  Date: 10/24/2022  Medication: BELSOMRA  20 MG TABS  Has the patient contacted their pharmacy? No (Agent: If no, request that the patient contact the pharmacy for the refill. If patient does not wish to contact the pharmacy document the reason why and proceed with request.) (Agent: If yes, when and what did the pharmacy advise?)  Is this the correct pharmacy for this prescription? Yes If no, delete pharmacy and type the correct one.  This is the patient's preferred pharmacy:    [id number] - [place], [place] - [address] AT [facility] OF [address] [place] [address] Phone: [phone] Fax: [phone]   Has the prescription been filled recently? No  Is the patient out of the medication? Yes  Has the patient been seen for an appointment in the last year OR does the patient have an upcoming appointment? Yes  Can we respond through MyChart? No  Agent: Please be advised that Rx refills may take up to 3 business days. We ask that you follow-up with your pharmacy.

## 2023-01-15 NOTE — Telephone Encounter (Signed)
 Medication has been refilled.

## 2023-01-17 ENCOUNTER — Other Ambulatory Visit: Payer: Self-pay

## 2023-01-17 DIAGNOSIS — E1140 Type 2 diabetes mellitus with diabetic neuropathy, unspecified: Secondary | ICD-10-CM

## 2023-01-17 DIAGNOSIS — E1165 Type 2 diabetes mellitus with hyperglycemia: Secondary | ICD-10-CM

## 2023-01-17 MED ORDER — PIOGLITAZONE HCL 30 MG PO TABS: ORAL_TABLET | ORAL | 3 refills | Status: DC

## 2023-01-17 MED ORDER — HUMALOG KWIKPEN 100 UNIT/ML ~~LOC~~ SOPN: PEN_INJECTOR | SUBCUTANEOUS | 1 refills | Status: DC

## 2023-01-18 ENCOUNTER — Encounter: Payer: 59 | Admitting: Podiatry

## 2023-01-20 ENCOUNTER — Other Ambulatory Visit: Payer: Self-pay | Admitting: Internal Medicine

## 2023-01-20 DIAGNOSIS — E785 Hyperlipidemia, unspecified: Secondary | ICD-10-CM

## 2023-01-22 ENCOUNTER — Other Ambulatory Visit: Payer: Self-pay | Admitting: Internal Medicine

## 2023-01-22 DIAGNOSIS — B20 Human immunodeficiency virus [HIV] disease: Secondary | ICD-10-CM

## 2023-01-24 ENCOUNTER — Other Ambulatory Visit: Payer: Self-pay

## 2023-01-29 ENCOUNTER — Ambulatory Visit: Payer: 59 | Admitting: Internal Medicine

## 2023-02-05 ENCOUNTER — Ambulatory Visit (INDEPENDENT_AMBULATORY_CARE_PROVIDER_SITE_OTHER): Payer: 59

## 2023-02-05 VITALS — Ht 73.0 in | Wt 194.0 lb

## 2023-02-05 DIAGNOSIS — Z0000 Encounter for general adult medical examination without abnormal findings: Secondary | ICD-10-CM | POA: Diagnosis not present

## 2023-02-05 NOTE — Progress Notes (Signed)
Subjective:   Nicholas Whitehead is a 68 y.o. male who presents for Medicare Annual/Subsequent preventive examination.  Visit Complete: Virtual I connected with  [name] on 02/05/23 by a audio enabled telemedicine application and verified that I am speaking with the correct person using two identifiers.  Patient Location: Home  Provider Location: Office/Clinic  I discussed the limitations of evaluation and management by telemedicine. The patient expressed understanding and agreed to proceed.  Vital Signs: Because this visit was a virtual/telehealth visit, some criteria may be missing or patient reported. Any vitals not documented were not able to be obtained and vitals that have been documented are patient reported.   Cardiac Risk Factors include: advanced age (>42men, >29 women);hypertension;diabetes mellitus;Other (see comment);dyslipidemia, Risk factor comments: BPH, PAD, CRD     Objective:    Today's Vitals   02/05/23 0833  Weight: 194 lb (88 kg)  Height: 6\' 1"  (1.854 m)   Body mass index is 25.6 kg/m.     02/05/2023    9:05 AM 01/31/2022    9:29 AM 01/21/2021    3:35 PM 10/27/2019   12:53 PM 10/25/2018    1:11 PM 03/14/2018   12:07 PM 02/25/2018    3:52 PM  Advanced Directives  Does Patient Have a Medical Advance Directive? Yes No;Yes Yes No Yes No No  Type of Estate agent of [place];Living will Healthcare Power of [place];Living will Living will;Healthcare Power of [employer] Power of [place];Living will    Does patient want to make changes to medical advance directive?   No - Patient declined      Copy of Healthcare Power of Attorney in Chart? No - copy requested  No - copy requested  No - copy requested    Would patient like information on creating a medical advance directive?  No - Patient declined  No - Patient declined  No - Patient declined No - Patient declined    Current Medications (verified) Outpatient Encounter  Medications as of 02/05/2023  Medication Sig   ACCU-CHEK GUIDE test strip 1 each by Other route 4 (four) times daily. TEST BLOOD SUGAR FOUR TIMES DAILY   albuterol (VENTOLIN HFA) 108 (90 Base) MCG/ACT inhaler INHALE TWO PUFFS BY MOUTH INTO LUNGS EVERY 6 HOURS AS NEEDED FOR WHEEZING OR SHORTNESS OF BREATH   Alcohol Swabs (B-D SINGLE USE SWABS REGULAR) PADS USE AS DIRECTED   aspirin EC 81 MG tablet Take 81 mg by mouth every morning.   Blood Glucose Monitoring Suppl (ACCU-CHEK GUIDE) w/Device KIT Use to check blood sugars four times a day   cetirizine (ZYRTEC) 10 MG tablet Take 10 mg by mouth daily.   Cholecalciferol 2000 units TABS Take 1 tablet (2,000 Units total) by mouth daily.   Continuous Glucose Sensor (FREESTYLE LIBRE 3 PLUS SENSOR) MISC Apply topically every 14 (fourteen) days.   Continuous Glucose Sensor (FREESTYLE LIBRE 3 PLUS SENSOR) MISC 1 each by Does not apply route continuous. Change every 15 days.   dapagliflozin propanediol (FARXIGA) 10 MG TABS tablet Take 1 tablet (10 mg total) by mouth daily.   DULoxetine (CYMBALTA) 60 MG capsule TAKE ONE CAPSULE (60MG ) BY MOUTH DAILY AT 9AM   gabapentin (NEURONTIN) 300 MG capsule TAKE ONE CAPSULE BY MOUTH THREE TIMES DAILY @ 9AM-3PM-9PM   Glucagon (GVOKE HYPOPEN 2-PACK) 1 MG/0.2ML SOAJ Inject 1 Act into the skin daily as needed.   Glycopyrrolate-Formoterol (BEVESPI AEROSPHERE) 9-4.8 MCG/ACT AERO Inhale 2 puffs into the lungs 2 (two) times daily.  HUMALOG KWIKPEN 100 UNIT/ML KwikPen ADMINISTER 20-25 UNITS UNDER THE SKIN THREE TIMES DAILY   Insulin Pen Needle (B-D ULTRAFINE III SHORT PEN) 31G X 8 MM MISC USE TO INJECT INSULIN AS DIRECTED THREE TIMES DAILY PER HUMALOG   Lancets MISC Inject 1 Act into the skin 4 (four) times daily. Use to test blood sugar tid. DX: E11.8   mirtazapine (REMERON) 7.5 MG tablet TAKE ONE TABLET BY MOUTH DAILY AT 9PM   mupirocin ointment (BACTROBAN) 2 % Apply 1 Application topically 2 (two) times daily.   Nebivolol HCl  20 MG TABS TAKE 1 TABLET(20 MG) BY MOUTH DAILY   ODEFSEY 200-25-25 MG TABS tablet TAKE ONE TABLET BY MOUTH DAILY   olmesartan (BENICAR) 40 MG tablet TAKE ONE TABLET (40MG ) BY MOUTH DAILY   pioglitazone (ACTOS) 30 MG tablet TAKE 1 TABLET(30 MG) BY MOUTH DAILY   Pitavastatin Calcium (LIVALO) 1 MG TABS TAKE 1 TABLET(1 MG) BY MOUTH DAILY   Semaglutide,0.25 or 0.5MG /DOS, (OZEMPIC, 0.25 OR 0.5 MG/DOSE,) 2 MG/3ML SOPN Inject 0.5 mg into the skin once a week.   Suvorexant (BELSOMRA) 20 MG TABS Take 1 tablet (20 mg total) by mouth at bedtime as needed.   torsemide (DEMADEX) 20 MG tablet TAKE ONE TABLET BY MOUTH ON MONDAY, WEDNESDAY AND FRIDAY   TOUJEO SOLOSTAR 300 UNIT/ML Solostar Pen INJECT 25-30 UNITS UNDER THE SKIN 2 times a day.   traZODone (DESYREL) 150 MG tablet TAKE THREE TABLETS BY MOUTH DAILY AT 9PM   Glucagon, rDNA, (GLUCAGON EMERGENCY) 1 MG KIT Inject 1 mg into the skin once for 1 dose.   No facility-administered encounter medications on file as of 02/05/2023.    Allergies (verified) Benazepril, Molds & smuts, Pravastatin, and Glipizide   History: Past Medical History:  Diagnosis Date   Anxiety    Cataract    right    Depression    Diabetes mellitus without complication (HCC)    DM (diabetes mellitus screen)    HIV disease (HCC) 09/12/1995   HIV infection (HCC)    Hypercholesterolemia    Hyperlipidemia    Hypertension    Past Surgical History:  Procedure Laterality Date   COLONOSCOPY WITH PROPOFOL  01/16/2012   Procedure: COLONOSCOPY WITH PROPOFOL;  Surgeon: Charolett Bumpers, MD;  Location: [facility];  Service: Endoscopy;  Laterality: N/A;   EYE SURGERY  01/10/2003   catarct surgery   foot sugery Bilateral 12/29/2022   optic lens surgery  01/10/2003   Family History  Problem Relation Age of Onset   Arthritis Mother    Diabetes Mother    Heart disease Mother    Stroke Mother    Arthritis Father    Diabetes Father    Heart disease Father    Social History    Socioeconomic History   Marital status: Single    Spouse name: Not on file   Number of children: Not on file   Years of education: Not on file   Highest education level: Not on file  Occupational History   Occupation: disability  Tobacco Use   Smoking status: Every Day    Current packs/day: 0.25    Types: Cigarettes   Smokeless tobacco: Never   Tobacco comments:    Occ smoking/ trying to quit  Vaping Use   Vaping status: Never Used  Substance and Sexual Activity   Alcohol use: No    Alcohol/week: 0.0 standard drinks of alcohol    Comment: socially   Drug use: No   Sexual activity:  Never    Birth control/protection: Other-see comments    Comment: declined condoms  Other Topics Concern   Not on file  Social History Narrative   Lives alone   Social Drivers of Health   Financial Resource Strain: Medium Risk (02/05/2023)   Overall Financial Resource Strain (CARDIA)    Difficulty of Paying Living Expenses: Somewhat hard  Food Insecurity: Food Insecurity Present (02/05/2023)   Hunger Vital Sign    Worried About Running Out of Food in the Last Year: Sometimes true    Ran Out of Food in the Last Year: Sometimes true  Transportation Needs: No Transportation Needs (02/05/2023)   PRAPARE - Administrator, Civil Service (Medical): No    Lack of Transportation (Non-Medical): No  Physical Activity: Inactive (02/05/2023)   Exercise Vital Sign    Days of Exercise per Week: 0 days    Minutes of Exercise per Session: 0 min  Stress: No Stress Concern Present (02/05/2023)   [employer] of Occupational Health - Occupational Stress Questionnaire    Feeling of Stress : Only a little  Social Connections: Unknown (02/05/2023)   Social Connection and Isolation Panel [NHANES]    Frequency of Communication with Friends and Family: More than three times a week    Frequency of Social Gatherings with Friends and Family: Never    Attends Religious Services: Not on Administrator, sports or Organizations: Not on file    Attends Banker Meetings: Not on file    Marital Status: Never married    Tobacco Counseling Ready to quit: Not Answered Counseling given: Not Answered Tobacco comments: Occ smoking/ trying to quit   Clinical Intake:  Pre-visit preparation completed: Yes  Pain : No/denies pain     BMI - recorded: 25.6 Nutritional Status: BMI 25 -29 Overweight Nutritional Risks: None Diabetes: Yes CBG done?: Yes (126) Did pt. bring in CBG monitor from home?: No  How often do you need to have someone help you when you read instructions, pamphlets, or other written materials from your doctor or pharmacy?: 1 - Never     Information entered by :: Taiesha Bovard, RMA   Activities of Daily Living    02/05/2023    8:33 AM  In your present state of health, do you have any difficulty performing the following activities:  Hearing? 1  Comment starting to notice some difficulty  Difficulty concentrating or making decisions? 0  Walking or climbing stairs? 0  Dressing or bathing? 0  Doing errands, shopping? 0  Comment cab services  Preparing Food and eating ? N  Using the Toilet? N  In the past six months, have you accidently leaked urine? N  Do you have problems with loss of bowel control? N  Managing your Medications? N  Managing your Finances? N  Housekeeping or managing your Housekeeping? N    Patient Care Team: [name], MD as PCP - General (Internal Medicine) [name], Belia Heman, MD as PCP - Infectious Diseases (Infectious Diseases) Roderic Scarce, LCSW as Counselor Nelson Chimes, MD as Consulting Physician (Ophthalmology) Erlene Quan, Vinnie Level, [facility] (Inactive) (Pharmacist)  Indicate any recent Medical Services you may have received from other than [facility] providers in the past year (date may be approximate).     Assessment:   This is a routine wellness examination for [place].  Hearing/Vision screen Hearing Screening -  Comments:: Denies hearing difficulties   Vision Screening - Comments:: Wears eyeglasses   Goals  Addressed               This Visit's Progress     Patient Stated (pt-stated)   On track     Maintain my health the very best I can.      Depression Screen    02/05/2023    9:12 AM 08/28/2022   10:45 AM 04/18/2022    2:23 PM 03/09/2022   10:53 AM 02/07/2022    8:32 AM 01/31/2022    9:25 AM 01/21/2021    3:37 PM  PHQ 2/9 Scores  PHQ - 2 Score 4 1  0  0 4  PHQ- 9 Score 6      14  Exception Documentation Other- indicate reason in comment box  Patient refusal  Other- indicate reason in comment box    Not completed Brother just passed/he was very close to him    patient states he has been dealing with depression and the Cymbalta helps, offered to connect him with counseling. he declines at this time      Fall Risk    02/05/2023    9:06 AM 08/28/2022   10:46 AM 03/09/2022   10:53 AM 02/07/2022    8:32 AM 01/31/2022    9:28 AM  Fall Risk   Falls in the past year? 0 0 0 0 0  Number falls in past yr: 0  0 0 0  Injury with Fall? 0  0 0 0  Risk for fall due to : No Fall Risks  No Fall Risks Impaired balance/gait;Impaired mobility No Fall Risks  Follow up Falls prevention discussed;Falls evaluation completed  Falls evaluation completed Falls evaluation completed Falls prevention discussed    MEDICARE RISK AT HOME: Medicare Risk at Home Any stairs in or around the home?: No Home free of loose throw rugs in walkways, pet beds, electrical cords, etc?: Yes Adequate lighting in your home to reduce risk of falls?: Yes Life alert?: Yes Use of a cane, walker or w/c?: Yes Grab bars in the bathroom?: Yes Shower chair or bench in shower?: Yes Elevated toilet seat or a handicapped toilet?: Yes  TIMED UP AND GO:  Was the test performed?  No    Cognitive Function:    10/25/2018    1:27 PM  MMSE - Mini Mental State Exam  Not completed: Refused      06/28/2015    3:00 PM  Montreal Cognitive  Assessment   Visuospatial/ Executive (0/5) 3  Naming (0/3) 3  Attention: Read list of digits (0/2) 2  Attention: Read list of letters (0/1) 1  Attention: Serial 7 subtraction starting at 100 (0/3) 0  Language: Repeat phrase (0/2) 1  Language : Fluency (0/1) 1  Abstraction (0/2) 2  Delayed Recall (0/5) 3  Orientation (0/6) 6  Total 22  Adjusted Score (based on education) 22      02/05/2023    8:35 AM 01/31/2022    9:29 AM  6CIT Screen  What Year? 0 points 0 points  What month? 0 points 0 points  What time? 0 points 0 points  Count back from 20 0 points 0 points  Months in reverse 0 points 0 points  Repeat phrase 0 points 0 points  Total Score 0 points 0 points    Immunizations Immunization History  Administered Date(s) Administered   Hepatitis A 01/29/2002   Influenza Split 09/26/2011   Influenza Whole 10/14/2001, 09/02/2010   Influenza, High Dose Seasonal PF 09/06/2015   Influenza, Seasonal, Injecte, Preservative Fre  10/24/2022   Influenza,inj,Quad PF,6+ Mos 09/19/2012, 09/11/2013, 09/22/2014, 03/02/2015, 10/24/2016, 10/23/2017, 09/25/2018, 12/24/2019, 12/20/2020, 09/22/2021   PFIZER(Purple Top)SARS-COV-2 Vaccination 09/30/2019, 10/21/2019   PPD Test 09/14/1995   Pneumococcal Conjugate-13 01/21/2014   Pneumococcal Polysaccharide-23 12/16/2001, 09/02/2010, 09/06/2015, 03/22/2021   Tdap 05/21/2013    TDAP status: Due, Education has been provided regarding the importance of this vaccine. Advised may receive this vaccine at local pharmacy or Health Dept. Aware to provide a copy of the vaccination record if obtained from local pharmacy or Health Dept. Verbalized acceptance and understanding.  Flu Vaccine status: Up to date  Pneumococcal vaccine status: Up to date  Covid-19 vaccine status: Declined, Education has been provided regarding the importance of this vaccine but patient still declined. Advised may receive this vaccine at local pharmacy or Health Dept.or vaccine  clinic. Aware to provide a copy of the vaccination record if obtained from local pharmacy or Health Dept. Verbalized acceptance and understanding.  Qualifies for Shingles Vaccine? Yes   Zostavax completed No   Shingrix Completed?: No.    Education has been provided regarding the importance of this vaccine. Patient has been advised to call insurance company to determine out of pocket expense if they have not yet received this vaccine. Advised may also receive vaccine at local pharmacy or Health Dept. Verbalized acceptance and understanding.  Screening Tests Health Maintenance  Topic Date Due   Zoster Vaccines- Shingrix (1 of 2) Never done   Colonoscopy  04/25/2022   FOOT EXAM  03/09/2023   DTaP/Tdap/Td (2 - Td or Tdap) 05/22/2023   HEMOGLOBIN A1C  05/29/2023   OPHTHALMOLOGY EXAM  08/30/2023   Diabetic kidney evaluation - eGFR measurement  11/29/2023   Diabetic kidney evaluation - Urine ACR  11/29/2023   Medicare Annual Wellness (AWV)  02/05/2024   Pneumonia Vaccine 49+ Years old (4 of 4 - PPSV23 or PCV20) 03/23/2026   INFLUENZA VACCINE  Completed   Hepatitis C Screening  Completed   HIV Screening  Completed   HPV VACCINES  Aged Out   COVID-19 Vaccine  Discontinued    Health Maintenance  Health Maintenance Due  Topic Date Due   Zoster Vaccines- Shingrix (1 of 2) Never done   Colonoscopy  04/25/2022    Colorectal cancer screening: Type of screening: Colonoscopy. Completed 04/25/2022. Repeat every 10 years  DUE  Lung Cancer Screening: (Low Dose CT Chest recommended if Age 77-80 years, 20 pack-year currently smoking OR have quit w/in 15years.) does not qualify.   Lung Cancer Screening Referral: N/A  Additional Screening:  Hepatitis C Screening: does qualify; Completed 08/18/2010  Vision Screening: Recommended annual ophthalmology exams for early detection of glaucoma and other disorders of the eye. Is the patient up to date with their annual eye exam?  Yes  Who is the provider  or what is the name of the office in which the patient attends annual eye exams? [facility] If pt is not established with a provider, would they like to be referred to a provider to establish care? No .   Dental Screening: Recommended annual dental exams for proper oral hygiene  Diabetic Foot Exam: Diabetic Foot Exam: Completed 03/20/2022  Community Resource Referral / Chronic Care Management: CRR required this visit?  No   CCM required this visit?  No     Plan:     I have personally reviewed and noted the following in the patient's chart:   Medical and social history Use of alcohol, tobacco or illicit drugs  Current medications and  supplements including opioid prescriptions. Patient is not currently taking opioid prescriptions. Functional ability and status Nutritional status Physical activity Advanced directives List of other physicians Hospitalizations, surgeries, and ER visits in previous 12 months Vitals Screenings to include cognitive, depression, and falls Referrals and appointments  In addition, I have reviewed and discussed with patient certain preventive protocols, quality metrics, and best practice recommendations. A written personalized care plan for preventive services as well as general preventive health recommendations were provided to patient.     Jennife Zaucha L Legrand Lasser, CMA   02/05/2023   After Visit Summary: (Mail) Due to this being a telephonic visit, the after visit summary with patients personalized plan was offered to patient via mail   Nurse Notes: Patient is due for a Shingles vaccine and soon for a Tdap.  Patient is also due for a colonoscopy, however he would like to discuss this with Dr. Yetta Barre during his next office visit.  He had no concerns to address today.

## 2023-02-05 NOTE — Patient Instructions (Signed)
Nicholas Whitehead , Thank you for taking time to come for your Medicare Wellness Visit. I appreciate your ongoing commitment to your health goals. Please review the following plan we discussed and let me know if I can assist you in the future.   Referrals/Orders/Follow-Ups/Clinician Recommendations: It was nice to speak with you today.  You are due for a Shingles vaccine and a colonoscopy.  Please discuss option about colon screenings during your next visit with Dr. Yetta Barre.  Each day, aim for 6 glasses of water, plenty of protein in your diet and try to get up and walk/ stretch every hour for 5-10 minutes at a time.  Get well soon.    This is a list of the screening recommended for you and due dates:  Health Maintenance  Topic Date Due   Zoster (Shingles) Vaccine (1 of 2) Never done   Colon Cancer Screening  04/25/2022   Medicare Annual Wellness Visit  02/01/2023   Complete foot exam   03/09/2023   DTaP/Tdap/Td vaccine (2 - Td or Tdap) 05/22/2023   Hemoglobin A1C  05/29/2023   Eye exam for diabetics  08/30/2023   Yearly kidney function blood test for diabetes  11/29/2023   Yearly kidney health urinalysis for diabetes  11/29/2023   Pneumonia Vaccine (4 of 4 - PPSV23 or PCV20) 03/23/2026   Flu Shot  Completed   Hepatitis C Screening  Completed   HIV Screening  Completed   HPV Vaccine  Aged Out   COVID-19 Vaccine  Discontinued    Advanced directives: (Copy Requested) Please bring a copy of your health care power of attorney and living will to the office to be added to your chart at your convenience.  Next Medicare Annual Wellness Visit scheduled for next year: Yes

## 2023-02-08 ENCOUNTER — Encounter: Payer: Self-pay | Admitting: Podiatry

## 2023-02-08 ENCOUNTER — Ambulatory Visit (INDEPENDENT_AMBULATORY_CARE_PROVIDER_SITE_OTHER): Payer: 59 | Admitting: Podiatry

## 2023-02-08 VITALS — Ht 73.0 in

## 2023-02-08 DIAGNOSIS — M2042 Other hammer toe(s) (acquired), left foot: Secondary | ICD-10-CM

## 2023-02-08 DIAGNOSIS — M2041 Other hammer toe(s) (acquired), right foot: Secondary | ICD-10-CM

## 2023-02-08 NOTE — Progress Notes (Signed)
  Subjective:  Patient ID: Nicholas Whitehead, male    DOB: May 16, 1955,  MRN: [id number]  Chief Complaint  Patient presents with   Routine Post Op    POV # 3 DOS 12/29/22 --- TENDON RELEASE LEFT TOES 3-5, RIGHT TOES 2-5, " this is all new to me"    68 y.o. male returns for post-op check. Pain minimal  Review of Systems: Negative except as noted in the HPI. Denies N/V/F/Ch.   Objective:  There were no vitals filed for this visit. Body mass index is 25.6 kg/m. Constitutional Well developed. Well nourished.  Vascular Foot warm and well perfused. Capillary refill normal to all digits.  Calf is soft and supple, no posterior calf or knee pain, negative Homans' sign  Neurologic Normal speech. Oriented to person, place, and time. Epicritic sensation to light touch grossly absent bilaterally.  Dermatologic All incisions Have healed  Orthopedic: No pain to palpation noted about the surgical site.   Multiple view plain film radiographs: radiographs 1 week ago show improved alignment Assessment:   1. Hammertoe of right foot   2. Hammer toe of left foot    Plan:  Patient was evaluated and treated and all questions answered.  S/p foot surgery bilaterally -Doing very well.  No further treatment necessary at this point.  He may continue regular shoe gear and activity, and may follow-up with me as needed if there are further issues  No follow-ups on file.

## 2023-02-09 ENCOUNTER — Telehealth: Payer: Self-pay

## 2023-02-09 ENCOUNTER — Other Ambulatory Visit: Payer: Self-pay | Admitting: Internal Medicine

## 2023-02-09 DIAGNOSIS — I10 Essential (primary) hypertension: Secondary | ICD-10-CM

## 2023-02-09 NOTE — Progress Notes (Signed)
Care Guide Pharmacy Note  02/09/2023 Name: [name] MRN: [id number] DOB: [birth year]  Referred By: [name], MD Reason for referral: Care Coordination (TNM Diabetes. )   Nicholas Whitehead is a 68 y.o. year old male who is a primary care patient of [name], MD.  Anurag Scarfo was referred to the pharmacist for assistance related to: DMII  Successful contact was made with the patient to discuss pharmacy services.  Patient declines engagement at this time. Contact information was provided to the patient should they wish to reach out for assistance at a later time.  [facility]  [facility], [facility] Health Care Management Assistant Direct Dial: [phone]  Fax: [phone]

## 2023-02-20 ENCOUNTER — Other Ambulatory Visit: Payer: Self-pay | Admitting: Internal Medicine

## 2023-02-20 NOTE — Telephone Encounter (Unsigned)
Copied from CRM [id number]. Topic: Clinical - Medication Refill >> Feb 20, 2023  3:23 PM Adele Barthel wrote: Most Recent Primary Care Visit:  Provider: Wyvonne Lenz  Department: [facility]  Visit Type: MEDICARE AWV, SEQUENTIAL  Date: 02/05/2023  Medication: Continuous Glucose Sensor (FREESTYLE LIBRE 3 PLUS SENSOR) MISC  Has the patient contacted their pharmacy? Yes (Agent: If no, request that the patient contact the pharmacy for the refill. If patient does not wish to contact the pharmacy document the reason why and proceed with request.) (Agent: If yes, when and what did the pharmacy advise?)  Is this the correct pharmacy for this prescription? Yes If no, delete pharmacy and type the correct one.  This is the patient's preferred pharmacy:   [id number] - [place], [place] - [address] AT [facility] OF [address] [place] [address] Phone: [phone] Fax: [phone]   Has the prescription been filled recently? Yes  Is the patient out of the medication? No  Has the patient been seen for an appointment in the last year OR does the patient have an upcoming appointment? Yes  Can we respond through MyChart? Yes  Agent: Please be advised that Rx refills may take up to 3 business days. We ask that you follow-up with your pharmacy.

## 2023-02-21 ENCOUNTER — Other Ambulatory Visit: Payer: Self-pay

## 2023-02-21 ENCOUNTER — Other Ambulatory Visit: Payer: 59

## 2023-02-21 DIAGNOSIS — B20 Human immunodeficiency virus [HIV] disease: Secondary | ICD-10-CM

## 2023-02-22 LAB — T-HELPER CELL (CD4) - (RCID CLINIC ONLY)
CD4 % Helper T Cell: 31 % — ABNORMAL LOW (ref 33–65)
CD4 T Cell Abs: 438 /uL (ref 400–1790)

## 2023-02-22 MED ORDER — FREESTYLE LIBRE 3 PLUS SENSOR MISC: 1.0000 | 4 refills | Status: DC

## 2023-02-24 LAB — HIV-1 RNA QUANT-NO REFLEX-BLD
HIV 1 RNA Quant: NOT DETECTED {copies}/mL
HIV-1 RNA Quant, Log: NOT DETECTED {Log}

## 2023-02-28 ENCOUNTER — Other Ambulatory Visit: Payer: Self-pay

## 2023-02-28 ENCOUNTER — Encounter: Payer: Self-pay | Admitting: Internal Medicine

## 2023-02-28 ENCOUNTER — Other Ambulatory Visit: Payer: Self-pay | Admitting: Internal Medicine

## 2023-02-28 ENCOUNTER — Ambulatory Visit (INDEPENDENT_AMBULATORY_CARE_PROVIDER_SITE_OTHER): Payer: 59 | Admitting: Internal Medicine

## 2023-02-28 VITALS — BP 122/73 | HR 74 | Temp 98.1°F | Ht 72.0 in | Wt 192.0 lb

## 2023-02-28 DIAGNOSIS — I10 Essential (primary) hypertension: Secondary | ICD-10-CM

## 2023-02-28 DIAGNOSIS — B20 Human immunodeficiency virus [HIV] disease: Secondary | ICD-10-CM | POA: Diagnosis not present

## 2023-02-28 DIAGNOSIS — F4320 Adjustment disorder, unspecified: Secondary | ICD-10-CM | POA: Insufficient documentation

## 2023-02-28 DIAGNOSIS — F32A Depression, unspecified: Secondary | ICD-10-CM

## 2023-02-28 DIAGNOSIS — E1140 Type 2 diabetes mellitus with diabetic neuropathy, unspecified: Secondary | ICD-10-CM

## 2023-02-28 DIAGNOSIS — F4321 Adjustment disorder with depressed mood: Secondary | ICD-10-CM | POA: Insufficient documentation

## 2023-02-28 NOTE — Progress Notes (Signed)
   Subjective:    Patient ID: Nicholas Whitehead, male    DOB: [birth year], 68 y.o.   MRN: [id number]  HPI Nicholas Whitehead is here for follow-up of HIV. Continues on Norris and denies any missed doses.  He unfortunately has had issues with grief after losing his brother in December and then another good friend last month.  He is appropriately grieving and sad about this.  He also has felt more lonely having lost people close to him.  Otherwise no new complaints.   Review of Systems  Constitutional:  Negative for fatigue.  Gastrointestinal:  Negative for diarrhea.  Skin:  Negative for rash.       Objective:   Physical Exam Eyes:     General: No scleral icterus. Pulmonary:     Effort: Pulmonary effort is normal.  Neurological:     Mental Status: He is alert.   SH: no tobacco        Assessment & Plan:

## 2023-02-28 NOTE — Assessment & Plan Note (Signed)
I had a long discussion with him regarding his recent stressors and grief and active listening and voiced my support.  I also offered counseling and he will consider and call back if he is interested.  I have personally spent 41 minutes involved in face-to-face and non-face-to-face activities for this patient on the day of the visit. Professional time spent includes the following activities: Preparing to see the patient (review of tests), Obtaining and/or reviewing separately obtained history (admission/discharge record), Performing a medically appropriate examination and/or evaluation , Ordering medications/tests/procedures, referring and communicating with other health care professionals, Documenting clinical information in the EMR, Independently interpreting results (not separately reported), Communicating results to the patient/family/caregiver, Counseling and educating the patient/family/caregiver and Care coordination (not separately reported).

## 2023-02-28 NOTE — Assessment & Plan Note (Signed)
He is doing well and no problems with the medication.  Labs reviewed with him and no concerns.  He will follow-up in 6 months.

## 2023-03-02 ENCOUNTER — Other Ambulatory Visit: Payer: 59

## 2023-03-02 ENCOUNTER — Telehealth: Payer: Self-pay | Admitting: Internal Medicine

## 2023-03-02 DIAGNOSIS — Z794 Long term (current) use of insulin: Secondary | ICD-10-CM | POA: Diagnosis not present

## 2023-03-02 DIAGNOSIS — E1165 Type 2 diabetes mellitus with hyperglycemia: Secondary | ICD-10-CM | POA: Diagnosis not present

## 2023-03-02 NOTE — Telephone Encounter (Signed)
Copied from CRM [id number]. Topic: Clinical - Prescription Issue >> Mar 02, 2023  2:14 PM Isabell A wrote: Reason for CRM: Viola from [employer] calling in regard to albuterol - states they faxed paperwork in regard to refills, checking the status of this.   Callback number: [phone]

## 2023-03-03 LAB — BASIC METABOLIC PANEL
BUN: 14 mg/dL (ref 7–25)
CO2: 31 mmol/L (ref 20–32)
Calcium: 9.6 mg/dL (ref 8.6–10.3)
Chloride: 102 mmol/L (ref 98–110)
Creat: 1.17 mg/dL (ref 0.70–1.35)
Glucose, Bld: 197 mg/dL — ABNORMAL HIGH (ref 65–99)
Potassium: 4.9 mmol/L (ref 3.5–5.3)
Sodium: 139 mmol/L (ref 135–146)

## 2023-03-03 LAB — HEMOGLOBIN A1C
Hgb A1c MFr Bld: 7.7 %{Hb} — ABNORMAL HIGH (ref <5.7)
Mean Plasma Glucose: 174 mg/dL
eAG (mmol/L): 9.7 mmol/L

## 2023-03-05 DIAGNOSIS — H40013 Open angle with borderline findings, low risk, bilateral: Secondary | ICD-10-CM | POA: Diagnosis not present

## 2023-03-05 DIAGNOSIS — H2512 Age-related nuclear cataract, left eye: Secondary | ICD-10-CM | POA: Diagnosis not present

## 2023-03-05 NOTE — Telephone Encounter (Signed)
 Unable to reach viola. LMTRC

## 2023-03-06 ENCOUNTER — Other Ambulatory Visit: Payer: Self-pay | Admitting: Internal Medicine

## 2023-03-06 DIAGNOSIS — J410 Simple chronic bronchitis: Secondary | ICD-10-CM

## 2023-03-08 ENCOUNTER — Encounter: Payer: Self-pay | Admitting: Endocrinology

## 2023-03-08 ENCOUNTER — Encounter: Payer: Self-pay | Admitting: Internal Medicine

## 2023-03-08 ENCOUNTER — Ambulatory Visit: Payer: 59 | Admitting: Internal Medicine

## 2023-03-08 ENCOUNTER — Ambulatory Visit (INDEPENDENT_AMBULATORY_CARE_PROVIDER_SITE_OTHER): Payer: 59 | Admitting: Endocrinology

## 2023-03-08 VITALS — BP 122/58 | HR 75 | Temp 98.2°F | Resp 16 | Ht 72.0 in | Wt 190.4 lb

## 2023-03-08 VITALS — BP 98/56 | HR 65 | Resp 16 | Ht 72.0 in | Wt 190.0 lb

## 2023-03-08 DIAGNOSIS — E0840 Diabetes mellitus due to underlying condition with diabetic neuropathy, unspecified: Secondary | ICD-10-CM

## 2023-03-08 DIAGNOSIS — R011 Cardiac murmur, unspecified: Secondary | ICD-10-CM

## 2023-03-08 DIAGNOSIS — R9431 Abnormal electrocardiogram [ECG] [EKG]: Secondary | ICD-10-CM | POA: Diagnosis not present

## 2023-03-08 DIAGNOSIS — R0609 Other forms of dyspnea: Secondary | ICD-10-CM | POA: Diagnosis not present

## 2023-03-08 DIAGNOSIS — Z794 Long term (current) use of insulin: Secondary | ICD-10-CM

## 2023-03-08 DIAGNOSIS — Z0001 Encounter for general adult medical examination with abnormal findings: Secondary | ICD-10-CM | POA: Diagnosis not present

## 2023-03-08 DIAGNOSIS — N400 Enlarged prostate without lower urinary tract symptoms: Secondary | ICD-10-CM | POA: Diagnosis not present

## 2023-03-08 DIAGNOSIS — E785 Hyperlipidemia, unspecified: Secondary | ICD-10-CM | POA: Diagnosis not present

## 2023-03-08 DIAGNOSIS — Z1211 Encounter for screening for malignant neoplasm of colon: Secondary | ICD-10-CM | POA: Insufficient documentation

## 2023-03-08 DIAGNOSIS — E1165 Type 2 diabetes mellitus with hyperglycemia: Secondary | ICD-10-CM

## 2023-03-08 DIAGNOSIS — I10 Essential (primary) hypertension: Secondary | ICD-10-CM

## 2023-03-08 LAB — HEPATIC FUNCTION PANEL
ALT: 12 U/L (ref 0–53)
AST: 15 U/L (ref 0–37)
Albumin: 4.2 g/dL (ref 3.5–5.2)
Alkaline Phosphatase: 51 U/L (ref 39–117)
Bilirubin, Direct: 0.1 mg/dL (ref 0.0–0.3)
Total Bilirubin: 0.3 mg/dL (ref 0.2–1.2)
Total Protein: 7.5 g/dL (ref 6.0–8.3)

## 2023-03-08 LAB — CBC WITH DIFFERENTIAL/PLATELET
Basophils Absolute: 0 10*3/uL (ref 0.0–0.1)
Basophils Relative: 0.3 % (ref 0.0–3.0)
Eosinophils Absolute: 0.1 10*3/uL (ref 0.0–0.7)
Eosinophils Relative: 1.2 % (ref 0.0–5.0)
HCT: 44.5 % (ref 39.0–52.0)
Hemoglobin: 14.8 g/dL (ref 13.0–17.0)
Lymphocytes Relative: 28 % (ref 12.0–46.0)
Lymphs Abs: 1.4 10*3/uL (ref 0.7–4.0)
MCHC: 33.2 g/dL (ref 30.0–36.0)
MCV: 100.5 fL — ABNORMAL HIGH (ref 78.0–100.0)
Monocytes Absolute: 0.4 10*3/uL (ref 0.1–1.0)
Monocytes Relative: 7.4 % (ref 3.0–12.0)
Neutro Abs: 3.3 10*3/uL (ref 1.4–7.7)
Neutrophils Relative %: 63.1 % (ref 43.0–77.0)
Platelets: 167 10*3/uL (ref 150.0–400.0)
RBC: 4.43 Mil/uL (ref 4.22–5.81)
RDW: 14 % (ref 11.5–15.5)
WBC: 5.2 10*3/uL (ref 4.0–10.5)

## 2023-03-08 LAB — LIPID PANEL
Cholesterol: 137 mg/dL (ref 0–200)
HDL: 50.5 mg/dL (ref >39.00)
LDL Cholesterol: 58 mg/dL (ref 0–99)
NonHDL: 86.52
Total CHOL/HDL Ratio: 3
Triglycerides: 143 mg/dL (ref 0.0–149.0)
VLDL: 28.6 mg/dL (ref 0.0–40.0)

## 2023-03-08 LAB — PSA: PSA: 1.56 ng/mL (ref 0.10–4.00)

## 2023-03-08 LAB — TSH: TSH: 0.8 u[IU]/mL (ref 0.35–5.50)

## 2023-03-08 MED ORDER — FREESTYLE LIBRE 3 PLUS SENSOR MISC: 1.0000 | 4 refills | Status: DC

## 2023-03-08 NOTE — Progress Notes (Signed)
 Patient B/P low, per patient it was also low at his appointment at different MD office prior to this. Patient is asymptomatic.   Samples of this drug (Free style libre 3 plus) were given to the patient, quantity 2

## 2023-03-08 NOTE — Progress Notes (Signed)
 Outpatient Endocrinology Note [place] Bo Teicher, MD  03/08/23  Patient's Name: Nicholas Whitehead    DOB: 06-05-1955    MRN: [id number]                                                    REASON OF VISIT: Follow up for type 2 diabetes mellitus  PCP: [name], MD  HISTORY OF PRESENT ILLNESS:   Nicholas Whitehead is a 68 y.o. old male with past medical history listed below, is here for follow up for type 2 diabetes mellitus.   Pertinent Diabetes History: Patient was diagnosed with type 2 diabetes mellitus in 1998.  Insulin therapy was started around 2006. He requires large doses of insulin especially basal insulin.  Chronic Diabetes Complications : Retinopathy: no. Last ophthalmology exam was done on 08/2022, annually, following with ophthalmology regularly.  Nephropathy: no, on ACE/ARB /olmesartan. Peripheral neuropathy: yes, on Has had paresthesiae in feet and legs, these are treated with gabapentin 300 mg 3 times a day.  He follows up with podiatry as well. Coronary artery disease: no Stroke: no  Relevant comorbidities and cardiovascular risk factors: Obesity: no Body mass index is 25.77 kg/m.  Hypertension: Yes  Hyperlipidemia : Yes, on statin   Current / Home Diabetic regimen includes:   Insulin regimen: Toujeo: 35 units bid; Humalog 30 units before meals  three times a day.        Oral hypoglycemic drugs: Actos 30 mg, Ozempic 0.5 weekly, Farxiga 10mg  daily.    Prior diabetic medications: Invokana in the past, increase urgency and frequency of urination.  Had nausea with Ozempic 1 mg.  Glycemic data:   Patient has not been using freestyle libre 3 CGM.  He reports not able to refill.  He has been checking blood sugar with fingerstick however did not bring the glucometer in the clinic today.  He denies hypoglycemia.  Hypoglycemia: Patient has no hypoglycemic episodes. Patient has hypoglycemia awareness.  Factors modifying glucose control: 1.  Diabetic diet assessment: 3 meals  a day.  Usually avoiding high-fat meals.  2.  Staying active or exercising: No formal exercise.  3.  Medication compliance: compliant most of the time.  Interval history  Hemoglobin A1c recently 7.7%, improving.  Diabetes regimen reviewed and as noted above.  Patient reports he had stressful time lately with few deaths in family and friends circle.  He felt somewhat depressed, had visit with primary care provider this morning.  Discussed that if he is not eating enough recommend to decrease the dose of insulin appropriately.  He denies hypoglycemia at this time.  He has no other complaints today.  REVIEW OF SYSTEMS As per history of present illness.   PAST MEDICAL HISTORY: Past Medical History:  Diagnosis Date   Anxiety    Cataract    right    Depression    Diabetes mellitus without complication (HCC)    DM (diabetes mellitus screen)    HIV disease (HCC) 09/12/1995   HIV infection (HCC)    Hypercholesterolemia    Hyperlipidemia    Hypertension     PAST SURGICAL HISTORY: Past Surgical History:  Procedure Laterality Date   COLONOSCOPY WITH PROPOFOL  01/16/2012   Procedure: COLONOSCOPY WITH PROPOFOL;  Surgeon: Charolett Bumpers, MD;  Location: [facility];  Service: Endoscopy;  Laterality: N/A;   EYE SURGERY  01/10/2003   catarct surgery   foot sugery Bilateral 12/29/2022   optic lens surgery  01/10/2003    ALLERGIES: Allergies  Allergen Reactions   Benazepril Cough   Molds & Smuts     Hard to breathe   Pravastatin Other (See Comments)    myositis   Glipizide Nausea Only    FAMILY HISTORY:  Family History  Problem Relation Age of Onset   Arthritis Mother    Diabetes Mother    Heart disease Mother    Stroke Mother    Arthritis Father    Diabetes Father    Heart disease Father     SOCIAL HISTORY: Social History   Socioeconomic History   Marital status: Single    Spouse name: Not on file   Number of children: Not on file   Years of education: Not on file    Highest education level: Not on file  Occupational History   Occupation: disability  Tobacco Use   Smoking status: Every Day    Current packs/day: 0.25    Types: Cigarettes   Smokeless tobacco: Never   Tobacco comments:    Occ smoking/ trying to quit  Vaping Use   Vaping status: Never Used  Substance and Sexual Activity   Alcohol use: No    Alcohol/week: 0.0 standard drinks of alcohol    Comment: socially   Drug use: No   Sexual activity: Never    Birth control/protection: Other-see comments    Comment: declined condoms  Other Topics Concern   Not on file  Social History Narrative   Lives alone   Social Drivers of Health   Financial Resource Strain: Medium Risk (02/05/2023)   Overall Financial Resource Strain (CARDIA)    Difficulty of Paying Living Expenses: Somewhat hard  Food Insecurity: Food Insecurity Present (02/05/2023)   Hunger Vital Sign    Worried About Running Out of Food in the Last Year: Sometimes true    Ran Out of Food in the Last Year: Sometimes true  Transportation Needs: No Transportation Needs (02/05/2023)   PRAPARE - Administrator, Civil Service (Medical): No    Lack of Transportation (Non-Medical): No  Physical Activity: Inactive (02/05/2023)   Exercise Vital Sign    Days of Exercise per Week: 0 days    Minutes of Exercise per Session: 0 min  Stress: No Stress Concern Present (02/05/2023)   [employer] of Occupational Health - Occupational Stress Questionnaire    Feeling of Stress : Only a little  Social Connections: Unknown (02/05/2023)   Social Connection and Isolation Panel [NHANES]    Frequency of Communication with Friends and Family: More than three times a week    Frequency of Social Gatherings with Friends and Family: Never    Attends Religious Services: Not on Marketing executive or Organizations: Not on file    Attends Banker Meetings: Not on file    Marital Status: Never married     MEDICATIONS:  Current Outpatient Medications  Medication Sig Dispense Refill   ACCU-CHEK GUIDE test strip 1 each by Other route 4 (four) times daily. TEST BLOOD SUGAR FOUR TIMES DAILY 400 strip 1   albuterol (VENTOLIN HFA) 108 (90 Base) MCG/ACT inhaler INHALE TWO PUFFS BY MOUTH INTO LUNGS EVERY 6 HOURS AS NEEDED FOR WHEEZING OR SHORTNESS OF BREATH 6.7 g 3   Alcohol Swabs (B-D SINGLE USE SWABS REGULAR) PADS USE AS DIRECTED 300 each 1   aspirin EC  81 MG tablet Take 81 mg by mouth every morning.     Blood Glucose Monitoring Suppl (ACCU-CHEK GUIDE) w/Device KIT Use to check blood sugars four times a day 1 kit 0   cetirizine (ZYRTEC) 10 MG tablet Take 10 mg by mouth daily.     Cholecalciferol 2000 units TABS Take 1 tablet (2,000 Units total) by mouth daily. 90 tablet 1   Continuous Glucose Sensor (FREESTYLE LIBRE 3 PLUS SENSOR) MISC Apply topically every 14 (fourteen) days.     dapagliflozin propanediol (FARXIGA) 10 MG TABS tablet Take 1 tablet (10 mg total) by mouth daily. 90 tablet 3   DULoxetine (CYMBALTA) 60 MG capsule TAKE ONE CAPSULE (60 MG) BY MOUTH DAILY AT 9AM 90 capsule 0   gabapentin (NEURONTIN) 300 MG capsule TAKE ONE CAPSULE BY MOUTH THREE TIMES DAILY @ 9AM-3PM-9PM 270 capsule 0   Glucagon (GVOKE HYPOPEN 2-PACK) 1 MG/0.2ML SOAJ Inject 1 Act into the skin daily as needed. 2 mL 6   Glucagon, rDNA, (GLUCAGON EMERGENCY) 1 MG KIT Inject 1 mg into the skin once for 1 dose. 1 kit 5   Glycopyrrolate-Formoterol (BEVESPI AEROSPHERE) 9-4.8 MCG/ACT AERO Inhale 2 puffs into the lungs 2 (two) times daily. 32.1 g 1   HUMALOG KWIKPEN 100 UNIT/ML KwikPen ADMINISTER 20-25 UNITS UNDER THE SKIN THREE TIMES DAILY 45 mL 1   Insulin Pen Needle (B-D ULTRAFINE III SHORT PEN) 31G X 8 MM MISC USE TO INJECT INSULIN AS DIRECTED THREE TIMES DAILY PER HUMALOG 500 each 1   Lancets MISC Inject 1 Act into the skin 4 (four) times daily. Use to test blood sugar tid. DX: E11.8 300 each 3   mirtazapine (REMERON) 7.5 MG  tablet TAKE ONE TABLET BY MOUTH DAILY AT 9PM 90 tablet 0   mupirocin ointment (BACTROBAN) 2 % Apply 1 Application topically 2 (two) times daily. 30 g 2   Nebivolol HCl 20 MG TABS TAKE 1 TABLET(20 MG) BY MOUTH DAILY 90 tablet 1   ODEFSEY 200-25-25 MG TABS tablet TAKE ONE TABLET BY MOUTH DAILY 90 tablet 0   olmesartan (BENICAR) 40 MG tablet TAKE ONE TABLET (40 MG) BY MOUTH DAILY 90 tablet 1   pioglitazone (ACTOS) 30 MG tablet TAKE 1 TABLET(30 MG) BY MOUTH DAILY 90 tablet 3   Pitavastatin Calcium (LIVALO) 1 MG TABS TAKE 1 TABLET(1 MG) BY MOUTH DAILY 90 tablet 0   Semaglutide,0.25 or 0.5MG /DOS, (OZEMPIC, 0.25 OR 0.5 MG/DOSE,) 2 MG/3ML SOPN Inject 0.5 mg into the skin once a week. 9 mL 3   Suvorexant (BELSOMRA) 20 MG TABS Take 1 tablet (20 mg total) by mouth at bedtime as needed. 90 tablet 0   torsemide (DEMADEX) 20 MG tablet TAKE ONE TABLET BY MOUTH ON MONDAY, WEDNESDAY AND FRIDAY 36 tablet 2   TOUJEO SOLOSTAR 300 UNIT/ML Solostar Pen INJECT 25-30 UNITS UNDER THE SKIN 2 times a day. 6 mL 4   traZODone (DESYREL) 150 MG tablet TAKE THREE TABLETS BY MOUTH DAILY AT 9PM 270 tablet 0   Continuous Glucose Sensor (FREESTYLE LIBRE 3 PLUS SENSOR) MISC 1 each by Does not apply route continuous. Change every 15 days. 6 each 4   No current facility-administered medications for this visit.    PHYSICAL EXAM: Vitals:   03/08/23 0955  BP: (!) 98/56  Pulse: 65  Resp: 16  SpO2: 94%  Weight: 190 lb (86.2 kg)  Height: 6' (1.829 m)   Body mass index is 25.77 kg/m.  Wt Readings from Last 3 Encounters:  03/08/23 190  lb (86.2 kg)  03/08/23 190 lb 6.4 oz (86.4 kg)  02/28/23 192 lb (87.1 kg)    General: Well developed, well nourished male in no apparent distress.  HEENT: AT/[place], no external lesions.  Eyes: Conjunctiva clear and no icterus. Neck: Neck supple  Lungs: Respirations not labored Neurologic: Alert, oriented, normal speech Extremities / Skin: Dry. No sores or rashes noted.  Psychiatric: Does not  appear depressed or anxious  Diabetic Foot Exam - Simple   No data filed    LABS Reviewed Lab Results  Component Value Date   HGBA1C 7.7 (H) 03/02/2023   HGBA1C 8.1 (H) 11/29/2022   HGBA1C 7.8 (H) 08/07/2022   Lab Results  Component Value Date   FRUCTOSAMINE 316 (H) 03/10/2021   FRUCTOSAMINE 367 (H) 02/01/2021   FRUCTOSAMINE 280 09/11/2019   Lab Results  Component Value Date   CHOL 165 01/24/2022   HDL 43 01/24/2022   LDLCALC 95 01/24/2022   TRIG 170 (H) 01/24/2022   CHOLHDL 3.8 01/24/2022   Lab Results  Component Value Date   MICRALBCREAT 0.9 11/29/2022   MICRALBCREAT 0.8 07/27/2022   Lab Results  Component Value Date   CREATININE 1.17 03/02/2023   Lab Results  Component Value Date   GFR 60.61 11/29/2022    ASSESSMENT / PLAN  1. Uncontrolled type 2 diabetes mellitus with hyperglycemia, with long-term current use of insulin (HCC)     Diabetes Mellitus type 2, complicated by diabetic neuropathy. - Diabetic status / severity: Uncontrolled.  Lab Results  Component Value Date   HGBA1C 7.7 (H) 03/02/2023    - Hemoglobin A1c goal : <7%  No glucose data or CGM data to review.  He has improvement of diabetes status with hemoglobin A1c 7.7% recently.  - Medications: No change.  Insulin regimen: Toujeo: 35 units bid; Humalog 30 units before meals          Oral hypoglycemic drugs: Actos 30 mg, Ozempic 0.5 weekly, Farxiga 10mg  daily.  - Home glucose testing: CGM and check as needed.  Sent prescription for freestyle libre 3+.  2 sensors samples provided in the clinic today. - Discussed/ Gave Hypoglycemia treatment plan.  # Consult : not required at this time.   # Annual urine for microalbuminuria/ creatinine ratio, no microalbuminuria currently, continue ACE/ARB /olmesartan. Last  Lab Results  Component Value Date   MICRALBCREAT 0.9 11/29/2022    # Foot check nightly / neuropathy, continue gabapentin 300 mg 3 times a day.  Managed by PCP.  # Annual  dilated diabetic eye exams.   - Diet: Make healthy diabetic food choices - Life style / activity / exercise: Discussed.  2. Blood pressure  -  BP Readings from Last 1 Encounters:  03/08/23 (!) 98/56    - Control is in target.  - No change in current plans.  3. Lipid status / Hyperlipidemia - Last  Lab Results  Component Value Date   LDLCALC 95 01/24/2022   - Continue pitavastatin 1 mg daily.  Managed by primary care provider.  Diagnoses and all orders for this visit:  Uncontrolled type 2 diabetes mellitus with hyperglycemia, with long-term current use of insulin (HCC) -     Continuous Glucose Sensor (FREESTYLE LIBRE 3 PLUS SENSOR) MISC; 1 each by Does not apply route continuous. Change every 15 days. -     BASIC METABOLIC PANEL WITH GFR -     Hemoglobin A1c     DISPOSITION Follow up in clinic in 3 months suggested.  Labs  prior to follow-up visit.   All questions answered and patient verbalized understanding of the plan.  [place] Theo Reither, MD [facility] Endocrinology [facility] [address] [place], [address] [place], [place] [address] Phone # [phone]  At least part of this note was generated using voice recognition software. Inadvertent word errors may have occurred, which were not recognized during the proofreading process.

## 2023-03-08 NOTE — Progress Notes (Signed)
 Subjective:  Patient ID: Nicholas Whitehead, male    DOB: 1955-03-05  Age: 68 y.o. MRN: [id number]  CC: Annual Exam, Hypertension, Hyperlipidemia, and Diabetes   HPI [name] presents for a CPX and f/up -----   Discussed the use of AI scribe software for clinical note transcription with the patient, who gave verbal consent to proceed.  History of Present Illness   Nicholas Whitehead is a 68 year old male who presents with fatigue, insomnia, and low appetite following multiple family and friend deaths.  He experiences fatigue, insomnia, and decreased appetite, which he attributes to the emotional impact of multiple recent deaths among friends and family. He describes his energy level as 'kind of down a little bit' and notes that he is not sleeping much, which he believes is affecting his appetite.  No chest pain, shortness of breath, dizziness, or lightheadedness, although he acknowledges that his blood pressure has been low, which he states 'comes and goes'.  His brother had COPD and recently passed away, and he expresses emotional distress related to family dynamics surrounding his brother's death.       Outpatient Medications Prior to Visit  Medication Sig Dispense Refill   ACCU-CHEK GUIDE test strip 1 each by Other route 4 (four) times daily. TEST BLOOD SUGAR FOUR TIMES DAILY 400 strip 1   albuterol (VENTOLIN HFA) 108 (90 Base) MCG/ACT inhaler INHALE TWO PUFFS BY MOUTH INTO LUNGS EVERY 6 HOURS AS NEEDED FOR WHEEZING OR SHORTNESS OF BREATH 6.7 g 3   Alcohol Swabs (B-D SINGLE USE SWABS REGULAR) PADS USE AS DIRECTED 300 each 1   aspirin EC 81 MG tablet Take 81 mg by mouth every morning.     Blood Glucose Monitoring Suppl (ACCU-CHEK GUIDE) w/Device KIT Use to check blood sugars four times a day 1 kit 0   cetirizine (ZYRTEC) 10 MG tablet Take 10 mg by mouth daily.     Cholecalciferol 2000 units TABS Take 1 tablet (2,000 Units total) by mouth daily. 90 tablet 1   Continuous Glucose Sensor  (FREESTYLE LIBRE 3 PLUS SENSOR) MISC Apply topically every 14 (fourteen) days.     dapagliflozin propanediol (FARXIGA) 10 MG TABS tablet Take 1 tablet (10 mg total) by mouth daily. 90 tablet 3   DULoxetine (CYMBALTA) 60 MG capsule TAKE ONE CAPSULE (60 MG) BY MOUTH DAILY AT 9AM 90 capsule 0   gabapentin (NEURONTIN) 300 MG capsule TAKE ONE CAPSULE BY MOUTH THREE TIMES DAILY @ 9AM-3PM-9PM 270 capsule 0   Glucagon (GVOKE HYPOPEN 2-PACK) 1 MG/0.2ML SOAJ Inject 1 Act into the skin daily as needed. 2 mL 6   Glucagon, rDNA, (GLUCAGON EMERGENCY) 1 MG KIT Inject 1 mg into the skin once for 1 dose. 1 kit 5   Glycopyrrolate-Formoterol (BEVESPI AEROSPHERE) 9-4.8 MCG/ACT AERO Inhale 2 puffs into the lungs 2 (two) times daily. 32.1 g 1   HUMALOG KWIKPEN 100 UNIT/ML KwikPen ADMINISTER 20-25 UNITS UNDER THE SKIN THREE TIMES DAILY 45 mL 1   Insulin Pen Needle (B-D ULTRAFINE III SHORT PEN) 31G X 8 MM MISC USE TO INJECT INSULIN AS DIRECTED THREE TIMES DAILY PER HUMALOG 500 each 1   Lancets MISC Inject 1 Act into the skin 4 (four) times daily. Use to test blood sugar tid. DX: E11.8 300 each 3   mirtazapine (REMERON) 7.5 MG tablet TAKE ONE TABLET BY MOUTH DAILY AT 9PM 90 tablet 0   mupirocin ointment (BACTROBAN) 2 % Apply 1 Application topically 2 (two) times daily. 30 g 2  Nebivolol HCl 20 MG TABS TAKE 1 TABLET(20 MG) BY MOUTH DAILY 90 tablet 1   ODEFSEY 200-25-25 MG TABS tablet TAKE ONE TABLET BY MOUTH DAILY 90 tablet 0   olmesartan (BENICAR) 40 MG tablet TAKE ONE TABLET (40 MG) BY MOUTH DAILY 90 tablet 1   pioglitazone (ACTOS) 30 MG tablet TAKE 1 TABLET(30 MG) BY MOUTH DAILY 90 tablet 3   Semaglutide,0.25 or 0.5MG /DOS, (OZEMPIC, 0.25 OR 0.5 MG/DOSE,) 2 MG/3ML SOPN Inject 0.5 mg into the skin once a week. 9 mL 3   Suvorexant (BELSOMRA) 20 MG TABS Take 1 tablet (20 mg total) by mouth at bedtime as needed. 90 tablet 0   torsemide (DEMADEX) 20 MG tablet TAKE ONE TABLET BY MOUTH ON MONDAY, WEDNESDAY AND FRIDAY 36 tablet  2   TOUJEO SOLOSTAR 300 UNIT/ML Solostar Pen INJECT 25-30 UNITS UNDER THE SKIN 2 times a day. 6 mL 4   traZODone (DESYREL) 150 MG tablet TAKE THREE TABLETS BY MOUTH DAILY AT 9PM 270 tablet 0   Continuous Glucose Sensor (FREESTYLE LIBRE 3 PLUS SENSOR) MISC 1 each by Does not apply route continuous. Change every 15 days. 6 each 4   Pitavastatin Calcium (LIVALO) 1 MG TABS TAKE 1 TABLET(1 MG) BY MOUTH DAILY 90 tablet 0   No facility-administered medications prior to visit.    ROS Review of Systems  Constitutional:  Positive for fatigue. Negative for appetite change, diaphoresis and unexpected weight change.  HENT: Negative.  Negative for trouble swallowing.   Eyes:  Negative for visual disturbance.  Respiratory:  Positive for shortness of breath (DOE). Negative for cough, chest tightness and wheezing.   Cardiovascular:  Negative for chest pain, palpitations and leg swelling.  Gastrointestinal:  Positive for constipation.  Genitourinary: Negative.  Negative for difficulty urinating, dysuria and hematuria.  Musculoskeletal: Negative.  Negative for myalgias.  Skin: Negative.   Neurological: Negative.  Negative for dizziness and weakness.  Hematological:  Negative for adenopathy. Does not bruise/bleed easily.  Psychiatric/Behavioral:  Positive for dysphoric mood and sleep disturbance. Negative for confusion, decreased concentration and suicidal ideas. The patient is not nervous/anxious.     Objective:  BP (!) 122/58 (BP Location: Left Arm, Patient Position: Sitting, Cuff Size: Small)   Pulse 75   Temp 98.2 F (36.8 C) (Oral)   Resp 16   Ht 6' (1.829 m)   Wt 190 lb 6.4 oz (86.4 kg)   SpO2 97%   BMI 25.82 kg/m   BP Readings from Last 3 Encounters:  03/08/23 (!) 98/56  03/08/23 (!) 122/58  02/28/23 122/73    Wt Readings from Last 3 Encounters:  03/08/23 190 lb (86.2 kg)  03/08/23 190 lb 6.4 oz (86.4 kg)  02/28/23 192 lb (87.1 kg)    Physical Exam Vitals reviewed.   Constitutional:      Appearance: Normal appearance.  HENT:     Nose: Nose normal.     Mouth/Throat:     Mouth: Mucous membranes are moist.  Eyes:     General: No scleral icterus.    Conjunctiva/sclera: Conjunctivae normal.  Cardiovascular:     Rate and Rhythm: Normal rate and regular rhythm.     Heart sounds: Murmur heard.     Systolic murmur is present with a grade of 1/6.     No friction rub. No gallop.     Comments: EKG- SR with 1st degree AV block, 68 bpm LAD Anterior infarct pattern is old Unchanged  Pulmonary:     Effort: Pulmonary effort is  normal.     Breath sounds: No stridor. No wheezing, rhonchi or rales.  Abdominal:     General: Abdomen is flat.     Palpations: There is no mass.     Tenderness: There is no abdominal tenderness. There is no guarding.     Hernia: No hernia is present. There is no hernia in the left inguinal area or right inguinal area.  Genitourinary:    Pubic Area: No rash.      Penis: Normal and circumcised.      Testes: Normal.     Epididymis:     Right: Normal.     Left: Normal.     Prostate: Enlarged. Not tender and no nodules present.     Rectum: Normal. Guaiac result negative. No mass, tenderness, anal fissure, external hemorrhoid or internal hemorrhoid. Normal anal tone.  Musculoskeletal:     Cervical back: Neck supple.     Right lower leg: No edema.     Left lower leg: No edema.  Lymphadenopathy:     Cervical: No cervical adenopathy.     Lower Body: No right inguinal adenopathy. No left inguinal adenopathy.  Skin:    General: Skin is warm and dry.     Findings: No rash.  Neurological:     General: No focal deficit present.     Mental Status: He is alert.  Psychiatric:        Mood and Affect: Mood normal.        Behavior: Behavior normal.     Lab Results  Component Value Date   WBC 5.2 03/08/2023   HGB 14.8 03/08/2023   HCT 44.5 03/08/2023   PLT 167.0 03/08/2023   GLUCOSE 197 (H) 03/02/2023   CHOL 137 03/08/2023    TRIG 143.0 03/08/2023   HDL 50.50 03/08/2023   LDLCALC 58 03/08/2023   ALT 12 03/08/2023   AST 15 03/08/2023   NA 139 03/02/2023   K 4.9 03/02/2023   CL 102 03/02/2023   CREATININE 1.17 03/02/2023   BUN 14 03/02/2023   CO2 31 03/02/2023   TSH 0.80 03/08/2023   PSA 1.56 03/08/2023   HGBA1C 7.7 (H) 03/02/2023   MICROALBUR 0.8 11/29/2022    VAS [name] ABI WITH/WO TBI Result Date: 03/15/2022  LOWER EXTREMITY DOPPLER STUDY Patient Name:  DREVIN ORTNER  Date of Exam:   03/15/2022 Medical Rec #: [id number]      Accession #:    [id number] Date of Birth: 21-Oct-1955     Patient Gender: M Patient Age:   20 years Exam Location:  [facility] Procedure:      VAS [name] ABI WITH/WO TBI Referring Phys: Sanda Linger --------------------------------------------------------------------------------  Indications: Peripheral artery disease. Pain of right great toe High Risk Factors: Hypertension, hyperlipidemia, Diabetes.  Comparison Study: 10/16/19 RT ABI 1.24 LT ABI 1.15 Performing Technologist: [place] Ponder RVS  Examination Guidelines: A complete evaluation includes at minimum, Doppler waveform signals and systolic blood pressure reading at the level of bilateral brachial, anterior tibial, and posterior tibial arteries, when vessel segments are accessible. Bilateral testing is considered an integral part of a complete examination. Photoelectric Plethysmograph (PPG) waveforms and toe systolic pressure readings are included as required and additional duplex testing as needed. Limited examinations for reoccurring indications may be performed as noted.  ABI Findings: +---------+------------------+-----+---------+--------+ Right    Rt Pressure (mmHg)IndexWaveform Comment  +---------+------------------+-----+---------+--------+ Brachial 125                                      +---------+------------------+-----+---------+--------+  PTA      133               1.06 triphasic          +---------+------------------+-----+---------+--------+ DP       146               1.17 triphasic         +---------+------------------+-----+---------+--------+ Great Toe114               0.91 Normal            +---------+------------------+-----+---------+--------+ +---------+------------------+-----+---------+-------+ Left     Lt Pressure (mmHg)IndexWaveform Comment +---------+------------------+-----+---------+-------+ Brachial 118                                     +---------+------------------+-----+---------+-------+ PTA      150               1.20 triphasic        +---------+------------------+-----+---------+-------+ DP       131               1.05 triphasic        +---------+------------------+-----+---------+-------+ Great Toe91                0.73 Normal           +---------+------------------+-----+---------+-------+ +-------+-----------+-----------+------------+------------+ ABI/TBIToday's ABIToday's TBIPrevious ABIPrevious TBI +-------+-----------+-----------+------------+------------+ Right  1.17       1.20                                +-------+-----------+-----------+------------+------------+ Left   0.91       0.73                                +-------+-----------+-----------+------------+------------+  Summary: Right: Resting right ankle-brachial index is within normal range. The right toe-brachial index is normal. Left: Resting left ankle-brachial index is within normal range. The left toe-brachial index is normal. *See table(s) above for measurements and observations.  Electronically signed by Lemar Livings MD on 03/15/2022 at 2:04:00 PM.    Final     Assessment & Plan:   Primary hypertension- BP is well controlled. -     CBC with Differential/Platelet; Future -     TSH; Future -     Hepatic function panel; Future -     EKG 12-Lead  Benign prostatic hyperplasia without lower urinary tract symptoms -     PSA;  Future  Hyperlipidemia LDL goal <100- LDL goal achieved. Doing well on the statin  -     Lipid panel; Future -     TSH; Future -     Hepatic function panel; Future  Nonspecific abnormal electrocardiogram (ECG) (EKG) -     ECHOCARDIOGRAM COMPLETE; Future -     CT CORONARY MORPH W/CTA COR W/SCORE W/CA W/CM &/OR WO/CM; Future  Screening for colon cancer -     Ambulatory referral to Gastroenterology  Diabetes mellitus due to underlying condition with diabetic neuropathy, with long-term current use of insulin (HCC) -     HM Diabetes Foot Exam  Murmur, cardiac -     ECHOCARDIOGRAM COMPLETE; Future  DOE (dyspnea on exertion) -     CT CORONARY MORPH W/CTA COR W/SCORE W/CA W/CM &/OR WO/CM; Future  Encounter for general adult medical examination with abnormal findings-  Exam completed, labs reviewed, vaccines reviewed, cancer screenings addressed, pt ed material was given.      Follow-up: Return in about 6 months (around 09/05/2023).  Sanda Linger, MD

## 2023-03-08 NOTE — Telephone Encounter (Signed)
 Unable to reach [facility].

## 2023-03-08 NOTE — Patient Instructions (Signed)
 Health Maintenance, Male  Adopting a healthy lifestyle and getting preventive care are important in promoting health and wellness. Ask your health care provider about:  The right schedule for you to have regular tests and exams.  Things you can do on your own to prevent diseases and keep yourself healthy.  What should I know about diet, weight, and exercise?  Eat a healthy diet    Eat a diet that includes plenty of vegetables, fruits, low-fat dairy products, and lean protein.  Do not eat a lot of foods that are high in solid fats, added sugars, or sodium.  Maintain a healthy weight  Body mass index (BMI) is a measurement that can be used to identify possible weight problems. It estimates body fat based on height and weight. Your health care provider can help determine your BMI and help you achieve or maintain a healthy weight.  Get regular exercise  Get regular exercise. This is one of the most important things you can do for your health. Most adults should:  Exercise for at least 150 minutes each week. The exercise should increase your heart rate and make you sweat (moderate-intensity exercise).  Do strengthening exercises at least twice a week. This is in addition to the moderate-intensity exercise.  Spend less time sitting. Even light physical activity can be beneficial.  Watch cholesterol and blood lipids  Have your blood tested for lipids and cholesterol at 66 years of age, then have this test every 5 years.  You may need to have your cholesterol levels checked more often if:  Your lipid or cholesterol levels are high.  You are older than 66 years of age.  You are at high risk for heart disease.  What should I know about cancer screening?  Many types of cancers can be detected early and may often be prevented. Depending on your health history and family history, you may need to have cancer screening at various ages. This may include screening for:  Colorectal cancer.  Prostate cancer.  Skin cancer.  Lung  cancer.  What should I know about heart disease, diabetes, and high blood pressure?  Blood pressure and heart disease  High blood pressure causes heart disease and increases the risk of stroke. This is more likely to develop in people who have high blood pressure readings or are overweight.  Talk with your health care provider about your target blood pressure readings.  Have your blood pressure checked:  Every 3-5 years if you are 9-95 years of age.  Every year if you are 85 years old or older.  If you are between the ages of 29 and 29 and are a current or former smoker, ask your health care provider if you should have a one-time screening for abdominal aortic aneurysm (AAA).  Diabetes  Have regular diabetes screenings. This checks your fasting blood sugar level. Have the screening done:  Once every three years after age 23 if you are at a normal weight and have a low risk for diabetes.  More often and at a younger age if you are overweight or have a high risk for diabetes.  What should I know about preventing infection?  Hepatitis B  If you have a higher risk for hepatitis B, you should be screened for this virus. Talk with your health care provider to find out if you are at risk for hepatitis B infection.  Hepatitis C  Blood testing is recommended for:  Everyone born from [birth year] through 1963.  Anyone  with known risk factors for hepatitis C.  Sexually transmitted infections (STIs)  You should be screened each year for STIs, including gonorrhea and chlamydia, if:  You are sexually active and are younger than 66 years of age.  You are older than 66 years of age and your health care provider tells you that you are at risk for this type of infection.  Your sexual activity has changed since you were last screened, and you are at increased risk for chlamydia or gonorrhea. Ask your health care provider if you are at risk.  Ask your health care provider about whether you are at high risk for HIV. Your health care provider  may recommend a prescription medicine to help prevent HIV infection. If you choose to take medicine to prevent HIV, you should first get tested for HIV. You should then be tested every 3 months for as long as you are taking the medicine.  Follow these instructions at home:  Alcohol use  Do not drink alcohol if your health care provider tells you not to drink.  If you drink alcohol:  Limit how much you have to 0-2 drinks a day.  Know how much alcohol is in your drink. In the [place] one drink equals one 12 oz bottle of beer (355 mL), one 5 oz glass of wine (148 mL), or one 1 oz glass of hard liquor (44 mL).  Lifestyle  Do not use any products that contain nicotine or tobacco. These products include cigarettes, chewing tobacco, and vaping devices, such as e-cigarettes. If you need help quitting, ask your health care provider.  Do not use street drugs.  Do not share needles.  Ask your health care provider for help if you need support or information about quitting drugs.  General instructions  Schedule regular health, dental, and eye exams.  Stay current with your vaccines.  Tell your health care provider if:  You often feel depressed.  You have ever been abused or do not feel safe at home.  Summary  Adopting a healthy lifestyle and getting preventive care are important in promoting health and wellness.  Follow your health care provider's instructions about healthy diet, exercising, and getting tested or screened for diseases.  Follow your health care provider's instructions on monitoring your cholesterol and blood pressure.  This information is not intended to replace advice given to you by your health care provider. Make sure you discuss any questions you have with your health care provider.  Document Revised: 05/17/2020 Document Reviewed: 05/17/2020  Elsevier Patient Education  2024 [employer].

## 2023-03-09 ENCOUNTER — Encounter: Payer: Self-pay | Admitting: Internal Medicine

## 2023-03-10 DIAGNOSIS — R0609 Other forms of dyspnea: Secondary | ICD-10-CM | POA: Insufficient documentation

## 2023-03-10 MED ORDER — PITAVASTATIN CALCIUM 1 MG PO TABS: 1.0000 | ORAL_TABLET | Freq: Every day | ORAL | 1 refills | Status: DC

## 2023-03-25 ENCOUNTER — Other Ambulatory Visit: Payer: Self-pay | Admitting: Internal Medicine

## 2023-03-25 DIAGNOSIS — E785 Hyperlipidemia, unspecified: Secondary | ICD-10-CM

## 2023-03-26 ENCOUNTER — Other Ambulatory Visit: Payer: Self-pay | Admitting: Internal Medicine

## 2023-03-26 DIAGNOSIS — J410 Simple chronic bronchitis: Secondary | ICD-10-CM

## 2023-03-26 NOTE — Telephone Encounter (Unsigned)
 Copied from CRM [id number]. Topic: Clinical - Medication Refill >> Mar 26, 2023 12:48 PM Theodis Sato wrote: Most Recent Primary Care Visit:  Provider: [name]  Department[facility]  Visit Type: OFFICE VISIT  Date: 03/08/2023  Medication: Pitavastatin Calcium (LIVALO) 1 MG TABS  Has the patient contacted their pharmacy? Yes, pharmacy told patient he needs a new prescription before his insurance will pay for the new medication  (Agent: If no, request that the patient contact the pharmacy for the refill. If patient does not wish to contact the pharmacy document the reason why and proceed with request.) (Agent: If yes, when and what did the pharmacy advise?)  Is this the correct pharmacy for this prescription? Yes If no, delete pharmacy and type the correct one.  This is the patient's preferred pharmacy:  [id number] - [place], [place] - [address] AT [facility] [address] [place] [address] Phone: [phone] Fax: [phone]    Has the prescription been filled recently? Yes  Is the patient out of the medication? Yes have been for 4 days already  Has the patient been seen for an appointment in the last year OR does the patient have an upcoming appointment? Yes  Can we respond through MyChart? No  Agent: Please be advised that Rx refills may take up to 3 business days. We ask that you follow-up with your pharmacy.

## 2023-03-27 ENCOUNTER — Encounter: Payer: Self-pay | Admitting: Internal Medicine

## 2023-03-29 ENCOUNTER — Other Ambulatory Visit: Payer: Self-pay | Admitting: Internal Medicine

## 2023-03-29 DIAGNOSIS — F330 Major depressive disorder, recurrent, mild: Secondary | ICD-10-CM

## 2023-03-29 DIAGNOSIS — B20 Human immunodeficiency virus [HIV] disease: Secondary | ICD-10-CM

## 2023-03-29 DIAGNOSIS — F409 Phobic anxiety disorder, unspecified: Secondary | ICD-10-CM

## 2023-04-02 ENCOUNTER — Ambulatory Visit (HOSPITAL_COMMUNITY): Payer: 59 | Attending: Internal Medicine

## 2023-04-02 DIAGNOSIS — R011 Cardiac murmur, unspecified: Secondary | ICD-10-CM | POA: Diagnosis not present

## 2023-04-02 DIAGNOSIS — R9431 Abnormal electrocardiogram [ECG] [EKG]: Secondary | ICD-10-CM | POA: Diagnosis not present

## 2023-04-02 LAB — ECHOCARDIOGRAM COMPLETE
Area-P 1/2: 2.95 cm2
S' Lateral: 2.5 cm

## 2023-04-13 ENCOUNTER — Telehealth: Payer: Self-pay | Admitting: Internal Medicine

## 2023-04-13 NOTE — Telephone Encounter (Signed)
 Copied from CRM [id number]. Topic: Referral - Request for Referral >> Apr 13, 2023 12:57 PM [place] J wrote: Did the patient discuss referral with their provider in the last year? Yes (If No - schedule appointment) (If Yes - send message)  Appointment offered? Yes  Type of order/referral and detailed reason for visit:  GASTROENTEROLOGY  Patient initially had a referral to gastroenterology but had to deny appointment due to personal issues during the time. Patient is in a good place to try treatment and would like to get in for a colonoscopy.  Preference of office, provider, location: [facility]  If referral order, have you been seen by this specialty before? No (If Yes, this issue or another issue? When? Where?  Can we respond through MyChart? No

## 2023-04-19 NOTE — Telephone Encounter (Signed)
 Referral was ordered 2 months ago

## 2023-04-19 NOTE — Telephone Encounter (Signed)
 Please replace this referral.

## 2023-04-20 NOTE — Telephone Encounter (Signed)
 That referral was closed because he denied the appointment when they called. I reached out to them they said he needs a new referral.

## 2023-04-24 ENCOUNTER — Telehealth (HOSPITAL_COMMUNITY): Payer: Self-pay | Admitting: *Deleted

## 2023-04-24 ENCOUNTER — Other Ambulatory Visit: Payer: Self-pay | Admitting: Internal Medicine

## 2023-04-24 DIAGNOSIS — E1140 Type 2 diabetes mellitus with diabetic neuropathy, unspecified: Secondary | ICD-10-CM

## 2023-04-24 DIAGNOSIS — F409 Phobic anxiety disorder, unspecified: Secondary | ICD-10-CM

## 2023-04-24 DIAGNOSIS — F32A Depression, unspecified: Secondary | ICD-10-CM

## 2023-04-24 NOTE — Telephone Encounter (Signed)
 Reaching out to patient to offer assistance regarding upcoming cardiac imaging study; pt verbalizes understanding of appt date/time, parking situation and where to check in, pre-test NPO status, and verified current allergies; name and call back number provided for further questions should they arise  Chase Copping RN Navigator Cardiac Imaging [facility] [phone] office [phone] cell  Patient to take his daily medication. He is aware to arrive at 12 PM.

## 2023-04-25 ENCOUNTER — Ambulatory Visit (HOSPITAL_COMMUNITY)
Admission: RE | Admit: 2023-04-25 | Discharge: 2023-04-25 | Disposition: A | Discharge status: Home / Self Care | Source: Ambulatory Visit | Attending: Internal Medicine | Admitting: Internal Medicine

## 2023-04-25 DIAGNOSIS — R9431 Abnormal electrocardiogram [ECG] [EKG]: Secondary | ICD-10-CM | POA: Insufficient documentation

## 2023-04-25 DIAGNOSIS — R0609 Other forms of dyspnea: Secondary | ICD-10-CM | POA: Diagnosis not present

## 2023-04-25 DIAGNOSIS — I2510 Atherosclerotic heart disease of native coronary artery without angina pectoris: Secondary | ICD-10-CM

## 2023-04-25 DIAGNOSIS — R931 Abnormal findings on diagnostic imaging of heart and coronary circulation: Secondary | ICD-10-CM | POA: Insufficient documentation

## 2023-04-25 MED ORDER — NITROGLYCERIN 0.4 MG SL SUBL
0.8000 mg | SUBLINGUAL_TABLET | Freq: Once | SUBLINGUAL | Status: AC
  Administered 2023-04-25: 0.8 mg via SUBLINGUAL

## 2023-04-25 MED ORDER — IOHEXOL 350 MG/ML SOLN
95.0000 mL | Freq: Once | INTRAVENOUS | Status: AC | PRN
  Administered 2023-04-25: 95 mL via INTRAVENOUS

## 2023-04-25 MED ORDER — NITROGLYCERIN 0.4 MG SL SUBL
SUBLINGUAL_TABLET | SUBLINGUAL | Status: AC
  Filled 2023-04-25: qty 2

## 2023-04-26 ENCOUNTER — Other Ambulatory Visit: Payer: Self-pay | Admitting: Cardiology

## 2023-04-26 ENCOUNTER — Other Ambulatory Visit: Payer: Self-pay | Admitting: Internal Medicine

## 2023-04-26 ENCOUNTER — Ambulatory Visit (HOSPITAL_BASED_OUTPATIENT_CLINIC_OR_DEPARTMENT_OTHER)
Admission: RE | Admit: 2023-04-26 | Discharge: 2023-04-26 | Disposition: A | Discharge status: Home / Self Care | Source: Ambulatory Visit | Attending: Cardiology | Admitting: Cardiology

## 2023-04-26 DIAGNOSIS — R931 Abnormal findings on diagnostic imaging of heart and coronary circulation: Secondary | ICD-10-CM

## 2023-04-26 DIAGNOSIS — I2510 Atherosclerotic heart disease of native coronary artery without angina pectoris: Secondary | ICD-10-CM | POA: Insufficient documentation

## 2023-04-26 DIAGNOSIS — R0609 Other forms of dyspnea: Secondary | ICD-10-CM | POA: Diagnosis not present

## 2023-04-26 DIAGNOSIS — R9431 Abnormal electrocardiogram [ECG] [EKG]: Secondary | ICD-10-CM | POA: Diagnosis not present

## 2023-04-26 DIAGNOSIS — E785 Hyperlipidemia, unspecified: Secondary | ICD-10-CM

## 2023-04-26 DIAGNOSIS — I739 Peripheral vascular disease, unspecified: Secondary | ICD-10-CM

## 2023-04-26 MED ORDER — ASPIRIN 81 MG PO TBEC: 81.0000 mg | DELAYED_RELEASE_TABLET | Freq: Every day | ORAL | 1 refills | Status: DC

## 2023-04-26 MED ORDER — PITAVASTATIN CALCIUM 1 MG PO TABS: 1.0000 | ORAL_TABLET | Freq: Every day | ORAL | 1 refills | Status: DC

## 2023-04-27 ENCOUNTER — Other Ambulatory Visit: Payer: Self-pay | Admitting: Internal Medicine

## 2023-04-27 LAB — POCT I-STAT CREATININE: Creatinine, Ser: 1.1 mg/dL (ref 0.61–1.24)

## 2023-04-27 NOTE — Telephone Encounter (Unsigned)
 Copied from CRM [id number]. Topic: Clinical - Medication Refill >> Apr 27, 2023 10:21 AM Nicholas Whitehead H wrote: Most Recent Primary Care Visit:  Provider: Arcadio Knuckles  Department: [facility]  Visit Type: OFFICE VISIT  Date: 03/08/2023  Medication: traZODone  (DESYREL ) 150 MG tablet [[id number]]   Has the patient contacted their pharmacy? No (Agent: If no, request that the patient contact the pharmacy for the refill. If patient does not wish to contact the pharmacy document the reason why and proceed with request.) (Agent: If yes, when and what did the pharmacy advise?)  Is this the correct pharmacy for this prescription? Yes If no, delete pharmacy and type the correct one.  This is the patient's preferred pharmacy:    [facility] [place] - [address] [place] [address] Phone: [phone] Fax: [phone]   Has the prescription been filled recently? No  Is the patient out of the medication? Yes  Has the patient been seen for an appointment in the last year OR does the patient have an upcoming appointment? Yes  Can we respond through MyChart? Yes  Agent: Please be advised that Rx refills may take up to 3 business days. We ask that you follow-up with your pharmacy.

## 2023-05-02 ENCOUNTER — Encounter: Payer: Self-pay | Admitting: Podiatry

## 2023-05-02 ENCOUNTER — Ambulatory Visit (INDEPENDENT_AMBULATORY_CARE_PROVIDER_SITE_OTHER): Payer: 59 | Admitting: Podiatry

## 2023-05-02 DIAGNOSIS — E1140 Type 2 diabetes mellitus with diabetic neuropathy, unspecified: Secondary | ICD-10-CM | POA: Diagnosis not present

## 2023-05-02 DIAGNOSIS — L84 Corns and callosities: Secondary | ICD-10-CM | POA: Diagnosis not present

## 2023-05-02 DIAGNOSIS — M79674 Pain in right toe(s): Secondary | ICD-10-CM

## 2023-05-02 DIAGNOSIS — Z794 Long term (current) use of insulin: Secondary | ICD-10-CM | POA: Diagnosis not present

## 2023-05-02 DIAGNOSIS — B351 Tinea unguium: Secondary | ICD-10-CM | POA: Diagnosis not present

## 2023-05-02 DIAGNOSIS — M79675 Pain in left toe(s): Secondary | ICD-10-CM

## 2023-05-02 DIAGNOSIS — Z89419 Acquired absence of unspecified great toe: Secondary | ICD-10-CM | POA: Diagnosis not present

## 2023-05-03 ENCOUNTER — Other Ambulatory Visit: Payer: Self-pay | Admitting: Internal Medicine

## 2023-05-03 DIAGNOSIS — E1140 Type 2 diabetes mellitus with diabetic neuropathy, unspecified: Secondary | ICD-10-CM

## 2023-05-03 DIAGNOSIS — F409 Phobic anxiety disorder, unspecified: Secondary | ICD-10-CM

## 2023-05-03 DIAGNOSIS — F32A Depression, unspecified: Secondary | ICD-10-CM

## 2023-05-03 MED ORDER — TRAZODONE HCL 150 MG PO TABS: 450.0000 mg | ORAL_TABLET | Freq: Every day | ORAL | 0 refills | Status: DC

## 2023-05-03 MED ORDER — GABAPENTIN 300 MG PO CAPS: 300.0000 mg | ORAL_CAPSULE | Freq: Three times a day (TID) | ORAL | 0 refills | Status: DC

## 2023-05-03 NOTE — Telephone Encounter (Signed)
 Copied from CRM [id number]. Topic: Clinical - Medication Refill >> Apr 27, 2023 10:21 AM Felecia Hopper H wrote: Most Recent Primary Care Visit:  Provider: Arcadio Knuckles  Department: [facility]  Visit Type: OFFICE VISIT  Date: 03/08/2023  Medication: traZODone  (DESYREL ) 150 MG tablet [[id number]]   Has the patient contacted their pharmacy? No (Agent: If no, request that the patient contact the pharmacy for the refill. If patient does not wish to contact the pharmacy document the reason why and proceed with request.) (Agent: If yes, when and what did the pharmacy advise?)  Is this the correct pharmacy for this prescription? Yes If no, delete pharmacy and type the correct one.  This is the patient's preferred pharmacy:    [facility] [place] [address] [place] [address] Phone: [phone] Fax: [phone]   Has the prescription been filled recently? No  Is the patient out of the medication? Yes  Has the patient been seen for an appointment in the last year OR does the patient have an upcoming appointment? Yes  Can we respond through MyChart? Yes  Agent: Please be advised that Rx refills may take up to 3 business days. We ask that you follow-up with your pharmacy.

## 2023-05-06 NOTE — Progress Notes (Signed)
  Subjective:  Patient ID: Nicholas Whitehead, male    DOB: Apr 09, 1955,  MRN: [id number]  Nicholas Whitehead presents to clinic today for at risk foot care. Patient has h/o NIDDM, neuropathy with amputation of partial amputation of L hallux. He has had flexor tenotomies performed by Dr. Michalene Agee. Chief Complaint  Patient presents with   Diabetes    Patient knows who his PCP is and states his name is Sandra Crouch, patient states he last saw him was in January and his A1c was 6.8   New problem(s): None.   PCP is Arcadio Knuckles, MD.  Allergies  Allergen Reactions   Benazepril  Cough   Molds & Smuts     Hard to breathe   Pravastatin  Other (See Comments)    myositis   Glipizide Nausea Only    Review of Systems: Negative except as noted in the HPI.  Objective: No changes noted in today's physical examination. There were no vitals filed for this visit. Nicholas Whitehead is a pleasant 68 y.o. male WD, WN in NAD. AAO x 3.  Vascular Examination: CFT <3 seconds b/l. DP pulses faintly palpable b/l. PT pulses palpable b/l. Digital hair absent. Skin temperature gradient warm to warm b/l. No pain with calf compression. No ischemia or gangrene. No cyanosis or clubbing noted b/l.    Neurological Examination: Pt has subjective symptoms of neuropathy. Protective sensation diminished with 10g monofilament b/l.  Dermatological Examination: Pedal skin warm and supple b/l. No open wounds. No interdigital macerations. Toenails 1-5 b/l thick, discolored, elongated with subungual debris and pain on dorsal palpation.  Preulcerative lesion noted bilateral heels and L 2nd toe. There is visible subdermal hemorrhage. There is no surrounding erythema, no edema, no drainage, no odor, no fluctuance.  Musculoskeletal Examination: Muscle strength 5/5 to all lower extremity muscle groups bilaterally. Lower extremity amputation(s): partial amputation of left great toe. Hammertoe deformity noted 2-5 b/l.  Radiographs:  None  Last HgA1c:      Latest Ref Rng & Units 03/02/2023   10:02 AM 11/29/2022    9:50 AM 08/07/2022   10:46 AM  Hemoglobin A1C  Hemoglobin-A1c <5.7 % of total Hgb 7.7  8.1  7.8    Assessment/Plan: 1. Pain due to onychomycosis of toenails of both feet   2. Pre-ulcerative corn or callous   3. History of amputation of great toe (HCC)   4. Type 2 diabetes mellitus with diabetic neuropathy, with long-term current use of insulin  (HCC)   -Consent given for treatment as described below: -Examined patient. -Mycotic toenails 2-5 bilaterally and right great toe were debrided in length and girth with sterile nail nippers and dremel without iatrogenic bleeding. -Preulcerative lesion pared bilateral heels and L 2nd toe utilizing sterile scalpel blade. Total number pared=3. -Patient/POA to call should there be question/concern in the interim.   No follow-ups on file.  Nicholas Whitehead, DPM      [facility] LOCATION: [address] [place] [address]                   Office [phone]   [facility] LOCATION: [address] [place], [place] [address] Office [phone]

## 2023-05-11 ENCOUNTER — Other Ambulatory Visit: Payer: Self-pay | Admitting: Internal Medicine

## 2023-05-11 DIAGNOSIS — E1165 Type 2 diabetes mellitus with hyperglycemia: Secondary | ICD-10-CM

## 2023-05-11 NOTE — Telephone Encounter (Unsigned)
 Copied from CRM [id number]. Topic: Clinical - Medication Refill >> May 11, 2023  2:21 PM Danae Duncans wrote: Most Recent Primary Care Visit:  Provider: Arcadio Knuckles  Department: [facility]  Visit Type: OFFICE VISIT  Date: 03/08/2023  Medication: HUMALOG  KWIKPEN 100 UNIT/ML  Has the patient contacted their pharmacy? Yes, pharmacy advise there is nomore refills (Agent: If no, request that the patient contact the pharmacy for the refill. If patient does not wish to contact the pharmacy document the reason why and proceed with request.) (Agent: If yes, when and what did the pharmacy advise?)  Is this the correct pharmacy for this prescription? Yes If no, delete pharmacy and type the correct one.  This is the patient's preferred pharmacy:   [facility] [place] [address] [place] [address] Phone: [phone] Fax: [phone]   Has the prescription been filled recently? No  Is the patient out of the medication? Yes  Has the patient been seen for an appointment in the last year OR does the patient have an upcoming appointment? Yes  Can we respond through MyChart? No  Agent: Please be advised that Rx refills may take up to 3 business days. We ask that you follow-up with your pharmacy.

## 2023-05-14 NOTE — Telephone Encounter (Signed)
 Last Fill: 01/17/23  Last OV: 03/08/23 Next OV: 02/06/24 AWV  Routing to provider for review/authorization.

## 2023-05-16 ENCOUNTER — Other Ambulatory Visit: Payer: Self-pay

## 2023-05-17 MED ORDER — HUMALOG KWIKPEN 100 UNIT/ML ~~LOC~~ SOPN: PEN_INJECTOR | SUBCUTANEOUS | 1 refills | Status: DC

## 2023-05-18 ENCOUNTER — Other Ambulatory Visit: Payer: Self-pay | Admitting: Internal Medicine

## 2023-05-18 DIAGNOSIS — F409 Phobic anxiety disorder, unspecified: Secondary | ICD-10-CM

## 2023-05-18 DIAGNOSIS — F32A Depression, unspecified: Secondary | ICD-10-CM

## 2023-05-18 DIAGNOSIS — J410 Simple chronic bronchitis: Secondary | ICD-10-CM

## 2023-05-18 DIAGNOSIS — E1140 Type 2 diabetes mellitus with diabetic neuropathy, unspecified: Secondary | ICD-10-CM

## 2023-05-18 DIAGNOSIS — F5105 Insomnia due to other mental disorder: Secondary | ICD-10-CM

## 2023-05-18 DIAGNOSIS — I10 Essential (primary) hypertension: Secondary | ICD-10-CM

## 2023-05-18 NOTE — Telephone Encounter (Unsigned)
 Copied from CRM [id number]. Topic: Clinical - Medication Refill >> May 18, 2023 10:25 AM Tanazia G wrote: Medication:  Suvorexant  (BELSOMRA ) 20 MG TABS   traZODone  (DESYREL ) 150 MG tablet  gabapentin  (NEURONTIN ) 300 MG capsule  DULoxetine  (CYMBALTA ) 60 MG capsule  olmesartan  (BENICAR ) 40 MG tablet  albuterol  (VENTOLIN  HFA) 108 (90 Base) MCG/ACT inhaler  dapagliflozin  propanediol (FARXIGA ) 10 MG TABS tablet     Has the patient contacted their pharmacy? Yes (Agent: If no, request that the patient contact the pharmacy for the refill. If patient does not wish to contact the pharmacy document the reason why and proceed with request.) (Agent: If yes, when and what did the pharmacy advise?)  This is the patient's preferred pharmacy:   [facility] [place] [address] [place] [address] Phone: [phone] Fax: [phone]  Is this the correct pharmacy for this prescription? Yes If no, delete pharmacy and type the correct one.   Has the prescription been filled recently? Yes  Is the patient out of the medication? Yes  Has the patient been seen for an appointment in the last year OR does the patient have an upcoming appointment? Yes  Can we respond through MyChart? Yes  Agent: Please be advised that Rx refills may take up to 3 business days. We ask that you follow-up with your pharmacy.

## 2023-05-21 ENCOUNTER — Other Ambulatory Visit: Payer: Self-pay | Admitting: Internal Medicine

## 2023-05-21 DIAGNOSIS — E1140 Type 2 diabetes mellitus with diabetic neuropathy, unspecified: Secondary | ICD-10-CM

## 2023-05-21 DIAGNOSIS — F32A Depression, unspecified: Secondary | ICD-10-CM

## 2023-05-22 MED ORDER — GABAPENTIN 300 MG PO CAPS: 300.0000 mg | ORAL_CAPSULE | Freq: Three times a day (TID) | ORAL | 0 refills | Status: DC

## 2023-05-22 MED ORDER — BELSOMRA 20 MG PO TABS: 20.0000 mg | ORAL_TABLET | Freq: Every evening | ORAL | 0 refills | Status: DC

## 2023-05-22 MED ORDER — TRAZODONE HCL 150 MG PO TABS: 450.0000 mg | ORAL_TABLET | Freq: Every day | ORAL | 0 refills | Status: DC

## 2023-05-22 MED ORDER — DULOXETINE HCL 60 MG PO CPEP: ORAL_CAPSULE | ORAL | 0 refills | Status: DC

## 2023-05-22 MED ORDER — OLMESARTAN MEDOXOMIL 40 MG PO TABS: ORAL_TABLET | ORAL | 0 refills | Status: DC

## 2023-05-22 MED ORDER — DAPAGLIFLOZIN PROPANEDIOL 10 MG PO TABS: 10.0000 mg | ORAL_TABLET | Freq: Every day | ORAL | 0 refills | Status: DC

## 2023-05-22 MED ORDER — ALBUTEROL SULFATE HFA 108 (90 BASE) MCG/ACT IN AERS: 2.0000 | INHALATION_SPRAY | Freq: Four times a day (QID) | RESPIRATORY_TRACT | 3 refills | Status: DC | PRN

## 2023-05-24 ENCOUNTER — Other Ambulatory Visit: Payer: Self-pay | Admitting: Internal Medicine

## 2023-05-24 NOTE — Progress Notes (Signed)
 The 10-year ASCVD risk score (Arnett DK, et al., 2019) is: 33.3%   Values used to calculate the score:     Age: 68 years     Sex: Male     Is Non-Hispanic African American: Yes     Diabetic: Yes     Tobacco smoker: Yes     Systolic Blood Pressure: 113 mmHg     Is BP treated: Yes     HDL Cholesterol: 50.5 mg/dL     Total Cholesterol: 137 mg/dL  Currently prescribed pitavastatin  1 mg.   Jeremia Groot, BSN, RN

## 2023-05-24 NOTE — Telephone Encounter (Unsigned)
 Copied from CRM [id number]. Topic: Clinical - Medication Refill >> May 24, 2023  9:40 AM Shereese L wrote: Medication: traZODone  (DESYREL ) 150 MG tablet gabapentin  (NEURONTIN ) 300 MG capsule  Has the patient contacted their pharmacy? Yes (Agent: If no, request that the patient contact the pharmacy for the refill. If patient does not wish to contact the pharmacy document the reason why and proceed with request.) (Agent: If yes, when and what did the pharmacy advise?)  This is the patient's preferred pharmacy:   [facility] [place] [address] [place] [address] Phone: [phone] Fax: [phone]  Is this the correct pharmacy for this prescription? Yes If no, delete pharmacy and type the correct one.   Has the prescription been filled recently? No  Is the patient out of the medication? Yes  Has the patient been seen for an appointment in the last year OR does the patient have an upcoming appointment? No  Can we respond through MyChart? Yes  Agent: Please be advised that Rx refills may take up to 3 business days. We ask that you follow-up with your pharmacy.

## 2023-05-29 ENCOUNTER — Other Ambulatory Visit: Payer: Self-pay | Admitting: Internal Medicine

## 2023-05-29 DIAGNOSIS — E119 Type 2 diabetes mellitus without complications: Secondary | ICD-10-CM

## 2023-06-05 ENCOUNTER — Other Ambulatory Visit: Payer: 59

## 2023-06-05 DIAGNOSIS — Z794 Long term (current) use of insulin: Secondary | ICD-10-CM | POA: Diagnosis not present

## 2023-06-05 DIAGNOSIS — E1165 Type 2 diabetes mellitus with hyperglycemia: Secondary | ICD-10-CM | POA: Diagnosis not present

## 2023-06-06 ENCOUNTER — Ambulatory Visit: Payer: Self-pay | Admitting: Endocrinology

## 2023-06-06 LAB — BASIC METABOLIC PANEL WITH GFR
BUN: 16 mg/dL (ref 7–25)
CO2: 30 mmol/L (ref 20–32)
Calcium: 10.4 mg/dL — ABNORMAL HIGH (ref 8.6–10.3)
Chloride: 102 mmol/L (ref 98–110)
Creat: 1.23 mg/dL (ref 0.70–1.35)
Glucose, Bld: 134 mg/dL — ABNORMAL HIGH (ref 65–99)
Potassium: 4.8 mmol/L (ref 3.5–5.3)
Sodium: 142 mmol/L (ref 135–146)
eGFR: 65 mL/min/{1.73_m2} (ref >=60)

## 2023-06-06 LAB — HEMOGLOBIN A1C
Hgb A1c MFr Bld: 7.5 % — ABNORMAL HIGH (ref <5.7)
Mean Plasma Glucose: 169 mg/dL
eAG (mmol/L): 9.3 mmol/L

## 2023-06-07 ENCOUNTER — Ambulatory Visit (INDEPENDENT_AMBULATORY_CARE_PROVIDER_SITE_OTHER): Payer: 59 | Admitting: Endocrinology

## 2023-06-07 ENCOUNTER — Encounter: Payer: Self-pay | Admitting: Endocrinology

## 2023-06-07 DIAGNOSIS — E1165 Type 2 diabetes mellitus with hyperglycemia: Secondary | ICD-10-CM | POA: Diagnosis not present

## 2023-06-07 DIAGNOSIS — Z794 Long term (current) use of insulin: Secondary | ICD-10-CM | POA: Diagnosis not present

## 2023-06-07 DIAGNOSIS — E1140 Type 2 diabetes mellitus with diabetic neuropathy, unspecified: Secondary | ICD-10-CM | POA: Diagnosis not present

## 2023-06-07 MED ORDER — DAPAGLIFLOZIN PROPANEDIOL 10 MG PO TABS: 10.0000 mg | ORAL_TABLET | Freq: Every day | ORAL | 3 refills | Status: DC

## 2023-06-07 MED ORDER — HUMALOG KWIKPEN 100 UNIT/ML ~~LOC~~ SOPN: PEN_INJECTOR | SUBCUTANEOUS | 4 refills | Status: DC

## 2023-06-07 MED ORDER — FREESTYLE LIBRE 3 PLUS SENSOR MISC: 1.0000 | 4 refills | Status: AC

## 2023-06-07 MED ORDER — TOUJEO SOLOSTAR 300 UNIT/ML ~~LOC~~ SOPN: 35.0000 [IU] | PEN_INJECTOR | Freq: Two times a day (BID) | SUBCUTANEOUS | 4 refills | Status: DC

## 2023-06-07 MED ORDER — PIOGLITAZONE HCL 30 MG PO TABS: ORAL_TABLET | ORAL | 3 refills | Status: DC

## 2023-06-07 NOTE — Progress Notes (Signed)
 Outpatient Endocrinology Note Nicholas Lamari Youngers, MD  06/07/23  Patient's Name: Nicholas Whitehead    DOB: [birth year]    MRN: [id number]                                                    REASON OF VISIT: Follow up for type 2 diabetes mellitus  PCP: Arcadio Knuckles, MD  HISTORY OF PRESENT ILLNESS:   [name] is a 68 y.o. old male with past medical history listed below, is here for follow up for type 2 diabetes mellitus.   Pertinent Diabetes History: Patient was diagnosed with type 2 diabetes mellitus in 1998.  Insulin  therapy was started around 2006. He requires large doses of insulin  especially basal insulin .  Chronic Diabetes Complications : Retinopathy: no. Last ophthalmology exam was done on 08/2022, annually, following with ophthalmology regularly.  Nephropathy: no, on ACE/ARB /olmesartan . Peripheral neuropathy: yes, on Has had paresthesiae in feet and legs, these are treated with gabapentin  300 mg 3 times a day.  He follows up with podiatry as well. Coronary artery disease: no Stroke: no  Relevant comorbidities and cardiovascular risk factors: Obesity: no Body mass index is 25.12 kg/m.  Hypertension: Yes  Hyperlipidemia : Yes, on statin   Current / Home Diabetic regimen includes:   Insulin  regimen: Toujeo : 35 units bid; Humalog  25-30 units before meals  three times a day.        Oral hypoglycemic drugs: Actos  30 mg, Ozempic  0.5 weekly, Farxiga  10mg  daily.    Prior diabetic medications: Invokana  in the past, increase urgency and frequency of urination.  Had nausea with Ozempic  1 mg.  Glycemic data:    Patient has not been using freestyle libre 3 CGM lately.  Accu-Chek guide glucometer download from May 15 to Jun 07, 2023 average blood sugar 131, he has been checking blood sugar 3-4 times a day, highest blood sugar 176 and lowest blood sugar 86.  Mostly acceptable blood sugar, fasting blood sugar in the range of 109-151.  Blood sugar in the afternoon and at bedtime mostly  acceptable, no hypoglycemia and no concerning hyperglycemia.  Hypoglycemia: Patient has no hypoglycemic episodes. Patient has hypoglycemia awareness.  Factors modifying glucose control: 1.  Diabetic diet assessment: 3 meals a day.  Usually avoiding high-fat meals.  2.  Staying active or exercising: No formal exercise.  3.  Medication compliance: compliant most of the time.  Interval history  Hemoglobin A1c slightly improved to 7.5%.  Glucometer data as reviewed above, mostly acceptable blood sugar with average blood sugar of 131.  Diabetes regimen as reviewed above.  He denies any GI issues on taking Ozempic .  No other complaints today.  He has not been using freestyle libre CGM, he has problem with adhesive, advised to try with extra tape Tegaderm.  REVIEW OF SYSTEMS As per history of present illness.   PAST MEDICAL HISTORY: Past Medical History:  Diagnosis Date   Anxiety    Cataract    right    Depression    Diabetes mellitus without complication (HCC)    DM (diabetes mellitus screen)    HIV disease (HCC) 09/12/1995   HIV infection (HCC)    Hypercholesterolemia    Hyperlipidemia    Hypertension     PAST SURGICAL HISTORY: Past Surgical History:  Procedure Laterality Date   COLONOSCOPY WITH PROPOFOL   01/16/2012   Procedure: COLONOSCOPY WITH PROPOFOL ;  Surgeon: Garrett Kallman, MD;  Location: [facility];  Service: Endoscopy;  Laterality: N/A;   EYE SURGERY  01/10/2003   catarct surgery   foot sugery Bilateral 12/29/2022   optic lens surgery  01/10/2003    ALLERGIES: Allergies  Allergen Reactions   Benazepril  Cough   Molds & Smuts     Hard to breathe   Pravastatin  Other (See Comments)    myositis   Glipizide Nausea Only    FAMILY HISTORY:  Family History  Problem Relation Age of Onset   Arthritis Mother    Diabetes Mother    Heart disease Mother    Stroke Mother    Arthritis Father    Diabetes Father    Heart disease Father     SOCIAL  HISTORY: Social History   Socioeconomic History   Marital status: Single    Spouse name: Not on file   Number of children: Not on file   Years of education: Not on file   Highest education level: Not on file  Occupational History   Occupation: disability  Tobacco Use   Smoking status: Every Day    Current packs/day: 0.25    Types: Cigarettes   Smokeless tobacco: Never   Tobacco comments:    Occ smoking/ trying to quit  Vaping Use   Vaping status: Never Used  Substance and Sexual Activity   Alcohol  use: No    Alcohol /week: 0.0 standard drinks of alcohol     Comment: socially   Drug use: No   Sexual activity: Never    Birth control/protection: Other-see comments    Comment: declined condoms  Other Topics Concern   Not on file  Social History Narrative   Lives alone   Social Drivers of Health   Financial Resource Strain: Medium Risk (02/05/2023)   Overall Financial Resource Strain (CARDIA)    Difficulty of Paying Living Expenses: Somewhat hard  Food Insecurity: Food Insecurity Present (02/05/2023)   Hunger Vital Sign    Worried About Running Out of Food in the Last Year: Sometimes true    Ran Out of Food in the Last Year: Sometimes true  Transportation Needs: No Transportation Needs (02/05/2023)   PRAPARE - Administrator, Civil Service (Medical): No    Lack of Transportation (Non-Medical): No  Physical Activity: Inactive (02/05/2023)   Exercise Vital Sign    Days of Exercise per Week: 0 days    Minutes of Exercise per Session: 0 min  Stress: No Stress Concern Present (02/05/2023)   [employer] of Occupational Health - Occupational Stress Questionnaire    Feeling of Stress : Only a little  Social Connections: Unknown (02/05/2023)   Social Connection and Isolation Panel [NHANES]    Frequency of Communication with Friends and Family: More than three times a week    Frequency of Social Gatherings with Friends and Family: Never    Attends Religious  Services: Not on Marketing executive or Organizations: Not on file    Attends Banker Meetings: Not on file    Marital Status: Never married    MEDICATIONS:  Current Outpatient Medications  Medication Sig Dispense Refill   ACCU-CHEK GUIDE TEST test strip TABLET BLOOD SUGAR FOUR TIMES DAILY 400 strip 1   albuterol  (VENTOLIN  HFA) 108 (90 Base) MCG/ACT inhaler Inhale 2 puffs into the lungs every 6 (six) hours as needed for wheezing or shortness of breath. 6.7 g 3  Alcohol  Swabs  (B-D SINGLE USE SWABS  REGULAR) PADS USE AS DIRECTED 300 each 1   aspirin  EC 81 MG tablet Take 1 tablet (81 mg total) by mouth daily. 90 tablet 1   Blood Glucose Monitoring Suppl (ACCU-CHEK GUIDE) w/Device KIT Use to check blood sugars four times a day 1 kit 0   cetirizine (ZYRTEC) 10 MG tablet Take 10 mg by mouth daily.     Cholecalciferol  2000 units TABS Take 1 tablet (2,000 Units total) by mouth daily. 90 tablet 1   Continuous Glucose Sensor (FREESTYLE LIBRE 3 PLUS SENSOR) MISC 1 each by Does not apply route continuous. Change every 15 days. 6 each 4   dapagliflozin  propanediol (FARXIGA ) 10 MG TABS tablet Take 1 tablet (10 mg total) by mouth daily. 90 tablet 3   DULoxetine  (CYMBALTA ) 60 MG capsule TAKE ONE CAPSULE (60 MG) BY MOUTH DAILY AT 9AM 90 capsule 0   gabapentin  (NEURONTIN ) 300 MG capsule Take 1 capsule (300 mg total) by mouth 3 (three) times daily. 270 capsule 0   Glucagon  (GVOKE HYPOPEN  2-PACK) 1 MG/0.2ML SOAJ Inject 1 Act into the skin daily as needed. 2 mL 6   Glucagon , rDNA, (GLUCAGON  EMERGENCY) 1 MG KIT Inject 1 mg into the skin once for 1 dose. 1 kit 5   Glycopyrrolate -Formoterol  (BEVESPI  AEROSPHERE) 9-4.8 MCG/ACT AERO Inhale 2 puffs into the lungs 2 (two) times daily. 32.1 g 1   HUMALOG  KWIKPEN 100 UNIT/ML KwikPen ADMINISTER 25-30 UNITS UNDER THE SKIN THREE TIMES DAILY 45 mL 4   Insulin  Pen Needle (B-D ULTRAFINE III SHORT PEN) 31G X 8 MM MISC USE TO INJECT INSULIN  AS DIRECTED  THREE TIMES DAILY PER HUMALOG  500 each 1   Lancets MISC Inject 1 Act into the skin 4 (four) times daily. Use to test blood sugar tid. DX: E11.8 300 each 3   mirtazapine  (REMERON ) 7.5 MG tablet TAKE ONE TABLET BY MOUTH DAILY AT 9PM 90 tablet 11   mupirocin  ointment (BACTROBAN ) 2 % Apply 1 Application topically 2 (two) times daily. 30 g 2   Nebivolol  HCl 20 MG TABS TAKE 1 TABLET(20 MG) BY MOUTH DAILY 90 tablet 1   ODEFSEY  200-25-25 MG TABS tablet TAKE ONE TABLET BY MOUTH DAILY 90 tablet 1   olmesartan  (BENICAR ) 40 MG tablet TAKE ONE TABLET (40 MG) BY MOUTH DAILY 90 tablet 0   pioglitazone  (ACTOS ) 30 MG tablet TAKE 1 TABLET(30 MG) BY MOUTH DAILY 90 tablet 3   Pitavastatin  Calcium  (LIVALO ) 1 MG TABS Take 1 tablet (1 mg total) by mouth daily. TAKE 1 TABLET(1 MG) BY MOUTH DAILY 90 tablet 1   Semaglutide ,0.25 or 0.5MG /DOS, (OZEMPIC , 0.25 OR 0.5 MG/DOSE,) 2 MG/3ML SOPN Inject 0.5 mg into the skin once a week. 9 mL 3   Suvorexant  (BELSOMRA ) 20 MG TABS Take 1 tablet (20 mg total) by mouth at bedtime. 90 tablet 0   torsemide  (DEMADEX ) 20 MG tablet TAKE ONE TABLET BY MOUTH ON MONDAY, WEDNESDAY AND FRIDAY 36 tablet 2   TOUJEO  SOLOSTAR 300 UNIT/ML Solostar Pen Inject 35 Units into the skin in the morning and at bedtime. INJECT 25-30 UNITS UNDER THE SKIN 2 times a day. 12 mL 4   traZODone  (DESYREL ) 150 MG tablet Take 3 tablets (450 mg total) by mouth at bedtime. 270 tablet 0   No current facility-administered medications for this visit.    PHYSICAL EXAM: Vitals:   06/07/23 1020  BP: 128/60  Pulse: 69  Resp: 20  SpO2: 99%  Weight: 185 lb 3.2  oz (84 kg)  Height: 6' (1.829 m)    Body mass index is 25.12 kg/m.  Wt Readings from Last 3 Encounters:  06/07/23 185 lb 3.2 oz (84 kg)  03/08/23 190 lb (86.2 kg)  03/08/23 190 lb 6.4 oz (86.4 kg)    General: Well developed, well nourished male in no apparent distress.  HEENT: AT/[place], no external lesions.  Eyes: Conjunctiva clear and no icterus. Neck: Neck  supple  Lungs: Respirations not labored Neurologic: Alert, oriented, normal speech Extremities / Skin: Dry.   Psychiatric: Does not appear depressed or anxious  Diabetic Foot Exam - Simple   No data filed    LABS Reviewed Lab Results  Component Value Date   HGBA1C 7.5 (H) 06/05/2023   HGBA1C 7.7 (H) 03/02/2023   HGBA1C 8.1 (H) 11/29/2022   Lab Results  Component Value Date   FRUCTOSAMINE 316 (H) 03/10/2021   FRUCTOSAMINE 367 (H) 02/01/2021   FRUCTOSAMINE 280 09/11/2019   Lab Results  Component Value Date   CHOL 137 03/08/2023   HDL 50.50 03/08/2023   LDLCALC 58 03/08/2023   TRIG 143.0 03/08/2023   CHOLHDL 3 03/08/2023   Lab Results  Component Value Date   MICRALBCREAT 0.9 11/29/2022   MICRALBCREAT 0.8 07/27/2022   Lab Results  Component Value Date   CREATININE 1.23 06/05/2023   Lab Results  Component Value Date   GFR 60.61 11/29/2022    ASSESSMENT / PLAN  1. Uncontrolled type 2 diabetes mellitus with hyperglycemia, with long-term current use of insulin  (HCC)   2. Type 2 diabetes mellitus with diabetic neuropathy, with long-term current use of insulin  (HCC)      Diabetes Mellitus type 2, complicated by diabetic neuropathy. - Diabetic status / severity: Uncontrolled.  Lab Results  Component Value Date   HGBA1C 7.5 (H) 06/05/2023    - Hemoglobin A1c goal : <6.5%  Discussed about increasing dose of Ozempic , he wants to stay on the current dose.  He is concerned about potential GI side effects.  - Medications: No change.  Insulin  regimen: Toujeo : 35 units bid; Humalog  25-30 units before meals 3 times a day.         Oral hypoglycemic drugs: Actos  30 mg, Ozempic  0.5 weekly, Farxiga  10mg  daily.  - Home glucose testing: Before meals and at bedtime and asked to try freestyle libre 3+ CGM with extra tape.   - Discussed/ Gave Hypoglycemia treatment plan.  # Consult : not required at this time.   # Annual urine for microalbuminuria/ creatinine ratio, no  microalbuminuria currently, continue ACE/ARB /olmesartan . Last  Lab Results  Component Value Date   MICRALBCREAT 0.9 11/29/2022    # Foot check nightly / neuropathy, continue gabapentin  300 mg 3 times a day.  Managed by PCP.  # Annual dilated diabetic eye exams.   - Diet: Make healthy diabetic food choices - Life style / activity / exercise: Discussed.  2. Blood pressure  -  BP Readings from Last 1 Encounters:  06/07/23 128/60    - Control is in target.  - No change in current plans.  3. Lipid status / Hyperlipidemia - Last  Lab Results  Component Value Date   LDLCALC 58 03/08/2023   - Continue pitavastatin  1 mg daily.  Managed by primary care provider.  Diagnoses and all orders for this visit:  Uncontrolled type 2 diabetes mellitus with hyperglycemia, with long-term current use of insulin  (HCC) -     TOUJEO  SOLOSTAR 300 UNIT/ML Solostar Pen; Inject 35 Units  into the skin in the morning and at bedtime. INJECT 25-30 UNITS UNDER THE SKIN 2 times a day. -     HUMALOG  KWIKPEN 100 UNIT/ML KwikPen; ADMINISTER 25-30 UNITS UNDER THE SKIN THREE TIMES DAILY -     Continuous Glucose Sensor (FREESTYLE LIBRE 3 PLUS SENSOR) MISC; 1 each by Does not apply route continuous. Change every 15 days.  Type 2 diabetes mellitus with diabetic neuropathy, with long-term current use of insulin  (HCC) -     pioglitazone  (ACTOS ) 30 MG tablet; TAKE 1 TABLET(30 MG) BY MOUTH DAILY -     dapagliflozin  propanediol (FARXIGA ) 10 MG TABS tablet; Take 1 tablet (10 mg total) by mouth daily.    DISPOSITION Follow up in clinic in 3 months suggested.  Labs on the same day of the visit.   All questions answered and patient verbalized understanding of the plan.  Nicholas Janiaya Ryser, MD [facility] Endocrinology [facility] [address] [place], [address] [place], [place] [address] Phone # [phone]  At least part of this note was generated using voice recognition software. Inadvertent word errors may have  occurred, which were not recognized during the proofreading process.

## 2023-06-12 ENCOUNTER — Other Ambulatory Visit: Payer: Self-pay | Admitting: Internal Medicine

## 2023-06-12 NOTE — Telephone Encounter (Unsigned)
 Copied from CRM [id number]. Topic: Clinical - Medication Refill >> Jun 12, 2023 12:22 PM Shereese L wrote: Medication: gabapentin  (NEURONTIN ) 300 MG capsule traZODone  (DESYREL ) 150 MG tablet  Has the patient contacted their pharmacy? Yes (Agent: If no, request that the patient contact the pharmacy for the refill. If patient does not wish to contact the pharmacy document the reason why and proceed with request.) (Agent: If yes, when and what did the pharmacy advise?)  This is the patient's preferred pharmacy:   [facility] [place] [address] [place] [address] Phone: [phone] Fax: [phone]  Is this the correct pharmacy for this prescription? Yes If no, delete pharmacy and type the correct one.   Has the prescription been filled recently? Yes  Is the patient out of the medication? Yes  Has the patient been seen for an appointment in the last year OR does the patient have an upcoming appointment? Yes  Can we respond through MyChart? No  Agent: Please be advised that Rx refills may take up to 3 business days. We ask that you follow-up with your pharmacy.

## 2023-07-27 ENCOUNTER — Other Ambulatory Visit: Payer: Self-pay | Admitting: Internal Medicine

## 2023-07-27 DIAGNOSIS — F409 Phobic anxiety disorder, unspecified: Secondary | ICD-10-CM

## 2023-07-31 NOTE — Telephone Encounter (Unsigned)
 Copied from CRM [id number]. Topic: Clinical - Medication Question >> Jul 31, 2023 11:09 AM Aleatha C wrote: Reason for CRM: Patient would like to know why his Suvorexant  (BELSOMRA ) 20 MG TABS was denied and if Dr Joshua plans to replace with something else

## 2023-08-02 ENCOUNTER — Other Ambulatory Visit: Payer: Self-pay | Admitting: Internal Medicine

## 2023-08-02 DIAGNOSIS — F409 Phobic anxiety disorder, unspecified: Secondary | ICD-10-CM

## 2023-08-02 MED ORDER — BELSOMRA 20 MG PO TABS: 20.0000 mg | ORAL_TABLET | Freq: Every evening | ORAL | 0 refills | Status: DC

## 2023-08-06 ENCOUNTER — Other Ambulatory Visit: Payer: Self-pay | Admitting: Internal Medicine

## 2023-08-06 DIAGNOSIS — J410 Simple chronic bronchitis: Secondary | ICD-10-CM

## 2023-08-08 ENCOUNTER — Ambulatory Visit: Admitting: Podiatry

## 2023-08-08 ENCOUNTER — Encounter: Payer: Self-pay | Admitting: Podiatry

## 2023-08-08 DIAGNOSIS — Z89419 Acquired absence of unspecified great toe: Secondary | ICD-10-CM | POA: Diagnosis not present

## 2023-08-08 DIAGNOSIS — L84 Corns and callosities: Secondary | ICD-10-CM

## 2023-08-08 DIAGNOSIS — Z794 Long term (current) use of insulin: Secondary | ICD-10-CM

## 2023-08-08 DIAGNOSIS — B351 Tinea unguium: Secondary | ICD-10-CM | POA: Diagnosis not present

## 2023-08-08 DIAGNOSIS — M79674 Pain in right toe(s): Secondary | ICD-10-CM | POA: Diagnosis not present

## 2023-08-08 DIAGNOSIS — E1140 Type 2 diabetes mellitus with diabetic neuropathy, unspecified: Secondary | ICD-10-CM

## 2023-08-08 DIAGNOSIS — M79675 Pain in left toe(s): Secondary | ICD-10-CM

## 2023-08-10 NOTE — Progress Notes (Signed)
 Subjective:  Patient ID: Nicholas Whitehead, male    DOB: July 30, 1955,  MRN: [id number]  Nicholas Whitehead presents to clinic today for at risk foot care. Patient has h/o NIDDM, neuropathy with amputation of partial amputation of left great toe and preulcerative lesion(s) of both feet and painful mycotic toenails that limit ambulation. Painful toenails interfere with ambulation. Aggravating factors include wearing enclosed shoe gear. Pain is relieved with periodic professional debridement. Painful preulcerative lesion(s) is/are aggravated when weightbearing with and without shoegear. Pain is relieved with periodic professional debridement.  Chief Complaint  Patient presents with   Diabetes    DFC IDDM A1C 7.5. LOV with PCP 02/2023.Toenail trim.    New problem(s): None.   PCP is Joshua Debby CROME, MD.  Allergies  Allergen Reactions   Benazepril  Cough   Molds & Smuts     Hard to breathe   Pravastatin  Other (See Comments)    myositis   Glipizide Nausea Only    Review of Systems: Negative except as noted in the HPI.  Objective: No changes noted in today's physical examination. There were no vitals filed for this visit. Nicholas Whitehead is a pleasant 68 y.o. male WD, WN in NAD. AAO x 3.  Vascular Examination: CFT <3 seconds b/l. DP pulses faintly palpable b/l. PT pulses palpable b/l. Digital hair absent. Skin temperature gradient warm to warm b/l. No pain with calf compression. No ischemia or gangrene. No cyanosis or clubbing noted b/l.    Neurological Examination: Pt has subjective symptoms of neuropathy. Protective sensation diminished with 10g monofilament b/l.  Dermatological Examination: Pedal skin warm and supple b/l. No open wounds. No interdigital macerations. Toenails right great toe and 2-5 b/l thick, discolored, elongated with subungual debris and pain on dorsal palpation.    Pedal skin is warm and supple b/l LE. Right great toenail impinging onto right 2nd digit. There is an  indentation, but no break in skin. No erythema, no edema, no drainage, no fluctiuance. Preulcerative lesion noted bilateral heels and distal tip of left 2nd toe. There is visible subdermal hemorrhage. There is no surrounding erythema, no edema, no drainage, no odor, no fluctuance.  Musculoskeletal Examination: Muscle strength 5/5 to all lower extremity muscle groups bilaterally. Lower extremity amputation(s): partial amputation of left great toe.SABRA No pain, crepitus or joint limitation noted with ROM b/l LE.  Patient ambulates independently without assistive aids. Hammertoe deformity noted 2-5 b/l.  Radiographs: None  Last HgA1c:      Latest Ref Rng & Units 06/05/2023    9:52 AM 03/02/2023   10:02 AM 11/29/2022    9:50 AM  Hemoglobin A1C  Hemoglobin-A1c <5.7 % 7.5  7.7  8.1    Assessment/Plan: 1. Pain due to onychomycosis of toenails of both feet   2. Pre-ulcerative corn or callous   3. History of amputation of great toe (HCC)   4. Type 2 diabetes mellitus with diabetic neuropathy, with long-term current use of insulin  [facility])     Consent given for treatment. All patient's and/or POA's questions/concerns addressed on today's visit. Toenails right great toe and 2-5 b/l debrided in length and girth without incident. Preulcerative lesion(s) bilateral heels and distal tip of left 2nd toe pared with sharp debridement without incident. Continue foot and shoe inspections daily. Monitor blood glucose per PCP/Endocrinologist's recommendations.Continue soft, supportive shoe gear daily. Report any pedal injuries to medical professional. Call office if there are any quesitons/concerns. -Patient/POA to call should there be question/concern in the interim.   Return in about 9  weeks (around 10/10/2023).  Nicholas Whitehead, DPM      [facility] LOCATION: [address] [place] [address]                   Office [phone]   [facility]  LOCATION: [address] [place], [place] [address] Office [phone]

## 2023-08-16 ENCOUNTER — Ambulatory Visit: Admitting: Internal Medicine

## 2023-08-17 ENCOUNTER — Other Ambulatory Visit: Payer: Self-pay | Admitting: Internal Medicine

## 2023-08-17 DIAGNOSIS — I10 Essential (primary) hypertension: Secondary | ICD-10-CM

## 2023-08-17 DIAGNOSIS — E1140 Type 2 diabetes mellitus with diabetic neuropathy, unspecified: Secondary | ICD-10-CM

## 2023-08-20 ENCOUNTER — Other Ambulatory Visit: Payer: Self-pay | Admitting: Internal Medicine

## 2023-08-20 ENCOUNTER — Encounter: Payer: Self-pay | Admitting: Internal Medicine

## 2023-08-20 ENCOUNTER — Ambulatory Visit (INDEPENDENT_AMBULATORY_CARE_PROVIDER_SITE_OTHER): Admitting: Internal Medicine

## 2023-08-20 VITALS — BP 120/68 | HR 72 | Temp 97.9°F | Ht 72.0 in | Wt 185.0 lb

## 2023-08-20 DIAGNOSIS — B20 Human immunodeficiency virus [HIV] disease: Secondary | ICD-10-CM | POA: Diagnosis not present

## 2023-08-20 DIAGNOSIS — F409 Phobic anxiety disorder, unspecified: Secondary | ICD-10-CM

## 2023-08-20 DIAGNOSIS — Z794 Long term (current) use of insulin: Secondary | ICD-10-CM | POA: Diagnosis not present

## 2023-08-20 DIAGNOSIS — E0840 Diabetes mellitus due to underlying condition with diabetic neuropathy, unspecified: Secondary | ICD-10-CM | POA: Diagnosis not present

## 2023-08-20 DIAGNOSIS — E119 Type 2 diabetes mellitus without complications: Secondary | ICD-10-CM | POA: Diagnosis not present

## 2023-08-20 DIAGNOSIS — N1831 Chronic kidney disease, stage 3a: Secondary | ICD-10-CM

## 2023-08-20 DIAGNOSIS — F32A Depression, unspecified: Secondary | ICD-10-CM

## 2023-08-20 DIAGNOSIS — Z23 Encounter for immunization: Secondary | ICD-10-CM

## 2023-08-20 DIAGNOSIS — F419 Anxiety disorder, unspecified: Secondary | ICD-10-CM

## 2023-08-20 DIAGNOSIS — I5189 Other ill-defined heart diseases: Secondary | ICD-10-CM | POA: Diagnosis not present

## 2023-08-20 LAB — URINALYSIS, ROUTINE W REFLEX MICROSCOPIC
Bilirubin Urine: NEGATIVE
Ketones, ur: NEGATIVE
Leukocytes,Ua: NEGATIVE
Nitrite: NEGATIVE
Specific Gravity, Urine: 1.01 (ref 1.000–1.030)
Total Protein, Urine: NEGATIVE
Urine Glucose: >=1000 — AB
Urobilinogen, UA: 0.2 (ref 0.0–1.0)
WBC, UA: NONE SEEN (ref 0–?)
pH: 6 (ref 5.0–8.0)

## 2023-08-20 LAB — BASIC METABOLIC PANEL WITH GFR
BUN: 19 mg/dL (ref 6–23)
CO2: 32 meq/L (ref 19–32)
Calcium: 9.7 mg/dL (ref 8.4–10.5)
Chloride: 102 meq/L (ref 96–112)
Creatinine, Ser: 1.33 mg/dL (ref 0.40–1.50)
GFR: 55.98 mL/min — ABNORMAL LOW (ref >60.00)
Glucose, Bld: 124 mg/dL — ABNORMAL HIGH (ref 70–99)
Potassium: 4.3 meq/L (ref 3.5–5.1)
Sodium: 141 meq/L (ref 135–145)

## 2023-08-20 LAB — CBC WITH DIFFERENTIAL/PLATELET
Basophils Absolute: 0 K/uL (ref 0.0–0.1)
Basophils Relative: 0.2 % (ref 0.0–3.0)
Eosinophils Absolute: 0 K/uL (ref 0.0–0.7)
Eosinophils Relative: 0.6 % (ref 0.0–5.0)
HCT: 44.5 % (ref 39.0–52.0)
Hemoglobin: 15 g/dL (ref 13.0–17.0)
Lymphocytes Relative: 25.9 % (ref 12.0–46.0)
Lymphs Abs: 1.8 K/uL (ref 0.7–4.0)
MCHC: 33.6 g/dL (ref 30.0–36.0)
MCV: 99.4 fl (ref 78.0–100.0)
Monocytes Absolute: 0.7 K/uL (ref 0.1–1.0)
Monocytes Relative: 9.9 % (ref 3.0–12.0)
Neutro Abs: 4.3 K/uL (ref 1.4–7.7)
Neutrophils Relative %: 63.4 % (ref 43.0–77.0)
Platelets: 171 K/uL (ref 150.0–400.0)
RBC: 4.48 Mil/uL (ref 4.22–5.81)
RDW: 14 % (ref 11.5–15.5)
WBC: 6.8 K/uL (ref 4.0–10.5)

## 2023-08-20 LAB — MICROALBUMIN / CREATININE URINE RATIO
Creatinine,U: 20.7 mg/dL
Microalb Creat Ratio: UNDETERMINED mg/g (ref 0.0–30.0)
Microalb, Ur: <0.7 mg/dL

## 2023-08-20 LAB — HEMOGLOBIN A1C: Hgb A1c MFr Bld: 7.6 % — ABNORMAL HIGH (ref 4.6–6.5)

## 2023-08-20 MED ORDER — BOOSTRIX 5-2.5-18.5 LF-MCG/0.5 IM SUSP: 0.5000 mL | Freq: Once | INTRAMUSCULAR | 0 refills | Status: AC

## 2023-08-20 NOTE — Patient Instructions (Signed)

## 2023-08-20 NOTE — Progress Notes (Signed)
 Subjective:  Patient ID: Nicholas Whitehead, male    DOB: [birth year]  Age: 68 y.o. MRN: [id number]  CC: Hypertension and Diabetes   HPI Nicholas Whitehead presents for f/up ----  Discussed the use of AI scribe software for clinical note transcription with the patient, who gave verbal consent to proceed.  History of Present Illness Nicholas Whitehead is a 68 year old male who presents for a routine follow-up visit.  He experiences regular chest pain and shortness of breath, though he is not severe enough to disturb him. He has a persistent cough with green phlegm, which is a chronic issue for him. No recent fevers, chills, or night sweats.  His foot pain has improved with the use of Skechers shoes with absorbent heels, alleviating pain from calluses. He experiences occasional mild pain, but it is not as severe as before.  He has reduced his smoking to three or four cigarettes a day and is working towards quitting completely. He does not consume alcohol  regularly, with his last drink being four months ago. He is embracing his religion more, which he finds beneficial.  His blood sugar levels have been variable, but he is not experiencing any significant problems.    Outpatient Medications Prior to Visit  Medication Sig Dispense Refill   ACCU-CHEK GUIDE TEST test strip TABLET BLOOD SUGAR FOUR TIMES DAILY 400 strip 1   albuterol  (VENTOLIN  HFA) 108 (90 Base) MCG/ACT inhaler INHALE TWO PUFFS INTO LUNGS EVERY 6 HOURS AS NEEDED FOR WHEEZING OR FOR SHORTNESS OF BREATH 18 g 3   Alcohol  Swabs  (B-D SINGLE USE SWABS  REGULAR) PADS USE AS DIRECTED 300 each 1   aspirin  EC 81 MG tablet Take 1 tablet (81 mg total) by mouth daily. 90 tablet 1   Blood Glucose Monitoring Suppl (ACCU-CHEK GUIDE) w/Device KIT Use to check blood sugars four times a day 1 kit 0   cetirizine (ZYRTEC) 10 MG tablet Take 10 mg by mouth daily.     Cholecalciferol  2000 units TABS Take 1 tablet (2,000 Units total) by mouth daily. 90 tablet 1    Continuous Glucose Sensor (FREESTYLE LIBRE 3 PLUS SENSOR) MISC 1 each by Does not apply route continuous. Change every 15 days. 6 each 4   Glycopyrrolate -Formoterol  (BEVESPI  AEROSPHERE) 9-4.8 MCG/ACT AERO Inhale 2 puffs into the lungs 2 (two) times daily. 32.1 g 1   HUMALOG  KWIKPEN 100 UNIT/ML KwikPen ADMINISTER 25-30 UNITS UNDER THE SKIN THREE TIMES DAILY 45 mL 4   Insulin  Pen Needle (B-D ULTRAFINE III SHORT PEN) 31G X 8 MM MISC USE TO INJECT INSULIN  AS DIRECTED THREE TIMES DAILY PER HUMALOG  500 each 1   Lancets MISC Inject 1 Act into the skin 4 (four) times daily. Use to test blood sugar tid. DX: E11.8 300 each 3   mirtazapine  (REMERON ) 7.5 MG tablet TAKE ONE TABLET BY MOUTH DAILY AT 9PM 90 tablet 11   ODEFSEY  200-25-25 MG TABS tablet TAKE ONE TABLET BY MOUTH DAILY 90 tablet 1   pioglitazone  (ACTOS ) 30 MG tablet TAKE 1 TABLET(30 MG) BY MOUTH DAILY 90 tablet 3   Pitavastatin  Calcium  (LIVALO ) 1 MG TABS Take 1 tablet (1 mg total) by mouth daily. TAKE 1 TABLET(1 MG) BY MOUTH DAILY 90 tablet 1   Semaglutide ,0.25 or 0.5MG /DOS, (OZEMPIC , 0.25 OR 0.5 MG/DOSE,) 2 MG/3ML SOPN Inject 0.5 mg into the skin once a week. 9 mL 3   Suvorexant  (BELSOMRA ) 20 MG TABS Take 1 tablet (20 mg total) by mouth at bedtime. 90 tablet 0  torsemide  (DEMADEX ) 20 MG tablet TAKE ONE TABLET BY MOUTH ON MONDAY, WEDNESDAY AND FRIDAY 36 tablet 2   TOUJEO  SOLOSTAR 300 UNIT/ML Solostar Pen Inject 35 Units into the skin in the morning and at bedtime. INJECT 25-30 UNITS UNDER THE SKIN 2 times a day. 12 mL 4   dapagliflozin  propanediol (FARXIGA ) 10 MG TABS tablet Take 1 tablet (10 mg total) by mouth daily. 90 tablet 3   DULoxetine  (CYMBALTA ) 60 MG capsule TAKE ONE CAPSULE (60 MG) BY MOUTH DAILY AT 9AM 90 capsule 0   gabapentin  (NEURONTIN ) 300 MG capsule Take 1 capsule (300 mg total) by mouth 3 (three) times daily. 270 capsule 0   Glucagon  (GVOKE HYPOPEN  2-PACK) 1 MG/0.2ML SOAJ Inject 1 Act into the skin daily as needed. 2 mL 6   Glucagon ,  rDNA, (GLUCAGON  EMERGENCY) 1 MG KIT Inject 1 mg into the skin once for 1 dose. 1 kit 5   mupirocin  ointment (BACTROBAN ) 2 % Apply 1 Application topically 2 (two) times daily. 30 g 2   Nebivolol  HCl 20 MG TABS TAKE 1 TABLET(20 MG) BY MOUTH DAILY 90 tablet 1   olmesartan  (BENICAR ) 40 MG tablet TAKE ONE TABLET (40 MG) BY MOUTH DAILY 90 tablet 0   traZODone  (DESYREL ) 150 MG tablet Take 3 tablets (450 mg total) by mouth at bedtime. 270 tablet 0   No facility-administered medications prior to visit.    ROS Review of Systems  Constitutional:  Negative for appetite change, chills, diaphoresis, fatigue and fever.  HENT: Negative.  Negative for sore throat.   Eyes: Negative.   Respiratory:  Positive for chest tightness and shortness of breath. Negative for cough and wheezing.   Cardiovascular:  Negative for chest pain, palpitations and leg swelling.  Gastrointestinal:  Negative for abdominal pain, constipation, diarrhea, nausea and vomiting.  Endocrine: Negative.   Genitourinary: Negative.  Negative for difficulty urinating, dysuria, flank pain, frequency and hematuria.  Musculoskeletal:  Positive for arthralgias. Negative for myalgias.  Skin: Negative.   Neurological:  Negative for dizziness and weakness.  Hematological:  Negative for adenopathy. Does not bruise/bleed easily.  Psychiatric/Behavioral:  Positive for sleep disturbance. Negative for confusion and decreased concentration. The patient is not nervous/anxious.     Objective:  BP 120/68 (BP Location: Right Arm, Patient Position: Sitting)   Pulse 72   Temp 97.9 F (36.6 C) (Temporal)   Ht 6' (1.829 m)   Wt 185 lb (83.9 kg)   SpO2 97%   BMI 25.09 kg/m   BP Readings from Last 3 Encounters:  08/20/23 120/68  06/07/23 128/60  04/25/23 113/62    Wt Readings from Last 3 Encounters:  08/20/23 185 lb (83.9 kg)  06/07/23 185 lb 3.2 oz (84 kg)  03/08/23 190 lb (86.2 kg)    Physical Exam Vitals reviewed.  Constitutional:       Appearance: Normal appearance.  HENT:     Nose: Nose normal.     Mouth/Throat:     Mouth: Mucous membranes are moist.  Eyes:     General: No scleral icterus.    Conjunctiva/sclera: Conjunctivae normal.  Cardiovascular:     Rate and Rhythm: Normal rate and regular rhythm.     Heart sounds: No murmur heard.    No friction rub. No gallop.  Pulmonary:     Effort: Pulmonary effort is normal.     Breath sounds: No stridor. No wheezing, rhonchi or rales.  Abdominal:     General: Abdomen is flat.     Palpations:  There is no mass.     Tenderness: There is no abdominal tenderness. There is no guarding.     Hernia: No hernia is present.  Musculoskeletal:        General: Normal range of motion.     Cervical back: Neck supple.     Right lower leg: No edema.     Left lower leg: No edema.  Lymphadenopathy:     Cervical: No cervical adenopathy.  Skin:    General: Skin is warm and dry.  Neurological:     Mental Status: He is alert. Mental status is at baseline.  Psychiatric:        Mood and Affect: Mood normal.        Behavior: Behavior normal.     Lab Results  Component Value Date   WBC 6.8 08/20/2023   HGB 15.0 08/20/2023   HCT 44.5 08/20/2023   PLT 171.0 08/20/2023   GLUCOSE 124 (H) 08/20/2023   CHOL 137 03/08/2023   TRIG 143.0 03/08/2023   HDL 50.50 03/08/2023   LDLCALC 58 03/08/2023   ALT 12 03/08/2023   AST 15 03/08/2023   NA 141 08/20/2023   K 4.3 08/20/2023   CL 102 08/20/2023   CREATININE 1.33 08/20/2023   BUN 19 08/20/2023   CO2 32 08/20/2023   TSH 0.80 03/08/2023   PSA 1.56 03/08/2023   HGBA1C 7.6 (H) 08/20/2023   MICROALBUR <0.7 08/20/2023    CT CORONARY FFR DATA PREP & FLUID ANALYSIS Result Date: 04/26/2023 EXAM: CT FFR ANALYSIS CLINICAL DATA:  Chest pain/anginal equiv, high CAD risk, not treadmill candidate FINDINGS: FFRct analysis was performed on the original cardiac CT angiogram dataset. Diagrammatic representation of the FFRct analysis is provided in a  separate PDF document in PACS. This dictation was created using the PDF document and an interactive 3D model of the results. 3D model is not available in the EMR/PACS. Normal FFR range is >0.80. Indeterminate (grey) zone is 0.76-0.80. FFR delta of 0.13 is considered significant. 1. Left Main: FFR = 0.96 2. LAD: Proximal FFR = 0.92, mid FFR = 0.84, distal FFR = 0.76 3. Ramus: Proximal FFR = 0.94, Distal FFR = 0.89 4. LCX: Proximal FFR = 0.95, distal FFR = 0.84 5. RCA: Proximal FFR = 0.98, Mid FFR =0.92, Distal FFR = 0.83 IMPRESSION: 1.  CT FFR analysis showed no significant stenosis. RECOMMENDATIONS: Guideline-directed medical therapy and aggressive risk factor modification for secondary prevention of coronary artery disease. Electronically Signed   By: Madonna Large   On: 04/26/2023 16:47    Assessment & Plan:  Diabetes mellitus due to underlying condition with diabetic neuropathy, with long-term current use of insulin  (HCC) -     Hepatitis B surface antibody,quantitative; Future  Insulin -requiring or dependent type II diabetes mellitus (HCC)- His blood sugar is well controlled. -     Basic metabolic panel with GFR; Future -     Hemoglobin A1c; Future -     Urinalysis, Routine w reflex microscopic; Future -     Microalbumin / creatinine urine ratio; Future -     CBC with Differential/Platelet; Future -     Gvoke HypoPen  2-Pack; Inject 1 Act into the skin daily as needed.  Dispense: 2 mL; Refill: 6  Need for prophylactic vaccination with combined diphtheria-tetanus-pertussis (DTP) vaccine -     Boostrix ; Inject 0.5 mLs into the muscle once for 1 dose.  Dispense: 0.5 mL; Refill: 0  HIV disease (HCC) -     T-helper cell (  CD4)- ([facility] clinic only) -     HIV-1 RNA quant-no reflex-bld  Grade II diastolic dysfunction- No signs of fluid excess.  Stage 3a chronic kidney disease (HCC)- Will avoid nephrotoxic agents     Follow-up: Return in about 6 months (around 02/20/2024).  Debby Molt, MD

## 2023-08-20 NOTE — Telephone Encounter (Signed)
 Copied from CRM [id number]. Topic: Clinical - Medication Refill >> Aug 20, 2023  5:22 PM [place] J wrote: Medication:  traZODone  (DESYREL ) 150 MG tablet DULoxetine  (CYMBALTA ) 60 MG capsule  Has the patient contacted their pharmacy? Yes (Agent: If no, request that the patient contact the pharmacy for the refill. If patient does not wish to contact the pharmacy document the reason why and proceed with request.) (Agent: If yes, when and what did the pharmacy advise?) Pharmacy is calling to complete refill request.  This is the patient's preferred pharmacy:  [facility] [place] - [address] [place] [address] Phone: [phone] Fax: [phone]  Is this the correct pharmacy for this prescription? Yes If no, delete pharmacy and type the correct one.   Has the prescription been filled recently? No  Is the patient out of the medication? Yes  Has the patient been seen for an appointment in the last year OR does the patient have an upcoming appointment? Yes  Can we respond through MyChart? No  Agent: Please be advised that Rx refills may take up to 3 business days. We ask that you follow-up with your pharmacy.

## 2023-08-21 MED ORDER — GVOKE HYPOPEN 2-PACK 1 MG/0.2ML ~~LOC~~ SOAJ: 1.0000 | Freq: Every day | SUBCUTANEOUS | 6 refills | Status: AC | PRN

## 2023-08-23 LAB — HIV-1 RNA QUANT-NO REFLEX-BLD
HIV 1 RNA Quant: NOT DETECTED {copies}/mL
HIV-1 RNA Quant, Log: NOT DETECTED {Log_copies}/mL

## 2023-08-23 LAB — HEPATITIS B SURFACE ANTIBODY, QUANTITATIVE: Hep B S AB Quant (Post): 566 m[IU]/mL (ref >=10)

## 2023-08-24 ENCOUNTER — Ambulatory Visit: Payer: Self-pay | Admitting: Internal Medicine

## 2023-08-25 ENCOUNTER — Other Ambulatory Visit: Payer: Self-pay | Admitting: Internal Medicine

## 2023-08-25 DIAGNOSIS — E1165 Type 2 diabetes mellitus with hyperglycemia: Secondary | ICD-10-CM

## 2023-08-28 ENCOUNTER — Ambulatory Visit: Payer: 59 | Admitting: Internal Medicine

## 2023-08-28 ENCOUNTER — Other Ambulatory Visit: Payer: Self-pay | Admitting: Internal Medicine

## 2023-08-28 DIAGNOSIS — E1165 Type 2 diabetes mellitus with hyperglycemia: Secondary | ICD-10-CM

## 2023-08-28 NOTE — Telephone Encounter (Unsigned)
 Copied from CRM #[id number]. Topic: Clinical - Medication Refill >> Aug 28, 2023 12:30 PM Suzen RAMAN wrote: Medication: HUMALOG  KWIKPEN 100 UNIT/ML KwikPen  Has the patient contacted their pharmacy? Yes  This is the patient's preferred pharmacy:  [facility] [place] [address] [place] [address] Phone: [phone] Fax: [phone]  Is this the correct pharmacy for this prescription? Yes If no, delete pharmacy and type the correct one.   Has the prescription been filled recently? No  Is the patient out of the medication? No  Has the patient been seen for an appointment in the last year OR does the patient have an upcoming appointment? Yes  Can we respond through MyChart? Yes  Agent: Please be advised that Rx refills may take up to 3 business days. We ask that you follow-up with your pharmacy.

## 2023-08-29 MED ORDER — HUMALOG KWIKPEN 100 UNIT/ML ~~LOC~~ SOPN: PEN_INJECTOR | SUBCUTANEOUS | 4 refills | Status: DC

## 2023-08-30 ENCOUNTER — Other Ambulatory Visit: Payer: Self-pay | Admitting: Internal Medicine

## 2023-08-30 DIAGNOSIS — F32A Depression, unspecified: Secondary | ICD-10-CM

## 2023-08-30 DIAGNOSIS — F409 Phobic anxiety disorder, unspecified: Secondary | ICD-10-CM

## 2023-08-30 NOTE — Telephone Encounter (Signed)
 Copied from CRM [id number]. Topic: Clinical - Medication Refill >> Aug 30, 2023  4:14 PM Drema MATSU wrote: Medication: traZODone  (DESYREL ) 150 MG tablet, DULoxetine  (CYMBALTA ) 60 MG capsule,   Has the patient contacted their pharmacy? Yes (Agent: If no, request that the patient contact the pharmacy for the refill. If patient does not wish to contact the pharmacy document the reason why and proceed with request.) (Agent: If yes, when and what did the pharmacy advise?)  This is the patient's preferred pharmacy:   [facility] [place] [address] [place] [address] Phone: [phone] Fax: [phone]  Is this the correct pharmacy for this prescription? Yes If no, delete pharmacy and type the correct one.   Has the prescription been filled recently? No  Is the patient out of the medication? No  Has the patient been seen for an appointment in the last year OR does the patient have an upcoming appointment? Yes  Can we respond through MyChart? No  Agent: Please be advised that Rx refills may take up to 3 business days. We ask that you follow-up with your pharmacy.

## 2023-09-03 ENCOUNTER — Other Ambulatory Visit: Payer: Self-pay | Admitting: "Endocrinology

## 2023-09-04 ENCOUNTER — Other Ambulatory Visit: Payer: Self-pay | Admitting: Internal Medicine

## 2023-09-04 DIAGNOSIS — F409 Phobic anxiety disorder, unspecified: Secondary | ICD-10-CM

## 2023-09-04 DIAGNOSIS — E1140 Type 2 diabetes mellitus with diabetic neuropathy, unspecified: Secondary | ICD-10-CM

## 2023-09-04 DIAGNOSIS — F32A Depression, unspecified: Secondary | ICD-10-CM

## 2023-09-04 NOTE — Telephone Encounter (Signed)
 Requested Prescriptions   Pending Prescriptions Disp Refills   OZEMPIC , 0.25 OR 0.5 MG/DOSE, 2 MG/3ML SOPN [Pharmacy Med Name: Ozempic  0.25 mg or 0.5 mg (2 mg/3 mL) subcutaneous pen injector] 9 mL 3    Sig: INJECT 0.5 MG SUBCUTANEOUSLY ONCE A WEEK

## 2023-09-04 NOTE — Telephone Encounter (Unsigned)
 Copied from CRM [id number]. Topic: Clinical - Medication Refill >> Sep 04, 2023  3:47 PM [name] wrote: Medication: torsemide  (DEMADEX ) 20 MG tablet DULoxetine  (CYMBALTA ) 60 MG capsule traZODone  (DESYREL ) 150 MG tablet   Has the patient contacted their pharmacy? Yes (Agent: If no, request that the patient contact the pharmacy for the refill. If patient does not wish to contact the pharmacy document the reason why and proceed with request.) (Agent: If yes, when and what did the pharmacy advise?)  This is the patient's preferred pharmacy:    [facility] [place] [address] [place] [address] Phone: [phone] Fax: [phone]  Is this the correct pharmacy for this prescription? Yes If no, delete pharmacy and type the correct one.  *****Pharmacy called in script   Has the prescription been filled recently? No  Is the patient out of the medication? Yes  Has the patient been seen for an appointment in the last year OR does the patient have an upcoming appointment? Yes  Can we respond through MyChart? No  Agent: Please be advised that Rx refills may take up to 3 business days. We ask that you follow-up with your pharmacy.   Der[facility] called in refill. [phone] Requested 90 day supply

## 2023-09-04 NOTE — Telephone Encounter (Unsigned)
 Copied from CRM #[id number]. Topic: Clinical - Medication Refill >> Sep 04, 2023  1:50 PM Suzen RAMAN wrote: Medication: gabapentin  (NEURONTIN ) 300 MG capsule traZODone  (DESYREL ) 150 MG tablet   Has the patient contacted their pharmacy? Yes (  This is the patient's preferred pharmacy:   [facility] [place] [address] [place] [address] Phone: [phone] Fax: [phone]  Is this the correct pharmacy for this prescription? Yes If no, delete pharmacy and type the correct one.   Has the prescription been filled recently? No  Is the patient out of the medication? No  Has the patient been seen for an appointment in the last year OR does the patient have an upcoming appointment? Yes  Can we respond through MyChart? No  Agent: Please be advised that Rx refills may take up to 3 business days. We ask that you follow-up with your pharmacy.

## 2023-09-04 NOTE — Telephone Encounter (Signed)
 Copied from CRM [id number]. Topic: Clinical - Medication Refill >> Sep 04, 2023  3:50 PM Dedra B wrote: Medication: Jardiance 10 mg  Tamsulosin 0.4 mg Neither are on med list  Has the patient contacted their pharmacy? No [name] from [facility] called [phone]   This is the patient's preferred pharmacy:   [facility] [place], [place] - [address] [place] [address] Phone: [phone] Fax: [phone]  Is this the correct pharmacy for this prescription? Yes  Has the prescription been filled recently? No  Is the patient out of the medication? No  Has the patient been seen for an appointment in the last year OR does the patient have an upcoming appointment? Yes  Can we respond through MyChart? No  Agent: Please be advised that Rx refills may take up to 3 business days. We ask that you follow-up with your pharmacy.

## 2023-09-13 ENCOUNTER — Other Ambulatory Visit: Payer: Self-pay | Admitting: Internal Medicine

## 2023-09-13 DIAGNOSIS — B20 Human immunodeficiency virus [HIV] disease: Secondary | ICD-10-CM

## 2023-09-13 NOTE — Telephone Encounter (Signed)
 Dispensed on 9/4, additional refills at 9/9 appt

## 2023-09-17 ENCOUNTER — Encounter: Payer: Self-pay | Admitting: Endocrinology

## 2023-09-17 ENCOUNTER — Ambulatory Visit (INDEPENDENT_AMBULATORY_CARE_PROVIDER_SITE_OTHER): Admitting: Endocrinology

## 2023-09-17 VITALS — BP 128/70 | HR 60 | Resp 20 | Ht 72.0 in | Wt 186.4 lb

## 2023-09-17 DIAGNOSIS — Z794 Long term (current) use of insulin: Secondary | ICD-10-CM | POA: Diagnosis not present

## 2023-09-17 DIAGNOSIS — E1165 Type 2 diabetes mellitus with hyperglycemia: Secondary | ICD-10-CM | POA: Diagnosis not present

## 2023-09-17 MED ORDER — DAPAGLIFLOZIN PROPANEDIOL 10 MG PO TABS: 10.0000 mg | ORAL_TABLET | Freq: Every day | ORAL | 3 refills | Status: DC

## 2023-09-17 NOTE — Progress Notes (Signed)
 Outpatient Endocrinology Note [name], MD  09/17/23  Patient's Name: Nicholas Whitehead    DOB: March 24, 1955    MRN: [id number]                                                    REASON OF VISIT: Follow up for type 2 diabetes mellitus  PCP: Joshua Debby CROME, MD  HISTORY OF PRESENT ILLNESS:   Nicholas Whitehead is a 68 y.o. old male with past medical history listed below, is here for follow up for type 2 diabetes mellitus.   Pertinent Diabetes History: Patient was diagnosed with type 2 diabetes mellitus in 1998.  Insulin  therapy was started around 2006. He requires large doses of insulin  especially basal insulin .  Chronic Diabetes Complications : Retinopathy: no. Last ophthalmology exam was done on 08/2022, annually, following with ophthalmology regularly.  Nephropathy: no, on ACE/ARB /olmesartan . Peripheral neuropathy: yes, on Has had paresthesiae in feet and legs, these are treated with gabapentin  300 mg 3 times a day.  He follows up with podiatry as well. Coronary artery disease: no Stroke: no  Relevant comorbidities and cardiovascular risk factors: Obesity: no Body mass index is 25.28 kg/m.  Hypertension: Yes  Hyperlipidemia : Yes, on statin   Current / Home Diabetic regimen includes:   Insulin  regimen: Toujeo : 35 units bid; Humalog  25-30 units before meals two - three times a day.        Oral hypoglycemic drugs: Actos  30 mg, Ozempic  0.5 weekly, Farxiga  10mg  daily.    Prior diabetic medications: Invokana  in the past, increase urgency and frequency of urination.  Had nausea with Ozempic  1 mg.  Glycemic data:    CONTINUOUS GLUCOSE MONITORING SYSTEM (CGMS) INTERPRETATION:         FreeStyle Libre 3 CGM-  Sensor Download (Sensor download was reviewed and summarized below.) Dates: August 12 - September 21, 2023, 14 days Sensor Average: 170  Glucose Management Indicator: 7.4%  % data captured: 66%    Impression: Occasional hyperglycemia postprandially with blood sugar up  to 250 range related to high carb meal.  Blood sugar overnight and in between the meals are acceptable.  No hypoglycemia.  Hypoglycemia: Patient has no hypoglycemic episodes. Patient has hypoglycemia awareness.  Factors modifying glucose control: 1.  Diabetic diet assessment: 3 meals a day.  Usually avoiding high-fat meals.  2.  Staying active or exercising: No formal exercise.  3.  Medication compliance: compliant most of the time.  Interval history  Hemoglobin A1c 7.6%.  CGM data as reviewed above with occasional hyperglycemia postprandially.  She reports he had few celebration in last few weeks.  He is also not exercising regularly lately.  No other complaints today.   REVIEW OF SYSTEMS As per history of present illness.   PAST MEDICAL HISTORY: Past Medical History:  Diagnosis Date   Anxiety    Cataract    right    Depression    Diabetes mellitus without complication (HCC)    DM (diabetes mellitus screen)    HIV disease (HCC) 09/12/1995   HIV infection (HCC)    Hypercholesterolemia    Hyperlipidemia    Hypertension     PAST SURGICAL HISTORY: Past Surgical History:  Procedure Laterality Date   COLONOSCOPY WITH PROPOFOL   01/16/2012   Procedure: COLONOSCOPY WITH PROPOFOL ;  Surgeon: Gladis MARLA Louder, MD;  Location: [facility];  Service: Endoscopy;  Laterality: N/A;   EYE SURGERY  01/10/2003   catarct surgery   foot sugery Bilateral 12/29/2022   optic lens surgery  01/10/2003    ALLERGIES: Allergies  Allergen Reactions   Benazepril  Cough   Molds & Smuts     Hard to breathe   Pravastatin  Other (See Comments)    myositis   Glipizide Nausea Only    FAMILY HISTORY:  Family History  Problem Relation Age of Onset   Arthritis Mother    Diabetes Mother    Heart disease Mother    Stroke Mother    Arthritis Father    Diabetes Father    Heart disease Father     SOCIAL HISTORY: Social History   Socioeconomic History   Marital status: Single    Spouse name:  Not on file   Number of children: Not on file   Years of education: Not on file   Highest education level: Not on file  Occupational History   Occupation: disability  Tobacco Use   Smoking status: Every Day    Current packs/day: 0.25    Types: Cigarettes   Smokeless tobacco: Never   Tobacco comments:    Occ smoking/ trying to quit  Vaping Use   Vaping status: Never Used  Substance and Sexual Activity   Alcohol  use: No    Alcohol /week: 0.0 standard drinks of alcohol     Comment: socially   Drug use: No   Sexual activity: Never    Birth control/protection: Other-see comments    Comment: declined condoms  Other Topics Concern   Not on file  Social History Narrative   Lives alone   Social Drivers of Health   Financial Resource Strain: Medium Risk (02/05/2023)   Overall Financial Resource Strain (CARDIA)    Difficulty of Paying Living Expenses: Somewhat hard  Food Insecurity: Food Insecurity Present (02/05/2023)   Hunger Vital Sign    Worried About Running Out of Food in the Last Year: Sometimes true    Ran Out of Food in the Last Year: Sometimes true  Transportation Needs: No Transportation Needs (02/05/2023)   PRAPARE - Administrator, Civil Service (Medical): No    Lack of Transportation (Non-Medical): No  Physical Activity: Inactive (02/05/2023)   Exercise Vital Sign    Days of Exercise per Week: 0 days    Minutes of Exercise per Session: 0 min  Stress: No Stress Concern Present (02/05/2023)   [employer] of Occupational Health - Occupational Stress Questionnaire    Feeling of Stress : Only a little  Social Connections: Unknown (02/05/2023)   Social Connection and Isolation Panel    Frequency of Communication with Friends and Family: More than three times a week    Frequency of Social Gatherings with Friends and Family: Never    Attends Religious Services: Not on Marketing executive or Organizations: Not on file    Attends Banker  Meetings: Not on file    Marital Status: Never married    MEDICATIONS:  Current Outpatient Medications  Medication Sig Dispense Refill   ACCU-CHEK GUIDE TEST test strip TABLET BLOOD SUGAR FOUR TIMES DAILY 400 strip 1   albuterol  (VENTOLIN  HFA) 108 (90 Base) MCG/ACT inhaler INHALE TWO PUFFS INTO LUNGS EVERY 6 HOURS AS NEEDED FOR WHEEZING OR FOR SHORTNESS OF BREATH 18 g 3   Alcohol  Swabs  (B-D SINGLE USE SWABS  REGULAR) PADS USE AS DIRECTED 300 each 1  aspirin  EC 81 MG tablet Take 1 tablet (81 mg total) by mouth daily. 90 tablet 1   Blood Glucose Monitoring Suppl (ACCU-CHEK GUIDE) w/Device KIT Use to check blood sugars four times a day 1 kit 0   cetirizine (ZYRTEC) 10 MG tablet Take 10 mg by mouth daily.     Cholecalciferol  2000 units TABS Take 1 tablet (2,000 Units total) by mouth daily. 90 tablet 1   Continuous Glucose Sensor (FREESTYLE LIBRE 3 PLUS SENSOR) MISC 1 each by Does not apply route continuous. Change every 15 days. 6 each 4   dapagliflozin  propanediol (FARXIGA ) 10 MG TABS tablet Take 1 tablet (10 mg total) by mouth daily before breakfast. 90 tablet 3   DULoxetine  (CYMBALTA ) 60 MG capsule TAKE ONE CAPSULE BY MOUTH (60MG ) DAILY AT 9AM 90 capsule 0   gabapentin  (NEURONTIN ) 300 MG capsule TAKE ONE CAPSULE BY MOUTH (300MG  TOTAL) THREE TIMES DAILY AT 9AM, 3PM AND 9PM 270 capsule 0   Glucagon  (GVOKE HYPOPEN  2-PACK) 1 MG/0.2ML SOAJ Inject 1 Act into the skin daily as needed. 2 mL 6   Glycopyrrolate -Formoterol  (BEVESPI  AEROSPHERE) 9-4.8 MCG/ACT AERO Inhale 2 puffs into the lungs 2 (two) times daily. 32.1 g 1   HUMALOG  KWIKPEN 100 UNIT/ML KwikPen ADMINISTER 25-30 UNITS UNDER THE SKIN THREE TIMES DAILY 45 mL 4   Insulin  Pen Needle (B-D ULTRAFINE III SHORT PEN) 31G X 8 MM MISC USE TO INJECT INSULIN  AS DIRECTED THREE TIMES DAILY PER HUMALOG  500 each 1   Lancets MISC Inject 1 Act into the skin 4 (four) times daily. Use to test blood sugar tid. DX: E11.8 300 each 3   mirtazapine  (REMERON ) 7.5 MG  tablet TAKE ONE TABLET BY MOUTH DAILY AT 9PM 90 tablet 11   ODEFSEY  200-25-25 MG TABS tablet TAKE ONE TABLET BY MOUTH DAILY 90 tablet 1   olmesartan  (BENICAR ) 40 MG tablet TAKE ONE TABLET BY MOUTH (40MG ) DAILY AT 9AM 90 tablet 0   OZEMPIC , 0.25 OR 0.5 MG/DOSE, 2 MG/3ML SOPN INJECT 0.5 MG SUBCUTANEOUSLY ONCE A WEEK 9 mL 3   pioglitazone  (ACTOS ) 30 MG tablet TAKE 1 TABLET(30 MG) BY MOUTH DAILY 90 tablet 3   Pitavastatin  Calcium  (LIVALO ) 1 MG TABS Take 1 tablet (1 mg total) by mouth daily. TAKE 1 TABLET(1 MG) BY MOUTH DAILY 90 tablet 1   Suvorexant  (BELSOMRA ) 20 MG TABS Take 1 tablet (20 mg total) by mouth at bedtime. 90 tablet 0   torsemide  (DEMADEX ) 20 MG tablet TAKE ONE TABLET BY MOUTH ON MONDAY, WEDNESDAY AND FRIDAY 36 tablet 2   TOUJEO  SOLOSTAR 300 UNIT/ML Solostar Pen Inject 35 Units into the skin in the morning and at bedtime. INJECT 25-30 UNITS UNDER THE SKIN 2 times a day. 12 mL 4   traZODone  (DESYREL ) 150 MG tablet TAKE THREE TABLETS BY MOUTH (450MG  TOTAL) DAILY AT 9PM AT BEDTIME 270 tablet 0   No current facility-administered medications for this visit.    PHYSICAL EXAM: Vitals:   09/17/23 1040  BP: 128/70  Pulse: 60  Resp: 20  SpO2: 91%  Weight: 186 lb 6.4 oz (84.6 kg)  Height: 6' (1.829 m)    Body mass index is 25.28 kg/m.  Wt Readings from Last 3 Encounters:  09/17/23 186 lb 6.4 oz (84.6 kg)  08/20/23 185 lb (83.9 kg)  06/07/23 185 lb 3.2 oz (84 kg)    General: Well developed, well nourished male in no apparent distress.  HEENT: AT/[place], no external lesions.  Eyes: Conjunctiva clear and no  icterus. Neck: Neck supple  Lungs: Respirations not labored Neurologic: Alert, oriented, normal speech Extremities / Skin: Dry.   Psychiatric: Does not appear depressed or anxious  Diabetic Foot Exam - Simple   No data filed    LABS Reviewed Lab Results  Component Value Date   HGBA1C 7.6 (H) 08/20/2023   HGBA1C 7.5 (H) 06/05/2023   HGBA1C 7.7 (H) 03/02/2023   Lab Results   Component Value Date   FRUCTOSAMINE 316 (H) 03/10/2021   FRUCTOSAMINE 367 (H) 02/01/2021   FRUCTOSAMINE 280 09/11/2019   Lab Results  Component Value Date   CHOL 137 03/08/2023   HDL 50.50 03/08/2023   LDLCALC 58 03/08/2023   TRIG 143.0 03/08/2023   CHOLHDL 3 03/08/2023   Lab Results  Component Value Date   MICRALBCREAT Unable to calculate 08/20/2023   Lab Results  Component Value Date   CREATININE 1.33 08/20/2023   Lab Results  Component Value Date   GFR 55.98 (L) 08/20/2023    ASSESSMENT / PLAN  1. Uncontrolled type 2 diabetes mellitus with hyperglycemia, with long-term current use of insulin  (HCC)     Diabetes Mellitus type 2, complicated by diabetic neuropathy. - Diabetic status / severity: Uncontrolled.  Lab Results  Component Value Date   HGBA1C 7.6 (H) 08/20/2023    - Hemoglobin A1c goal : <6.5%  Discussed about increasing dose of Ozempic , he wants to stay on the current dose.  He is concerned about potential GI side effects.  He wants to stay on the current dose of medications.  He wants to work on diet and start exercising.  - Medications: No change.  Insulin  regimen: Toujeo : 35 units bid; Humalog  25-30 units before meals 3 times a day.         Oral hypoglycemic drugs: Actos  30 mg, Ozempic  0.5 weekly, Farxiga  10mg  daily.  - Home glucose testing: CGM Freestyle libre 3+ check as needed.  - Discussed/ Gave Hypoglycemia treatment plan.  # Consult : not required at this time.   # Annual urine for microalbuminuria/ creatinine ratio, no microalbuminuria currently, continue ACE/ARB /olmesartan . Last  Lab Results  Component Value Date   MICRALBCREAT Unable to calculate 08/20/2023    # Foot check nightly / neuropathy, continue gabapentin  300 mg 3 times a day.  Managed by PCP.  # Annual dilated diabetic eye exams.   - Diet: Make healthy diabetic food choices - Life style / activity / exercise: Discussed.  2. Blood pressure  -  BP Readings from  Last 1 Encounters:  09/17/23 128/70    - Control is in target.  - No change in current plans.  3. Lipid status / Hyperlipidemia - Last  Lab Results  Component Value Date   LDLCALC 58 03/08/2023   - Continue pitavastatin  1 mg daily.  Managed by primary care provider.  Diagnoses and all orders for this visit:  Uncontrolled type 2 diabetes mellitus with hyperglycemia, with long-term current use of insulin  (HCC) -     dapagliflozin  propanediol (FARXIGA ) 10 MG TABS tablet; Take 1 tablet (10 mg total) by mouth daily before breakfast.   DISPOSITION Follow up in clinic in 3 months suggested.    All questions answered and patient verbalized understanding of the plan.  [place] Gretchen Weinfeld, MD [facility] Endocrinology [facility] [address] [place], [address] [place], [place] [address] Phone # [phone]  At least part of this note was generated using voice recognition software. Inadvertent word errors may have occurred, which were not recognized  during the proofreading process.

## 2023-09-18 ENCOUNTER — Encounter: Payer: Self-pay | Admitting: Internal Medicine

## 2023-09-18 ENCOUNTER — Other Ambulatory Visit: Payer: Self-pay

## 2023-09-18 ENCOUNTER — Ambulatory Visit: Admitting: Internal Medicine

## 2023-09-18 VITALS — BP 135/70 | HR 60 | Temp 97.6°F | Ht 72.0 in | Wt 184.0 lb

## 2023-09-18 DIAGNOSIS — B20 Human immunodeficiency virus [HIV] disease: Secondary | ICD-10-CM | POA: Diagnosis not present

## 2023-09-18 MED ORDER — BICTEGRAVIR-EMTRICITAB-TENOFOV 50-200-25 MG PO TABS: 1.0000 | ORAL_TABLET | Freq: Every day | ORAL | 11 refills | Status: AC

## 2023-09-18 NOTE — Patient Instructions (Signed)
 Stop odefsey  Start biktarvy   This has less drug drug interaction    See me in 6 months

## 2023-09-18 NOTE — Progress Notes (Signed)
   Subjective:    Patient ID: Nicholas Whitehead, male    DOB: 1955-11-03, 68 y.o.   MRN: [id number]  HPI [name] is here for follow-up of HIV. Continues on Odefsey  and denies any missed doses.  He unfortunately has had issues with grief after losing his brother in December and then another good friend last month.  He is appropriately grieving and sad about this.  He also has felt more lonely having lost people close to him.  Otherwise no new complaints.   09/18/23 id clinic visit Patient previously saw dr Efrain First visit today He is at baseline health No health concern  He has pcp and endocrinologist (for dm2)  He is odefsey  but interested in biktarvy    Review of Systems  Constitutional:  Negative for fatigue.  Gastrointestinal:  Negative for diarrhea.  Skin:  Negative for rash.       Objective:   Physical Exam Eyes:     General: No scleral icterus. Pulmonary:     Effort: Pulmonary effort is normal.  Neurological:     Mental Status: He is alert.     Soc hx: No current etoh Was a smoker - 2 to 3 cigarette a day   Labs: Lab Results  Component Value Date   WBC 6.8 08/20/2023   HGB 15.0 08/20/2023   HCT 44.5 08/20/2023   MCV 99.4 08/20/2023   PLT 171.0 08/20/2023   Last metabolic panel Lab Results  Component Value Date   GLUCOSE 124 (H) 08/20/2023   NA 141 08/20/2023   K 4.3 08/20/2023   CL 102 08/20/2023   CO2 32 08/20/2023   BUN 19 08/20/2023   CREATININE 1.33 08/20/2023   GFR 55.98 (L) 08/20/2023   CALCIUM  9.7 08/20/2023   PROT 7.5 03/08/2023   ALBUMIN 4.2 03/08/2023   BILITOT 0.3 03/08/2023   ALKPHOS 51 03/08/2023   AST 15 03/08/2023   ALT 12 03/08/2023   ANIONGAP 11 09/11/2016    Lab Results  Component Value Date   HIV1RNAQUANT NOT DETECTED 08/20/2023   Lab Results  Component Value Date   CD4TCELL 31 (L) 02/21/2023   CD4TABS 438 02/21/2023        Assessment & Plan:     #hiv Msm risk 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

## 2023-09-19 ENCOUNTER — Other Ambulatory Visit: Payer: Self-pay | Admitting: Internal Medicine

## 2023-09-19 DIAGNOSIS — E1165 Type 2 diabetes mellitus with hyperglycemia: Secondary | ICD-10-CM

## 2023-09-19 DIAGNOSIS — F409 Phobic anxiety disorder, unspecified: Secondary | ICD-10-CM

## 2023-10-01 ENCOUNTER — Other Ambulatory Visit: Payer: Self-pay | Admitting: Internal Medicine

## 2023-10-01 DIAGNOSIS — E1140 Type 2 diabetes mellitus with diabetic neuropathy, unspecified: Secondary | ICD-10-CM

## 2023-10-04 ENCOUNTER — Other Ambulatory Visit: Payer: Self-pay | Admitting: Family

## 2023-10-04 DIAGNOSIS — I10 Essential (primary) hypertension: Secondary | ICD-10-CM

## 2023-10-10 ENCOUNTER — Other Ambulatory Visit: Payer: Self-pay | Admitting: Internal Medicine

## 2023-10-10 DIAGNOSIS — B20 Human immunodeficiency virus [HIV] disease: Secondary | ICD-10-CM

## 2023-10-10 DIAGNOSIS — I10 Essential (primary) hypertension: Secondary | ICD-10-CM

## 2023-10-10 NOTE — Telephone Encounter (Signed)
 Odefsey  was discontinued, patient switched to [facility].   Arial Galligan, BSN, RN

## 2023-10-10 NOTE — Telephone Encounter (Signed)
 Copied from CRM #[id number]. Topic: Clinical - Medication Refill >> Oct 10, 2023 11:39 AM Thersia BROCKS wrote: Medication: torsemide  (DEMADEX ) 20 MG tablet   Has the patient contacted their pharmacy? Yes (Agent: If no, request that the patient contact the pharmacy for the refill. If patient does not wish to contact the pharmacy document the reason why and proceed with request.) (Agent: If yes, when and what did the pharmacy advise?)  This is the patient's preferred pharmacy:   [facility] [place] [address] [place] [address] Phone: [phone] Fax: [phone]  Is this the correct pharmacy for this prescription? Yes If no, delete pharmacy and type the correct one.   Has the prescription been filled recently? No  Is the patient out of the medication? Yes  Has the patient been seen for an appointment in the last year OR does the patient have an upcoming appointment? Yes  Can we respond through MyChart? Yes  Agent: Please be advised that Rx refills may take up to 3 business days. We ask that you follow-up with your pharmacy.

## 2023-10-11 ENCOUNTER — Other Ambulatory Visit: Payer: Self-pay | Admitting: Internal Medicine

## 2023-10-11 DIAGNOSIS — I739 Peripheral vascular disease, unspecified: Secondary | ICD-10-CM

## 2023-10-11 DIAGNOSIS — I2510 Atherosclerotic heart disease of native coronary artery without angina pectoris: Secondary | ICD-10-CM

## 2023-10-15 ENCOUNTER — Ambulatory Visit: Admitting: Podiatry

## 2023-10-15 DIAGNOSIS — B351 Tinea unguium: Secondary | ICD-10-CM | POA: Diagnosis not present

## 2023-10-15 DIAGNOSIS — M79675 Pain in left toe(s): Secondary | ICD-10-CM

## 2023-10-15 DIAGNOSIS — Z89419 Acquired absence of unspecified great toe: Secondary | ICD-10-CM | POA: Diagnosis not present

## 2023-10-15 DIAGNOSIS — Z794 Long term (current) use of insulin: Secondary | ICD-10-CM | POA: Diagnosis not present

## 2023-10-15 DIAGNOSIS — L84 Corns and callosities: Secondary | ICD-10-CM | POA: Diagnosis not present

## 2023-10-15 DIAGNOSIS — E1140 Type 2 diabetes mellitus with diabetic neuropathy, unspecified: Secondary | ICD-10-CM

## 2023-10-15 DIAGNOSIS — M79674 Pain in right toe(s): Secondary | ICD-10-CM | POA: Diagnosis not present

## 2023-10-15 MED ORDER — TORSEMIDE 20 MG PO TABS: ORAL_TABLET | ORAL | 2 refills | Status: AC

## 2023-10-18 ENCOUNTER — Other Ambulatory Visit: Payer: Self-pay | Admitting: Internal Medicine

## 2023-10-18 DIAGNOSIS — E785 Hyperlipidemia, unspecified: Secondary | ICD-10-CM

## 2023-10-21 ENCOUNTER — Encounter: Payer: Self-pay | Admitting: Podiatry

## 2023-10-21 ENCOUNTER — Other Ambulatory Visit: Payer: Self-pay | Admitting: Internal Medicine

## 2023-10-21 DIAGNOSIS — J410 Simple chronic bronchitis: Secondary | ICD-10-CM

## 2023-10-21 NOTE — Progress Notes (Signed)
 Subjective:  Patient ID: Nicholas Whitehead, male    DOB: January 28, 1955,  MRN: [id number]  Nicholas Whitehead presents to clinic today for: at risk foot care. Patient has h/o NIDDM, neuropathy with amputation of partial amputation of L hallux and preulcerative lesion(s) of both feet and painful mycotic toenails that limit ambulation. Painful toenails interfere with ambulation. Aggravating factors include wearing enclosed shoe gear. Pain is relieved with periodic professional debridement. Painful preulcerative lesion(s) is/are aggravated when weightbearing with and without shoegear. Pain is relieved with periodic professional debridement.  Chief Complaint  Patient presents with   Toe Pain    Diabetic foot care. Dr. Debby Molt. A1C 7.6. Last office was in August    PCP is Molt Debby CROME, MD.  Allergies  Allergen Reactions   Benazepril  Cough   Molds & Smuts     Hard to breathe   Pravastatin  Other (See Comments)    myositis   Glipizide Nausea Only    Review of Systems: Negative except as noted in the HPI.  Objective: No changes noted in today's physical examination. There were no vitals filed for this visit.  Nicholas Whitehead is a pleasant 68 y.o. male WD, WN in NAD. AAO x 3.  Vascular Examination: CFT <3 seconds b/l. DP pulses faintly palpable b/l. PT pulses palpable b/l. Digital hair absent. Skin temperature gradient warm to warm b/l. No pain with calf compression. No ischemia or gangrene. No cyanosis or clubbing noted b/l. No edema noted b/l LE.   Neurological Examination: Pt has subjective symptoms of neuropathy. Protective sensation diminished with 10g monofilament b/l.  Dermatological Examination: Pedal skin warm and supple b/l. No open wounds. No interdigital macerations. Toenails right hallux and 2-5 b/l thick, discolored, elongated with subungual debris and pain on dorsal palpation. Preulcerative lesion noted plantar heel pad of both feet and distal tip of left 2nd toe. There is visible  subdermal hemorrhage. There is no surrounding erythema, no edema, no drainage, no odor, no fluctuance.  Musculoskeletal Examination: Muscle strength 5/5 to all lower extremity muscle groups bilaterally. Lower extremity amputation(s): partial amputation of L hallux. Severe hammertoe deformity noted 2-5 bilaterally.  Radiographs: None  Last HgA1c:      Latest Ref Rng & Units 08/20/2023    3:22 PM 06/05/2023    9:52 AM 03/02/2023   10:02 AM 11/29/2022    9:50 AM  Hemoglobin A1C  Hemoglobin-A1c 4.6 - 6.5 % 7.6  7.5  7.7  8.1    Assessment/Plan: 1. Pain due to onychomycosis of toenails of both feet   2. Pre-ulcerative corn or callous   3. History of amputation of great toe   4. Type 2 diabetes mellitus with diabetic neuropathy, with long-term current use of insulin  [facility])    Consent given for treatment. Patient examined. All patient's and/or POA's questions/concerns addressed on today's visit. Mycotic toenails right hallux and b/l 2-5 debrided in length and girth without incident. Preulcerative lesion(s) plantar heel pad of both feet and distal tip of left 2nd toe pared with sharp debridement without incident.Continue daily foot inspections and monitor blood glucose per PCP/Endocrinologist's recommendations. Continue soft, supportive shoe gear daily. Report any pedal injuries to medical professional. Call office if there are any quesitons/concerns. Return in about 9 weeks (around 12/17/2023).  Nicholas Whitehead, DPM      [place] LOCATION: [address]  [place], [place] [address]                   Office [phone]   [facility] LOCATION: [address] [place], [place] [address] Office [phone]

## 2023-11-19 ENCOUNTER — Telehealth: Payer: Self-pay

## 2023-11-19 ENCOUNTER — Other Ambulatory Visit (HOSPITAL_COMMUNITY): Payer: Self-pay

## 2023-11-19 NOTE — Telephone Encounter (Signed)
 Pharmacy Patient Advocate Encounter   Received notification from CoverMyMeds that prior authorization for Pitavastatin  1mg  tabs is required/requested.   Insurance verification completed.   The patient is insured through [employer].   Per test claim: Refill too soon. PA is not needed at this time. Medication was filled 11/13/23. Next eligible fill date is 12/06/23.   The insurance will pay for Livalo  1mg  tabs.

## 2023-11-24 ENCOUNTER — Other Ambulatory Visit: Payer: Self-pay | Admitting: Internal Medicine

## 2023-11-24 DIAGNOSIS — F32A Depression, unspecified: Secondary | ICD-10-CM

## 2023-11-24 DIAGNOSIS — E1140 Type 2 diabetes mellitus with diabetic neuropathy, unspecified: Secondary | ICD-10-CM

## 2023-11-24 DIAGNOSIS — I10 Essential (primary) hypertension: Secondary | ICD-10-CM

## 2023-11-24 DIAGNOSIS — F409 Phobic anxiety disorder, unspecified: Secondary | ICD-10-CM

## 2023-11-27 ENCOUNTER — Other Ambulatory Visit: Payer: Self-pay | Admitting: "Endocrinology

## 2023-11-27 DIAGNOSIS — E1165 Type 2 diabetes mellitus with hyperglycemia: Secondary | ICD-10-CM

## 2023-12-19 ENCOUNTER — Encounter: Payer: Self-pay | Admitting: Endocrinology

## 2023-12-19 ENCOUNTER — Other Ambulatory Visit: Payer: Self-pay | Admitting: Internal Medicine

## 2023-12-19 ENCOUNTER — Ambulatory Visit: Payer: Self-pay | Admitting: Endocrinology

## 2023-12-19 ENCOUNTER — Ambulatory Visit (INDEPENDENT_AMBULATORY_CARE_PROVIDER_SITE_OTHER): Admitting: Endocrinology

## 2023-12-19 VITALS — BP 112/50 | HR 71 | Resp 16 | Ht 72.0 in | Wt 185.2 lb

## 2023-12-19 DIAGNOSIS — E1140 Type 2 diabetes mellitus with diabetic neuropathy, unspecified: Secondary | ICD-10-CM | POA: Diagnosis not present

## 2023-12-19 DIAGNOSIS — Z794 Long term (current) use of insulin: Secondary | ICD-10-CM

## 2023-12-19 DIAGNOSIS — E1165 Type 2 diabetes mellitus with hyperglycemia: Secondary | ICD-10-CM

## 2023-12-19 DIAGNOSIS — F5105 Insomnia due to other mental disorder: Secondary | ICD-10-CM

## 2023-12-19 LAB — POCT GLYCOSYLATED HEMOGLOBIN (HGB A1C): Hemoglobin A1C: 8 % — AB (ref 4.0–5.6)

## 2023-12-19 MED ORDER — DAPAGLIFLOZIN PROPANEDIOL 10 MG PO TABS: 10.0000 mg | ORAL_TABLET | Freq: Every day | ORAL | 3 refills | Status: AC

## 2023-12-19 MED ORDER — TOUJEO SOLOSTAR 300 UNIT/ML ~~LOC~~ SOPN: 45.0000 [IU] | PEN_INJECTOR | Freq: Every day | SUBCUTANEOUS | 3 refills | Status: AC

## 2023-12-19 MED ORDER — PIOGLITAZONE HCL 30 MG PO TABS: ORAL_TABLET | ORAL | 3 refills | Status: AC

## 2023-12-19 NOTE — Progress Notes (Signed)
 Outpatient Endocrinology Note Nicholas Duce, MD  12/19/23  Patient's Name: Nicholas Whitehead    DOB: April 28, 1955    MRN: [id number]                                                    REASON OF VISIT: Follow up for type 2 diabetes mellitus  PCP: Joshua Debby CROME, MD  HISTORY OF PRESENT ILLNESS:   Nicholas Whitehead is a 68 y.o. old male with past medical history listed below, is here for follow up for type 2 diabetes mellitus.   Pertinent Diabetes History: Patient was diagnosed with type 2 diabetes mellitus in 1998.  Insulin  therapy was started around 2006. He requires large doses of insulin  especially basal insulin .  Chronic Diabetes Complications : Retinopathy: no. Last ophthalmology exam was done on 08/2022, annually, following with ophthalmology regularly.  Nephropathy: no, on ACE/ARB /olmesartan . Peripheral neuropathy: yes, on Has had paresthesiae in feet and legs, these are treated with gabapentin  300 mg 3 times a day.  He follows up with podiatry as well. Coronary artery disease: no Stroke: no  Relevant comorbidities and cardiovascular risk factors: Obesity: no Body mass index is 25.12 kg/m.  Hypertension: Yes  Hyperlipidemia : Yes, on statin   Current / Home Diabetic regimen includes:   Insulin  regimen: Toujeo : 35 units bid, mostly taking once a day; Humalog  25-30 units before meals two - three times a day.        Oral hypoglycemic drugs: Actos  30 mg, Ozempic  0.5 weekly, Farxiga  10mg  daily.    Prior diabetic medications: Invokana  in the past, increase urgency and frequency of urination.  Had nausea with Ozempic  1 mg.  Glycemic data:    CONTINUOUS GLUCOSE MONITORING SYSTEM (CGMS) INTERPRETATION:         FreeStyle Libre 3 CGM-  Sensor Download (Sensor download was reviewed and summarized below.) Dates: November 27 to December 10 , 2025, 14 days Sensor Average: 187 Glucose Management Indicator: 7.8%  % data captured: 96%    Impression: Occasional hyperglycemia  postprandially with blood sugar in 250-280 range especially with supper and some time with other meals of the day lunch and breakfast.  Blood sugar with mild hyperglycemia throughout overnight and in the afternoon.  No hypoglycemia.  Hypoglycemia: Patient has no hypoglycemic episodes. Patient has hypoglycemia awareness.  Factors modifying glucose control: 1.  Diabetic diet assessment: 3 meals a day.  Usually avoiding high-fat meals.  2.  Staying active or exercising: No formal exercise.  3.  Medication compliance: compliant most of the time.  Interval history  Hemoglobin A1c worsened to 8%.  Patient reports lately he has been busy with taking care of ill friend.  He has been almost always taking Toujeo  once a day 35 units and taking Humalog  usually 2 times a day with meals and 30 units each time.  He denies hypoglycemia and hypoglycemic symptoms.  No noted hypoglycemia on CGM data. No vision problem.  No other complaints today.  REVIEW OF SYSTEMS As per history of present illness.   PAST MEDICAL HISTORY: Past Medical History:  Diagnosis Date   Anxiety    Cataract    right    Depression    Diabetes mellitus without complication (HCC)    DM (diabetes mellitus screen)    HIV disease (HCC) 09/12/1995   HIV infection (HCC)  Hypercholesterolemia    Hyperlipidemia    Hypertension     PAST SURGICAL HISTORY: Past Surgical History:  Procedure Laterality Date   COLONOSCOPY WITH PROPOFOL   01/16/2012   Procedure: COLONOSCOPY WITH PROPOFOL ;  Surgeon: Gladis MARLA Louder, MD;  Location: [facility];  Service: Endoscopy;  Laterality: N/A;   EYE SURGERY  01/10/2003   catarct surgery   foot sugery Bilateral 12/29/2022   optic lens surgery  01/10/2003    ALLERGIES: Allergies  Allergen Reactions   Benazepril  Cough   Molds & Smuts     Hard to breathe   Pravastatin  Other (See Comments)    myositis   Glipizide Nausea Only    FAMILY HISTORY:  Family History  Problem Relation Age of  Onset   Arthritis Mother    Diabetes Mother    Heart disease Mother    Stroke Mother    Arthritis Father    Diabetes Father    Heart disease Father     SOCIAL HISTORY: Social History   Socioeconomic History   Marital status: Single    Spouse name: Not on file   Number of children: Not on file   Years of education: Not on file   Highest education level: Not on file  Occupational History   Occupation: disability  Tobacco Use   Smoking status: Every Day    Current packs/day: 0.25    Types: Cigarettes   Smokeless tobacco: Never   Tobacco comments:    Occ smoking/ trying to quit  Vaping Use   Vaping status: Never Used  Substance and Sexual Activity   Alcohol  use: No    Alcohol /week: 0.0 standard drinks of alcohol     Comment: socially   Drug use: No   Sexual activity: Never    Birth control/protection: Other-see comments    Comment: declined condoms  Other Topics Concern   Not on file  Social History Narrative   Lives alone   Social Drivers of Health   Financial Resource Strain: Medium Risk (02/05/2023)   Overall Financial Resource Strain (CARDIA)    Difficulty of Paying Living Expenses: Somewhat hard  Food Insecurity: Food Insecurity Present (02/05/2023)   Hunger Vital Sign    Worried About Running Out of Food in the Last Year: Sometimes true    Ran Out of Food in the Last Year: Sometimes true  Transportation Needs: No Transportation Needs (02/05/2023)   PRAPARE - Administrator, Civil Service (Medical): No    Lack of Transportation (Non-Medical): No  Physical Activity: Inactive (02/05/2023)   Exercise Vital Sign    Days of Exercise per Week: 0 days    Minutes of Exercise per Session: 0 min  Stress: No Stress Concern Present (02/05/2023)   [employer] of Occupational Health - Occupational Stress Questionnaire    Feeling of Stress : Only a little  Social Connections: Unknown (02/05/2023)   Social Connection and Isolation Panel    Frequency of  Communication with Friends and Family: More than three times a week    Frequency of Social Gatherings with Friends and Family: Never    Attends Religious Services: Not on Marketing Executive or Organizations: Not on file    Attends Banker Meetings: Not on file    Marital Status: Never married    MEDICATIONS:  Current Outpatient Medications  Medication Sig Dispense Refill   ACCU-CHEK GUIDE TEST test strip TABLET BLOOD SUGAR FOUR TIMES DAILY 400 strip 1   albuterol  (  VENTOLIN  HFA) 108 (90 Base) MCG/ACT inhaler INHALE TWO PUFFS INTO THE LUNGS EVERY 6 HOURS AS NEEDED FOR WHEEZING OR FOR SHORTNESS OF BREATH 6.7 g 3   Alcohol  Swabs  (B-D SINGLE USE SWABS  REGULAR) PADS USE AS DIRECTED 300 each 1   ASPIRIN  EC ADULT LOW DOSE 81 MG tablet TAKE ONE TABLET BY MOUTH DAILY AT 9AM 90 tablet 1   bictegravir-emtricitabine-tenofovir  AF (BIKTARVY) 50-200-25 MG TABS tablet Take 1 tablet by mouth daily. 30 tablet 11   Blood Glucose Monitoring Suppl (ACCU-CHEK GUIDE) w/Device KIT Use to check blood sugars four times a day 1 kit 0   cetirizine (ZYRTEC) 10 MG tablet Take 10 mg by mouth daily.     Cholecalciferol  2000 units TABS Take 1 tablet (2,000 Units total) by mouth daily. 90 tablet 1   Continuous Glucose Sensor (FREESTYLE LIBRE 3 PLUS SENSOR) MISC 1 each by Does not apply route continuous. Change every 15 days. 6 each 4   DULoxetine  (CYMBALTA ) 60 MG capsule TAKE ONE CAPSULE (60 MG) BY MOUTH DAILY AT 9AM 90 capsule 0   gabapentin  (NEURONTIN ) 300 MG capsule TAKE ONE CAPSULE (300MG  TOTAL) BY MOUTH THREE TIMES DAILY AT 9AM, 3PM AND 9PM 270 capsule 0   Glucagon  (GVOKE HYPOPEN  2-PACK) 1 MG/0.2ML SOAJ Inject 1 Act into the skin daily as needed. 2 mL 6   Glycopyrrolate -Formoterol  (BEVESPI  AEROSPHERE) 9-4.8 MCG/ACT AERO Inhale 2 puffs into the lungs 2 (two) times daily. 32.1 g 1   HUMALOG  KWIKPEN 100 UNIT/ML KwikPen ADMINISTER 25-30 UNITS UNDER THE SKIN THREE TIMES DAILY 45 mL 4   Insulin  Pen  Needle (TECHLITE PEN NEEDLES) 31G X 8 MM MISC USE TO INJECT INSULIN  AS DIRECTED THREE TIMES DAILY WITH HUMALOG  400 each 1   Lancets MISC Inject 1 Act into the skin 4 (four) times daily. Use to test blood sugar tid. DX: E11.8 300 each 3   mirtazapine  (REMERON ) 7.5 MG tablet TAKE ONE TABLET BY MOUTH DAILY AT 9PM 90 tablet 11   olmesartan  (BENICAR ) 40 MG tablet TAKE ONE TABLET (40MG ) BY MOUTH DAILY AT 9AM 90 tablet 0   OZEMPIC , 0.25 OR 0.5 MG/DOSE, 2 MG/3ML SOPN INJECT 0.5 MG SUBCUTANEOUSLY ONCE A WEEK 9 mL 3   Pitavastatin  Calcium  (LIVALO ) 1 MG TABS TAKE ONE TABLET (1 MG) BY MOUTH DAILY AT 9AM 90 tablet 1   Suvorexant  (BELSOMRA ) 20 MG TABS TAKE ONE TABLET (20MG  TOTAL) BY MOUTH DAILY AT BEDTIME 90 tablet 0   torsemide  (DEMADEX ) 20 MG tablet TAKE ONE TABLET BY MOUTH ON MONDAY, WEDNESDAY AND FRIDAY 36 tablet 2   traZODone  (DESYREL ) 150 MG tablet TAKE THREE TABLETS (450MG  TOTAL) BY MOUTH DAILY AT 9PM AT BEDTIME 270 tablet 0   dapagliflozin  propanediol (FARXIGA ) 10 MG TABS tablet Take 1 tablet (10 mg total) by mouth daily. 90 tablet 3   pioglitazone  (ACTOS ) 30 MG tablet TAKE 1 TABLET(30 MG) BY MOUTH DAILY 90 tablet 3   TOUJEO  SOLOSTAR 300 UNIT/ML Solostar Pen Inject 45 Units into the skin daily. 15 mL 3   No current facility-administered medications for this visit.    PHYSICAL EXAM: Vitals:   12/19/23 0955  BP: (!) 112/50  Pulse: 71  Resp: 16  SpO2: 93%  Weight: 185 lb 3.2 oz (84 kg)  Height: 6' (1.829 m)     Body mass index is 25.12 kg/m.  Wt Readings from Last 3 Encounters:  12/19/23 185 lb 3.2 oz (84 kg)  09/18/23 184 lb (83.5 kg)  09/17/23 186 lb 6.4  oz (84.6 kg)    General: Well developed, well nourished male in no apparent distress.  HEENT: AT/[place], no external lesions.  Eyes: Conjunctiva clear and no icterus. Neck: Neck supple  Lungs: Respirations not labored Neurologic: Alert, oriented, normal speech Extremities / Skin: Dry.   Psychiatric: Does not appear depressed or  anxious  Diabetic Foot Exam - Simple   No data filed    LABS Reviewed Lab Results  Component Value Date   HGBA1C 8.0 (A) 12/19/2023   HGBA1C 7.6 (H) 08/20/2023   HGBA1C 7.5 (H) 06/05/2023   Lab Results  Component Value Date   FRUCTOSAMINE 316 (H) 03/10/2021   FRUCTOSAMINE 367 (H) 02/01/2021   FRUCTOSAMINE 280 09/11/2019   Lab Results  Component Value Date   CHOL 137 03/08/2023   HDL 50.50 03/08/2023   LDLCALC 58 03/08/2023   TRIG 143.0 03/08/2023   CHOLHDL 3 03/08/2023   Lab Results  Component Value Date   MICRALBCREAT Unable to calculate 08/20/2023   Lab Results  Component Value Date   CREATININE 1.33 08/20/2023   Lab Results  Component Value Date   GFR 55.98 (L) 08/20/2023    ASSESSMENT / PLAN  1. Type 2 diabetes mellitus with diabetic neuropathy, with long-term current use of insulin  (HCC)   2. Uncontrolled type 2 diabetes mellitus with hyperglycemia, with long-term current use of insulin  (HCC)   3. Uncontrolled type 2 diabetes mellitus with hyperglycemia (HCC)     Diabetes Mellitus type 2, complicated by diabetic neuropathy. - Diabetic status / severity: Uncontrolled.  Lab Results  Component Value Date   HGBA1C 8.0 (A) 12/19/2023    - Hemoglobin A1c goal : <6.5%  Discussed about increasing dose of Ozempic , he wants to stay on the current dose.  He is concerned about potential GI side effects.  He has been mostly taking Toujeo  once a day and 35 units.  He reports he has been taking Humalog  30 units with meals usually 2 times a day with no hypoglycemia.  Diabetes regimen/insulin  adjusted as follows   - Medications: Adjusted as follows  Insulin  regimen: Take Toujeo  once a day and increase to 45 units.   Continue Humalog  25-30 units before meals 2-3 times a day.   Discussed about be watchful for hypoglycemia and if develop after call our clinic we need to adjust the dose of Humalog .     Oral hypoglycemic drugs: Actos  30 mg, Ozempic  0.5 weekly,  Farxiga  10mg  daily.  - Home glucose testing: CGM Freestyle libre 3+ check as needed.  - Discussed/ Gave Hypoglycemia treatment plan.  # Consult : not required at this time.   # Annual urine for microalbuminuria/ creatinine ratio, no microalbuminuria currently, continue ACE/ARB /olmesartan . Last  Lab Results  Component Value Date   MICRALBCREAT Unable to calculate 08/20/2023    # Foot check nightly / neuropathy, continue gabapentin  300 mg 3 times a day.  Managed by PCP.  # Annual dilated diabetic eye exams.   - Diet: Make healthy diabetic food choices - Life style / activity / exercise: Discussed.  2. Blood pressure  -  BP Readings from Last 1 Encounters:  12/19/23 (!) 112/50    - Control is in target.  - No change in current plans.  3. Lipid status / Hyperlipidemia - Last  Lab Results  Component Value Date   LDLCALC 58 03/08/2023   - Continue pitavastatin  1 mg daily.  Managed by primary care provider.  Diagnoses and all orders for this visit:  Type  2 diabetes mellitus with diabetic neuropathy, with long-term current use of insulin  (HCC) -     POCT glycosylated hemoglobin (Hb A1C) -     pioglitazone  (ACTOS ) 30 MG tablet; TAKE 1 TABLET(30 MG) BY MOUTH DAILY  Uncontrolled type 2 diabetes mellitus with hyperglycemia, with long-term current use of insulin  (HCC) -     dapagliflozin  propanediol (FARXIGA ) 10 MG TABS tablet; Take 1 tablet (10 mg total) by mouth daily. -     TOUJEO  SOLOSTAR 300 UNIT/ML Solostar Pen; Inject 45 Units into the skin daily.  Uncontrolled type 2 diabetes mellitus with hyperglycemia (HCC)    DISPOSITION Follow up in clinic in 3 months suggested.    All questions answered and patient verbalized understanding of the plan.  Skylur Fuston, MD [facility] [address] [place], [address] [place], [place] [address] Phone # [phone]  At least part of this note was generated using voice recognition software.  Inadvertent word errors may have occurred, which were not recognized during the proofreading process.

## 2023-12-19 NOTE — Patient Instructions (Signed)
 Take Toujeo  45 units once a day and rest medications same.

## 2023-12-20 ENCOUNTER — Other Ambulatory Visit: Payer: Self-pay | Admitting: Internal Medicine

## 2023-12-20 ENCOUNTER — Encounter: Payer: Self-pay | Admitting: Podiatry

## 2023-12-20 ENCOUNTER — Encounter

## 2023-12-20 ENCOUNTER — Ambulatory Visit: Admitting: Podiatry

## 2023-12-20 ENCOUNTER — Other Ambulatory Visit: Payer: Self-pay | Admitting: Podiatry

## 2023-12-20 DIAGNOSIS — E11621 Type 2 diabetes mellitus with foot ulcer: Secondary | ICD-10-CM

## 2023-12-20 DIAGNOSIS — E1165 Type 2 diabetes mellitus with hyperglycemia: Secondary | ICD-10-CM

## 2023-12-20 DIAGNOSIS — L97522 Non-pressure chronic ulcer of other part of left foot with fat layer exposed: Secondary | ICD-10-CM | POA: Diagnosis not present

## 2023-12-20 DIAGNOSIS — I96 Gangrene, not elsewhere classified: Secondary | ICD-10-CM

## 2023-12-25 NOTE — Progress Notes (Signed)
 Subjective:  Patient ID: Nicholas Whitehead, male    DOB: [birth year],  MRN: [id number]  Chief Complaint  Patient presents with   Nail Problem    Thick painful toenails, 9 week follow up    Diabetes    A1C  8.0   Diabetic Ulcer    Left second toe - not improving, still open, toe turning dark    68 y.o. male presents with the above complaint.  Patient presents with complaint of left second digit turning dark.  Is not improving.  He wanted to get it evaluated he was seeing Dr. May today for routine footcare.  He denies any other acute issues he has vascular history as well in the past.  He has not had a repeat ultrasound in quite some time.   Review of Systems: Negative except as noted in the HPI. Denies N/V/F/Ch.  Past Medical History:  Diagnosis Date   Anxiety    Cataract    right    Depression    Diabetes mellitus without complication (HCC)    DM (diabetes mellitus screen)    HIV disease (HCC) 09/12/1995   HIV infection (HCC)    Hypercholesterolemia    Hyperlipidemia    Hypertension    Current Medications[1]  Tobacco Use History[2]  Allergies[3] Objective:  There were no vitals filed for this visit. There is no height or weight on file to calculate BMI. Constitutional Well developed. Well nourished.  Vascular Dorsalis pedis pulses palpable bilaterally. Posterior tibial pulses palpable bilaterally. Capillary refill normal to all digits.  No cyanosis or clubbing noted. Pedal hair growth normal.  Neurologic Normal speech. Oriented to person, place, and time. Epicritic sensation to light touch grossly present bilaterally.  Dermatologic Nails well groomed and normal in appearance. No open wounds. No skin lesions.  Orthopedic: Pain on palpation to the left second digit some duskiness and discoloration noted to the second toe.  Appear to be very stable.  No open wounds or lesion noted no wet gangrene noted.  No malodor present no erythema noted.   Radiographs: 3 views  of skeletally mature the left foot: No signs of osteomyelitis noted previous amputation noted of the hallux.  Severe flatfoot deformity noted plantar heel spurring noted about midfoot arthritis noted no gross infection noted no soft tissue emphysema noted Assessment:   1. Diabetic ulcer of toe of left foot associated with type 2 diabetes mellitus, with fat layer exposed (HCC)   2. Gangrene of toe of left foot (HCC)    Plan:  Patient was evaluated and treated and all questions answered.  Left second digit beginning signs of gangrene - All questions and concerns were discussed with the patient in extensive detail I encouraged him to do Betadine wet-to-dry dressing changes daily he is at high risk of undergoing amputation of that toe given his past history.  I also ordered another ABIs PVRs to assess vascular flow to the left lower extremity he states understand and will get it scheduled.  Also encouraged him to make his vascular appointment as well he states understanding will do so - Shoe gear modification discussed - At this time patient high risk for undergoing major amputation versus digital amputation given his vascular history.  Encouraged glucose control - For now we can monitor his as an outpatient as he is beginning signs of gangrene.  If it worsens or gets infected encouraged him to go to the emergency room he states understanding  No follow-ups on file.     [1]  Current Outpatient Medications:    clonazePAM  (KLONOPIN ) 1 MG tablet, Oral, Disp: , Rfl:    Docusate Sodium 100 MG capsule, Oral, Disp: , Rfl:    emtricitabine-rilpivir-tenofovir  DF (COMPLERA ) 200-25-300 MG tablet, Oral, Disp: , Rfl:    insulin  aspart (NOVOLOG ) 100 UNIT/ML injection, Subcutaneous, Disp: , Rfl:    irbesartan  (AVAPRO ) 300 MG tablet, Oral, Disp: , Rfl:    Nebivolol  HCl (BYSTOLIC ) 20 MG TABS, Oral, Disp: , Rfl:    pravastatin  (PRAVACHOL ) 40 MG tablet, Oral, Disp: , Rfl:    sertraline  (ZOLOFT ) 100 MG tablet,  Oral, Disp: , Rfl:    sildenafil  (REVATIO ) 20 MG tablet, Oral, Disp: , Rfl:    triamcinolone  cream (KENALOG ) 0.5 %, External, Disp: , Rfl:    ACCU-CHEK GUIDE TEST test strip, TABLET BLOOD SUGAR FOUR TIMES DAILY, Disp: 400 strip, Rfl: 1   albuterol  (VENTOLIN  HFA) 108 (90 Base) MCG/ACT inhaler, INHALE TWO PUFFS INTO THE LUNGS EVERY 6 HOURS AS NEEDED FOR WHEEZING OR FOR SHORTNESS OF BREATH, Disp: 6.7 g, Rfl: 3   Alcohol Swabs (B-D SINGLE USE SWABS REGULAR) PADS, USE AS DIRECTED, Disp: 300 each, Rfl: 1   ASPIRIN  EC ADULT LOW DOSE 81 MG tablet, TAKE ONE TABLET BY MOUTH DAILY AT 9AM, Disp: 90 tablet, Rfl: 1   bictegravir-emtricitabine-tenofovir  AF (BIKTARVY) 50-200-25 MG TABS tablet, Take 1 tablet by mouth daily., Disp: 30 tablet, Rfl: 11   Blood Glucose Monitoring Suppl (ACCU-CHEK GUIDE) w/Device KIT, Use to check blood sugars four times a day, Disp: 1 kit, Rfl: 0   cetirizine (ZYRTEC) 10 MG tablet, Take 10 mg by mouth daily., Disp: , Rfl:    Cholecalciferol  2000 units TABS, Take 1 tablet (2,000 Units total) by mouth daily., Disp: 90 tablet, Rfl: 1   Continuous Glucose Sensor (FREESTYLE LIBRE 3 PLUS SENSOR) MISC, 1 each by Does not apply route continuous. Change every 15 days., Disp: 6 each, Rfl: 4   dapagliflozin  propanediol (FARXIGA ) 10 MG TABS tablet, Take 1 tablet (10 mg total) by mouth daily., Disp: 90 tablet, Rfl: 3   DULoxetine  (CYMBALTA ) 60 MG capsule, TAKE ONE CAPSULE (60 MG) BY MOUTH DAILY AT 9AM, Disp: 90 capsule, Rfl: 0   gabapentin  (NEURONTIN ) 300 MG capsule, TAKE ONE CAPSULE (300MG  TOTAL) BY MOUTH THREE TIMES DAILY AT 9AM, 3PM AND 9PM, Disp: 270 capsule, Rfl: 0   Glucagon  (GVOKE HYPOPEN  2-PACK) 1 MG/0.2ML SOAJ, Inject 1 Act into the skin daily as needed., Disp: 2 mL, Rfl: 6   Glycopyrrolate -Formoterol  (BEVESPI  AEROSPHERE) 9-4.8 MCG/ACT AERO, Inhale 2 puffs into the lungs 2 (two) times daily., Disp: 32.1 g, Rfl: 1   HUMALOG  KWIKPEN 100 UNIT/ML KwikPen, ADMINISTER 25-30 UNITS UNDER THE SKIN  THREE TIMES DAILY, Disp: 45 mL, Rfl: 4   Insulin  Pen Needle (TECHLITE PEN NEEDLES) 31G X 8 MM MISC, USE TO INJECT INSULIN  AS DIRECTED THREE TIMES DAILY WITH HUMALOG , Disp: 400 each, Rfl: 1   Lancets MISC, Inject 1 Act into the skin 4 (four) times daily. Use to test blood sugar tid. DX: E11.8, Disp: 300 each, Rfl: 3   mirtazapine  (REMERON ) 7.5 MG tablet, TAKE ONE TABLET BY MOUTH DAILY AT 9PM, Disp: 90 tablet, Rfl: 11   olmesartan  (BENICAR ) 40 MG tablet, TAKE ONE TABLET (40MG ) BY MOUTH DAILY AT 9AM, Disp: 90 tablet, Rfl: 0   OZEMPIC , 0.25 OR 0.5 MG/DOSE, 2 MG/3ML SOPN, INJECT 0.5 MG SUBCUTANEOUSLY ONCE A WEEK, Disp: 9 mL, Rfl: 3   pioglitazone  (ACTOS ) 30 MG tablet, TAKE 1 TABLET(30 MG) BY  MOUTH DAILY, Disp: 90 tablet, Rfl: 3   Pitavastatin  Calcium  (LIVALO ) 1 MG TABS, TAKE ONE TABLET (1 MG) BY MOUTH DAILY AT 9AM, Disp: 90 tablet, Rfl: 1   Suvorexant  (BELSOMRA ) 20 MG TABS, TAKE ONE TABLET (20 MG TOTAL) BY MOUTH DAILY AT BEDTIME, Disp: 90 tablet, Rfl: 0   torsemide  (DEMADEX ) 20 MG tablet, TAKE ONE TABLET BY MOUTH ON MONDAY, WEDNESDAY AND FRIDAY, Disp: 36 tablet, Rfl: 2   TOUJEO  SOLOSTAR 300 UNIT/ML Solostar Pen, Inject 45 Units into the skin daily., Disp: 15 mL, Rfl: 3   traZODone  (DESYREL ) 150 MG tablet, TAKE THREE TABLETS (450MG  TOTAL) BY MOUTH DAILY AT 9PM AT BEDTIME, Disp: 270 tablet, Rfl: 0 [2]  Social History Tobacco Use  Smoking Status Every Day   Current packs/day: 0.25   Types: Cigarettes  Smokeless Tobacco Never  Tobacco Comments   Occ smoking/ trying to quit  [3]  Allergies Allergen Reactions   Benazepril  Cough   Molds & Smuts     Hard to breathe   Pravastatin  Other (See Comments)    myositis   Glipizide Nausea Only

## 2024-01-05 ENCOUNTER — Other Ambulatory Visit: Payer: Self-pay | Admitting: Internal Medicine

## 2024-01-05 DIAGNOSIS — J410 Simple chronic bronchitis: Secondary | ICD-10-CM

## 2024-01-08 ENCOUNTER — Other Ambulatory Visit: Payer: Self-pay | Admitting: Internal Medicine

## 2024-01-08 DIAGNOSIS — E1165 Type 2 diabetes mellitus with hyperglycemia: Secondary | ICD-10-CM

## 2024-01-09 ENCOUNTER — Ambulatory Visit (INDEPENDENT_AMBULATORY_CARE_PROVIDER_SITE_OTHER)

## 2024-01-09 ENCOUNTER — Ambulatory Visit (INDEPENDENT_AMBULATORY_CARE_PROVIDER_SITE_OTHER): Admitting: Podiatry

## 2024-01-09 ENCOUNTER — Ambulatory Visit (HOSPITAL_COMMUNITY)
Admission: RE | Admit: 2024-01-09 | Discharge: 2024-01-09 | Disposition: A | Discharge status: Home / Self Care | Source: Ambulatory Visit | Attending: Podiatry | Admitting: Podiatry

## 2024-01-09 DIAGNOSIS — I96 Gangrene, not elsewhere classified: Secondary | ICD-10-CM | POA: Insufficient documentation

## 2024-01-09 DIAGNOSIS — Z01818 Encounter for other preprocedural examination: Secondary | ICD-10-CM | POA: Diagnosis not present

## 2024-01-09 LAB — VAS US ABI WITH/WO TBI
Left ABI: 1.19
Right ABI: 1.12

## 2024-01-09 NOTE — Progress Notes (Signed)
 "  Subjective:  Patient ID: Nicholas Whitehead, male    DOB: 04-09-1955,  MRN: [id number]  Chief Complaint  Patient presents with   Diabetic Ulcer    68 y.o. male presents with the above complaint.  Patient presents with complaint of left second digit turning dark.  Is not improving.  He states it continues to get worse.  He had his circulation to his he is here to discuss treatment options including amputation   Review of Systems: Negative except as noted in the HPI. Denies N/V/F/Ch.  Past Medical History:  Diagnosis Date   Anxiety    Cataract    right    Depression    Diabetes mellitus without complication (HCC)    DM (diabetes mellitus screen)    HIV disease (HCC) 09/12/1995   HIV infection (HCC)    Hypercholesterolemia    Hyperlipidemia    Hypertension    Current Medications[1]  Tobacco Use History[2]  Allergies[3] Objective:  There were no vitals filed for this visit. There is no height or weight on file to calculate BMI. Constitutional Well developed. Well nourished.  Vascular Dorsalis pedis pulses palpable bilaterally. Posterior tibial pulses palpable bilaterally. Capillary refill normal to all digits.  No cyanosis or clubbing noted. Pedal hair growth normal.  Neurologic Normal speech. Oriented to person, place, and time. Epicritic sensation to light touch grossly present bilaterally.  Dermatologic Nails well groomed and normal in appearance. No open wounds. No skin lesions.  Orthopedic: Pain on palpation to the left second digit some duskiness and discoloration noted to the second toe.  Appear to be very stable.  No open wounds or lesion noted no wet gangrene noted.  No malodor present no erythema noted.   Radiographs: 3 views of skeletally mature the left foot: No signs of osteomyelitis noted previous amputation noted of the hallux.  Severe flatfoot deformity noted plantar heel spurring noted about midfoot arthritis noted no gross infection noted no soft tissue  emphysema noted Assessment:   1. Gangrene of toe of left foot (HCC)   2. Encounter for preoperative examination for general surgical procedure    Plan:  Patient was evaluated and treated and all questions answered.  Left second digit beginning signs of gangrene - All questions and concerns were discussed with the patient in extensive detail I ABIs PVRs were assessed and were completed which shows adequate circulation to both lower extremity.  Patient is experiencing more dark discoloration of the second toe with continuous pain.  At this time patient would like to undergo left second digit elective amputation.  It is becoming more dusky.  Given the finding of dusky changes as well as pain associated with that he will benefit from left second digit amputation. -I encouraged him to do Betadine wet-to-dry dressing.  I discussed with him that his toe gets more wet or becomes infected to go to the emergency room to be amputated he states understanding - For now we will manage this as an outpatient will plan on doing left second digit amputation.  I discussed my preoperative drug postoperative plan with the patient extensive detail he states understand like to proceed with amputation. -Informed surgical risk consent was reviewed and read aloud to the patient.  I reviewed the films.  I have discussed my findings with the patient in great detail.  I have discussed all risks including but not limited to infection, stiffness, scarring, limp, disability, deformity, damage to blood vessels and nerves, numbness, poor healing, need for braces, arthritis,  chronic pain, amputation, death.  All benefits and realistic expectations discussed in great detail.  I have made no promises as to the outcome.  I have provided realistic expectations.  I have offered the patient a 2nd opinion, which they have declined and assured me they preferred to proceed despite the risks  No follow-ups on file.      [1]  Current  Outpatient Medications:    ACCU-CHEK GUIDE TEST test strip, TABLET BLOOD SUGAR FOUR TIMES DAILY, Disp: 400 strip, Rfl: 1   albuterol  (VENTOLIN  HFA) 108 (90 Base) MCG/ACT inhaler, INHALE TWO PUFFS BY MOUTH INTO THE LUNGS EVERY 6 HOURS AS NEEDED FOR SHORTNESS OF BREATH OR WHEEZING, Disp: 18 g, Rfl: 0   Alcohol Swabs (B-D SINGLE USE SWABS REGULAR) PADS, USE AS DIRECTED, Disp: 300 each, Rfl: 1   ASPIRIN  EC ADULT LOW DOSE 81 MG tablet, TAKE ONE TABLET BY MOUTH DAILY AT 9AM, Disp: 90 tablet, Rfl: 1   bictegravir-emtricitabine-tenofovir  AF (BIKTARVY) 50-200-25 MG TABS tablet, Take 1 tablet by mouth daily., Disp: 30 tablet, Rfl: 11   Blood Glucose Monitoring Suppl (ACCU-CHEK GUIDE) w/Device KIT, Use to check blood sugars four times a day, Disp: 1 kit, Rfl: 0   cetirizine (ZYRTEC) 10 MG tablet, Take 10 mg by mouth daily., Disp: , Rfl:    Cholecalciferol  2000 units TABS, Take 1 tablet (2,000 Units total) by mouth daily., Disp: 90 tablet, Rfl: 1   clonazePAM  (KLONOPIN ) 1 MG tablet, Oral, Disp: , Rfl:    Continuous Glucose Sensor (FREESTYLE LIBRE 3 PLUS SENSOR) MISC, 1 each by Does not apply route continuous. Change every 15 days., Disp: 6 each, Rfl: 4   dapagliflozin  propanediol (FARXIGA ) 10 MG TABS tablet, Take 1 tablet (10 mg total) by mouth daily., Disp: 90 tablet, Rfl: 3   Docusate Sodium 100 MG capsule, Oral, Disp: , Rfl:    DULoxetine  (CYMBALTA ) 60 MG capsule, TAKE ONE CAPSULE (60 MG) BY MOUTH DAILY AT 9AM, Disp: 90 capsule, Rfl: 0   emtricitabine-rilpivir-tenofovir  DF (COMPLERA ) 200-25-300 MG tablet, Oral, Disp: , Rfl:    gabapentin  (NEURONTIN ) 300 MG capsule, TAKE ONE CAPSULE (300MG  TOTAL) BY MOUTH THREE TIMES DAILY AT 9AM, 3PM AND 9PM, Disp: 270 capsule, Rfl: 0   Glucagon  (GVOKE HYPOPEN  2-PACK) 1 MG/0.2ML SOAJ, Inject 1 Act into the skin daily as needed., Disp: 2 mL, Rfl: 6   Glycopyrrolate -Formoterol  (BEVESPI  AEROSPHERE) 9-4.8 MCG/ACT AERO, Inhale 2 puffs into the lungs 2 (two) times daily., Disp:  32.1 g, Rfl: 1   HUMALOG  KWIKPEN 100 UNIT/ML KwikPen, ADMINISTER 25-30 UNITS UNDER THE SKIN THREE TIMES DAILY, Disp: 45 mL, Rfl: 4   insulin  aspart (NOVOLOG ) 100 UNIT/ML injection, Subcutaneous, Disp: , Rfl:    Insulin  Pen Needle (TECHLITE PEN NEEDLES) 31G X 8 MM MISC, USE TO INJECT INSULIN  AS DIRECTED THREE TIMES DAILY WITH HUMALOG , Disp: 400 each, Rfl: 1   irbesartan  (AVAPRO ) 300 MG tablet, Oral, Disp: , Rfl:    Lancets MISC, Inject 1 Act into the skin 4 (four) times daily. Use to test blood sugar tid. DX: E11.8, Disp: 300 each, Rfl: 3   mirtazapine  (REMERON ) 7.5 MG tablet, TAKE ONE TABLET BY MOUTH DAILY AT 9PM, Disp: 90 tablet, Rfl: 11   Nebivolol  HCl (BYSTOLIC ) 20 MG TABS, Oral, Disp: , Rfl:    olmesartan  (BENICAR ) 40 MG tablet, TAKE ONE TABLET (40MG ) BY MOUTH DAILY AT 9AM, Disp: 90 tablet, Rfl: 0   OZEMPIC , 0.25 OR 0.5 MG/DOSE, 2 MG/3ML SOPN, INJECT 0.5 MG SUBCUTANEOUSLY ONCE A  WEEK, Disp: 9 mL, Rfl: 3   pioglitazone  (ACTOS ) 30 MG tablet, TAKE 1 TABLET(30 MG) BY MOUTH DAILY, Disp: 90 tablet, Rfl: 3   Pitavastatin  Calcium  (LIVALO ) 1 MG TABS, TAKE ONE TABLET (1 MG) BY MOUTH DAILY AT 9AM, Disp: 90 tablet, Rfl: 1   pravastatin  (PRAVACHOL ) 40 MG tablet, Oral, Disp: , Rfl:    sertraline  (ZOLOFT ) 100 MG tablet, Oral, Disp: , Rfl:    sildenafil  (REVATIO ) 20 MG tablet, Oral, Disp: , Rfl:    Suvorexant  (BELSOMRA ) 20 MG TABS, TAKE ONE TABLET (20 MG TOTAL) BY MOUTH DAILY AT BEDTIME, Disp: 90 tablet, Rfl: 0   torsemide  (DEMADEX ) 20 MG tablet, TAKE ONE TABLET BY MOUTH ON MONDAY, WEDNESDAY AND FRIDAY, Disp: 36 tablet, Rfl: 2   TOUJEO  SOLOSTAR 300 UNIT/ML Solostar Pen, Inject 45 Units into the skin daily., Disp: 15 mL, Rfl: 3   traZODone  (DESYREL ) 150 MG tablet, TAKE THREE TABLETS (450MG  TOTAL) BY MOUTH DAILY AT 9PM AT BEDTIME, Disp: 270 tablet, Rfl: 0   triamcinolone  cream (KENALOG ) 0.5 %, External, Disp: , Rfl:  [2]  Social History Tobacco Use  Smoking Status Every Day   Current packs/day: 0.25    Types: Cigarettes  Smokeless Tobacco Never  Tobacco Comments   Occ smoking/ trying to quit  [3]  Allergies Allergen Reactions   Benazepril  Cough   Molds & Smuts     Hard to breathe   Pravastatin  Other (See Comments)    myositis   Glipizide Nausea Only   "

## 2024-01-11 ENCOUNTER — Telehealth: Payer: Self-pay | Admitting: Podiatry

## 2024-01-11 NOTE — Telephone Encounter (Signed)
 Called and left message for patient to contact office to schedule surgery.

## 2024-01-16 ENCOUNTER — Telehealth: Payer: Self-pay

## 2024-01-16 NOTE — Telephone Encounter (Signed)
 Copied from CRM [id number]. Topic: General - Other >> Jan 16, 2024  9:42 AM Sophia H wrote: Reason for CRM: updated pharmacy for patient, patient would like RX's to go through [facility] per insurance.

## 2024-01-16 NOTE — Telephone Encounter (Signed)
 Noted

## 2024-01-18 ENCOUNTER — Telehealth: Payer: Self-pay | Admitting: Podiatry

## 2024-01-18 ENCOUNTER — Other Ambulatory Visit: Payer: Self-pay | Admitting: Internal Medicine

## 2024-01-18 DIAGNOSIS — Z794 Long term (current) use of insulin: Secondary | ICD-10-CM

## 2024-01-18 NOTE — Telephone Encounter (Signed)
 DOS- 01/21/2024  2ND AMPUTATION TOE MPJ JOINT LT- 71179  DEVOTED EFFECTIVE DATE- 01/10/2024  DEDUCTIBLE- $760 REMAINING- $760 OOP- $9250 REMAINING- $9250  PER DEVOTED PORTAL, PRIOR AUTH IS NOT REQUIRED FOR CPT CODE 71179.

## 2024-01-18 NOTE — Telephone Encounter (Signed)
 Patient returned call to schedule surgery. Added on to 01/21/2024 due to provider availability. Patient is on ozempic  but will not have had it for 7 days prior to surgery . Patient not on any bloof thinners, patient pharmacy correct in chart.

## 2024-01-21 ENCOUNTER — Other Ambulatory Visit: Payer: Self-pay | Admitting: Podiatry

## 2024-01-21 DIAGNOSIS — I96 Gangrene, not elsewhere classified: Secondary | ICD-10-CM | POA: Diagnosis not present

## 2024-01-21 MED ORDER — OXYCODONE-ACETAMINOPHEN 5-325 MG PO TABS: 1.0000 | ORAL_TABLET | ORAL | 0 refills | Status: DC | PRN

## 2024-01-21 MED ORDER — IBUPROFEN 800 MG PO TABS: 800.0000 mg | ORAL_TABLET | Freq: Four times a day (QID) | ORAL | 1 refills | Status: AC | PRN

## 2024-01-24 ENCOUNTER — Other Ambulatory Visit: Payer: Self-pay | Admitting: Podiatry

## 2024-01-25 ENCOUNTER — Other Ambulatory Visit: Payer: Self-pay | Admitting: Podiatry

## 2024-01-25 MED ORDER — OXYCODONE-ACETAMINOPHEN 5-325 MG PO TABS: 1.0000 | ORAL_TABLET | ORAL | 0 refills | Status: AC | PRN

## 2024-01-29 ENCOUNTER — Telehealth: Payer: Self-pay

## 2024-01-29 NOTE — Telephone Encounter (Signed)
 Patient called stating he has changed insurance from [facility] to [employer]. Per patient his meds need to go to [facility] for refills. Patient attempted to see if he was in need for refills currently but no answer, VM was left. Patient's pharmacy was update in the chart.

## 2024-01-30 ENCOUNTER — Ambulatory Visit

## 2024-01-30 ENCOUNTER — Ambulatory Visit (INDEPENDENT_AMBULATORY_CARE_PROVIDER_SITE_OTHER): Admitting: Podiatry

## 2024-01-30 DIAGNOSIS — I96 Gangrene, not elsewhere classified: Secondary | ICD-10-CM

## 2024-01-30 NOTE — Progress Notes (Signed)
 Patient presents for post-op visit today, POV #1 DOS 01/21/2024 LT 2ND AMPUTATION MPJ JOINT  I been fine..  Notes: Reports feeling warm but did not check temperature. Denies nausea and chills.   Vital Signs: Today's Vitals   01/30/24 1513  PainSc: 0-No pain  Temp 99.1    Radiographs: [x]  Taken []  Not taken  Surgical Site Assessment:  - Dressing:  [x]  Minimal dry blood, intact []  Reinforced   []  Changed     -Notes: n/a  - Incision:  [x]  CDI (clean, dry, intact)  [x]  Mild erythema  []  Drainage noted   -Notes: n/a  - Swelling:  []  None  [x]  Mild  []  Moderate   []  Significant     -Notes: n/a  - Bruising:  [x]  None  []  Present: n/a   - Sutures/Staples:  []  None [x]  Intact  []  Removed Today  []  Plan to remove at next visit   -Cast/Splint/Pins: [x]  None []  Intact []  Removed Today []  Plan to remove at next visit []  Replaced  -Signs of infection:  [x]  None  []  Present - Describe:  -DME:    []  None []  AFW [x]  Surgical shoe []  Cast  []  Splint  -Walking status:  [x]  Full WB  []  Partial WB  []  NWB  -Utilizing device:  [x]  None []  Knee Scooter []  Crutches []  Wheelchair    DVT assessment:  [x]  Denies symptoms []  Chest pain/SOB []  Pain in calf/redness/warmth   Redressed DSD and ace wrap. Educated on signs of infection, proper dressing care, pain management, and weight bearing status. Patient will contact provider with any new or worsening symptoms. The provider assessed the patient today and reviewed instructions regarding plan of care.

## 2024-02-06 ENCOUNTER — Ambulatory Visit (INDEPENDENT_AMBULATORY_CARE_PROVIDER_SITE_OTHER): Payer: 59

## 2024-02-06 VITALS — Ht 72.0 in | Wt 185.0 lb

## 2024-02-06 DIAGNOSIS — Z0000 Encounter for general adult medical examination without abnormal findings: Secondary | ICD-10-CM

## 2024-02-06 NOTE — Progress Notes (Signed)
 "  Chief Complaint  Patient presents with   Medicare Wellness     Subjective:   Nicholas Whitehead is a 69 y.o. male who presents for a Medicare Annual Wellness Visit.  Visit info / Clinical Intake: Medicare Wellness Visit Type:: Subsequent Annual Wellness Visit Persons participating in visit and providing information:: patient Medicare Wellness Visit Mode:: Telephone If telephone:: video declined Since this visit was completed virtually, some vitals may be partially provided or unavailable. Missing vitals are due to the limitations of the virtual format.: Documented vitals are patient reported If Telephone or Video please confirm:: I connected with patient using audio/video enable telemedicine. I verified patient identity with two identifiers, discussed telehealth limitations, and patient agreed to proceed. Patient Location:: Home Provider Location:: Office Interpreter Needed?: No Pre-visit prep was completed: yes AWV questionnaire completed by patient prior to visit?: no Living arrangements:: (!) lives alone Patient's Overall Health Status Rating: good Typical amount of pain: none Does pain affect daily life?: no Are you currently prescribed opioids?: (!) yes  Dietary Habits and Nutritional Risks How many meals a day?: 3 Eats fruit and vegetables daily?: yes Most meals are obtained by: preparing own meals; eating out In the last 2 weeks, have you had any of the following?: none Diabetic:: (!) yes Any non-healing wounds?: no How often do you check your BS?: 3; as needed (fasting - 157) Would you like to be referred to a Nutritionist or for Diabetic Management? : no  Functional Status Activities of Daily Living (to include ambulation/medication): Independent Ambulation: Independent with device- listed below Home Assistive Devices/Equipment: Cane; Eyeglasses; Dentures (specify type) Medication Administration: Independent Home Management (perform basic housework or laundry):  Independent Manage your own finances?: yes Primary transportation is: family / friends Concerns about vision?: no *vision screening is required for WTM* Concerns about hearing?: no  Fall Screening Falls in the past year?: 0 Number of falls in past year: 0 Was there an injury with Fall?: 0 Fall Risk Category Calculator: 0 Patient Fall Risk Level: Low Fall Risk  Fall Risk Patient at Risk for Falls Due to: No Fall Risks Fall risk Follow up: Falls evaluation completed; Falls prevention discussed  Home and Transportation Safety: All rugs have non-skid backing?: N/A, no rugs All stairs or steps have railings?: N/A, no stairs Grab bars in the bathtub or shower?: yes Have non-skid surface in bathtub or shower?: yes Good home lighting?: yes Regular seat belt use?: yes Hospital stays in the last year:: no  Cognitive Assessment Difficulty concentrating, remembering, or making decisions? : yes Will 6CIT or Mini Cog be Completed: yes What year is it?: 0 points What month is it?: 0 points Give patient an address phrase to remember (5 components): [address] [place], [place] About what time is it?: 0 points Count backwards from 20 to 1: 0 points Say the months of the year in reverse: 0 points Repeat the address phrase from earlier: 0 points 6 CIT Score: 0 points  Advance Directives (For Healthcare) Does Patient Have a Medical Advance Directive?: Yes Does patient want to make changes to medical advance directive?: Yes (Inpatient - patient requests chaplain consult to change a medical advance directive) Type of Advance Directive: Healthcare Power of [place]; Living will Copy of Healthcare Power of Attorney in Chart?: No - copy requested Copy of Living Will in Chart?: No - copy requested Would patient like information on creating a medical advance directive?: Yes (Inpatient - patient requests chaplain consult to create a medical advance directive)  Reviewed/Updated  Reviewed/Updated:  Reviewed All (Medical, Surgical, Family, Medications, Allergies, Care Teams, Patient Goals)    Allergies (verified) Benazepril , Molds & smuts, Pravastatin , and Glipizide   Current Medications (verified) Outpatient Encounter Medications as of 02/06/2024  Medication Sig   ACCU-CHEK GUIDE TEST test strip TABLET BLOOD SUGAR FOUR TIMES DAILY   albuterol  (VENTOLIN  HFA) 108 (90 Base) MCG/ACT inhaler INHALE TWO PUFFS BY MOUTH INTO THE LUNGS EVERY 6 HOURS AS NEEDED FOR SHORTNESS OF BREATH OR WHEEZING   Alcohol Swabs (B-D SINGLE USE SWABS REGULAR) PADS USE AS DIRECTED   ASPIRIN  EC ADULT LOW DOSE 81 MG tablet TAKE ONE TABLET BY MOUTH DAILY AT 9AM   bictegravir-emtricitabine-tenofovir  AF (BIKTARVY) 50-200-25 MG TABS tablet Take 1 tablet by mouth daily.   Blood Glucose Monitoring Suppl (ACCU-CHEK GUIDE) w/Device KIT Use to check blood sugars four times a day   cetirizine (ZYRTEC) 10 MG tablet Take 10 mg by mouth daily.   Cholecalciferol  2000 units TABS Take 1 tablet (2,000 Units total) by mouth daily.   clonazePAM  (KLONOPIN ) 1 MG tablet Oral   Continuous Glucose Sensor (FREESTYLE LIBRE 3 PLUS SENSOR) MISC 1 each by Does not apply route continuous. Change every 15 days.   dapagliflozin  propanediol (FARXIGA ) 10 MG TABS tablet Take 1 tablet (10 mg total) by mouth daily.   Docusate Sodium 100 MG capsule Oral   DULoxetine  (CYMBALTA ) 60 MG capsule TAKE ONE CAPSULE (60 MG) BY MOUTH DAILY AT 9AM   emtricitabine-rilpivir-tenofovir  DF (COMPLERA ) 200-25-300 MG tablet Oral   gabapentin  (NEURONTIN ) 300 MG capsule TAKE ONE CAPSULE (300MG  TOTAL) BY MOUTH THREE TIMES DAILY AT 9AM, 3PM AND 9PM   Glucagon  (GVOKE HYPOPEN  2-PACK) 1 MG/0.2ML SOAJ Inject 1 Act into the skin daily as needed.   Glycopyrrolate -Formoterol  (BEVESPI  AEROSPHERE) 9-4.8 MCG/ACT AERO Inhale 2 puffs into the lungs 2 (two) times daily.   HUMALOG  KWIKPEN 100 UNIT/ML KwikPen INJECT 25-35 UNITS UNDER THE SKIN THREE TIMES DAILY   ibuprofen  (ADVIL ) 800 MG  tablet Take 1 tablet (800 mg total) by mouth every 6 (six) hours as needed.   insulin  aspart (NOVOLOG ) 100 UNIT/ML injection Subcutaneous   Insulin  Pen Needle (TECHLITE PEN NEEDLES) 31G X 8 MM MISC USE TO INJECT INSULIN  AS DIRECTED THREE TIMES DAILY WITH HUMALOG    irbesartan  (AVAPRO ) 300 MG tablet Oral   Lancets MISC Inject 1 Act into the skin 4 (four) times daily. Use to test blood sugar tid. DX: E11.8   mirtazapine  (REMERON ) 7.5 MG tablet TAKE ONE TABLET BY MOUTH DAILY AT 9PM   Nebivolol  HCl (BYSTOLIC ) 20 MG TABS Oral   olmesartan  (BENICAR ) 40 MG tablet TAKE ONE TABLET (40MG ) BY MOUTH DAILY AT 9AM   oxyCODONE -acetaminophen  (PERCOCET) 5-325 MG tablet Take 1 tablet by mouth every 4 (four) hours as needed for severe pain (pain score 7-10).   OZEMPIC , 0.25 OR 0.5 MG/DOSE, 2 MG/3ML SOPN INJECT 0.5 MG SUBCUTANEOUSLY ONCE A WEEK   pioglitazone  (ACTOS ) 30 MG tablet TAKE 1 TABLET(30 MG) BY MOUTH DAILY   Pitavastatin  Calcium  (LIVALO ) 1 MG TABS TAKE ONE TABLET (1 MG) BY MOUTH DAILY AT 9AM   pravastatin  (PRAVACHOL ) 40 MG tablet Oral   sertraline  (ZOLOFT ) 100 MG tablet Oral   sildenafil  (REVATIO ) 20 MG tablet Oral   Suvorexant  (BELSOMRA ) 20 MG TABS TAKE ONE TABLET (20 MG TOTAL) BY MOUTH DAILY AT BEDTIME   torsemide  (DEMADEX ) 20 MG tablet TAKE ONE TABLET BY MOUTH ON MONDAY, WEDNESDAY AND FRIDAY   TOUJEO  SOLOSTAR 300 UNIT/ML Solostar Pen Inject 45  Units into the skin daily.   traZODone  (DESYREL ) 150 MG tablet TAKE THREE TABLETS (450MG  TOTAL) BY MOUTH DAILY AT 9PM AT BEDTIME   triamcinolone  cream (KENALOG ) 0.5 % External   No facility-administered encounter medications on file as of 02/06/2024.    History: Past Medical History:  Diagnosis Date   Anxiety    Cataract    right    Depression    Diabetes mellitus without complication (HCC)    DM (diabetes mellitus screen)    HIV disease (HCC) 09/12/1995   HIV infection (HCC)    Hypercholesterolemia    Hyperlipidemia    Hypertension    Past Surgical  History:  Procedure Laterality Date   COLONOSCOPY WITH PROPOFOL   01/16/2012   Procedure: COLONOSCOPY WITH PROPOFOL ;  Surgeon: Gladis MARLA Louder, MD;  Location: [facility];  Service: Endoscopy;  Laterality: N/A;   EYE SURGERY  01/10/2003   catarct surgery   foot sugery Bilateral 12/29/2022   optic lens surgery  01/10/2003   Family History  Problem Relation Age of Onset   Arthritis Mother    Diabetes Mother    Heart disease Mother    Stroke Mother    Arthritis Father    Diabetes Father    Heart disease Father    Social History   Occupational History   Occupation: disability  Tobacco Use   Smoking status: Every Day    Current packs/day: 0.25    Average packs/day: 0.3 packs/day for 49.1 years (12.3 ttl pk-yrs)    Types: Cigarettes    Start date: 01/10/1975   Smokeless tobacco: Never   Tobacco comments:    Occ smoking/ trying to quit  Vaping Use   Vaping status: Never Used  Substance and Sexual Activity   Alcohol use: No    Alcohol/week: 0.0 standard drinks of alcohol    Comment: socially   Drug use: No   Sexual activity: Not Currently    Birth control/protection: Other-see comments    Comment: declined condoms   Tobacco Counseling Ready to quit: No Counseling given: Yes Tobacco comments: Occ smoking/ trying to quit  SDOH Screenings   Food Insecurity: No Food Insecurity (02/06/2024)  Housing: Unknown (02/06/2024)  Transportation Needs: No Transportation Needs (02/06/2024)  Utilities: Not At Risk (02/06/2024)  Alcohol Screen: Low Risk (02/05/2023)  Depression (PHQ2-9): Medium Risk (02/06/2024)  Financial Resource Strain: Medium Risk (02/05/2023)  Physical Activity: Inactive (02/06/2024)  Social Connections: Moderately Integrated (02/06/2024)  Stress: No Stress Concern Present (02/06/2024)  Tobacco Use: High Risk (02/06/2024)  Health Literacy: Adequate Health Literacy (02/06/2024)   See flowsheets for full screening details  Depression Screen PHQ 2 & 9 Depression Scale-  Over the past 2 weeks, how often have you been bothered by any of the following problems? Little interest or pleasure in doing things: 0 Feeling down, depressed, or hopeless (PHQ Adolescent also includes...irritable): 3 PHQ-2 Total Score: 3 Trouble falling or staying asleep, or sleeping too much: 0 Feeling tired or having little energy: 0 Poor appetite or overeating (PHQ Adolescent also includes...weight loss): 0 Feeling bad about yourself - or that you are a failure or have let yourself or your family down: 0 Trouble concentrating on things, such as reading the newspaper or watching television (PHQ Adolescent also includes...like school work): 0 Moving or speaking so slowly that other people could have noticed. Or the opposite - being so fidgety or restless that you have been moving around a lot more than usual: 3 (toe amputation) Thoughts that you would be  better off dead, or of hurting yourself in some way: 0 PHQ-9 Total Score: 6 If you checked off any problems, how difficult have these problems made it for you to do your work, take care of things at home, or get along with other people?: Not difficult at all  Depression Treatment Depression Interventions/Treatment : Medication; Currently on Treatment     Goals Addressed               This Visit's Progress     Patient Stated (pt-stated)        Patient stated he had surgery 01/2024 of left toe amputation - wants to heal and then begin walking better             Objective:    Today's Vitals   02/06/24 0859  Weight: 185 lb (83.9 kg)  Height: 6' (1.829 m)   Body mass index is 25.09 kg/m.  Hearing/Vision screen Hearing Screening - Comments:: Denies hearing difficulties   Vision Screening - Comments:: Wears rx glasses - up to date with routine eye exams with Adine Haddock Immunizations and Health Maintenance Health Maintenance  Topic Date Due   Zoster Vaccines- Shingrix  (1 of 2) Never done   OPHTHALMOLOGY EXAM   08/30/2023   Colonoscopy  08/19/2024 (Originally 04/25/2022)   Diabetic kidney evaluation - Urine ACR  02/20/2024   FOOT EXAM  03/07/2024   HEMOGLOBIN A1C  06/18/2024   Diabetic kidney evaluation - eGFR measurement  08/19/2024   Medicare Annual Wellness (AWV)  02/05/2025   Pneumococcal Vaccine: 50+ Years (4 of 4 - PCV20 or PCV21) 03/23/2026   DTaP/Tdap/Td (3 - Td or Tdap) 08/21/2033   Influenza Vaccine  Completed   Hepatitis C Screening  Completed   Meningococcal B Vaccine  Aged Out   COVID-19 Vaccine  Discontinued        Assessment/Plan:  This is a routine wellness examination for Nicholas Whitehead.  Ophthalmology status: plans to schedule an appt w/new Provider for a Diabetic Eye Exam (seen Adine Haddock in 2025)  Patient Care Team: Joshua Debby CROME, MD as PCP - General (Internal Medicine) Liddie Kent, LCSW as Counselor [place], Toribio BROCKS, [facility] (Inactive) (Pharmacist) [place], [facility]  I have personally reviewed and noted the following in the patients chart:   Medical and social history Use of alcohol, tobacco or illicit drugs  Current medications and supplements including opioid prescriptions. Functional ability and status Nutritional status Physical activity Advanced directives List of other physicians Hospitalizations, surgeries, and ER visits in previous 12 months Vitals Screenings to include cognitive, depression, and falls Referrals and appointments  No orders of the defined types were placed in this encounter.  In addition, I have reviewed and discussed with patient certain preventive protocols, quality metrics, and best practice recommendations. A written personalized care plan for preventive services as well as general preventive health recommendations were provided to patient.   Verdie CHRISTELLA Saba, CMA   02/06/2024   Return in 1 year (on 02/05/2025).  After Visit Summary: (Declined) Due to this being a telephonic visit, with patients personalized plan  was offered to patient but patient Declined AVS at this time   Nurse Notes: scheduled 6-mth Diabetes f/u w/PCP for 03/2024; scheduled 2027 AWV appt "

## 2024-02-06 NOTE — Patient Instructions (Signed)
 Mr. Clos,  Thank you for taking the time for your Medicare Wellness Visit. I appreciate your continued commitment to your health goals. Please review the care plan we discussed, and feel free to reach out if I can assist you further.  Please note that Annual Wellness Visits do not include a physical exam. Some assessments may be limited, especially if the visit was conducted virtually. If needed, we may recommend an in-person follow-up with your provider.  Ongoing Care Seeing your primary care provider every 3 to 6 months helps us  monitor your health and provide consistent, personalized care.   Referrals If a referral was made during today's visit and you haven't received any updates within two weeks, please contact the referred provider directly to check on the status.  Recommended Screenings:  Health Maintenance  Topic Date Due   Zoster (Shingles) Vaccine (1 of 2) Never done   Eye exam for diabetics  08/30/2023   Colon Cancer Screening  08/19/2024*   Kidney health urinalysis for diabetes  02/20/2024   Complete foot exam   03/07/2024   Hemoglobin A1C  06/18/2024   Yearly kidney function blood test for diabetes  08/19/2024   Medicare Annual Wellness Visit  02/05/2025   Pneumococcal Vaccine for age over 31 (4 of 4 - PCV20 or PCV21) 03/23/2026   DTaP/Tdap/Td vaccine (3 - Td or Tdap) 08/21/2033   Flu Shot  Completed   Hepatitis C Screening  Completed   Meningitis B Vaccine  Aged Out   COVID-19 Vaccine  Discontinued  *Topic was postponed. The date shown is not the original due date.       02/06/2024    9:01 AM  Advanced Directives  Does Patient Have a Medical Advance Directive? Yes  Type of Estate Agent of [place];Living will  Does patient want to make changes to medical advance directive? Yes (Inpatient - patient requests chaplain consult to change a medical advance directive)  Copy of Healthcare Power of Attorney in Chart? No - copy requested  Would  patient like information on creating a medical advance directive? Yes (Inpatient - patient requests chaplain consult to create a medical advance directive)    Vision: Annual vision screenings are recommended for early detection of glaucoma, cataracts, and diabetic retinopathy. These exams can also reveal signs of chronic conditions such as diabetes and high blood pressure.  Dental: Annual dental screenings help detect early signs of oral cancer, gum disease, and other conditions linked to overall health, including heart disease and diabetes.

## 2024-02-13 ENCOUNTER — Ambulatory Visit: Admitting: Podiatry

## 2024-02-13 ENCOUNTER — Telehealth: Payer: Self-pay

## 2024-02-13 ENCOUNTER — Other Ambulatory Visit: Payer: Self-pay | Admitting: Internal Medicine

## 2024-02-13 ENCOUNTER — Encounter

## 2024-02-13 ENCOUNTER — Other Ambulatory Visit (HOSPITAL_COMMUNITY): Payer: Self-pay

## 2024-02-13 DIAGNOSIS — Z89422 Acquired absence of other left toe(s): Secondary | ICD-10-CM

## 2024-02-13 DIAGNOSIS — E785 Hyperlipidemia, unspecified: Secondary | ICD-10-CM

## 2024-02-13 MED ORDER — ATORVASTATIN CALCIUM 10 MG PO TABS: 10.0000 mg | ORAL_TABLET | Freq: Every day | ORAL | 0 refills | Status: AC

## 2024-02-13 NOTE — Telephone Encounter (Signed)
 Pharmacy Patient Advocate Encounter   Received notification from [facility] that prior authorization for Pitavastatin  1 mg tablets is required/requested.   Insurance verification completed.   The patient is insured through [employer].   Per test claim:  Must meet step therapy. Atorvastatin , Ezetimibe/Simvastatin, Lovastatin, Pravastatin , Rosuvastatin, or Amolodipine first is preferred by the insurance.  If suggested medication is appropriate, Please send in a new RX and discontinue this one. If not, please advise as to why it's not appropriate so that we may request a Prior Authorization. Please note, some preferred medications may still require a PA.  If the suggested medications have not been trialed and there are no contraindications to their use, the PA will not be submitted, as it will not be approved. Archived Key: na

## 2024-02-13 NOTE — Telephone Encounter (Signed)
 Please advise.

## 2024-02-13 NOTE — Progress Notes (Unsigned)
 Healed dc

## 2024-02-14 ENCOUNTER — Other Ambulatory Visit: Payer: Self-pay | Admitting: Internal Medicine

## 2024-02-14 DIAGNOSIS — E785 Hyperlipidemia, unspecified: Secondary | ICD-10-CM

## 2024-02-19 ENCOUNTER — Ambulatory Visit: Admitting: Podiatry

## 2024-03-10 ENCOUNTER — Ambulatory Visit: Admitting: Internal Medicine

## 2024-03-18 ENCOUNTER — Ambulatory Visit: Admitting: Internal Medicine

## 2024-03-19 ENCOUNTER — Ambulatory Visit: Admitting: Endocrinology

## 2024-09-11 ENCOUNTER — Encounter: Admitting: Internal Medicine

## 2025-02-06 ENCOUNTER — Ambulatory Visit
